# Patient Record
Sex: Female | Born: 1935 | Race: White | Hispanic: No | Smoking: Never smoker
Health system: Southern US, Community
[De-identification: ages and names within clinical notes are randomized; demographics above are authoritative.]

## PROBLEM LIST (undated history)

## (undated) DIAGNOSIS — I63113 Cerebral infarction due to embolism of bilateral vertebral arteries: Secondary | ICD-10-CM

## (undated) DIAGNOSIS — D649 Anemia, unspecified: Secondary | ICD-10-CM

## (undated) DIAGNOSIS — I639 Cerebral infarction, unspecified: Secondary | ICD-10-CM

## (undated) DIAGNOSIS — M109 Gout, unspecified: Secondary | ICD-10-CM

## (undated) DIAGNOSIS — H3530 Unspecified macular degeneration: Secondary | ICD-10-CM

## (undated) DIAGNOSIS — I10 Essential (primary) hypertension: Secondary | ICD-10-CM

## (undated) DIAGNOSIS — H409 Unspecified glaucoma: Secondary | ICD-10-CM

## (undated) DIAGNOSIS — M1990 Unspecified osteoarthritis, unspecified site: Secondary | ICD-10-CM

## (undated) DIAGNOSIS — H35039 Hypertensive retinopathy, unspecified eye: Secondary | ICD-10-CM

## (undated) DIAGNOSIS — I214 Non-ST elevation (NSTEMI) myocardial infarction: Secondary | ICD-10-CM

## (undated) HISTORY — PX: DILATION AND CURETTAGE OF UTERUS: SHX78

## (undated) HISTORY — PX: BREAST SURGERY: SHX581

## (undated) HISTORY — PX: EYE SURGERY: SHX253

## (undated) HISTORY — DX: Unspecified glaucoma: H40.9

## (undated) HISTORY — PX: HERNIA REPAIR: SHX51

## (undated) HISTORY — PX: JOINT REPLACEMENT: SHX530

## (undated) HISTORY — PX: ABDOMINAL HYSTERECTOMY: SHX81

## (undated) HISTORY — PX: CATARACT EXTRACTION: SUR2

## (undated) HISTORY — PX: IRIDOTOMY / IRIDECTOMY: SHX165

## (undated) HISTORY — DX: Hypertensive retinopathy, unspecified eye: H35.039

## (undated) HISTORY — DX: Unspecified macular degeneration: H35.30

---

## 1998-11-02 ENCOUNTER — Ambulatory Visit (HOSPITAL_COMMUNITY): Admission: RE | Admit: 1998-11-02 | Discharge: 1998-11-02 | Payer: Self-pay | Admitting: Family Medicine

## 1998-11-02 ENCOUNTER — Encounter: Payer: Self-pay | Admitting: Family Medicine

## 1998-11-09 ENCOUNTER — Ambulatory Visit (HOSPITAL_COMMUNITY): Admission: RE | Admit: 1998-11-09 | Discharge: 1998-11-09 | Payer: Self-pay | Admitting: Family Medicine

## 1998-11-13 ENCOUNTER — Other Ambulatory Visit: Admission: RE | Admit: 1998-11-13 | Discharge: 1998-11-13 | Payer: Self-pay | Admitting: Family Medicine

## 2000-01-27 ENCOUNTER — Encounter: Payer: Self-pay | Admitting: Family Medicine

## 2000-01-27 ENCOUNTER — Encounter: Admission: RE | Admit: 2000-01-27 | Discharge: 2000-01-27 | Payer: Self-pay | Admitting: Family Medicine

## 2000-02-15 ENCOUNTER — Other Ambulatory Visit: Admission: RE | Admit: 2000-02-15 | Discharge: 2000-02-15 | Payer: Self-pay | Admitting: Family Medicine

## 2001-03-30 ENCOUNTER — Encounter: Payer: Self-pay | Admitting: Family Medicine

## 2001-03-30 ENCOUNTER — Encounter: Admission: RE | Admit: 2001-03-30 | Discharge: 2001-03-30 | Payer: Self-pay | Admitting: Family Medicine

## 2001-04-27 ENCOUNTER — Encounter: Payer: Self-pay | Admitting: Family Medicine

## 2001-04-27 ENCOUNTER — Encounter: Admission: RE | Admit: 2001-04-27 | Discharge: 2001-04-27 | Payer: Self-pay | Admitting: Family Medicine

## 2002-06-14 ENCOUNTER — Encounter: Admission: RE | Admit: 2002-06-14 | Discharge: 2002-06-14 | Payer: Self-pay | Admitting: Family Medicine

## 2002-06-14 ENCOUNTER — Encounter: Payer: Self-pay | Admitting: Family Medicine

## 2003-05-30 ENCOUNTER — Other Ambulatory Visit: Admission: RE | Admit: 2003-05-30 | Discharge: 2003-05-30 | Payer: Self-pay | Admitting: Family Medicine

## 2004-06-22 ENCOUNTER — Encounter: Admission: RE | Admit: 2004-06-22 | Discharge: 2004-06-22 | Payer: Self-pay | Admitting: Family Medicine

## 2004-06-29 ENCOUNTER — Encounter (INDEPENDENT_AMBULATORY_CARE_PROVIDER_SITE_OTHER): Payer: Self-pay | Admitting: Specialist

## 2004-06-29 ENCOUNTER — Encounter: Admission: RE | Admit: 2004-06-29 | Discharge: 2004-06-29 | Payer: Self-pay | Admitting: Family Medicine

## 2004-12-31 ENCOUNTER — Encounter: Admission: RE | Admit: 2004-12-31 | Discharge: 2004-12-31 | Payer: Self-pay | Admitting: Family Medicine

## 2005-07-15 ENCOUNTER — Encounter: Admission: RE | Admit: 2005-07-15 | Discharge: 2005-07-15 | Payer: Self-pay | Admitting: Family Medicine

## 2006-08-01 ENCOUNTER — Encounter: Admission: RE | Admit: 2006-08-01 | Discharge: 2006-08-01 | Payer: Self-pay | Admitting: Family Medicine

## 2006-10-17 ENCOUNTER — Encounter: Admission: RE | Admit: 2006-10-17 | Discharge: 2006-10-17 | Payer: Self-pay | Admitting: Family Medicine

## 2007-02-15 ENCOUNTER — Encounter: Admission: RE | Admit: 2007-02-15 | Discharge: 2007-02-15 | Payer: Self-pay | Admitting: Family Medicine

## 2007-06-28 ENCOUNTER — Encounter: Admission: RE | Admit: 2007-06-28 | Discharge: 2007-06-28 | Payer: Self-pay | Admitting: Family Medicine

## 2007-08-07 ENCOUNTER — Encounter: Admission: RE | Admit: 2007-08-07 | Discharge: 2007-08-07 | Payer: Self-pay | Admitting: Family Medicine

## 2008-05-15 ENCOUNTER — Encounter: Admission: RE | Admit: 2008-05-15 | Discharge: 2008-05-15 | Payer: Self-pay | Admitting: Family Medicine

## 2008-08-07 ENCOUNTER — Encounter: Admission: RE | Admit: 2008-08-07 | Discharge: 2008-08-07 | Payer: Self-pay | Admitting: Family Medicine

## 2008-12-18 ENCOUNTER — Ambulatory Visit (HOSPITAL_BASED_OUTPATIENT_CLINIC_OR_DEPARTMENT_OTHER): Admission: RE | Admit: 2008-12-18 | Discharge: 2008-12-18 | Payer: Self-pay | Admitting: Orthopedic Surgery

## 2009-08-14 ENCOUNTER — Encounter: Admission: RE | Admit: 2009-08-14 | Discharge: 2009-08-14 | Payer: Self-pay | Admitting: Family Medicine

## 2010-03-29 ENCOUNTER — Inpatient Hospital Stay (HOSPITAL_COMMUNITY): Admission: RE | Admit: 2010-03-29 | Discharge: 2010-04-01 | Payer: Self-pay | Admitting: Orthopedic Surgery

## 2010-08-19 ENCOUNTER — Encounter: Admission: RE | Admit: 2010-08-19 | Discharge: 2010-08-19 | Payer: Self-pay | Admitting: Internal Medicine

## 2010-12-24 ENCOUNTER — Ambulatory Visit (HOSPITAL_BASED_OUTPATIENT_CLINIC_OR_DEPARTMENT_OTHER): Payer: Medicare Other | Attending: Cardiovascular Disease

## 2010-12-24 DIAGNOSIS — G4733 Obstructive sleep apnea (adult) (pediatric): Secondary | ICD-10-CM | POA: Insufficient documentation

## 2010-12-24 DIAGNOSIS — G4737 Central sleep apnea in conditions classified elsewhere: Secondary | ICD-10-CM | POA: Insufficient documentation

## 2010-12-26 LAB — BASIC METABOLIC PANEL
BUN: 16 mg/dL (ref 6–23)
BUN: 17 mg/dL (ref 6–23)
CO2: 26 mEq/L (ref 19–32)
Calcium: 8.4 mg/dL (ref 8.4–10.5)
Chloride: 107 mEq/L (ref 96–112)
Creatinine, Ser: 0.88 mg/dL (ref 0.4–1.2)
Creatinine, Ser: 0.92 mg/dL (ref 0.4–1.2)
GFR calc Af Amer: >60 mL/min (ref >60)
GFR calc Af Amer: >60 mL/min (ref >60)
GFR calc non Af Amer: 58 mL/min — ABNORMAL LOW (ref >60)
GFR calc non Af Amer: >60 mL/min (ref >60)
Sodium: 137 mEq/L (ref 135–145)

## 2010-12-26 LAB — CBC
HCT: 30.3 % — ABNORMAL LOW (ref 36.0–46.0)
Hemoglobin: 10.3 g/dL — ABNORMAL LOW (ref 12.0–15.0)
Hemoglobin: 9.8 g/dL — ABNORMAL LOW (ref 12.0–15.0)
MCHC: 32 g/dL (ref 30.0–36.0)
MCHC: 32.2 g/dL (ref 30.0–36.0)
MCV: 93.9 fL (ref 78.0–100.0)
Platelets: 194 10*3/uL (ref 150–400)
Platelets: 198 10*3/uL (ref 150–400)
Platelets: 205 10*3/uL (ref 150–400)
RDW: 13 % (ref 11.5–15.5)
RDW: 13.3 % (ref 11.5–15.5)
RDW: 13.3 % (ref 11.5–15.5)

## 2010-12-26 LAB — PROTIME-INR
INR: 1.15 (ref 0.00–1.49)
Prothrombin Time: 21.4 seconds — ABNORMAL HIGH (ref 11.6–15.2)

## 2010-12-27 LAB — COMPREHENSIVE METABOLIC PANEL
ALT: 11 U/L (ref 0–35)
Alkaline Phosphatase: 124 U/L — ABNORMAL HIGH (ref 39–117)
BUN: 23 mg/dL (ref 6–23)
CO2: 30 mEq/L (ref 19–32)
GFR calc non Af Amer: 55 mL/min — ABNORMAL LOW (ref >60)
Glucose, Bld: 81 mg/dL (ref 70–99)
Potassium: 3.9 mEq/L (ref 3.5–5.1)
Sodium: 140 mEq/L (ref 135–145)
Total Bilirubin: 0.7 mg/dL (ref 0.3–1.2)
Total Protein: 7.4 g/dL (ref 6.0–8.3)

## 2010-12-27 LAB — PROTIME-INR
INR: 1.03 (ref 0.00–1.49)
Prothrombin Time: 13.4 seconds (ref 11.6–15.2)

## 2010-12-27 LAB — URINALYSIS, ROUTINE W REFLEX MICROSCOPIC
Bilirubin Urine: NEGATIVE
Glucose, UA: NEGATIVE mg/dL
Hgb urine dipstick: NEGATIVE
Ketones, ur: NEGATIVE mg/dL
Protein, ur: NEGATIVE mg/dL
Urobilinogen, UA: 0.2 mg/dL (ref 0.0–1.0)

## 2010-12-27 LAB — CBC
HCT: 41.7 % (ref 36.0–46.0)
MCHC: 34.2 g/dL (ref 30.0–36.0)
Platelets: 248 10*3/uL (ref 150–400)
RDW: 12.8 % (ref 11.5–15.5)

## 2010-12-27 LAB — SURGICAL PCR SCREEN: Staphylococcus aureus: NEGATIVE

## 2011-01-01 DIAGNOSIS — R0609 Other forms of dyspnea: Secondary | ICD-10-CM

## 2011-01-01 DIAGNOSIS — R0989 Other specified symptoms and signs involving the circulatory and respiratory systems: Secondary | ICD-10-CM

## 2011-01-01 DIAGNOSIS — G4733 Obstructive sleep apnea (adult) (pediatric): Secondary | ICD-10-CM

## 2011-01-17 ENCOUNTER — Encounter (HOSPITAL_BASED_OUTPATIENT_CLINIC_OR_DEPARTMENT_OTHER): Payer: Self-pay

## 2011-01-20 LAB — POCT I-STAT 4, (NA,K, GLUC, HGB,HCT)
Glucose, Bld: 98 mg/dL (ref 70–99)
HCT: 46 % (ref 36.0–46.0)
Hemoglobin: 15.6 g/dL — ABNORMAL HIGH (ref 12.0–15.0)
Potassium: 3.9 mEq/L (ref 3.5–5.1)

## 2011-02-22 NOTE — Op Note (Signed)
[name], [name]                 ACCOUNT NO.:  [id number]   MEDICAL RECORD NO.:  [id number]          PATIENT TYPE:  AMB   LOCATION:  [facility]                         FACILITY:  [facility]   PHYSICIAN:  Ollen Gross, M.D.    DATE OF BIRTH:  1923-06-05   DATE OF PROCEDURE:  12/18/2008  DATE OF DISCHARGE:                               OPERATIVE REPORT   PREOPERATIVE DIAGNOSIS:  Left knee medial meniscal tear.   POSTOPERATIVE DIAGNOSIS:  Left knee medial meniscal tear.  Generalized  arthritis.   PROCEDURE:  Left knee arthroscopy with meniscal debridement.   SURGEON:  Dr. Lequita Halt, no assistant.   ANESTHESIA:  General.   ESTIMATED BLOOD LOSS:  Minimal.   DRAIN:  None.   COMPLICATIONS:  None.   CONDITION:  Stable to recovery.   BRIEF CLINICAL NOTE:  [name] is an 77 year old female with a long  history of progressively worsening left knee pain and mechanical  symptoms.  She had some arthritis in a joint but had a medial meniscal  tear and MRI as well as significant stress reaction.  We gave it 2  months for the stress reaction to heal, and it got better, but then her  pain has recurred with worsening mechanical symptoms.  She presents now  for arthroscopy and debridement.   PROCEDURE IN DETAIL:  After successful administration of general  anesthetic, a tourniquet was placed high on the left thigh and left  lower extremity prepped and draped in the usual sterile fashion.  Standard superomedial and inferolateral incisions were made with the  inflow cannula passed superomedial, camera passed inferolateral.  Arthroscopic visualization proceeds.  Undersurface patella shows grade 2  change centrally and medially with grade 4 change laterally on the  patella and the trochlea.  Medial and lateral gutters were visualized  with no loose bodies.  Flexion valgus applied to the knee and the medial  compartment was entered.  She has exposed bone on the medial femoral  condyle and medial tibial  plateau with surrounding grade 3  chondromalacia.  There was no unstable cartilage.  She does have a tear  in the body and posterior horn of the medial meniscus.  A spinal needle  was used to localize the inferomedial portal, small incision made,  dilator placed and I debrided the meniscus back to a stable base with  baskets and a 4.2-mm shaver.  We debrided some hypertrophic synovium in  the intercondylar area as well as suprapatellar area with the  ArthroCare.  Intercondylar notch was visualized.  The ACL was normal.  Lateral compartment was entered and it looks normal.  I further debrided  the hypertrophic tissue superiorly in the suprapatellar pouch.  The  joint was again inspected and no tears or loose bodies noted.  Arthroscopic equipment was  removed from the inferior portals which closed with interrupted 4-0  nylon.  Twenty mL of 0.25% Marcaine with epinephrine injected through  the inflow cannula, and that is removed and that portal closed with  nylon.  Bulky sterile dressing was applied, and she is awakened and  transferred to  recovery in stable condition.      Ollen Gross, M.D.  Electronically Signed     FA/MEDQ  D:  12/18/2008  T:  12/18/2008  Job:  [id number]

## 2011-07-12 ENCOUNTER — Other Ambulatory Visit: Payer: Self-pay | Admitting: Internal Medicine

## 2011-07-12 DIAGNOSIS — Z1231 Encounter for screening mammogram for malignant neoplasm of breast: Secondary | ICD-10-CM

## 2011-08-23 ENCOUNTER — Ambulatory Visit
Admission: RE | Admit: 2011-08-23 | Discharge: 2011-08-23 | Disposition: A | Discharge status: Home / Self Care | Payer: Medicare Other | Source: Ambulatory Visit | Attending: Internal Medicine | Admitting: Internal Medicine

## 2011-08-23 DIAGNOSIS — Z1231 Encounter for screening mammogram for malignant neoplasm of breast: Secondary | ICD-10-CM

## 2012-08-16 ENCOUNTER — Other Ambulatory Visit: Payer: Self-pay | Admitting: Internal Medicine

## 2012-08-16 DIAGNOSIS — Z1231 Encounter for screening mammogram for malignant neoplasm of breast: Secondary | ICD-10-CM

## 2012-09-21 ENCOUNTER — Emergency Department (HOSPITAL_COMMUNITY): Payer: Medicare Other

## 2012-09-21 ENCOUNTER — Emergency Department (HOSPITAL_COMMUNITY)
Admission: EM | Admit: 2012-09-21 | Discharge: 2012-09-21 | Disposition: A | Discharge status: Home / Self Care | Payer: Medicare Other | Attending: Emergency Medicine | Admitting: Emergency Medicine

## 2012-09-21 DIAGNOSIS — S99929A Unspecified injury of unspecified foot, initial encounter: Secondary | ICD-10-CM | POA: Insufficient documentation

## 2012-09-21 DIAGNOSIS — R29810 Facial weakness: Secondary | ICD-10-CM | POA: Insufficient documentation

## 2012-09-21 DIAGNOSIS — Y939 Activity, unspecified: Secondary | ICD-10-CM | POA: Insufficient documentation

## 2012-09-21 DIAGNOSIS — S8990XA Unspecified injury of unspecified lower leg, initial encounter: Secondary | ICD-10-CM | POA: Insufficient documentation

## 2012-09-21 DIAGNOSIS — S0010XA Contusion of unspecified eyelid and periocular area, initial encounter: Secondary | ICD-10-CM | POA: Insufficient documentation

## 2012-09-21 DIAGNOSIS — R269 Unspecified abnormalities of gait and mobility: Secondary | ICD-10-CM | POA: Insufficient documentation

## 2012-09-21 DIAGNOSIS — W19XXXA Unspecified fall, initial encounter: Secondary | ICD-10-CM

## 2012-09-21 DIAGNOSIS — Z79899 Other long term (current) drug therapy: Secondary | ICD-10-CM | POA: Insufficient documentation

## 2012-09-21 DIAGNOSIS — Y929 Unspecified place or not applicable: Secondary | ICD-10-CM | POA: Insufficient documentation

## 2012-09-21 DIAGNOSIS — Z7982 Long term (current) use of aspirin: Secondary | ICD-10-CM | POA: Insufficient documentation

## 2012-09-21 DIAGNOSIS — I639 Cerebral infarction, unspecified: Secondary | ICD-10-CM

## 2012-09-21 DIAGNOSIS — N390 Urinary tract infection, site not specified: Secondary | ICD-10-CM | POA: Insufficient documentation

## 2012-09-21 DIAGNOSIS — W1789XA Other fall from one level to another, initial encounter: Secondary | ICD-10-CM | POA: Insufficient documentation

## 2012-09-21 LAB — CBC WITH DIFFERENTIAL/PLATELET
Basophils Absolute: 0 10*3/uL (ref 0.0–0.1)
Eosinophils Relative: 0 % (ref 0–5)
Lymphocytes Relative: 22 % (ref 12–46)
MCV: 89.2 fL (ref 78.0–100.0)
Neutrophils Relative %: 72 % (ref 43–77)
Platelets: 329 10*3/uL (ref 150–400)
RBC: 4.71 MIL/uL (ref 3.87–5.11)
RDW: 13.4 % (ref 11.5–15.5)
WBC: 12 10*3/uL — ABNORMAL HIGH (ref 4.0–10.5)

## 2012-09-21 LAB — URINALYSIS, ROUTINE W REFLEX MICROSCOPIC
Bilirubin Urine: NEGATIVE
Hgb urine dipstick: NEGATIVE
Specific Gravity, Urine: 1.021 (ref 1.005–1.030)
pH: 5 (ref 5.0–8.0)

## 2012-09-21 LAB — COMPREHENSIVE METABOLIC PANEL
ALT: 13 U/L (ref 0–35)
AST: 24 U/L (ref 0–37)
Alkaline Phosphatase: 101 U/L (ref 39–117)
CO2: 25 mEq/L (ref 19–32)
Calcium: 9.9 mg/dL (ref 8.4–10.5)
GFR calc non Af Amer: 40 mL/min — ABNORMAL LOW (ref >90)
Potassium: 4.1 mEq/L (ref 3.5–5.1)
Sodium: 139 mEq/L (ref 135–145)
Total Protein: 7.3 g/dL (ref 6.0–8.3)

## 2012-09-21 LAB — URINE MICROSCOPIC-ADD ON

## 2012-09-21 MED ORDER — SULFAMETHOXAZOLE-TRIMETHOPRIM 800-160 MG PO TABS: 1.0000 | ORAL_TABLET | Freq: Two times a day (BID) | ORAL | Status: DC

## 2012-09-21 MED ORDER — SULFAMETHOXAZOLE-TMP DS 800-160 MG PO TABS
1.0000 | ORAL_TABLET | Freq: Once | ORAL | Status: AC
  Administered 2012-09-21: 1 via ORAL
  Filled 2012-09-21: qty 1

## 2012-09-21 NOTE — ED Provider Notes (Signed)
History     CSN: [id number]  Arrival date & time 09/21/12  1132   First MD Initiated Contact with Patient 09/21/12 1236      No chief complaint on file.   (Consider location/radiation/quality/duration/timing/severity/associated sxs/prior treatment) HPI The patient presents after a series of falls.  Prior to these episodes she was in her usual state of health, functional.  Over the past 3 days she's had several falls.  She recalls all events, denies any prodrome during these episodes.  She states that she lost her balance several times.  Prominently, 2 days ago, she landed on her face.  She subsequently fell on her backside and the left knee.  Currently she complains of pain in her left knee, denies other focal pain, disorientation, confusion, weakness.  The patient's daughter provides much of the history of present illness. The family reports concern of possible left-sided facial droop.  The patient herself denies dysarthria, ataxia, weakness.  No past medical history on file.  No past surgical history on file.  No family history on file.  History  Substance Use Topics  . Smoking status: Not on file  . Smokeless tobacco: Not on file  . Alcohol Use: Not on file    OB History    No data available      Review of Systems  Constitutional:       Per HPI, otherwise negative  HENT:       Per HPI, otherwise negative  Eyes: Negative.   Respiratory:       Per HPI, otherwise negative  Cardiovascular:       Per HPI, otherwise negative  Gastrointestinal: Negative for nausea and vomiting.  Genitourinary: Negative.   Musculoskeletal:       Per HPI, otherwise negative  Skin: Positive for wound.  Neurological: Negative for syncope, weakness, light-headedness and headaches.    Allergies  Review of patient's allergies indicates no known allergies.  Home Medications   Current Outpatient Rx  Name  Route  Sig  Dispense  Refill  . ALENDRONATE SODIUM 70 MG PO TABS   Oral   Take  70 mg by mouth every 7 (seven) days. Take with a full glass of water on an empty stomach. On Mondays         . ASPIRIN 81 MG PO TABS   Oral   Take 81 mg by mouth daily.         Marland Kitchen CALTRATE 600+D PO   Oral   Take 1 tablet by mouth 2 (two) times daily.         . FUROSEMIDE 40 MG PO TABS   Oral   Take 40 mg by mouth daily.         Marland Kitchen LOSARTAN POTASSIUM 100 MG PO TABS   Oral   Take 100 mg by mouth daily.         Marland Kitchen METOPROLOL TARTRATE 50 MG PO TABS   Oral   Take 25 mg by mouth 2 (two) times daily.         Frazier Butt OP   Ophthalmic   Apply 1 drop to eye 2 (two) times daily.         Marland Kitchen SIMVASTATIN 20 MG PO TABS   Oral   Take 20 mg by mouth every evening.         . TRIAMCINOLONE ACETONIDE 0.1 % EX CREA   Topical   Apply 1 application topically daily as needed. For rash  BP 136/77  Pulse 73  Temp 98.7 F (37.1 C)  Resp 16  SpO2 99%  Physical Exam  Nursing note and vitals reviewed. Constitutional: She is oriented to person, place, and time. She appears well-developed and well-nourished. No distress.  HENT:  Head: Normocephalic and atraumatic.    Eyes: Conjunctivae normal and EOM are normal. Pupils are equal, round, and reactive to light.  Neck: No spinous process tenderness and no muscular tenderness present. No erythema and normal range of motion present.  Cardiovascular: Normal rate and regular rhythm.   Pulmonary/Chest: Effort normal and breath sounds normal. No stridor. No respiratory distress.  Abdominal: She exhibits no distension.  Musculoskeletal: She exhibits no edema.       Legs: Neurological: She is alert and oriented to person, place, and time. No cranial nerve deficit.  Skin: Skin is warm and dry.  Psychiatric: She has a normal mood and affect.    ED Course  Procedures (including critical care time)   Labs Reviewed  CBC WITH DIFFERENTIAL  COMPREHENSIVE METABOLIC PANEL  URINALYSIS, ROUTINE W REFLEX MICROSCOPIC   Ct Head  Wo Contrast  09/21/2012  *RADIOLOGY REPORT*  Clinical Data:  Injury after fall.  CT HEAD WITHOUT CONTRAST CT MAXILLOFACIAL WITHOUT CONTRAST  Technique:  Multidetector CT imaging of the head and maxillofacial structures were performed using the standard protocol without intravenous contrast. Multiplanar CT image reconstructions of the maxillofacial structures were also generated.  Comparison:   None.  CT HEAD  Findings: Bony calvarium appears intact.  Mild diffuse cortical atrophy is noted. Low density is seen involving right periventricular white matter most consistent with old infarction. No mass effect or midline shift is noted.  Ventricular size is within normal limits. There is no evidence of mass lesion, hemorrhage or acute infarction.  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  No acute intracranial abnormality seen.  CT MAXILLOFACIAL  Findings:   No fracture or other bony abnormality is noted. Paranasal sinuses appear normal.  Globes and orbits appear normal.  IMPRESSION: No abnormalities seen in the maxillofacial region.   Original Report Authenticated By: Lupita Raider.,  M.D.    Ct Maxillofacial Wo Cm  09/21/2012  *RADIOLOGY REPORT*  Clinical Data:  Injury after fall.  CT HEAD WITHOUT CONTRAST CT MAXILLOFACIAL WITHOUT CONTRAST  Technique:  Multidetector CT imaging of the head and maxillofacial structures were performed using the standard protocol without intravenous contrast. Multiplanar CT image reconstructions of the maxillofacial structures were also generated.  Comparison:   None.  CT HEAD  Findings: Bony calvarium appears intact.  Mild diffuse cortical atrophy is noted. Low density is seen involving right periventricular white matter most consistent with old infarction. No mass effect or midline shift is noted.  Ventricular size is within normal limits. There is no evidence of mass lesion, hemorrhage or acute infarction.  IMPRESSION: Probable old  infarction involving right periventricular white matter.  No acute intracranial abnormality seen.  CT MAXILLOFACIAL  Findings:   No fracture or other bony abnormality is noted. Paranasal sinuses appear normal.  Globes and orbits appear normal.  IMPRESSION: No abnormalities seen in the maxillofacial region.   Original Report Authenticated By: Lupita Raider.,  M.D.      No diagnosis found.  O2- 99%ra, normal  Cardiac:75sr, normal   Date: 09/21/2012  Rate: 70  Rhythm: normal sinus rhythm  QRS Axis: normal  Intervals: normal  ST/T Wave abnormalities: normal  Conduction Disutrbances:none  Narrative Interpretation:   Old EKG  Reviewed: unchanged Minimal changes - unremarkable ecg  4:15 PM On discussing today's results, the patient's family members requested in evaluation of questionable new left facial droop.  They had this has been present for 3 days.  The patient has minimal asymmetry of her face.  We discussed possible causes, including stroke, facial neuropathy, diaskesis.  The patient requested a discharge with additional evaluation to occur as an outpatient.   MDM  This elderly female presents after a series of falls.  On exam she is in no distress.  The patient is neurologically intact aside from a minor left facial droop.  Given her falls, or facial trauma she had radiographic studies.  These were largely reassuring.  The patient's labs demonstrate evidence of a urinary tract infection.  We discussed this, the need for additional consideration of her possible left facial droop.  Acknowledging that this may occurred after prior infarct, recommended MRI.  The patient elected to do this as an outpatient.  In an effort to empirically treat for possible stroke, we increased her aspirin from 81-325 mg.  Absent distress, and with the patient's request to depart, she was discharged        Gerhard Munch, MD 09/21/12 [phone]

## 2012-09-21 NOTE — ED Notes (Signed)
Pt moved from bed into wheelchair, changed into a gown.  No bruising noted to pts back.  Noted to have a bruised (L) eye and forehead.  Reports falling 4-5 times since Wednesday.  Reports that she lost her balance.  Denies loosing consciousness during any of the falls.  Family reports speech has been slurred and she has been disorientated since [place] today.

## 2012-09-21 NOTE — ED Notes (Signed)
Bumped her head several times she states has left facial droop all since wed

## 2012-09-21 NOTE — ED Notes (Signed)
Fell on wed hit her left eye states lost her balance. Took a trip on Thursday friends thinks she was slurring her speech then and trouble lifting left knee.. Seemed disoriented  To friends in Tuesday. Had gout rt knee last week

## 2012-09-24 LAB — URINE CULTURE

## 2012-09-25 ENCOUNTER — Other Ambulatory Visit: Payer: Self-pay | Admitting: Internal Medicine

## 2012-09-25 ENCOUNTER — Inpatient Hospital Stay (HOSPITAL_COMMUNITY)
Admission: EM | Admit: 2012-09-25 | Discharge: 2012-09-28 | DRG: 065 | Disposition: A | Discharge status: Home Health | Payer: Medicare Other | Attending: Internal Medicine | Admitting: Internal Medicine

## 2012-09-25 ENCOUNTER — Encounter (HOSPITAL_COMMUNITY): Payer: Self-pay | Admitting: Physical Medicine and Rehabilitation

## 2012-09-25 ENCOUNTER — Ambulatory Visit
Admission: RE | Admit: 2012-09-25 | Discharge: 2012-09-25 | Disposition: A | Discharge status: Home / Self Care | Payer: Medicare Other | Source: Ambulatory Visit | Attending: Internal Medicine | Admitting: Internal Medicine

## 2012-09-25 DIAGNOSIS — N289 Disorder of kidney and ureter, unspecified: Secondary | ICD-10-CM | POA: Diagnosis present

## 2012-09-25 DIAGNOSIS — S0010XA Contusion of unspecified eyelid and periocular area, initial encounter: Secondary | ICD-10-CM | POA: Diagnosis present

## 2012-09-25 DIAGNOSIS — Z7982 Long term (current) use of aspirin: Secondary | ICD-10-CM

## 2012-09-25 DIAGNOSIS — N390 Urinary tract infection, site not specified: Secondary | ICD-10-CM | POA: Diagnosis present

## 2012-09-25 DIAGNOSIS — Z23 Encounter for immunization: Secondary | ICD-10-CM

## 2012-09-25 DIAGNOSIS — Y92009 Unspecified place in unspecified non-institutional (private) residence as the place of occurrence of the external cause: Secondary | ICD-10-CM

## 2012-09-25 DIAGNOSIS — Z79899 Other long term (current) drug therapy: Secondary | ICD-10-CM

## 2012-09-25 DIAGNOSIS — S0003XA Contusion of scalp, initial encounter: Secondary | ICD-10-CM | POA: Diagnosis present

## 2012-09-25 DIAGNOSIS — I6330 Cerebral infarction due to thrombosis of unspecified cerebral artery: Principal | ICD-10-CM | POA: Diagnosis present

## 2012-09-25 DIAGNOSIS — M109 Gout, unspecified: Secondary | ICD-10-CM | POA: Diagnosis present

## 2012-09-25 DIAGNOSIS — R29810 Facial weakness: Secondary | ICD-10-CM

## 2012-09-25 DIAGNOSIS — I639 Cerebral infarction, unspecified: Secondary | ICD-10-CM

## 2012-09-25 DIAGNOSIS — E785 Hyperlipidemia, unspecified: Secondary | ICD-10-CM | POA: Diagnosis present

## 2012-09-25 DIAGNOSIS — W19XXXA Unspecified fall, initial encounter: Secondary | ICD-10-CM | POA: Diagnosis present

## 2012-09-25 DIAGNOSIS — M129 Arthropathy, unspecified: Secondary | ICD-10-CM | POA: Diagnosis present

## 2012-09-25 DIAGNOSIS — IMO0002 Reserved for concepts with insufficient information to code with codable children: Secondary | ICD-10-CM

## 2012-09-25 DIAGNOSIS — Z8673 Personal history of transient ischemic attack (TIA), and cerebral infarction without residual deficits: Secondary | ICD-10-CM | POA: Diagnosis present

## 2012-09-25 DIAGNOSIS — S1093XA Contusion of unspecified part of neck, initial encounter: Secondary | ICD-10-CM | POA: Diagnosis present

## 2012-09-25 DIAGNOSIS — I672 Cerebral atherosclerosis: Secondary | ICD-10-CM | POA: Diagnosis present

## 2012-09-25 DIAGNOSIS — E875 Hyperkalemia: Secondary | ICD-10-CM | POA: Diagnosis present

## 2012-09-25 DIAGNOSIS — I10 Essential (primary) hypertension: Secondary | ICD-10-CM | POA: Diagnosis present

## 2012-09-25 HISTORY — DX: Essential (primary) hypertension: I10

## 2012-09-25 HISTORY — DX: Cerebral infarction, unspecified: I63.9

## 2012-09-25 HISTORY — DX: Anemia, unspecified: D64.9

## 2012-09-25 HISTORY — DX: Unspecified osteoarthritis, unspecified site: M19.90

## 2012-09-25 HISTORY — DX: Gout, unspecified: M10.9

## 2012-09-25 MED ORDER — SODIUM CHLORIDE 0.9 % IV SOLN
INTRAVENOUS | Status: DC
  Administered 2012-09-26: via INTRAVENOUS

## 2012-09-25 NOTE — ED Notes (Signed)
Pt A.O.x4. NAD. Respirations even and regular vitals stable. Was seen here at [facility] on Friday post fall for stroke like symptoms. Purple and blue discoloration around left eye related to fall on Friday.  Pt reports a second fall on Monday at home. States she fell on her knees. Denies injury to head. Reports mild bilateral knee pain. Left worse than the right related to fall yesterday. Reports having MRI at [facility] imaging done today. Daughters at bedside. NAD.

## 2012-09-25 NOTE — ED Notes (Signed)
Spoke with Dr. Judd Lien about possible further orders, none at the time. Will continue to monitor patient.

## 2012-09-25 NOTE — ED Notes (Addendum)
Pt presents to department for evaluation of stroke. States she fell last Wednesday, was seen for fall here on Friday, discharged home with follow up appointment for MRI. States she had MRI done today, shows acute/subacute infarct. Pt denies pain at the time. Strong equal grip strengths bilaterally, no slurred speech, ambulatory to triage. She is alert and oriented x4. No acute signs of distress noted.

## 2012-09-26 ENCOUNTER — Encounter (HOSPITAL_COMMUNITY): Payer: Self-pay | Admitting: *Deleted

## 2012-09-26 ENCOUNTER — Ambulatory Visit: Payer: Medicare Other

## 2012-09-26 DIAGNOSIS — W19XXXA Unspecified fall, initial encounter: Secondary | ICD-10-CM

## 2012-09-26 DIAGNOSIS — I639 Cerebral infarction, unspecified: Secondary | ICD-10-CM | POA: Diagnosis present

## 2012-09-26 DIAGNOSIS — I6789 Other cerebrovascular disease: Secondary | ICD-10-CM

## 2012-09-26 DIAGNOSIS — I69992 Facial weakness following unspecified cerebrovascular disease: Secondary | ICD-10-CM

## 2012-09-26 DIAGNOSIS — E875 Hyperkalemia: Secondary | ICD-10-CM | POA: Diagnosis present

## 2012-09-26 DIAGNOSIS — N390 Urinary tract infection, site not specified: Secondary | ICD-10-CM | POA: Diagnosis present

## 2012-09-26 DIAGNOSIS — Z8673 Personal history of transient ischemic attack (TIA), and cerebral infarction without residual deficits: Secondary | ICD-10-CM | POA: Diagnosis present

## 2012-09-26 DIAGNOSIS — Y92009 Unspecified place in unspecified non-institutional (private) residence as the place of occurrence of the external cause: Secondary | ICD-10-CM

## 2012-09-26 DIAGNOSIS — I6350 Cerebral infarction due to unspecified occlusion or stenosis of unspecified cerebral artery: Secondary | ICD-10-CM

## 2012-09-26 DIAGNOSIS — IMO0002 Reserved for concepts with insufficient information to code with codable children: Secondary | ICD-10-CM

## 2012-09-26 DIAGNOSIS — I10 Essential (primary) hypertension: Secondary | ICD-10-CM | POA: Diagnosis present

## 2012-09-26 HISTORY — DX: Unspecified place in unspecified non-institutional (private) residence as the place of occurrence of the external cause: Y92.009

## 2012-09-26 HISTORY — DX: Unspecified place in unspecified non-institutional (private) residence as the place of occurrence of the external cause: W19.XXXA

## 2012-09-26 LAB — LIPID PANEL
Cholesterol: 169 mg/dL (ref 0–200)
Triglycerides: 131 mg/dL (ref <150)
VLDL: 26 mg/dL (ref 0–40)

## 2012-09-26 LAB — CBC
HCT: 36.9 % (ref 36.0–46.0)
Platelets: 284 10*3/uL (ref 150–400)
RDW: 13.7 % (ref 11.5–15.5)
WBC: 10 10*3/uL (ref 4.0–10.5)

## 2012-09-26 LAB — POCT I-STAT, CHEM 8
BUN: 31 mg/dL — ABNORMAL HIGH (ref 6–23)
Calcium, Ion: 1.23 mmol/L (ref 1.13–1.30)
Creatinine, Ser: 1.5 mg/dL — ABNORMAL HIGH (ref 0.50–1.10)
Glucose, Bld: 111 mg/dL — ABNORMAL HIGH (ref 70–99)
Hemoglobin: 12.6 g/dL (ref 12.0–15.0)
TCO2: 21 mmol/L (ref 0–100)

## 2012-09-26 LAB — COMPREHENSIVE METABOLIC PANEL
ALT: 12 U/L (ref 0–35)
AST: 20 U/L (ref 0–37)
Albumin: 3.4 g/dL — ABNORMAL LOW (ref 3.5–5.2)
Alkaline Phosphatase: 81 U/L (ref 39–117)
BUN: 29 mg/dL — ABNORMAL HIGH (ref 6–23)
Chloride: 106 mEq/L (ref 96–112)
Potassium: 5.3 mEq/L — ABNORMAL HIGH (ref 3.5–5.1)
Sodium: 138 mEq/L (ref 135–145)
Total Bilirubin: 0.2 mg/dL — ABNORMAL LOW (ref 0.3–1.2)

## 2012-09-26 LAB — HEMOGLOBIN A1C
Hgb A1c MFr Bld: 6.3 % — ABNORMAL HIGH (ref <5.7)
Mean Plasma Glucose: 134 mg/dL — ABNORMAL HIGH (ref <117)

## 2012-09-26 MED ORDER — SODIUM CHLORIDE 0.9 % IV SOLN: 250.0000 mL | INTRAVENOUS | Status: DC | PRN

## 2012-09-26 MED ORDER — METOPROLOL TARTRATE 25 MG PO TABS: 25.0000 mg | ORAL_TABLET | Freq: Two times a day (BID) | ORAL | Status: DC

## 2012-09-26 MED ORDER — SODIUM CHLORIDE 0.9 % IJ SOLN
3.0000 mL | Freq: Two times a day (BID) | INTRAMUSCULAR | Status: DC
  Administered 2012-09-26 – 2012-09-28 (×5): 3 mL via INTRAVENOUS

## 2012-09-26 MED ORDER — ACETAMINOPHEN 325 MG PO TABS
650.0000 mg | ORAL_TABLET | Freq: Four times a day (QID) | ORAL | Status: DC | PRN
  Administered 2012-09-26: 650 mg via ORAL
  Filled 2012-09-26: qty 2
  Filled 2012-09-26: qty 1

## 2012-09-26 MED ORDER — METOPROLOL TARTRATE 25 MG PO TABS
25.0000 mg | ORAL_TABLET | Freq: Two times a day (BID) | ORAL | Status: DC
  Administered 2012-09-27 – 2012-09-28 (×3): 25 mg via ORAL
  Filled 2012-09-26 (×5): qty 1

## 2012-09-26 MED ORDER — LOSARTAN POTASSIUM 50 MG PO TABS: 100.0000 mg | ORAL_TABLET | Freq: Every day | ORAL | Status: DC

## 2012-09-26 MED ORDER — SODIUM CHLORIDE 0.9 % IV SOLN
250.0000 mL | INTRAVENOUS | Status: DC | PRN
  Administered 2012-09-26: 1000 mL via INTRAVENOUS

## 2012-09-26 MED ORDER — METOPROLOL TARTRATE 25 MG PO TABS
25.0000 mg | ORAL_TABLET | Freq: Two times a day (BID) | ORAL | Status: DC
  Administered 2012-09-26: 25 mg via ORAL
  Filled 2012-09-26 (×2): qty 1

## 2012-09-26 MED ORDER — LOSARTAN POTASSIUM 50 MG PO TABS
100.0000 mg | ORAL_TABLET | Freq: Every day | ORAL | Status: DC
  Administered 2012-09-26 – 2012-09-28 (×3): 100 mg via ORAL
  Filled 2012-09-26 (×3): qty 2

## 2012-09-26 MED ORDER — SULFAMETHOXAZOLE-TRIMETHOPRIM 800-160 MG PO TABS: 1.0000 | ORAL_TABLET | Freq: Two times a day (BID) | ORAL | Status: DC

## 2012-09-26 MED ORDER — DEXTROSE 5 % IV SOLN
1.0000 g | Freq: Every day | INTRAVENOUS | Status: DC
  Administered 2012-09-26 – 2012-09-28 (×3): 1 g via INTRAVENOUS
  Filled 2012-09-26 (×4): qty 10

## 2012-09-26 MED ORDER — OXYCODONE HCL 5 MG PO TABS: 5.0000 mg | ORAL_TABLET | Freq: Four times a day (QID) | ORAL | Status: DC | PRN

## 2012-09-26 MED ORDER — SIMVASTATIN 20 MG PO TABS
20.0000 mg | ORAL_TABLET | Freq: Every evening | ORAL | Status: DC
  Administered 2012-09-26 – 2012-09-27 (×2): 20 mg via ORAL
  Filled 2012-09-26 (×3): qty 1

## 2012-09-26 MED ORDER — ASPIRIN 300 MG RE SUPP
300.0000 mg | Freq: Every day | RECTAL | Status: DC
  Filled 2012-09-26 (×3): qty 1

## 2012-09-26 MED ORDER — SULFAMETHOXAZOLE-TMP DS 800-160 MG PO TABS
1.0000 | ORAL_TABLET | Freq: Two times a day (BID) | ORAL | Status: DC
Start: 2012-09-26 — End: 2012-09-26
  Filled 2012-09-26 (×3): qty 1

## 2012-09-26 MED ORDER — ASPIRIN 325 MG PO TABS
325.0000 mg | ORAL_TABLET | Freq: Every day | ORAL | Status: DC
  Administered 2012-09-26 – 2012-09-28 (×3): 325 mg via ORAL
  Filled 2012-09-26 (×3): qty 1

## 2012-09-26 MED ORDER — METOPROLOL TARTRATE 1 MG/ML IV SOLN
2.5000 mg | Freq: Four times a day (QID) | INTRAVENOUS | Status: DC
  Administered 2012-09-26 (×2): 2.5 mg via INTRAVENOUS
  Filled 2012-09-26 (×5): qty 5

## 2012-09-26 MED ORDER — ASPIRIN EC 325 MG PO TBEC: 325.0000 mg | DELAYED_RELEASE_TABLET | Freq: Every day | ORAL | Status: DC

## 2012-09-26 MED ORDER — METOPROLOL TARTRATE 25 MG PO TABS
25.0000 mg | ORAL_TABLET | Freq: Two times a day (BID) | ORAL | Status: DC
  Filled 2012-09-26 (×3): qty 1

## 2012-09-26 MED ORDER — SODIUM CHLORIDE 0.9 % IJ SOLN: 3.0000 mL | INTRAMUSCULAR | Status: DC | PRN

## 2012-09-26 NOTE — Progress Notes (Signed)
[name],PT Acute Rehabilitation [phone] (pager)  

## 2012-09-26 NOTE — Progress Notes (Signed)
  Echocardiogram 2D Echocardiogram has been performed.  [name] [name] 09/26/2012, 12:38 PM

## 2012-09-26 NOTE — Evaluation (Signed)
Physical Therapy Evaluation Patient Details Name: [name] MRN: [id number] DOB: 23-May-1923 Today's Date: 09/26/2012 Time: 5284-1324 PT Time Calculation (min): 30 min  PT Assessment / Plan / Recommendation Clinical Impression  Pt admitted with R CVA, presents with left sided weakness.  Her decr attention to her left demonstrates incr difficulties with mobiltiy, balance, and activity tolerance.  She will benefit from skilled therapy services.  Discussed d/c options with pt and pt's daughter who agree on HHPT.  Will follow in acute setting to address her limitations.     PT Assessment  Patient needs continued PT services    Follow Up Recommendations  Home health PT          Equipment Recommendations  Rolling walker with 5" wheels       Frequency Min 3X/week    Precautions / Restrictions Precautions Precautions: Fall Precaution Comments: 4 falls in last week; 1 additional fall in past year Restrictions Weight Bearing Restrictions: No   Pertinent Vitals/Pain VSS, no pain.       Mobility  Bed Mobility Bed Mobility: Supine to Sit;Sitting - Scoot to Edge of Bed Supine to Sit: 4: Min assist;With rails;HOB elevated Sitting - Scoot to [place] of Bed: 5: Supervision Details for Bed Mobility Assistance: incr time, req min A for completing supine to sit Transfers Transfers: Sit to Stand;Stand to Sit Sit to Stand: 4: Min guard Stand to Sit: 4: Min guard Details for Transfer Assistance: moves quickly; unsteady initially Ambulation/Gait Ambulation/Gait Assistance: 4: Min Environmental consultant (Feet): 400 Feet Assistive device: Rolling walker Ambulation/Gait Assistance Details: vc for RW use and safety.  Pt decr ambulation with head turns and veered to left only upon returning to room.  Gait Pattern: Step-through pattern;Decreased stride length;Trunk flexed Gait velocity: decr compared to baseline, however pt was quick (RW helped to decr speed and improve balance and  safety Stairs: No Wheelchair Mobility Wheelchair Mobility: No Modified Rankin (Stroke Patients Only) Pre-Morbid Rankin Score: No significant disability Modified Rankin: Moderately severe disability           PT Diagnosis: Difficulty walking  PT Problem List: Decreased strength;Decreased range of motion;Decreased activity tolerance;Decreased balance;Decreased mobility;Decreased coordination;Decreased cognition;Decreased knowledge of use of DME;Decreased safety awareness PT Treatment Interventions: DME instruction;Gait training;Stair training;Functional mobility training;Therapeutic activities;Therapeutic exercise;Balance training;Neuromuscular re-education   PT Goals Acute Rehab PT Goals PT Goal Formulation: With patient Time For Goal Achievement: 09/26/12 Potential to Achieve Goals: Good Pt will go Supine/Side to Sit: Independently PT Goal: Supine/Side to Sit - Progress: Goal set today Pt will go Sit to Stand: Independently PT Goal: Sit to Stand - Progress: Goal set today Pt will go Stand to Sit: Independently PT Goal: Stand to Sit - Progress: Goal set today Pt will Transfer Bed to Chair/Chair to Bed: with modified independence PT Transfer Goal: Bed to Chair/Chair to Bed - Progress: Goal set today Pt will Stand: Independently PT Goal: Stand - Progress: Goal set today Pt will Ambulate: >150 feet;with modified independence;with least restrictive assistive device PT Goal: Ambulate - Progress: Goal set today Pt will Go Up / Down Stairs: 3-5 stairs;with modified independence;with rail(s) PT Goal: Up/Down Stairs - Progress: Goal set today  Visit Information  Last PT Received On: 09/26/12 Assistance Needed: +1    Subjective Data  Subjective: I'm tired, I didn't sleep much last night Patient Stated Goal: To return home   Prior Functioning  Home Living Lives With: Alone Available Help at Discharge: Family;Available 24 hours/day Type of Home: House Home Access:  Stairs to  enter [employer] of Steps: 3 Entrance Stairs-Rails: Can reach both Home Layout: One level Bathroom Shower/Tub: Engineer, manufacturing systems: Standard Home Adaptive Equipment: Bedside commode/3-in-1 Prior Function Level of Independence: Independent Able to Take Stairs?: Yes Driving: Yes Vocation: Retired Comments: Per pt's daughter: pt started needing assistance with fully dressing this past week but was still cooking and doing everything else. Communication Communication: No difficulties Dominant Hand: Right    Cognition  Overall Cognitive Status: Impaired Area of Impairment: Safety/judgement;Awareness of errors;Awareness of deficits Arousal/Alertness: Awake/alert Orientation Level: Oriented X4 / Intact Behavior During Session: [employer] for tasks performed Safety/Judgement: Decreased safety judgement for tasks assessed Safety/Judgement - Other Comments: pt moves quickly and has decr awareness of left sided weakness Awareness of Errors: Assistance required to identify errors made;Assistance required to correct errors made Awareness of Errors - Other Comments: pt did well ambulating in hallway, however veered left toward wall when entering room and fell toward left side when standing from bed to turn and go to chair; pt states she knew she would have that difficulty but decided to stand and turn anyway. Awareness of Deficits: decr    Extremity/Trunk Assessment Right Upper Extremity Assessment RUE ROM/Strength/Tone: WFL for tasks assessed Left Upper Extremity Assessment LUE ROM/Strength/Tone: Deficits LUE ROM/Strength/Tone Deficits: decr grip strength Right Lower Extremity Assessment RLE ROM/Strength/Tone: Deficits RLE ROM/Strength/Tone Deficits: grossly 4/5 RLE Sensation: WFL - Light Touch Left Lower Extremity Assessment LLE ROM/Strength/Tone: Deficits LLE ROM/Strength/Tone Deficits: grossly 4-/5; greater weakness apparent during functional tasks such as transfer and  when pt moves quickly during task she was use to doing PTA LLE Sensation: WFL - Light Touch Trunk Assessment Trunk Assessment: Normal   Balance Balance Balance Assessed: Yes Static Sitting Balance Static Sitting - Balance Support: No upper extremity supported;Feet supported Static Sitting - Level of Assistance: 6: Modified independent (Device/Increase time) Static Sitting - Comment/# of Minutes: no difficulties; EOB Dynamic Sitting Balance Dynamic Sitting - Balance Support: Left upper extremity supported;Feet supported;During functional activity Dynamic Sitting - Level of Assistance: 6: Modified independent (Device/Increase time) Dynamic Sitting Balance - Compensations: able to clean self after urinating.   Static Standing Balance Static Standing - Balance Support: No upper extremity supported Static Standing - Level of Assistance: 4: Min assist Static Standing - Comment/# of Minutes: balance testing , several lob req min A to maintain balance Single Leg Stance - Right Leg:  (<5s with hand held A) Single Leg Stance - Left Leg:  (<5s with handheld A) Tandem Stance - Right Leg:  (<5sec ) Tandem Stance - Left Leg:  (<2sec, left lean)  End of Session PT - End of Session Equipment Utilized During Treatment: Gait belt Activity Tolerance: Patient tolerated treatment well Patient left: in chair;with call bell/phone within reach;with family/visitor present Nurse Communication: Mobility status       Sharion Balloon 09/26/2012, 3:07 PM Sharion Balloon, SPT Acute Rehab Services [phone]

## 2012-09-26 NOTE — Progress Notes (Signed)
Utilization review completed.  

## 2012-09-26 NOTE — Consult Note (Signed)
Reason for Consult: Stroke Referring Physician: Sunnie Nielsen  CC: Left sided weakness  History is obtained from:Patient, daughters  HPI: [name] is a 78 y.o. female with a history of several falls last week. She was seen in the ED where it was noticed that she had a left facial droop, and an MRI was suggested, and the patient elected to pursue this as an outpatient. She denies left sided weakness.   LSN: Last Wednesday tpa given: no out of window NIHSS: 2  ROS: A 14 point ROS was performed and is negative except as noted in the HPI.  Past Medical History  Diagnosis Date  . Hypertension     Family History: No history of stroke   Social History: Tob: denies  Exam: Current vital signs: BP 137/62  Pulse 63  Temp 98 F (36.7 C) (Oral)  Resp 17  Ht 5\' 2"  (1.575 m)  Wt 68.55 kg (151 lb 2 oz)  BMI 27.64 kg/m2  SpO2 100% Vital signs in last 24 hours: Temp:  [98 F (36.7 C)] 98 F (36.7 C) (12/17 1806) Pulse Rate:  [63-66] 63  (12/18 0000) Resp:  [16-19] 17  (12/18 0000) BP: (137-141)/(59-65) 137/62 mmHg (12/18 0000) SpO2:  [96 %-100 %] 100 % (12/18 0000) Weight:  [68.55 kg (151 lb 2 oz)] 68.55 kg (151 lb 2 oz) (12/17 1806)  General: in bed, nad CV: RRR Mental Status: Patient is awake, alert, oriented  Immediate and remote memory are intact. Patient is able to give a clear and coherent history. Cranial Nerves: II: Visual Fields are full. Pupils are equal, round, and reactive to light.  Discs are difficult to visualize. III,IV, VI: EOMI without ptosis or diploplia.  V: Facial sensation is symmetric to temperature VII: Facial movement is notable for a left facial droop.  VIII: hearing is intact to voice X: Uvula elevates symmetrically XI: Shoulder shrug is symmetric. XII: tongue is midline without atrophy or fasciculations.  Motor: Tone is normal. Bulk is normal. 5/5 strength was present in her right arm and both legs. She has 4/5 strength in her left arm.   Sensory: Sensation is symmetric to light touch and pin in the arms and legs Deep Tendon Reflexes: 2+ and symmetric in the biceps and patellae.  Cerebellar: FNF consistent with weakness on the left. HKS intact bilaterally Gait: Did not assess due to patient safety concerns   I have reviewed labs in epic and the results pertinent to this consultation are: BMP elevated K, cr No leukocytosis  I have reviewed the images obtained:MRI brain - moderate sized right BG infarct.   Impression: 78 yo F with acute-subacute stroke, likely occurred last week prompting falls.   Recommendations: 1. HgbA1c, fasting lipid panel 2. PT consult, OT consult, Speech consult 3. Echocardiogram 4. Carotid dopplers 5. Prophylactic therapy-Antiplatelet med: Aspirin - dose 325mg   6. Risk factor modification 7. Telemetry monitoring 8. Frequent neuro checks   Ritta Slot, MD [facility] [phone]  If 7pm- 7am, please page neurology on call at [phone].

## 2012-09-26 NOTE — Progress Notes (Signed)
Patient seen and examined. Agree with note by [name], NP. Continue stroke work up.  Peggye Pitt, MD [facility] Pager: [phone]

## 2012-09-26 NOTE — Progress Notes (Signed)
PT Cancellation Note  Patient Details Name: [name] MRN: [id number] DOB: July 22, 1923   Cancelled Treatment:    Reason Eval/Treat Not Completed: Will see pt on 09/27/12, per PT eval and tx order is set to start at that time.  Thank you.    Sharion Balloon 09/26/2012, 8:42 AM Sharion Balloon, SPT Acute Rehab Services [phone]

## 2012-09-26 NOTE — H&P (Signed)
PCP:   [name], MD   Chief Complaint:  [name]  HPI: 78 yo female was seen last week in ED with uti and left facial droop.  Started on abx and outpt mri was arranged for possible cva.  Was placed on asa 324mg  daily.  She had fallen several times last week also and still has some bruising over the left side of her face.  Over the last week her left facial droop has improved.  She was having some slurred speech but this has resolved.  She was also having some left sided weakness but this has also resolved since last week.  Mri today done outpt did show acute cva so pt was sent here from pcp for further evaluation.  She deneis any fevers/n/v/d.  No cp no sob.  No headache no blurred vision.    Review of Systems:  O/w neg  Past Medical History: Past Medical History  Diagnosis Date  . Hypertension    History reviewed. No pertinent past surgical history.  Medications: Prior to Admission medications   Medication Sig Start Date End Date Taking? Authorizing Provider  alendronate (FOSAMAX) 70 MG tablet Take 70 mg by mouth every 7 (seven) days. Take with a full glass of water on an empty stomach. On Mondays   Yes Historical Provider, MD  aspirin 81 MG tablet Take 81 mg by mouth daily.   Yes Historical Provider, MD  Calcium Carbonate-Vitamin D (CALTRATE 600+D PO) Take 1 tablet by mouth 2 (two) times daily.   Yes Historical Provider, MD  furosemide (LASIX) 40 MG tablet Take 40 mg by mouth daily.   Yes Historical Provider, MD  losartan (COZAAR) 100 MG tablet Take 100 mg by mouth daily.   Yes Historical Provider, MD  metoprolol (LOPRESSOR) 50 MG tablet Take 25 mg by mouth 2 (two) times daily.   Yes Historical Provider, MD  Polyethyl Glycol-Propyl Glycol (SYSTANE OP) Apply 1 drop to eye 2 (two) times daily.   Yes Historical Provider, MD  simvastatin (ZOCOR) 20 MG tablet Take 20 mg by mouth every evening.   Yes Historical Provider, MD  sulfamethoxazole-trimethoprim (BACTRIM DS,SEPTRA DS)  800-160 MG per tablet Take 1 tablet by mouth every 12 (twelve) hours. Start date 09/21/12; Duration unknown 09/21/12  Yes Gerhard Munch, MD  triamcinolone cream (KENALOG) 0.1 % Apply 1 application topically daily as needed. For rash   Yes Historical Provider, MD    Allergies:  No Known Allergies  Social History:  reports that she has never smoked. She does not have any smokeless tobacco history on file. She reports that she does not drink alcohol or use illicit drugs.  Family History: History reviewed. No pertinent family history.  Physical Exam: Filed Vitals:   09/25/12 2355 09/26/12 0000 09/26/12 0130 09/26/12 0154  BP: 141/59 137/62 129/58   Pulse: 63 63 67   Temp:    98 F (36.7 C)  TempSrc:      Resp: 19 17 18    Height:      Weight:      SpO2: 96% 100% 99%    General appearance: alert, cooperative and no distress ecchymosis yellowish now left side of face eomi. Neck: no JVD and supple, symmetrical, trachea midline Lungs: clear to auscultation bilaterally Heart: regular rate and rhythm, S1, S2 normal, no murmur, click, rub or gallop Abdomen: soft, non-tender; bowel sounds normal; no masses,  no organomegaly Extremities: extremities normal, atraumatic, no cyanosis or edema Pulses: 2+ and symmetric Skin: Skin color, texture, turgor  normal. No rashes or lesions Neurologic: Grossly normal    Labs on Admission:   [facility] 09/26/12 0015 09/25/12 2357  NA 138 138  K 5.2* 5.3*  CL 110 106  CO2 -- 21  GLUCOSE 111* 112*  BUN 31* 29*  CREATININE 1.50* 1.54*  CALCIUM -- 9.1  MG -- --  PHOS -- --    Basename 09/25/12 2357  AST 20  ALT 12  ALKPHOS 81  BILITOT 0.2*  PROT 6.6  ALBUMIN 3.4*    Basename 09/26/12 0015 09/25/12 2357  WBC -- 10.0  NEUTROABS -- --  HGB 12.6 11.9*  HCT 37.0 36.9  MCV -- 90.9  PLT -- 284    Radiological Exams on Admission: Dg Chest 2 View  09/21/2012  *RADIOLOGY REPORT*  Clinical Data: Fall.  Chest pain.  Hypertension.  CHEST -  2 VIEW  Comparison: 12/18/2008  Findings: Heart size is within normal limits.  Both lungs are clear.  No evidence of pneumothorax or pleural effusion.  No mass or lymphadenopathy identified.  IMPRESSION: No active disease.   Original Report Authenticated By: Myles Rosenthal, M.D.    Ct Head Wo Contrast  09/21/2012  *RADIOLOGY REPORT*  Clinical Data:  Injury after fall.  CT HEAD WITHOUT CONTRAST CT MAXILLOFACIAL WITHOUT CONTRAST  Technique:  Multidetector CT imaging of the head and maxillofacial structures were performed using the standard protocol without intravenous contrast. Multiplanar CT image reconstructions of the maxillofacial structures were also generated.  Comparison:   None.  CT HEAD  Findings: Bony calvarium appears intact.  Mild diffuse cortical atrophy is noted. Low density is seen involving right periventricular white matter most consistent with old infarction. No mass effect or midline shift is noted.  Ventricular size is within normal limits. There is no evidence of mass lesion, hemorrhage or acute infarction.  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  No acute intracranial abnormality seen.  CT MAXILLOFACIAL  Findings:   No fracture or other bony abnormality is noted. Paranasal sinuses appear normal.  Globes and orbits appear normal.  IMPRESSION: No abnormalities seen in the maxillofacial region.   Original Report Authenticated By: Lupita Raider.,  M.D.    Mr Brain Wo Contrast  09/25/2012  *RADIOLOGY REPORT*  Clinical Data: Left facial droop.  Slurred speech.  Fall last week.  MRI HEAD WITHOUT CONTRAST  Technique:  Multiplanar, multiecho pulse sequences of the brain and surrounding structures were obtained according to standard protocol without intravenous contrast.  Comparison: 09/21/2012 CT.  No comparison MR.  Findings:  Moderate size acute/subacute infarct extends from the right lenticular nucleus to the right caudate.  No intracranial hemorrhage.   Mild to moderate small vessel disease type changes.  No hydrocephalus.  No intracranial mass lesion detected on this unenhanced exam.  Mild spinal stenosis with slight cord flattening C2-3 level incompletely assessed on the present exam.  IMPRESSION:  Moderate size acute/subacute infarct extends from the right lenticular nucleus to the right caudate.  No intracranial hemorrhage.  Mild to moderate small vessel disease type changes.  Mild spinal stenosis with slight cord flattening C2-3 level incompletely assessed on the present exam.  The results are presently being called to the referring clinician by [facility] MR technologist with the patient being held for further direction.   Original Report Authenticated By: Lacy Duverney, M.D.    Ct Maxillofacial Wo Cm  09/21/2012  *RADIOLOGY REPORT*  Clinical Data:  Injury after fall.  CT HEAD WITHOUT CONTRAST CT MAXILLOFACIAL WITHOUT CONTRAST  Technique:  Multidetector CT imaging of the head and maxillofacial structures were performed using the standard protocol without intravenous contrast. Multiplanar CT image reconstructions of the maxillofacial structures were also generated.  Comparison:   None.  CT HEAD  Findings: Bony calvarium appears intact.  Mild diffuse cortical atrophy is noted. Low density is seen involving right periventricular white matter most consistent with old infarction. No mass effect or midline shift is noted.  Ventricular size is within normal limits. There is no evidence of mass lesion, hemorrhage or acute infarction.  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  No acute intracranial abnormality seen.  CT MAXILLOFACIAL  Findings:   No fracture or other bony abnormality is noted. Paranasal sinuses appear normal.  Globes and orbits appear normal.  IMPRESSION: No abnormalities seen in the maxillofacial region.   Original Report Authenticated By: Lupita Raider.,  M.D.     Assessment/Plan  78 yo female with  subacute cva left facial droop Principal Problem:  *CVA (cerebral infarction) Active Problems:  Hypertension  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  Fall at home  UTI (lower urinary tract infection)  Admit for completion of neuro w/u.  cva pathway.  Neurology consulted.  Cont bactrim for uti.    Kielee Care A 09/26/2012, 2:10 AM

## 2012-09-26 NOTE — Progress Notes (Signed)
VASCULAR LAB PRELIMINARY  PRELIMINARY  PRELIMINARY  PRELIMINARY  Carotid duplex  completed.    Preliminary report:  Bilateral:  No evidence of hemodynamically significant internal carotid artery stenosis.   Vertebral artery flow is antegrade.      Raylen Ken, RVT 09/26/2012, 11:49 AM

## 2012-09-26 NOTE — Evaluation (Signed)
Speech Language Pathology Evaluation Patient Details Name: [name] MRN: [id number] DOB: March 27, 1923 Today's Date: 09/26/2012 Time: 1000-1025 SLP Time Calculation (min): 25 min  Problem List:  Patient Active Problem List  Diagnosis  . CVA (cerebral infarction)  . Hypertension  . 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  . Fall at home  . UTI (lower urinary tract infection)  . Hyperkalemia  . Acute renal insufficiency   Past Medical History:  Past Medical History  Diagnosis Date  . Hypertension   . Arthritis   . Gout, joint    Past Surgical History:  Past Surgical History  Procedure Date  . Joint replacement left knee  . Eye surgery both   HPI:  78 yo female was seen last week in ED with uti and left facial droop.  Started on abx and outpt mri was arranged for possible cva.  Was placed on asa 324mg  daily.  She had fallen several times last week also and still has some bruising over the left side of her face.  Over the last week her left facial droop has improved.  She was having some slurred speech but this has resolved.  She was also having some left sided weakness but this has also resolved since last week.  Mri today done outpt did show acute cva so pt was sent here from pcp for further evaluation.  She deneis any fevers/n/v/d.  No cp no sob.  No headache no blurred vision.     Assessment / Plan / Recommendation Clinical Impression  Pt presents with signs of decreased airway protection specifically with large straw sips. Pt consumed over 4oz of water via cup without evidence of aspiration though a straw sip resulted in cough x2. Pt consumed solids with trace residual in left buccal cavity, removed with cues for a lingual sweep. Recommend pt initate a regular consistency diet with thin liquids, no straws and basic aspriation precautions. Given evidence of sensation of penetration/aspiration and no signs of aspiration pna despite subacute inract, suspect pt will have minimal aspiration pna  risk if precautions are followed. SLP will follow for tolerance.     SLP Assessment       Follow Up Recommendations       Frequency and Duration min 2x/week      Pertinent Vitals/Pain NA   SLP Goals     SLP Evaluation Prior Functioning      Cognition       Comprehension       Expression     Oral / Motor Oral Motor/Sensory Function Overall Oral Motor/Sensory Function: Impaired Labial ROM: Reduced left Labial Symmetry: Abnormal symmetry left Labial Strength: Reduced Labial Sensation: Reduced Lingual ROM: Reduced left Lingual Symmetry: Within Functional Limits Lingual Strength: Reduced Lingual Sensation: Reduced Facial ROM: Reduced left Facial Symmetry: Within Functional Limits   GO    Harlon Ditty, MA CCC-SLP [phone]  [name] 09/26/2012, 10:36 AM

## 2012-09-26 NOTE — Progress Notes (Signed)
[facility] PROGRESS NOTE  [name] NWG:[id number] DOB: 1923/04/30 DOA: 09/25/2012 PCP: Paula Diego, MD  Assessment/Plan: CVA (cerebral infarction) per OP MRI. Appreciate neuro input. ST/PT/OT pending. Lipids WNL. Echo, carotid doppler pending. Continue asa, statin, BB, ARB. SBP range 120-140. Active Problems:  Acute renal insufficiency: likely related to decreased po intake. Will gently hydrate with IV fluids until passes swallow eval. Recheck in am Hypertension: SBP range 120-140. Holding home lasix for now. Continue ARB, BB. Monitor and manage allowing for permissive HTN.  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: resolved  Fall at home : likely related to #1. Will have PT/OT evaluated/treat. Continue fall precautions UTI (lower urinary tract infection: culture pending. Afebrile. WC WNL. Non-toxic appearing. Rocephin day #1 Hyperkalemia: mild. Likely related to #2 and decreased po intake. Will gently hydrate. Recheck in am.   Code Status: full Family Communication:  Disposition Plan: home when stable. Does live alone with family nearby.    Consultants:  neuro  Procedures:  none  Antibiotics:  Rocephin 09/26/12 >>>  HPI/Subjective: Awake alert. Denies pain/discomfort.   Objective: Filed Vitals:   09/26/12 0130 09/26/12 0154 09/26/12 0232 09/26/12 0441  BP: 129/58  133/67 121/63  Pulse: 67  69 66  Temp:  98 F (36.7 C) 97.7 F (36.5 C) 98.2 F (36.8 C)  TempSrc:   Oral Oral  Resp: 18  17 18   Height:   5\' 2"  (1.575 m)   Weight:   68.675 kg (151 lb 6.4 oz)   SpO2: 99%  100% 98%   No intake or output data in the 24 hours ending 09/26/12 0723 Filed Weights   09/25/12 1806 09/26/12 0232  Weight: 68.55 kg (151 lb 2 oz) 68.675 kg (151 lb 6.4 oz)    Exam:   General:  Awake alert well nourished. Bruising to left eye/cheek. NAD  Cardiovascular: RRR +murmur No LEE PPP  Respiratory: normal effort BSCTAB No wheeze/rhonchi  Abdomen: round, soft +BS non-tender to  palpation  Neuro: facial symmetry, speech clear cranial nerve II-XII intact  Data Reviewed: Basic Metabolic Panel:  Lab 09/26/12 5784 09/25/12 2357 09/21/12 1334  NA 138 138 139  K 5.2* 5.3* 4.1  CL 110 106 102  CO2 -- 21 25  GLUCOSE 111* 112* 82  BUN 31* 29* 40*  CREATININE 1.50* 1.54* 1.18*  CALCIUM -- 9.1 9.9  MG -- -- --  PHOS -- -- --   Liver Function Tests:  Lab 09/25/12 2357 09/21/12 1334  AST 20 24  ALT 12 13  ALKPHOS 81 101  BILITOT 0.2* 0.3  PROT 6.6 7.3  ALBUMIN 3.4* 3.7   No results found for this basename: LIPASE:5,AMYLASE:5 in the last 168 hours No results found for this basename: AMMONIA:5 in the last 168 hours CBC:  Lab 09/26/12 0015 09/25/12 2357 09/21/12 1334  WBC -- 10.0 12.0*  NEUTROABS -- -- 8.7*  HGB 12.6 11.9* 13.9  HCT 37.0 36.9 42.0  MCV -- 90.9 89.2  PLT -- 284 329   Cardiac Enzymes: No results found for this basename: CKTOTAL:5,CKMB:5,CKMBINDEX:5,TROPONINI:5 in the last 168 hours BNP (last 3 results) No results found for this basename: PROBNP:3 in the last 8760 hours CBG: No results found for this basename: GLUCAP:5 in the last 168 hours  Recent Results (from the past 240 hour(s))  URINE CULTURE     Status: Normal   Collection Time   09/21/12  1:55 PM      Component Value Range Status Comment   Specimen Description URINE, CLEAN  CATCH   Final    Special Requests NONE   Final    Culture  Setup Time 09/21/2012 16:13   Final    Colony Count 95,000 COLONIES/ML   Final    Culture ESCHERICHIA COLI   Final    Report Status 09/24/2012 FINAL   Final    Organism ID, Bacteria ESCHERICHIA COLI   Final      Studies: Mr Brain Wo Contrast  09/25/2012  *RADIOLOGY REPORT*  Clinical Data: Left facial droop.  Slurred speech.  Fall last week.  MRI HEAD WITHOUT CONTRAST  Technique:  Multiplanar, multiecho pulse sequences of the brain and surrounding structures were obtained according to standard protocol without intravenous contrast.  Comparison:  09/21/2012 CT.  No comparison MR.  Findings:  Moderate size acute/subacute infarct extends from the right lenticular nucleus to the right caudate.  No intracranial hemorrhage.  Mild to moderate small vessel disease type changes.  No hydrocephalus.  No intracranial mass lesion detected on this unenhanced exam.  Mild spinal stenosis with slight cord flattening C2-3 level incompletely assessed on the present exam.  IMPRESSION:  Moderate size acute/subacute infarct extends from the right lenticular nucleus to the right caudate.  No intracranial hemorrhage.  Mild to moderate small vessel disease type changes.  Mild spinal stenosis with slight cord flattening C2-3 level incompletely assessed on the present exam.  The results are presently being called to the referring clinician by [facility] MR technologist with the patient being held for further direction.   Original Report Authenticated By: [name], M.D.     Scheduled Meds:    . aspirin  300 mg Rectal Daily   Or  . aspirin  325 mg Oral Daily  . cefTRIAXone (ROCEPHIN)  IV  1 g Intravenous QHS  . losartan  100 mg Oral Daily  . metoprolol  2.5 mg Intravenous Q6H  . metoprolol  25 mg Oral BID  . simvastatin  20 mg Oral QPM  . sodium chloride  3 mL Intravenous Q12H   Continuous Infusions:   Principal Problem:  *CVA (cerebral infarction) Active Problems:  Hypertension  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  Fall at home  UTI (lower urinary tract infection)  Hyperkalemia  Acute renal insufficiency    Time spent: 35 minutes    [facility]  [facility]  If 8PM-8AM, please contact night-coverage at [email or web address], password [facility] 09/26/2012, 7:23 AM  LOS: 1 day

## 2012-09-26 NOTE — ED Provider Notes (Signed)
History     CSN: [id number]  Arrival date & time 09/25/12  1738   First MD Initiated Contact with Patient 09/25/12 2356      Chief Complaint  Patient presents with  . Cerebrovascular Accident    (Consider location/radiation/quality/duration/timing/severity/associated sxs/prior treatment) HPI History provided by patient and family. Fell 3 days ago, evaluated in the emergency department, having left facial droop at that time. Diagnosed with UTI and possible stroke discharged home for outpatient MRI. Patient had outpatient MRI today was told that she has had a stroke and to come back to the emergency department. She was having some left-sided weakness initially but that has resolved. She has a persistent left facial droop. No further falls. Left knee gout last week and having trouble ambulating since that time. No new deficits otherwise. Symptoms moderate in severity. She's increased her baby aspirin 325 mg daily and took that prior to arrival. Past Medical History  Diagnosis Date  . Hypertension     History reviewed. No pertinent past surgical history.  History reviewed. No pertinent family history.  History  Substance Use Topics  . Smoking status: Never Smoker   . Smokeless tobacco: Not on file  . Alcohol Use: No    OB History    Grav Para Term Preterm Abortions TAB SAB Ect Mult Living                  Review of Systems  Constitutional: Negative for fever and chills.  HENT: Negative for neck pain and neck stiffness.   Eyes: Negative for pain.  Respiratory: Negative for shortness of breath.   Cardiovascular: Negative for chest pain and palpitations.  Gastrointestinal: Negative for abdominal pain.  Genitourinary: Negative for dysuria.  Musculoskeletal: Negative for back pain.  Skin: Negative for rash.  Neurological: Positive for weakness. Negative for headaches.  All other systems reviewed and are negative.    Allergies  Review of patient's allergies indicates no  known allergies.  Home Medications   Current Outpatient Rx  Name  Route  Sig  Dispense  Refill  . ALENDRONATE SODIUM 70 MG PO TABS   Oral   Take 70 mg by mouth every 7 (seven) days. Take with a full glass of water on an empty stomach. On Mondays         . ASPIRIN 81 MG PO TABS   Oral   Take 81 mg by mouth daily.         Marland Kitchen CALTRATE 600+D PO   Oral   Take 1 tablet by mouth 2 (two) times daily.         . FUROSEMIDE 40 MG PO TABS   Oral   Take 40 mg by mouth daily.         Marland Kitchen LOSARTAN POTASSIUM 100 MG PO TABS   Oral   Take 100 mg by mouth daily.         Marland Kitchen METOPROLOL TARTRATE 50 MG PO TABS   Oral   Take 25 mg by mouth 2 (two) times daily.         Frazier Butt OP   Ophthalmic   Apply 1 drop to eye 2 (two) times daily.         Marland Kitchen SIMVASTATIN 20 MG PO TABS   Oral   Take 20 mg by mouth every evening.         . SULFAMETHOXAZOLE-TRIMETHOPRIM 800-160 MG PO TABS   Oral   Take 1 tablet by mouth every 12 (twelve) hours.  Start date 09/21/12; Duration unknown         . TRIAMCINOLONE ACETONIDE 0.1 % EX CREA   Topical   Apply 1 application topically daily as needed. For rash           BP 137/62  Pulse 63  Temp 98 F (36.7 C) (Oral)  Resp 17  Ht 5\' 2"  (1.575 m)  Wt 151 lb 2 oz (68.55 kg)  BMI 27.64 kg/m2  SpO2 100%  Physical Exam  Constitutional: She is oriented to person, place, and time. She appears well-developed and well-nourished.  HENT:  Head: Normocephalic and atraumatic.  Eyes: Conjunctivae normal and EOM are normal. Pupils are equal, round, and reactive to light.  Neck: Full passive range of motion without pain. Neck supple. No thyromegaly present.       No meningismus  Cardiovascular: Normal rate, regular rhythm, S1 normal, S2 normal and intact distal pulses.   Pulmonary/Chest: Effort normal and breath sounds normal. No respiratory distress.  Abdominal: Soft. Bowel sounds are normal. She exhibits no distension. There is no tenderness. There is  no CVA tenderness.  Musculoskeletal: Normal range of motion.  Neurological: She is alert and oriented to person, place, and time. She has normal strength and normal reflexes. No sensory deficit. She displays a negative Romberg sign. GCS eye subscore is 4. GCS verbal subscore is 5. GCS motor subscore is 6.       Left facial droop. Equal grips, biceps triceps strengths. Sensorium to light touch equal and intact throughout. No lower extremity deficits with equal dorsi plantar flexion.  Skin: Skin is warm and dry. No rash noted. No cyanosis. Nails show no clubbing.  Psychiatric: She has a normal mood and affect. Her speech is normal and behavior is normal.    ED Course  Procedures (including critical care time)  Results for orders placed during the hospital encounter of 09/25/12  CBC      Component Value Range   WBC 10.0  4.0 - 10.5 K/uL   RBC 4.06  3.87 - 5.11 MIL/uL   Hemoglobin 11.9 (*) 12.0 - 15.0 g/dL   HCT 16.1  09.6 - 04.5 %   MCV 90.9  78.0 - 100.0 fL   MCH 29.3  26.0 - 34.0 pg   MCHC 32.2  30.0 - 36.0 g/dL   RDW 40.9  81.1 - 91.4 %   Platelets 284  150 - 400 K/uL  COMPREHENSIVE METABOLIC PANEL      Component Value Range   Sodium 138  135 - 145 mEq/L   Potassium 5.3 (*) 3.5 - 5.1 mEq/L   Chloride 106  96 - 112 mEq/L   CO2 21  19 - 32 mEq/L   Glucose, Bld 112 (*) 70 - 99 mg/dL   BUN 29 (*) 6 - 23 mg/dL   Creatinine, Ser 7.82 (*) 0.50 - 1.10 mg/dL   Calcium 9.1  8.4 - 95.6 mg/dL   Total Protein 6.6  6.0 - 8.3 g/dL   Albumin 3.4 (*) 3.5 - 5.2 g/dL   AST 20  0 - 37 U/L   ALT 12  0 - 35 U/L   Alkaline Phosphatase 81  39 - 117 U/L   Total Bilirubin 0.2 (*) 0.3 - 1.2 mg/dL   GFR calc non Af Amer 29 (*) >90 mL/min   GFR calc Af Amer 34 (*) >90 mL/min  POCT I-STAT, CHEM 8      Component Value Range   Sodium 138  135 -  145 mEq/L   Potassium 5.2 (*) 3.5 - 5.1 mEq/L   Chloride 110  96 - 112 mEq/L   BUN 31 (*) 6 - 23 mg/dL   Creatinine, Ser 1.61 (*) 0.50 - 1.10 mg/dL   Glucose,  Bld 096 (*) 70 - 99 mg/dL   Calcium, Ion 0.45  4.09 - 1.30 mmol/L   TCO2 21  0 - 100 mmol/L   Hemoglobin 12.6  12.0 - 15.0 g/dL   HCT 81.1  91.4 - 78.2 %   Dg Chest 2 View  09/21/2012  *RADIOLOGY REPORT*  Clinical Data: Fall.  Chest pain.  Hypertension.  CHEST - 2 VIEW  Comparison: 12/18/2008  Findings: Heart size is within normal limits.  Both lungs are clear.  No evidence of pneumothorax or pleural effusion.  No mass or lymphadenopathy identified.  IMPRESSION: No active disease.   Original Report Authenticated By: Myles Rosenthal, M.D.    Ct Head Wo Contrast  09/21/2012  *RADIOLOGY REPORT*  Clinical Data:  Injury after fall.  CT HEAD WITHOUT CONTRAST CT MAXILLOFACIAL WITHOUT CONTRAST  Technique:  Multidetector CT imaging of the head and maxillofacial structures were performed using the standard protocol without intravenous contrast. Multiplanar CT image reconstructions of the maxillofacial structures were also generated.  Comparison:   None.  CT HEAD  Findings: Bony calvarium appears intact.  Mild diffuse cortical atrophy is noted. Low density is seen involving right periventricular white matter most consistent with old infarction. No mass effect or midline shift is noted.  Ventricular size is within normal limits. There is no evidence of mass lesion, hemorrhage or acute infarction.  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  No acute intracranial abnormality seen.  CT MAXILLOFACIAL  Findings:   No fracture or other bony abnormality is noted. Paranasal sinuses appear normal.  Globes and orbits appear normal.  IMPRESSION: No abnormalities seen in the maxillofacial region.   Original Report Authenticated By: Lupita Raider.,  M.D.    Mr Brain Wo Contrast  09/25/2012  *RADIOLOGY REPORT*  Clinical Data: Left facial droop.  Slurred speech.  Fall last week.  MRI HEAD WITHOUT CONTRAST  Technique:  Multiplanar, multiecho pulse sequences of the brain and surrounding  structures were obtained according to standard protocol without intravenous contrast.  Comparison: 09/21/2012 CT.  No comparison MR.  Findings:  Moderate size acute/subacute infarct extends from the right lenticular nucleus to the right caudate.  No intracranial hemorrhage.  Mild to moderate small vessel disease type changes.  No hydrocephalus.  No intracranial mass lesion detected on this unenhanced exam.  Mild spinal stenosis with slight cord flattening C2-3 level incompletely assessed on the present exam.  IMPRESSION:  Moderate size acute/subacute infarct extends from the right lenticular nucleus to the right caudate.  No intracranial hemorrhage.  Mild to moderate small vessel disease type changes.  Mild spinal stenosis with slight cord flattening C2-3 level incompletely assessed on the present exam.  The results are presently being called to the referring clinician by [facility] MR technologist with the patient being held for further direction.   Original Report Authenticated By: Lacy Duverney, M.D.    Dg Knee Complete 4 Views Left  09/21/2012  *RADIOLOGY REPORT*  Clinical Data: Fall.  Knee injury and pain.  LEFT KNEE - COMPLETE 4+ VIEW  Comparison: 05/15/2008  Findings: Total knee arthroplasty is seen with components in expected position.  No evidence of hardware failure or loosening. No evidence of acute fracture or dislocation.  No evidence of  knee joint effusion.  IMPRESSION: No acute findings.   Original Report Authenticated By: Myles Rosenthal, M.D.    Ct Maxillofacial Wo Cm  09/21/2012  *RADIOLOGY REPORT*  Clinical Data:  Injury after fall.  CT HEAD WITHOUT CONTRAST CT MAXILLOFACIAL WITHOUT CONTRAST  Technique:  Multidetector CT imaging of the head and maxillofacial structures were performed using the standard protocol without intravenous contrast. Multiplanar CT image reconstructions of the maxillofacial structures were also generated.  Comparison:   None.  CT HEAD  Findings: Bony calvarium appears intact.   Mild diffuse cortical atrophy is noted. Low density is seen involving right periventricular white matter most consistent with old infarction. No mass effect or midline shift is noted.  Ventricular size is within normal limits. There is no evidence of mass lesion, hemorrhage or acute infarction.  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  No acute intracranial abnormality seen.  CT MAXILLOFACIAL  Findings:   No fracture or other bony abnormality is noted. Paranasal sinuses appear normal.  Globes and orbits appear normal.  IMPRESSION: No abnormalities seen in the maxillofacial region.   Original Report Authenticated By: Lupita Raider.,  M.D.       1. Stroke      Date: 09/26/2012  Rate: 63  Rhythm: normal sinus rhythm  QRS Axis: normal  Intervals: normal  ST/T Wave abnormalities: nonspecific ST changes  Conduction Disutrbances:none  Narrative Interpretation:   Old EKG Reviewed: unchanged  12:22 AM d/w Dr Onalee Hua, will admit  12:27 AM d/w NEU, Dr. Petra Kuba evaluated bedside - will follow MDM   Subacute stroke with outpatient MRI as above. Labs, EKG reviewed. Vital signs and old records reviewed. Medicine consult for admission. Neurology consult.        Sunnie Nielsen, MD 09/26/12 [phone]

## 2012-09-26 NOTE — Progress Notes (Signed)
Stroke Team Progress Note  HISTORY [name] is a 78 y.o. female with a history of several falls last week. She was seen in the ED where it was noticed that she had a left facial droop, and an MRI was suggested, and the patient elected to pursue this as an outpatient. She denies left sided weakness. Patient was not a TPA candidate secondary to delay in arrival. She was admitted for further evaluation and treatment.  SUBJECTIVE No family is at the bedside.  She states she fell last Wed. She has a black left eye. Overall she feels her condition is stable.   OBJECTIVE Most recent Vital Signs: Filed Vitals:   09/26/12 0232 09/26/12 0441 09/26/12 0800 09/26/12 1000  BP: 133/67 121/63 123/53 124/53  Pulse: 69 66 62 64  Temp: 97.7 F (36.5 C) 98.2 F (36.8 C) 98 F (36.7 C) 98.2 F (36.8 C)  TempSrc: Oral Oral Oral Oral  Resp: 17 18 18 18   Height: 5\' 2"  (1.575 m)     Weight: 68.675 kg (151 lb 6.4 oz)     SpO2: 100% 98% 97% 98%   CBG (last 3)  No results found for this basename: GLUCAP:3 in the last 72 hours  IV Fluid Intake:     MEDICATIONS    . aspirin  300 mg Rectal Daily   Or  . aspirin  325 mg Oral Daily  . cefTRIAXone (ROCEPHIN)  IV  1 g Intravenous QHS  . losartan  100 mg Oral Daily  . metoprolol  2.5 mg Intravenous Q6H  . metoprolol  25 mg Oral BID  . simvastatin  20 mg Oral QPM  . sodium chloride  3 mL Intravenous Q12H   PRN:  sodium chloride, sodium chloride  Diet:  General thin liquids Activity:  OOB with assistance DVT Prophylaxis:  SCDs ordered  CLINICALLY SIGNIFICANT STUDIES Basic Metabolic Panel:  Lab 09/26/12 1610 09/25/12 2357 09/21/12 1334  NA 138 138 --  K 5.2* 5.3* --  CL 110 106 --  CO2 -- 21 25  GLUCOSE 111* 112* --  BUN 31* 29* --  CREATININE 1.50* 1.54* --  CALCIUM -- 9.1 9.9  MG -- -- --  PHOS -- -- --   Liver Function Tests:  Lab 09/25/12 2357 09/21/12 1334  AST 20 24  ALT 12 13  ALKPHOS 81 101  BILITOT 0.2* 0.3  PROT 6.6 7.3   ALBUMIN 3.4* 3.7   CBC:  Lab 09/26/12 0015 09/25/12 2357 09/21/12 1334  WBC -- 10.0 12.0*  NEUTROABS -- -- 8.7*  HGB 12.6 11.9* --  HCT 37.0 36.9 --  MCV -- 90.9 89.2  PLT -- 284 329   Coagulation: No results found for this basename: LABPROT:4,INR:4 in the last 168 hours Cardiac Enzymes: No results found for this basename: CKTOTAL:3,CKMB:3,CKMBINDEX:3,TROPONINI:3 in the last 168 hours Urinalysis:  Lab 09/21/12 1355  COLORURINE YELLOW  LABSPEC 1.021  PHURINE 5.0  GLUCOSEU NEGATIVE  HGBUR NEGATIVE  BILIRUBINUR NEGATIVE  KETONESUR NEGATIVE  PROTEINUR NEGATIVE  UROBILINOGEN 0.2  NITRITE POSITIVE*  LEUKOCYTESUR SMALL*   Lipid Panel    Component Value Date/Time   CHOL 169 09/26/2012 0445   TRIG 131 09/26/2012 0445   HDL 52 09/26/2012 0445   CHOLHDL 3.3 09/26/2012 0445   VLDL 26 09/26/2012 0445   LDLCALC 91 09/26/2012 0445   HgbA1C  No results found for this basename: HGBA1C   Urine Drug Screen:   No results found for this basename: labopia, cocainscrnur, labbenz, amphetmu,  thcu, labbarb    Alcohol Level: No results found for this basename: ETH:2 in the last 168 hours  CT of the brain    MRI of the brain   09/25/2012  Moderate size acute/subacute infarct extends from the right lenticular nucleus to the right caudate.  No intracranial hemorrhage.  Mild to moderate small vessel disease type changes.  Mild spinal stenosis with slight cord flattening C2-3 level incompletely assessed on the present exam.  MRA of the brain  See TCD  2D Echocardiogram  EF 55-60% with no source of embolus.   Carotid Doppler  No evidence of hemodynamically significant internal carotid artery stenosis. Vertebral artery flow is antegrade.   CXR    EKG  normal sinus rhythm.   Therapy Recommendations home health PT  Physical Exam   Frail elderly lady not in distress.Awake alert. Afebrile. Head is nontraumatic. Neck is supple without bruit. Hearing is normal. Cardiac exam no murmur or  gallop. Lungs are clear to auscultation. Distal pulses are well felt.  Neurological Exam : Awake alert oriented x 3 normal speech and language. Mild left lower face asymmetry. Tongue midline. No drift. Mild diminished fine finger movements on left. Orbits right over left upper extremity. Mild left grip weak.. Normal sensation . Normal coordination.  ASSESSMENT [name] is a 78 y.o. female presenting with left facial weakness. 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 Infarct felt to be thrombotic secondary to small vessel disease.  Work up underway. On aspirin 81 mg orally every day prior to admission. Now on aspirin 325 mg orally every day for secondary stroke prevention. Patient with resultant left sided weakness.  Hypertension Hyperlipidemia, LDL 91, on statin PTA, at goal LDL < 100  Hospital day # 1  TREATMENT/PLAN  Continue aspirin 325 mg orally every day for secondary stroke prevention.  TCD to evaluate intracranial vasculature  Home health PT  Annie Main, MSN, RN, ANVP-BC, ANP-BC, [facility] Pager: [phone] 09/26/2012 11:01 AM  I have personally obtained a history, examined the patient, evaluated imaging results, and formulated the assessment and plan of care. I agree with the above.   Delia Heady, MD Medical Director [facility] Pager: [phone] 09/27/2012 6:19 PM

## 2012-09-27 DIAGNOSIS — N289 Disorder of kidney and ureter, unspecified: Secondary | ICD-10-CM

## 2012-09-27 DIAGNOSIS — E875 Hyperkalemia: Secondary | ICD-10-CM

## 2012-09-27 LAB — URINE CULTURE: Culture: NO GROWTH

## 2012-09-27 LAB — BASIC METABOLIC PANEL
Chloride: 105 mEq/L (ref 96–112)
GFR calc Af Amer: 37 mL/min — ABNORMAL LOW (ref >90)
Potassium: 5.8 mEq/L — ABNORMAL HIGH (ref 3.5–5.1)
Sodium: 134 mEq/L — ABNORMAL LOW (ref 135–145)

## 2012-09-27 LAB — LIPID PANEL
HDL: 49 mg/dL (ref >39)
LDL Cholesterol: 82 mg/dL (ref 0–99)
Total CHOL/HDL Ratio: 3.3 RATIO
Triglycerides: 155 mg/dL — ABNORMAL HIGH (ref <150)

## 2012-09-27 MED ORDER — STROKE: EARLY STAGES OF RECOVERY BOOK
Freq: Once | Status: AC
  Administered 2012-09-27: 17:00:00
  Filled 2012-09-27: qty 1

## 2012-09-27 MED ORDER — FUROSEMIDE 20 MG PO TABS
20.0000 mg | ORAL_TABLET | Freq: Every day | ORAL | Status: DC
  Administered 2012-09-27 – 2012-09-28 (×2): 20 mg via ORAL
  Filled 2012-09-27 (×2): qty 1

## 2012-09-27 MED ORDER — SODIUM POLYSTYRENE SULFONATE 15 GM/60ML PO SUSP
15.0000 g | Freq: Once | ORAL | Status: AC
  Administered 2012-09-27: 15 g via ORAL
  Filled 2012-09-27: qty 60

## 2012-09-27 NOTE — Progress Notes (Signed)
[facility] PROGRESS NOTE  [name] AVW:[id number] DOB: 1923/06/05 DOA: 09/25/2012 PCP: [name], MD  Assessment/Plan: CVA (cerebral infarction) per OP MRI. Appreciate neuro input. ST/PT/OT complete. Recommending HH Lipids WNL. Echo yields 70% EF with mild LVD. Carotid doppler yields bilateral no evidence fo significant internal carotid artery stenosis. Continue asa, statin, BB, ARB. SBP range 114-136.  Active Problems:  Acute renal insufficiency: likely related to decreased po intake with some chronic component. Creatinine trending downward. Potassium trending upward. Hold nephrotoxins. Will recheck in am  Hyperkalemia: potassium slowly trending upward and is 5.8 today. Etiology unclear. No supplements. Will give 15g kayexelate, resume low dose lasix, check EKG. Recheck in am.   Hypertension: SBP range 114-136. Resume home lasix at half dose . Continue ARB, BB. Monitor and manage allowing for permissive HTN.   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: resolved   Fall at home : likely related to #1. Will have PT/OT evaluated/treat. Continue fall precautions   UTI (lower urinary tract infection: culture pending. Afebrile. WC WNL. Non-toxic appearing. Rocephin day #2   Code Status: full Family Communication: pt at bedside Disposition Plan: home with [facility] PT. Likely tomorrow am   Consultants:  neuro  Procedures:  none  Antibiotics:  Rocephin 09/26/12>>>>  HPI/Subjective: Up in chair eating lunch. Oriented x3. Denies pain/discomfort. NAD  Objective: Filed Vitals:   09/26/12 2105 09/27/12 0219 09/27/12 0500 09/27/12 0918  BP: 123/78 136/76 138/74 114/67  Pulse: 82 65 61 76  Temp: 98.2 F (36.8 C) 98.3 F (36.8 C) 98.4 F (36.9 C) 97.1 F (36.2 C)  TempSrc: Oral Oral Oral Axillary  Resp: 18 20 18 18   Height:      Weight:   72 kg (158 lb 11.7 oz)   SpO2: 95% 96% 98% 100%    Intake/Output Summary (Last 24 hours) at 09/27/12 1239 Last data filed at 09/27/12 1030  Gross per 24 hour  Intake      3 ml  Output    500 ml  Net   -497 ml   Filed Weights   09/25/12 1806 09/26/12 0232 09/27/12 0500  Weight: 68.55 kg (151 lb 2 oz) 68.675 kg (151 lb 6.4 oz) 72 kg (158 lb 11.7 oz)    Exam:   General:  Well nourished. Alert NAD  Cardiovascular: RRR +murmur No LEE PPP  Respiratory: normal effort BSCTAB No wheeze  Abdomen: round soft +BS non-tender to palp  Data Reviewed: Basic Metabolic Panel:  Lab 09/27/12 8295 09/26/12 0015 09/25/12 2357 09/21/12 1334  NA 134* 138 138 139  K 5.8* 5.2* 5.3* 4.1  CL 105 110 106 102  CO2 22 -- 21 25  GLUCOSE 95 111* 112* 82  BUN 28* 31* 29* 40*  CREATININE 1.43* 1.50* 1.54* 1.18*  CALCIUM 8.7 -- 9.1 9.9  MG -- -- -- --  PHOS -- -- -- --   Liver Function Tests:  Lab 09/25/12 2357 09/21/12 1334  AST 20 24  ALT 12 13  ALKPHOS 81 101  BILITOT 0.2* 0.3  PROT 6.6 7.3  ALBUMIN 3.4* 3.7   No results found for this basename: LIPASE:5,AMYLASE:5 in the last 168 hours No results found for this basename: AMMONIA:5 in the last 168 hours CBC:  Lab 09/26/12 0015 09/25/12 2357 09/21/12 1334  WBC -- 10.0 12.0*  NEUTROABS -- -- 8.7*  HGB 12.6 11.9* 13.9  HCT 37.0 36.9 42.0  MCV -- 90.9 89.2  PLT -- 284 329   Cardiac Enzymes: No results found for this  basename: CKTOTAL:5,CKMB:5,CKMBINDEX:5,TROPONINI:5 in the last 168 hours BNP (last 3 results) No results found for this basename: PROBNP:3 in the last 8760 hours CBG: No results found for this basename: GLUCAP:5 in the last 168 hours  Recent Results (from the past 240 hour(s))  URINE CULTURE     Status: Normal   Collection Time   09/21/12  1:55 PM      Component Value Range Status Comment   Specimen Description URINE, CLEAN CATCH   Final    Special Requests NONE   Final    Culture  Setup Time 09/21/2012 16:13   Final    Colony Count 95,000 COLONIES/ML   Final    Culture ESCHERICHIA COLI   Final    Report Status 09/24/2012 FINAL   Final    Organism ID,  Bacteria ESCHERICHIA COLI   Final   URINE CULTURE     Status: Normal   Collection Time   09/26/12  8:38 AM      Component Value Range Status Comment   Specimen Description URINE, RANDOM   Final    Special Requests NONE   Final    Culture  Setup Time 09/26/2012 10:08   Final    Colony Count NO GROWTH   Final    Culture NO GROWTH   Final    Report Status 09/27/2012 FINAL   Final      Studies: Mr Brain Wo Contrast  09/25/2012  *RADIOLOGY REPORT*  Clinical Data: Left facial droop.  Slurred speech.  Fall last week.  MRI HEAD WITHOUT CONTRAST  Technique:  Multiplanar, multiecho pulse sequences of the brain and surrounding structures were obtained according to standard protocol without intravenous contrast.  Comparison: 09/21/2012 CT.  No comparison MR.  Findings:  Moderate size acute/subacute infarct extends from the right lenticular nucleus to the right caudate.  No intracranial hemorrhage.  Mild to moderate small vessel disease type changes.  No hydrocephalus.  No intracranial mass lesion detected on this unenhanced exam.  Mild spinal stenosis with slight cord flattening C2-3 level incompletely assessed on the present exam.  IMPRESSION:  Moderate size acute/subacute infarct extends from the right lenticular nucleus to the right caudate.  No intracranial hemorrhage.  Mild to moderate small vessel disease type changes.  Mild spinal stenosis with slight cord flattening C2-3 level incompletely assessed on the present exam.  The results are presently being called to the referring clinician by [facility] MR technologist with the patient being held for further direction.   Original Report Authenticated By: Lacy Duverney, M.D.     Scheduled Meds:   .  stroke: mapping our early stages of recovery book   Does not apply Once  . aspirin  300 mg Rectal Daily   Or  . aspirin  325 mg Oral Daily  . cefTRIAXone (ROCEPHIN)  IV  1 g Intravenous QHS  . furosemide  20 mg Oral Daily  . losartan  100 mg Oral Daily  .  metoprolol  25 mg Oral BID  . simvastatin  20 mg Oral QPM  . sodium chloride  3 mL Intravenous Q12H  . sodium polystyrene  15 g Oral Once   Continuous Infusions:   Principal Problem:  *CVA (cerebral infarction) Active Problems:  Hypertension  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  Fall at home  UTI (lower urinary tract infection)  Hyperkalemia  Acute renal insufficiency    Time spent: 30 minutes    [facility] M  [facility]  If 8PM-8AM, please contact night-coverage at [email or web address],  password [facility] 09/27/2012, 12:39 PM  LOS: 2 days

## 2012-09-27 NOTE — Progress Notes (Signed)
Patient seen and examined. Agree with note by [name], NP. Plan was to discharge her home today, however her K was found to be 5.8. We have decided to check an EKG, give her some kayexalate and recheck her K in am. Plan has been discussed with patient and her daughter.  Peggye Pitt, MD [facility] Pager: [phone]

## 2012-09-27 NOTE — Progress Notes (Signed)
Vascular Ultrasound Transcranial Doppler has been completed.   09/27/2012 11:30 AM Gertie Fey, RDMS, RDCS

## 2012-09-27 NOTE — Evaluation (Signed)
Occupational Therapy Evaluation Patient Details Name: [name] MRN: [id number] DOB: [birth year] Today's Date: 09/27/2012 Time: 6440-3474 OT Time Calculation (min): 37 min  OT Assessment / Plan / Recommendation Clinical Impression  Pt admitted with left sided weakness. Imaging revealed moderate size acute/subacute infarct extends from the right lenticular nucleus to the right caudate. Pt presents with mild left sided weakness and balance deficits. Pt will benefit from skilled OT in the acute setting to maximize I with ADL and ADL mobility prior to d/c. Recommend HHOT for d/c plan. No DME needs.    OT Assessment  Patient needs continued OT Services    Follow Up Recommendations  Home health OT;Supervision/Assistance - 24 hour    Barriers to Discharge      Equipment Recommendations  None recommended by OT    Recommendations for Other Services    Frequency  Min 2X/week    Precautions / Restrictions Precautions Precautions: Fall   Pertinent Vitals/Pain Pt with no c/o pain. Pt with SOB with activity and states she has "been like this all my life."    ADL  Grooming: Wash/dry face;Wash/dry hands;Supervision/safety Where Assessed - Grooming: Unsupported standing Lower Body Bathing: Minimal assistance Where Assessed - Lower Body Bathing: Unsupported sit to stand Upper Body Dressing: Set up;Supervision/safety Where Assessed - Upper Body Dressing: Unsupported sitting Lower Body Dressing: Moderate assistance Where Assessed - Lower Body Dressing: Unsupported sit to stand Toilet Transfer: Supervision/safety Toilet Transfer Method: Sit to Barista: Regular height toilet;Grab bars Toileting - Clothing Manipulation and Hygiene: Supervision/safety Where Assessed - Engineer, mining and Hygiene: Sit to stand from 3-in-1 or toilet Equipment Used: Gait belt;Rolling walker Transfers/Ambulation Related to ADLs: supervision/min guard A with RW ambulation;  cues to remain with RW during turns and to stay in center as pt with tendency to stay to left inside RW ADL Comments: noted mention of left sided neglect in previous PT note but none noted during OT eval. Pt able to appropriate navigate around obstacles in room and hallway as well as locate objects on left side.     OT Diagnosis: Generalized weakness  OT Problem List: Decreased strength;Decreased activity tolerance;Impaired balance (sitting and/or standing);Decreased knowledge of use of DME or AE;Decreased knowledge of precautions;Impaired UE functional use OT Treatment Interventions: Self-care/ADL training;DME and/or AE instruction;Therapeutic activities;Patient/family education;Balance training   OT Goals Acute Rehab OT Goals OT Goal Formulation: With patient Time For Goal Achievement: 10/04/12 Potential to Achieve Goals: Good ADL Goals Pt Will Perform Grooming: Independently;Standing at sink ADL Goal: Grooming - Progress: Goal set today Pt Will Perform Upper Body Dressing: Independently;Sitting, bed;Sitting, chair ADL Goal: Upper Body Dressing - Progress: Goal set today Pt Will Perform Lower Body Dressing: Independently;Sit to stand from chair;Sit to stand from bed ADL Goal: Lower Body Dressing - Progress: Goal set today Pt Will Transfer to Toilet: with modified independence;Ambulation;with DME ADL Goal: Toilet Transfer - Progress: Goal set today Pt Will Perform Toileting - Clothing Manipulation: Independently;Standing ADL Goal: Toileting - Clothing Manipulation - Progress: Goal set today Pt Will Perform Toileting - Hygiene: Independently;Sit to stand from 3-in-1/toilet ADL Goal: Toileting - Hygiene - Progress: Goal set today Pt Will Perform Tub/Shower Transfer: Tub transfer;with supervision;Ambulation;with DME ADL Goal: Tub/Shower Transfer - Progress: Goal set today  Visit Information  Last OT Received On: 09/27/12    Subjective Data  Subjective: She gave me something that upset  my stomach. Patient Stated Goal: Return home   Prior Functioning     Home Living Lives  With: Alone Available Help at Discharge: Family;Available 24 hours/day Type of Home: House Home Access: Stairs to enter [employer] of Steps: 3 Entrance Stairs-Rails: Can reach both Home Layout: One level Bathroom Shower/Tub: Engineer, manufacturing systems: Standard Home Adaptive Equipment: Bedside commode/3-in-1 Prior Function Level of Independence: Independent Able to Take Stairs?: Yes Driving: Yes Vocation: Retired Musician: No difficulties Dominant Hand: Right         Vision/Perception Vision - Assessment Additional Comments: extraocular ROM intact. mild smooth pursuit deficits in both directions therefore may be premorbid. Fields appear intact with confrontational testing.   Cognition  Overall Cognitive Status: Impaired Area of Impairment: Safety/judgement;Awareness of errors;Awareness of deficits Arousal/Alertness: Awake/alert Orientation Level: Oriented X4 / Intact Behavior During Session: [facility] for tasks performed Safety/Judgement: Decreased safety judgement for tasks assessed Safety/Judgement - Other Comments: pt moves quickly and has decr awareness of left sided weakness Awareness of Errors: Assistance required to identify errors made;Assistance required to correct errors made Awareness of Errors - Other Comments: pt did well ambulating in hallway, however veered left toward wall when entering room and fell toward left side when standing from bed to turn and go to chair; pt states she knew she would have that difficulty but decided to stand and turn anyway. Awareness of Deficits: decr    Extremity/Trunk Assessment Right Upper Extremity Assessment RUE ROM/Strength/Tone: [facility] for tasks assessed Left Upper Extremity Assessment LUE ROM/Strength/Tone: Deficits LUE ROM/Strength/Tone Deficits: decreased wrist and grip strength (grossly 4/5)      Mobility Bed Mobility Bed Mobility: Sit to Supine Sit to Supine: 4: Min guard Details for Bed Mobility Assistance: increased time and pt with difficulty/multiple attempts to get bil legs into the bed. may need assist if bed at home is higher     Shoulder Instructions     Exercise     Balance     End of Session OT - End of Session Equipment Utilized During Treatment: Gait belt Activity Tolerance: Patient tolerated treatment well (however, easily winded) Patient left: in bed;with call bell/phone within reach;with family/visitor present Nurse Communication: Mobility status  GO     Jood Retana 09/27/2012, 3:28 PM

## 2012-09-27 NOTE — Progress Notes (Signed)
PT Cancellation Note  Patient Details Name: [name] MRN: [id number] DOB: 16-Mar-1923   Cancelled Treatment:    Reason Eval/Treat Not Completed: Patient at procedure or test/unavailable. Will attempt back as time and schedule allow.    [facility] HELEN 09/27/2012, 11:13 AM Pager: [phone]

## 2012-09-27 NOTE — Progress Notes (Signed)
Physical Therapy Treatment Patient Details Name: [name] MRN: [id number] DOB: 1923-02-25 Today's Date: 09/27/2012 Time: 1191-4782 PT Time Calculation (min): 32 min  PT Assessment / Plan / Recommendation Comments on Treatment Session  Did not notice significant left inattention today, able to scan appropriately in the hallway and not bumping into objects. Does have some difficulty maintaining grip on the left on RW. Educated patient and her daughter on safety at home. Plans for 24 hour supervision provided by family.     Follow Up Recommendations  Home health PT;Supervision for mobility/OOB     Does the patient have the potential to tolerate intense rehabilitation     Barriers to Discharge        Equipment Recommendations  Rolling walker with 5" wheels    Recommendations for Other Services    Frequency Min 3X/week   Plan Discharge plan remains appropriate;Frequency remains appropriate    Precautions / Restrictions Precautions Precautions: Fall       Mobility  Bed Mobility Bed Mobility: Sit to Supine Sit to Supine: 4: Min guard Details for Bed Mobility Assistance: increased time and pt with difficulty/multiple attempts to get bil legs into the bed. may need assist if bed at home is higher Transfers Transfers: Sit to Stand;Stand to Sit Sit to Stand: 4: Min guard;With upper extremity assist;With armrests Stand to Sit: With upper extremity assist;With armrests;To bed;4: Min guard Details for Transfer Assistance: gaurding for safety and cues for hand placement; set up with RW Ambulation/Gait Ambulation/Gait Assistance: 4: Min guard Ambulation Distance (Feet): 300 Feet Assistive device: Rolling walker;None Ambulation/Gait Assistance Details: cues for positioning within RW as pt hangs out on the left side of RW with decreased awareness; attempted ambulation without RW and pt drifts to the left  Gait Pattern: Step-through pattern;Decreased stride length;Decreased step length  - left      PT Goals Acute Rehab PT Goals PT Goal: Sit to Stand - Progress: Progressing toward goal PT Goal: Stand to Sit - Progress: Progressing toward goal PT Transfer Goal: Bed to Chair/Chair to Bed - Progress: Progressing toward goal PT Goal: Stand - Progress: Progressing toward goal PT Goal: Ambulate - Progress: Progressing toward goal  Visit Information  Last PT Received On: 09/27/12 Assistance Needed: +1    Subjective Data  Subjective: Im not hurting.   Cognition  Overall Cognitive Status: Impaired Area of Impairment: Safety/judgement;Awareness of errors;Awareness of deficits Arousal/Alertness: Awake/alert Orientation Level: Oriented X4 / Intact Behavior During Session: [facility] for tasks performed Safety/Judgement: Decreased safety judgement for tasks assessed Safety/Judgement - Other Comments: pt moves quickly and has decr awareness of left sided weakness Awareness of Errors: Assistance required to identify errors made;Assistance required to correct errors made Awareness of Errors - Other Comments: pt did well ambulating in hallway, however veered left toward wall when entering room and fell toward left side when standing from bed to turn and go to chair; pt states she knew she would have that difficulty but decided to stand and turn anyway. Awareness of Deficits: decr    Balance     End of Session PT - End of Session Equipment Utilized During Treatment: Gait belt Activity Tolerance: Patient tolerated treatment well Patient left: in bed;with call bell/phone within reach Nurse Communication: Mobility status   GP     [facility] HELEN 09/27/2012, 3:37 PM

## 2012-09-27 NOTE — Progress Notes (Signed)
   CARE MANAGEMENT NOTE 09/27/2012  Patient:  [name], [name]   Account Number:  [id number]  Date Initiated:  09/26/2012  Documentation initiated by:  Donn Pierini  Subjective/Objective Assessment:   Pt admitted with acute CVA     Action/Plan:   PTA pt lived at home- PT/OT evals- NCM to follow for recommendations   Anticipated DC Date:  09/28/2012   Anticipated DC Plan:  HOME W HOME HEALTH SERVICES      DC Planning Services  CM consult      [facility] Choice  HOME HEALTH   Choice offered to / List presented to:  C-1 Patient           HH agency  [facility]   Status of service:  In process, will continue to follow Medicare Important Message given?   (If response is "NO", the following Medicare IM given date fields will be blank) Date Medicare IM given:   Date Additional Medicare IM given:    Discharge Disposition:    Per UR Regulation:  Reviewed for med. necessity/level of care/duration of stay  If discussed at Long Length of Stay Meetings, dates discussed:    Comments:  09/27/12- 1600- Donn Pierini RN, BSN [phone] Per PT notes recommending HH services for PT/OT- MD to place orders- spoke with pt at bedside- per conversation pt states that she has used [facility] in past and would like to use them again for any services. Will await orders and place referral when orders in chart. Pt states that she plans to borrow a RW from her church- therefore will not need one prior to discharge. NCM to cont. to follow

## 2012-09-27 NOTE — Progress Notes (Signed)
Stroke Team Progress Note  HISTORY [name] is a 78 y.o. female with a history of several falls last week. She was seen in the ED where it was noticed that she had a left facial droop, and an MRI was suggested, and the patient elected to pursue this as an outpatient. She denies left sided weakness. Patient was not a TPA candidate secondary to delay in arrival. She was admitted for further evaluation and treatment.  SUBJECTIVE Patient up in chair at bedside. No new complaints.  OBJECTIVE Most recent Vital Signs: Filed Vitals:   09/26/12 2105 09/27/12 0219 09/27/12 0500 09/27/12 0918  BP: 123/78 136/76 138/74 114/67  Pulse: 82 65 61 76  Temp: 98.2 F (36.8 C) 98.3 F (36.8 C) 98.4 F (36.9 C) 97.1 F (36.2 C)  TempSrc: Oral Oral Oral Axillary  Resp: 18 20 18 18   Height:      Weight:   72 kg (158 lb 11.7 oz)   SpO2: 95% 96% 98% 100%   CBG (last 3)  No results found for this basename: GLUCAP:3 in the last 72 hours  IV Fluid Intake:     MEDICATIONS     . aspirin  300 mg Rectal Daily   Or  . aspirin  325 mg Oral Daily  . cefTRIAXone (ROCEPHIN)  IV  1 g Intravenous QHS  . furosemide  20 mg Oral Daily  . losartan  100 mg Oral Daily  . metoprolol  25 mg Oral BID  . simvastatin  20 mg Oral QPM  . sodium chloride  3 mL Intravenous Q12H   PRN:  sodium chloride, acetaminophen, oxyCODONE, sodium chloride  Diet:  General thin liquids Activity:  OOB with assistance DVT Prophylaxis:  SCDs ordered  CLINICALLY SIGNIFICANT STUDIES Basic Metabolic Panel:   Lab 09/27/12 0529 09/26/12 0015 09/25/12 2357  NA 134* 138 --  K 5.8* 5.2* --  CL 105 110 --  CO2 22 -- 21  GLUCOSE 95 111* --  BUN 28* 31* --  CREATININE 1.43* 1.50* --  CALCIUM 8.7 -- 9.1  MG -- -- --  PHOS -- -- --   Liver Function Tests:   Lab 09/25/12 2357 09/21/12 1334  AST 20 24  ALT 12 13  ALKPHOS 81 101  BILITOT 0.2* 0.3  PROT 6.6 7.3  ALBUMIN 3.4* 3.7   CBC:   Lab 09/26/12 0015 09/25/12 2357  09/21/12 1334  WBC -- 10.0 12.0*  NEUTROABS -- -- 8.7*  HGB 12.6 11.9* --  HCT 37.0 36.9 --  MCV -- 90.9 89.2  PLT -- 284 329   Coagulation: No results found for this basename: LABPROT:4,INR:4 in the last 168 hours Cardiac Enzymes: No results found for this basename: CKTOTAL:3,CKMB:3,CKMBINDEX:3,TROPONINI:3 in the last 168 hours Urinalysis:   Lab 09/21/12 1355  COLORURINE YELLOW  LABSPEC 1.021  PHURINE 5.0  GLUCOSEU NEGATIVE  HGBUR NEGATIVE  BILIRUBINUR NEGATIVE  KETONESUR NEGATIVE  PROTEINUR NEGATIVE  UROBILINOGEN 0.2  NITRITE POSITIVE*  LEUKOCYTESUR SMALL*   Lipid Panel    Component Value Date/Time   CHOL 162 09/27/2012 0529   TRIG 155* 09/27/2012 0529   HDL 49 09/27/2012 0529   CHOLHDL 3.3 09/27/2012 0529   VLDL 31 09/27/2012 0529   LDLCALC 82 09/27/2012 0529   HgbA1C  Lab Results  Component Value Date   HGBA1C 6.3* 09/26/2012   Urine Drug Screen:   No results found for this basename: labopia,  cocainscrnur,  labbenz,  amphetmu,  thcu,  labbarb  Alcohol Level: No results found for this basename: ETH:2 in the last 168 hours  CT of the brain    MRI of the brain   09/25/2012  Moderate size acute/subacute infarct extends from the right lenticular nucleus to the right caudate.  No intracranial hemorrhage.  Mild to moderate small vessel disease type changes.  Mild spinal stenosis with slight cord flattening C2-3 level incompletely assessed on the present exam.  MRA of the brain  See TCD  2D Echocardiogram  EF 55-60% with no source of embolus.   Carotid Doppler  No evidence of hemodynamically significant internal carotid artery stenosis. Vertebral artery flow is antegrade.   CXR  09/21/2012 no active disease  EKG  normal sinus rhythm.   TCD    Therapy Recommendations home health PT  Physical Exam   Pleasant elderly lady not in distress.Awake alert. Afebrile. Head is nontraumatic. Neck is supple without bruit. Hearing is normal. Cardiac exam no murmur or  gallop. Lungs are clear to auscultation. Distal pulses are well felt. Neurological Exam : Awake alert oriented x 3 normal speech and language.fundi not visualized. Vision acuity and fields appear normal the Mild left lower face asymmetry. Tongue midline. No drift. Mild diminished fine finger movements on left. Orbits right over left upper extremity. Mild left grip weak.. Normal sensation . Normal coordination.  ASSESSMENT Ms. [name] is a 78 y.o. female presenting with left facial weakness. 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 Infarct felt to be thrombotic secondary to small vessel disease.  Work up completed. On aspirin 81 mg orally every day prior to admission. Now on aspirin 325 mg orally every day for secondary stroke prevention. Patient with resultant left sided weakness. Home health PT recommended.  Hypertension Hyperlipidemia, LDL 91, on statin PTA, at goal LDL < 100  Hospital day # 2  TREATMENT/PLAN  Continue aspirin 325 mg orally every day for secondary stroke prevention.  Dr Pearlean Brownie will read TCD later today.  Ok for discharge from neuro standpoint  Home health PT Stroke Service will sign off. Follow up with Dr. Pearlean Brownie, Stroke Clinic, in 2 months.  Dr. Pearlean Brownie discussed diagnosis, prognosis and plan of care with patient and Paula Smothers, NP.    Annie Main, MSN, RN, ANVP-BC, ANP-BC, GNP-BC [facility] Pager: [phone] 09/27/2012 9:23 AM  I have personally obtained a history, examined the patient, evaluated imaging results, and formulated the assessment and plan of care. I agree with the above.    Delia Heady, MD Medical Director [facility] Pager: [phone] 09/27/2012 6:30 PM

## 2012-09-27 NOTE — Progress Notes (Signed)
Speech Language Pathology Dysphagia Treatment Patient Details Name: [name] MRN: [id number] DOB: 02/10/1923 Today's Date: 09/27/2012 Time: 1478-2956 SLP Time Calculation (min): 10 min  Assessment / Plan / Recommendation Clinical Impression  F/u after yesterday's swallow eval, which revealed only minor issues with airway protection.  Pt able to verbalize swallowing strategies to decrease bolus size and avoid straws with liquids.  She reports occasional coughing with liquids; airway protection appears to be sufficient during today's observation with simple steps in place.  Pt with overall adequate toleration - SLP to sign-off; no f/u required.      Diet Recommendation  Continue with Current Diet: Regular;Thin liquid    SLP Plan All goals met   Pertinent Vitals/Pain No pain   Swallowing Goals  SLP Swallowing Goals Patient will consume recommended diet without observed clinical signs of aspiration with: Modified independent assistance Swallow Study Goal #1 - Progress: Met Patient will utilize recommended strategies during swallow to increase swallowing safety with: Modified independent assistance Swallow Study Goal #2 - Progress: Met  General Temperature Spikes Noted: No Respiratory Status: Room air Behavior/Cognition: Alert;Cooperative;Pleasant mood Oral Cavity - Dentition: Adequate natural dentition Patient Positioning: Other (comment) (standing at sink)  Oral Cavity - Oral Hygiene Does patient have any of the following "at risk" factors?: None of the above Brush patient's teeth BID with toothbrush (using toothpaste with fluoride): Yes   Dysphagia Treatment Treatment focused on: Skilled observation of diet tolerance;Patient/family/caregiver education Treatment Methods/Modalities: Skilled observation Feeding: Able to feed self Liquids provided via: Cup Pharyngeal Phase Signs & Symptoms: Delayed throat clear Type of cueing: Verbal Amount of cueing: Minimal : mod I by end of  session  GO    Chantee Cerino L. Samson Frederic, [place] CCC/SLP Pager [phone]  Blenda Mounts Laurice 09/27/2012, 1:26 PM

## 2012-09-27 NOTE — Evaluation (Signed)
Agree with PT evaluation.  Emalia Witkop, PT DPT [phone]  

## 2012-09-28 LAB — BASIC METABOLIC PANEL
CO2: 23 mEq/L (ref 19–32)
Calcium: 8.6 mg/dL (ref 8.4–10.5)
Chloride: 107 mEq/L (ref 96–112)
Potassium: 4.8 mEq/L (ref 3.5–5.1)
Sodium: 138 mEq/L (ref 135–145)

## 2012-09-28 MED ORDER — FUROSEMIDE 20 MG PO TABS: 20.0000 mg | ORAL_TABLET | Freq: Every day | ORAL | Status: DC

## 2012-09-28 MED ORDER — FUROSEMIDE 20 MG PO TABS: 20.0000 mg | ORAL_TABLET | Freq: Two times a day (BID) | ORAL | Status: DC

## 2012-09-28 MED ORDER — PNEUMOCOCCAL VAC POLYVALENT 25 MCG/0.5ML IJ INJ
0.5000 mL | INJECTION | INTRAMUSCULAR | Status: AC
  Administered 2012-09-28: 0.5 mL via INTRAMUSCULAR
  Filled 2012-09-28: qty 0.5

## 2012-09-28 MED ORDER — ASPIRIN 325 MG PO TABS: 325.0000 mg | ORAL_TABLET | Freq: Every day | ORAL | Status: DC

## 2012-09-28 MED ORDER — PNEUMOCOCCAL VAC POLYVALENT 25 MCG/0.5ML IJ INJ: 0.5000 mL | INJECTION | INTRAMUSCULAR | Status: DC

## 2012-09-28 NOTE — Discharge Summary (Signed)
Physician Discharge Summary  [name]:[id number] DOB: 1922-11-05 DOA: 09/25/2012  PCP: [name], MD  Admit date: 09/25/2012 Discharge date: 09/28/2012  Time spent: 40 minutes  Recommendations for Outpatient Follow-up:  1. Pt being discharged to home with family. Will have HH PT/OT. Will follow up with Dr Pearlean Brownie in 2 months 2. Recommend follow up with PCP in 1week. Recommend bmet to evaluate potassium level. Also recommend evaluation of BP as lasix dose decreased.   Discharge Diagnoses:  Principal Problem:  *CVA (cerebral infarction) Active Problems:  Hypertension  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  Fall at home  UTI (lower urinary tract infection)  Hyperkalemia  Acute renal insufficiency   Discharge Condition: stable and medically ready for discharge  Diet recommendation: heart healthy  Filed Weights   09/25/12 1806 09/26/12 0232 09/27/12 0500  Weight: 68.55 kg (151 lb 2 oz) 68.675 kg (151 lb 6.4 oz) 72 kg (158 lb 11.7 oz)    History of present illness:   [name] is a delightful 78 yo female with hx HTN, gout. Arthritis who was seen the week prior to presentation on 09/26/12 in ED with uti and left facial droop.  At that time she was started on abx and outpt mri was arranged for possible cva. Was placed on asa 324mg  daily.  In addition she had fallen several times in the prior week and still had some bruising over the left side of her face. Over the previous week her left facial droop had improved. She was having some slurred speech but that had resolved. She was also having some left sided weakness but that had also resolved. Results of Mri on day of presentation did show acute cva. Pt was sent to ED from pcp for further evaluation. She deneid any fevers/n/v/d. No cp no sob. No headache no blurred    Hospital Course:  CVA (cerebral infarction) per OP MRI. Pt admitted to tele. Seen by  Neuro. Lipids WNL. Echo yields 70% EF with mild LVD. Carotid doppler yields  bilateral no evidence of significant internal carotid artery stenosis. Continue asa at 325mg , statin, BB, ARB. SBP range 114-132 .  Active Problems:  Acute renal insufficiency: likely related to decreased po intake with some chronic component. Gently hydrated with IV fluids. Nephrotoxins held. Initially lasix held and then resumed at half home dose.  Creatinine steadily trended downward. At discharge creatinine 1.18 which seems to be baseline. Recommend bmet in 1 week to trend.   Hyperkalemia: potassium slowly trended upward during this hospitalization. Not on supplement. 5g kayexelate given and lasix resumed at half home dose due to BP. No changes in EKG. At discharge potassium 4.8. Recommend bmet in 1 week to trend.   Hypertension: SBP range 114-136.  Initially home lasix held to allow for permissive HTN.  Continued ARB, BB with good control. Lasix at half home dose i.e. 20mg  resumed 09/26/12 due to #3. At time of discharge SBP range 114-132.    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: resolved   Fall at home : likely related to #1. PT/OT evaluated/treat. Will have HH PT/OT. Rolling walker recommended. Pt acquiring.   UTI (lower urinary tract infection: culture with no growth.  Afebrile. WC WNL. Non-toxic appearing. At discharge will have received 3 days of rocephin.      Procedures:  none  Consultations:  neuro  Discharge Exam: Filed Vitals:   09/27/12 2000 09/28/12 0000 09/28/12 0500 09/28/12 0800  BP: 130/68 122/80 114/68 132/69  Pulse: 67 72 65 67  Temp: 97.7 F (36.5 C) 98.4 F (36.9 C) 98.1 F (36.7 C) 98 F (36.7 C)  TempSrc:  Oral    Resp: 18 18 18 18   Height:      Weight:      SpO2: 99% 96% 96% 100%    General: awake alert ambulating in room with walker/assistance. Gait fairly steady Cardiovascular: RRR +Murmur No LEE PPP Respiratory: normal effort BSCTAB No wheeze/rhonchi  Discharge Instructions  Discharge Orders    Future Appointments: Provider: Department: Dept  Phone: Center:   10/30/2012 11:50 AM [facility] [phone] [facility]     Future Orders Please Complete By Expires   Diet - low sodium heart healthy      Increase activity slowly      Call MD for:  persistant nausea and vomiting      Call MD for:  difficulty breathing, headache or visual disturbances      Call MD for:  persistant dizziness or light-headedness          Medication List     As of 09/28/2012  9:49 AM    STOP taking these medications         sulfamethoxazole-trimethoprim 800-160 MG per tablet   Commonly known as: BACTRIM DS,SEPTRA DS      TAKE these medications         alendronate 70 MG tablet   Commonly known as: FOSAMAX   Take 70 mg by mouth every 7 (seven) days. Take with a full glass of water on an empty stomach.  On Mondays      aspirin 325 MG tablet   Take 1 tablet (325 mg total) by mouth daily.      CALTRATE 600+D PO   Take 1 tablet by mouth 2 (two) times daily.      furosemide 20 MG tablet   Commonly known as: LASIX   Take 1 tablet (20 mg total) by mouth daily.      losartan 100 MG tablet   Commonly known as: COZAAR   Take 100 mg by mouth daily.      metoprolol 50 MG tablet   Commonly known as: LOPRESSOR   Take 25 mg by mouth 2 (two) times daily.      simvastatin 20 MG tablet   Commonly known as: ZOCOR   Take 20 mg by mouth every evening.      SYSTANE OP   Apply 1 drop to eye 2 (two) times daily.      triamcinolone cream 0.1 %   Commonly known as: KENALOG   Apply 1 application topically daily as needed. For rash           Follow-up Information    Follow up with Gates Rigg, MD. Schedule an appointment as soon as possible for a visit in 2 months.   Contact information:   [address], [address] [facility] [place] [address] [phone]       Follow up with [facility], MD. In 1 week. (will need BMET to assess potassium level. recommend assessment of BP  as lasix dose reduced this hospitalization)    Contact information:   [address] [place] [address]           The results of significant diagnostics from this hospitalization (including imaging, microbiology, ancillary and laboratory) are listed below for reference.    Significant Diagnostic Studies: Dg Chest 2 View  09/21/2012  *RADIOLOGY REPORT*  Clinical  Data: Fall.  Chest pain.  Hypertension.  CHEST - 2 VIEW  Comparison: 12/18/2008  Findings: Heart size is within normal limits.  Both lungs are clear.  No evidence of pneumothorax or pleural effusion.  No mass or lymphadenopathy identified.  IMPRESSION: No active disease.   Original Report Authenticated By: Myles Rosenthal, M.D.    Ct Head Wo Contrast  09/21/2012  *RADIOLOGY REPORT*  Clinical Data:  Injury after fall.  CT HEAD WITHOUT CONTRAST CT MAXILLOFACIAL WITHOUT CONTRAST  Technique:  Multidetector CT imaging of the head and maxillofacial structures were performed using the standard protocol without intravenous contrast. Multiplanar CT image reconstructions of the maxillofacial structures were also generated.  Comparison:   None.  CT HEAD  Findings: Bony calvarium appears intact.  Mild diffuse cortical atrophy is noted. Low density is seen involving right periventricular white matter most consistent with old infarction. No mass effect or midline shift is noted.  Ventricular size is within normal limits. There is no evidence of mass lesion, hemorrhage or acute infarction.  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  No acute intracranial abnormality seen.  CT MAXILLOFACIAL  Findings:   No fracture or other bony abnormality is noted. Paranasal sinuses appear normal.  Globes and orbits appear normal.  IMPRESSION: No abnormalities seen in the maxillofacial region.   Original Report Authenticated By: Lupita Raider.,  M.D.    Mr Brain Wo Contrast  09/25/2012  *RADIOLOGY REPORT*  Clinical  Data: Left facial droop.  Slurred speech.  Fall last week.  MRI HEAD WITHOUT CONTRAST  Technique:  Multiplanar, multiecho pulse sequences of the brain and surrounding structures were obtained according to standard protocol without intravenous contrast.  Comparison: 09/21/2012 CT.  No comparison MR.  Findings:  Moderate size acute/subacute infarct extends from the right lenticular nucleus to the right caudate.  No intracranial hemorrhage.  Mild to moderate small vessel disease type changes.  No hydrocephalus.  No intracranial mass lesion detected on this unenhanced exam.  Mild spinal stenosis with slight cord flattening C2-3 level incompletely assessed on the present exam.  IMPRESSION:  Moderate size acute/subacute infarct extends from the right lenticular nucleus to the right caudate.  No intracranial hemorrhage.  Mild to moderate small vessel disease type changes.  Mild spinal stenosis with slight cord flattening C2-3 level incompletely assessed on the present exam.  The results are presently being called to the referring clinician by [facility] MR technologist with the patient being held for further direction.   Original Report Authenticated By: Lacy Duverney, M.D.    Dg Knee Complete 4 Views Left  09/21/2012  *RADIOLOGY REPORT*  Clinical Data: Fall.  Knee injury and pain.  LEFT KNEE - COMPLETE 4+ VIEW  Comparison: 05/15/2008  Findings: Total knee arthroplasty is seen with components in expected position.  No evidence of hardware failure or loosening. No evidence of acute fracture or dislocation.  No evidence of knee joint effusion.  IMPRESSION: No acute findings.   Original Report Authenticated By: Myles Rosenthal, M.D.    Ct Maxillofacial Wo Cm  09/21/2012  *RADIOLOGY REPORT*  Clinical Data:  Injury after fall.  CT HEAD WITHOUT CONTRAST CT MAXILLOFACIAL WITHOUT CONTRAST  Technique:  Multidetector CT imaging of the head and maxillofacial structures were performed using the standard protocol without intravenous  contrast. Multiplanar CT image reconstructions of the maxillofacial structures were also generated.  Comparison:   None.  CT HEAD  Findings: Bony calvarium appears intact.  Mild diffuse cortical atrophy is noted. Low  density is seen involving right periventricular white matter most consistent with old infarction. No mass effect or midline shift is noted.  Ventricular size is within normal limits. There is no evidence of mass lesion, hemorrhage or acute infarction.  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  No acute intracranial abnormality seen.  CT MAXILLOFACIAL  Findings:   No fracture or other bony abnormality is noted. Paranasal sinuses appear normal.  Globes and orbits appear normal.  IMPRESSION: No abnormalities seen in the maxillofacial region.   Original Report Authenticated By: Lupita Raider.,  M.D.     Microbiology: Recent Results (from the past 240 hour(s))  URINE CULTURE     Status: Normal   Collection Time   09/21/12  1:55 PM      Component Value Range Status Comment   Specimen Description URINE, CLEAN CATCH   Final    Special Requests NONE   Final    Culture  Setup Time 09/21/2012 16:13   Final    Colony Count 95,000 COLONIES/ML   Final    Culture ESCHERICHIA COLI   Final    Report Status 09/24/2012 FINAL   Final    Organism ID, Bacteria ESCHERICHIA COLI   Final   URINE CULTURE     Status: Normal   Collection Time   09/26/12  8:38 AM      Component Value Range Status Comment   Specimen Description URINE, RANDOM   Final    Special Requests NONE   Final    Culture  Setup Time 09/26/2012 10:08   Final    Colony Count NO GROWTH   Final    Culture NO GROWTH   Final    Report Status 09/27/2012 FINAL   Final      Labs: Basic Metabolic Panel:  Lab 09/28/12 7846 09/27/12 0529 09/26/12 0015 09/25/12 2357 09/21/12 1334  NA 138 134* 138 138 139  K 4.8 5.8* 5.2* 5.3* 4.1  CL 107 105 110 106 102  CO2 23 22 -- 21 25  GLUCOSE 96 95 111* 112*  82  BUN 25* 28* 31* 29* 40*  CREATININE 1.18* 1.43* 1.50* 1.54* 1.18*  CALCIUM 8.6 8.7 -- 9.1 9.9  MG -- -- -- -- --  PHOS -- -- -- -- --   Liver Function Tests:  Lab 09/25/12 2357 09/21/12 1334  AST 20 24  ALT 12 13  ALKPHOS 81 101  BILITOT 0.2* 0.3  PROT 6.6 7.3  ALBUMIN 3.4* 3.7   No results found for this basename: LIPASE:5,AMYLASE:5 in the last 168 hours No results found for this basename: AMMONIA:5 in the last 168 hours CBC:  Lab 09/26/12 0015 09/25/12 2357 09/21/12 1334  WBC -- 10.0 12.0*  NEUTROABS -- -- 8.7*  HGB 12.6 11.9* 13.9  HCT 37.0 36.9 42.0  MCV -- 90.9 89.2  PLT -- 284 329   Cardiac Enzymes: No results found for this basename: CKTOTAL:5,CKMB:5,CKMBINDEX:5,TROPONINI:5 in the last 168 hours BNP: BNP (last 3 results) No results found for this basename: PROBNP:3 in the last 8760 hours CBG: No results found for this basename: GLUCAP:5 in the last 168 hours     Signed:  Gwenyth Bender  [facility] 09/28/2012, 9:49 AM

## 2012-09-28 NOTE — Care Management Note (Signed)
    Page 1 of 2   09/28/2012     11:05:16 AM   CARE MANAGEMENT NOTE 09/28/2012  Patient:  [name], [name]   Account Number:  [id number]  Date Initiated:  09/26/2012  Documentation initiated by:  Donn Pierini  Subjective/Objective Assessment:   Pt admitted with acute CVA     Action/Plan:   PTA pt lived at home- PT/OT evals- NCM to follow for recommendations   Anticipated DC Date:  09/28/2012   Anticipated DC Plan:  HOME W HOME HEALTH SERVICES      DC Planning Services  CM consult      [facility] Choice  HOME HEALTH   Choice offered to / List presented to:  C-1 Patient        HH arranged  HH-3 OT  HH-2 PT      HH agency  [facility]   Status of service:  Completed, signed off Medicare Important Message given?   (If response is "NO", the following Medicare IM given date fields will be blank) Date Medicare IM given:   Date Additional Medicare IM given:    Discharge Disposition:  HOME W HOME HEALTH SERVICES  Per UR Regulation:  Reviewed for med. necessity/level of care/duration of stay  If discussed at Long Length of Stay Meetings, dates discussed:    Comments:  09-28-12 [address] RN,BSN [phone] CM did receive orders for [facility] services. CM did make referral for [facility] services listed above. SOC to beign within 24-48 hours post d/c.No further needs from CM at this  time.   09/27/12- 1600- Donn Pierini RN, BSN [phone] Per PT notes recommending HH services for PT/OT- MD to place orders- spoke with pt at bedside- per conversation pt states that she has used [facility] in past and would like to use them again for any services. Will await orders and place referral when orders in chart. Pt states that she plans to borrow a RW from her church- therefore will not need one prior to discharge. NCM to cont. to follow

## 2012-09-28 NOTE — Progress Notes (Signed)
  Transcranial dopplers reviewed from 12/19/2013Normal mean flow velocities in anterior and posterior cerebral circulations. Globally elevated pulsatility indices suggest diffuse intracranial atherosclerosis. Discussed with Dr Pearlean Brownie. No new recommendations.  [name] Manson Passey, [facility], MBA, MHA [facility] Pager: [phone] 09/28/2012 11:34 AM

## 2012-09-28 NOTE — Progress Notes (Signed)
Physical Therapy Treatment Patient Details Name: [name] MRN: [id number] DOB: 1922/12/11 Today's Date: 09/28/2012 Time: 1308-6578 PT Time Calculation (min): 19 min  PT Assessment / Plan / Recommendation Comments on Treatment Session  Pt admit with CVA with decr mobility secondary to continued occasional issues with inattention to left and balance issues.  Does well overall with RW.  No problems with grip on RW today.  Plans for 24 hour supervision by family.      Follow Up Recommendations  Home health PT;Supervision for mobility/OOB                 Equipment Recommendations  Rolling walker with 5" wheels    Recommendations for Other Services  None  Frequency Min 3X/week   Plan Discharge plan remains appropriate;Frequency remains appropriate    Precautions / Restrictions Precautions Precautions: Fall Precaution Comments: 4 falls in last week; 1 additional fall in past year Restrictions Weight Bearing Restrictions: No   Pertinent Vitals/Pain VSS, No pain    Mobility  Bed Mobility Bed Mobility: Sit to Supine Supine to Sit: Not tested (comment) Sitting - Scoot to Edge of Bed: Not tested (comment) Sit to Supine: 4: Min assist Details for Bed Mobility Assistance: Assisted patient in placing LEs on bed.   Transfers Transfers: Sit to Stand;Stand to Sit Sit to Stand: 4: Min guard;With upper extremity assist;With armrests;From chair/3-in-1 Stand to Sit: 4: Min guard;With upper extremity assist;To bed Details for Transfer Assistance: cues not needed for hand placement or with set up with RW Ambulation/Gait Ambulation/Gait Assistance: 4: Min guard Ambulation Distance (Feet): 300 Feet Assistive device: Rolling walker Ambulation/Gait Assistance Details: no cues needed.  Good RW safety.  When patient did veer left into the doorway, she self corrected and was steady.  Pt ambulated to bathroom first and then ambulated in hallway.   Gait Pattern: Step-through pattern;Decreased  stride length;Decreased step length - left Gait velocity: improved Stairs: No Wheelchair Mobility Wheelchair Mobility: No Modified Rankin (Stroke Patients Only) Pre-Morbid Rankin Score: No symptoms Modified Rankin: Moderately severe disability     PT Goals Acute Rehab PT Goals PT Goal: Sit to Stand - Progress: Progressing toward goal PT Goal: Stand to Sit - Progress: Progressing toward goal PT Transfer Goal: Bed to Chair/Chair to Bed - Progress: Progressing toward goal PT Goal: Stand - Progress: Progressing toward goal PT Goal: Ambulate - Progress: Progressing toward goal  Visit Information  Last PT Received On: 09/28/12 Assistance Needed: +1    Subjective Data  Subjective: "I want to go to the bathroom and brush my teeth." Patient Stated Goal: To go home   Cognition  Overall Cognitive Status: Impaired Area of Impairment: Safety/judgement;Awareness of errors;Awareness of deficits Arousal/Alertness: Awake/alert Orientation Level: Oriented X4 / Intact Behavior During Session: [facility] for tasks performed Safety/Judgement: Decreased safety judgement for tasks assessed Safety/Judgement - Other Comments: pt moves quickly and decr awareness of left sided weakness Awareness of Errors: Assistance required to identify errors made;Assistance required to correct errors made Awareness of Errors - Other Comments: Pt improving with ambulating in hallway, veered left only once but did not lose balance and self corrected.      Balance  Static Sitting Balance Static Sitting - Balance Support: No upper extremity supported;Feet supported Static Sitting - Level of Assistance: 7: Independent Static Standing Balance Static Standing - Balance Support: Bilateral upper extremity supported;During functional activity Static Standing - Level of Assistance: 5: Stand by assistance Static Standing - Comment/# of Minutes: Pt stood at sink and  washed hands and brushed teeth and maintained balance.    End of  Session PT - End of Session Equipment Utilized During Treatment: Gait belt Activity Tolerance: Patient tolerated treatment well Patient left: in bed;with call bell/phone within reach;with family/visitor present (granddaughter came in and PT adjusted patient's RW)        INGOLD,Bonnell Placzek 09/28/2012, 10:23 AM  [facility] [phone] (pager)

## 2012-09-28 NOTE — Discharge Summary (Signed)
Patient seen and examined. Agree with note by [name], NP. She will be discharged home today in stable and improved condition.  Peggye Pitt, MD [facility] Pager: [phone]

## 2012-09-28 NOTE — Progress Notes (Signed)
Reassessment not done; RN off-unit for STAT CT of head [phone]

## 2012-09-28 NOTE — Progress Notes (Signed)
Occupational Therapy Treatment Patient Details Name: [name] MRN: [id number] DOB: 12-09-1922 Today's Date: 09/28/2012 Time: 1105-1150 OT Time Calculation (min): 45 min  OT Assessment / Plan / Recommendation Comments on Treatment Session  Patient engaged in dressing, grooming, toileting, and transfer toilet and recliner.  Patient and daughter report that patient will discharge this afternoon therefore session included dressing in street clothes to prepare for discharge.    Follow Up Recommendations    HHOT   Barriers to Discharge   will have 24/7 per daughter    Equipment Recommendations   Patient will benefit from a tub bench and daughter reports that she will purchase one after discharge   Recommendations for Other Services  none  Frequency  n/a due to discharge today  Plan   HHOT   Precautions / Restrictions Precautions Precautions: Fall Precaution Comments: Recent H/O falls Restrictions Weight Bearing Restrictions: No   Pertinent Vitals/Pain Denies pain    OT Goals Acute Rehab OT Goals OT Goal Formulation: With patient Time For Goal Achievement: 10/04/12 Potential to Achieve Goals: Good ADL Goals Pt Will Perform Grooming: Independently;Standing at sink ADL Goal: Grooming - Progress: Progressing toward goals Pt Will Perform Upper Body Dressing: Independently;Sitting, bed;Sitting, chair ADL Goal: Upper Body Dressing - Progress: Progressing toward goals Pt Will Perform Lower Body Dressing: Independently;Sit to stand from chair;Sit to stand from bed ADL Goal: Lower Body Dressing - Progress: Progressing toward goals Pt Will Transfer to Toilet: with modified independence;Ambulation;with DME ADL Goal: Toilet Transfer - Progress: Progressing toward goals Pt Will Perform Toileting - Clothing Manipulation: Independently;Standing ADL Goal: Toileting - Clothing Manipulation - Progress: Progressing toward goals Pt Will Perform Toileting - Hygiene: Independently;Sit to stand  from 3-in-1/toilet ADL Goal: Toileting - Hygiene - Progress: Progressing toward goals Pt Will Perform Tub/Shower Transfer: Tub transfer;with supervision;Ambulation;with DME ADL Goal: Tub/Shower Transfer - Progress:  (not attempted today)  Visit Information  Last OT Received On: 09/28/12 Assistance Needed: +1    Subjective Data  Subjective: I am happy to be going home this afternoon Patient Stated Goal: return home      Cognition  Overall Cognitive Status: Appears within functional limits for tasks assessed/performed Area of Impairment: Safety/judgement;Awareness of errors;Awareness of deficits Arousal/Alertness: Awake/alert Orientation Level: Oriented X4 / Intact Behavior During Session: [facility] for tasks performed Safety/Judgement: Decreased safety judgement for tasks assessed Safety/Judgement - Other Comments: pt moves quickly and decr awareness of left sided weakness Awareness of Errors: Assistance required to identify errors made;Assistance required to correct errors made Awareness of Errors - Other Comments: Pt improving with ambulating in hallway, veered left only once but did not lose balance and self corrected.      Mobility   Bed Mobility Bed Mobility: Sit to Supine Supine to Sit: 5: Supervision;HOB flat Sitting - Scoot to Edge of Bed: 5: Supervision Sit to Supine: Not Tested (comment) Details for Bed Mobility Assistance: Assisted patient in placing LEs on bed.   Transfers Sit to Stand: With upper extremity assist;With armrests;From chair/3-in-1;5: Supervision;From toilet Stand to Sit: With upper extremity assist;5: Supervision;With armrests;To chair/3-in-1;To toilet Details for Transfer Assistance: cues not needed for hand placement or with set up with RW       Balance Static Sitting Balance Static Sitting - Balance Support: No upper extremity supported;Feet supported Static Sitting - Level of Assistance: 7: Independent Dynamic Sitting Balance Dynamic Sitting -  Balance Support: Feet supported;Right upper extremity supported;Left upper extremity supported Dynamic Sitting - Level of Assistance: 5: Stand by assistance Dynamic  Sitting Balance - Compensations: patient able to retrieve items from floor Dynamic Sitting - Balance Activities: Lateral lean/weight shifting;Forward lean/weight shifting;Reaching for objects;Reaching across midline Dynamic Sitting - Comments: LB dressing, clothing management and hygiene after toileting Static Standing Balance Static Standing - Balance Support: Bilateral upper extremity supported;During functional activity Static Standing - Level of Assistance: 5: Stand by assistance Static Standing - Comment/# of Minutes: LB dressing, clothing management and hygiene after toileting. stand at sink to comb hair, wash hands and put on lipstick.   End of Session    GO     [name] 09/28/2012, 12:15 PM

## 2012-10-30 ENCOUNTER — Ambulatory Visit
Admission: RE | Admit: 2012-10-30 | Discharge: 2012-10-30 | Disposition: A | Discharge status: Home / Self Care | Payer: Medicare Other | Source: Ambulatory Visit | Attending: Internal Medicine | Admitting: Internal Medicine

## 2012-10-30 DIAGNOSIS — Z1231 Encounter for screening mammogram for malignant neoplasm of breast: Secondary | ICD-10-CM

## 2013-06-03 ENCOUNTER — Ambulatory Visit: Payer: Self-pay | Admitting: Neurology

## 2013-07-03 ENCOUNTER — Encounter: Payer: Self-pay | Admitting: Neurology

## 2013-07-03 ENCOUNTER — Ambulatory Visit (INDEPENDENT_AMBULATORY_CARE_PROVIDER_SITE_OTHER): Payer: Medicare Other | Admitting: Neurology

## 2013-07-03 VITALS — BP 138/67 | HR 73 | Temp 97.6°F | Ht 61.0 in | Wt 172.0 lb

## 2013-07-03 DIAGNOSIS — I6789 Other cerebrovascular disease: Secondary | ICD-10-CM

## 2013-07-03 NOTE — Progress Notes (Signed)
[facility] [address] [place]. [place] [address] [phone]       OFFICE FOLLOW-UP NOTE  Ms. 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 S Update 07/03/2013 She returns for followup of her last visit on 11/28/12. She continues to do well from a neurovascular standpoint without any recurrent stroke or TIA symptoms since her initial stroke in December 2013. She is tolerating aspirin well without any side effects. She states her blood pressure is quite good and today it is 138/67  In our office. She had her last lipid profile checked 4 months ago and it was fine. She has a multitude of non neurological symptoms today but no new neurological complaints. ROS:   14 system review of systems is positive for  swelling in the legs, fatigue, weight gain, hearing loss, trouble swallowing, shortness of breath, cough, wheezing, joint pain, runny nose, decreased energy, slurred speech and difficulty swallowing.  PMH:  Past Medical History  Diagnosis Date  . Hypertension   . Arthritis   . Gout, joint   . Stroke   . Anemia     Social History:  History   Social History  . Marital Status: Divorced    Spouse Name: N/A    Number of Children: N/A  . Years of Education: N/A   Occupational History  . Not on file.   Social History Main Topics  . Smoking status: Never Smoker   . Smokeless tobacco: Never Used  . Alcohol Use: No  . Drug Use: No  . Sexual Activity: No   Other Topics Concern  . Not on file   Social History Narrative  . No narrative on file    Medications:   Current Outpatient Prescriptions on File Prior to Visit  Medication Sig Dispense Refill  . alendronate (FOSAMAX) 70 MG tablet Take 70 mg by mouth every 7 (seven) days. Take with a full  glass of water on an empty stomach. On Mondays      . aspirin (BAYER ASPIRIN) 325 MG tablet Take 1 tablet (325 mg total) by mouth daily.      . Calcium Carbonate-Vitamin D (CALTRATE 600+D PO) Take 1 tablet by mouth 2 (two) times daily.      Marland Kitchen losartan (COZAAR) 100 MG tablet Take 100 mg by mouth daily.      . metoprolol (LOPRESSOR) 50 MG tablet Take 25 mg by mouth 2 (two) times daily.      Bertram Gala Glycol-Propyl Glycol (SYSTANE OP) Apply 1 drop to eye 2 (two) times daily.      . simvastatin (ZOCOR) 20 MG tablet Take 20 mg by mouth every evening.      . triamcinolone cream (KENALOG) 0.1 % Apply 1 application topically daily as needed. For rash       No current facility-administered medications on file prior to visit.    Allergies:  No Known Allergies  Physical Exam General: well developed, well nourished, seated, in no evident distress Head: head normocephalic and atraumatic. Orohparynx benign Neck: supple with no carotid or supraclavicular bruits Cardiovascular: regular rate and rhythm, no murmurs Musculoskeletal: no deformity Skin:  no rash/petichiae Vascular:  Normal pulses all extremities Filed Vitals:   07/03/13 1451  BP: 138/67  Pulse: 73  Temp: 97.6 F (36.4 C)  Neurologic Exam Mental Status: Awake and fully alert. Oriented to place and time. Recent and remote memory intact. Attention span, concentration and fund of knowledge appropriate. Mood and affect appropriate.  Cranial Nerves: Fundoscopic exam reveals sharp disc margins. Pupils equal, briskly reactive to light. Extraocular movements full without nystagmus. Visual fields full to confrontation. Hearing intact. Facial sensation intact. Face, tongue, palate moves normally and symmetrically.  Motor: Normal bulk and tone. Normal strength in all tested extremity muscles. Diminished fine finger movements on the left. Orbits right over left upper extremity. Sensory.: intact to touch and pinprick and vibratory sensation.   Coordination: Rapid alternating movements normal in all extremities. Finger-to-nose and heel-to-shin performed accurately bilaterally. Gait and Station: Arises from chair without difficulty. Stance is normal. Gait demonstrates normal stride length and balance . Not able to heel, toe and tandem walk without difficulty.  Reflexes: 1+ and symmetric. Toes downgoing.   NIHSS  0 Modified Rankin  1   ASSESSMENT: 79 year old Caucasian lady with remote right subcortical infarct from small vessel disease in December 2013 with vascular risk factors of hyperlipidemia, hypertension, age and sex. She is doing well from a neurovascular standpoint.    PLAN: Continue aspirin for stroke prevention and strict control of hypertension but blood pressure goal below 130/90 and lipids with LDL cholesterol goal below 100 mg percent. Check followup carotid ultrasound. Return for followup in 6 months would Paula Fife, NP  or call earlier if necessary.

## 2013-07-03 NOTE — Patient Instructions (Addendum)
Continue aspirin for stroke prevention and strict control of hypertension but blood pressure goal below 130/90 and lipids with LDL cholesterol goal below 100 mg percent. Check followup carotid ultrasound. Return for followup in 6 months would Larita Fife, NP  or call earlier if necessary.

## 2013-09-27 ENCOUNTER — Other Ambulatory Visit: Payer: Self-pay

## 2013-09-27 DIAGNOSIS — Z1231 Encounter for screening mammogram for malignant neoplasm of breast: Secondary | ICD-10-CM

## 2013-10-28 ENCOUNTER — Telehealth: Payer: Self-pay | Admitting: Neurology

## 2013-10-28 NOTE — Telephone Encounter (Signed)
Patient stated at her last visit with Dr. Pearlean BrownieSethi he wanted her to have Doppler before her next visit and she has not heard back from anyone to set that up. She has a follow up apt with L.Lam 01/10/2014. Please call to advise.

## 2013-10-28 NOTE — Telephone Encounter (Signed)
Please advise 

## 2013-10-29 NOTE — Telephone Encounter (Signed)
Ok to order carotid dopplers

## 2013-10-31 ENCOUNTER — Other Ambulatory Visit: Payer: Self-pay | Admitting: Neurology

## 2013-10-31 ENCOUNTER — Ambulatory Visit: Payer: Medicare Other

## 2013-10-31 DIAGNOSIS — I6350 Cerebral infarction due to unspecified occlusion or stenosis of unspecified cerebral artery: Secondary | ICD-10-CM

## 2013-10-31 NOTE — Telephone Encounter (Signed)
Dr. Pearlean BrownieSethi has ok'd  dopplers.  I will call pt. To schedule. sf

## 2013-10-31 NOTE — Telephone Encounter (Signed)
Please advise. Thanks.  

## 2013-11-05 NOTE — Telephone Encounter (Signed)
Patient is scheduled to have her doppler done on 11/15/13.

## 2013-11-12 ENCOUNTER — Ambulatory Visit
Admission: RE | Admit: 2013-11-12 | Discharge: 2013-11-12 | Disposition: A | Discharge status: Home / Self Care | Payer: Self-pay | Source: Ambulatory Visit

## 2013-11-12 DIAGNOSIS — Z1231 Encounter for screening mammogram for malignant neoplasm of breast: Secondary | ICD-10-CM

## 2013-11-15 ENCOUNTER — Ambulatory Visit (INDEPENDENT_AMBULATORY_CARE_PROVIDER_SITE_OTHER): Payer: Medicare Other

## 2013-11-15 DIAGNOSIS — I6350 Cerebral infarction due to unspecified occlusion or stenosis of unspecified cerebral artery: Secondary | ICD-10-CM

## 2014-01-01 ENCOUNTER — Ambulatory Visit: Payer: Medicare Other | Admitting: Nurse Practitioner

## 2014-01-10 ENCOUNTER — Encounter: Payer: Self-pay | Admitting: Nurse Practitioner

## 2014-01-10 ENCOUNTER — Ambulatory Visit (INDEPENDENT_AMBULATORY_CARE_PROVIDER_SITE_OTHER): Payer: Medicare Other | Admitting: Nurse Practitioner

## 2014-01-10 VITALS — BP 112/59 | HR 65 | Ht 61.0 in | Wt 167.0 lb

## 2014-01-10 DIAGNOSIS — I6789 Other cerebrovascular disease: Secondary | ICD-10-CM

## 2014-01-10 NOTE — Progress Notes (Signed)
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   Update 07/03/2013 (PS): She returns for followup of her last visit on 11/28/12. She continues to do well from a neurovascular standpoint without any recurrent stroke or TIA symptoms since her initial stroke in December 2013. She is tolerating aspirin well without any side effects. She states her blood pressure is quite good and today it is 138/67 In our office. She had her last lipid profile checked 4 months ago and it was fine. She has a multitude of non neurological symptoms today but no new neurological complaints.   Update 01/10/14 (LL): Carotid Ultrasound on 11/15/13 showed no significant stenosis.  BP is well controlled, it is 112/59 in office today.  She has had no new neurovascular symptoms.  Her main deficit from the stroke is weakness in the left leg.  She denies any falls because she walks slow and careful.  She is tolerating aspirin well without any side effects.  ROS:  14 system review of systems is positive for runny nose, trouble swallowing, drooling, cough, shortness of breath, leg swelling, murmur, snoring, joint pain, back pain, walking difficulty, weakness  ALLERGIES: No Known Allergies  HOME MEDICATIONS: Outpatient Prescriptions Prior to Visit  Medication Sig Dispense Refill  . alendronate (FOSAMAX) 70 MG tablet Take 70 mg by mouth every 7 (seven) days. Take with a full glass of water on an empty stomach. On Mondays      . aspirin (BAYER ASPIRIN) 325 MG tablet Take 1 tablet (325 mg total) by mouth daily.      . Calcium Carbonate-Vitamin D (CALTRATE 600+D PO) Take 1 tablet by mouth 2 (two) times daily.      . furosemide (LASIX) 20 MG tablet Take 20 mg by mouth  daily. Take one half tablet daily.      Marland Kitchen. losartan (COZAAR) 100 MG tablet Take 100 mg by mouth daily.      . metoprolol (LOPRESSOR) 50 MG tablet Take 25 mg by mouth 2 (two) times daily.      Bertram Gala. Polyethyl Glycol-Propyl Glycol (SYSTANE OP) Apply 1 drop to eye 2 (two) times daily.      . simvastatin (ZOCOR) 20 MG tablet Take 20 mg by mouth every evening.      . triamcinolone cream (KENALOG) 0.1 % Apply 1 application topically daily as needed. For rash       No facility-administered medications prior to visit.     PHYSICAL EXAM  Filed Vitals:   01/10/14 1602  BP: 112/59  Pulse: 65  Height: 5\' 1"  (1.549 m)  Weight: 167 lb (75.751 kg)   Body mass index is 31.57 kg/(m^2).  Physical Exam  General: well developed, well nourished, seated, in no evident distress  Head: head normocephalic and atraumatic. Orohparynx benign  Neck: supple with no carotid or supraclavicular bruits  Cardiovascular: regular rate and rhythm, no murmurs  Musculoskeletal: no deformity  Skin: no rash/petichiae  Vascular: Normal pulses all extremities   Neurologic Exam  Mental Status: Awake and fully alert. Oriented to place and time. Recent and remote memory intact. Attention span, concentration and fund of knowledge appropriate. Mood and affect appropriate.  Cranial Nerves: Fundoscopic exam reveals sharp disc margins. Pupils equal, briskly reactive to light. Extraocular movements full without  nystagmus. Visual fields full to confrontation. Hearing intact. Facial sensation intact. Face, tongue, palate moves normally and symmetrically.  Motor: Normal bulk and tone. Normal strength in all tested extremity muscles. Diminished fine finger movements on the left. Orbits right over left upper extremity.  Sensory: intact to touch and pinprick and vibratory sensation.  Coordination: Rapid alternating movements normal in all extremities. Finger-to-nose and heel-to-shin performed accurately bilaterally.  Gait and Station: Arises  from chair without difficulty. Stance is normal. Gait demonstrates normal stride length and balance. Not able to heel, toe and tandem walk without difficulty. Favors left leg. Reflexes: 1+ and symmetric.  ASSESSMENT AND PLAN 80 year old Caucasian lady with remote right subcortical infarct from small vessel disease in December 2013 with vascular risk factors of hyperlipidemia, hypertension, age and sex. She is doing well from a neurovascular standpoint.   PLAN:  Continue aspirin for stroke prevention and strict control of hypertension but blood pressure goal below 130/90 and lipids with LDL cholesterol goal below 100 mg percent.   Return for followup in 6 months or call earlier if necessary.   Return in about 6 months (around 07/12/2014).  Ronal Fear, MSN, NP-C 01/10/2014, 4:25 PM [facility] [address] [place], [place] [address] [phone]  Note: This document was prepared with digital dictation and possible smart phrase technology. Any transcriptional errors that result from this process are unintentional.

## 2014-01-10 NOTE — Patient Instructions (Signed)
Continue aspirin for stroke prevention and strict control of hypertension but blood pressure goal below 130/90 and lipids with LDL cholesterol goal below 100 mg percent.   Return for followup in 6 months or call earlier if necessary.

## 2014-03-10 ENCOUNTER — Ambulatory Visit (INDEPENDENT_AMBULATORY_CARE_PROVIDER_SITE_OTHER): Payer: Medicare Other | Admitting: Podiatry

## 2014-03-10 ENCOUNTER — Encounter: Payer: Self-pay | Admitting: Podiatry

## 2014-03-10 VITALS — BP 160/91 | HR 74 | Resp 18

## 2014-03-10 DIAGNOSIS — B351 Tinea unguium: Secondary | ICD-10-CM

## 2014-03-10 DIAGNOSIS — M79609 Pain in unspecified limb: Secondary | ICD-10-CM

## 2014-03-10 DIAGNOSIS — Q828 Other specified congenital malformations of skin: Secondary | ICD-10-CM

## 2014-03-10 NOTE — Progress Notes (Signed)
   Subjective:    Patient ID: [name], female    DOB: [birth year], 80 y.o.   MRN: [id number]  HPI I am here to get my toenails and my calluses trimmed up and I took a fall on Feb 12 2014 and left leg calf and has a stress fracture and several ribs are broken and a compressed fracture in the back area and I had a stroke 09/18/12  The last visit for similar service was 07/01/2013  Review of Systems  Respiratory: Positive for cough.   Musculoskeletal: Positive for back pain.  Neurological: Positive for weakness.  Hematological: Bruises/bleeds easily.       Objective:   Physical Exam  Dermatological: Elongated, incurvated, hypertrophic toenails x10 Nucleated keratoses plantar right noted      Assessment & Plan:   Assessment: Symptomatic onychomycoses x10 Porokeratoses x1  Plan: Nails x10 and keratoses x1 debrided  Reappoint at patient's request

## 2014-03-19 ENCOUNTER — Telehealth (HOSPITAL_COMMUNITY): Payer: Self-pay | Admitting: *Deleted

## 2014-03-27 ENCOUNTER — Other Ambulatory Visit (HOSPITAL_COMMUNITY): Payer: Self-pay | Admitting: Internal Medicine

## 2014-03-27 DIAGNOSIS — R011 Cardiac murmur, unspecified: Secondary | ICD-10-CM

## 2014-04-03 ENCOUNTER — Ambulatory Visit (HOSPITAL_COMMUNITY)
Admission: RE | Admit: 2014-04-03 | Discharge: 2014-04-03 | Disposition: A | Discharge status: Home / Self Care | Payer: Medicare Other | Source: Ambulatory Visit | Attending: Cardiology | Admitting: Cardiology

## 2014-04-03 DIAGNOSIS — R011 Cardiac murmur, unspecified: Secondary | ICD-10-CM | POA: Insufficient documentation

## 2014-04-03 DIAGNOSIS — W19XXXD Unspecified fall, subsequent encounter: Secondary | ICD-10-CM

## 2014-04-03 DIAGNOSIS — I369 Nonrheumatic tricuspid valve disorder, unspecified: Secondary | ICD-10-CM

## 2014-04-03 DIAGNOSIS — Y92009 Unspecified place in unspecified non-institutional (private) residence as the place of occurrence of the external cause: Secondary | ICD-10-CM

## 2014-04-03 NOTE — Progress Notes (Addendum)
2D Echocardiogram Complete.  04/03/2014   Farrel ConnersBethany McMahill, RDCS  Preliminary Technician Findings:  Pulmonary Artery enlargement with bidirectional flow pattern and increased PA velocities.  Also findings of moderate-severe Tricuspid Valve regurgitation noted.  Reviewed these findings with Dr. T[place] Am[place]illa LionsHochrien and Dr. Rennis GoldenHilty prior to patient leaving.

## 2014-07-14 ENCOUNTER — Encounter: Payer: Self-pay | Admitting: Nurse Practitioner

## 2014-07-14 ENCOUNTER — Ambulatory Visit (INDEPENDENT_AMBULATORY_CARE_PROVIDER_SITE_OTHER): Payer: Medicare Other | Admitting: Nurse Practitioner

## 2014-07-14 VITALS — BP 112/62 | HR 71 | Temp 97.9°F | Ht 60.0 in | Wt 142.0 lb

## 2014-07-14 DIAGNOSIS — I6789 Other cerebrovascular disease: Secondary | ICD-10-CM

## 2014-07-14 DIAGNOSIS — I69392 Facial weakness following cerebral infarction: Secondary | ICD-10-CM

## 2014-07-14 DIAGNOSIS — IMO0002 Reserved for concepts with insufficient information to code with codable children: Secondary | ICD-10-CM

## 2014-07-14 NOTE — Progress Notes (Signed)
PATIENT: [name] DOB: 04-19-1923  REASON FOR VISIT: routine follow up for stroke HISTORY FROM: patient, daughter  HISTORY OF PRESENT ILLNESS: 80 year old Caucasian lady with remote right subcortical infarct from small vessel disease in December 2013 with vascular risk factors of hyperlipidemia, hypertension, age and sex.   She returns for followup of her last visit on 01/10/14 with me. She continues to do well from a neurovascular standpoint without any recurrent stroke or TIA symptoms since her initial stroke in December 2013. She is tolerating aspirin well without any side effects. She states her blood pressure is quite good and today it is 112/62 in our office. She has her annual physical scheduled in December at Dr. Benard Halsted office and plans to have labwork then. Carotid Ultrasound on 11/15/13 showed no significant stenosis.  She has had no new neurovascular symptoms. Her main deficit from the stroke is weakness in the left leg. She a fall in May and suffered a compression fracture in her spine, a stress fracture in her leg and cracked ribs, which are healing.  Her daughter states she tripped and fell off of the front porch. She is using a walker most of the time. Her main complaint is back pain, which she takes aleve for.  ROS:  14 system review of systems is positive for light sensitivity, cold intolerance, joint pain, walking difficulty, rash, numbness, incontinence of bladder and frequency of urination.  ALLERGIES: No Known Allergies  HOME MEDICATIONS: Outpatient Prescriptions Prior to Visit  Medication Sig Dispense Refill  . alendronate (FOSAMAX) 70 MG tablet Take 70 mg by mouth every 7 (seven) days. Take with a full glass of water on an empty stomach. On Mondays      . aspirin (BAYER ASPIRIN) 325 MG tablet Take 1 tablet (325 mg total) by mouth daily.      . Calcium Carbonate-Vitamin D (CALTRATE 600+D PO) Take 1 tablet by mouth 2 (two) times daily.      . fluticasone  (FLONASE) 50 MCG/ACT nasal spray as needed.      . furosemide (LASIX) 20 MG tablet Take 20 mg by mouth daily. Take one half tablet daily.      Marland Kitchen HYDROcodone-acetaminophen (NORCO/VICODIN) 5-325 MG per tablet       . losartan (COZAAR) 100 MG tablet Take 100 mg by mouth daily.      . metoprolol (LOPRESSOR) 50 MG tablet Take 25 mg by mouth 2 (two) times daily.      Bertram Gala Glycol-Propyl Glycol (SYSTANE OP) Apply 1 drop to eye 2 (two) times daily.      . simvastatin (ZOCOR) 20 MG tablet Take 20 mg by mouth every evening.      . triamcinolone cream (KENALOG) 0.1 % Apply 1 application topically daily as needed. For rash       . Naproxen Sodium (ALEVE PO)    Sig: Take by mouth daily as needed.  Marland Kitchen DISCONTD: VITAMIN D, CHOLECALCIFEROL, PO    Sig: Take by mouth. Women's vitamin w/d  . Multiple Vitamins-Minerals (MULTIPLE VITAMINS/WOMENS PO)    Sig: Take by mouth. W/vitamin D   PHYSICAL EXAM Filed Vitals:   07/14/14 1457  BP: 112/62  Pulse: 71  Temp: 97.9 F (36.6 C)  TempSrc: Oral  Height: 5' (1.524 m)  Weight: 142 lb (64.411 kg)   Body mass index is 27.73 kg/(m^2).  Physical Exam  General: well developed, well nourished, elderly Caucasian female, seated, in no evident distress  Head: head normocephalic and atraumatic.  Orohparynx benign  Neck: supple with no carotid or supraclavicular bruits  Cardiovascular: regular rate and rhythm, 3/6 systolic murmur Musculoskeletal: no deformity  Skin: no rash/petichiae  Vascular: Normal pulses all extremities   Neurologic Exam  Mental Status: Awake and fully alert. Oriented to place and time. Recent and remote memory intact. Attention span, concentration and fund of knowledge appropriate. Mood and affect appropriate.  Cranial Nerves: Fundoscopic exam reveals sharp disc margins. Pupils equal, briskly reactive to light. Extraocular movements full without nystagmus. Visual fields full to confrontation. Hearing intact. Facial sensation intact. Face,  tongue, palate moves normally and symmetrically.  Motor: Normal bulk and tone. Normal strength in all tested extremity muscles. Diminished fine finger movements on the left. Orbits right over left upper extremity.  Sensory: intact to touch and pinprick and vibratory sensation.  Coordination: Rapid alternating movements normal in all extremities. Finger-to-nose and heel-to-shin performed accurately bilaterally.  Gait and Station: Arises from chair without difficulty. Stance is normal. Gait demonstrates normal stride length and balance. Not able to heel, toe and tandem walk without difficulty. Favors left leg.  Reflexes: 1+ and symmetric.   ASSESSMENT: 80 year old Caucasian lady with remote right subcortical infarct from small vessel disease in December 2013 with vascular risk factors of hyperlipidemia, hypertension, age and sex. She is doing well from a neurovascular standpoint.   PLAN: Reviewed stroke risk factors and prevention.  Continue aspirin for stroke prevention and strict control of hypertension but blood pressure goal below 130/90 and lipids with LDL cholesterol goal below 100 mg percent.  Return for followup in 1 year with Dr. Pearlean BrownieSethi or call earlier if necessary.   Tawny AsalLYNN E. Nyara Capell, MSN, FNP-BC, A/GNP-C 07/14/2014, 3:02 PM [facility] [address], [address] [place], [place] [address] [phone]  Note: This document was prepared with digital dictation and possible smart phrase technology. Any transcriptional errors that result from this process are unintentional.

## 2014-07-14 NOTE — Progress Notes (Signed)
I agree with the above plan 

## 2014-07-14 NOTE — Patient Instructions (Addendum)
Continue aspirin for stroke prevention and strict control of hypertension but blood pressure goal below 130/90 and lipids with LDL cholesterol goal below 100 mg percent.  Return for followup in 1 year or call earlier if necessary.   Stroke Prevention Some medical conditions and behaviors are associated with an increased chance of having a stroke. You may prevent a stroke by making healthy choices and managing medical conditions. HOW CAN I REDUCE MY RISK OF HAVING A STROKE?   Stay physically active. Get at least 30 minutes of activity on most or all days.  Do not smoke. It may also be helpful to avoid exposure to secondhand smoke.  Limit alcohol use. Moderate alcohol use is considered to be:  No more than 2 drinks per day for men.  No more than 1 drink per day for nonpregnant women.  Eat healthy foods. This involves:  Eating 5 or more servings of fruits and vegetables a day.  Making dietary changes that address high blood pressure (hypertension), high cholesterol, diabetes, or obesity.  Manage your cholesterol levels.  Making food choices that are high in fiber and low in saturated fat, trans fat, and cholesterol may control cholesterol levels.  Take any prescribed medicines to control cholesterol as directed by your health care provider.  Manage your diabetes.  Controlling your carbohydrate and sugar intake is recommended to manage diabetes.  Take any prescribed medicines to control diabetes as directed by your health care provider.  Control your hypertension.  Making food choices that are low in salt (sodium), saturated fat, trans fat, and cholesterol is recommended to manage hypertension.  Take any prescribed medicines to control hypertension as directed by your health care provider.  Maintain a healthy weight.  Reducing calorie intake and making food choices that are low in sodium, saturated fat, trans fat, and cholesterol are recommended to manage weight.  Stop drug  abuse.  Avoid taking birth control pills.  Talk to your health care provider about the risks of taking birth control pills if you are over 78 years old, smoke, get migraines, or have ever had a blood clot.  Get evaluated for sleep disorders (sleep apnea).  Talk to your health care provider about getting a sleep evaluation if you snore a lot or have excessive sleepiness.  Take medicines only as directed by your health care provider.  For some people, aspirin or blood thinners (anticoagulants) are helpful in reducing the risk of forming abnormal blood clots that can lead to stroke. If you have the irregular heart rhythm of atrial fibrillation, you should be on a blood thinner unless there is a good reason you cannot take them.  Understand all your medicine instructions.  Make sure that other conditions (such as anemia or atherosclerosis) are addressed. SEEK IMMEDIATE MEDICAL CARE IF:   You have sudden weakness or numbness of the face, arm, or leg, especially on one side of the body.  Your face or eyelid droops to one side.  You have sudden confusion.  You have trouble speaking (aphasia) or understanding.  You have sudden trouble seeing in one or both eyes.  You have sudden trouble walking.  You have dizziness.  You have a loss of balance or coordination.  You have a sudden, severe headache with no known cause.  You have new chest pain or an irregular heartbeat. Any of these symptoms may represent a serious problem that is an emergency. Do not wait to see if the symptoms will go away. Get medical help at  once. Call your local emergency services (911 in [place] Do not drive yourself to the hospital. Document Released: 11/03/2004 Document Revised: 02/10/2014 Document Reviewed: 03/29/2013 [facility] Patient Information 2015 [place], [place]. This information is not intended to replace advice given to you by your health care provider. Make sure you discuss any questions you have with  your health care provider.

## 2014-10-15 ENCOUNTER — Other Ambulatory Visit: Payer: Self-pay

## 2014-10-15 DIAGNOSIS — Z1231 Encounter for screening mammogram for malignant neoplasm of breast: Secondary | ICD-10-CM

## 2014-10-20 DIAGNOSIS — D81818 Other biotin-dependent carboxylase deficiency: Secondary | ICD-10-CM | POA: Diagnosis not present

## 2014-10-22 DIAGNOSIS — Z961 Presence of intraocular lens: Secondary | ICD-10-CM | POA: Diagnosis not present

## 2014-10-22 DIAGNOSIS — H02834 Dermatochalasis of left upper eyelid: Secondary | ICD-10-CM | POA: Diagnosis not present

## 2014-10-22 DIAGNOSIS — H40013 Open angle with borderline findings, low risk, bilateral: Secondary | ICD-10-CM | POA: Diagnosis not present

## 2014-10-22 DIAGNOSIS — H02831 Dermatochalasis of right upper eyelid: Secondary | ICD-10-CM | POA: Diagnosis not present

## 2014-10-22 DIAGNOSIS — H04123 Dry eye syndrome of bilateral lacrimal glands: Secondary | ICD-10-CM | POA: Diagnosis not present

## 2014-11-14 ENCOUNTER — Ambulatory Visit
Admission: RE | Admit: 2014-11-14 | Discharge: 2014-11-14 | Disposition: A | Discharge status: Home / Self Care | Payer: Medicare Other | Source: Ambulatory Visit

## 2014-11-14 DIAGNOSIS — Z1231 Encounter for screening mammogram for malignant neoplasm of breast: Secondary | ICD-10-CM | POA: Diagnosis not present

## 2014-11-17 ENCOUNTER — Other Ambulatory Visit: Payer: Self-pay | Admitting: Internal Medicine

## 2014-11-17 DIAGNOSIS — R928 Other abnormal and inconclusive findings on diagnostic imaging of breast: Secondary | ICD-10-CM

## 2014-11-25 ENCOUNTER — Ambulatory Visit
Admission: RE | Admit: 2014-11-25 | Discharge: 2014-11-25 | Disposition: A | Discharge status: Home / Self Care | Payer: Medicare Other | Source: Ambulatory Visit | Attending: Internal Medicine | Admitting: Internal Medicine

## 2014-11-25 DIAGNOSIS — R928 Other abnormal and inconclusive findings on diagnostic imaging of breast: Secondary | ICD-10-CM

## 2014-11-25 DIAGNOSIS — N6011 Diffuse cystic mastopathy of right breast: Secondary | ICD-10-CM | POA: Diagnosis not present

## 2014-11-28 DIAGNOSIS — D81818 Other biotin-dependent carboxylase deficiency: Secondary | ICD-10-CM | POA: Diagnosis not present

## 2014-12-29 DIAGNOSIS — D81818 Other biotin-dependent carboxylase deficiency: Secondary | ICD-10-CM | POA: Diagnosis not present

## 2015-01-13 DIAGNOSIS — H00026 Hordeolum internum left eye, unspecified eyelid: Secondary | ICD-10-CM | POA: Diagnosis not present

## 2015-01-21 ENCOUNTER — Encounter: Payer: Self-pay | Admitting: Podiatry

## 2015-01-21 ENCOUNTER — Ambulatory Visit (INDEPENDENT_AMBULATORY_CARE_PROVIDER_SITE_OTHER): Payer: Medicare Other | Admitting: Podiatry

## 2015-01-21 DIAGNOSIS — B351 Tinea unguium: Secondary | ICD-10-CM

## 2015-01-21 DIAGNOSIS — Q828 Other specified congenital malformations of skin: Secondary | ICD-10-CM | POA: Diagnosis not present

## 2015-01-21 DIAGNOSIS — M79676 Pain in unspecified toe(s): Secondary | ICD-10-CM | POA: Diagnosis not present

## 2015-01-21 NOTE — Progress Notes (Signed)
Patient ID: [name], female   DOB: 03/08/1923, 81 y.o.   MRN: [id number]   Subjective: Patient presents today complaining of painful toenails and plantar keratoses right  Objective: The toenails are elongated, hypertrophic, incurvated, discolored and tender to palpation 6-10 Nucleated plantar keratoses sub-second MPJ right   Assessment: Symptomatic onychomycoses 6-10 Porokeratosis 1  Plan: Debridement toenails 10 and keratoses 1 without any bleeding  Reappoint at patient's request

## 2015-01-30 DIAGNOSIS — E538 Deficiency of other specified B group vitamins: Secondary | ICD-10-CM | POA: Diagnosis not present

## 2015-03-02 DIAGNOSIS — D81818 Other biotin-dependent carboxylase deficiency: Secondary | ICD-10-CM | POA: Diagnosis not present

## 2015-03-05 DIAGNOSIS — L720 Epidermal cyst: Secondary | ICD-10-CM | POA: Diagnosis not present

## 2015-03-05 DIAGNOSIS — R918 Other nonspecific abnormal finding of lung field: Secondary | ICD-10-CM | POA: Diagnosis not present

## 2015-03-05 DIAGNOSIS — R938 Abnormal findings on diagnostic imaging of other specified body structures: Secondary | ICD-10-CM | POA: Diagnosis not present

## 2015-03-06 DIAGNOSIS — L02439 Carbuncle of limb, unspecified: Secondary | ICD-10-CM | POA: Diagnosis not present

## 2015-03-26 DIAGNOSIS — M109 Gout, unspecified: Secondary | ICD-10-CM | POA: Diagnosis not present

## 2015-03-26 DIAGNOSIS — M810 Age-related osteoporosis without current pathological fracture: Secondary | ICD-10-CM | POA: Diagnosis not present

## 2015-03-26 DIAGNOSIS — N390 Urinary tract infection, site not specified: Secondary | ICD-10-CM | POA: Diagnosis not present

## 2015-03-26 DIAGNOSIS — E1121 Type 2 diabetes mellitus with diabetic nephropathy: Secondary | ICD-10-CM | POA: Diagnosis not present

## 2015-03-26 DIAGNOSIS — Z1389 Encounter for screening for other disorder: Secondary | ICD-10-CM | POA: Diagnosis not present

## 2015-03-26 DIAGNOSIS — Z0000 Encounter for general adult medical examination without abnormal findings: Secondary | ICD-10-CM | POA: Diagnosis not present

## 2015-03-26 DIAGNOSIS — D81818 Other biotin-dependent carboxylase deficiency: Secondary | ICD-10-CM | POA: Diagnosis not present

## 2015-03-26 DIAGNOSIS — Z23 Encounter for immunization: Secondary | ICD-10-CM | POA: Diagnosis not present

## 2015-03-26 DIAGNOSIS — I10 Essential (primary) hypertension: Secondary | ICD-10-CM | POA: Diagnosis not present

## 2015-03-31 ENCOUNTER — Other Ambulatory Visit: Payer: Self-pay | Admitting: Internal Medicine

## 2015-03-31 DIAGNOSIS — R911 Solitary pulmonary nodule: Secondary | ICD-10-CM

## 2015-04-02 DIAGNOSIS — E1122 Type 2 diabetes mellitus with diabetic chronic kidney disease: Secondary | ICD-10-CM | POA: Diagnosis not present

## 2015-04-02 DIAGNOSIS — I129 Hypertensive chronic kidney disease with stage 1 through stage 4 chronic kidney disease, or unspecified chronic kidney disease: Secondary | ICD-10-CM | POA: Diagnosis not present

## 2015-04-02 DIAGNOSIS — I69354 Hemiplegia and hemiparesis following cerebral infarction affecting left non-dominant side: Secondary | ICD-10-CM | POA: Diagnosis not present

## 2015-04-02 DIAGNOSIS — E538 Deficiency of other specified B group vitamins: Secondary | ICD-10-CM | POA: Diagnosis not present

## 2015-04-02 DIAGNOSIS — N179 Acute kidney failure, unspecified: Secondary | ICD-10-CM | POA: Diagnosis not present

## 2015-04-02 DIAGNOSIS — L821 Other seborrheic keratosis: Secondary | ICD-10-CM | POA: Diagnosis not present

## 2015-04-06 ENCOUNTER — Other Ambulatory Visit: Payer: Self-pay

## 2015-04-06 ENCOUNTER — Other Ambulatory Visit: Payer: Medicare Other

## 2015-04-07 ENCOUNTER — Ambulatory Visit
Admission: RE | Admit: 2015-04-07 | Discharge: 2015-04-07 | Disposition: A | Discharge status: Home / Self Care | Payer: Medicare Other | Source: Ambulatory Visit | Attending: Internal Medicine | Admitting: Internal Medicine

## 2015-04-07 DIAGNOSIS — I517 Cardiomegaly: Secondary | ICD-10-CM | POA: Diagnosis not present

## 2015-04-07 DIAGNOSIS — I2510 Atherosclerotic heart disease of native coronary artery without angina pectoris: Secondary | ICD-10-CM | POA: Diagnosis not present

## 2015-04-07 DIAGNOSIS — R911 Solitary pulmonary nodule: Secondary | ICD-10-CM

## 2015-04-07 DIAGNOSIS — R05 Cough: Secondary | ICD-10-CM | POA: Diagnosis not present

## 2015-04-07 DIAGNOSIS — E041 Nontoxic single thyroid nodule: Secondary | ICD-10-CM | POA: Diagnosis not present

## 2015-04-16 DIAGNOSIS — M1711 Unilateral primary osteoarthritis, right knee: Secondary | ICD-10-CM | POA: Diagnosis not present

## 2015-04-21 DIAGNOSIS — N179 Acute kidney failure, unspecified: Secondary | ICD-10-CM | POA: Diagnosis not present

## 2015-05-04 DIAGNOSIS — I129 Hypertensive chronic kidney disease with stage 1 through stage 4 chronic kidney disease, or unspecified chronic kidney disease: Secondary | ICD-10-CM | POA: Diagnosis not present

## 2015-05-04 DIAGNOSIS — E538 Deficiency of other specified B group vitamins: Secondary | ICD-10-CM | POA: Diagnosis not present

## 2015-05-29 DIAGNOSIS — M1711 Unilateral primary osteoarthritis, right knee: Secondary | ICD-10-CM | POA: Diagnosis not present

## 2015-06-01 DIAGNOSIS — L02414 Cutaneous abscess of left upper limb: Secondary | ICD-10-CM | POA: Diagnosis not present

## 2015-06-05 DIAGNOSIS — E538 Deficiency of other specified B group vitamins: Secondary | ICD-10-CM | POA: Diagnosis not present

## 2015-06-08 DIAGNOSIS — M1711 Unilateral primary osteoarthritis, right knee: Secondary | ICD-10-CM | POA: Diagnosis not present

## 2015-06-17 DIAGNOSIS — M1711 Unilateral primary osteoarthritis, right knee: Secondary | ICD-10-CM | POA: Diagnosis not present

## 2015-06-24 DIAGNOSIS — M109 Gout, unspecified: Secondary | ICD-10-CM | POA: Diagnosis not present

## 2015-06-24 DIAGNOSIS — M1990 Unspecified osteoarthritis, unspecified site: Secondary | ICD-10-CM | POA: Diagnosis not present

## 2015-06-24 DIAGNOSIS — M25511 Pain in right shoulder: Secondary | ICD-10-CM | POA: Diagnosis not present

## 2015-06-24 DIAGNOSIS — M549 Dorsalgia, unspecified: Secondary | ICD-10-CM | POA: Diagnosis not present

## 2015-06-26 DIAGNOSIS — I270 Primary pulmonary hypertension: Secondary | ICD-10-CM | POA: Diagnosis not present

## 2015-06-26 DIAGNOSIS — E538 Deficiency of other specified B group vitamins: Secondary | ICD-10-CM | POA: Diagnosis not present

## 2015-06-26 DIAGNOSIS — E1122 Type 2 diabetes mellitus with diabetic chronic kidney disease: Secondary | ICD-10-CM | POA: Diagnosis not present

## 2015-06-26 DIAGNOSIS — I129 Hypertensive chronic kidney disease with stage 1 through stage 4 chronic kidney disease, or unspecified chronic kidney disease: Secondary | ICD-10-CM | POA: Diagnosis not present

## 2015-06-26 DIAGNOSIS — N183 Chronic kidney disease, stage 3 (moderate): Secondary | ICD-10-CM | POA: Diagnosis not present

## 2015-06-26 DIAGNOSIS — I5032 Chronic diastolic (congestive) heart failure: Secondary | ICD-10-CM | POA: Diagnosis not present

## 2015-06-26 DIAGNOSIS — E785 Hyperlipidemia, unspecified: Secondary | ICD-10-CM | POA: Diagnosis not present

## 2015-07-07 DIAGNOSIS — E538 Deficiency of other specified B group vitamins: Secondary | ICD-10-CM | POA: Diagnosis not present

## 2015-07-07 DIAGNOSIS — Z23 Encounter for immunization: Secondary | ICD-10-CM | POA: Diagnosis not present

## 2015-07-13 DIAGNOSIS — M1990 Unspecified osteoarthritis, unspecified site: Secondary | ICD-10-CM | POA: Diagnosis not present

## 2015-07-13 DIAGNOSIS — M5136 Other intervertebral disc degeneration, lumbar region: Secondary | ICD-10-CM | POA: Diagnosis not present

## 2015-07-13 DIAGNOSIS — M109 Gout, unspecified: Secondary | ICD-10-CM | POA: Diagnosis not present

## 2015-07-13 DIAGNOSIS — L0390 Cellulitis, unspecified: Secondary | ICD-10-CM | POA: Diagnosis not present

## 2015-07-14 DIAGNOSIS — L03113 Cellulitis of right upper limb: Secondary | ICD-10-CM | POA: Diagnosis not present

## 2015-07-15 ENCOUNTER — Encounter: Payer: Self-pay | Admitting: Nurse Practitioner

## 2015-07-15 ENCOUNTER — Ambulatory Visit (INDEPENDENT_AMBULATORY_CARE_PROVIDER_SITE_OTHER): Payer: Medicare Other | Admitting: Nurse Practitioner

## 2015-07-15 VITALS — BP 126/70 | HR 68 | Ht 62.0 in | Wt 161.8 lb

## 2015-07-15 DIAGNOSIS — I6789 Other cerebrovascular disease: Secondary | ICD-10-CM | POA: Diagnosis not present

## 2015-07-15 DIAGNOSIS — Z8673 Personal history of transient ischemic attack (TIA), and cerebral infarction without residual deficits: Secondary | ICD-10-CM | POA: Insufficient documentation

## 2015-07-15 NOTE — Patient Instructions (Addendum)
Keep systolic blood pressure less than 130, today's reading 126/70 Lipids are followed by PCP keep LDL less than 100 Carotid Doppler to be repeated will schedule  Continue ASA 325 for  secondary stroke prevention No further stroke or TIA symptoms since 2013 If recurrent stroke symptoms occur, call 911 and proceed to the hospital Discharge from neurologic services at this time

## 2015-07-15 NOTE — Progress Notes (Signed)
[facility]  PATIENT: [name] DOB: 1923-09-25   REASON FOR VISIT: Follow-up for history of stroke with risk factors of hypertension and hyperlipidemia HISTORY FROM: Patient and daughter    HISTORY OF PRESENT ILLNESS: [name], 81 year old female returns for follow-up. 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 She is currently on aspirin 325 daily and has not had further stroke or TIA symptoms since December 2013. Her lipids are followed by Dr. Thea Silversmith. Her last carotid ultrasound on 11/15/2013 was negative for significant stenosis. She had one fall in May without injury. She is using a single-point cane to ambulate. She continues to live in her own home and fairly independent with activities of daily living. She continues to drive without difficulty. She denies any memory issues. She returns for reevaluation   HISTORY:81 year old Caucasian lady with remote right subcortical infarct from small vessel disease in December 2013 with vascular risk factors of hyperlipidemia, hypertension, age and sex.   She returns for followup of her last visit on 01/10/14 with me. She continues to do well from a neurovascular standpoint without any recurrent stroke or TIA symptoms since her initial stroke in December 2013. She is tolerating aspirin well without any side effects. She states her blood pressure is quite good and today it is 112/62 in our office. She has her annual physical scheduled in December at Dr. Benard Halsted office and plans to have labwork then. Carotid Ultrasound on 11/15/13 showed no significant stenosis. She has had no new neurovascular symptoms. Her main deficit from the stroke is weakness in the left leg. She a fall in May and suffered a compression fracture in her spine, a stress fracture in her leg and cracked ribs, which are healing. Her daughter states she  tripped and fell off of the front porch. She is using a walker most of the time. Her main complaint is back pain, which she takes aleve for.    REVIEW OF SYSTEMS: Full 14 system review of systems performed and notable only for those listed, all others are neg:  Constitutional: neg  Cardiovascular: neg Ear/Nose/Throat: Hearing loss  Skin: neg Eyes: neg Respiratory: Cough Gastroitestinal: neg  Hematology/Lymphatic: neg  Endocrine: neg Musculoskeletal: Walking difficulty Allergy/Immunology: Frequent infections Neurological: neg Psychiatric: neg Sleep : neg   ALLERGIES: No Known Allergies  HOME MEDICATIONS: Outpatient Prescriptions Prior to Visit  Medication Sig Dispense Refill  . aspirin (BAYER ASPIRIN) 325 MG tablet Take 1 tablet (325 mg total) by mouth daily.    . Calcium Carbonate-Vitamin D (CALTRATE 600+D PO) Take 1 tablet by mouth 2 (two) times daily.    . fluticasone (FLONASE) 50 MCG/ACT nasal spray as needed.    . furosemide (LASIX) 20 MG tablet Take 20 mg by mouth daily. Take one half tablet daily.    Marland Kitchen HYDROcodone-acetaminophen (NORCO/VICODIN) 5-325 MG per tablet     . metoprolol (LOPRESSOR) 50 MG tablet Take 25 mg by mouth 2 (two) times daily.    . Multiple Vitamins-Minerals (MULTIPLE VITAMINS/WOMENS PO) Take by mouth. W/vitamin D    . Naproxen Sodium (ALEVE PO) Take by mouth daily as needed.    Bertram Gala Glycol-Propyl Glycol (SYSTANE OP) Apply 1 drop to eye 2 (two) times daily.    . simvastatin (ZOCOR) 20 MG tablet Take 20 mg by mouth every evening.    . triamcinolone cream (KENALOG) 0.1 % Apply 1 application topically daily as needed.  For rash    . alendronate (FOSAMAX) 70 MG tablet Take 70 mg by mouth every 7 (seven) days. Take with a full glass of water on an empty stomach. On Mondays    . losartan (COZAAR) 100 MG tablet Take 100 mg by mouth daily.     No facility-administered medications prior to visit.    PAST MEDICAL HISTORY: Past Medical History    Diagnosis Date  . Hypertension   . Arthritis   . Gout, joint   . Stroke (HCC)   . Anemia     PAST SURGICAL HISTORY: Past Surgical History  Procedure Laterality Date  . Joint replacement  left knee  . Eye surgery  both  . Dilation and curettage of uterus    . Abdominal hysterectomy    . Hernia repair    . Breast surgery      CYST    FAMILY HISTORY: History reviewed. No pertinent family history.  SOCIAL HISTORY: Social History   Social History  . Marital Status: Divorced    Spouse Name: N/A  . Number of Children: 3  . Years of Education: 12   Occupational History  .      retired   Social History Main Topics  . Smoking status: Never Smoker   . Smokeless tobacco: Never Used  . Alcohol Use: No  . Drug Use: No  . Sexual Activity: No   Other Topics Concern  . Not on file   Social History Narrative   Patient is single and lives at home alone.   Retired   Education 12th grade    Right handed   Caffeine  sometimes     PHYSICAL EXAM  Filed Vitals:   07/15/15 0953  BP: 126/70  Pulse: 68  Height:  (1.575 m)  Weight: 161 lb 12.8 oz (73.392 kg)   Body mass index is 29.59 kg/(m^2). General: well developed, well nourished, elderly Caucasian female, seated, in no evident distress  Head: head normocephalic and atraumatic. Orohparynx benign  Neck: supple with no carotid or supraclavicular bruits  Cardiovascular: regular rate and rhythm, 3/6 systolic murmur Musculoskeletal: no deformity  Skin: no rash/petichiae  Vascular: Normal pulses all extremities   Neurologic Exam  Mental Status: Awake and fully alert. Oriented to place and time. Recent and remote memory intact. Attention span, concentration and fund of knowledge appropriate. Mood and affect appropriate.  Cranial Nerves: Fundoscopic exam deferred. Pupils equal, briskly reactive to light. Extraocular movements full without nystagmus. Visual fields full to confrontation. Decreased hearing..  Facial sensation intact. Face, tongue, palate moves normally and symmetrically.  Motor: Normal bulk and tone. Normal strength in all tested extremity muscles. Diminished fine finger movements on the left. Orbits right over left upper extremity.  Sensory: intact to touch and pinprick and vibratory sensation.  Coordination: Rapid alternating movements normal in all extremities. Finger-to-nose and heel-to-shin performed accurately bilaterally.  Gait and Station: Arises from chair without difficulty. Stance is wide based. Gait demonstrates normal stride length and balance. Not able to heel, toe and tandem walk without difficulty. Favors left leg. Ambulates with a walker Reflexes: 1+ and symmetric.   DIAGNOSTIC DATA (LABS, IMAGING, TESTING) -  ASSESSMENT AND PLAN  81 y.o. year old Caucasian lady with  right subcortical infarct from small vessel disease in December 2013 with vascular risk factors of hyperlipidemia, hypertension, age and sex. She is doing well from a neurovascular standpoint.  The patient is a current patient of Dr. Pearlean Brownie who is out of the  office today . This note is sent to the work in doctor.     PLAN:Management of risk factors is key to prevent further stroke.  Keep systolic blood pressure less than 130, today's reading 126/70 Lipids are followed by PCP keep LDL less than 100 Carotid Doppler to be repeated will schedule  Continue ASA 325 for  secondary stroke prevention No further stroke or TIA symptoms since 2013 If recurrent stroke symptoms occur, call 911 and proceed to the hospital Discharge from neurologic services at this time Nilda Riggs, [facility], [facility], APRN  [facility] [address] [place], [place] [address] [phone]

## 2015-07-15 NOTE — Progress Notes (Signed)
I reviewed above note and agree with the assessment and plan.  Marvel Plan, MD PhD Stroke Neurology 07/15/2015 6:52 PM

## 2015-07-16 NOTE — Progress Notes (Signed)
I agree with the above plan 

## 2015-07-20 ENCOUNTER — Ambulatory Visit: Payer: Medicare Other | Admitting: Neurology

## 2015-08-05 ENCOUNTER — Ambulatory Visit (INDEPENDENT_AMBULATORY_CARE_PROVIDER_SITE_OTHER): Payer: Medicare Other

## 2015-08-05 DIAGNOSIS — I6789 Other cerebrovascular disease: Secondary | ICD-10-CM

## 2015-08-05 DIAGNOSIS — Z8673 Personal history of transient ischemic attack (TIA), and cerebral infarction without residual deficits: Secondary | ICD-10-CM

## 2015-08-07 DIAGNOSIS — M1711 Unilateral primary osteoarthritis, right knee: Secondary | ICD-10-CM | POA: Diagnosis not present

## 2015-08-07 DIAGNOSIS — E538 Deficiency of other specified B group vitamins: Secondary | ICD-10-CM | POA: Diagnosis not present

## 2015-08-10 ENCOUNTER — Telehealth: Payer: Self-pay | Admitting: *Deleted

## 2015-08-10 NOTE — Telephone Encounter (Signed)
I spoke to pt and relayed the results of hre carotid doppler (unremarkable).  She verbalized understanding.

## 2015-08-10 NOTE — Telephone Encounter (Signed)
-----   Message from Nilda RiggsNancy Carolyn Martin, NP sent at 08/10/2015  6:39 AM EDT ----- Carotid doppler unremarkable please call the patient

## 2015-08-11 DIAGNOSIS — I214 Non-ST elevation (NSTEMI) myocardial infarction: Secondary | ICD-10-CM

## 2015-08-11 HISTORY — DX: Non-ST elevation (NSTEMI) myocardial infarction: I21.4

## 2015-08-18 ENCOUNTER — Inpatient Hospital Stay (HOSPITAL_COMMUNITY)
Admission: EM | Admit: 2015-08-18 | Discharge: 2015-08-20 | DRG: 246 | Disposition: A | Discharge status: Home Health | Payer: Medicare Other | Attending: Internal Medicine | Admitting: Internal Medicine

## 2015-08-18 ENCOUNTER — Emergency Department (HOSPITAL_COMMUNITY): Payer: Medicare Other

## 2015-08-18 ENCOUNTER — Encounter (HOSPITAL_COMMUNITY): Payer: Self-pay | Admitting: Emergency Medicine

## 2015-08-18 ENCOUNTER — Encounter (HOSPITAL_COMMUNITY)
Admission: EM | Disposition: A | Discharge status: Home Health | Payer: Self-pay | Source: Home / Self Care | Attending: Internal Medicine

## 2015-08-18 DIAGNOSIS — Z79899 Other long term (current) drug therapy: Secondary | ICD-10-CM | POA: Diagnosis not present

## 2015-08-18 DIAGNOSIS — I10 Essential (primary) hypertension: Secondary | ICD-10-CM | POA: Diagnosis present

## 2015-08-18 DIAGNOSIS — R471 Dysarthria and anarthria: Secondary | ICD-10-CM | POA: Diagnosis not present

## 2015-08-18 DIAGNOSIS — I214 Non-ST elevation (NSTEMI) myocardial infarction: Principal | ICD-10-CM | POA: Diagnosis present

## 2015-08-18 DIAGNOSIS — I639 Cerebral infarction, unspecified: Secondary | ICD-10-CM | POA: Diagnosis not present

## 2015-08-18 DIAGNOSIS — M810 Age-related osteoporosis without current pathological fracture: Secondary | ICD-10-CM | POA: Diagnosis present

## 2015-08-18 DIAGNOSIS — G459 Transient cerebral ischemic attack, unspecified: Secondary | ICD-10-CM | POA: Diagnosis not present

## 2015-08-18 DIAGNOSIS — I2584 Coronary atherosclerosis due to calcified coronary lesion: Secondary | ICD-10-CM | POA: Diagnosis present

## 2015-08-18 DIAGNOSIS — I5031 Acute diastolic (congestive) heart failure: Secondary | ICD-10-CM | POA: Diagnosis not present

## 2015-08-18 DIAGNOSIS — I272 Other secondary pulmonary hypertension: Secondary | ICD-10-CM | POA: Diagnosis present

## 2015-08-18 DIAGNOSIS — R0602 Shortness of breath: Secondary | ICD-10-CM | POA: Diagnosis not present

## 2015-08-18 DIAGNOSIS — I2510 Atherosclerotic heart disease of native coronary artery without angina pectoris: Secondary | ICD-10-CM

## 2015-08-18 DIAGNOSIS — I63113 Cerebral infarction due to embolism of bilateral vertebral arteries: Secondary | ICD-10-CM | POA: Insufficient documentation

## 2015-08-18 DIAGNOSIS — N183 Chronic kidney disease, stage 3 unspecified: Secondary | ICD-10-CM | POA: Diagnosis present

## 2015-08-18 DIAGNOSIS — E785 Hyperlipidemia, unspecified: Secondary | ICD-10-CM | POA: Diagnosis not present

## 2015-08-18 DIAGNOSIS — Z8673 Personal history of transient ischemic attack (TIA), and cerebral infarction without residual deficits: Secondary | ICD-10-CM

## 2015-08-18 DIAGNOSIS — I130 Hypertensive heart and chronic kidney disease with heart failure and stage 1 through stage 4 chronic kidney disease, or unspecified chronic kidney disease: Secondary | ICD-10-CM | POA: Diagnosis not present

## 2015-08-18 DIAGNOSIS — Z96652 Presence of left artificial knee joint: Secondary | ICD-10-CM | POA: Diagnosis not present

## 2015-08-18 DIAGNOSIS — Z7982 Long term (current) use of aspirin: Secondary | ICD-10-CM | POA: Diagnosis not present

## 2015-08-18 DIAGNOSIS — R079 Chest pain, unspecified: Secondary | ICD-10-CM | POA: Diagnosis present

## 2015-08-18 DIAGNOSIS — Z66 Do not resuscitate: Secondary | ICD-10-CM | POA: Diagnosis not present

## 2015-08-18 DIAGNOSIS — R0600 Dyspnea, unspecified: Secondary | ICD-10-CM | POA: Diagnosis not present

## 2015-08-18 DIAGNOSIS — R0682 Tachypnea, not elsewhere classified: Secondary | ICD-10-CM | POA: Diagnosis not present

## 2015-08-18 HISTORY — DX: Non-ST elevation (NSTEMI) myocardial infarction: I21.4

## 2015-08-18 HISTORY — PX: CARDIAC CATHETERIZATION: SHX172

## 2015-08-18 LAB — BASIC METABOLIC PANEL
Anion gap: 9 (ref 5–15)
BUN: 30 mg/dL — AB (ref 6–20)
CHLORIDE: 105 mmol/L (ref 101–111)
CO2: 22 mmol/L (ref 22–32)
Calcium: 9.4 mg/dL (ref 8.9–10.3)
Creatinine, Ser: 1.17 mg/dL — ABNORMAL HIGH (ref 0.44–1.00)
GFR calc Af Amer: 46 mL/min — ABNORMAL LOW (ref >60)
GFR calc non Af Amer: 40 mL/min — ABNORMAL LOW (ref >60)
GLUCOSE: 112 mg/dL — AB (ref 65–99)
Potassium: 4.9 mmol/L (ref 3.5–5.1)
SODIUM: 136 mmol/L (ref 135–145)

## 2015-08-18 LAB — CBC
HEMATOCRIT: 42.1 % (ref 36.0–46.0)
Hemoglobin: 13.5 g/dL (ref 12.0–15.0)
MCH: 29.5 pg (ref 26.0–34.0)
MCHC: 32.1 g/dL (ref 30.0–36.0)
MCV: 92.1 fL (ref 78.0–100.0)
Platelets: 276 10*3/uL (ref 150–400)
RBC: 4.57 MIL/uL (ref 3.87–5.11)
RDW: 14.3 % (ref 11.5–15.5)
WBC: 14.9 10*3/uL — AB (ref 4.0–10.5)

## 2015-08-18 LAB — TROPONIN I
TROPONIN I: 2.01 ng/mL — AB (ref <0.031)
TROPONIN I: 5.91 ng/mL — AB (ref <0.031)
Troponin I: 0.53 ng/mL (ref <0.031)

## 2015-08-18 LAB — I-STAT TROPONIN, ED: Troponin i, poc: 0.4 ng/mL (ref 0.00–0.08)

## 2015-08-18 LAB — PROTIME-INR
INR: 1 (ref 0.00–1.49)
PROTHROMBIN TIME: 13.4 s (ref 11.6–15.2)

## 2015-08-18 LAB — BRAIN NATRIURETIC PEPTIDE: B Natriuretic Peptide: 415.4 pg/mL — ABNORMAL HIGH (ref 0.0–100.0)

## 2015-08-18 LAB — MRSA PCR SCREENING: MRSA by PCR: NEGATIVE

## 2015-08-18 LAB — POCT ACTIVATED CLOTTING TIME: ACTIVATED CLOTTING TIME: 380 s

## 2015-08-18 LAB — APTT: APTT: 27 s (ref 24–37)

## 2015-08-18 SURGERY — LEFT HEART CATH AND CORONARY ANGIOGRAPHY

## 2015-08-18 MED ORDER — HEART ATTACK BOUNCING BOOK
Freq: Once | Status: AC
  Administered 2015-08-18: 21:00:00
  Filled 2015-08-18: qty 1

## 2015-08-18 MED ORDER — TRAMADOL HCL 50 MG PO TABS
50.0000 mg | ORAL_TABLET | Freq: Once | ORAL | Status: AC
  Administered 2015-08-18: 50 mg via ORAL
  Filled 2015-08-18: qty 1

## 2015-08-18 MED ORDER — ATORVASTATIN CALCIUM 80 MG PO TABS
80.0000 mg | ORAL_TABLET | Freq: Every day | ORAL | Status: DC
  Administered 2015-08-18 – 2015-08-19 (×2): 80 mg via ORAL
  Filled 2015-08-18 (×3): qty 1

## 2015-08-18 MED ORDER — LOSARTAN POTASSIUM 50 MG PO TABS
50.0000 mg | ORAL_TABLET | Freq: Every day | ORAL | Status: DC
  Administered 2015-08-18 – 2015-08-20 (×3): 50 mg via ORAL
  Filled 2015-08-18 (×3): qty 1

## 2015-08-18 MED ORDER — NITROGLYCERIN IN D5W 200-5 MCG/ML-% IV SOLN
0.0000 ug/min | INTRAVENOUS | Status: DC
  Administered 2015-08-18: 50 ug/min via INTRAVENOUS

## 2015-08-18 MED ORDER — ASPIRIN 81 MG PO CHEW
81.0000 mg | CHEWABLE_TABLET | Freq: Every day | ORAL | Status: DC
  Administered 2015-08-18: 81 mg via ORAL
  Filled 2015-08-18: qty 1

## 2015-08-18 MED ORDER — CLOPIDOGREL BISULFATE 300 MG PO TABS
ORAL_TABLET | ORAL | Status: DC | PRN
  Administered 2015-08-18: 600 mg via ORAL

## 2015-08-18 MED ORDER — SODIUM CHLORIDE 0.9 % WEIGHT BASED INFUSION
1.0000 mL/kg/h | INTRAVENOUS | Status: DC
  Administered 2015-08-18: 1 mL/kg/h via INTRAVENOUS

## 2015-08-18 MED ORDER — ASPIRIN EC 81 MG PO TBEC
81.0000 mg | DELAYED_RELEASE_TABLET | Freq: Every day | ORAL | Status: DC
  Administered 2015-08-19 – 2015-08-20 (×2): 81 mg via ORAL
  Filled 2015-08-18 (×2): qty 1

## 2015-08-18 MED ORDER — SODIUM CHLORIDE 0.9 % IJ SOLN
3.0000 mL | INTRAMUSCULAR | Status: DC | PRN
Start: 2015-08-18 — End: 2015-08-18

## 2015-08-18 MED ORDER — CLOPIDOGREL BISULFATE 75 MG PO TABS
75.0000 mg | ORAL_TABLET | Freq: Every day | ORAL | Status: DC
  Administered 2015-08-19 – 2015-08-20 (×2): 75 mg via ORAL
  Filled 2015-08-18: qty 1

## 2015-08-18 MED ORDER — NITROGLYCERIN IN D5W 200-5 MCG/ML-% IV SOLN
3.0000 ug/min | INTRAVENOUS | Status: DC
  Administered 2015-08-18: 5 ug/min via INTRAVENOUS

## 2015-08-18 MED ORDER — ACETAMINOPHEN 325 MG PO TABS
650.0000 mg | ORAL_TABLET | ORAL | Status: DC | PRN
  Administered 2015-08-18 – 2015-08-19 (×2): 650 mg via ORAL
  Filled 2015-08-18 (×2): qty 2

## 2015-08-18 MED ORDER — OXYCODONE-ACETAMINOPHEN 5-325 MG PO TABS
1.0000 | ORAL_TABLET | Freq: Once | ORAL | Status: AC
  Administered 2015-08-18: 1 via ORAL
  Filled 2015-08-18: qty 1

## 2015-08-18 MED ORDER — SODIUM CHLORIDE 0.9 % IV SOLN
250.0000 mg | INTRAVENOUS | Status: DC | PRN
Start: 2015-08-18 — End: 2015-08-18
  Administered 2015-08-18: 1.75 mg/kg/h via INTRAVENOUS

## 2015-08-18 MED ORDER — SODIUM CHLORIDE 0.9 % WEIGHT BASED INFUSION
3.0000 mL/kg/h | INTRAVENOUS | Status: DC
  Administered 2015-08-18: 3 mL/kg/h via INTRAVENOUS

## 2015-08-18 MED ORDER — ONDANSETRON HCL 4 MG/2ML IJ SOLN: 4.0000 mg | Freq: Four times a day (QID) | INTRAMUSCULAR | Status: DC | PRN

## 2015-08-18 MED ORDER — MIDAZOLAM HCL 2 MG/2ML IJ SOLN
INTRAMUSCULAR | Status: AC
  Filled 2015-08-18: qty 4

## 2015-08-18 MED ORDER — NITROGLYCERIN 0.4 MG SL SUBL
0.4000 mg | SUBLINGUAL_TABLET | SUBLINGUAL | Status: DC | PRN
  Administered 2015-08-18: 0.4 mg via SUBLINGUAL
  Filled 2015-08-18: qty 1

## 2015-08-18 MED ORDER — SIMVASTATIN 20 MG PO TABS: 20.0000 mg | ORAL_TABLET | Freq: Every evening | ORAL | Status: DC

## 2015-08-18 MED ORDER — HEPARIN (PORCINE) IN NACL 100-0.45 UNIT/ML-% IJ SOLN
700.0000 [IU]/h | INTRAMUSCULAR | Status: DC
  Administered 2015-08-18: 700 [IU]/h via INTRAVENOUS
  Filled 2015-08-18 (×2): qty 250

## 2015-08-18 MED ORDER — ANGIOPLASTY BOOK
Freq: Once | Status: AC
  Administered 2015-08-18: 21:00:00
  Filled 2015-08-18: qty 1

## 2015-08-18 MED ORDER — ASPIRIN 81 MG PO CHEW
324.0000 mg | CHEWABLE_TABLET | Freq: Once | ORAL | Status: AC
  Administered 2015-08-18: 324 mg via ORAL
  Filled 2015-08-18: qty 4

## 2015-08-18 MED ORDER — HYDRALAZINE HCL 20 MG/ML IJ SOLN
10.0000 mg | Freq: Four times a day (QID) | INTRAMUSCULAR | Status: DC | PRN
  Administered 2015-08-18: 10 mg via INTRAVENOUS
  Filled 2015-08-18: qty 1

## 2015-08-18 MED ORDER — NITROGLYCERIN 1 MG/10 ML FOR IR/CATH LAB
INTRA_ARTERIAL | Status: AC
  Filled 2015-08-18: qty 10

## 2015-08-18 MED ORDER — NITROGLYCERIN IN D5W 200-5 MCG/ML-% IV SOLN
0.0000 ug/min | Freq: Once | INTRAVENOUS | Status: AC
  Administered 2015-08-18: 5 ug/min via INTRAVENOUS
  Filled 2015-08-18: qty 250

## 2015-08-18 MED ORDER — FUROSEMIDE 20 MG PO TABS
20.0000 mg | ORAL_TABLET | Freq: Every day | ORAL | Status: DC
  Filled 2015-08-18: qty 1

## 2015-08-18 MED ORDER — MIDAZOLAM HCL 2 MG/2ML IJ SOLN
INTRAMUSCULAR | Status: DC | PRN
  Administered 2015-08-18 (×2): 1 mg via INTRAVENOUS

## 2015-08-18 MED ORDER — METOPROLOL TARTRATE 25 MG PO TABS
25.0000 mg | ORAL_TABLET | Freq: Two times a day (BID) | ORAL | Status: DC
  Administered 2015-08-18 – 2015-08-20 (×5): 25 mg via ORAL
  Filled 2015-08-18 (×5): qty 1

## 2015-08-18 MED ORDER — ONDANSETRON HCL 4 MG/2ML IJ SOLN
4.0000 mg | Freq: Four times a day (QID) | INTRAMUSCULAR | Status: DC | PRN
  Administered 2015-08-18: 4 mg via INTRAVENOUS
  Filled 2015-08-18: qty 2

## 2015-08-18 MED ORDER — CLOPIDOGREL BISULFATE 300 MG PO TABS
ORAL_TABLET | ORAL | Status: AC
  Filled 2015-08-18: qty 1

## 2015-08-18 MED ORDER — HEPARIN BOLUS VIA INFUSION
3000.0000 [IU] | Freq: Once | INTRAVENOUS | Status: AC
  Administered 2015-08-18: 3000 [IU] via INTRAVENOUS
  Filled 2015-08-18: qty 3000

## 2015-08-18 MED ORDER — ASPIRIN 81 MG PO CHEW: 81.0000 mg | CHEWABLE_TABLET | ORAL | Status: DC

## 2015-08-18 MED ORDER — SODIUM CHLORIDE 0.9 % IV SOLN
INTRAVENOUS | Status: AC
  Administered 2015-08-18: 16:00:00 via INTRAVENOUS

## 2015-08-18 MED ORDER — IOHEXOL 350 MG/ML SOLN
INTRAVENOUS | Status: DC | PRN
  Administered 2015-08-18: 155 mL via INTRA_ARTERIAL

## 2015-08-18 MED ORDER — NITROGLYCERIN 1 MG/10 ML FOR IR/CATH LAB
INTRA_ARTERIAL | Status: DC | PRN
  Administered 2015-08-18: 16:00:00

## 2015-08-18 MED ORDER — SODIUM CHLORIDE 0.9 % IJ SOLN
3.0000 mL | Freq: Two times a day (BID) | INTRAMUSCULAR | Status: DC
  Administered 2015-08-18: 3 mL via INTRAVENOUS

## 2015-08-18 MED ORDER — BIVALIRUDIN 250 MG IV SOLR
INTRAVENOUS | Status: AC
  Filled 2015-08-18: qty 250

## 2015-08-18 MED ORDER — SODIUM CHLORIDE 0.9 % IJ SOLN: 3.0000 mL | INTRAMUSCULAR | Status: DC | PRN

## 2015-08-18 MED ORDER — ACETAMINOPHEN 325 MG PO TABS
650.0000 mg | ORAL_TABLET | ORAL | Status: DC | PRN
  Administered 2015-08-18: 650 mg via ORAL
  Filled 2015-08-18: qty 2

## 2015-08-18 MED ORDER — BIVALIRUDIN BOLUS VIA INFUSION - CUPID
INTRAVENOUS | Status: DC | PRN
  Administered 2015-08-18: 52.95 mg via INTRAVENOUS

## 2015-08-18 MED ORDER — SODIUM CHLORIDE 0.9 % IV SOLN: 250.0000 mL | INTRAVENOUS | Status: DC | PRN

## 2015-08-18 MED ORDER — SODIUM CHLORIDE 0.9 % IJ SOLN: 3.0000 mL | Freq: Two times a day (BID) | INTRAMUSCULAR | Status: DC

## 2015-08-18 SURGICAL SUPPLY — 16 items
BALLN EMERGE MR 2.5X12 (BALLOONS) ×4
BALLN ~~LOC~~ EMERGE MR 3.0X15 (BALLOONS) ×4
BALLOON EMERGE MR 2.5X12 (BALLOONS) IMPLANT
BALLOON ~~LOC~~ EMERGE MR 3.0X15 (BALLOONS) IMPLANT
CATH INFINITI 5FR MULTPACK ANG (CATHETERS) ×2 IMPLANT
GUIDE CATH RUNWAY 6FR FR4 (CATHETERS) ×2 IMPLANT
KIT ENCORE 26 ADVANTAGE (KITS) ×2 IMPLANT
KIT HEART LEFT (KITS) ×4 IMPLANT
PACK CARDIAC CATHETERIZATION (CUSTOM PROCEDURE TRAY) ×4 IMPLANT
SHEATH PINNACLE 5F 10CM (SHEATH) ×2 IMPLANT
SHEATH PINNACLE 6F 10CM (SHEATH) ×2 IMPLANT
STENT SYNERGY DES 2.75X16 (Permanent Stent) ×2 IMPLANT
SYR MEDRAD MARK V 150ML (SYRINGE) ×4 IMPLANT
TRANSDUCER W/STOPCOCK (MISCELLANEOUS) ×4 IMPLANT
WIRE COUGAR XT STRL 190CM (WIRE) ×2 IMPLANT
WIRE EMERALD 3MM-J .035X150CM (WIRE) ×2 IMPLANT

## 2015-08-18 NOTE — ED Notes (Signed)
Patient transported to X-ray 

## 2015-08-18 NOTE — ED Notes (Signed)
[facility] pt to [facility].

## 2015-08-18 NOTE — H&P (Signed)
History and Physical  Patient Name: [name]     JXB:[id number]    DOB: 03/24/1923    DOA: 08/18/2015 Referring physician: Dutch Quinthris Pollina, MD PCP: Lieutenant DiegoMCKENZIE,WILLIAM, MD      Chief Complaint: Chest pain  HPI: [name] is a 81 y.o. female with a past medical history significant for HTN and small vessel CVA in 2013 who presents with left chest pain.  The patient notes acute on chronic shortness of breath over the last few weeks until tonight when she was getting back in bed around midnight and noted sudden onset of left chest.  She describes this as a moderate to severe left chest "squeezing" radiating to the center associated with  diaphoresis, but no nausea.  In the ED, the patient's initial ECG was normal but troponin was elevated at 0.5 ng per mL.  She was started on heparin and nitroglycerin drip and experienced easing of her chest discomfort.  The case was discussed with cardiology on call who recommended Medicine admission and transfer to convert NSTEMI.     Review of Systems:  Pt denies any wheezing, cough, purulent sputum, fever, severe dyspnea, leg swelling, orthopnea.  All other systems negative except as just noted or noted in the history of present illness.   Allergies: Meloxicam  Home medications: 1.  aspirin 81 mg daily 2. Furosemide 20 mg daily 3. Metoprolol 25 mg twice daily  4. Simvastatin 20 mg daily 5. Naproxen to 20 mg OTC 3 times daily   Past medical history: 1. CVA, small vessel subcortical in 2013, recent normal carotid ultrasound 2. HTN 3. Osteoporosis 4. History of gout 5. Osteoarthritis with left knee replacement and chronic left ankle/foot pain   Past surgical history: 1. Hysterectomy 2. Left knee replacement 3. Hernia repair 4. Breast cyst removal  Family history:  Family were healthy.   Social History:  Patient lives alone. She still drives. She walks with a walker. She is not able to climb stairs or walk long distances. She is  independent with home housework and ADLs.  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 She has a daughter who lives in [place].      Physical Exam: BP 163/77 mmHg  Pulse 93  Temp(Src) 97.5 F (36.4 C) (Oral)  Resp 22  Wt 73 kg (160 lb 15 oz)  SpO2 99% General appearance: Overweight adult female, alert and in no acute distress.   Eyes: Anicteric, conjunctiva pink, lids and lashes normal.     ENT: No nasal deformity, discharge, or epistaxis.  OP moist without lesions.  Hard of hearing. Skin: Warm and dry.  No jaundice.  No suspicious rashes or lesions. Cardiac: RRR, nl S1-S2, 2/6 SEM at RUSB.  JVP normal.  No LE edema.  Radial and DP pulses 2+ and symmetric. Respiratory: Normal respiratory rate and rhythm.  CTAB without rales or wheezes. Abdomen: Abdomen soft without rigidity.  No TTP. No ascites, distension.   MSK: No deformities or effusions. Neuro: Sensorium intact and responding to questions, attention normal.  Speech is fluent.  Moves all extremities equally and with normal coordination.    Psych: Behavior appropriate.  Affect normal.  No evidence of aural or visual hallucinations or delusions.       Labs on Admission:  The metabolic panel shows normal sodium, potassium, bicarbonate. The serum creatinine is 1.179 g/dL which is stable from previous. The BUN is slightly elevated. The BNP is 4:15 picograms per mL. The  troponin is 0.5 ng per mL.  The complete blood count shows leukocytosis, without anemia or thrombocytopenia.   Radiological Exams on Admission: Personally reviewed: Dg Chest 2 View 08/18/2015   2 view chest shows a prominent right hilum which was evaluated by CT within last few years and is from prominent right pulmonary artery. Therapy to be no acute focal airspace opacities.   EKG: Independently reviewed. sinus rhythm with no ST or T-wave changes.     Assessment/Plan 1. NSTEMI:  This is new.  The patient has evidence of myocardial  necrosis without acute ST changes on ECG.  Her pain is active, with some relief from nitroglycerin.   -Admit to stepdown and tele -Consult to cardiology, appreciate recommendations -Heparin gtt for ACS -Nitroglycerin gtt for chest pain -Continue beta blocker and statin and aspirin at 81 mg -Restart ARB -Trend troponin  2. HTN:   Hypertensive at admission.  The patient had been on losartan until several months ago and this was stopped her daughter reports because of hyperkalemia. During her last hospitalization for her stroke in 2013, she had some mild hyperkalemia which resolved with restarting her furosemide and she was discharged on losartan. -Continue metoprolol and furosemide. -Restart losartan 50 mg daily  3. History of CVA:  -Decrease aspirin 81 mg if stenting and Plavix are needed.       DVT PPx: heparin Diet: nothing by mouth  Consultants: cardiology  Code Status: DO NOT RESUSCITATE; the patient expressed that it is her Gen. wished to avoid being on life support  for a prolonged period of time Family Communication:   the patient's diagnosis, workup, and expected treatment plan including the possibility of catheterization were discussed with the patient and her daughter present at the bedside. CODE STATUS was confirmed in the presence of the daughter.  Medical decision making: What exists of the patient's previous chart was reviewed in depth and the case was discussed with Dr. Blinda Leatherwood. Patient seen 5:05 AM on 08/18/2015.  Disposition Plan:  Admit to Step down for NSTEMI on heparin and nitro gtt.  Consult to cards for expected LHC.      Alberteen Sam [facility] Pager [phone]

## 2015-08-18 NOTE — Progress Notes (Signed)
Site area: right groin  Site Prior to Removal:  Level 0  Pressure Applied For 20 MINUTES    Minutes Beginning at 19:45  Manual:   Yes.    Patient Status During Pull:  WNL   Post Pull Groin Site:  Level 0  Post Pull Instructions Given:  Yes.    Post Pull Pulses Present:  Yes.    Dressing Applied:  Yes.    Comments:

## 2015-08-18 NOTE — Interval H&P Note (Signed)
Cath Lab Visit (complete for each Cath Lab visit)  Clinical Evaluation Leading to the Procedure:   ACS: Yes.    Non-ACS:    Anginal Classification: CCS IV  Anti-ischemic medical therapy: Maximal Therapy (2 or more classes of medications)  Non-Invasive Test Results: No non-invasive testing performed  Prior CABG: No previous CABG      History and Physical Interval Note:  08/18/2015 2:59 PM  [name]  has presented today for surgery, with the diagnosis of cp  The various methods of treatment have been discussed with the patient and family. After consideration of risks, benefits and other options for treatment, the patient has consented to  Procedure(s): Left Heart Cath and Coronary Angiography (N/A) as a surgical intervention .  The patient's history has been reviewed, patient examined, no change in status, stable for surgery.  I have reviewed the patient's chart and labs.  Questions were answered to the patient's satisfaction.     Shell Yandow A

## 2015-08-18 NOTE — ED Notes (Signed)
After arriving from radiology, pt states she had chest pain while in radiology but denied currently.

## 2015-08-18 NOTE — ED Provider Notes (Signed)
CSN: [id number]     Arrival date & time 08/18/15  0208 History  By signing my name below, I, [name], attest that this documentation has been prepared under the direction and in the presence of [name], MD. Electronically Signed: [name], ED Scribe. 08/18/2015. 2:34 AM.  Chief Complaint  Patient presents with  . Shortness of Breath  . Chest Pain   The history is provided by the patient. No language interpreter was used.   HPI Comments: [name] is a 81 y.o. female, with a PMhx of HTN, HLD, and CVA on  aspirin therapy, who presents to the Emergency Department complaining of sudden onset, gradually improving, aching left-sided CP onset after getting up to use the bathroom this morning. Pt notes SOB X 2 weeks that worsened this evening with onset of CP. Also reports diaphoresis at onset of CP that has resolved. Denies nausea or vomiting. Pt reports a possible h/o MI but this PMhx is not found on chart review.   Past Medical History  Diagnosis Date  . Hypertension   . Arthritis   . Gout, joint   . Stroke (HCC)   . Anemia    Past Surgical History  Procedure Laterality Date  . Joint replacement  left knee  . Eye surgery  both  . Dilation and curettage of uterus    . Abdominal hysterectomy    . Hernia repair    . Breast surgery      CYST   History reviewed. No pertinent family history. Social History  Substance Use Topics  . Smoking status: Never Smoker   . Smokeless tobacco: Never Used  . Alcohol Use: No   OB History    No data available     Review of Systems  Constitutional: Positive for diaphoresis. Negative for fever.  Respiratory: Positive for shortness of breath.   Cardiovascular: Positive for chest pain.  Gastrointestinal: Negative for nausea and vomiting.  All other systems reviewed and are negative.  Allergies  Meloxicam  Home Medications   Prior to Admission medications   Medication Sig Start Date End Date Taking?  Authorizing Provider  aspirin 81 MG chewable tablet Chew 81 mg by mouth daily.   Yes Historical Provider, MD  Calcium Carbonate-Vitamin D (CALTRATE 600+D PO) Take 1 tablet by mouth 2 (two) times daily.   Yes Historical Provider, MD  fluticasone (FLONASE) 50 MCG/ACT nasal spray Place 2 sprays into both nostrils as needed.  12/28/13  Yes Historical Provider, MD  furosemide (LASIX) 20 MG tablet Take 20 mg by mouth daily. Take one half tablet daily. 09/28/12  Yes Lesle Chris Black, NP  metoprolol (LOPRESSOR) 50 MG tablet Take 25 mg by mouth 2 (two) times daily.   Yes Historical Provider, MD  Multiple Vitamins-Minerals (MULTIPLE VITAMINS/WOMENS PO) Take 1 tablet by mouth daily. W/vitamin D   Yes Historical Provider, MD  Naproxen Sodium (ALEVE PO) Take 1 tablet by mouth daily as needed (pain).    Yes Historical Provider, MD  Polyethyl Glycol-Propyl Glycol (SYSTANE OP) Apply 1 drop to eye 2 (two) times daily.   Yes Historical Provider, MD  simvastatin (ZOCOR) 20 MG tablet Take 20 mg by mouth every evening.   Yes Historical Provider, MD  triamcinolone cream (KENALOG) 0.1 % Apply 1 application topically daily as needed. For rash   Yes Historical Provider, MD  aspirin (BAYER ASPIRIN) 325 MG tablet Take 1 tablet (325 mg total) by mouth daily. Patient not taking: Reported on 08/18/2015 09/28/12  Lesle ChrisKaren M Black, NP   BP 197/94 mmHg  Pulse 88  Temp(Src) 97.5 F (36.4 C) (Oral)  Resp 22  SpO2 98% Physical Exam  Constitutional: She is oriented to person, place, and time. She appears well-developed and well-nourished. No distress.  HENT:  Head: Normocephalic and atraumatic.  Right Ear: Hearing normal.  Left Ear: Hearing normal.  Nose: Nose normal.  Mouth/Throat: Oropharynx is clear and moist and mucous membranes are normal.  Eyes: Conjunctivae and EOM are normal. Pupils are equal, round, and reactive to light.  Neck: Normal range of motion. Neck supple.  Cardiovascular: Normal rate, regular rhythm, S1 normal,  S2 normal and normal heart sounds.  Exam reveals no gallop and no friction rub.   No murmur heard. Trace BLE edema, left more than right.   Pulmonary/Chest: Breath sounds normal. No respiratory distress. She exhibits no tenderness.  Tachypneic.   Abdominal: Soft. Normal appearance and bowel sounds are normal. There is no hepatosplenomegaly. There is no tenderness. There is no rebound, no guarding, no tenderness at McBurney's point and negative Murphy's sign. No hernia.  Musculoskeletal: Normal range of motion. She exhibits edema.  Neurological: She is alert and oriented to person, place, and time. She has normal strength. No cranial nerve deficit or sensory deficit. Coordination normal. GCS eye subscore is 4. GCS verbal subscore is 5. GCS motor subscore is 6.  Skin: Skin is warm, dry and intact. No rash noted. No cyanosis.  Psychiatric: She has a normal mood and affect. Her speech is normal and behavior is normal. Thought content normal.  Nursing note and vitals reviewed.   ED Course  Procedures  DIAGNOSTIC STUDIES: Oxygen Saturation is 98% on RA, normal by my interpretation.    COORDINATION OF CARE: 2:32 AM Discussed treatment plan with pt at bedside and pt agreed to plan. Cardiac workup ordered.  3:26 AM On reevaluation pt reports gradual improvement of aching CP with aspirin and NTG administered in ED. Discussed lab results with pt and pt's daughter.   Labs Review Labs Reviewed  BASIC METABOLIC PANEL - Abnormal; Notable for the following:    Glucose, Bld 112 (*)    BUN 30 (*)    Creatinine, Ser 1.17 (*)    GFR calc non Af Amer 40 (*)    GFR calc Af Amer 46 (*)    All other components within normal limits  CBC - Abnormal; Notable for the following:    WBC 14.9 (*)    All other components within normal limits  BRAIN NATRIURETIC PEPTIDE - Abnormal; Notable for the following:    B Natriuretic Peptide 415.4 (*)    All other components within normal limits  I-STAT TROPOININ, ED -  Abnormal; Notable for the following:    Troponin i, poc 0.40 (*)    All other components within normal limits  TROPONIN I    Imaging Review Dg Chest 2 View  08/18/2015  CLINICAL DATA:  Shortness of breath and chest pain EXAM: CHEST  2 VIEW COMPARISON:  03/05/2015 FINDINGS: Stable cardiomediastinal silhouette with cardiac size accentuated by lower mediastinal fat pad. Bandlike opacity at the right hilum consistent with scarring and stable from 04/07/2015 chest CT. There is no edema, consolidation, effusion, or pneumothorax. Chronic L1 compression fracture with advanced height loss. IMPRESSION: Stable exam.  No evidence of acute cardiopulmonary disease. Electronically Signed   By: Marnee SpringJonathon  Watts M.D.   On: 08/18/2015 03:12   I have personally reviewed and evaluated these images and lab results as  part of my medical decision-making.   EKG Interpretation   Date/Time:  Tuesday August 18 2015 02:16:55 EST Ventricular Rate:  88 PR Interval:  148 QRS Duration: 67 QT Interval:  366 QTC Calculation: 443 R Axis:   75 Text Interpretation:  Sinus rhythm Right atrial enlargement Abnormal  R-wave progression, early transition Nonspecific repol abnormality,  lateral leads Baseline wander in lead(s) II III aVF No significant change  since last tracing Confirmed by Gokul Waybright  MD, Leily Capek [phone]) on  08/18/2015 2:32:14 AM    EKG Interpretation  Date/Time:  Tuesday August 18 2015 03:32:05 EST Ventricular Rate:  86 PR Interval:  148 QRS Duration: 65 QT Interval:  373 QTC Calculation: 446 R Axis:   69 Text Interpretation:  Sinus rhythm RAE, consider biatrial enlargement Abnormal R-wave progression, early transition No significant change since last tracing Confirmed by Savahanna Almendariz  MD, Alaster Asfaw [phone]) on 08/18/2015 3:57:34 AM        MDM   Final diagnoses:  None  NSTEMI  Presents to the ER for evaluation of chest pain and shortness of breath. Patient reports that she has been having  shortness of breath for several weeks, including dyspnea on exertion. Tonight she developed left-sided chest pain while going to the bathroom. Patient was very hypertensive at arrival. She was instructed aspirin and nitroglycerin. Blood pressure has improved, chest pain has improved but is still present. Her initial troponin is elevated, but EKG does not show any obvious ischemia or infarct. Patient initiated on IV heparin and IV nitroglycerin. Repeat EKG unchanged.  Case discussed with Dr. Leeann Must, radiology. He agreed with treatment plan. Recommends transfer to [facility], hospitalist service. Will consult for further management.  I personally performed the services described in this documentation, which was scribed in my presence. The recorded information has been reviewed and is accurate.    [name], MD 08/19/15 [phone]

## 2015-08-18 NOTE — Progress Notes (Signed)
ANTICOAGULATION CONSULT NOTE - Initial Consult  Pharmacy Consult for IV Heparin Indication: chest pain/ACS  Allergies  Allergen Reactions  . Meloxicam Hives    Patient Measurements: Weight: 160 lb 15 oz (73 kg) Heparin Dosing Weight: 56 kg  Vital Signs: Temp: 97.5 F (36.4 C) (11/08 0217) Temp Source: Oral (11/08 0217) BP: 159/81 mmHg (11/08 0315) Pulse Rate: 87 (11/08 0315)  Labs:  Recent Labs  08/18/15 0231  HGB 13.5  HCT 42.1  PLT 276  CREATININE 1.17*  TROPONINI 0.53*    Estimated Creatinine Clearance: 29.9 mL/min (by C-G formula based on Cr of 1.17).   Medical History: Past Medical History  Diagnosis Date  . Hypertension   . Arthritis   . Gout, joint   . Stroke (HCC)   . Anemia     Medications:   (Not in a hospital admission) Scheduled:  . heparin  3,000 Units Intravenous Once   Infusions:  . heparin      Assessment: 89 yoF c/o SOB x 2 weeks and L sided chest pain starting tonight.  IV Heparin per Rx for ACS. 1st trop = 0.40 Goal of Therapy:  Heparin level 0.3-0.7 units/ml Monitor platelets by anticoagulation protocol: Yes   Plan:   Baseline coags stat  Heparin 3000 unit bolus x1  Start heparin drip @ 700 units/hr  Daily CBC/HL  Check 1st HL in 8 hours  [name], [name] 08/18/2015,3:51 AM

## 2015-08-18 NOTE — ED Notes (Signed)
Pt ambulated to restroom and back to stretcher with assistance. Pt has shortness of breath on exertion. Informed pt she would need to use bedpan instead of walking to restroom.

## 2015-08-18 NOTE — Progress Notes (Signed)
Patient seen this morning. Presently pain-free. Troponin is mildly increased. Patient is 81 years old but lives alone and is functional. We discussed options today. Plan cardiac catheterization. The risks and benefits were discussed and she agrees to proceed. Continue aspirin, heparin, beta blocker, nitroglycerin and statin. Hold Lasix prior to catheterization. Brian Crenshaw  

## 2015-08-18 NOTE — Progress Notes (Signed)
PATIENT DETAILS Name: [name] Age: 81 y.o. Sex: female Date of Birth: 1922-10-18 Admit Date: 08/18/2015 Admitting Physician Alberteen Sam, MD ZOX:[id number], MD  Subjective: No chest pain today. Feels better than yesterday  Assessment/Plan: Principal Problem: NSTEMI (non-ST elevated myocardial infarction):No further chest pain, continue IV Heparin, IV NTG gtt,ASA, Stain and Beta Blocker-await cards recommendations-re LHC vs medical management.  Active Problems: Hypertension:fluctuating-but mod controlled-continue IV NTG, metoprolol and losaratn. Will optimize once off NTG and depending on cards recommendations  CKD (chronic kidney disease), stage NWG:[id number] close to usual baseline, follow   History of stroke:very minimal residual right sided weakness-continue ASA/Statin  Disposition: Remain inpatient  Antimicrobial agents  See below  Anti-infectives    None      DVT Prophylaxis:  Heparin gtt  Code Status: DNR  Family Communication Daughter/Son in law at bedside  Procedures: None  CONSULTS:  cardiology  Time spent 40 minutes-Greater than 50% of this time was spent in counseling, explanation of diagnosis, planning of further management, and coordination of care.  MEDICATIONS: Scheduled Meds: . aspirin  81 mg Oral Daily  . atorvastatin  80 mg Oral q1800  . furosemide  20 mg Oral Daily  . losartan  50 mg Oral Daily  . metoprolol  25 mg Oral BID   Continuous Infusions: . heparin 700 Units/hr (08/18/15 0428)  . nitroGLYCERIN 5 mcg/min (08/18/15 0615)   PRN Meds:.acetaminophen, ondansetron (ZOFRAN) IV    PHYSICAL EXAM: Vital signs in last 24 hours: Filed Vitals:   08/18/15 0445 08/18/15 0500 08/18/15 0515 08/18/15 0555  BP: 158/81 149/75 151/79 187/92  Pulse: 96 104 102 95  Temp:    97.6 F (36.4 C)  TempSrc:    Oral  Resp: Height:     (1.549 m)  Weight:    70.58 kg (155 lb 9.6 oz)    SpO2: 98% 99% 98% 94%    Weight change:  Filed Weights   08/18/15 0341 08/18/15 0555  Weight: 73 kg (160 lb 15 oz) 70.58 kg (155 lb 9.6 oz)   Body mass index is 29.42 kg/(m^2).   Gen Exam: Awake and alert with clear speech.  Neck: Supple, No JVD.   Chest: B/L Clear.   CVS: S1 S2 Regular, + syst murmurs.  Abdomen: soft, BS +, non tender, non distended. Extremities: no edema, lower extremities warm to touch. Neurologic: Non Focal.   Skin: No Rash.   Wounds: N/A.    Intake/Output from previous day:  Intake/Output Summary (Last 24 hours) at 08/18/15 0824 Last data filed at 08/18/15 0800  Gross per 24 hour  Intake      0 ml  Output      0 ml  Net      0 ml     LAB RESULTS: CBC  Recent Labs Lab 08/18/15 0231  WBC 14.9*  HGB 13.5  HCT 42.1  PLT 276  MCV 92.1  MCH 29.5  MCHC 32.1  RDW 14.3    Chemistries   Recent Labs Lab 08/18/15 0231  NA 136  K 4.9  CL 105  CO2 22  GLUCOSE 112*  BUN 30*  CREATININE 1.17*  CALCIUM 9.4    CBG: No results for input(s): GLUCAP in the last 168 hours.  GFR Estimated Creatinine Clearance: 28.7 mL/min (by C-G formula based on Cr of 1.17).  Coagulation profile  Recent Labs Lab 08/18/15  0401  INR 1.00    Cardiac Enzymes  Recent Labs Lab 08/18/15 0231  TROPONINI 0.53*    Invalid input(s): POCBNP No results for input(s): DDIMER in the last 72 hours. No results for input(s): HGBA1C in the last 72 hours. No results for input(s): CHOL, HDL, LDLCALC, TRIG, CHOLHDL, LDLDIRECT in the last 72 hours. No results for input(s): TSH, T4TOTAL, T3FREE, THYROIDAB in the last 72 hours.  Invalid input(s): FREET3 No results for input(s): VITAMINB12, FOLATE, FERRITIN, TIBC, IRON, RETICCTPCT in the last 72 hours. No results for input(s): LIPASE, AMYLASE in the last 72 hours.  Urine Studies No results for input(s): UHGB, CRYS in the last 72 hours.  Invalid input(s): UACOL, UAPR, USPG, UPH, UTP, UGL, UKET, UBIL, UNIT, UROB,  ULEU, UEPI, UWBC, URBC, UBAC, CAST, UCOM, BILUA  MICROBIOLOGY: Recent Results (from the past 240 hour(s))  MRSA PCR Screening     Status: None   Collection Time: 08/18/15  5:49 AM  Result Value Ref Range Status   MRSA by PCR NEGATIVE NEGATIVE Final    Comment:        The GeneXpert MRSA Assay (FDA approved for NASAL specimens only), is one component of a comprehensive MRSA colonization surveillance program. It is not intended to diagnose MRSA infection nor to guide or monitor treatment for MRSA infections.     RADIOLOGY STUDIES/RESULTS: Dg Chest 2 View  08/18/2015  CLINICAL DATA:  Shortness of breath and chest pain EXAM: CHEST  2 VIEW COMPARISON:  03/05/2015 FINDINGS: Stable cardiomediastinal silhouette with cardiac size accentuated by lower mediastinal fat pad. Bandlike opacity at the right hilum consistent with scarring and stable from 04/07/2015 chest CT. There is no edema, consolidation, effusion, or pneumothorax. Chronic L1 compression fracture with advanced height loss. IMPRESSION: Stable exam.  No evidence of acute cardiopulmonary disease. Electronically Signed   By: Marnee SpringJonathon  Watts M.D.   On: 08/18/2015 03:12    Jeoffrey MassedGHIMIRE,SHANKER, MD  [facility] Pager:[phone]  If 7PM-7AM, please contact night-coverage [email or web address] Password [email or web address] 08/18/2015, 8:24 AM   LOS: 0 days

## 2015-08-18 NOTE — ED Notes (Signed)
Gave report to [facility].  

## 2015-08-18 NOTE — Progress Notes (Signed)
UR Completed Maurene Hollin Graves-Bigelow, RN,BSN [phone]  

## 2015-08-18 NOTE — ED Notes (Signed)
Pt states that she has had SOB x 2 weeks but tonight began having L sided chest pain. Alert and oriented.

## 2015-08-18 NOTE — Consult Note (Signed)
Reason for Consult: chest pain with elevated troponin Primary Cardiologist: new Referring Physician: Dr. Pollina  [name] is an 81 y.o. female.  HPI: [name] is a 81 yo woman with PMH of HTN, CVA 2013 who woke up with dyspnea and substernal to left-sided chest pain at midnight after going to the bathroom. The pain didn't resolve so she reported tot he ER. She received asa/SL NTG and the pain went away. She noted some associated dyspnea and mild diaphoresis. She has also noted increased dyspnea with minimal exertion over the last few weeks. She denies infectious symptoms, recent travel, changes in diet recently. She was started on heparin gtt in the ER and received aspirin.      Past Medical History  Diagnosis Date  . Hypertension   . Arthritis   . Gout, joint   . Stroke (HCC)   . Anemia     Past Surgical History  Procedure Laterality Date  . Joint replacement  left knee  . Eye surgery  both  . Dilation and curettage of uterus    . Abdominal hysterectomy    . Hernia repair    . Breast surgery      CYST    History reviewed. No pertinent family history.  Social History:  reports that she has never smoked. She has never used smokeless tobacco. She reports that she does not drink alcohol or use illicit drugs.  Allergies:  Allergies  Allergen Reactions  . Meloxicam Hives    Medications:  I have reviewed the patient's current medications. Prior to Admission:  Prescriptions prior to admission  Medication Sig Dispense Refill Last Dose  . aspirin 81 MG chewable tablet Chew 81 mg by mouth daily.   08/17/2015 at 0630  . Calcium Carbonate-Vitamin D (CALTRATE 600+D PO) Take 1 tablet by mouth 2 (two) times daily.   08/17/2015 at Unknown time  . fluticasone (FLONASE) 50 MCG/ACT nasal spray Place 2 sprays into both nostrils as needed.    08/17/2015 at Unknown time  . furosemide (LASIX) 20 MG tablet Take 20 mg by mouth daily. Take one half tablet daily.   08/17/2015 at Unknown  time  . metoprolol (LOPRESSOR) 50 MG tablet Take 25 mg by mouth 2 (two) times daily.   08/17/2015 at 1700  . Multiple Vitamins-Minerals (MULTIPLE VITAMINS/WOMENS PO) Take 1 tablet by mouth daily. W/vitamin D   08/17/2015 at Unknown time  . Naproxen Sodium (ALEVE PO) Take 1 tablet by mouth daily as needed (pain).    08/17/2015 at Unknown time  . Polyethyl Glycol-Propyl Glycol (SYSTANE OP) Apply 1 drop to eye 2 (two) times daily.   08/17/2015 at Unknown time  . simvastatin (ZOCOR) 20 MG tablet Take 20 mg by mouth every evening.   08/17/2015 at Unknown time  . triamcinolone cream (KENALOG) 0.1 % Apply 1 application topically daily as needed. For rash   08/17/2015 at Unknown time  . aspirin (BAYER ASPIRIN) 325 MG tablet Take 1 tablet (325 mg total) by mouth daily. (Patient not taking: Reported on 08/18/2015)   Taking   Scheduled: . aspirin  81 mg Oral Daily  . furosemide  20 mg Oral Daily  . losartan  50 mg Oral Daily  . metoprolol  25 mg Oral BID  . simvastatin  20 mg Oral QPM    Results for orders placed or performed during the hospital encounter of 08/18/15 (from the past 48 hour(s))  Basic metabolic panel     Status: Abnormal   Collection   Time: 08/18/15  2:31 AM  Result Value Ref Range   Sodium 136 135 - 145 mmol/L   Potassium 4.9 3.5 - 5.1 mmol/L   Chloride 105 101 - 111 mmol/L   CO2 22 22 - 32 mmol/L   Glucose, Bld 112 (H) 65 - 99 mg/dL   BUN 30 (H) 6 - 20 mg/dL   Creatinine, Ser 1.17 (H) 0.44 - 1.00 mg/dL   Calcium 9.4 8.9 - 10.3 mg/dL   GFR calc non Af Amer 40 (L) >60 mL/min   GFR calc Af Amer 46 (L) >60 mL/min    Comment: (NOTE) The eGFR has been calculated using the CKD EPI equation. This calculation has not been validated in all clinical situations. eGFR's persistently <60 mL/min signify possible Chronic Kidney Disease.    Anion gap 9 5 - 15  CBC     Status: Abnormal   Collection Time: 08/18/15  2:31 AM  Result Value Ref Range   WBC 14.9 (H) 4.0 - 10.5 K/uL   RBC 4.57 3.87 -  5.11 MIL/uL   Hemoglobin 13.5 12.0 - 15.0 g/dL   HCT 42.1 36.0 - 46.0 %   MCV 92.1 78.0 - 100.0 fL   MCH 29.5 26.0 - 34.0 pg   MCHC 32.1 30.0 - 36.0 g/dL   RDW 14.3 11.5 - 15.5 %   Platelets 276 150 - 400 K/uL  Brain natriuretic peptide     Status: Abnormal   Collection Time: 08/18/15  2:31 AM  Result Value Ref Range   B Natriuretic Peptide 415.4 (H) 0.0 - 100.0 pg/mL  Troponin I     Status: Abnormal   Collection Time: 08/18/15  2:31 AM  Result Value Ref Range   Troponin I 0.53 (HH) <0.031 ng/mL    Comment:        POSSIBLE MYOCARDIAL ISCHEMIA. SERIAL TESTING RECOMMENDED. CRITICAL RESULT CALLED TO, READ BACK BY AND VERIFIED WITH: C SOK RN @ [phone] BY C DAVIS   I-stat troponin, ED (not at [facility], [facility])     Status: Abnormal   Collection Time: 08/18/15  2:41 AM  Result Value Ref Range   Troponin i, poc 0.40 (HH) 0.00 - 0.08 ng/mL   Comment NOTIFIED PHYSICIAN    Comment 3            Comment: Due to the release kinetics of cTnI, a negative result within the first hours of the onset of symptoms does not rule out myocardial infarction with certainty. If myocardial infarction is still suspected, repeat the test at appropriate intervals.   APTT     Status: None   Collection Time: 08/18/15  4:01 AM  Result Value Ref Range   aPTT 27 24 - 37 seconds  Protime-INR     Status: None   Collection Time: 08/18/15  4:01 AM  Result Value Ref Range   Prothrombin Time 13.4 11.6 - 15.2 seconds   INR 1.00 0.00 - 1.49    Dg Chest 2 View  08/18/2015  CLINICAL DATA:  Shortness of breath and chest pain EXAM: CHEST  2 VIEW COMPARISON:  03/05/2015 FINDINGS: Stable cardiomediastinal silhouette with cardiac size accentuated by lower mediastinal fat pad. Bandlike opacity at the right hilum consistent with scarring and stable from 04/07/2015 chest CT. There is no edema, consolidation, effusion, or pneumothorax. Chronic L1 compression fracture with advanced height loss. IMPRESSION: Stable exam.  No evidence of  acute cardiopulmonary disease. Electronically Signed   By: Monte Fantasia M.D.   On: 08/18/2015 03:12  Review of Systems  Constitutional: Positive for diaphoresis. Negative for fever, chills, weight loss and malaise/fatigue.  HENT: Positive for hearing loss. Negative for ear pain and tinnitus.   Eyes: Negative for double vision, photophobia and pain.  Respiratory: Positive for shortness of breath. Negative for hemoptysis and sputum production.   Cardiovascular: Positive for chest pain. Negative for PND.  Gastrointestinal: Negative for heartburn, nausea and vomiting.  Genitourinary: Negative for dysuria and urgency.  Musculoskeletal: Positive for back pain. Negative for myalgias and neck pain.  Skin: Negative for rash.  Neurological: Negative for dizziness, tingling, sensory change and headaches.  Endo/Heme/Allergies: Negative for polydipsia. Bruises/bleeds easily.  Psychiatric/Behavioral: Negative for depression, suicidal ideas, hallucinations and substance abuse.   Blood pressure 187/92, pulse 95, temperature 97.6 F (36.4 C), temperature source Oral, resp. rate 22, height 5' 1" (1.549 m), weight 70.58 kg (155 lb 9.6 oz), SpO2 94 %. Physical Exam  Nursing note and vitals reviewed. Constitutional: She is oriented to person, place, and time. She appears well-developed and well-nourished. No distress.  HENT:  Head: Normocephalic and atraumatic.  Nose: Nose normal.  Mouth/Throat: Oropharynx is clear and moist. No oropharyngeal exudate.  Eyes: Conjunctivae and EOM are normal. Pupils are equal, round, and reactive to light. No scleral icterus.  Neck: Normal range of motion. Neck supple. JVD present. No tracheal deviation present.  JVP 2 cm above clavicle, slight HJR  Cardiovascular: Normal rate, regular rhythm, normal heart sounds and intact distal pulses.  Exam reveals no gallop.   No murmur heard. Respiratory: Effort normal and breath sounds normal. No respiratory distress. She has no  wheezes. She has no rales.  GI: Soft. Bowel sounds are normal. She exhibits no distension. There is no tenderness. There is no rebound.  Musculoskeletal: Normal range of motion. She exhibits no edema or tenderness.  Neurological: She is alert and oriented to person, place, and time. No cranial nerve deficit. Coordination normal.  Skin: Skin is warm and dry. No rash noted. She is not diaphoretic. No erythema.  Psychiatric: She has a normal mood and affect. Her behavior is normal. Thought content normal.   Labs reviewed;  BNP 415 Trop 0.5 6/15 Echo: EF 65-70%, grade I DD, calcified AV, trivial MR, mild/moderate TR, pulmonary HTN EKG: SR with RAE, early transition  Assessment/Plan: Ms. Aschenbrenner is a 80 yo woman with PMH of HTN, CVA 2013 who woke up with dyspnea and substernal to left-sided chest pain at midnight after going to the bathroom and found to have elevated troponin. Differential diagnosis is musculoskeletal pain, esophageal spasm, GERD, aortic dissection, pericarditis, ACS/NSTEMI among other etiologies. I favor a diagnosis of NSTEMI given age, symptoms. Will start heparin, continue aspirin, admit to stepdown and watch closely for any changes in symptoms. Discussed possible LHC with her and daughter/son-in-law and although she is DNR she would be willing to have LHC and have DNR reversed for the procedure.   Plan 1. NPO for LHC in AM. I discussed this plan with the patient/family 2. trend cardiac markers, observation on telemetry, admit to telemetry, heparin gtt 3. plan for LHC in AM; if symptoms change, low threshold to activate cath lab 4. asa 81 mg, heparin gtt, metoprolol 25 mg bid  5. atorvastatin 80 mg qHS first dose now 6. hba1c, tsh, lipid panel, BNP 7. Echocardiogram in AM   Symir Mah 08/18/2015, 6:06 AM

## 2015-08-18 NOTE — ED Notes (Signed)
Placed patient on oxygen via [place] at 2L. Pt states she is short of breath. Mild distress noted.

## 2015-08-18 NOTE — H&P (View-Only) (Signed)
Patient seen this morning. Presently pain-free. Troponin is mildly increased. Patient is 81 years old but lives alone and is functional. We discussed options today. Plan cardiac catheterization. The risks and benefits were discussed and she agrees to proceed. Continue aspirin, heparin, beta blocker, nitroglycerin and statin. Hold Lasix prior to catheterization. Paula MillersBrian Jamesina Gaugh

## 2015-08-18 NOTE — Care Management Note (Signed)
Case Management Note  Patient Details  Name: Casimer LeekMary C Bordonaro MRN: [id number] Date of Birth: 10/01/1923  Subjective/Objective:     Pt admitted for NStemi. Plan for cardiac cath. Pt is from home alone.            Action/Plan: CM to monitor for disposition needs.    Expected Discharge Date:                  Expected Discharge Plan:  Home w Home Health Services  In-House Referral:     Discharge planning Services  CM Consult  Post Acute Care Choice:    Choice offered to:     DME Arranged:    DME Agency:     HH Arranged:    HH Agency:     Status of Service:  In process, will continue to follow  Medicare Important Message Given:    Date Medicare IM Given:    Medicare IM give by:    Date Additional Medicare IM Given:    Additional Medicare Important Message give by:     If discussed at Long Length of Stay Meetings, dates discussed:    Additional Comments:  Gala LewandowskyGraves-Bigelow, Allegra Cerniglia Kaye, RN 08/18/2015, 1:49 PM

## 2015-08-19 ENCOUNTER — Inpatient Hospital Stay (HOSPITAL_COMMUNITY): Payer: Medicare Other

## 2015-08-19 ENCOUNTER — Encounter (HOSPITAL_COMMUNITY): Payer: Self-pay | Admitting: Cardiovascular Disease

## 2015-08-19 ENCOUNTER — Telehealth: Payer: Self-pay | Admitting: Cardiology

## 2015-08-19 DIAGNOSIS — R0600 Dyspnea, unspecified: Secondary | ICD-10-CM

## 2015-08-19 DIAGNOSIS — R471 Dysarthria and anarthria: Secondary | ICD-10-CM

## 2015-08-19 DIAGNOSIS — I214 Non-ST elevation (NSTEMI) myocardial infarction: Principal | ICD-10-CM

## 2015-08-19 LAB — CBC
HEMATOCRIT: 38.1 % (ref 36.0–46.0)
Hemoglobin: 11.8 g/dL — ABNORMAL LOW (ref 12.0–15.0)
MCH: 28.9 pg (ref 26.0–34.0)
MCHC: 31 g/dL (ref 30.0–36.0)
MCV: 93.4 fL (ref 78.0–100.0)
PLATELETS: 260 10*3/uL (ref 150–400)
RBC: 4.08 MIL/uL (ref 3.87–5.11)
RDW: 14.6 % (ref 11.5–15.5)
WBC: 11.8 10*3/uL — AB (ref 4.0–10.5)

## 2015-08-19 LAB — BASIC METABOLIC PANEL
ANION GAP: 5 (ref 5–15)
BUN: 25 mg/dL — AB (ref 6–20)
CO2: 26 mmol/L (ref 22–32)
CREATININE: 1.18 mg/dL — AB (ref 0.44–1.00)
Calcium: 9.4 mg/dL (ref 8.9–10.3)
Chloride: 105 mmol/L (ref 101–111)
GFR, EST AFRICAN AMERICAN: 46 mL/min — AB (ref >60)
GFR, EST NON AFRICAN AMERICAN: 39 mL/min — AB (ref >60)
GLUCOSE: 114 mg/dL — AB (ref 65–99)
POTASSIUM: 5.2 mmol/L — AB (ref 3.5–5.1)
Sodium: 136 mmol/L (ref 135–145)

## 2015-08-19 MED ORDER — ENOXAPARIN SODIUM 30 MG/0.3ML ~~LOC~~ SOLN
30.0000 mg | SUBCUTANEOUS | Status: DC
  Administered 2015-08-20: 30 mg via SUBCUTANEOUS
  Filled 2015-08-19 (×2): qty 0.3

## 2015-08-19 MED ORDER — FUROSEMIDE 20 MG PO TABS
20.0000 mg | ORAL_TABLET | Freq: Once | ORAL | Status: AC
  Administered 2015-08-19: 20 mg via ORAL
  Filled 2015-08-19: qty 1

## 2015-08-19 MED ORDER — ENOXAPARIN SODIUM 40 MG/0.4ML ~~LOC~~ SOLN
40.0000 mg | SUBCUTANEOUS | Status: DC
  Administered 2015-08-19: 10:00:00 40 mg via SUBCUTANEOUS
  Filled 2015-08-19 (×2): qty 0.4

## 2015-08-19 NOTE — Progress Notes (Signed)
Notified by RN that patient had TRANSIENT dysarthria and mild right sided weakness-noted around 3:50 pm. During my evaluation-speech almost back to baseline, no focal deficits-except for trace right facial droop. Have ordered a CT Head Stat, will consult Neurology for assistance-spoke with Dr Cyril Mourningamillo.

## 2015-08-19 NOTE — Progress Notes (Signed)
1550Called in to pts room, pt sitting, son claimed noted having difficulty speaking, unable to understand  After assisting oob. Assisted pt back in bed, with slight  rt arm grip weakness, initial slurred speech and slight rt facial drooping noted,speech  clearing after getting back in bed.  Code stroke protocol followed. . Dr Roseanna RainbowGhimere , rapid response seen and examined  patient.stat CT .head ordered. Kept monitored.

## 2015-08-19 NOTE — Progress Notes (Signed)
Code stroke called at 1537, LSN 1530.  As per RN and family, patient was reading the newspaper/watching TV then dozed off for about 5 minutes and awoke disoriented with garbled speech, and droop.  Upon my arrival to patients room, symptoms resolved.  Patient transported to CT scan via bed with monitor.  NIHSS 0.  Dr. Cyril Mourningamillo at bedside.  Stroke swallow screen attempted, however patient has history of dysphagia.  Stroke swallow screen ended and order for ST to evaluate.  Patient, family and RN aware.Rn to call if assistance needed

## 2015-08-19 NOTE — Progress Notes (Signed)
PATIENT DETAILS Name: [name] Age: 81 y.o. Sex: female Date of Birth: 11/28/1922 Admit Date: 08/18/2015 Admitting Physician Alberteen Sam, MD UJW:JXBJYNWG,NFAOZHY, MD  Subjective: No chest pain or shortness of breath today.   Assessment/Plan: Principal Problem: NSTEMI (non-ST elevated myocardial infarction):No further chest pain, initially managed with  IV Heparin, IV NTG gtt,ASA, Stain and Beta Blocker-subsequently seen by cardiology-underwent LHC-now s/p PCI of distal RCA-recommendations are to continue with aspirin, Plavix, statin and metoprolol. Check echocardiogram to assess LV function. Will transfer to telemetry, ambulate with physical therapy and suspect if continues to do well will discharge home tomorrow morning.   Active Problems: Acute diastolic heart failure: Minimal volume overload- Await echocardiogram, Lasix 1 today. Follow.  Hypertension: Controlled-continue metoprolol and losartan. Follow   CKD (chronic kidney disease), stage QMV:HQIONGEXBM close to usual baseline, follow   History of stroke:very minimal residual right sided weakness-continue ASA/Plavix/Statin  Disposition: Remain inpatient-transfer to telemetry  Antimicrobial agents  See below  Anti-infectives    None      DVT Prophylaxis: Prophylactic Lovenox  Code Status: DNR  Family Communication Daughter at bedside  Procedures: None  CONSULTS:  cardiology  Time spent 25 minutes-Greater than 50% of this time was spent in counseling, explanation of diagnosis, planning of further management, and coordination of care.  MEDICATIONS: Scheduled Meds: . aspirin EC  81 mg Oral Daily  . atorvastatin  80 mg Oral q1800  . clopidogrel  75 mg Oral Q breakfast  . enoxaparin (LOVENOX) injection  40 mg Subcutaneous Q24H  . losartan  50 mg Oral Daily  . metoprolol  25 mg Oral BID   Continuous Infusions:   PRN Meds:.acetaminophen, hydrALAZINE, ondansetron (ZOFRAN)  IV    PHYSICAL EXAM: Vital signs in last 24 hours: Filed Vitals:   08/18/15 2338 08/19/15 0519 08/19/15 0806 08/19/15 1145  BP: 115/65 139/66 135/50 130/50  Pulse: 78 71  70  Temp:  98.3 F (36.8 C) 97.8 F (36.6 C) 98.1 F (36.7 C)  TempSrc:  Oral Oral Oral  Resp: Height:      Weight:  71 kg (156 lb 8.4 oz)    SpO2: 94% 94%  94%    Weight change: -2 kg (-4 lb 6.5 oz) Filed Weights   08/18/15 0341 08/18/15 0555 08/19/15 0519  Weight: 73 kg (160 lb 15 oz) 70.58 kg (155 lb 9.6 oz) 71 kg (156 lb 8.4 oz)   Body mass index is 29.59 kg/(m^2).   Gen Exam: Awake and alert with clear speech.  Neck: Supple, No JVD.   Chest: B/L Clear.   CVS: S1 S2 Regular, + syst murmurs.  Abdomen: soft, BS +, non tender, non distended. Extremities: no edema, lower extremities warm to touch. Neurologic: Non Focal.   Skin: No Rash.   Wounds: N/A.    Intake/Output from previous day:  Intake/Output Summary (Last 24 hours) at 08/19/15 1313 Last data filed at 08/19/15 0807  Gross per 24 hour  Intake 1154.85 ml  Output    250 ml  Net 904.85 ml     LAB RESULTS: CBC  Recent Labs Lab 08/18/15 0231 08/19/15 0536  WBC 14.9* 11.8*  HGB 13.5 11.8*  HCT 42.1 38.1  PLT 276 260  MCV 92.1 93.4  MCH 29.5 28.9  MCHC 32.1 31.0  RDW 14.3 14.6    Chemistries   Recent Labs Lab 08/18/15 0231 08/19/15 0536  NA 136 136  K 4.9 5.2*  CL 105 105  CO2 22 26  GLUCOSE 112* 114*  BUN 30* 25*  CREATININE 1.17* 1.18*  CALCIUM 9.4 9.4    CBG: No results for input(s): GLUCAP in the last 168 hours.  GFR Estimated Creatinine Clearance: 28.6 mL/min (by C-G formula based on Cr of 1.18).  Coagulation profile  Recent Labs Lab 08/18/15 0401  INR 1.00    Cardiac Enzymes  Recent Labs Lab 08/18/15 0231 08/18/15 0806 08/18/15 1852  TROPONINI 0.53* 2.01* 5.91*    Invalid input(s): POCBNP No results for input(s): DDIMER in the last 72 hours. No results for input(s): HGBA1C  in the last 72 hours. No results for input(s): CHOL, HDL, LDLCALC, TRIG, CHOLHDL, LDLDIRECT in the last 72 hours. No results for input(s): TSH, T4TOTAL, T3FREE, THYROIDAB in the last 72 hours.  Invalid input(s): FREET3 No results for input(s): VITAMINB12, FOLATE, FERRITIN, TIBC, IRON, RETICCTPCT in the last 72 hours. No results for input(s): LIPASE, AMYLASE in the last 72 hours.  Urine Studies No results for input(s): UHGB, CRYS in the last 72 hours.  Invalid input(s): UACOL, UAPR, USPG, UPH, UTP, UGL, UKET, UBIL, UNIT, UROB, ULEU, UEPI, UWBC, URBC, UBAC, CAST, UCOM, BILUA  MICROBIOLOGY: Recent Results (from the past 240 hour(s))  MRSA PCR Screening     Status: None   Collection Time: 08/18/15  5:49 AM  Result Value Ref Range Status   MRSA by PCR NEGATIVE NEGATIVE Final    Comment:        The GeneXpert MRSA Assay (FDA approved for NASAL specimens only), is one component of a comprehensive MRSA colonization surveillance program. It is not intended to diagnose MRSA infection nor to guide or monitor treatment for MRSA infections.     RADIOLOGY STUDIES/RESULTS: Dg Chest 2 View  08/18/2015  CLINICAL DATA:  Shortness of breath and chest pain EXAM: CHEST  2 VIEW COMPARISON:  03/05/2015 FINDINGS: Stable cardiomediastinal silhouette with cardiac size accentuated by lower mediastinal fat pad. Bandlike opacity at the right hilum consistent with scarring and stable from 04/07/2015 chest CT. There is no edema, consolidation, effusion, or pneumothorax. Chronic L1 compression fracture with advanced height loss. IMPRESSION: Stable exam.  No evidence of acute cardiopulmonary disease. Electronically Signed   By: Marnee SpringJonathon  Watts M.D.   On: 08/18/2015 03:12    Jeoffrey MassedGHIMIRE,Ahmet Schank, MD  [facility] Pager:[phone]  If 7PM-7AM, please contact night-coverage [email or web address] Password [email or web address] 08/19/2015, 1:13 PM   LOS: 1 day

## 2015-08-19 NOTE — Progress Notes (Signed)
    Subjective:  Denies CP; mild dyspnea   Objective:  Filed Vitals:   08/18/15 2230 08/18/15 2300 08/18/15 2338 08/19/15 0519  BP: 126/79 100/37 115/65 139/66  Pulse: 79 72 78 71  Temp:    98.3 F (36.8 C)  TempSrc:    Oral  Resp: 16 13 23 19   Height:      Weight:    71 kg (156 lb 8.4 oz)  SpO2: 94% 92% 94% 94%    Intake/Output from previous day:  Intake/Output Summary (Last 24 hours) at 08/19/15 0746 Last data filed at 08/19/15 0122  Gross per 24 hour  Intake 1125.85 ml  Output    650 ml  Net 475.85 ml    Physical Exam: Physical exam: Well-developed well-nourished in no acute distress.  Skin is warm and dry.  HEENT is normal.  Neck is supple.  Chest is clear to auscultation with normal expansion.  Cardiovascular exam is regular rate and rhythm.  Abdominal exam nontender or distended. No masses palpated. Right groin with no hematoma and no bruit Extremities show no edema. neuro grossly intact    Lab Results: Basic Metabolic Panel:  Recent Labs  16/07/9610/08/16 0231 08/19/15 0536  NA 136 136  K 4.9 5.2*  CL 105 105  CO2 22 26  GLUCOSE 112* 114*  BUN 30* 25*  CREATININE 1.17* 1.18*  CALCIUM 9.4 9.4   CBC:  Recent Labs  08/18/15 0231 08/19/15 0536  WBC 14.9* 11.8*  HGB 13.5 11.8*  HCT 42.1 38.1  MCV 92.1 93.4  PLT 276 260   Cardiac Enzymes:  Recent Labs  08/18/15 0231 08/18/15 0806 08/18/15 1852  TROPONINI 0.53* 2.01* 5.91*     Assessment/Plan:  1 NSTEMI-s/p PCI of distal RCA. Continue ASA, olavix, statin and metoprolol. Check echo for LV function. Transfer to telemetry. Ambulate. DC in AM if stable. 2 Acute diastolic CHF-patient complains of dyspnea and LVEDP elevated at time of cath. Lasix 20 mg PO x 1. 3 HTN-BP controlled. Continue present meds.  Olga MillersBrian Kendre Jacinto 08/19/2015, 7:46 AM

## 2015-08-19 NOTE — Telephone Encounter (Signed)
Unable to leave a message.

## 2015-08-19 NOTE — Progress Notes (Signed)
CARDIAC REHAB PHASE I   PRE:  Rate/Rhythm: 83 SR  BP:  Sitting: 135/50        SaO2: 100 RA  MODE:  Ambulation: 260 ft   POST:  Rate/Rhythm: 90 SR  BP:  Sitting: 150/63         SaO2: 96 RA  Pt ambulated 260 ft on RA, rolling walker (pt uses walker at home), handheld assist, fairly steady gait, tolerated well.  Pt c/o of DOE (not new), denies cp, dizziness, declined rest stop. Completed MI/stent education with pt daughter at bedside.  Reviewed risk factors, anti-platelet therapy, stent card, activity restrictions, ntg, exercise, heart healthy diet, and phase 2 cardiac rehab. Discussed pending echo and concern for acute heart failure post MI, will review CHF education tomorrow as appropriate. Pt verbalized understanding. Pt agrees to phase 2 cardiac rehab referral, [facility] Pt does live alone, has limited activity tolerance, pt would benefit from PT consult to maximize mobility, also [facility] follow-[facility] if possible. RN aware. Pt to chair after walk, daughter at bedside, call bell within reach. Will follow-up tomorrow.    [phone]   Joylene GrapesMonge, Stella Bortle C, RN, BSN 08/19/2015 9:44 AM

## 2015-08-19 NOTE — Telephone Encounter (Signed)
New message     TCM appt on  11.18.2016 @ 8 am with Nada BoozerLaura Ingold.   Per Vin PA

## 2015-08-19 NOTE — Consult Note (Addendum)
Referring Physician: Dr Sloan Leiter    Chief Complaint: code stroke, dysarthria, confusion  HPI:                                                                                                                                         [name] is an 81 y.o. female with a past medical history siignificant for HTN, CAD, NSTEMI, stoke without residual deficits, admitted with NSTEMI, underwent uneventful cardiac cath yesterday, and was on heparin until earlier today. She said that she awoke from a nap, got up from bed and was waking to the bathroom when her son noticed that she was " kind of confused talking about the phones that she has at the house, and was also having trouble getting her words out". By the time rapid response nurse arrived to the bedside patient was back to baseline, so the duration of this event was very short lasting, maximum 5 minutes. Denies associated HA, vertigo, double vision, focal weakness, confusion, or visual disturbances. NIHSS 0 CT brain was personally reviewed and showed no acute abnormality.   Date last known well: 08/19/15 Time last known well: 3:30 pm tPA Given: no, symptoms resolved NIHSS: 0 MRS: 0  Past Medical History  Diagnosis Date  . Hypertension   . Arthritis   . Gout, joint   . Stroke ([place])   . Anemia   . NSTEMI (non-ST elevated myocardial infarction) ([place]) 08/2015    Past Surgical History  Procedure Laterality Date  . Joint replacement  left knee  . Eye surgery  both  . Dilation and curettage of uterus    . Abdominal hysterectomy    . Hernia repair    . Breast surgery      CYST  . Cardiac catheterization N/A 08/18/2015    Procedure: Left Heart Cath and Coronary Angiography;  Surgeon: Troy Sine, MD;  Location: [place] [facility];  Service: Cardiovascular;  Laterality: N/A;  . Cardiac catheterization  08/18/2015    Procedure: Coronary Stent Intervention;  Surgeon: Troy Sine, MD;  Location: [place] [facility];  Service:  Cardiovascular;;    History reviewed. No pertinent family history. Social History:  reports that she has never smoked. She has never used smokeless tobacco. She reports that she does not drink alcohol or use illicit drugs.  Allergies:  Allergies  Allergen Reactions  . Meloxicam Hives    Medications:  Scheduled: . aspirin EC  81 mg Oral Daily  . atorvastatin  80 mg Oral q1800  . clopidogrel  75 mg Oral Q breakfast  . enoxaparin (LOVENOX) injection  40 mg Subcutaneous Q24H  . losartan  50 mg Oral Daily  . metoprolol  25 mg Oral BID    ROS:                                                                                                                                       History obtained from chart review and the patient  General ROS: negative for - chills, fatigue, fever, night sweats, weight gain or weight loss Psychological ROS: negative for - behavioral disorder, hallucinations, memory difficulties, mood swings or suicidal ideation Ophthalmic ROS: negative for - blurry vision, double vision, eye pain or loss of vision ENT ROS: negative for - epistaxis, nasal discharge, oral lesions, sore throat, tinnitus or vertigo Allergy and Immunology ROS: negative for - hives or itchy/watery eyes Hematological and Lymphatic ROS: negative for - bleeding problems, bruising or swollen lymph nodes Endocrine ROS: negative for - galactorrhea, hair pattern changes, polydipsia/polyuria or temperature intolerance Respiratory ROS: negative for - cough, hemoptysis, shortness of breath or wheezing Cardiovascular ROS: negative for - dyspnea on exertion, edema or irregular heartbeat Gastrointestinal ROS: negative for - abdominal pain, diarrhea, hematemesis, nausea/vomiting or stool incontinence Genito-Urinary ROS: negative for - dysuria, hematuria, incontinence or urinary  frequency/urgency Musculoskeletal ROS: negative for - joint swelling or muscular weakness Neurological ROS: as noted in HPI Dermatological ROS: negative for rash and skin lesion changes   Physical exam:  Constitutional: well developed, pleasant female in no apparent distress. Blood pressure 167/68, pulse 77, temperature 97.5 F (36.4 C), temperature source Oral, resp. rate 18, height _0  (1.549 m), weight 71 kg (156 lb 8.4 oz), SpO2 95 %. Eyes: no jaundice or exophthalmos.  Head: normocephalic. Neck: supple, no bruits, no JVD. Cardiac: no murmurs. Lungs: clear. Abdomen: soft, no tender, no mass. Extremities: no edema, clubbing, or cyanosis.  Skin: no rash  Neurologic Examination:                                                                                                      General: NAD Mental Status: Alert, oriented, thought content appropriate.  Speech fluent without evidence of aphasia.  Able to follow 3 step commands without difficulty. Cranial Nerves: II: Discs flat bilaterally; Visual fields grossly normal, pupils equal, round, reactive to light and accommodation III,IV, VI:  ptosis not present, extra-ocular motions intact bilaterally V,VII: smile symmetric, facial light touch sensation normal bilaterally VIII: hearing normal bilaterally IX,X: uvula rises symmetrically XI: bilateral shoulder shrug XII: midline tongue extension without atrophy or fasciculations  Motor: Right : Upper extremity   5/5    Left:     Upper extremity   5/5  Lower extremity   5/5     Lower extremity   5/5 Tone and bulk:normal tone throughout; no atrophy noted Sensory: Pinprick and light touch intact throughout, bilaterally Deep Tendon Reflexes:  2 all over Plantars: Right: downgoing   Left: downgoing Cerebellar: normal finger-to-nose,  normal heel-to-shin test Gait:  No tested due to multiple leads    Results for orders placed or performed during the hospital encounter of 08/18/15  (from the past 48 hour(s))  Basic metabolic panel     Status: Abnormal   Collection Time: 08/18/15  2:31 AM  Result Value Ref Range   Sodium 136 135 - 145 mmol/L   Potassium 4.9 3.5 - 5.1 mmol/L   Chloride 105 101 - 111 mmol/L   CO2 22 22 - 32 mmol/L   Glucose, Bld 112 (H) 65 - 99 mg/dL   BUN 30 (H) 6 - 20 mg/dL   Creatinine, Ser 1.17 (H) 0.44 - 1.00 mg/dL   Calcium 9.4 8.9 - 10.3 mg/dL   GFR calc non Af Amer 40 (L) >60 mL/min   GFR calc Af Amer 46 (L) >60 mL/min    Comment: (NOTE) The eGFR has been calculated using the CKD EPI equation. This calculation has not been validated in all clinical situations. eGFR's persistently <60 mL/min signify possible Chronic Kidney Disease.    Anion gap 9 5 - 15  CBC     Status: Abnormal   Collection Time: 08/18/15  2:31 AM  Result Value Ref Range   WBC 14.9 (H) 4.0 - 10.5 K/uL   RBC 4.57 3.87 - 5.11 MIL/uL   Hemoglobin 13.5 12.0 - 15.0 g/dL   HCT 42.1 36.0 - 46.0 %   MCV 92.1 78.0 - 100.0 fL   MCH 29.5 26.0 - 34.0 pg   MCHC 32.1 30.0 - 36.0 g/dL   RDW 14.3 11.5 - 15.5 %   Platelets 276 150 - 400 K/uL  Brain natriuretic peptide     Status: Abnormal   Collection Time: 08/18/15  2:31 AM  Result Value Ref Range   B Natriuretic Peptide 415.4 (H) 0.0 - 100.0 pg/mL  Troponin I     Status: Abnormal   Collection Time: 08/18/15  2:31 AM  Result Value Ref Range   Troponin I 0.53 (HH) <0.031 ng/mL    Comment:        POSSIBLE MYOCARDIAL ISCHEMIA. SERIAL TESTING RECOMMENDED. CRITICAL RESULT CALLED TO, READ BACK BY AND VERIFIED WITH: C SOK RN @ [phone] BY C DAVIS CALLED ON 08/18/2015   I-stat troponin, ED (not at [facility])     Status: Abnormal   Collection Time: 08/18/15  2:41 AM  Result Value Ref Range   Troponin i, poc 0.40 (HH) 0.00 - 0.08 ng/mL   Comment NOTIFIED PHYSICIAN    Comment 3            Comment: Due to the release kinetics of cTnI, a negative result within the first hours of the onset of symptoms does not rule out myocardial  infarction with certainty. If myocardial infarction is still suspected, repeat the test at appropriate intervals.   APTT     Status:  None   Collection Time: 08/18/15  4:01 AM  Result Value Ref Range   aPTT 27 24 - 37 seconds  Protime-INR     Status: None   Collection Time: 08/18/15  4:01 AM  Result Value Ref Range   Prothrombin Time 13.4 11.6 - 15.2 seconds   INR 1.00 0.00 - 1.49  MRSA PCR Screening     Status: None   Collection Time: 08/18/15  5:49 AM  Result Value Ref Range   MRSA by PCR NEGATIVE NEGATIVE    Comment:        The GeneXpert MRSA Assay (FDA approved for NASAL specimens only), is one component of a comprehensive MRSA colonization surveillance program. It is not intended to diagnose MRSA infection nor to guide or monitor treatment for MRSA infections.   Troponin I (q 6hr x 3)     Status: Abnormal   Collection Time: 08/18/15  8:06 AM  Result Value Ref Range   Troponin I 2.01 (HH) <0.031 ng/mL    Comment:        POSSIBLE MYOCARDIAL ISCHEMIA. SERIAL TESTING RECOMMENDED. CRITICAL RESULT CALLED TO, READ BACK BY AND VERIFIED WITH: J.KAAT,RN 08/18/15 0927 BY BSLADE   POCT Activated clotting time     Status: None   Collection Time: 08/18/15  3:31 PM  Result Value Ref Range   Activated Clotting Time 380 seconds  Troponin I (q 6hr x 3)     Status: Abnormal   Collection Time: 08/18/15  6:52 PM  Result Value Ref Range   Troponin I 5.91 (HH) <0.031 ng/mL    Comment:        POSSIBLE MYOCARDIAL ISCHEMIA. SERIAL TESTING RECOMMENDED. CRITICAL VALUE NOTED.  VALUE IS CONSISTENT WITH PREVIOUSLY REPORTED AND CALLED VALUE.   Basic metabolic panel     Status: Abnormal   Collection Time: 08/19/15  5:36 AM  Result Value Ref Range   Sodium 136 135 - 145 mmol/L   Potassium 5.2 (H) 3.5 - 5.1 mmol/L   Chloride 105 101 - 111 mmol/L   CO2 26 22 - 32 mmol/L   Glucose, Bld 114 (H) 65 - 99 mg/dL   BUN 25 (H) 6 - 20 mg/dL   Creatinine, Ser 1.18 (H) 0.44 - 1.00 mg/dL   Calcium  9.4 8.9 - 10.3 mg/dL   GFR calc non Af Amer 39 (L) >60 mL/min   GFR calc Af Amer 46 (L) >60 mL/min    Comment: (NOTE) The eGFR has been calculated using the CKD EPI equation. This calculation has not been validated in all clinical situations. eGFR's persistently <60 mL/min signify possible Chronic Kidney Disease.    Anion gap 5 5 - 15  CBC     Status: Abnormal   Collection Time: 08/19/15  5:36 AM  Result Value Ref Range   WBC 11.8 (H) 4.0 - 10.5 K/uL   RBC 4.08 3.87 - 5.11 MIL/uL   Hemoglobin 11.8 (L) 12.0 - 15.0 g/dL   HCT 38.1 36.0 - 46.0 %   MCV 93.4 78.0 - 100.0 fL   MCH 28.9 26.0 - 34.0 pg   MCHC 31.0 30.0 - 36.0 g/dL   RDW 14.6 11.5 - 15.5 %   Platelets 260 150 - 400 K/uL   Dg Chest 2 View  08/18/2015  CLINICAL DATA:  Shortness of breath and chest pain EXAM: CHEST  2 VIEW COMPARISON:  03/05/2015 FINDINGS: Stable cardiomediastinal silhouette with cardiac size accentuated by lower mediastinal fat pad. Bandlike opacity at the right hilum consistent  with scarring and stable from 04/07/2015 chest CT. There is no edema, consolidation, effusion, or pneumothorax. Chronic L1 compression fracture with advanced height loss. IMPRESSION: Stable exam.  No evidence of acute cardiopulmonary disease. Electronically Signed   By: Monte Fantasia M.D.   On: 08/18/2015 03:12     Assessment: 81 y.o. female with acute onset of very short lasting (5 minutes) speech impairment and confusion without concomitant lateralizing neurological signs, and unremarkable CT brain. Unsure if she sustained a TIA, a focal seizure with impairment of consciousness, or perhaps a confusional arousal. However, cardiac cath yesterday and new onset transient dysarthria warrants further imaging to ruled out cardiac procedure related infarct. Recommend MRI brain and EEG. Monitor closely tonight. Will follow up.  Stroke Risk Factors -age, HTN, CAD,  Dorian Pod ,MD [facility] [phone]  08/19/2015, 4:20  PM  ADDENDUM; MRI brain confirms acute nonhemorrhagic 7 mm infarct in the right paramedian vermis and acute nonhemorrhagic punctate infarct in the inferior left cerebellum.

## 2015-08-19 NOTE — Progress Notes (Signed)
  Echocardiogram 2D Echocardiogram has been performed.  Leta JunglingCooper, Dalinda Heidt M 08/19/2015, 11:00 AM

## 2015-08-20 ENCOUNTER — Inpatient Hospital Stay (HOSPITAL_COMMUNITY): Payer: Medicare Other

## 2015-08-20 DIAGNOSIS — N183 Chronic kidney disease, stage 3 (moderate): Secondary | ICD-10-CM

## 2015-08-20 DIAGNOSIS — G451 Carotid artery syndrome (hemispheric): Secondary | ICD-10-CM

## 2015-08-20 DIAGNOSIS — I10 Essential (primary) hypertension: Secondary | ICD-10-CM

## 2015-08-20 DIAGNOSIS — I63113 Cerebral infarction due to embolism of bilateral vertebral arteries: Secondary | ICD-10-CM | POA: Insufficient documentation

## 2015-08-20 DIAGNOSIS — I639 Cerebral infarction, unspecified: Secondary | ICD-10-CM

## 2015-08-20 DIAGNOSIS — G459 Transient cerebral ischemic attack, unspecified: Secondary | ICD-10-CM

## 2015-08-20 DIAGNOSIS — Z8673 Personal history of transient ischemic attack (TIA), and cerebral infarction without residual deficits: Secondary | ICD-10-CM

## 2015-08-20 LAB — BASIC METABOLIC PANEL
ANION GAP: 6 (ref 5–15)
BUN: 24 mg/dL — ABNORMAL HIGH (ref 6–20)
CALCIUM: 9.2 mg/dL (ref 8.9–10.3)
CO2: 25 mmol/L (ref 22–32)
Chloride: 105 mmol/L (ref 101–111)
Creatinine, Ser: 1.3 mg/dL — ABNORMAL HIGH (ref 0.44–1.00)
GFR, EST AFRICAN AMERICAN: 41 mL/min — AB (ref >60)
GFR, EST NON AFRICAN AMERICAN: 35 mL/min — AB (ref >60)
Glucose, Bld: 102 mg/dL — ABNORMAL HIGH (ref 65–99)
POTASSIUM: 4.9 mmol/L (ref 3.5–5.1)
Sodium: 136 mmol/L (ref 135–145)

## 2015-08-20 LAB — LIPID PANEL
CHOLESTEROL: 141 mg/dL (ref 0–200)
HDL: 47 mg/dL (ref >40)
LDL Cholesterol: 67 mg/dL (ref 0–99)
TRIGLYCERIDES: 136 mg/dL (ref <150)
Total CHOL/HDL Ratio: 3 RATIO
VLDL: 27 mg/dL (ref 0–40)

## 2015-08-20 LAB — CBC
HEMATOCRIT: 37.2 % (ref 36.0–46.0)
HEMOGLOBIN: 11.5 g/dL — AB (ref 12.0–15.0)
MCH: 28.7 pg (ref 26.0–34.0)
MCHC: 30.9 g/dL (ref 30.0–36.0)
MCV: 92.8 fL (ref 78.0–100.0)
Platelets: 249 10*3/uL (ref 150–400)
RBC: 4.01 MIL/uL (ref 3.87–5.11)
RDW: 14.6 % (ref 11.5–15.5)
WBC: 9.7 10*3/uL (ref 4.0–10.5)

## 2015-08-20 MED ORDER — ATORVASTATIN CALCIUM 80 MG PO TABS: 80.0000 mg | ORAL_TABLET | Freq: Every day | ORAL | Status: DC

## 2015-08-20 MED ORDER — BUTALBITAL-APAP-CAFFEINE 50-325-40 MG PO TABS
1.0000 | ORAL_TABLET | Freq: Four times a day (QID) | ORAL | Status: DC | PRN
Start: 2015-08-20 — End: 2015-11-13

## 2015-08-20 MED ORDER — BUTALBITAL-APAP-CAFFEINE 50-325-40 MG PO TABS
1.0000 | ORAL_TABLET | ORAL | Status: DC | PRN
  Administered 2015-08-20: 05:00:00 1 via ORAL
  Filled 2015-08-20: qty 1

## 2015-08-20 MED ORDER — CLOPIDOGREL BISULFATE 75 MG PO TABS: 75.0000 mg | ORAL_TABLET | Freq: Every day | ORAL | Status: DC

## 2015-08-20 MED ORDER — SODIUM CHLORIDE 0.9 % IV SOLN
INTRAVENOUS | Status: DC
  Administered 2015-08-20: 01:00:00 via INTRAVENOUS

## 2015-08-20 MED ORDER — LOSARTAN POTASSIUM 50 MG PO TABS: 50.0000 mg | ORAL_TABLET | Freq: Every day | ORAL | Status: DC

## 2015-08-20 NOTE — Progress Notes (Signed)
CARDIAC REHAB PHASE I   Went to ambulate pt, pt off the floor in EEG. Spoke with RN and MD, plan is for tentative discharge today. PT to ambulate with pt prior to d/c. MI/PCI education has been completed. Will follow-up tomorrow if pt does not discharge today.   Joylene GrapesMonge, Kenlee Vogt C, RN, BSN 08/20/2015 9:29 AM

## 2015-08-20 NOTE — Progress Notes (Signed)
EEG Completed; Results Pending  

## 2015-08-20 NOTE — Progress Notes (Signed)
Patient c/o bad headache to left side. There is a change in pupils left side dilated +4 and right side +2.  Dr. Roseanne RenoStewart notified of changes Fioricet ordered. Speech clear and no other changes. I will continue to monitor.

## 2015-08-20 NOTE — Progress Notes (Addendum)
STROKE TEAM PROGRESS NOTE   SUBJECTIVE (INTERVAL HISTORY) Her daughter is at the bedside.  Overall she feels her condition is completely resolved. She had episode of word finding difficulty lasting 5 min and resolve. This happened about 24 hour after cardiac cath. MRI showed left cerebellar and right vermis punctate infarcts but these can not explain the symptoms but more related to cardiac cath procedure.   OBJECTIVE Temp:  [97.5 F (36.4 C)-98.8 F (37.1 C)] 97.7 F (36.5 C) (11/10 0823) Pulse Rate:  [69-85] 74 (11/10 0823) Cardiac Rhythm:  [-] Normal sinus rhythm (11/10 0725) Resp:  [16-22] 18 (11/10 0823) BP: (100-173)/(38-87) 155/65 mmHg (11/10 0823) SpO2:  [89 %-98 %] 98 % (11/10 0823) Weight:  [152 lb 12.5 oz (69.3 kg)] 152 lb 12.5 oz (69.3 kg) (11/10 0408)  No results for input(s): GLUCAP in the last 168 hours.  Recent Labs Lab 08/18/15 0231 08/19/15 0536 08/20/15 0459  NA 136 136 136  K 4.9 5.2* 4.9  CL 105 105 105  CO2 22 26 25   GLUCOSE 112* 114* 102*  BUN 30* 25* 24*  CREATININE 1.17* 1.18* 1.30*  CALCIUM 9.4 9.4 9.2   No results for input(s): AST, ALT, ALKPHOS, BILITOT, PROT, ALBUMIN in the last 168 hours.  Recent Labs Lab 08/18/15 0231 08/19/15 0536 08/20/15 0459  WBC 14.9* 11.8* 9.7  HGB 13.5 11.8* 11.5*  HCT 42.1 38.1 37.2  MCV 92.1 93.4 92.8  PLT 276 260 249    Recent Labs Lab 08/18/15 0231 08/18/15 0806 08/18/15 1852  TROPONINI 0.53* 2.01* 5.91*    Recent Labs  08/18/15 0401  LABPROT 13.4  INR 1.00   No results for input(s): COLORURINE, LABSPEC, PHURINE, GLUCOSEU, HGBUR, BILIRUBINUR, KETONESUR, PROTEINUR, UROBILINOGEN, NITRITE, LEUKOCYTESUR in the last 72 hours.  Invalid input(s): APPERANCEUR     Component Value Date/Time   CHOL 141 08/20/2015 0459   TRIG 136 08/20/2015 0459   HDL 47 08/20/2015 0459   CHOLHDL 3.0 08/20/2015 0459   VLDL 27 08/20/2015 0459   LDLCALC 67 08/20/2015 0459   Lab Results  Component Value Date    HGBA1C 6.3* 09/26/2012   No results found for: LABOPIA, COCAINSCRNUR, LABBENZ, AMPHETMU, THCU, LABBARB  No results for input(s): ETH in the last 168 hours.  I have personally reviewed the radiological images below and agree with the radiology interpretations.  Dg Chest 2 View  08/18/2015  IMPRESSION: Stable exam.  No evidence of acute cardiopulmonary disease.    Ct Head Wo Contrast  08/19/2015 IMPRESSION: Atrophy with small vessel chronic ischemic changes of deep cerebral white matter. Probable old RIGHT parietal periventricular white matter infarct. No definite acute intracranial abnormalities.  08/19/2015 16:34   Mr Brain Wo Contrast  08/19/2015  IMPRESSION: 1. Acute nonhemorrhagic 7 mm infarct in the right paramedian vermis. 2. Acute nonhemorrhagic punctate infarct in the inferior left cerebellum. 3. Evolution of previously noted right basal ganglia and centrum semi of ally infarct with associated will layering degeneration. 4. Diffuse white matter disease is otherwise stable. Lacunar infarct of the left caudate head and is not acute but is new since the prior exam.   Carotid Doppler  08/05/15 - no significant stenosis bilaterally.  2D Echocardiogram  - Left ventricle: The cavity size was normal. Wall thickness was increased in a pattern of mild LVH. Systolic function was vigorous. The estimated ejection fraction was in the range of 65% to 70%. Wall motion was normal; there were no regional wall motion abnormalities. Doppler parameters are consistent with  abnormal left ventricular relaxation (grade 1 diastolic dysfunction). - Mitral valve: Calcified annulus. Mildly thickened leaflets . There was mild regurgitation. - Tricuspid valve: There was moderate regurgitation. - Pulmonary arteries: PA peak pressure: 60 mm Hg (S).  Cardiac cath  Mid RCA lesion, 25% stenosed.  Dist LAD lesion, 30% stenosed.  Post Atrio lesion, 95% stenosed. Post intervention, there is a 0%  residual stenosis.  PHYSICAL EXAM  Temp:  [97.5 F (36.4 C)-98.8 F (37.1 C)] 97.7 F (36.5 C) (11/10 0823) Pulse Rate:  [69-85] 74 (11/10 0823) Resp:  [16-22] 18 (11/10 0823) BP: (100-173)/(38-87) 155/65 mmHg (11/10 0823) SpO2:  [89 %-98 %] 98 % (11/10 0823) Weight:  [152 lb 12.5 oz (69.3 kg)] 152 lb 12.5 oz (69.3 kg) (11/10 0408)  General - Well nourished, well developed, in no apparent distress.  Ophthalmologic - Sharp disc margins OU.  Cardiovascular - Regular rate and rhythm.  Neck - supple, no carotid bruits  Mental Status -  Level of arousal and orientation to time, place, and person were intact. Language including expression, naming, repetition, comprehension was assessed and found intact. Fund of Knowledge was assessed and was intact.  Cranial Nerves II - XII - II - Visual field intact OU. III, IV, VI - Extraocular movements intact. V - Facial sensation intact bilaterally. VII - Facial movement intact bilaterally. VIII - Hearing & vestibular intact bilaterally. X - Palate elevates symmetrically. XI - Chin turning & shoulder shrug intact bilaterally. XII - Tongue protrusion intact.  Motor Strength - The patient's strength was normal in all extremities and pronator drift was absent.  Bulk was normal and fasciculations were absent.   Motor Tone - Muscle tone was assessed at the neck and appendages and was normal.  Reflexes - The patient's reflexes were symmetrical in all extremities and she had no pathological reflexes.  Sensory - Light touch, temperature/pinprick were assessed and were symmetrical.    Coordination - The patient had normal movements in the hands and feet with no ataxia or dysmetria.  Tremor was absent.  Gait and Station - deferred.   ASSESSMENT/PLAN Ms. [name] is a 81 y.o. female with history of HTN, CAD, right BG stroke without residue deficit admitted for NSTMI. Had one episode of word finding difficulty lasting 5 min. Symptoms  resolved.    Right brain TIA with transient aphasia - may related to recent NSTMI, not related to cardiac cath Punctate cerebellar infarcts:  Asymptomatic, likely related to cardiac cath procedure  MRI  Left cerebellar and right vermis punctate infarcts  Carotid Doppler  unremarkable  2D Echo  unremarkable  LDL 67  HgbA1c pending  lovenox for VTE prophylaxis  DIET DYS 3 Room service appropriate?: Yes; Fluid consistency:: Thin  Diet - low sodium heart healthy   aspirin 81 mg daily and clopidogrel 75 mg daily prior to admission, was put on ASA heparin drip for NSTMI but now on aspirin 81 mg daily and clopidogrel 75 mg daily. Continue dual antiplatelet for stroke and cardiac prevention  Patient counseled to be compliant with her antithrombotic medications  Ongoing aggressive stroke risk factor management  NSTMI  TEE EF 65-70%  Cardiac cath - Post Atrio lesion, 95% stenosed. Post intervention, there is a 0% residual stenosis.  Off heparin  On dual antiplatelet  Hypertension  Home meds:   losartan Permissive hypertension (OK if <220/120) for 24-48 hours post stroke and then gradually normalized within 5-7 days. Currently on losartan  Stable  Patient counseled to be  compliant with her blood pressure medications  Hyperlipidemia  Home meds:  zocor 20   Currently on lipitor 80  LDL 67, goal < 70  Continue statin at discharge  Other Stroke Risk Factors  Advanced age  Hx stroke/TIA  Coronary artery disease  Other Active Problems  Elevated Cre  Mild anemia  Other Pertinent History    Hospital day # 2  Neurology will sign off. Please call with questions. Pt will follow up with Dr. Roda Shutters at [facility] in about 2 months. Thanks for the consult.  Marvel Plan, MD PhD Stroke Neurology 08/20/2015 2:02 PM    To contact Stroke Continuity provider, please refer to [email or web address]. After hours, contact General Neurology

## 2015-08-20 NOTE — Discharge Summary (Signed)
PATIENT DETAILS Name: [name] Age: 81 y.o. Sex: female Date of Birth: 03-17-1923 MRN: [id number]. Admitting Physician: Edwin Dada, MD VQM:[id number],KDTOIZT, MD  Admit Date: 08/18/2015 Discharge date: 08/20/2015  Recommendations for Outpatient Follow-up:  1. Ensure follow-up with cardiology and neurology  2. A1c pending-please follow 3. Please repeat CBC/BMET at next visit 4. Echo demonstarted PA pressure of 60 mmHg-defer further work up to outpatient.  PRIMARY DISCHARGE DIAGNOSIS:  Principal Problem:   NSTEMI (non-ST elevated myocardial infarction) ([place]) Active Problems:   Hypertension   CKD (chronic kidney disease), stage III   History of stroke      PAST MEDICAL HISTORY: Past Medical History  Diagnosis Date  . Hypertension   . Arthritis   . Gout, joint   . Stroke ([place])   . Anemia   . NSTEMI (non-ST elevated myocardial infarction) ([facility] [place]) 08/2015    DISCHARGE MEDICATIONS: Current Discharge Medication List    START taking these medications   Details  atorvastatin (LIPITOR) 80 MG tablet Take 1 tablet (80 mg total) by mouth daily at 6 PM. Qty: 60 tablet, Refills: 0    butalbital-acetaminophen-caffeine (FIORICET, ESGIC) 50-325-40 MG tablet Take 1 tablet by mouth every 6 (six) hours as needed for headache. Qty: 20 tablet, Refills: 0    clopidogrel (PLAVIX) 75 MG tablet Take 1 tablet (75 mg total) by mouth daily with breakfast. Qty: 60 tablet, Refills: 0    losartan (COZAAR) 50 MG tablet Take 1 tablet (50 mg total) by mouth daily. Qty: 60 tablet, Refills: 0      CONTINUE these medications which have NOT CHANGED   Details  aspirin 81 MG chewable tablet Chew 81 mg by mouth daily.    Calcium Carbonate-Vitamin D (CALTRATE 600+D PO) Take 1 tablet by mouth 2 (two) times daily.    fluticasone (FLONASE) 50 MCG/ACT nasal spray Place 2 sprays into both nostrils as needed.     metoprolol (LOPRESSOR) 50 MG tablet Take 25 mg by mouth 2 (two) times daily.    Multiple Vitamins-Minerals (MULTIPLE VITAMINS/WOMENS PO) Take 1 tablet by mouth daily. W/vitamin D    Polyethyl Glycol-Propyl Glycol (SYSTANE OP) Apply 1 drop to eye 2 (two) times daily.    triamcinolone cream (KENALOG) 0.1 % Apply 1 application topically daily as needed. For rash      STOP taking these medications     furosemide (LASIX) 20 MG tablet      Naproxen Sodium (ALEVE PO)      simvastatin (ZOCOR) 20 MG tablet      aspirin (BAYER ASPIRIN) 325 MG tablet         ALLERGIES:   Allergies  Allergen Reactions  . Meloxicam Hives    BRIEF HPI:  See H&P, Labs, Consult and Test reports for all details in brief, patient was admitted for evaluation of chest pain and shortness of breath.  CONSULTATIONS:   cardiology and neurology  PERTINENT RADIOLOGIC STUDIES: Dg Chest 2 View  08/18/2015  CLINICAL DATA:  Shortness of breath and chest pain EXAM: CHEST  2 VIEW COMPARISON:  03/05/2015 FINDINGS: Stable cardiomediastinal silhouette with cardiac size accentuated by lower mediastinal fat pad. Bandlike opacity at the right hilum consistent with scarring and stable from 04/07/2015 chest CT. There is no edema, consolidation, effusion, or pneumothorax. Chronic L1 compression fracture with advanced height loss. IMPRESSION: Stable exam.  No evidence of acute cardiopulmonary disease. Electronically Signed   By: Monte Fantasia M.D.   On: 08/18/2015 03:12   Ct Head Wo Contrast  08/19/2015  CLINICAL DATA:  Code stroke, episode of aphasia, RIGHT-sided weakness questioned, TIA, hypertension, prior stroke, prior NSTEMI EXAM: CT HEAD WITHOUT CONTRAST TECHNIQUE: Contiguous axial images were obtained from the base of the skull through the vertex without intravenous contrast. COMPARISON:  09/21/2012 FINDINGS: Generalized atrophy. Normal ventricular morphology. No midline shift or mass effect. Small vessel chronic ischemic changes of deep cerebral white matter. More focal low attenuation in RIGHT parietal  periventricular white matter again identified likely an old infarct. No intracranial hemorrhage, mass lesion, or acute infarction. No extra-axial fluid collections. Partial opacification of LEFT mastoid air cells, new. Visualized paranasal sinuses and RIGHT mastoid air cells clear. Bones unremarkable. IMPRESSION: Atrophy with small vessel chronic ischemic changes of deep cerebral white matter. Probable old RIGHT parietal periventricular white matter infarct. No definite acute intracranial abnormalities. Findings called to Dr. Armida Sans on 08/19/2015 at 1633 hr. Electronically Signed   By: Lavonia Dana M.D.   On: 08/19/2015 16:34   Mr Brain Wo Contrast  08/19/2015  ADDENDUM REPORT: 08/19/2015 19:33 ADDENDUM: These results were called by telephone at the time of interpretation on 08/19/2015 at 7:33 pm to Dr. Nicole Kindred, who verbally acknowledged these results. Electronically Signed   By: San Morelle M.D.   On: 08/19/2015 19:33  08/19/2015  CLINICAL DATA:  Heart catheterization yesterday. Episode today of increased confusion and expressive aphasia. Symptoms have since resolved. EXAM: MRI HEAD WITHOUT CONTRAST TECHNIQUE: Multiplanar, multiecho pulse sequences of the brain and surrounding structures were obtained without intravenous contrast. COMPARISON:  CT head from the same day.  MRI brain 09/25/2012. FINDINGS: The diffusion-weighted images demonstrate acute nonhemorrhagic 7 mm infarct in the right paramedian vermis. There is a punctate area of restricted diffusion in the inferior left cerebellum as well. T2 signal associated with the right paramedian infarct. A remote infarct of the right basal ganglia and centrum semi ovale are again noted. A remote lacunar infarct is present in the left caudate head. There is wallerian degeneration along the cortical spinal right cortical spinal tracts and including the right cerebral peduncle. Atrophy and diffuse white matter changes are noted bilaterally without significant  change otherwise. Flow is present in the major intracranial arteries. Bilateral lens replacements are noted. The globes and orbits are otherwise intact. The the paranasal sinuses are clear. A chronic left mastoid effusion is present. No obstructing nasopharyngeal lesion is present. Degenerative changes are present in the upper cervical spine. Midline images are otherwise unremarkable. IMPRESSION: 1. Acute nonhemorrhagic 7 mm infarct in the right paramedian vermis. 2. Acute nonhemorrhagic punctate infarct in the inferior left cerebellum. 3. Evolution of previously noted right basal ganglia and centrum semi of ally infarct with associated will layering degeneration. 4. Diffuse white matter disease is otherwise stable. Lacunar infarct of the left caudate head and is not acute but is new since the prior exam. Electronically Signed: By: San Morelle M.D. On: 08/19/2015 19:29     PERTINENT LAB RESULTS: CBC:  Recent Labs  08/19/15 0536 08/20/15 0459  WBC 11.8* 9.7  HGB 11.8* 11.5*  HCT 38.1 37.2  PLT 260 249   CMET CMP     Component Value Date/Time   NA 136 08/20/2015 0459   K 4.9 08/20/2015 0459   CL 105 08/20/2015 0459   CO2 25 08/20/2015 0459   GLUCOSE 102* 08/20/2015 0459   BUN 24* 08/20/2015 0459   CREATININE 1.30* 08/20/2015 0459   CALCIUM 9.2 08/20/2015 0459   PROT 6.6 09/25/2012 2357   ALBUMIN 3.4* 09/25/2012 2357  AST 20 09/25/2012 2357   ALT 12 09/25/2012 2357   ALKPHOS 81 09/25/2012 2357   BILITOT 0.2* 09/25/2012 2357   GFRNONAA 35* 08/20/2015 0459   GFRAA 41* 08/20/2015 0459    GFR Estimated Creatinine Clearance: 25.6 mL/min (by C-G formula based on Cr of 1.3). No results for input(s): LIPASE, AMYLASE in the last 72 hours.  Recent Labs  08/18/15 0231 08/18/15 0806 08/18/15 1852  TROPONINI 0.53* 2.01* 5.91*   Invalid input(s): POCBNP No results for input(s): DDIMER in the last 72 hours. No results for input(s): HGBA1C in the last 72 hours.  Recent  Labs  08/20/15 0459  CHOL 141  HDL 47  LDLCALC 67  TRIG 136  CHOLHDL 3.0   No results for input(s): TSH, T4TOTAL, T3FREE, THYROIDAB in the last 72 hours.  Invalid input(s): FREET3 No results for input(s): VITAMINB12, FOLATE, FERRITIN, TIBC, IRON, RETICCTPCT in the last 72 hours. Coags:  Recent Labs  08/18/15 0401  INR 1.00   Microbiology: Recent Results (from the past 240 hour(s))  MRSA PCR Screening     Status: None   Collection Time: 08/18/15  5:49 AM  Result Value Ref Range Status   MRSA by PCR NEGATIVE NEGATIVE Final    Comment:        The GeneXpert MRSA Assay (FDA approved for NASAL specimens only), is one component of a comprehensive MRSA colonization surveillance program. It is not intended to diagnose MRSA infection nor to guide or monitor treatment for MRSA infections.      BRIEF HOSPITAL COURSE:  NSTEMI (non-ST elevated myocardial infarction):No further chest pain, initially managed with  IV Heparin, IV NTG gtt,ASA, Stain and Beta Blocker-subsequently seen by cardiology-underwent LHC-now s/p PCI of distal RCA-recommendations are to continue with aspirin, Plavix, statin and metoprolol. Echocardiogram showed EF of 65-70%. No further recommendations from cardiology  Active Problems: Acute CVA: On 11/9-patient developed transient dysarthria and very mild right upper extremity weakness. MRI confirmed CVA. Etiology likely atheroembolic following LHC-MRI brain demonstrated acute CVA in the right paramedian vermis and in the left inferior cerebellum. Echocardiogram without any embolic foci, recent outpatient carotid Doppler did not show any significant stenosis. LDL 67, A1c pending-spoke with Dr. Newman Nickels further workup required-okay to discharge. Seen by SLP-recommendations are for dysphagia 3 diet-family/patient aware of aspiration risk, seen by PT-recommendations are for home health services.  Acute diastolic heart failure: Minimal volume overload- Await  echocardiogram, Lasix 1 today. Follow.  Pul YJE:HUDJ demonstarted PA pressure of 60 mmHg-defer further work up to outpatient.  Hypertension: Controlled-continue metoprolol and losartan. Follow closely in the outpatient setting  CKD (chronic kidney disease), stage SHF:WYOVZCHYIF close to usual baseline, follow closely-suggest repeat be met sometime next week.  Prior History of stroke:very minimal residual right sided weakness-continue ASA/Plavix/Statin-see above  TODAY-DAY OF DISCHARGE:  Subjective:   [name] today has no headache,no chest abdominal pain,no new weakness tingling or numbness, feels much better wants to go home today.   Objective:   Blood pressure 155/65, pulse 74, temperature 97.7 F (36.5 C), temperature source Oral, resp. rate 18, height '5\' 1"'  (1.549 m), weight 69.3 kg (152 lb 12.5 oz), SpO2 98 %.  Intake/Output Summary (Last 24 hours) at 08/20/15 1201 Last data filed at 08/19/15 1300  Gross per 24 hour  Intake    240 ml  Output    300 ml  Net    -60 ml   Filed Weights   08/18/15 0555 08/19/15 0519 08/20/15 0408  Weight: 70.58 kg (155 lb  9.6 oz) 71 kg (156 lb 8.4 oz) 69.3 kg (152 lb 12.5 oz)    Exam Awake Alert, Oriented *3, No new F.N deficits, Normal affect [place].AT,PERRAL Supple Neck,No JVD, No cervical lymphadenopathy appriciated.  Symmetrical Chest wall movement, Good air movement bilaterally, CTAB RRR,No Gallops,Rubs or new Murmurs, No Parasternal Heave +ve B.Sounds, Abd Soft, Non tender, No organomegaly appriciated, No rebound -guarding or rigidity. No Cyanosis, Clubbing or edema, No new Rash or bruise  DISCHARGE CONDITION: Stable  DISPOSITION: Home with home health services  DISCHARGE INSTRUCTIONS:    Activity:  As tolerated with Full fall precautions use walker/cane & assistance as needed  Get Medicines reviewed and adjusted: Please take all your medications with you for your next visit with your Primary MD  Please request your  Primary MD to go over all hospital tests and procedure/radiological results at the follow up, please ask your Primary MD to get all Hospital records sent to his/her office.  If you experience worsening of your admission symptoms, develop shortness of breath, life threatening emergency, suicidal or homicidal thoughts you must seek medical attention immediately by calling 911 or calling your MD immediately  if symptoms less severe.  You must read complete instructions/literature along with all the possible adverse reactions/side effects for all the Medicines you take and that have been prescribed to you. Take any new Medicines after you have completely understood and accpet all the possible adverse reactions/side effects.   Do not drive when taking Pain medications.   Do not take more than prescribed Pain, Sleep and Anxiety Medications  Special Instructions: If you have smoked or chewed Tobacco  in the last 2 yrs please stop smoking, stop any regular Alcohol  and or any Recreational drug use.  Wear Seat belts while driving.  Please note  You were cared for by a hospitalist during your hospital stay. Once you are discharged, your primary care physician will handle any further medical issues. Please note that NO REFILLS for any discharge medications will be authorized once you are discharged, as it is imperative that you return to your primary care physician (or establish a relationship with a primary care physician if you do not have one) for your aftercare needs so that they can reassess your need for medications and monitor your lab values.   Diet recommendation: Heart Healthy diet Dysphagia 3 diet Fluid restriction 1.5 lit/day   Discharge Instructions    (HEART FAILURE PATIENTS) Call MD:  Anytime you have any of the following symptoms: 1) 3 pound weight gain in 24 hours or 5 pounds in 1 week 2) shortness of breath, with or without a dry hacking cough 3) swelling in the hands, feet or  stomach 4) if you have to sleep on extra pillows at night in order to breathe.    Complete by:  As directed      Amb Referral to Cardiac Rehabilitation    Complete by:  As directed   Diagnosis:   Myocardial Infarction PCI       Call MD for:  persistant dizziness or light-headedness    Complete by:  As directed      Call MD for:  redness, tenderness, or signs of infection (pain, swelling, redness, odor or green/yellow discharge around incision site)    Complete by:  As directed      Diet - low sodium heart healthy    Complete by:  As directed   Dysphagia 3 diet, Fluid Restriction 1.5 Litres/day  Increase activity slowly    Complete by:  As directed            Follow-up Information    Follow up with [facility] R, NP On 08/28/2015.   Specialties:  Cardiology, Radiology   Why:  '@8' :00 am for TCM appoinment   Contact information:   [address] [place] [address] [phone]       Follow up with [facility].   Why:  Home Health RN arranged   Contact information:   [place] [address] [phone]      Total Time spent on discharge equals  45 minutes.  SignedOren Binet 08/20/2015 12:01 PM

## 2015-08-20 NOTE — Evaluation (Signed)
Physical Therapy Evaluation Patient Details Name: [name] MRN: [id number] DOB: 1923-04-20 Today's Date: 08/20/2015   History of Present Illness  81 yo female with onset of NSTEMI now hs CHF and elevated troponin, CKD 3  Clinical Impression  Pt is able to be assisted by daughter with limited help and should be safe with her help.  Pt can walk with supervision but will need help to manage her house in addition.  Will anticipate her discharge today and have planned follow up therapy.    Follow Up Recommendations Home health PT;Supervision/Assistance - 24 hour    Equipment Recommendations  None recommended by PT    Recommendations for Other Services       Precautions / Restrictions Precautions Precautions: Fall (telemetry) Restrictions Weight Bearing Restrictions: No      Mobility  Bed Mobility               General bed mobility comments: up when PT arrived  Transfers Overall transfer level: Needs assistance Equipment used: Rolling walker (2 wheeled);1 person hand held assist Transfers: Sit to/from [facility] Sit to Stand: Min guard Stand pivot transfers: Min guard       General transfer comment: cues for hand placement  Ambulation/Gait Ambulation/Gait assistance: Min guard Ambulation Distance (Feet): 250 Feet Assistive device: Rolling walker (2 wheeled);1 person hand held assist Gait Pattern/deviations: Step-through pattern;Wide base of support;Trunk flexed Gait velocity: regular Gait velocity interpretation: at or above normal speed for age/gender General Gait Details: has fairly stable gait considering her medical issues  Stairs            Wheelchair Mobility    Modified Rankin (Stroke Patients Only)       Balance Overall balance assessment: No apparent balance deficits (not formally assessed)                                           Pertinent Vitals/Pain Pain Assessment: No/denies pain    Home  Living Family/patient expects to be discharged to:: Private residence Living Arrangements: Alone Available Help at Discharge: Family;Available 24 hours/day Type of Home: House Home Access: Stairs to enter Entrance Stairs-Rails: None Entrance Stairs-Number of Steps: 2 Home Layout: One level Home Equipment: Walker - 2 wheels      Prior Function Level of Independence: Independent with assistive device(s)               Hand Dominance        Extremity/Trunk Assessment   Upper Extremity Assessment: Overall WFL for tasks assessed           Lower Extremity Assessment: Overall WFL for tasks assessed      Cervical / Trunk Assessment: Kyphotic  Communication   Communication: No difficulties;HOH  Cognition Arousal/Alertness: Awake/alert Behavior During Therapy: WFL for tasks assessed/performed Overall Cognitive Status: Within Functional Limits for tasks assessed                      General Comments General comments (skin integrity, edema, etc.): Pt is having a slightly decreased function considering her very good PLOF but is capable of walking with nearby supervision to min guard    Exercises        Assessment/Plan    PT Assessment Patient needs continued PT services;All further PT needs can be met in the next venue of care  PT Diagnosis Abnormality of gait  PT Problem List Decreased strength;Decreased range of motion;Decreased activity tolerance;Decreased balance;Decreased mobility;Decreased coordination;Decreased safety awareness;Cardiopulmonary status limiting activity;Obesity  PT Treatment Interventions     PT Goals (Current goals can be found in the Care Plan section) Acute Rehab PT Goals Patient Stated Goal: to go home PT Goal Formulation: With patient Time For Goal Achievement: 08/22/15 Potential to Achieve Goals: Good    Frequency Other (Comment)   Barriers to discharge Other (comment) (will have family initially to help her)       Co-evaluation               End of Session Equipment Utilized During Treatment: Gait belt Activity Tolerance: Patient tolerated treatment well Patient left: in chair;with call bell/phone within reach;with family/visitor present Nurse Communication: Mobility status         Time: 8280-0349 PT Time Calculation (min) (ACUTE ONLY): 23 min   Charges:   PT Evaluation $Initial PT Evaluation Tier I: 1 Procedure PT Treatments $Gait Training: 8-22 mins   PT G CodesRamond Dial 08-31-15, 12:49 PM   Mee Hives, PT MS Acute Rehab Dept. Number: [facility] [phone] and [place] [phone]

## 2015-08-20 NOTE — Evaluation (Signed)
Clinical/Bedside Swallow Evaluation Patient Details  Name: [name] MRN: [id number] Date of Birth: 01-15-1923  Today's Date: 08/20/2015 Time: SLP Start Time (ACUTE ONLY): 1109 SLP Stop Time (ACUTE ONLY): 1126 SLP Time Calculation (min) (ACUTE ONLY): 17 min  Past Medical History:  Past Medical History  Diagnosis Date  . Hypertension   . Arthritis   . Gout, joint   . Stroke ([facility])   . Anemia   . NSTEMI (non-ST elevated myocardial infarction) ([facility]) 08/2015   Past Surgical History:  Past Surgical History  Procedure Laterality Date  . Joint replacement  left knee  . Eye surgery  both  . Dilation and curettage of uterus    . Abdominal hysterectomy    . Hernia repair    . Breast surgery      CYST  . Cardiac catheterization N/A 08/18/2015    Procedure: Left Heart Cath and Coronary Angiography;  Surgeon: Lennette Bihari, MD;  Location[facility]AS[facility] CV LAB;  Service: Cardiovascular;  Laterality: N/A;  . Cardiac catheterization  08/18/2015    Procedure: Coronary Stent Intervention;  Surgeon: Lennette Bihari, MD;  Location: [facility] INVASIVE CV LAB;  Service: Cardiovascular;;   HPI:  [name] is a 81 y.o. female with a past medical history significant for HTN, CAD, NSTEMI, stoke without residual deficits, admitted with NSTEMI, underwent uneventful cardiac cath 11/08. Code stroke called 11/09 with symptoms resolving, although MRI shows acute infarcts in the right paramedian vermis and the inferior left cerebellum.   Assessment / Plan / Recommendation Clinical Impression  Pt's oropharyngeal swallow appears to be at her baseline, which includes chronic mild risk with thin liquids. Occasional throat clearing is noted with thin liquids via straw sips, although not when consumed by cup. Solids were within gross functional limits during this assessment, although the pt and her family report difficulty with some regular textures at home (lettuce, meats). Will initiate Dys 3 diet and thin liquids, no  straws. Given that she does not appear to have acute changes to her swallowing function, SLP to sign off.    Aspiration Risk  Mild aspiration risk    Diet Recommendation  Dys 3 diet, thin liquids Liquids by cup, no straw   Medication Administration: Whole meds with liquid    Other  Recommendations Oral Care Recommendations: Oral care BID   Follow up Recommendations  None        Swallow Study   General HPI: [name] is a 81 y.o. female with a past medical history significant for HTN, CAD, NSTEMI, stoke without residual deficits, admitted with NSTEMI, underwent uneventful cardiac cath 11/08. Code stroke called 11/09 with symptoms resolving, although MRI shows acute infarcts in the right paramedian vermis and the inferior left cerebellum. Type of Study: Bedside Swallow Evaluation Previous Swallow Assessment: previous BSE wtih prior stroke recommending regular diet and thin liquids with no straws Diet Prior to this Study: NPO Temperature Spikes Noted: No Respiratory Status: Room air History of Recent Intubation: No Behavior/Cognition: Alert;Cooperative;Pleasant mood Oral Cavity Assessment: Within Functional Limits Oral Care Completed by SLP: No Oral Cavity - Dentition: Adequate natural dentition Vision: Functional for self-feeding Self-Feeding Abilities: Able to feed self Patient Positioning: Upright in chair Baseline Vocal Quality: Normal    Oral/Motor/Sensory Function Overall Oral Motor/Sensory Function: Within functional limits   Ice Chips Ice chips: Not tested   Thin Liquid Thin Liquid: Impaired Presentation: Self Fed;Cup;Straw Pharyngeal  Phase Impairments: Suspected delayed Swallow;Throat Clearing - Immediate    Nectar  Thick Nectar Thick Liquid: Not tested   Honey Thick Honey Thick Liquid: Not tested   Puree Puree: Within functional limits Presentation: Self Fed;Spoon   Solid Solid: Within functional limits Presentation: Self Fed      [name], M.A.  CCC-SLP [phone]  [name], [name] 08/20/2015,12:25 PM

## 2015-08-20 NOTE — Progress Notes (Signed)
Notified Dr.Stewart with neuro about patient having slurred speech @0035 . No other complications present patient is oriented answering questions appropriate but with very slurred speech. Patient denies any pain or any complaints. Vitals are stable but some difference on SBP previously 160-170 down to 110-117. MD request IVF to be started. On the monitor NSR.  Primary MD also notified to be updated and also discussed code status of DNR upon arrival to hospital bracelet on wrist then  went to procedure as full code and was not changed back to DNR. Order now received for DNR. Slurred speech resolved about 45 min later.  I will continue to monitor patient.

## 2015-08-20 NOTE — Procedures (Signed)
ELECTROENCEPHALOGRAM REPORT   Patient: [name]       Room #: [id number] EEG No. ID: [id number] Age: 81 y.o.        Sex: female Referring Physician: Ghimire Report Date:  08/20/2015        Interpreting Physician: Thana FarrEYNOLDS, Cian Costanzo  History: Paula LeekMary C Zelaya is an 81 y.o. female with acute onset of slurred speech that resolved  Medications:  Scheduled: . aspirin EC  81 mg Oral Daily  . atorvastatin  80 mg Oral q1800  . clopidogrel  75 mg Oral Q breakfast  . enoxaparin (LOVENOX) injection  30 mg Subcutaneous Q24H  . losartan  50 mg Oral Daily  . metoprolol  25 mg Oral BID    Conditions of Recording:  This is a 16 channel EEG carried out with the patient in the awake and drowsy states.  Description:  The waking background activity consists of a low voltage, symmetrical, fairly well organized, 10 Hz alpha activity, seen from the parieto-occipital and posterior temporal regions.  Low voltage fast activity, poorly organized, is seen anteriorly and is at times superimposed on more posterior regions.  A mixture of theta and alpha rhythms are seen from the central and temporal regions. The patient drowses with slowing to irregular, low voltage theta and beta activity.   Stage II sleep is not obtained. No epileptiform is noted. Hyperventilation and intermittent photic stimulation were not performed.   IMPRESSION: Normal electroencephalogram, awake and drowsy.  No activation procedures were performed. There are no focal lateralizing or epileptiform features.   Thana FarrLeslie Diar Berkel, MD [facility] [phone] 08/20/2015, 12:17 PM

## 2015-08-20 NOTE — Progress Notes (Signed)
Patient Name: [name] Date of Encounter: 08/20/2015   SUBJECTIVE  The patient was seen by neurology yesterday due to speech difficulty and confusion. Now speech back to normal. Right wrist mildly tender. Denies cp or sob.   CURRENT MEDS . aspirin EC  81 mg Oral Daily  . atorvastatin  80 mg Oral q1800  . clopidogrel  75 mg Oral Q breakfast  . enoxaparin (LOVENOX) injection  30 mg Subcutaneous Q24H  . losartan  50 mg Oral Daily  . metoprolol  25 mg Oral BID    OBJECTIVE  Filed Vitals:   08/20/15 0130 08/20/15 0408 08/20/15 0520 08/20/15 0730  BP: 158/73 107/44 171/77 141/62  Pulse: 69 83    Temp:  97.6 F (36.4 C)    TempSrc:  Oral    Resp:  16  20  Height:      Weight:  152 lb 12.5 oz (69.3 kg)    SpO2: 89% 97%  97%    Intake/Output Summary (Last 24 hours) at 08/20/15 0804 Last data filed at 08/19/15 1300  Gross per 24 hour  Intake    480 ml  Output    500 ml  Net    -20 ml   Filed Weights   08/18/15 0555 08/19/15 0519 08/20/15 0408  Weight: 155 lb 9.6 oz (70.58 kg) 156 lb 8.4 oz (71 kg) 152 lb 12.5 oz (69.3 kg)    PHYSICAL EXAM  General: Pleasant, NAD. Neuro: Alert and oriented X 3. Moves all extremities spontaneously. Psych: Normal affect. HEENT:  Normal  Neck: Supple without bruits or JVD. Lungs:  Resp regular and unlabored, CTA. Heart: RRR no s3, s4. Systolic  murmurs. Abdomen: Soft, non-tender, non-distended, BS + x 4.  Extremities: No clubbing, cyanosis or edema. DP 1+ and equal bilaterally. Right wrist with mild bruise and TTP and looks swallon. R groin cath site without hematoma.   Accessory Clinical Findings  CBC  Recent Labs  08/19/15 0536 08/20/15 0459  WBC 11.8* 9.7  HGB 11.8* 11.5*  HCT 38.1 37.2  MCV 93.4 92.8  PLT 260 249   Basic Metabolic Panel  Recent Labs  08/19/15 0536 08/20/15 0459  NA 136 136  K 5.2* 4.9  CL 105 105  CO2 26 25  GLUCOSE 114* 102*  BUN 25* 24*  CREATININE 1.18* 1.30*  CALCIUM 9.4 9.2    Cardiac Enzymes  Recent Labs  08/18/15 0231 08/18/15 0806 08/18/15 1852  TROPONINI 0.53* 2.01* 5.91*   Fasting Lipid Panel  Recent Labs  08/20/15 0459  CHOL 141  HDL 47  LDLCALC 67  TRIG 136  CHOLHDL 3.0   TELE  NSR  Echo 86-5/78 LV EF: 65% -  70%  ------------------------------------------------------------------- Indications:   Dyspnea 786.09.  ------------------------------------------------------------------- History:  PMH: Shortness of Breath. Stroke. Risk factors: Hypertension.  ------------------------------------------------------------------- Study Conclusions  - Left ventricle: The cavity size was normal. Wall thickness was increased in a pattern of mild LVH. Systolic function was vigorous. The estimated ejection fraction was in the range of 65% to 70%. Wall motion was normal; there were no regional wall motion abnormalities. Doppler parameters are consistent with abnormal left ventricular relaxation (grade 1 diastolic dysfunction). - Mitral valve: Calcified annulus. Mildly thickened leaflets . There was mild regurgitation. - Tricuspid valve: There was moderate regurgitation. - Pulmonary arteries: PA peak pressure: 60 mm Hg (S).  Radiology/Studies  Dg Chest 2 View  08/18/2015  CLINICAL DATA:  Shortness of breath and chest pain EXAM: CHEST  2 VIEW COMPARISON:  03/05/2015 FINDINGS: Stable cardiomediastinal silhouette with cardiac size accentuated by lower mediastinal fat pad. Bandlike opacity at the right hilum consistent with scarring and stable from 04/07/2015 chest CT. There is no edema, consolidation, effusion, or pneumothorax. Chronic L1 compression fracture with advanced height loss. IMPRESSION: Stable exam.  No evidence of acute cardiopulmonary disease. Electronically Signed   By: Marnee SpringJonathon  Watts M.D.   On: 08/18/2015 03:12   Ct Head Wo Contrast  08/19/2015  CLINICAL DATA:  Code stroke, episode of aphasia, RIGHT-sided  weakness questioned, TIA, hypertension, prior stroke, prior NSTEMI EXAM: CT HEAD WITHOUT CONTRAST TECHNIQUE: Contiguous axial images were obtained from the base of the skull through the vertex without intravenous contrast. COMPARISON:  09/21/2012 FINDINGS: Generalized atrophy. Normal ventricular morphology. No midline shift or mass effect. Small vessel chronic ischemic changes of deep cerebral white matter. More focal low attenuation in RIGHT parietal periventricular white matter again identified likely an old infarct. No intracranial hemorrhage, mass lesion, or acute infarction. No extra-axial fluid collections. Partial opacification of LEFT mastoid air cells, new. Visualized paranasal sinuses and RIGHT mastoid air cells clear. Bones unremarkable. IMPRESSION: Atrophy with small vessel chronic ischemic changes of deep cerebral white matter. Probable old RIGHT parietal periventricular white matter infarct. No definite acute intracranial abnormalities. Findings called to Dr. Leroy Kennedyamilo on 08/19/2015 at 1633 hr. Electronically Signed   By: Ulyses SouthwardMark  Boles M.D.   On: 08/19/2015 16:34   Mr Brain Wo Contrast  08/19/2015  ADDENDUM REPORT: 08/19/2015 19:33 ADDENDUM: These results were called by telephone at the time of interpretation on 08/19/2015 at 7:33 pm to Dr. Roseanne RenoSTEWART, who verbally acknowledged these results. Electronically Signed   By: Marin Robertshristopher  Mattern M.D.   On: 08/19/2015 19:33  08/19/2015  CLINICAL DATA:  Heart catheterization yesterday. Episode today of increased confusion and expressive aphasia. Symptoms have since resolved. EXAM: MRI HEAD WITHOUT CONTRAST TECHNIQUE: Multiplanar, multiecho pulse sequences of the brain and surrounding structures were obtained without intravenous contrast. COMPARISON:  CT head from the same day.  MRI brain 09/25/2012. FINDINGS: The diffusion-weighted images demonstrate acute nonhemorrhagic 7 mm infarct in the right paramedian vermis. There is a punctate area of restricted diffusion  in the inferior left cerebellum as well. T2 signal associated with the right paramedian infarct. A remote infarct of the right basal ganglia and centrum semi ovale are again noted. A remote lacunar infarct is present in the left caudate head. There is wallerian degeneration along the cortical spinal right cortical spinal tracts and including the right cerebral peduncle. Atrophy and diffuse white matter changes are noted bilaterally without significant change otherwise. Flow is present in the major intracranial arteries. Bilateral lens replacements are noted. The globes and orbits are otherwise intact. The the paranasal sinuses are clear. A chronic left mastoid effusion is present. No obstructing nasopharyngeal lesion is present. Degenerative changes are present in the upper cervical spine. Midline images are otherwise unremarkable. IMPRESSION: 1. Acute nonhemorrhagic 7 mm infarct in the right paramedian vermis. 2. Acute nonhemorrhagic punctate infarct in the inferior left cerebellum. 3. Evolution of previously noted right basal ganglia and centrum semi of ally infarct with associated will layering degeneration. 4. Diffuse white matter disease is otherwise stable. Lacunar infarct of the left caudate head and is not acute but is new since the prior exam. Electronically Signed: By: Marin Robertshristopher  Mattern M.D. On: 08/19/2015 19:29    ASSESSMENT AND PLAN  1. NSTEMI (non-ST elevated myocardial infarction) (HCC) - S/p PCI of to the distal RCA treated  with PTCA and insertion of a 2.7516 mm Synergy DES stent postdilated to 2.90 mm with a 95% stenosis being reduced to 0%. Mild elevation of left ventricular end-diastolic pressure at 21 mm. - Echo showed LV EF of 65-70%, mild LVH, grade 1 DD, mild MR with sclerosis, moderate TR, PA peak pressure: 60 mm Hg (S). - Continue ASA, plavix, statin, losartan and metoprolol  2 Acute diastolic CHF -patient complained of dyspnea and LVEDP elevated at time of cath. Lasix 20 mg PO  x 1 yesterday - Now breathing improved.   3 HTN -BP elevated at times.  Continue present meds.  4. CKD (chronic kidney disease), stage III - Creatinine worsen further to 1.3 post cath. Consider holding ARB.   5. Stroke - The patient has hx of stroke - The patient developed speech impairment and confusion post PCI. Neurology following. Appreciate recommendation.  - CT of brain unremarkable. MRI of brain showed acute non hemorrhagic infract. Management per neurology.   6. Right wrist bruise - Seems from old IV site vs phlebectomy. Will continue to monitor. Placed warm compressor.   Signed, Bhagat,Bhavinkumar PA-C Pager [phone]  As above, patient seen and examined; events noted; patient had small CVA likely related to cath but no residual defects this AM; no chest pain. Continue ASA, plavix, statin, lopressor and cozaar. Would check BMET Monday as Cr mildly increased. FU with me for cardiac issues 2-4 weeks following DC. Other issues per primary care. Please call with questions. Olga Millers

## 2015-08-20 NOTE — Progress Notes (Signed)
PT Cancellation Note  Patient Details Name: Casimer LeekMary C Hedeen MRN: [id number] DOB: 01/20/1923   Cancelled Treatment:    Reason Eval/Treat Not Completed: Patient at procedure or test/unavailable.  Try later as time allows.   Ivar DrapeStout, Jas Betten E 08/20/2015, 7:55 AM   Samul Dadauth Jaleia Hanke, PT MS Acute Rehab Dept. Number: [facility] [phone] and [facility] [phone]

## 2015-08-21 DIAGNOSIS — I222 Subsequent non-ST elevation (NSTEMI) myocardial infarction: Secondary | ICD-10-CM | POA: Diagnosis not present

## 2015-08-21 DIAGNOSIS — I5031 Acute diastolic (congestive) heart failure: Secondary | ICD-10-CM | POA: Diagnosis not present

## 2015-08-21 DIAGNOSIS — N183 Chronic kidney disease, stage 3 (moderate): Secondary | ICD-10-CM | POA: Diagnosis not present

## 2015-08-21 DIAGNOSIS — Z7901 Long term (current) use of anticoagulants: Secondary | ICD-10-CM | POA: Diagnosis not present

## 2015-08-21 DIAGNOSIS — I69351 Hemiplegia and hemiparesis following cerebral infarction affecting right dominant side: Secondary | ICD-10-CM | POA: Diagnosis not present

## 2015-08-21 DIAGNOSIS — I69322 Dysarthria following cerebral infarction: Secondary | ICD-10-CM | POA: Diagnosis not present

## 2015-08-21 DIAGNOSIS — D649 Anemia, unspecified: Secondary | ICD-10-CM | POA: Diagnosis not present

## 2015-08-21 DIAGNOSIS — M1990 Unspecified osteoarthritis, unspecified site: Secondary | ICD-10-CM | POA: Diagnosis not present

## 2015-08-21 DIAGNOSIS — I129 Hypertensive chronic kidney disease with stage 1 through stage 4 chronic kidney disease, or unspecified chronic kidney disease: Secondary | ICD-10-CM | POA: Diagnosis not present

## 2015-08-21 LAB — HEMOGLOBIN A1C
HEMOGLOBIN A1C: 5.9 % — AB (ref 4.8–5.6)
MEAN PLASMA GLUCOSE: 123 mg/dL

## 2015-08-21 NOTE — Telephone Encounter (Signed)
No way to leave message - on home phone

## 2015-08-25 ENCOUNTER — Telehealth: Payer: Self-pay | Admitting: *Deleted

## 2015-08-25 NOTE — Telephone Encounter (Signed)
Pt was recently discharged from the hosp with a referral for home health services. ? If dr Jens Somcrenshaw would like to be the attending. Per dr Jens Somcrenshaw, would like PCP top be the attending. Paperwork with pts PCP dr Lieutenant Diegowilliam mckenzie faxed.

## 2015-08-26 DIAGNOSIS — N183 Chronic kidney disease, stage 3 (moderate): Secondary | ICD-10-CM | POA: Diagnosis not present

## 2015-08-26 DIAGNOSIS — I222 Subsequent non-ST elevation (NSTEMI) myocardial infarction: Secondary | ICD-10-CM | POA: Diagnosis not present

## 2015-08-26 DIAGNOSIS — M1990 Unspecified osteoarthritis, unspecified site: Secondary | ICD-10-CM | POA: Diagnosis not present

## 2015-08-26 DIAGNOSIS — D649 Anemia, unspecified: Secondary | ICD-10-CM | POA: Diagnosis not present

## 2015-08-26 DIAGNOSIS — I129 Hypertensive chronic kidney disease with stage 1 through stage 4 chronic kidney disease, or unspecified chronic kidney disease: Secondary | ICD-10-CM | POA: Diagnosis not present

## 2015-08-26 DIAGNOSIS — I5031 Acute diastolic (congestive) heart failure: Secondary | ICD-10-CM | POA: Diagnosis not present

## 2015-08-26 DIAGNOSIS — I69322 Dysarthria following cerebral infarction: Secondary | ICD-10-CM | POA: Diagnosis not present

## 2015-08-26 DIAGNOSIS — I69351 Hemiplegia and hemiparesis following cerebral infarction affecting right dominant side: Secondary | ICD-10-CM | POA: Diagnosis not present

## 2015-08-26 DIAGNOSIS — Z7901 Long term (current) use of anticoagulants: Secondary | ICD-10-CM | POA: Diagnosis not present

## 2015-08-27 DIAGNOSIS — I214 Non-ST elevation (NSTEMI) myocardial infarction: Secondary | ICD-10-CM | POA: Diagnosis not present

## 2015-08-28 ENCOUNTER — Encounter: Payer: Self-pay | Admitting: Cardiology

## 2015-08-28 ENCOUNTER — Telehealth: Payer: Self-pay | Admitting: Cardiology

## 2015-08-28 ENCOUNTER — Ambulatory Visit (INDEPENDENT_AMBULATORY_CARE_PROVIDER_SITE_OTHER): Payer: Medicare Other | Admitting: Cardiology

## 2015-08-28 VITALS — BP 126/64 | HR 62 | Ht 61.0 in | Wt 153.0 lb

## 2015-08-28 DIAGNOSIS — I10 Essential (primary) hypertension: Secondary | ICD-10-CM

## 2015-08-28 DIAGNOSIS — I69351 Hemiplegia and hemiparesis following cerebral infarction affecting right dominant side: Secondary | ICD-10-CM | POA: Diagnosis not present

## 2015-08-28 DIAGNOSIS — E875 Hyperkalemia: Secondary | ICD-10-CM

## 2015-08-28 DIAGNOSIS — Z8673 Personal history of transient ischemic attack (TIA), and cerebral infarction without residual deficits: Secondary | ICD-10-CM | POA: Diagnosis not present

## 2015-08-28 DIAGNOSIS — Z7901 Long term (current) use of anticoagulants: Secondary | ICD-10-CM | POA: Diagnosis not present

## 2015-08-28 DIAGNOSIS — M1990 Unspecified osteoarthritis, unspecified site: Secondary | ICD-10-CM | POA: Diagnosis not present

## 2015-08-28 DIAGNOSIS — I252 Old myocardial infarction: Secondary | ICD-10-CM | POA: Diagnosis not present

## 2015-08-28 DIAGNOSIS — I5031 Acute diastolic (congestive) heart failure: Secondary | ICD-10-CM | POA: Diagnosis not present

## 2015-08-28 DIAGNOSIS — N183 Chronic kidney disease, stage 3 (moderate): Secondary | ICD-10-CM | POA: Diagnosis not present

## 2015-08-28 DIAGNOSIS — R21 Rash and other nonspecific skin eruption: Secondary | ICD-10-CM

## 2015-08-28 DIAGNOSIS — I69322 Dysarthria following cerebral infarction: Secondary | ICD-10-CM | POA: Diagnosis not present

## 2015-08-28 DIAGNOSIS — I222 Subsequent non-ST elevation (NSTEMI) myocardial infarction: Secondary | ICD-10-CM | POA: Diagnosis not present

## 2015-08-28 DIAGNOSIS — D649 Anemia, unspecified: Secondary | ICD-10-CM | POA: Diagnosis not present

## 2015-08-28 DIAGNOSIS — I2510 Atherosclerotic heart disease of native coronary artery without angina pectoris: Secondary | ICD-10-CM

## 2015-08-28 DIAGNOSIS — I639 Cerebral infarction, unspecified: Secondary | ICD-10-CM

## 2015-08-28 DIAGNOSIS — I129 Hypertensive chronic kidney disease with stage 1 through stage 4 chronic kidney disease, or unspecified chronic kidney disease: Secondary | ICD-10-CM | POA: Diagnosis not present

## 2015-08-28 MED ORDER — TRIAMCINOLONE ACETONIDE 0.1 % EX CREA: 1.0000 "application " | TOPICAL_CREAM | Freq: Every day | CUTANEOUS | Status: DC | PRN

## 2015-08-28 NOTE — Progress Notes (Signed)
Cardiology Office Note   Date:  08/28/2015   ID:  VERLEE [name], DOB 1922/10/19, MRN [id number]  PCP:  [name], MD  Cardiologist:  Dr. Jens Som    Chief Complaint  Patient presents with  . Hospitalization Follow-up    NSTEMI, STENT< CVA      History of Present Illness: [name] is a 81 y.o. female who presents for post hospitalization for chest pain/NSTEMI and cardiac cath which revealed stenosis of RCA resulting in successful PCI to the distal RCA treated with PTCA and insertion of a 2.7516 mm Synergy DES stent postdilated to 2.90 mm with a 95% stenosis being reduced to 0% and evidence for brisk TIMI-3 flow without evidence for dissection. She also had acute diastolic HF improved with lasix at time of cath.   Echo with EF 65-70%, mild LVH  And mod Pulmonary hypertension.    Post procedure she developed difficulty speaking, rt arm weakness-Code Stroke called- CT head without acute issue, MRI with acute non hemorrhagic infarct.  Carotid doppler no significant stenosis.  Recommended to follow up with Dr. Roda Shutters at [facility] in 2 months.   D/c'd 08/20/15.-though she has seen Dr. Pearlean Brownie in the past so I asked her to call for a follow up appt.  Today she has no chest pain and only her chronic SOB.  No residual from her CVA.  She walks with a rolling walker and has been staying at her daughter's house but with plans to go back to her home.  She is taking asa and plavix without problems.   She saw her PCP yesterday and he drew labs.    Past Medical History  Diagnosis Date  . Hypertension   . Arthritis   . Gout, joint   . Stroke (HCC)   . Anemia   . NSTEMI (non-ST elevated myocardial infarction) (HCC) 08/2015    Past Surgical History  Procedure Laterality Date  . Joint replacement  left knee  . Eye surgery  both  . Dilation and curettage of uterus    . Abdominal hysterectomy    . Hernia repair    . Breast surgery      CYST  . Cardiac catheterization N/A 08/18/2015   Procedure: Left Heart Cath and Coronary Angiography;  Surgeon: Lennette Bihari, MD;  Location: [facility] INVASIVE CV LAB;  Service: Cardiovascular;  Laterality: N/A;  . Cardiac catheterization  08/18/2015    Procedure: Coronary Stent Intervention;  Surgeon: Lennette Bihari, MD;  Location: [facility] INVASIVE CV LAB;  Service: Cardiovascular;;     Current Outpatient Prescriptions  Medication Sig Dispense Refill  . aspirin 81 MG chewable tablet Chew 81 mg by mouth daily.    Marland Kitchen atorvastatin (LIPITOR) 80 MG tablet Take 1 tablet (80 mg total) by mouth daily at 6 PM. 60 tablet 0  . butalbital-acetaminophen-caffeine (FIORICET, ESGIC) 50-325-40 MG tablet Take 1 tablet by mouth every 6 (six) hours as needed for headache. 20 tablet 0  . Calcium Carbonate-Vitamin D (CALTRATE 600+D PO) Take 1 tablet by mouth 2 (two) times daily.    . clopidogrel (PLAVIX) 75 MG tablet Take 1 tablet (75 mg total) by mouth daily with breakfast. 60 tablet 0  . fluticasone (FLONASE) 50 MCG/ACT nasal spray Place 2 sprays into both nostrils as needed for allergies.     Marland Kitchen losartan (COZAAR) 50 MG tablet Take 1 tablet (50 mg total) by mouth daily. 60 tablet 0  . metoprolol (LOPRESSOR) 50 MG tablet Take 25 mg by mouth 2 (  two) times daily.    . Multiple Vitamins-Minerals (MULTIPLE VITAMINS/WOMENS PO) Take 1 tablet by mouth daily. W/vitamin D    . Polyethyl Glycol-Propyl Glycol (SYSTANE OP) Apply 1 drop to eye 2 (two) times daily.    Marland Kitchen. triamcinolone cream (KENALOG) 0.1 % Apply 1 application topically daily as needed. For rash     No current facility-administered medications for this visit.    Allergies:   Meloxicam    Social History:  The patient  reports that she has never smoked. She has never used smokeless tobacco. She reports that she does not drink alcohol or use illicit drugs.   Family History:  The patient's family history includes Heart disease in her mother.    ROS:  General:no colds or fevers, + weight decrease, but appetite is  stable. Skin:+ rashes in groin and under her breasts no ulcers HEENT:no blurred vision, no congestion CV:see HPI PUL:see HPI GI:no diarrhea constipation or melena, no indigestion GU:no hematuria, no dysuria MS:no joint pain, no claudication Neuro:no syncope, no lightheadedness Endo:no diabetes, no thyroid disease  Wt Readings from Last 3 Encounters:  08/28/15 153 lb (69.4 kg)  08/20/15 152 lb 12.5 oz (69.3 kg)  07/15/15 161 lb 12.8 oz (73.392 kg)     PHYSICAL EXAM: VS:  BP 126/64 mmHg  Pulse 62  Ht 5\' 1"  (1.549 m)  Wt 153 lb (69.4 kg)  BMI 28.92 kg/m2 , BMI Body mass index is 28.92 kg/(m^2). General:Pleasant affect, NAD Skin:Warm and dry, brisk capillary refill HEENT:normocephalic, sclera clear, mucus membranes moist Neck:supple, no JVD, no bruits  Heart:S1S2 RRR without murmur, gallup, rub or click Lungs:clear without rales, rhonchi, or wheezes ZOX:WRUEAbd:soft, non tender, + BS, do not palpate liver spleen or masses Ext:no lower ext edema, 2+ pedal pulses, 2+ radial pulses Neuro:alert and oriented, MAE, follows commands, + facial symmetry    EKG:  EKG is ordered today. The ekg ordered today demonstrates SR with inf MI but no acute changes from previous.     Recent Labs: 08/18/2015: B Natriuretic Peptide 415.4* 08/20/2015: BUN 24*; Creatinine, Ser 1.30*; Hemoglobin 11.5*; Platelets 249; Potassium 4.9; Sodium 136    Lipid Panel    Component Value Date/Time   CHOL 141 08/20/2015 0459   TRIG 136 08/20/2015 0459   HDL 47 08/20/2015 0459   CHOLHDL 3.0 08/20/2015 0459   VLDL 27 08/20/2015 0459   LDLCALC 67 08/20/2015 0459       Other studies Reviewed: Additional studies/ records that were reviewed today include: hospitalization, d/c summary, neuro notes EEG normal . Cath. .  Mid RCA lesion, 25% stenosed.  Dist LAD lesion, 30% stenosed.  Post Atrio lesion, 95% stenosed. Post intervention, there is a 0% residual stenosis.  Mild elevation of left ventricular  end-diastolic pressure at 21 mm.  Mild coronary calcification in the LAD with 30% proximal to mid region of irregularity.  Normal left circumflex coronary artery.  Large dominant RCA with 20% irregularity proximal to the acute margin, and focal 95% eccentric stenosis between the PDA and PLA vessel.  Successful PCI to the distal RCA treated with PTCA and insertion of a 2.7516 mm Synergy DES stent postdilated to 2.90 mm with a 95% stenosis being reduced to 0% and evidence for brisk TIMI-3 flow without evidence for dissection.  Recommendation:  Initially, the patient will be maintained on dual antiplatelet therapy with aspirin and Plavix. Medical therapy for concomitant CAD with optimal blood pressure control and aggressive statin therapy.    ASSESSMENT AND PLAN:  1.  Recent NSTEMI pk troponin 5.91 s/p stent to RCA on plavix and asa, statin and BB- no further chest pain. Follow up with Dr. Jens Som in Jan- is scheduled.  2. CAD with recent MI and stent also residual disease continue statin and BB- stable  3. CVA post procedure neuro evaluated continue asa, plavix and statin and bb. needs home health PT it was recommended in the hospital by PT at discharge.  As she plans to return to her home and live alone will order.home adaptation PT and safety eval..  4. HTN- controlled  5. Chronic diastolic HF on no diuretic, wt is stable and she is euvolemic.  6. CKD 3 recheck labs per PCP- he saw yesterday-and drew labs. Cr at discharge 1.30   7. Rash- add kenalog cream under breasts.    Will send PCP records of hospitalization.  Current medicines are reviewed with the patient today.  The patient Has no concerns regarding medicines.  The following changes have been made:  See above Labs/ tests ordered today include:see above  Disposition:   FU:  see above  Signed, Leone Brand, NP  08/28/2015 8:27 AM    [facility] [address] [place], [place], [place]   [address]/ [address] [place], [place] Phone: [phone]; Fax: [phone]  [phone]

## 2015-08-28 NOTE — Patient Instructions (Addendum)
Medication Instructions:  Your physician recommends that you continue on your current medications as directed. Please refer to the Current Medication list given to you today.   Labwork: None ordered  Testing/Procedures: None ordered  Follow-Up: Your physician recommends that you keep your scheduled follow-up appointment with Dr. Jens Somrenshaw.  Your physician states that [facility] [phone]) Please call and make that appointment.    Any Other Special Instructions Will Be Listed Below (If Applicable). Weigh daily Call [phone] if weight climbs more than 3 pounds in a day or 5 pounds in a week. No salt to very little salt in your diet.  No more than 2000 mg in a day. Call if increased shortness of breath or increased swelling.      If you need a refill on your cardiac medications before your next appointment, please call your pharmacy.

## 2015-08-28 NOTE — Telephone Encounter (Signed)
New Message  Rep from [facility] returning call from RN- concerning appt w/ Dr Roda ShuttersXu. Can call [phone]. Please call back and discuss.

## 2015-09-01 DIAGNOSIS — I5031 Acute diastolic (congestive) heart failure: Secondary | ICD-10-CM | POA: Diagnosis not present

## 2015-09-01 DIAGNOSIS — I69322 Dysarthria following cerebral infarction: Secondary | ICD-10-CM | POA: Diagnosis not present

## 2015-09-07 DIAGNOSIS — I69351 Hemiplegia and hemiparesis following cerebral infarction affecting right dominant side: Secondary | ICD-10-CM | POA: Diagnosis not present

## 2015-09-07 DIAGNOSIS — I129 Hypertensive chronic kidney disease with stage 1 through stage 4 chronic kidney disease, or unspecified chronic kidney disease: Secondary | ICD-10-CM | POA: Diagnosis not present

## 2015-09-07 DIAGNOSIS — Z7901 Long term (current) use of anticoagulants: Secondary | ICD-10-CM | POA: Diagnosis not present

## 2015-09-07 DIAGNOSIS — I69322 Dysarthria following cerebral infarction: Secondary | ICD-10-CM | POA: Diagnosis not present

## 2015-09-07 DIAGNOSIS — I5031 Acute diastolic (congestive) heart failure: Secondary | ICD-10-CM | POA: Diagnosis not present

## 2015-09-07 DIAGNOSIS — D649 Anemia, unspecified: Secondary | ICD-10-CM | POA: Diagnosis not present

## 2015-09-07 DIAGNOSIS — N183 Chronic kidney disease, stage 3 (moderate): Secondary | ICD-10-CM | POA: Diagnosis not present

## 2015-09-07 DIAGNOSIS — M1990 Unspecified osteoarthritis, unspecified site: Secondary | ICD-10-CM | POA: Diagnosis not present

## 2015-09-07 DIAGNOSIS — I222 Subsequent non-ST elevation (NSTEMI) myocardial infarction: Secondary | ICD-10-CM | POA: Diagnosis not present

## 2015-09-08 ENCOUNTER — Encounter: Payer: Self-pay | Admitting: Podiatry

## 2015-09-08 ENCOUNTER — Ambulatory Visit (INDEPENDENT_AMBULATORY_CARE_PROVIDER_SITE_OTHER): Payer: Medicare Other | Admitting: Podiatry

## 2015-09-08 VITALS — BP 139/71 | HR 67 | Temp 97.6°F | Resp 12

## 2015-09-08 DIAGNOSIS — B351 Tinea unguium: Secondary | ICD-10-CM

## 2015-09-08 DIAGNOSIS — M79676 Pain in unspecified toe(s): Secondary | ICD-10-CM

## 2015-09-08 DIAGNOSIS — E538 Deficiency of other specified B group vitamins: Secondary | ICD-10-CM | POA: Diagnosis not present

## 2015-09-08 DIAGNOSIS — L03115 Cellulitis of right lower limb: Secondary | ICD-10-CM | POA: Diagnosis not present

## 2015-09-08 MED ORDER — CEPHALEXIN 500 MG PO CAPS: 500.0000 mg | ORAL_CAPSULE | Freq: Two times a day (BID) | ORAL | Status: DC

## 2015-09-08 NOTE — Progress Notes (Signed)
   Subjective:    Patient ID: Paula LeekMary C [name], female    DOB: 08/24/1923, 81 y.o.   MRN: [id number]  HPI   This patient presents today with her daughter present to treatment room complaining of a 3 week history of soreness in the ball of the right foot is gradually increasing discomfort in the area. She is daughters describes recent hospitalization with her mother walking in a slipper resulting in discomfort in the ball area of the right foot. She's noticed some drainage and her socks taking to the area. She's had no self treatment or professional treatment Patient is also complaining of uncomfortable toenails and requesting nail debridement  Review of Systems  Cardiovascular: Positive for leg swelling.       Objective:   Physical Exam  Vascular: Palpable pedal pulses bilaterally No peripheral edema noted bilaterally  Neurological: Ankle reflexes reactive bilaterally  Dermatological:  Plantar aspect right foot has well-organized plantar callus plantar right first MPJ Plantar second MPJ has scaling skin with blister formation and slight serous drainage. The surrounding area has mild erythema without warmth or malodor The toenails are elongated, brittle, hypertrophic and deformed 6-10        Assessment & Plan:   Assessment: Blister formation plantar right second MPJ and sulcus area with low-grade cellulitis Symptomatic onychomycoses 6-10  Plan: Today I reviewed the results of examination with patient and daughter advised oral antibiotics 7 days and topical antibiotic ointment to the plantar blister in the right foot  Plantar blister debrided with topical antibiotic ointment and bandage and recommended this done daily Cephalexin 500 mg by mouth twice a day 7 days  The nails 10 were debrided mechanically and electrically without a bleeding  Reappoint at patient's request Debrided toenails 6-10 mechanically electronically

## 2015-09-08 NOTE — Patient Instructions (Signed)
Today your examination demonstrated a blister formation with local redness around this area on the bottom of the right foot Apply a topical antibiotic ointment such as Neosporin, triple antibiotic ointment to the area daily and cover with gauze Begin taking oral antibiotics one twice a day 7 days

## 2015-09-09 DIAGNOSIS — D649 Anemia, unspecified: Secondary | ICD-10-CM | POA: Diagnosis not present

## 2015-09-09 DIAGNOSIS — M1990 Unspecified osteoarthritis, unspecified site: Secondary | ICD-10-CM | POA: Diagnosis not present

## 2015-09-09 DIAGNOSIS — I69351 Hemiplegia and hemiparesis following cerebral infarction affecting right dominant side: Secondary | ICD-10-CM | POA: Diagnosis not present

## 2015-09-09 DIAGNOSIS — I5031 Acute diastolic (congestive) heart failure: Secondary | ICD-10-CM | POA: Diagnosis not present

## 2015-09-09 DIAGNOSIS — I69322 Dysarthria following cerebral infarction: Secondary | ICD-10-CM | POA: Diagnosis not present

## 2015-09-09 DIAGNOSIS — I129 Hypertensive chronic kidney disease with stage 1 through stage 4 chronic kidney disease, or unspecified chronic kidney disease: Secondary | ICD-10-CM | POA: Diagnosis not present

## 2015-09-09 DIAGNOSIS — N183 Chronic kidney disease, stage 3 (moderate): Secondary | ICD-10-CM | POA: Diagnosis not present

## 2015-09-09 DIAGNOSIS — Z7901 Long term (current) use of anticoagulants: Secondary | ICD-10-CM | POA: Diagnosis not present

## 2015-09-09 DIAGNOSIS — I222 Subsequent non-ST elevation (NSTEMI) myocardial infarction: Secondary | ICD-10-CM | POA: Diagnosis not present

## 2015-09-10 ENCOUNTER — Encounter: Payer: Self-pay | Admitting: *Deleted

## 2015-09-11 DIAGNOSIS — I222 Subsequent non-ST elevation (NSTEMI) myocardial infarction: Secondary | ICD-10-CM | POA: Diagnosis not present

## 2015-09-11 DIAGNOSIS — Z7901 Long term (current) use of anticoagulants: Secondary | ICD-10-CM | POA: Diagnosis not present

## 2015-09-11 DIAGNOSIS — I5031 Acute diastolic (congestive) heart failure: Secondary | ICD-10-CM | POA: Diagnosis not present

## 2015-09-11 DIAGNOSIS — I69322 Dysarthria following cerebral infarction: Secondary | ICD-10-CM | POA: Diagnosis not present

## 2015-09-11 DIAGNOSIS — I129 Hypertensive chronic kidney disease with stage 1 through stage 4 chronic kidney disease, or unspecified chronic kidney disease: Secondary | ICD-10-CM | POA: Diagnosis not present

## 2015-09-11 DIAGNOSIS — I69351 Hemiplegia and hemiparesis following cerebral infarction affecting right dominant side: Secondary | ICD-10-CM | POA: Diagnosis not present

## 2015-09-11 DIAGNOSIS — N183 Chronic kidney disease, stage 3 (moderate): Secondary | ICD-10-CM | POA: Diagnosis not present

## 2015-09-11 DIAGNOSIS — M1990 Unspecified osteoarthritis, unspecified site: Secondary | ICD-10-CM | POA: Diagnosis not present

## 2015-09-11 DIAGNOSIS — D649 Anemia, unspecified: Secondary | ICD-10-CM | POA: Diagnosis not present

## 2015-09-14 DIAGNOSIS — I69322 Dysarthria following cerebral infarction: Secondary | ICD-10-CM | POA: Diagnosis not present

## 2015-09-14 DIAGNOSIS — I129 Hypertensive chronic kidney disease with stage 1 through stage 4 chronic kidney disease, or unspecified chronic kidney disease: Secondary | ICD-10-CM | POA: Diagnosis not present

## 2015-09-14 DIAGNOSIS — N183 Chronic kidney disease, stage 3 (moderate): Secondary | ICD-10-CM | POA: Diagnosis not present

## 2015-09-14 DIAGNOSIS — I222 Subsequent non-ST elevation (NSTEMI) myocardial infarction: Secondary | ICD-10-CM | POA: Diagnosis not present

## 2015-09-14 DIAGNOSIS — M1990 Unspecified osteoarthritis, unspecified site: Secondary | ICD-10-CM | POA: Diagnosis not present

## 2015-09-14 DIAGNOSIS — I5031 Acute diastolic (congestive) heart failure: Secondary | ICD-10-CM | POA: Diagnosis not present

## 2015-09-14 DIAGNOSIS — Z7901 Long term (current) use of anticoagulants: Secondary | ICD-10-CM | POA: Diagnosis not present

## 2015-09-14 DIAGNOSIS — D649 Anemia, unspecified: Secondary | ICD-10-CM | POA: Diagnosis not present

## 2015-09-14 DIAGNOSIS — I69351 Hemiplegia and hemiparesis following cerebral infarction affecting right dominant side: Secondary | ICD-10-CM | POA: Diagnosis not present

## 2015-09-16 DIAGNOSIS — I69322 Dysarthria following cerebral infarction: Secondary | ICD-10-CM | POA: Diagnosis not present

## 2015-09-16 DIAGNOSIS — I222 Subsequent non-ST elevation (NSTEMI) myocardial infarction: Secondary | ICD-10-CM | POA: Diagnosis not present

## 2015-09-16 DIAGNOSIS — I69351 Hemiplegia and hemiparesis following cerebral infarction affecting right dominant side: Secondary | ICD-10-CM | POA: Diagnosis not present

## 2015-09-16 DIAGNOSIS — Z7901 Long term (current) use of anticoagulants: Secondary | ICD-10-CM | POA: Diagnosis not present

## 2015-09-16 DIAGNOSIS — I129 Hypertensive chronic kidney disease with stage 1 through stage 4 chronic kidney disease, or unspecified chronic kidney disease: Secondary | ICD-10-CM | POA: Diagnosis not present

## 2015-09-16 DIAGNOSIS — N183 Chronic kidney disease, stage 3 (moderate): Secondary | ICD-10-CM | POA: Diagnosis not present

## 2015-09-16 DIAGNOSIS — D649 Anemia, unspecified: Secondary | ICD-10-CM | POA: Diagnosis not present

## 2015-09-16 DIAGNOSIS — M1990 Unspecified osteoarthritis, unspecified site: Secondary | ICD-10-CM | POA: Diagnosis not present

## 2015-09-16 DIAGNOSIS — I5031 Acute diastolic (congestive) heart failure: Secondary | ICD-10-CM | POA: Diagnosis not present

## 2015-09-18 DIAGNOSIS — I222 Subsequent non-ST elevation (NSTEMI) myocardial infarction: Secondary | ICD-10-CM | POA: Diagnosis not present

## 2015-09-18 DIAGNOSIS — Z7901 Long term (current) use of anticoagulants: Secondary | ICD-10-CM | POA: Diagnosis not present

## 2015-09-18 DIAGNOSIS — D649 Anemia, unspecified: Secondary | ICD-10-CM | POA: Diagnosis not present

## 2015-09-18 DIAGNOSIS — I69322 Dysarthria following cerebral infarction: Secondary | ICD-10-CM | POA: Diagnosis not present

## 2015-09-18 DIAGNOSIS — I129 Hypertensive chronic kidney disease with stage 1 through stage 4 chronic kidney disease, or unspecified chronic kidney disease: Secondary | ICD-10-CM | POA: Diagnosis not present

## 2015-09-18 DIAGNOSIS — I5031 Acute diastolic (congestive) heart failure: Secondary | ICD-10-CM | POA: Diagnosis not present

## 2015-09-18 DIAGNOSIS — M1990 Unspecified osteoarthritis, unspecified site: Secondary | ICD-10-CM | POA: Diagnosis not present

## 2015-09-18 DIAGNOSIS — N183 Chronic kidney disease, stage 3 (moderate): Secondary | ICD-10-CM | POA: Diagnosis not present

## 2015-09-18 DIAGNOSIS — I69351 Hemiplegia and hemiparesis following cerebral infarction affecting right dominant side: Secondary | ICD-10-CM | POA: Diagnosis not present

## 2015-09-23 DIAGNOSIS — D649 Anemia, unspecified: Secondary | ICD-10-CM | POA: Diagnosis not present

## 2015-09-23 DIAGNOSIS — I129 Hypertensive chronic kidney disease with stage 1 through stage 4 chronic kidney disease, or unspecified chronic kidney disease: Secondary | ICD-10-CM | POA: Diagnosis not present

## 2015-09-23 DIAGNOSIS — I69322 Dysarthria following cerebral infarction: Secondary | ICD-10-CM | POA: Diagnosis not present

## 2015-09-23 DIAGNOSIS — I69351 Hemiplegia and hemiparesis following cerebral infarction affecting right dominant side: Secondary | ICD-10-CM | POA: Diagnosis not present

## 2015-09-23 DIAGNOSIS — I5031 Acute diastolic (congestive) heart failure: Secondary | ICD-10-CM | POA: Diagnosis not present

## 2015-09-23 DIAGNOSIS — M1990 Unspecified osteoarthritis, unspecified site: Secondary | ICD-10-CM | POA: Diagnosis not present

## 2015-09-23 DIAGNOSIS — N183 Chronic kidney disease, stage 3 (moderate): Secondary | ICD-10-CM | POA: Diagnosis not present

## 2015-09-23 DIAGNOSIS — I222 Subsequent non-ST elevation (NSTEMI) myocardial infarction: Secondary | ICD-10-CM | POA: Diagnosis not present

## 2015-09-23 DIAGNOSIS — Z7901 Long term (current) use of anticoagulants: Secondary | ICD-10-CM | POA: Diagnosis not present

## 2015-09-29 DIAGNOSIS — I69354 Hemiplegia and hemiparesis following cerebral infarction affecting left non-dominant side: Secondary | ICD-10-CM | POA: Diagnosis not present

## 2015-09-29 DIAGNOSIS — E1122 Type 2 diabetes mellitus with diabetic chronic kidney disease: Secondary | ICD-10-CM | POA: Diagnosis not present

## 2015-09-29 DIAGNOSIS — N183 Chronic kidney disease, stage 3 (moderate): Secondary | ICD-10-CM | POA: Diagnosis not present

## 2015-09-29 DIAGNOSIS — I5032 Chronic diastolic (congestive) heart failure: Secondary | ICD-10-CM | POA: Diagnosis not present

## 2015-09-30 DIAGNOSIS — I222 Subsequent non-ST elevation (NSTEMI) myocardial infarction: Secondary | ICD-10-CM | POA: Diagnosis not present

## 2015-09-30 DIAGNOSIS — I69351 Hemiplegia and hemiparesis following cerebral infarction affecting right dominant side: Secondary | ICD-10-CM | POA: Diagnosis not present

## 2015-09-30 DIAGNOSIS — I5031 Acute diastolic (congestive) heart failure: Secondary | ICD-10-CM | POA: Diagnosis not present

## 2015-09-30 DIAGNOSIS — N183 Chronic kidney disease, stage 3 (moderate): Secondary | ICD-10-CM | POA: Diagnosis not present

## 2015-09-30 DIAGNOSIS — I69322 Dysarthria following cerebral infarction: Secondary | ICD-10-CM | POA: Diagnosis not present

## 2015-09-30 DIAGNOSIS — M1990 Unspecified osteoarthritis, unspecified site: Secondary | ICD-10-CM | POA: Diagnosis not present

## 2015-09-30 DIAGNOSIS — D649 Anemia, unspecified: Secondary | ICD-10-CM | POA: Diagnosis not present

## 2015-09-30 DIAGNOSIS — I129 Hypertensive chronic kidney disease with stage 1 through stage 4 chronic kidney disease, or unspecified chronic kidney disease: Secondary | ICD-10-CM | POA: Diagnosis not present

## 2015-09-30 DIAGNOSIS — Z7901 Long term (current) use of anticoagulants: Secondary | ICD-10-CM | POA: Diagnosis not present

## 2015-10-09 DIAGNOSIS — E538 Deficiency of other specified B group vitamins: Secondary | ICD-10-CM | POA: Diagnosis not present

## 2015-10-11 HISTORY — PX: REFRACTIVE SURGERY: SHX103

## 2015-10-29 ENCOUNTER — Other Ambulatory Visit: Payer: Self-pay

## 2015-10-29 DIAGNOSIS — H401112 Primary open-angle glaucoma, right eye, moderate stage: Secondary | ICD-10-CM | POA: Diagnosis not present

## 2015-10-29 DIAGNOSIS — H01021 Squamous blepharitis right upper eyelid: Secondary | ICD-10-CM | POA: Diagnosis not present

## 2015-10-29 DIAGNOSIS — Z1231 Encounter for screening mammogram for malignant neoplasm of breast: Secondary | ICD-10-CM

## 2015-10-29 DIAGNOSIS — H01024 Squamous blepharitis left upper eyelid: Secondary | ICD-10-CM | POA: Diagnosis not present

## 2015-10-29 DIAGNOSIS — H01022 Squamous blepharitis right lower eyelid: Secondary | ICD-10-CM | POA: Diagnosis not present

## 2015-10-29 DIAGNOSIS — H40012 Open angle with borderline findings, low risk, left eye: Secondary | ICD-10-CM | POA: Diagnosis not present

## 2015-11-09 NOTE — Progress Notes (Signed)
HPI: Follow-up coronary artery disease. Carotid Dopplers October 2016 showed no significant stenosis. Admitted November 2016 and ruled in for a non-ST elevation myocardial infarction. Cardiac catheterization revealed a 95% distal RCA. Patient had a drug-eluting stent. Procedure complicated by CVA. Echocardiogram November 2016 showed normal LV function, grade 1 diastolic dysfunction, mild mitral regurgitation, moderate tricuspid regurgitation and moderate to severe pulmonary hypertension. Since last seen the patient has dyspnea with more extreme activities but not with routine activities. It is relieved with rest. It is not associated with chest pain. There is no orthopnea, PND or pedal edema. There is no syncope or palpitations. There is no exertional chest pain.   Current Outpatient Prescriptions  Medication Sig Dispense Refill  . aspirin 81 MG chewable tablet Chew 81 mg by mouth daily.    Marland Kitchen atorvastatin (LIPITOR) 80 MG tablet Take 1 tablet (80 mg total) by mouth daily at 6 PM. 60 tablet 0  . butalbital-acetaminophen-caffeine (FIORICET, ESGIC) 50-325-40 MG tablet Take 1 tablet by mouth every 6 (six) hours as needed for headache. 20 tablet 0  . Calcium Carbonate-Vitamin D (CALTRATE 600+D PO) Take 1 tablet by mouth 2 (two) times daily.    . cephALEXin (KEFLEX) 500 MG capsule Take 1 capsule (500 mg total) by mouth 2 (two) times daily. 14 capsule 0  . clopidogrel (PLAVIX) 75 MG tablet Take 1 tablet (75 mg total) by mouth daily with breakfast. 60 tablet 0  . fluticasone (FLONASE) 50 MCG/ACT nasal spray Place 2 sprays into both nostrils as needed for allergies.     Marland Kitchen latanoprost (XALATAN) 0.005 % ophthalmic solution PLACE 1 DROP INTO RIGHT EYE EVERY DAY  12  . losartan (COZAAR) 50 MG tablet Take 1 tablet (50 mg total) by mouth daily. 60 tablet 0  . metoprolol (LOPRESSOR) 50 MG tablet Take 25 mg by mouth 2 (two) times daily.    . Multiple Vitamins-Minerals (MULTIPLE VITAMINS/WOMENS PO) Take 1  tablet by mouth daily. W/vitamin D    . Polyethyl Glycol-Propyl Glycol (SYSTANE OP) Apply 1 drop to eye 2 (two) times daily.    . silver sulfADIAZINE (SILVADENE) 1 % cream Apply 1 application topically daily. 50 g 0  . triamcinolone cream (KENALOG) 0.1 % Apply 1 application topically daily as needed. For rash 30 g 1   No current facility-administered medications for this visit.     Past Medical History  Diagnosis Date  . Hypertension   . Arthritis   . Gout, joint   . Stroke (HCC)   . Anemia   . NSTEMI (non-ST elevated myocardial infarction) (HCC) 08/2015    Past Surgical History  Procedure Laterality Date  . Joint replacement  left knee  . Eye surgery  both  . Dilation and curettage of uterus    . Abdominal hysterectomy    . Hernia repair    . Breast surgery      CYST  . Cardiac catheterization N/A 08/18/2015    Procedure: Left Heart Cath and Coronary Angiography;  Surgeon: Lennette Bihari, MD;  Location: [facility] INVASIVE CV LAB;  Service: Cardiovascular;  Laterality: N/A;  . Cardiac catheterization  08/18/2015    Procedure: Coronary Stent Intervention;  Surgeon: Lennette Bihari, MD;  Location: [facility] INVASIVE CV LAB;  Service: Cardiovascular;;    Social History   Social History  . Marital Status: Divorced    Spouse Name: N/A  . Number of Children: 3  . Years of Education: 12   Occupational History  .  retired   Social History Main Topics  . Smoking status: Never Smoker   . Smokeless tobacco: Never Used  . Alcohol Use: No  . Drug Use: No  . Sexual Activity: No   Other Topics Concern  . Not on file   Social History Narrative   Patient is single and lives at home alone.   Retired   Education 12th grade    Right handed   Caffeine  sometimes    Family History  Problem Relation Age of Onset  . Heart disease Mother     ROS: Some knee pain but no fevers or chills, productive cough, hemoptysis, dysphasia, odynophagia, melena, hematochezia, dysuria, hematuria, rash,  seizure activity, orthopnea, PND, pedal edema, claudication. Remaining systems are negative.  Physical Exam: Well-developed well-nourished in no acute distress.  Skin is warm and dry.  HEENT is normal.  Neck is supple.  Chest is clear to auscultation with normal expansion.  Cardiovascular exam is regular rate and rhythm. 3/6 systolic murmur left sternal border. Abdominal exam nontender or distended. No masses palpated. Extremities show no edema. neuro grossly intact  ECG 08/28/2015-sinus rhythm, inferior infarct.

## 2015-11-11 ENCOUNTER — Encounter: Payer: Self-pay | Admitting: Podiatry

## 2015-11-11 ENCOUNTER — Ambulatory Visit (INDEPENDENT_AMBULATORY_CARE_PROVIDER_SITE_OTHER): Payer: Medicare Other | Admitting: Podiatry

## 2015-11-11 ENCOUNTER — Ambulatory Visit (INDEPENDENT_AMBULATORY_CARE_PROVIDER_SITE_OTHER): Payer: Medicare Other

## 2015-11-11 VITALS — BP 107/72 | HR 69 | Resp 12

## 2015-11-11 DIAGNOSIS — E538 Deficiency of other specified B group vitamins: Secondary | ICD-10-CM | POA: Diagnosis not present

## 2015-11-11 DIAGNOSIS — R52 Pain, unspecified: Secondary | ICD-10-CM

## 2015-11-11 DIAGNOSIS — L89891 Pressure ulcer of other site, stage 1: Secondary | ICD-10-CM | POA: Diagnosis not present

## 2015-11-11 DIAGNOSIS — L97511 Non-pressure chronic ulcer of other part of right foot limited to breakdown of skin: Secondary | ICD-10-CM

## 2015-11-11 MED ORDER — SILVER SULFADIAZINE 1 % EX CREA: 1.0000 "application " | TOPICAL_CREAM | Freq: Every day | CUTANEOUS | Status: DC

## 2015-11-11 NOTE — Patient Instructions (Signed)
Betadine Soak Instructions  Purchase an 8 oz. bottle of BETADINE solution (Povidone)  THE DAY AFTER THE PROCEDURE  Place 1 tablespoon of betadine solution in a quart of warm tap water.  Submerge your foot or feet with outer bandage intact for the initial soak; this will allow the bandage to become moist and wet for easy lift off.  Once you remove your bandage, continue to soak in the solution for 3-5 minutes.  This soak should be done one-twice a day.  Next, remove your foot or feet from solution, blot dry the affected area and cover.  You may use a band aid large enough to cover the area or use gauze and tape.  Apply Silvadene cream to skin ulcer once a daily after soaking and cover with gauze IF YOUR SKIN BECOMES IRRITATED WHILE USING THESE INSTRUCTIONS, IT IS OKAY TO SWITCH TO EPSOM SALTS AND WATER OR WHITE VINEGAR AND WATER.

## 2015-11-12 ENCOUNTER — Encounter: Payer: Self-pay | Admitting: Cardiology

## 2015-11-12 ENCOUNTER — Ambulatory Visit (INDEPENDENT_AMBULATORY_CARE_PROVIDER_SITE_OTHER): Payer: Medicare Other | Admitting: Cardiology

## 2015-11-12 VITALS — BP 150/76 | HR 72 | Ht 61.0 in | Wt 155.1 lb

## 2015-11-12 DIAGNOSIS — I2510 Atherosclerotic heart disease of native coronary artery without angina pectoris: Secondary | ICD-10-CM | POA: Diagnosis not present

## 2015-11-12 DIAGNOSIS — I2583 Coronary atherosclerosis due to lipid rich plaque: Secondary | ICD-10-CM

## 2015-11-12 DIAGNOSIS — I10 Essential (primary) hypertension: Secondary | ICD-10-CM | POA: Diagnosis not present

## 2015-11-12 DIAGNOSIS — E785 Hyperlipidemia, unspecified: Secondary | ICD-10-CM | POA: Insufficient documentation

## 2015-11-12 NOTE — Patient Instructions (Signed)
Your physician wants you to follow-up in: 6 MONTHS WITH DR CRENSHAW You will receive a reminder letter in the mail two months in advance. If you don't receive a letter, please call our office to schedule the follow-up appointment.   If you need a refill on your cardiac medications before your next appointment, please call your pharmacy.  

## 2015-11-12 NOTE — Progress Notes (Signed)
Patient ID: [name], female   DOB: June 20, 1923, 82 y.o.   MRN: [id number]  Subjective: This patient presents today with her daughter present in the treatment room. Her daughter is concerned about a draining skin lesion on the plantar aspect of the right foot for the last 2 months. She's noticed some slight increased drainage in the area as slight malodor. They have been soaking the foot. Denied any fever, swelling. Patient has some low-grade discomfort in the area. Patient was last seen on 09/08/2015 with a well-organized plantar callus in the plantar aspect of right foot. At that time there is a blister formation and cephalexin 500 mg by mouth twice a day was prescribed. Patient did complete the antibiotic without any difficulty.  Objective: Patient is responsive to questioning with assistance of her daughter  Vascular: No calf edema or calf tenderness bilaterally DP and PT pulses trace palpable bilaterally  Neurological: Deferred  Dermatological: A aspect of the right foot has a macerated, hemorrhagic hyperkeratotic lesion with slight malodor noted. After debridement of the hyperkeratotic lesion a superficial ulcer measuring 5 mm was present with a granular base. The malodor seem to reduce after debridement. The ulcer was probed and it is superficial. There is no active drainage, warmth surrounding the ulcer site  Musculoskeletal: HAV deformities bilaterally Hammertoe second bilaterally  X-ray examination of the right foot today reveals no increase of soft tissue density, gas formation. Please note when the x-ray report was dictated it was incorrectly labeled as left foot, however, the x-ray was the right foot Decreased bone density noted throughout all views HAV deformity Contracture and subluxed second MPJ with hammertoe second noted Radiographic impression: No acute bony abnormality noted.  Assessment: Ulceration of the plantar second MPJ area associated with deformity of the  second MPJ joint. After debridement the malodor was not noted. The ulceration appeared superficial and did not probe to bone. The x-ray demonstrated no gas formation or visible sign of bone activity  Plan: Debridement of ulcer on the plantar acid right foot with application of Silvadene cream and gauze dressing Patient will do Betadine soaks 1-2 times daily and apply Silvadene to the area A flat rubber surgical shoe was dispensed and patient requested to ambulate with a shoe on and she seemed to have a stable gait with assistance of a walker Additional Plastizote was placed in the surgical shoe  Patient's daughter will do daily soaks and Betadine apply Silvadene cream to minimize standing walking. Patient's daughter and patient advised that he felt that any sudden pain, swelling, fever present to the emergency department otherwise reappoint 7 days

## 2015-11-12 NOTE — Assessment & Plan Note (Signed)
Blood pressure borderline but typically control. Continue present medications and follow.

## 2015-11-12 NOTE — Assessment & Plan Note (Signed)
Continue statin. 

## 2015-11-12 NOTE — Assessment & Plan Note (Signed)
Continue aspirin, Plavix and statin. 

## 2015-11-13 ENCOUNTER — Encounter: Payer: Self-pay | Admitting: Neurology

## 2015-11-13 ENCOUNTER — Ambulatory Visit (INDEPENDENT_AMBULATORY_CARE_PROVIDER_SITE_OTHER): Payer: Medicare Other | Admitting: Neurology

## 2015-11-13 VITALS — BP 144/65 | HR 56 | Ht 60.0 in | Wt 159.7 lb

## 2015-11-13 DIAGNOSIS — I639 Cerebral infarction, unspecified: Secondary | ICD-10-CM | POA: Diagnosis not present

## 2015-11-13 NOTE — Patient Instructions (Signed)
I had a long d/w patient and daughter about her recent stroke, risk for recurrent stroke/TIAs, personally independently reviewed imaging studies and stroke evaluation results and answered questions.Continue aspirin 81 mg daily and clopidogrel 75 mg daily  for secondary stroke prevention and maintain strict control of hypertension with blood pressure goal below 130/90, diabetes with hemoglobin A1c goal below 6.5% and lipids with LDL cholesterol goal below 70 mg/dL. I also advised the patient to eat a healthy diet with plenty of whole grains, cereals, fruits and vegetables, exercise regularly and maintain ideal body weight Followup in the future with Latrelle Dodrill, nurse practitioner in 6 months or call earlier if necessary

## 2015-11-13 NOTE — Progress Notes (Signed)
[facility]  PATIENT: [name] DOB: May 26, 1923   REASON FOR VISIT: Follow-up for history of stroke with risk factors of hypertension and hyperlipidemia HISTORY FROM: Patient and daughter    HISTORY OF PRESENT ILLNESS: [name], 82 year old female returns for follow-up. 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 She is currently on aspirin 325 daily and has not had further stroke or TIA symptoms since December 2013. Her lipids are followed by Dr. Thea Silversmith. Her last carotid ultrasound on 11/15/2013 was negative for significant stenosis. She had one fall in May without injury. She is using a single-point cane to ambulate. She continues to live in her own home and fairly independent with activities of daily living. She continues to drive without difficulty. She denies any memory issues. She returns for reevaluation   HISTORY:82 year old Caucasian lady with remote right subcortical infarct from small vessel disease in December 2013 with vascular risk factors of hyperlipidemia, hypertension, age and sex.   She returns for followup of her last visit on 01/10/14 with me. She continues to do well from a neurovascular standpoint without any recurrent stroke or TIA symptoms since her initial stroke in December 2013. She is tolerating aspirin well without any side effects. She states her blood pressure is quite good and today it is 112/62 in our office. She has her annual physical scheduled in December at Dr. Benard Halsted office and plans to have labwork then. Carotid Ultrasound on 11/15/13 showed no significant stenosis. She has had no new neurovascular symptoms. Her main deficit from the stroke is weakness in the left leg. She a fall in May and suffered a compression fracture in her spine, a stress fracture in her leg and cracked ribs, which are healing. Her daughter states she  tripped and fell off of the front porch. She is using a walker most of the time. Her main complaint is back pain, which she takes aleve for.  Update 11/13/2015 :  She returns for follow-up after last visit accompanied today by her daughter. Patient was admitted in November 2016 with acute myocardial infarction and underwent cardiac stent and following the procedure and some slurred speech lasted 5 minutes. MRI scan of the brain showed tiny embolic infarcts related to the procedure. Echocardiogram showed normal ejection fraction without cardiac source of embolism. Lipid profile showed LDL 82 mg percent. Hemoglobin A1c was 5.9%. Patient recovered quickly. She is doing well without any residual neurological deficits. She remains on aspirin and Plavix and bruises easily but has had no bleeding problems. Her blood pressure is well controlled usually though it is slightly high today and 144/65. His tolerating Lipitor well without myalgias or muscle aches. She is quite independent in her activities of daily living. She had carotid ultrasound done on 08/05/15 following the last visit which had shown no significant extracranial stenosis.  REVIEW OF SYSTEMS: Full 14 system review of systems performed and notable only for those listed, all others are neg: Eye redness, light sensitivity, choking, runny nose, hearing loss, incontinence of bladder, frequency of urination, joint and back pain, walking difficulty, rash   ALLERGIES: Allergies  Allergen Reactions  . Meloxicam Hives    HOME MEDICATIONS: Outpatient Prescriptions Prior to Visit  Medication Sig Dispense Refill  . aspirin 81 MG chewable tablet Chew 81 mg by mouth daily.    Marland Kitchen atorvastatin (LIPITOR) 80 MG tablet Take 1 tablet (80 mg total) by mouth  daily at 6 PM. 60 tablet 0  . Calcium Carbonate-Vitamin D (CALTRATE 600+D PO) Take 1 tablet by mouth 2 (two) times daily.    . cephALEXin (KEFLEX) 500 MG capsule Take 1 capsule (500 mg total) by mouth 2 (two)  times daily. 14 capsule 0  . clopidogrel (PLAVIX) 75 MG tablet Take 1 tablet (75 mg total) by mouth daily with breakfast. 60 tablet 0  . fluticasone (FLONASE) 50 MCG/ACT nasal spray Place 2 sprays into both nostrils as needed for allergies.     Marland Kitchen latanoprost (XALATAN) 0.005 % ophthalmic solution PLACE 1 DROP INTO RIGHT EYE EVERY DAY  12  . losartan (COZAAR) 50 MG tablet Take 1 tablet (50 mg total) by mouth daily. 60 tablet 0  . metoprolol (LOPRESSOR) 50 MG tablet Take 25 mg by mouth 2 (two) times daily.    . Multiple Vitamins-Minerals (MULTIPLE VITAMINS/WOMENS PO) Take 1 tablet by mouth daily. W/vitamin D    . Polyethyl Glycol-Propyl Glycol (SYSTANE OP) Apply 1 drop to eye 2 (two) times daily.    . silver sulfADIAZINE (SILVADENE) 1 % cream Apply 1 application topically daily. 50 g 0  . triamcinolone cream (KENALOG) 0.1 % Apply 1 application topically daily as needed. For rash 30 g 1  . butalbital-acetaminophen-caffeine (FIORICET, ESGIC) 50-325-40 MG tablet Take 1 tablet by mouth every 6 (six) hours as needed for headache. (Patient not taking: Reported on 11/13/2015) 20 tablet 0   No facility-administered medications prior to visit.    PAST MEDICAL HISTORY: Past Medical History  Diagnosis Date  . Hypertension   . Arthritis   . Gout, joint   . Stroke (HCC)   . Anemia   . NSTEMI (non-ST elevated myocardial infarction) (HCC) 08/2015    PAST SURGICAL HISTORY: Past Surgical History  Procedure Laterality Date  . Joint replacement  left knee  . Eye surgery  both  . Dilation and curettage of uterus    . Abdominal hysterectomy    . Hernia repair    . Breast surgery      CYST  . Cardiac catheterization N/A 08/18/2015    Procedure: Left Heart Cath and Coronary Angiography;  Surgeon: Lennette Bihari, MD;  Location: [facility] INVASIVE CV LAB;  Service: Cardiovascular;  Laterality: N/A;  . Cardiac catheterization  08/18/2015    Procedure: Coronary Stent Intervention;  Surgeon: Lennette Bihari, MD;   Location: [facility] INVASIVE CV LAB;  Service: Cardiovascular;;    FAMILY HISTORY: Family History  Problem Relation Age of Onset  . Heart disease Mother   . Stroke Mother     SOCIAL HISTORY: Social History   Social History  . Marital Status: Divorced    Spouse Name: N/A  . Number of Children: 3  . Years of Education: 12   Occupational History  .      retired   Social History Main Topics  . Smoking status: Never Smoker   . Smokeless tobacco: Never Used  . Alcohol Use: No  . Drug Use: No  . Sexual Activity: No   Other Topics Concern  . Not on file   Social History Narrative   Patient is single and lives at home alone.   Retired   Education 12th grade    Right handed   Caffeine  sometimes     PHYSICAL EXAM  Filed Vitals:   11/13/15 1045  BP: 144/65  Pulse: 56  Height: 5' (1.524 m)  Weight: 159 lb 11.2 oz (72.439 kg)   Body mass  index is 31.19 kg/(m^2). General: well developed, well nourished, elderly Caucasian female, seated, in no evident distress  Head: head normocephalic and atraumatic. Orohparynx benign  Neck: supple with no carotid or supraclavicular bruits  Cardiovascular: regular rate and rhythm, 3/6 systolic murmur Musculoskeletal: no deformity  Skin: no rash/petichiae  Vascular: Normal pulses all extremities   Neurologic Exam  Mental Status: Awake and fully alert. Oriented to place and time. Recent and remote memory intact. Attention span, concentration and fund of knowledge appropriate. Mood and affect appropriate.  Cranial Nerves: Fundoscopic exam deferred. Pupils equal, briskly reactive to light. Extraocular movements full without nystagmus. Visual fields full to confrontation. Decreased hearing.. Facial sensation intact. Face, tongue, palate moves normally and symmetrically.  Motor: Normal bulk and tone. Normal strength in all tested extremity muscles. Diminished fine finger movements on the left. Orbits right over left upper extremity.   Sensory: intact to touch and pinprick and vibratory sensation.  Coordination: Rapid alternating movements normal in all extremities. Finger-to-nose and heel-to-shin performed accurately bilaterally.  Gait and Station: Arises from chair without difficulty. Stance is wide based. Gait demonstrates normal stride length and balance. Not able to heel, toe and tandem walk without difficulty. Favors left leg. Ambulates with a walker Reflexes: 1+ and symmetric.   DIAGNOSTIC DATA (LABS, IMAGING, TESTING) -  ASSESSMENT AND PLAN  82 y.o. year old Caucasian lady with  right subcortical infarct from small vessel disease in December 2013 with vascular risk factors of hyperlipidemia, hypertension, age and sex. Recent small card embolic infarct following cardiac stenting in November 2016 but no lasting neurological deficits. She is doing well from a neurovascular standpoint.       PLAN:  I had a long d/w patient and daughter about her recent stroke, risk for recurrent stroke/TIAs, personally independently reviewed imaging studies and stroke evaluation results and answered questions.Continue aspirin 81 mg daily and clopidogrel 75 mg daily  for secondary stroke prevention and maintain strict control of hypertension with blood pressure goal below 130/90, diabetes with hemoglobin A1c goal below 6.5% and lipids with LDL cholesterol goal below 70 mg/dL. I also advised the patient to eat a healthy diet with plenty of whole grains, cereals, fruits and vegetables, exercise regularly and maintain ideal body weight Followup in the future with Latrelle Dodrill, nurse practitioner in 6 months or call earlier if necessary Delia Heady, MD [facility] [address] [place], [place] [address] [phone]

## 2015-11-17 ENCOUNTER — Ambulatory Visit
Admission: RE | Admit: 2015-11-17 | Discharge: 2015-11-17 | Disposition: A | Discharge status: Home / Self Care | Payer: Medicare Other | Source: Ambulatory Visit

## 2015-11-17 DIAGNOSIS — Z1231 Encounter for screening mammogram for malignant neoplasm of breast: Secondary | ICD-10-CM | POA: Diagnosis not present

## 2015-11-18 ENCOUNTER — Ambulatory Visit (INDEPENDENT_AMBULATORY_CARE_PROVIDER_SITE_OTHER): Payer: Medicare Other | Admitting: Podiatry

## 2015-11-18 ENCOUNTER — Encounter: Payer: Self-pay | Admitting: Podiatry

## 2015-11-18 VITALS — BP 134/60 | HR 69 | Temp 97.6°F | Resp 12

## 2015-11-18 DIAGNOSIS — L89891 Pressure ulcer of other site, stage 1: Secondary | ICD-10-CM | POA: Diagnosis not present

## 2015-11-18 DIAGNOSIS — L97511 Non-pressure chronic ulcer of other part of right foot limited to breakdown of skin: Secondary | ICD-10-CM

## 2015-11-18 NOTE — Patient Instructions (Signed)
Betadine soaks the right foot 1-2 minutes daily Apply Silvadene cream to the skin ulcer on the bottom of right foot once daily and cover with gauze Wear surgical shoe on right foot at all times with weightbearing

## 2015-11-19 NOTE — Progress Notes (Signed)
Patient ID: [name], female   DOB: 10-22-1922, 82 y.o.   MRN: [id number]  Subjective: This patient presents for follow up care follow ulceration cellulitis with symptoms beginning on 09/08/2015. Patient at short course of cephalexin without any difficulty from medication. She is currently wearing a surgical shoe with Plastizote insole and ambulating without difficulty. Wound care includes Betadine soaks and application of Silvadene to plantar skin lesion. The plantar wound on the visit of 11/11/2015 was 5 mm  Objective: Orientated 3 BP 134/60 Temperature 97.6  Daughter present treatment room  Plantar right foot has 2 mm skin ulcer with granular base surrounded with slight keratoses and scaling. There is no surrounding erythema, edema, drainage  The toenails 6-10 are elongated, discolored and deformed  Assessment: Noninfected plantar skin ulcers second MPJ right that is reducing in size  Plan: Debride plantar skin ulcer and dressed with Silvadene and gauze Maintain surgical shoe right Rinse wound out once daily for 1-2 minutes and Betadine  Reappoint 7 days for follow-up for skin ulcer Also, debride all toenails at this visit

## 2015-11-24 ENCOUNTER — Ambulatory Visit (INDEPENDENT_AMBULATORY_CARE_PROVIDER_SITE_OTHER): Payer: Medicare Other | Admitting: Podiatry

## 2015-11-24 ENCOUNTER — Encounter: Payer: Self-pay | Admitting: Podiatry

## 2015-11-24 VITALS — BP 158/69 | HR 88 | Temp 97.6°F | Resp 12

## 2015-11-24 DIAGNOSIS — B351 Tinea unguium: Secondary | ICD-10-CM | POA: Diagnosis not present

## 2015-11-24 DIAGNOSIS — M79676 Pain in unspecified toe(s): Secondary | ICD-10-CM | POA: Diagnosis not present

## 2015-11-24 NOTE — Progress Notes (Signed)
Patient ID: REKITA MIOTKE, female   DOB: May 03, 1923, 82 y.o.   MRN: [id number]  Subjective: This patient presents for follow up care follow ulceration cellulitis with symptoms beginning on 09/08/2015. Patient at short course of cephalexin without any difficulty from medication. She is currently wearing a surgical shoe with Plastizote insole and ambulating without difficulty. Wound care includes Betadine soaks and application of Silvadene to plantar skin lesion. The plantar wound on the visit of 11/11/2015 was 5 mm. plantar skin lesion size on February 2017 was 2 mm  Patient presents with son-in-law today for follow-up visit for plantar skin lesion right as well as requesting debridement of mycotic toenails  Objective: Orientated 3 Plantar skin lesion right has keratoses with slight bleeding within the callus, closed. No surrounding erythema, edema, warmth  The toenails are elongated, hypertrophic, deformed and tender to direct palpation 6-10  Assessment: Closure of plantar skin ulcers right Symptomatic onychomycoses 6-10  Plan: Patient advised to discontinue Silvadene cream and Betadine soaks Continue wearing surgical shoe with Plastizote insole an additional week  Toenails 6-10 are debrided mechanically an electrical without any bleeding  Reappoint 2 months

## 2015-11-24 NOTE — Patient Instructions (Signed)
Discontinue soaks and Silvadene cream on the right foot Continue wear the surgical shoe on the right foot an additional 7 days and then return to your regular shoe Reevaluate 2 months

## 2015-12-07 ENCOUNTER — Encounter: Payer: Self-pay | Admitting: Cardiology

## 2015-12-08 DIAGNOSIS — M1711 Unilateral primary osteoarthritis, right knee: Secondary | ICD-10-CM | POA: Diagnosis not present

## 2015-12-08 DIAGNOSIS — M25561 Pain in right knee: Secondary | ICD-10-CM | POA: Diagnosis not present

## 2015-12-11 DIAGNOSIS — H401112 Primary open-angle glaucoma, right eye, moderate stage: Secondary | ICD-10-CM | POA: Diagnosis not present

## 2015-12-11 DIAGNOSIS — H40012 Open angle with borderline findings, low risk, left eye: Secondary | ICD-10-CM | POA: Diagnosis not present

## 2015-12-15 DIAGNOSIS — J811 Chronic pulmonary edema: Secondary | ICD-10-CM | POA: Diagnosis not present

## 2015-12-15 DIAGNOSIS — I5032 Chronic diastolic (congestive) heart failure: Secondary | ICD-10-CM | POA: Diagnosis not present

## 2015-12-15 DIAGNOSIS — R05 Cough: Secondary | ICD-10-CM | POA: Diagnosis not present

## 2015-12-24 DIAGNOSIS — I509 Heart failure, unspecified: Secondary | ICD-10-CM | POA: Diagnosis not present

## 2015-12-24 DIAGNOSIS — I5032 Chronic diastolic (congestive) heart failure: Secondary | ICD-10-CM | POA: Diagnosis not present

## 2015-12-31 ENCOUNTER — Encounter: Payer: Self-pay | Admitting: Cardiology

## 2015-12-31 DIAGNOSIS — H401112 Primary open-angle glaucoma, right eye, moderate stage: Secondary | ICD-10-CM | POA: Diagnosis not present

## 2016-01-05 ENCOUNTER — Telehealth: Payer: Self-pay | Admitting: *Deleted

## 2016-01-05 NOTE — Telephone Encounter (Signed)
Pt's dtr, Waynetta SandyBeth states pt's foot has opened again and she wondered if pt need to be seen earlier than April 18th.  I left message informing Beth, she should call for pt's earlier appt and continue the betadine soaks and silvadene cream dressing until seen in office.

## 2016-01-07 DIAGNOSIS — E538 Deficiency of other specified B group vitamins: Secondary | ICD-10-CM | POA: Diagnosis not present

## 2016-01-12 ENCOUNTER — Ambulatory Visit (INDEPENDENT_AMBULATORY_CARE_PROVIDER_SITE_OTHER): Payer: Medicare Other | Admitting: Podiatry

## 2016-01-12 ENCOUNTER — Encounter: Payer: Self-pay | Admitting: Podiatry

## 2016-01-12 VITALS — BP 136/76 | HR 68 | Temp 96.1°F | Resp 12

## 2016-01-12 DIAGNOSIS — L89891 Pressure ulcer of other site, stage 1: Secondary | ICD-10-CM

## 2016-01-12 DIAGNOSIS — L97511 Non-pressure chronic ulcer of other part of right foot limited to breakdown of skin: Secondary | ICD-10-CM

## 2016-01-12 NOTE — Progress Notes (Signed)
Patient ID: [name], female   DOB: 05/26/1923, 82 y.o.   MRN: [id number]   Subjective: This patient presents for follow up care follow ulceration cellulitis with symptoms beginning on 09/08/2015. Patient at short course of cephalexin without any difficulty from medication. She is currently wearing a surgical shoe with Plastizote insole and ambulating without difficulty. Wound care includes Betadine soaks and application of Silvadene to plantar skin lesion. The plantar wound on the visit of 11/11/2015 was 5 mm. on the visit of 11/24/2015 the plantar skin ulcer was closed. At that time patient was advised to wear the surgical shoe for an additional week. Today patient presents with daughter and treatment room states that the plantar ulcer on the right foot has recurred approximately 2 weeks ago and the patient's daughter is soaking her mother's foot in diluted Betadine, applying Silvadene cream and gauze dressing and wearing the surgical shoe  Objective: Orientated 3 Oral temperature 96.1 BP 136/76  Plantar second MPJ has hemorrhagic hyperkeratotic tissue that after debridement breaks down to a superficial 7 x 10 mm ulcer with a full dermal ulceration within this lesion measuring 5mm x 2.5 mm with a granular base. The wound does not probe deep there is no surrounding erythema, edema or active drainage  Assessment: Recurrence of plantar ulcer right first MPJ without obvious sign of clinical infection  Plan: Debrided ulcer and packed with Iodosorb gel Advised patient and patient's daughter to Betadine soak right foot daily and apply Silvadene cream to the area. Continue wearing surgical shoe with Plastizote insole on the right foot If patient notices any sudden increased pain, swelling, redness present to the emergency department otherwise reevaluate 7 days

## 2016-01-12 NOTE — Patient Instructions (Signed)
Continued diluted Betadine soaks daily to the right foot times 3-5 minutes Apply Silvadene cream to the skin ulcer on the ball of the right foot daily and cover with gauze Wear surgical shoe on the right foot with Plastizote insole If you notice any sudden increased pain, swelling, redness, drainage present to ED   Reappoint 7 days

## 2016-01-20 ENCOUNTER — Ambulatory Visit (INDEPENDENT_AMBULATORY_CARE_PROVIDER_SITE_OTHER): Payer: Medicare Other | Admitting: Podiatry

## 2016-01-20 ENCOUNTER — Encounter: Payer: Self-pay | Admitting: Podiatry

## 2016-01-20 VITALS — BP 149/72 | HR 71 | Resp 18

## 2016-01-20 DIAGNOSIS — L84 Corns and callosities: Secondary | ICD-10-CM | POA: Diagnosis not present

## 2016-01-20 NOTE — Patient Instructions (Signed)
Continue wearing a surgical shoe on the right foot Apply Silvadene cream daily and cover with gauze to the skin fissure, skin tear from tape and attach with Coflex tape Keep scheduled visit next 7 days for skin a nail debridement

## 2016-01-21 NOTE — Progress Notes (Signed)
Patient ID: Casimer LeekMary C Grullon, female   DOB: 04/19/1923, 82 y.o.   MRN: [id number]  Subjective: This patient presents for follow up care follow ulceration cellulitis with symptoms beginning on 09/08/2015. Patient at short course of cephalexin without any difficulty from medication. She is currently wearing a surgical shoe with Plastizote insole and ambulating without difficulty. Wound care includes Betadine soaks and application of Silvadene to plantar skin lesion. The plantar wound on the visit of 11/11/2015 was 5 mm. plantar skin lesion size on February 2017 was 2 mm   Objective: Orientated 3 Plantar skin lesion right has keratoses with slight bleeding within the callus, closed. No surrounding erythema, edema, warmth. The lesion is closed after debridement Fissures skin tear 7 mm plantar fifth MPJ with granular base without any surrounding erythema edema or drainage or warmth. The toenails are elongated, hypertrophic, deformed and tender to direct palpation 6-10  Assessment: Closure of plantar skin ulcers right Skin fissure fifth MPJ from pulling of paper tape  onychomycoses 6-10  Plan: Patient advised to apply Silvadene Silvadene cream to the skin fissure in the fifth MPJ right and attach with Coflex  DC Betadine soaks DC paper tape Continue wearing surgical shoe with Plastizote insole an additional week Return 7 days for scheduled skin a nail debridement

## 2016-01-26 ENCOUNTER — Ambulatory Visit (INDEPENDENT_AMBULATORY_CARE_PROVIDER_SITE_OTHER): Payer: Medicare Other | Admitting: Podiatry

## 2016-01-26 DIAGNOSIS — B351 Tinea unguium: Secondary | ICD-10-CM

## 2016-01-26 DIAGNOSIS — M79676 Pain in unspecified toe(s): Secondary | ICD-10-CM | POA: Diagnosis not present

## 2016-01-26 NOTE — Patient Instructions (Signed)
Discontinue the use of Betadine soaks and Silvadene cream Continue to wear the surgical shoe with the Plastizote insole on the right foot on ongoing daily basis

## 2016-01-26 NOTE — Progress Notes (Signed)
Patient ID: Casimer LeekMary C Radcliffe, female   DOB: 03/11/1923, 82 y.o.   MRN: [id number]  Subjective: This patient presents for follow up care follow ulceration cellulitis with symptoms beginning on 09/08/2015. Patient at short course of cephalexin without any difficulty from medication. She is currently wearing a surgical shoe with Plastizote insole and ambulating without difficulty. Wound care includes Betadine soaks and application of Silvadene to plantar skin lesion. The plantar wound on the visit of 11/11/2015 was 5 mm. plantar skin lesion size on February 2017 was 2 mm. patient also is complaining of painful toenails on the right and left feet and walking wearing shoes and requests toenail debridement Patient's daughter present to treatment room   Objective: Orientated 3 No open skin lesions bilaterally Plantar skin lesion right has keratoses with slight bleeding within the callus, closed. No surrounding erythema, edema, warmth. The lesion is closed after debridement The toenails are elongated, hypertrophic, deformed and tender to direct palpation 6-10  Assessment: Closure of plantar skin ulcers right without any clinical sign of infection Symptomaticonychomycoses 6-10  Plan: Patient advised to discontinue  Silvadene cream DC Betadine soaks Debridement toenails 6-10 mechanically elected without a bleeding Debrided pre-ulcerative plantar callus right Continue wearing surgical shoe with Plastizote insole an additional week  Reappoint 4 weeks

## 2016-01-28 DIAGNOSIS — H40012 Open angle with borderline findings, low risk, left eye: Secondary | ICD-10-CM | POA: Diagnosis not present

## 2016-01-28 DIAGNOSIS — H26492 Other secondary cataract, left eye: Secondary | ICD-10-CM | POA: Diagnosis not present

## 2016-01-28 DIAGNOSIS — Z961 Presence of intraocular lens: Secondary | ICD-10-CM | POA: Diagnosis not present

## 2016-01-28 DIAGNOSIS — H401112 Primary open-angle glaucoma, right eye, moderate stage: Secondary | ICD-10-CM | POA: Diagnosis not present

## 2016-02-08 DIAGNOSIS — E538 Deficiency of other specified B group vitamins: Secondary | ICD-10-CM | POA: Diagnosis not present

## 2016-02-23 ENCOUNTER — Encounter: Payer: Self-pay | Admitting: Podiatry

## 2016-02-23 ENCOUNTER — Ambulatory Visit (INDEPENDENT_AMBULATORY_CARE_PROVIDER_SITE_OTHER): Payer: Medicare Other | Admitting: Podiatry

## 2016-02-23 VITALS — BP 142/70 | HR 73 | Resp 14

## 2016-02-23 DIAGNOSIS — L84 Corns and callosities: Secondary | ICD-10-CM | POA: Diagnosis not present

## 2016-02-23 NOTE — Progress Notes (Signed)
Patient ID: Paula LeekMary C Curto, female   DOB: 07/14/1923, 82 y.o.   MRN: [id number]  This patient presents for follow up care follow ulceration cellulitis with symptoms beginning on 09/08/2015. Patient at short course of cephalexin without any difficulty from medication. She is currently wearing a surgical shoe with Plastizote insole and ambulating without difficulty. Wound care includes Betadine soaks and application of Silvadene to plantar skin lesion. The plantar wound on the visit of 11/11/2015 was 5 mm. plantar skin lesion size on February 2017 was 2 mm. patient also is complaining of painful toenails on the right and left feet and walking wearing shoes and requests toenail debridement Patient's daughter present to treatment room   Objective: Orientated 3 No open skin lesions bilaterally Plantar skin lesion right has keratoses with slight bleeding within the callus, closed. No surrounding erythema, edema, warmth. The lesion is closed after debridement   Assessment: Closure of plantar skin ulcers right without any clinical sign of infection Pre-ulcerative plantar callus, right foot  Plan: Patient advised to discontinue Silvadene cream DC Betadine soaks Debrided pre-ulcerative plantar callus right Continue wearing surgical shoe with Plastizote insole an additional week  Reappoint 4 weeks

## 2016-02-23 NOTE — Patient Instructions (Signed)
Today the exam demonstrated a bleeding callus on the ball of the right foot that remains closed after debridement, pre-ulcerative callus Continue wearing surgical shoe with Plastizote on the right foot Can apply a nonmedicated pad around the bleeding callus on the right foot

## 2016-02-25 ENCOUNTER — Telehealth: Payer: Self-pay | Admitting: *Deleted

## 2016-02-25 NOTE — Telephone Encounter (Signed)
Pt's dtr, Beth asked if we sold the "u" shaped foot pads Dr. Leeanne Deeduchman placed on her mother's foot.  I told her we did and she said she'll be by tomorrow.

## 2016-02-26 DIAGNOSIS — B351 Tinea unguium: Secondary | ICD-10-CM

## 2016-03-10 DIAGNOSIS — M1711 Unilateral primary osteoarthritis, right knee: Secondary | ICD-10-CM | POA: Diagnosis not present

## 2016-03-22 ENCOUNTER — Ambulatory Visit (INDEPENDENT_AMBULATORY_CARE_PROVIDER_SITE_OTHER): Payer: Medicare Other | Admitting: Podiatry

## 2016-03-22 ENCOUNTER — Encounter: Payer: Self-pay | Admitting: Podiatry

## 2016-03-22 VITALS — BP 133/74 | HR 60 | Resp 12

## 2016-03-22 DIAGNOSIS — Z0000 Encounter for general adult medical examination without abnormal findings: Secondary | ICD-10-CM | POA: Diagnosis not present

## 2016-03-22 DIAGNOSIS — L84 Corns and callosities: Secondary | ICD-10-CM | POA: Diagnosis not present

## 2016-03-22 DIAGNOSIS — M810 Age-related osteoporosis without current pathological fracture: Secondary | ICD-10-CM | POA: Diagnosis not present

## 2016-03-22 DIAGNOSIS — N390 Urinary tract infection, site not specified: Secondary | ICD-10-CM | POA: Diagnosis not present

## 2016-03-22 DIAGNOSIS — E538 Deficiency of other specified B group vitamins: Secondary | ICD-10-CM | POA: Diagnosis not present

## 2016-03-22 DIAGNOSIS — M109 Gout, unspecified: Secondary | ICD-10-CM | POA: Diagnosis not present

## 2016-03-22 DIAGNOSIS — E1122 Type 2 diabetes mellitus with diabetic chronic kidney disease: Secondary | ICD-10-CM | POA: Diagnosis not present

## 2016-03-22 DIAGNOSIS — I5032 Chronic diastolic (congestive) heart failure: Secondary | ICD-10-CM | POA: Diagnosis not present

## 2016-03-22 DIAGNOSIS — Z1389 Encounter for screening for other disorder: Secondary | ICD-10-CM | POA: Diagnosis not present

## 2016-03-22 NOTE — Patient Instructions (Signed)
Continue wearing the surgical shoe on the right foot with the Plastizote insole Wear the universal metatarsal pad on the right foot

## 2016-03-22 NOTE — Progress Notes (Signed)
Patient ID: [name], female   DOB: 08/18/1923, 82 y.o.   MRN: [id number]   This patient presents for follow up care follow ulceration cellulitis with symptoms beginning on 09/08/2015. Patient at short course of cephalexin without any difficulty from medication. She is currently wearing a surgical shoe with Plastizote insole and ambulating without difficulty. Wound care includes Betadine soaks and application of Silvadene to plantar skin lesion. The plantar wound on the visit of 11/11/2015 was 5 mm. plantar skin lesion size on February 2017 was 2 mm. patient also is complaining of painful toenails on the right and left feet and walking wearing shoes and requests toenail debridement Patient's daughter present to treatment room   Objective: Orientated 3 No open skin lesions bilaterally Plantar skin lesion right has keratoses with slight bleeding within the callus, closed. No surrounding erythema, edema, warmth. The lesion is closed after debridement   Assessment: Closure of plantar skin ulcers right without any clinical sign of infection Pre-ulcerative plantar callus, right foot Mycotic toenails  Plan: Debridement toenails 6-10 mechanically Anna E without a bleeding Debrided pre-ulcerative plantar callus right Continue wearing surgical shoe with Plastizote insole an additional week Dispensed 1 Universal metatarsal pad to wear on the right foot to further reduce pressure to the pre-ulcerative area  Reappoint 4 weeks

## 2016-03-29 DIAGNOSIS — I129 Hypertensive chronic kidney disease with stage 1 through stage 4 chronic kidney disease, or unspecified chronic kidney disease: Secondary | ICD-10-CM | POA: Diagnosis not present

## 2016-03-29 DIAGNOSIS — I5032 Chronic diastolic (congestive) heart failure: Secondary | ICD-10-CM | POA: Diagnosis not present

## 2016-03-29 DIAGNOSIS — E1122 Type 2 diabetes mellitus with diabetic chronic kidney disease: Secondary | ICD-10-CM | POA: Diagnosis not present

## 2016-03-29 DIAGNOSIS — I270 Primary pulmonary hypertension: Secondary | ICD-10-CM | POA: Diagnosis not present

## 2016-03-29 DIAGNOSIS — E538 Deficiency of other specified B group vitamins: Secondary | ICD-10-CM | POA: Diagnosis not present

## 2016-04-08 ENCOUNTER — Telehealth: Payer: Self-pay | Admitting: Cardiology

## 2016-04-08 NOTE — Telephone Encounter (Signed)
[facility] and advised to call if questions.

## 2016-04-08 NOTE — Telephone Encounter (Signed)
New Message  [facility] to get latest EF from last ECho and BP /Pulse from last OV. Please call back and discuss.

## 2016-04-26 ENCOUNTER — Encounter: Payer: Self-pay | Admitting: Podiatry

## 2016-04-26 ENCOUNTER — Ambulatory Visit (INDEPENDENT_AMBULATORY_CARE_PROVIDER_SITE_OTHER): Payer: Medicare Other | Admitting: Podiatry

## 2016-04-26 DIAGNOSIS — L84 Corns and callosities: Secondary | ICD-10-CM | POA: Diagnosis not present

## 2016-04-26 DIAGNOSIS — M810 Age-related osteoporosis without current pathological fracture: Secondary | ICD-10-CM | POA: Diagnosis not present

## 2016-04-26 NOTE — Patient Instructions (Signed)
Move Band-Aid on fifth left toe in 1-3 days and apply topical antibiotic ointment and Band-Aid until a scab forms Wear surgical shoe on right foot Wear metatarsal pad on right foot

## 2016-04-26 NOTE — Progress Notes (Signed)
Patient ID: [name], female   DOB: 12/27/1922, 82 y.o.   MRN: [id number]  Subjective: This patient presents for follow-up care for pre-ulcerative plantar callus on the right foot. Patient has a history of this area developing an ulceration with cellulitis and 2016 with ongoing local wound care Patient continues to wear surgical shoe Plastizote insole as well as metatarsal pad The patient's daughters present in the treatment room  Objective: Orientated 3 No open skin lesions bilaterally Reactive bleeding callus plantar right foot that remains closed after debridement Elongated deformed toenails 6-10  Assessment: Pre-ulcerative plantar callus right Elongated, mycotic toenails 6-10 Plan: Debrided toenails 6-10 with slight bleeding distal fifth right toe treated with antibiotic ointment and Band-Aid. Instructed patient and patient's daughter to removed Band-Aid and 1-3 days and continue to apply topical antibiotic ointment daily until a scab forms  Debrided plantar in lesion right without any bleeding Continue to wear surgical shoe Plastizote insole Continue to wear metatarsal pad on right foot  Reappoint 4 weeks

## 2016-05-12 ENCOUNTER — Ambulatory Visit (INDEPENDENT_AMBULATORY_CARE_PROVIDER_SITE_OTHER): Payer: Medicare Other | Admitting: Nurse Practitioner

## 2016-05-12 ENCOUNTER — Encounter: Payer: Self-pay | Admitting: Nurse Practitioner

## 2016-05-12 VITALS — BP 125/72 | HR 71 | Ht 60.0 in | Wt 155.6 lb

## 2016-05-12 DIAGNOSIS — I639 Cerebral infarction, unspecified: Secondary | ICD-10-CM

## 2016-05-12 DIAGNOSIS — I63113 Cerebral infarction due to embolism of bilateral vertebral arteries: Secondary | ICD-10-CM | POA: Diagnosis not present

## 2016-05-12 DIAGNOSIS — G451 Carotid artery syndrome (hemispheric): Secondary | ICD-10-CM | POA: Diagnosis not present

## 2016-05-12 DIAGNOSIS — E785 Hyperlipidemia, unspecified: Secondary | ICD-10-CM | POA: Diagnosis not present

## 2016-05-12 DIAGNOSIS — I10 Essential (primary) hypertension: Secondary | ICD-10-CM

## 2016-05-12 NOTE — Patient Instructions (Addendum)
Continue aspirin 81 mg daily and clopidogrel 75 mg daily  for secondary stroke prevention  Strict control of hypertension with blood pressure goal below 130/90, today's reading 125/72  hemoglobin A1c goal below 6.5% most recent 5.9  lipids with LDL cholesterol goal below 70 mg/dL. most recent 67 diet with plenty of whole grains, cereals, fruits and vegetables, exercise regularly and maintain ideal body weight Follow-up yearly and when necessary

## 2016-05-12 NOTE — Progress Notes (Signed)
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   She returns for followup of her last visit on 01/10/14 with me. She continues to do well from a neurovascular standpoint without any recurrent stroke or TIA symptoms since her initial stroke in December 2013. She is tolerating aspirin well without any side effects. She states her blood pressure is quite good and today it is 112/62 in our office. She has her annual physical scheduled in December at Dr. Benard Halsted office and plans to have labwork then. Carotid Ultrasound on 11/15/13 showed no significant stenosis. She has had no new neurovascular symptoms. Her main deficit from the stroke is weakness in the left leg. She a fall in May and suffered a compression fracture in her spine, a stress fracture in her leg and cracked ribs, which are healing. Her daughter states she tripped and fell off of the front porch. She is using a walker most of the time. Her main complaint is back pain, which she takes aleve for. UPDATE 07/15/15 CMMs.[name], 82 year old female returns for follow-up. 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 She is currently on aspirin 325 daily and has not had further stroke or TIA symptoms since December 2013. Her lipids are followed by Dr. Thea Silversmith. Her last carotid ultrasound on 11/15/2013 was negative for significant stenosis. She had one fall in May without injury. She is using a single-point cane to ambulate. She continues to live in her own home  and fairly independent with activities of daily living. She continues to drive without difficulty. She denies any memory issues. She returns for reevaluation  Update 11/13/2015 : PS She returns for follow-up after last visit accompanied today by her daughter. Patient was admitted in November 2016 with acute myocardial infarction and underwent cardiac stent and following the procedure and some slurred speech lasted 5 minutes. MRI scan of the brain showed tiny embolic infarcts related to the procedure. Echocardiogram showed normal ejection fraction without cardiac source of embolism. Lipid profile showed LDL 82 mg percent. Hemoglobin A1c was 5.9%. Patient recovered quickly. She is doing well without any residual neurological deficits. She remains on aspirin and Plavix and bruises easily but has had no bleeding problems. Her blood pressure is well controlled usually though it is slightly high today and 144/65. She is tolerating Lipitor well without myalgias or muscle aches. She is quite independent in her activities of daily living. She had carotid ultrasound done on 08/05/15 following the last visit which had shown no significant extracranial stenosis. UPDATE 05/12/16 CM. Ms.Early, 82 year old female returns for follow-up. She has a history of  right cortical infarct from small vessel disease in December 2013 . In addition she was admitted in November 2016 with acute myocardial infarction and underwent cardiac stent and following the procedure has some slurred speech which lasted about 5 minutes. MRI of the brain at that time showed tiny embolic infarcts related to the procedure patient recovered quickly and has not had residual neurologic deficits. She remains on aspirin and Plavix. Her blood pressure is well controlled in the office today at 125/72  she remains independent in her own home. She denies myalgias from her Lipitor. She returns for reevaluation  REVIEW OF SYSTEMS: Full 14 system review of systems  performed and notable only for those listed, all others are neg:  Constitutional: neg  Cardiovascular: neg Ear/Nose/Throat: neg  Skin: neg Eyes: neg Respiratory: neg Gastroitestinal: Urinary frequency Hematology/Lymphatic: Easy bruising Endocrine: neg Musculoskeletal: Joint pain Allergy/Immunology: neg Neurological: neg Psychiatric: neg Sleep : neg   ALLERGIES: Allergies  Allergen Reactions  . Meloxicam Hives    HOME MEDICATIONS: Outpatient Medications Prior to Visit  Medication Sig Dispense Refill  . aspirin 81 MG chewable tablet Chew 81 mg by mouth daily.    Marland Kitchen atorvastatin (LIPITOR) 80 MG tablet Take 1 tablet (80 mg total) by mouth daily at 6 PM. 60 tablet 0  . Calcium Carbonate-Vitamin D (CALTRATE 600+D PO) Take 1 tablet by mouth 2 (two) times daily.    . clopidogrel (PLAVIX) 75 MG tablet Take 1 tablet (75 mg total) by mouth daily with breakfast. 60 tablet 0  . latanoprost (XALATAN) 0.005 % ophthalmic solution PLACE 1 DROP INTO RIGHT EYE EVERY DAY  12  . losartan (COZAAR) 50 MG tablet Take 1 tablet (50 mg total) by mouth daily. 60 tablet 0  . metoprolol (LOPRESSOR) 50 MG tablet Take 25 mg by mouth 2 (two) times daily.    . Multiple Vitamins-Minerals (MULTIPLE VITAMINS/WOMENS PO) Take 1 tablet by mouth daily. W/vitamin D    . Polyethyl Glycol-Propyl Glycol (SYSTANE OP) Apply 1 drop to eye 2 (two) times daily.    Marland Kitchen triamcinolone cream (KENALOG) 0.1 % Apply 1 application topically daily as needed. For rash 30 g 1  . cephALEXin (KEFLEX) 500 MG capsule Take 1 capsule (500 mg total) by mouth 2 (two) times daily. (Patient not taking: Reported on 05/12/2016) 14 capsule 0  . fluticasone (FLONASE) 50 MCG/ACT nasal spray Place 2 sprays into both nostrils as needed for allergies.     . silver sulfADIAZINE (SILVADENE) 1 % cream Apply 1 application topically daily. (Patient not taking: Reported on 05/12/2016) 50 g 0   No facility-administered medications prior to visit.     PAST MEDICAL  HISTORY: Past Medical History:  Diagnosis Date  . Anemia   . Arthritis   . Gout, joint   . Hypertension   . NSTEMI (non-ST elevated myocardial infarction) (HCC) 08/2015  . Stroke [facility])     PAST SURGICAL HISTORY: Past Surgical History:  Procedure Laterality Date  . ABDOMINAL HYSTERECTOMY    . BREAST SURGERY     CYST  . CARDIAC CATHETERIZATION N/A 08/18/2015   Procedure: Left Heart Cath and Coronary Angiography;  Surgeon: Lennette Bihari, MD;  Location: [facility] INVASIVE CV LAB;  Service: Cardiovascular;  Laterality: N/A;  . CARDIAC CATHETERIZATION  08/18/2015   Procedure: Coronary Stent Intervention;  Surgeon: Lennette Bihari, MD;  Location: [facility] INVASIVE CV LAB;  Service: Cardiovascular;;  . DILATION AND CURETTAGE OF UTERUS    . EYE SURGERY  both  . HERNIA REPAIR    . JOINT REPLACEMENT  left knee  . REFRACTIVE SURGERY Right 2017    FAMILY HISTORY: Family History  Problem Relation Age of Onset  . Heart disease Mother   . Stroke Mother     SOCIAL HISTORY: Social History   Social History  . Marital status: Divorced    Spouse name: N/A  . Number of children: 3  . Years of education: 12   Occupational History  .  retired   Social History Main Topics  . Smoking status: Never Smoker  . Smokeless tobacco: Never Used  . Alcohol use No  . Drug use: No  . Sexual activity: No   Other Topics Concern  . Not on file   Social History Narrative   Patient is single and lives at home alone.   Retired   Education 12th grade    Right handed   Caffeine  sometimes     PHYSICAL EXAM  Vitals:   05/12/16 0930  BP: 125/72  Pulse: 71  Weight: 155 lb 9.6 oz (70.6 kg)  Height: 5' (1.524 m)   Body mass index is 30.39 kg/m. General: well developed, well nourished, elderly Caucasian female, seated, in no evident distress Head: head normocephalic and atraumatic. Orohparynx benign  Neck: supple with no carotid or supraclavicular bruits  Cardiovascular: regular rate and rhythm,  3/6 systolic murmur Musculoskeletal: no deformity  Skin: Minimal bruising   Vascular: Normal pulses all extremities    Neurological examination  Mental Status: Awake and fully alert. Oriented to place and time. Recent and remote memory intact. Attention span, concentration and fund of knowledge appropriate. Mood and affect appropriate.  Cranial Nerves: Fundoscopic exam deferred. Pupils equal, briskly reactive to light. Extraocular movements full without nystagmus. Visual fields full to confrontation. Decreased hearing.. Facial sensation intact. Face, tongue, palate moves normally and symmetrically.  Motor: Normal bulk and tone. Normal strength in all tested extremity muscles. Diminished fine finger movements on the left. Orbits right over left upper extremity.  Sensory: intact to touch and pinprick and vibratory sensation in the upper and lower extremities.  Coordination: Rapid alternating movements normal in all extremities. Finger-to-nose and heel-to-shin performed accurately bilaterally.  Gait and Station: Arises from chair without difficulty. Stance is wide based. Gait demonstrates normal stride length and balance. Not able to heel, toe and tandem walk without difficulty. Favors left leg. Ambulates with a rolling walker Reflexes: 1+ and symmetric.  DIAGNOSTIC DATA (LABS, IMAGING, TESTING) - I reviewed patient records, labs, notes, testing and imaging myself where available.  Lab Results  Component Value Date   WBC 9.7 08/20/2015   HGB 11.5 (L) 08/20/2015   HCT 37.2 08/20/2015   MCV 92.8 08/20/2015   PLT 249 08/20/2015      Component Value Date/Time   NA 136 08/20/2015 0459   K 4.9 08/20/2015 0459   CL 105 08/20/2015 0459   CO2 25 08/20/2015 0459   GLUCOSE 102 (H) 08/20/2015 0459   BUN 24 (H) 08/20/2015 0459   CREATININE 1.30 (H) 08/20/2015 0459   CALCIUM 9.2 08/20/2015 0459   PROT 6.6 09/25/2012 2357   ALBUMIN 3.4 (L) 09/25/2012 2357   AST 20 09/25/2012 2357   ALT 12  09/25/2012 2357   ALKPHOS 81 09/25/2012 2357   BILITOT 0.2 (L) 09/25/2012 2357   GFRNONAA 35 (L) 08/20/2015 0459   GFRAA 41 (L) 08/20/2015 0459   Lab Results  Component Value Date   CHOL 141 08/20/2015   HDL 47 08/20/2015   LDLCALC 67 08/20/2015   TRIG 136 08/20/2015   CHOLHDL 3.0 08/20/2015   Lab Results  Component Value Date   HGBA1C 5.9 (H) 08/20/2015     ASSESSMENT AND PLAN 82 y.o. year old Caucasian lady with  right subcortical infarct from small vessel disease in December 2013 with vascular risk factors of hyperlipidemia, hypertension, age and sex. Recent small card embolic infarct following cardiac stenting in November 2016 but no lasting neurological deficits. She is  doing well from a neurovascular standpoint.       Continue aspirin 81 mg daily and clopidogrel 75 mg daily  for secondary stroke prevention  Strict control of hypertension with blood pressure goal below 130/90, today's reading 125/72  hemoglobin A1c goal below 6.5% most recent 5.9  lipids with LDL cholesterol goal below 70 mg/dL. most recent 34 diet with plenty of whole grains, cereals, fruits and vegetables, exercise regularly and maintain ideal body weight Stay well hydrated Follow-up yearly and when necessary Nilda Riggs, [facility], [facility], APRN  [facility] [address] [place], [place] [address] [phone]

## 2016-05-17 NOTE — Progress Notes (Signed)
I agree with the above plan 

## 2016-05-24 ENCOUNTER — Ambulatory Visit (INDEPENDENT_AMBULATORY_CARE_PROVIDER_SITE_OTHER): Payer: Medicare Other | Admitting: Podiatry

## 2016-05-24 ENCOUNTER — Encounter: Payer: Self-pay | Admitting: Podiatry

## 2016-05-24 DIAGNOSIS — B351 Tinea unguium: Secondary | ICD-10-CM

## 2016-05-24 DIAGNOSIS — M79676 Pain in unspecified toe(s): Secondary | ICD-10-CM

## 2016-05-24 DIAGNOSIS — L84 Corns and callosities: Secondary | ICD-10-CM

## 2016-05-24 NOTE — Progress Notes (Signed)
Patient ID: Paula LeekMary C [name], female   DOB: 06/26/1923, 82 y.o.   MRN: [id number]    Subjective: This patient presents for follow-up care for pre-ulcerative plantar callus on the right foot. Patient has a history of this area developing an ulceration with cellulitis and 2016 with ongoing local wound care Patient continues to wear surgical shoe Plastizote insole as well as metatarsal pad The patient's daughters present in the treatment room Patient's daughter requests an additional metatarsal pad,Silo to wear in the right foot  Objective: Orientated 3 No open skin lesions bilaterally Reactive bleeding callus plantar right foot that remains closed after debridement Elongated deformed toenails 6-10  Assessment: Pre-ulcerative plantar callus right Elongated, mycotic toenails 6-10 Plan: Debrided toenails 6-10 without any bleeding  Debrided plantar in lesion right without any bleeding Continue to wear surgical shoe Plastizote insole Continue to wear metatarsal pad on right foot  Dispensed an additional metatarsal pad Silo pad for the right foot  Reappoint 4 weeks

## 2016-05-24 NOTE — Patient Instructions (Signed)
Continue to wear surgical shoe on right foot with Plastizote insole Continue to wear additional metatarsal pad on the right foot

## 2016-06-02 DIAGNOSIS — E538 Deficiency of other specified B group vitamins: Secondary | ICD-10-CM | POA: Diagnosis not present

## 2016-06-07 ENCOUNTER — Encounter: Payer: Self-pay | Admitting: Cardiology

## 2016-06-09 DIAGNOSIS — H01131 Eczematous dermatitis of right upper eyelid: Secondary | ICD-10-CM | POA: Diagnosis not present

## 2016-06-09 DIAGNOSIS — H401112 Primary open-angle glaucoma, right eye, moderate stage: Secondary | ICD-10-CM | POA: Diagnosis not present

## 2016-06-09 DIAGNOSIS — H26492 Other secondary cataract, left eye: Secondary | ICD-10-CM | POA: Diagnosis not present

## 2016-06-09 DIAGNOSIS — Z961 Presence of intraocular lens: Secondary | ICD-10-CM | POA: Diagnosis not present

## 2016-06-16 DIAGNOSIS — M7061 Trochanteric bursitis, right hip: Secondary | ICD-10-CM | POA: Diagnosis not present

## 2016-06-16 DIAGNOSIS — M25561 Pain in right knee: Secondary | ICD-10-CM | POA: Diagnosis not present

## 2016-06-16 DIAGNOSIS — M1711 Unilateral primary osteoarthritis, right knee: Secondary | ICD-10-CM | POA: Diagnosis not present

## 2016-06-16 NOTE — Progress Notes (Signed)
HPI: Follow-up coronary artery disease. Carotid Dopplers October 2016 showed no significant stenosis. Admitted November 2016 and ruled in for a non-ST elevation myocardial infarction. Cardiac catheterization revealed a 95% distal RCA. Patient had a drug-eluting stent. Procedure complicated by CVA. Echocardiogram November 2016 showed normal LV function, grade 1 diastolic dysfunction, mild mitral regurgitation, moderate tricuspid regurgitation and moderate to severe pulmonary hypertension. Since last seen, the patient has dyspnea with more extreme activities but not with routine activities. It is relieved with rest. It is not associated with chest pain. There is no orthopnea, PND. There is no syncope or palpitations. There is no exertional chest pain. Occasional mild pedal edema   Current Outpatient Prescriptions  Medication Sig Dispense Refill  . aspirin 81 MG chewable tablet Chew 81 mg by mouth daily.    Marland Kitchen. atorvastatin (LIPITOR) 80 MG tablet Take 1 tablet (80 mg total) by mouth daily at 6 PM. 60 tablet 0  . Calcium Carbonate-Vitamin D (CALTRATE 600+D PO) Take 1 tablet by mouth 2 (two) times daily.    . clopidogrel (PLAVIX) 75 MG tablet Take 1 tablet (75 mg total) by mouth daily with breakfast. 60 tablet 0  . furosemide (LASIX) 20 MG tablet Take 20 mg by mouth daily as needed.     . latanoprost (XALATAN) 0.005 % ophthalmic solution PLACE 1 DROP INTO RIGHT EYE EVERY DAY  12  . losartan (COZAAR) 50 MG tablet Take 1 tablet (50 mg total) by mouth daily. 60 tablet 0  . metoprolol (LOPRESSOR) 50 MG tablet Take 25 mg by mouth 2 (two) times daily.    . Multiple Vitamins-Minerals (MULTIPLE VITAMINS/WOMENS PO) Take 1 tablet by mouth daily. W/vitamin D    . Polyethyl Glycol-Propyl Glycol (SYSTANE OP) Apply 1 drop to eye 2 (two) times daily.    Marland Kitchen. triamcinolone cream (KENALOG) 0.1 % Apply 1 application topically daily as needed. For rash 30 g 1   No current facility-administered medications for this  visit.      Past Medical History:  Diagnosis Date  . Anemia   . Arthritis   . Gout, joint   . Hypertension   . NSTEMI (non-ST elevated myocardial infarction) (HCC) 08/2015  . Stroke [facility](HCC)     Past Surgical History:  Procedure Laterality Date  . ABDOMINAL HYSTERECTOMY    . BREAST SURGERY     CYST  . CARDIAC CATHETERIZATION N/A 08/18/2015   Procedure: Left Heart Cath and Coronary Angiography;  Surgeon: Lennette Biharihomas A Kelly, MD;  Location: [facility] INVASIVE CV LAB;  Service: Cardiovascular;  Laterality: N/A;  . CARDIAC CATHETERIZATION  08/18/2015   Procedure: Coronary Stent Intervention;  Surgeon: Lennette Biharihomas A Kelly, MD;  Location: [facility] INVASIVE CV LAB;  Service: Cardiovascular;;  . DILATION AND CURETTAGE OF UTERUS    . EYE SURGERY  both  . HERNIA REPAIR    . JOINT REPLACEMENT  left knee  . REFRACTIVE SURGERY Right 2017    Social History   Social History  . Marital status: Divorced    Spouse name: N/A  . Number of children: 3  . Years of education: 12   Occupational History  .      retired   Social History Main Topics  . Smoking status: Never Smoker  . Smokeless tobacco: Never Used  . Alcohol use No  . Drug use: No  . Sexual activity: No   Other Topics Concern  . Not on file   Social History Narrative   Patient is single and  lives at home alone.   Retired   Education 12th grade    Right handed   Caffeine  sometimes    Family History  Problem Relation Age of Onset  . Heart disease Mother   . Stroke Mother     ROS: Some pain in her right foot but no fevers or chills, productive cough, hemoptysis, dysphasia, odynophagia, melena, hematochezia, dysuria, hematuria, rash, seizure activity, orthopnea, PND, claudication. Remaining systems are negative.  Physical Exam: Well-developed well-nourished in no acute distress.  Skin is warm and dry.  HEENT is normal.  Neck is supple.  Chest is clear to auscultation with normal expansion.  Cardiovascular exam is regular rate and  rhythm.  Abdominal exam nontender or distended. No masses palpated. Extremities show 1 + ankle edema. neuro grossly intact  ECG-Sinus rhythm at a rate of 67. No ST changes.  A/P  1 Hypertension-blood pressure controlled. Continue present medications. Check potassium and renal function.  2 hyperlipidemia-continue statin. Check lipids and liver.  3 coronary artery disease-Continue aspirin and statin. We will discontinue Plavix December 2017.  Olga Millers, MD

## 2016-06-21 ENCOUNTER — Ambulatory Visit (INDEPENDENT_AMBULATORY_CARE_PROVIDER_SITE_OTHER): Payer: Medicare Other | Admitting: Podiatry

## 2016-06-21 ENCOUNTER — Encounter: Payer: Self-pay | Admitting: Podiatry

## 2016-06-21 DIAGNOSIS — L84 Corns and callosities: Secondary | ICD-10-CM

## 2016-06-21 DIAGNOSIS — B351 Tinea unguium: Secondary | ICD-10-CM

## 2016-06-21 DIAGNOSIS — M79676 Pain in unspecified toe(s): Secondary | ICD-10-CM

## 2016-06-21 NOTE — Progress Notes (Signed)
Patient ID: Paula LeekMary C [name], female   DOB: 03/07/1923, 82 y.o.   MRN: [id number]    Subjective: This patient presents for follow-up care for pre-ulcerative plantar callus on the right foot. Patient has a history of this area developing an ulceration with cellulitis and 2016 with ongoing local wound care Patient continues to wear surgical shoe Plastizote insole as well as metatarsal pad The patient's daughters present in the treatment room Patient's daughter requests an additional metatarsal pad,Silo to wear in the right foot  Objective: Orientated 3 No open skin lesions bilaterally Reactive bleeding callus plantar right foot that remains closed after debridement Elongated deformed toenails 6-10  Assessment: Pre-ulcerative plantar callus right Elongated, mycotic toenails 6-10 Plan: Debrided toenails 6-10 without any bleeding  Debrided plantar in lesion right without any bleeding Continue to wear surgical shoe Plastizote insole Continue to wear metatarsal pad on right foot  Reappoint 4 weeks

## 2016-06-21 NOTE — Patient Instructions (Signed)
Wear the surgical shoe with Plastizote insole right Wear the metatarsal pad on the right foot

## 2016-06-23 ENCOUNTER — Encounter: Payer: Self-pay | Admitting: Cardiology

## 2016-06-23 ENCOUNTER — Ambulatory Visit (INDEPENDENT_AMBULATORY_CARE_PROVIDER_SITE_OTHER): Payer: Medicare Other | Admitting: Cardiology

## 2016-06-23 VITALS — BP 148/76 | HR 67 | Ht 60.0 in | Wt 153.4 lb

## 2016-06-23 DIAGNOSIS — I10 Essential (primary) hypertension: Secondary | ICD-10-CM | POA: Diagnosis not present

## 2016-06-23 DIAGNOSIS — E785 Hyperlipidemia, unspecified: Secondary | ICD-10-CM

## 2016-06-23 DIAGNOSIS — I2510 Atherosclerotic heart disease of native coronary artery without angina pectoris: Secondary | ICD-10-CM | POA: Diagnosis not present

## 2016-06-23 LAB — BASIC METABOLIC PANEL
BUN: 34 mg/dL — AB (ref 7–25)
CO2: 22 mmol/L (ref 20–31)
CREATININE: 1.06 mg/dL — AB (ref 0.60–0.88)
Calcium: 9.2 mg/dL (ref 8.6–10.4)
Chloride: 108 mmol/L (ref 98–110)
Glucose, Bld: 79 mg/dL (ref 65–99)
Potassium: 5 mmol/L (ref 3.5–5.3)
Sodium: 138 mmol/L (ref 135–146)

## 2016-06-23 LAB — LIPID PANEL
CHOL/HDL RATIO: 2.2 ratio (ref <=5.0)
Cholesterol: 145 mg/dL (ref 125–200)
HDL: 66 mg/dL (ref >=46)
LDL Cholesterol: 50 mg/dL (ref <130)
TRIGLYCERIDES: 147 mg/dL (ref <150)
VLDL: 29 mg/dL (ref <30)

## 2016-06-23 LAB — HEPATIC FUNCTION PANEL
ALK PHOS: 80 U/L (ref 33–130)
ALT: 13 U/L (ref 6–29)
AST: 18 U/L (ref 10–35)
Albumin: 4.1 g/dL (ref 3.6–5.1)
BILIRUBIN INDIRECT: 0.3 mg/dL (ref 0.2–1.2)
Bilirubin, Direct: 0.1 mg/dL (ref <=0.2)
TOTAL PROTEIN: 6.4 g/dL (ref 6.1–8.1)
Total Bilirubin: 0.4 mg/dL (ref 0.2–1.2)

## 2016-06-23 NOTE — Patient Instructions (Signed)
Medication Instructions:   STOP PLAVIX ON December 1ST  Labwork:  Your physician recommends that you HAVE LAB WORK TODAY  Follow-Up:  Your physician wants you to follow-up in: ONE YEAR WITH DR Shelda PalRENSHAW You will receive a reminder letter in the mail two months in advance. If you don't receive a letter, please call our office to schedule the follow-up appointment.   If you need a refill on your cardiac medications before your next appointment, please call your pharmacy.

## 2016-07-07 DIAGNOSIS — E538 Deficiency of other specified B group vitamins: Secondary | ICD-10-CM | POA: Diagnosis not present

## 2016-07-07 DIAGNOSIS — Z23 Encounter for immunization: Secondary | ICD-10-CM | POA: Diagnosis not present

## 2016-07-19 ENCOUNTER — Ambulatory Visit (INDEPENDENT_AMBULATORY_CARE_PROVIDER_SITE_OTHER): Payer: Medicare Other | Admitting: Podiatry

## 2016-07-19 ENCOUNTER — Encounter: Payer: Self-pay | Admitting: Podiatry

## 2016-07-19 VITALS — BP 172/77 | HR 68 | Resp 14

## 2016-07-19 DIAGNOSIS — B351 Tinea unguium: Secondary | ICD-10-CM

## 2016-07-19 DIAGNOSIS — M79676 Pain in unspecified toe(s): Secondary | ICD-10-CM

## 2016-07-19 DIAGNOSIS — L84 Corns and callosities: Secondary | ICD-10-CM | POA: Diagnosis not present

## 2016-07-19 NOTE — Patient Instructions (Signed)
Wear the padded surgical shoe on the right foot Wear the metatarsal pad on the right foot I will extend visits from 4-6 weeks

## 2016-07-19 NOTE — Progress Notes (Signed)
Patient ID: Paula LeekMary C Hitch, female   DOB: 07/02/1923, 82 y.o.   MRN: [id number]    Subjective: This patient presents for follow-up care for pre-ulcerative plantar callus on the right foot. Patient has a history of this area developing an ulceration with cellulitis and 2016 with ongoing local wound care Patient continues to wear surgical shoe Plastizote insole as well as metatarsal pad The patient's daughters present in the treatment room Patient's daughter requests an additional metatarsal pad,Silo to wear in the right foot  Objective: Orientated 3 No open skin lesions bilaterally Reactive bleeding callus plantar right foot that remains closed after debridement Elongated deformed toenails 6-10  Assessment: Pre-ulcerative plantar callus right Elongated, mycotic toenails 6-10 Plan: Debrided toenails 6-10 without any bleeding Lesions or nail stable at 4 weeks will extend intervals between debridement to 6 weeks  Plan: Debrided plantar in lesion right without any bleeding Continue to wear surgical shoe Plastizote insole Continue to wear metatarsal pad on right foot  Reappoint 6 weeks

## 2016-08-09 ENCOUNTER — Encounter: Payer: Self-pay | Admitting: Podiatry

## 2016-08-11 DIAGNOSIS — E538 Deficiency of other specified B group vitamins: Secondary | ICD-10-CM | POA: Diagnosis not present

## 2016-08-30 ENCOUNTER — Ambulatory Visit (INDEPENDENT_AMBULATORY_CARE_PROVIDER_SITE_OTHER): Payer: Medicare Other | Admitting: Podiatry

## 2016-08-30 ENCOUNTER — Encounter: Payer: Self-pay | Admitting: Podiatry

## 2016-08-30 VITALS — BP 134/73 | HR 64 | Resp 16

## 2016-08-30 DIAGNOSIS — L84 Corns and callosities: Secondary | ICD-10-CM

## 2016-08-30 DIAGNOSIS — B351 Tinea unguium: Secondary | ICD-10-CM

## 2016-08-30 DIAGNOSIS — M79676 Pain in unspecified toe(s): Secondary | ICD-10-CM

## 2016-08-30 NOTE — Progress Notes (Signed)
   Subjective:    Patient ID: Paula LeekMary C [name], female    DOB: 06/30/1923, 82 y.o.   MRN: [id number]  HPI    Review of Systems  Cardiovascular: Positive for leg swelling.  All other systems reviewed and are negative.      Objective:   Physical Exam        Assessment & Plan:

## 2016-08-30 NOTE — Patient Instructions (Signed)
Continue wear surgical shoe with Plastizote insole on the right Wear protective metatarsal pad on the right Apply nonmedicated foam pad around the end of the third left toe and attach with Coflex tape do not overtighten

## 2016-08-30 NOTE — Progress Notes (Signed)
Patient ID: Paula LeekMary C [name], female   DOB: 10/03/1923, 82 y.o.   MRN: [id number]   Subjective: This patient presents for follow-up care for pre-ulcerative plantar callus on the right foot. Patient has a history of this area developing an ulceration with cellulitis and 2016 with ongoing local wound care Patient continues to wear surgical shoe Plastizote insole as well as metatarsal pad The patient's daughters present in the treatment room   Objective: Orientated 3 Pitting edema bilaterally DP and PT pulses 1/4 bilaterally Capillary reflex immediate bilaterally Sensation to 10 g monofilament wire intact 3/5 right 2/5 left Vibratory sensation nonreactive bilaterally Ankle reflex equal reactive bilaterally Large well-organized callus distal third left toe, pre-ulcerative HAV right Hammertoe second right Slow to propulsive gait requiring roller walker No open skin lesions bilaterally Reactive bleeding callus plantar right foot that remains closed after debridement Elongated, deformed, brittle,  toenails 6-10  Assessment: Pre-ulcerative plantar callus right and distal third left toe Elongated, mycotic toenails 6-10  Plan: Debrided toenails 6-10 without any bleeding Debride plantar callus right and distal callus third left without any bleeding Apply foam pad distal third left toe in instructed patient's daughter to continue applying foam pad daily Continue to wear surgical shoe Plastizote insole Continue to wear metatarsal pad on right foot  Reappoint 6 weeks

## 2016-09-15 DIAGNOSIS — M1711 Unilateral primary osteoarthritis, right knee: Secondary | ICD-10-CM | POA: Diagnosis not present

## 2016-09-16 DIAGNOSIS — E559 Vitamin D deficiency, unspecified: Secondary | ICD-10-CM | POA: Diagnosis not present

## 2016-09-16 DIAGNOSIS — I129 Hypertensive chronic kidney disease with stage 1 through stage 4 chronic kidney disease, or unspecified chronic kidney disease: Secondary | ICD-10-CM | POA: Diagnosis not present

## 2016-09-16 DIAGNOSIS — M109 Gout, unspecified: Secondary | ICD-10-CM | POA: Diagnosis not present

## 2016-09-16 DIAGNOSIS — E538 Deficiency of other specified B group vitamins: Secondary | ICD-10-CM | POA: Diagnosis not present

## 2016-09-16 DIAGNOSIS — M810 Age-related osteoporosis without current pathological fracture: Secondary | ICD-10-CM | POA: Diagnosis not present

## 2016-09-16 DIAGNOSIS — E1122 Type 2 diabetes mellitus with diabetic chronic kidney disease: Secondary | ICD-10-CM | POA: Diagnosis not present

## 2016-09-17 IMAGING — MR MR HEAD W/O CM
8 of 10 series · 35 of 48 positions shown · non-contrast
Comparison: CT head from the same day.  MRI brain 09/25/2012.

ADDENDUM:
These results were called by telephone at the time of interpretation
on 08/19/2015 at [DATE] to Dr. TAT IEONG, who verbally acknowledged
these results.
CLINICAL DATA: Heart catheterization yesterday. Episode today of
increased confusion and expressive aphasia. Symptoms have since
resolved.

EXAM:
MRI HEAD WITHOUT CONTRAST
TECHNIQUE: Multiplanar, multiecho pulse sequences of the brain and surrounding
structures were obtained without intravenous contrast.

[Series 3: DWI · axial · 3.0mm · 1.09mm/px · z∈[-41,+90]mm · 9 of 90 slices shown (1 of 4)]
[im 1/90]
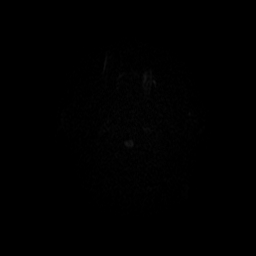
[im 12/90]
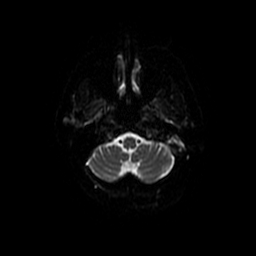
[im 23/90]
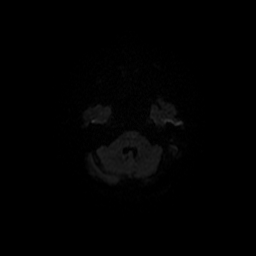
[im 34/90]
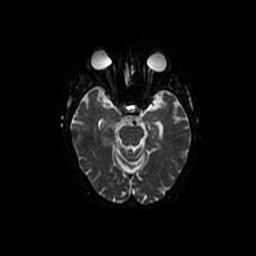
[im 45/90]
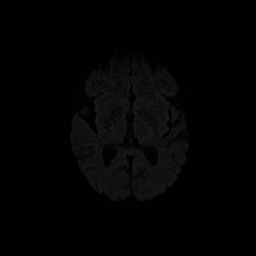
[im 56/90]
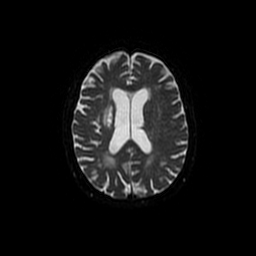
[im 67/90]
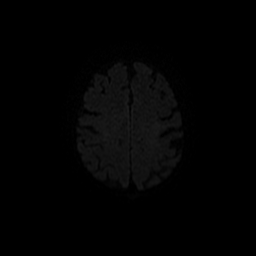
[im 78/90]
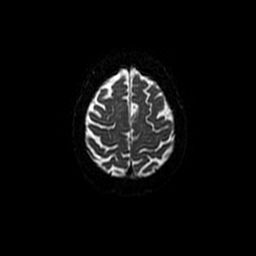
[im 90/90]
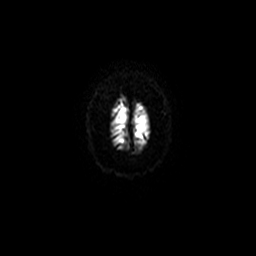

[Series 4: T1 · sagittal · 5.0mm · 0.47mm/px · 2 of 23 slices shown]
[im 1/23]
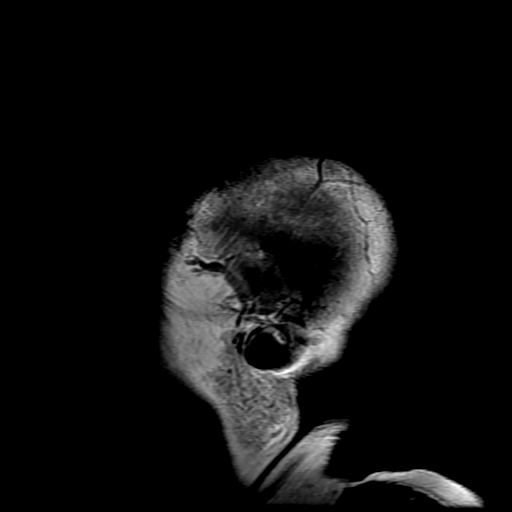
[im 23/23]
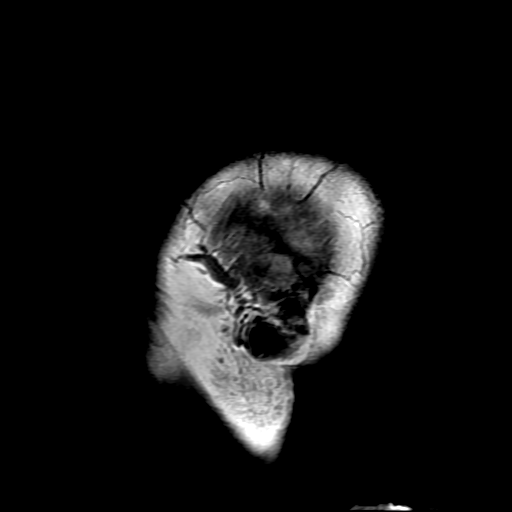

[Series 5: T2 · axial · 5.0mm · 0.43mm/px · z∈[-47,+83]mm · 3 of 23 slices shown (1 of 2)]
[im 1/23]
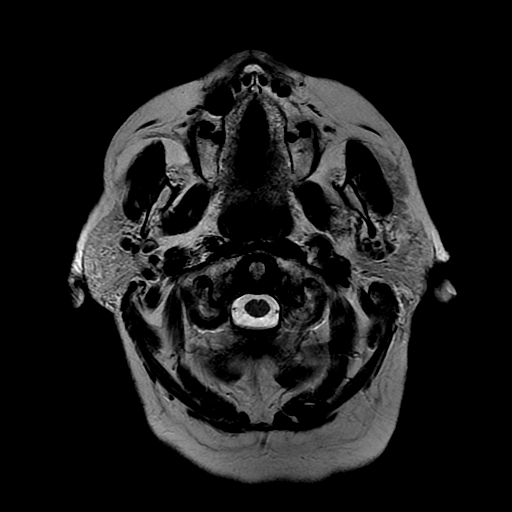
[im 12/23]
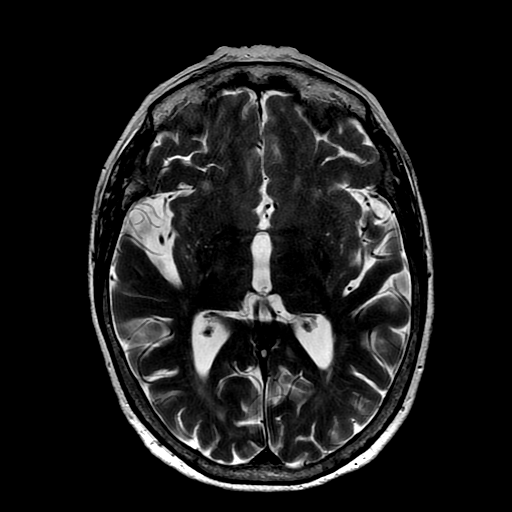
[im 23/23]
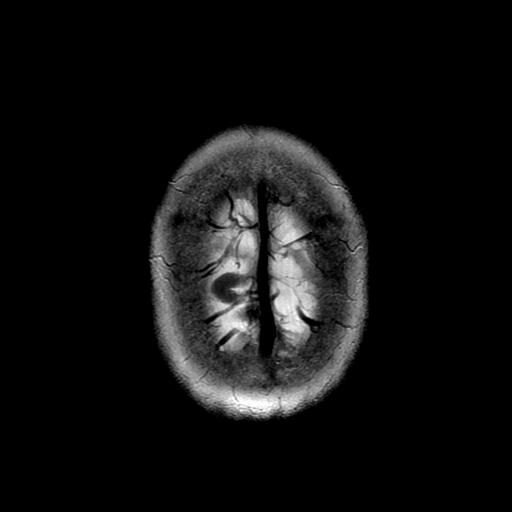

[Series 6: DWI · coronal · 5.0mm · 1.09mm/px · 7 of 64 slices shown (2 of 4)]
[im 1/64]
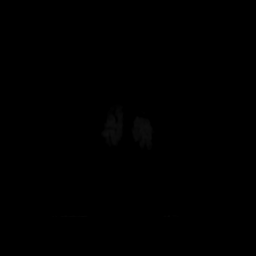
[im 11/64]
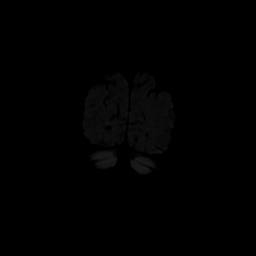
[im 22/64]
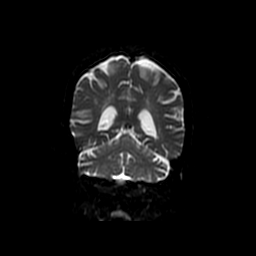
[im 32/64]
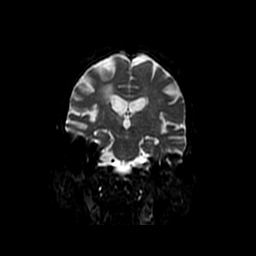
[im 43/64]
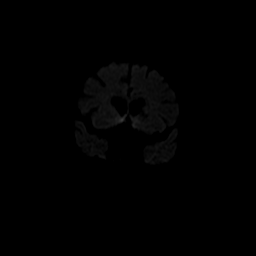
[im 53/64]
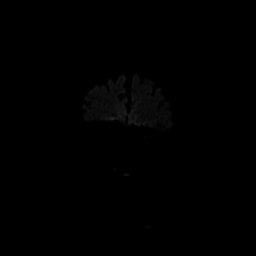
[im 64/64]
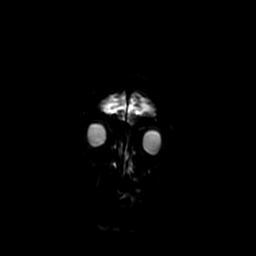

[Series 7: FLAIR · axial · 5.0mm · 0.43mm/px · z∈[-47,+83]mm · 3 of 23 slices shown]
[im 1/23]
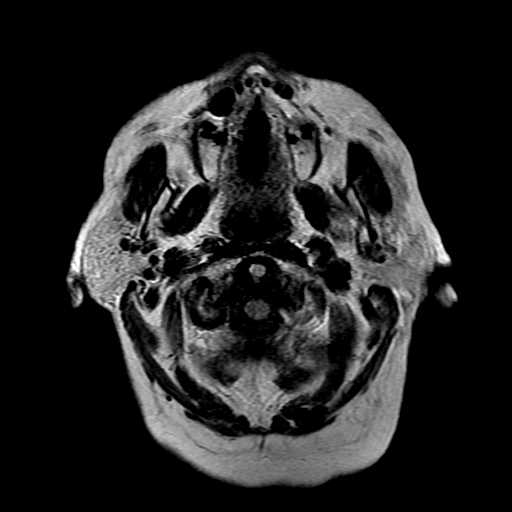
[im 12/23]
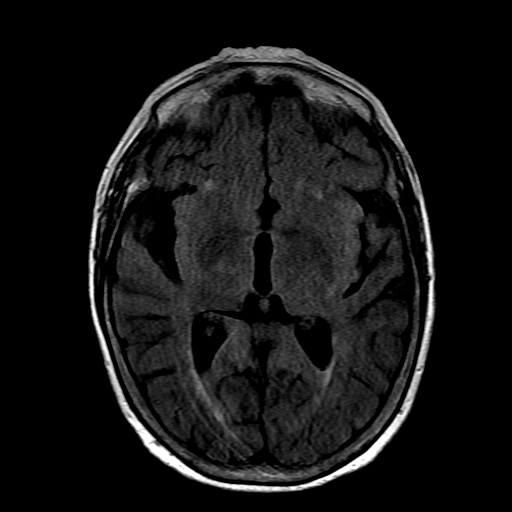
[im 23/23]
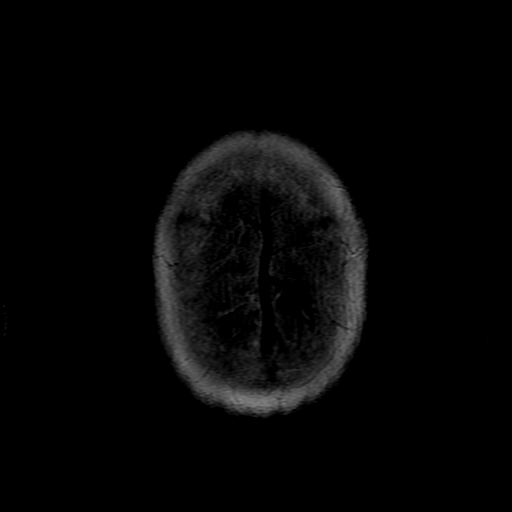

[Series 10: T2 · coronal · 5.0mm · 0.39mm/px · 3 of 24 slices shown (2 of 2)]
[im 1/24]
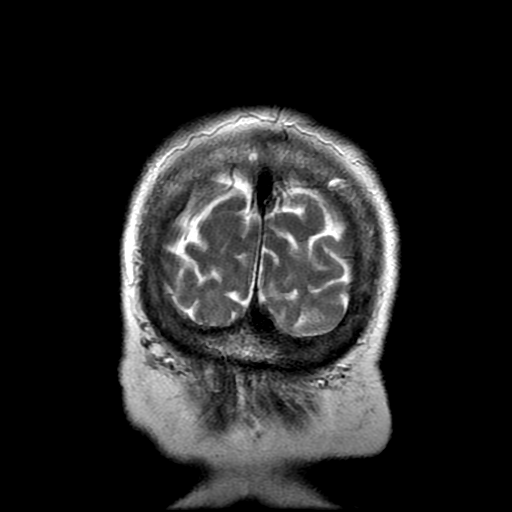
[im 12/24]
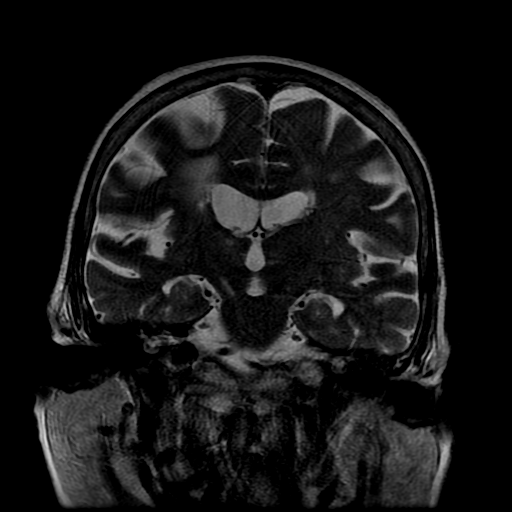
[im 24/24]
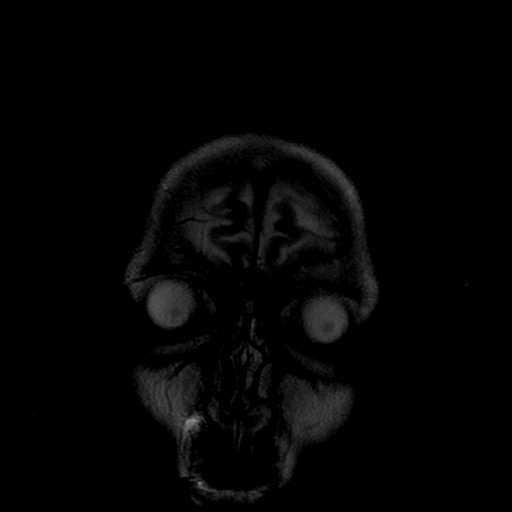

[Series 300: DWI · axial · 3.0mm · 1.09mm/px · z∈[-41,+90]mm · 5 of 45 slices shown (3 of 4)]
[im 1/45]
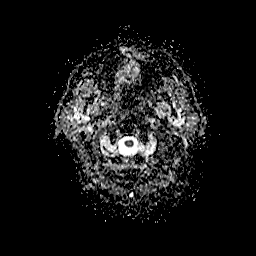
[im 12/45]
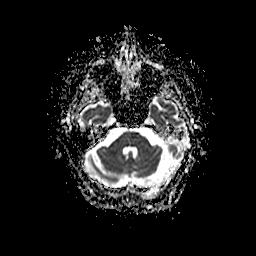
[im 23/45]
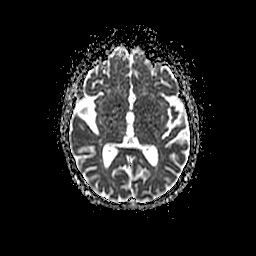
[im 34/45]
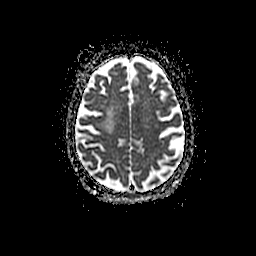
[im 45/45]
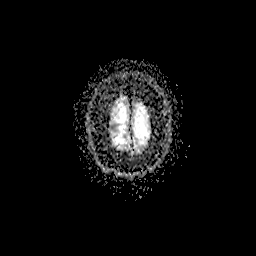

[Series 600: DWI · coronal · 5.0mm · 1.09mm/px · 3 of 32 slices shown (4 of 4)]
[im 1/32]
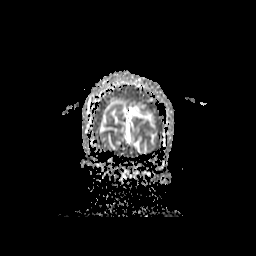
[im 16/32]
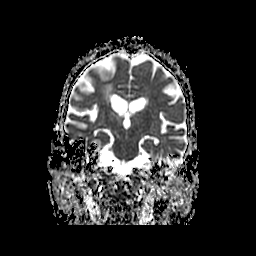
[im 32/32]
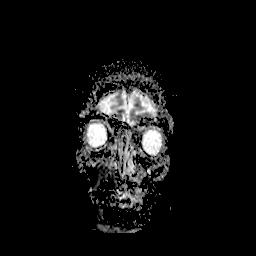

[35 of 48 positions shown; findings below may reference images not displayed]

FINDINGS: The diffusion-weighted images demonstrate acute nonhemorrhagic 7 mm
infarct in the right paramedian vermis. There is a punctate area of
restricted diffusion in the inferior left cerebellum as well. T2
signal associated with the right paramedian infarct.

A remote infarct of the right basal ganglia and centrum semi ovale
are again noted. A remote lacunar infarct is present in the left
caudate head. There is wallerian degeneration along the cortical
spinal right cortical spinal tracts and including the right cerebral
peduncle.

Atrophy and diffuse white matter changes are noted bilaterally
without significant change otherwise.

Flow is present in the major intracranial arteries. Bilateral lens
replacements are noted. The globes and orbits are otherwise intact.
The the paranasal sinuses are clear. A chronic left mastoid effusion
is present. No obstructing nasopharyngeal lesion is present.

Degenerative changes are present in the upper cervical spine.
Midline images are otherwise unremarkable.
IMPRESSION: 1. Acute nonhemorrhagic 7 mm infarct in the right paramedian vermis.
2. Acute nonhemorrhagic punctate infarct in the inferior left
cerebellum.
3. Evolution of previously noted right basal ganglia and centrum
semi of Arnold infarct with associated will layering degeneration.
4. Diffuse white matter disease is otherwise stable. Lacunar infarct
of the left caudate head and is not acute but is new since the prior
exam.

## 2016-09-27 DIAGNOSIS — I5032 Chronic diastolic (congestive) heart failure: Secondary | ICD-10-CM | POA: Diagnosis not present

## 2016-09-27 DIAGNOSIS — R7989 Other specified abnormal findings of blood chemistry: Secondary | ICD-10-CM | POA: Diagnosis not present

## 2016-09-27 DIAGNOSIS — R350 Frequency of micturition: Secondary | ICD-10-CM | POA: Diagnosis not present

## 2016-09-27 DIAGNOSIS — E1122 Type 2 diabetes mellitus with diabetic chronic kidney disease: Secondary | ICD-10-CM | POA: Diagnosis not present

## 2016-09-27 DIAGNOSIS — N183 Chronic kidney disease, stage 3 (moderate): Secondary | ICD-10-CM | POA: Diagnosis not present

## 2016-09-27 DIAGNOSIS — N390 Urinary tract infection, site not specified: Secondary | ICD-10-CM | POA: Diagnosis not present

## 2016-09-27 DIAGNOSIS — I129 Hypertensive chronic kidney disease with stage 1 through stage 4 chronic kidney disease, or unspecified chronic kidney disease: Secondary | ICD-10-CM | POA: Diagnosis not present

## 2016-10-07 DIAGNOSIS — N390 Urinary tract infection, site not specified: Secondary | ICD-10-CM | POA: Diagnosis not present

## 2016-10-11 ENCOUNTER — Encounter: Payer: Self-pay | Admitting: Podiatry

## 2016-10-11 ENCOUNTER — Ambulatory Visit (INDEPENDENT_AMBULATORY_CARE_PROVIDER_SITE_OTHER): Payer: Medicare Other | Admitting: Podiatry

## 2016-10-11 VITALS — BP 138/73 | HR 76 | Resp 14

## 2016-10-11 DIAGNOSIS — L84 Corns and callosities: Secondary | ICD-10-CM | POA: Diagnosis not present

## 2016-10-11 DIAGNOSIS — B351 Tinea unguium: Secondary | ICD-10-CM

## 2016-10-11 NOTE — Progress Notes (Signed)
Patient ID: Paula LeekMary C [name], female   DOB: 10/23/1922, 83 y.o.   MRN: [id number]    Subjective: This patient presents for follow-up care for pre-ulcerative plantar callus on the right foot. Patient has a history of this area developing an ulceration with cellulitis and 2016 with ongoing local wound care Patient continues to wear surgical shoe Plastizote insole as well as metatarsal pad the patient's daughter states that she noticed some drainage on the dorsal second right toe in the past 5 days, however, has improved with application of topical medication. Patient currently completing Cipro for urinary tract infection The patient's daughters present in the treatment room   Objective: Orientated 3 Pitting edema bilaterally DP and PT pulses 1/4 bilaterally Capillary reflex immediate bilaterally Sensation to 10 g monofilament wire intact 3/5 right 2/5 left Vibratory sensation nonreactive bilaterally Ankle reflex equal reactive bilaterally Large well-organized callus distal third left toe, pre-ulcerative HAV right Hammertoe second right with eschar and low-grade erythema on the dorsal aspect of right second toe. There is no drainage, warmth, edema in or around the second right toe Slow to propulsive gait requiring roller walker No open skin lesions bilaterally Reactive bleeding callus plantar right foot that remains closed after debridement Elongated, deformed, brittle,  toenails 6-10  Assessment: Friction irritation second right toe from toe portion of metatarsal pad Pre-ulcerative plantar callus right and distal third left toe Elongated, mycotic toenails 6-10  Plan: Debrided toenails 6-10 without any bleeding Debride plantar callus right and distal callus third left without any bleeding Do not wear metatarsal pad on right foot Apply topical antibiotic ointment and light dressing the second right toe daily until healed Patient and patient's daughter instructed observe this area if no  70 cm of pain, swelling, redness present to ED or contact office  Continue to wear surgical shoe Plastizote insole Continue to wear metatarsal pad on right foot  Reappoint 6 weeks

## 2016-10-11 NOTE — Patient Instructions (Signed)
Apply topical antibiotic ointment to the second right toe with a small gauze dressing. If you notice any sudden increase in pain, swelling, redness, fever in around this area present to emergency department contact our office immediately Continue wear surgical shoe on right foot Do not wear the metatarsal pad on the right foot as it is irritating the second right toe

## 2016-10-20 DIAGNOSIS — E538 Deficiency of other specified B group vitamins: Secondary | ICD-10-CM | POA: Diagnosis not present

## 2016-11-24 DIAGNOSIS — E538 Deficiency of other specified B group vitamins: Secondary | ICD-10-CM | POA: Diagnosis not present

## 2016-11-29 ENCOUNTER — Ambulatory Visit (INDEPENDENT_AMBULATORY_CARE_PROVIDER_SITE_OTHER): Payer: Medicare Other | Admitting: Podiatry

## 2016-11-29 VITALS — BP 99/47 | HR 66

## 2016-11-29 DIAGNOSIS — L84 Corns and callosities: Secondary | ICD-10-CM | POA: Diagnosis not present

## 2016-11-29 DIAGNOSIS — B351 Tinea unguium: Secondary | ICD-10-CM

## 2016-11-29 DIAGNOSIS — M79676 Pain in unspecified toe(s): Secondary | ICD-10-CM | POA: Diagnosis not present

## 2016-11-29 NOTE — Progress Notes (Signed)
Patient ID: Paula LeekMary C [name], female   DOB: 10/07/1923, 83 y.o.   MRN: [id number]   Subjective: This patient presents for follow-up care for pre-ulcerative plantar callus on the right foot. Patient has a history of this area developing an ulceration with cellulitis and 2016 with ongoing local wound care. Also complaining of uncomfortable toenails and requesting nail debridement    Objective: Orientated 3 Pitting edema bilaterally DP and PT pulses 1/4 bilaterally Capillary reflex immediate bilaterally Sensation to 10 g monofilament wire intact 3/5 right 2/5 left Vibratory sensation nonreactive bilaterally Ankle reflex equal reactive bilaterally Large well-organized callus distal third left toe, pre-ulcerative HAV right Hammertoe second right  with no drainage, warmth, edema in or around the second right toe Slow to propulsive gait requiring roller walker No open skin lesions bilaterally Reactive bleeding callus plantar right foot that remains closed after debridement Elongated,deformed,brittle, toenails 6-10  Assessment: Symptomatic onychomycoses 6-10 Pre-ulcerative plantar callus right and distal third left  Plan: Debridement of toenails 6-10 mechanically and left without any bleeding Debrided plantar callus and distal third left lesion without any bleeding Wear surgical shoe on right foot  Reappoint 6 weeks

## 2016-11-29 NOTE — Patient Instructions (Signed)
Wear the surgical shoe with a Plastizote insole on the right foot on an ongoing continuous daily basis

## 2016-12-08 ENCOUNTER — Encounter: Payer: Self-pay | Admitting: Podiatry

## 2016-12-15 DIAGNOSIS — H40012 Open angle with borderline findings, low risk, left eye: Secondary | ICD-10-CM | POA: Diagnosis not present

## 2016-12-15 DIAGNOSIS — H401112 Primary open-angle glaucoma, right eye, moderate stage: Secondary | ICD-10-CM | POA: Diagnosis not present

## 2016-12-15 DIAGNOSIS — H26492 Other secondary cataract, left eye: Secondary | ICD-10-CM | POA: Diagnosis not present

## 2016-12-22 DIAGNOSIS — M1712 Unilateral primary osteoarthritis, left knee: Secondary | ICD-10-CM | POA: Diagnosis not present

## 2016-12-22 DIAGNOSIS — M1711 Unilateral primary osteoarthritis, right knee: Secondary | ICD-10-CM | POA: Diagnosis not present

## 2016-12-29 DIAGNOSIS — E538 Deficiency of other specified B group vitamins: Secondary | ICD-10-CM | POA: Diagnosis not present

## 2017-01-10 ENCOUNTER — Encounter: Payer: Self-pay | Admitting: Sports Medicine

## 2017-01-10 ENCOUNTER — Ambulatory Visit (INDEPENDENT_AMBULATORY_CARE_PROVIDER_SITE_OTHER): Payer: Medicare Other | Admitting: Sports Medicine

## 2017-01-10 DIAGNOSIS — L84 Corns and callosities: Secondary | ICD-10-CM

## 2017-01-10 DIAGNOSIS — M79676 Pain in unspecified toe(s): Secondary | ICD-10-CM | POA: Diagnosis not present

## 2017-01-10 DIAGNOSIS — B351 Tinea unguium: Secondary | ICD-10-CM

## 2017-01-10 NOTE — Progress Notes (Signed)
Subjective: [name] is a 83 y.o. female patient seen today in office with complaint of painful callus with past history of ulceration and thickened and elongated toenails; unable to trim. Patient denies history of Diabetes, Neuropathy, or Vascular disease. Patient has no other pedal complaints at this time.   Patient Active Problem List   Diagnosis Date Noted  . Cardioembolic stroke ([place]) 52/84/1324  . Coronary artery disease 11/12/2015  . Hyperlipidemia 11/12/2015  . TIA (transient ischemic attack)   . Cerebrovascular accident (CVA) due to bilateral embolism of vertebral arteries ([place])   . NSTEMI (non-ST elevated myocardial infarction) ([place]) 08/18/2015  . History of stroke 07/15/2015  . Acute, but ill-defined, cerebrovascular disease ([place]) 07/15/2015  . CVA (cerebral infarction) 09/26/2012  . 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 Fall at home 09/26/2012  . UTI (lower urinary tract infection) 09/26/2012  . Hyperkalemia 09/26/2012  . CKD (chronic kidney disease), stage III 09/26/2012  . Hypertension     Current Outpatient Prescriptions on File Prior to Visit  Medication Sig Dispense Refill  . aspirin 81 MG chewable tablet Chew 81 mg by mouth daily.    Marland Kitchen atorvastatin (LIPITOR) 80 MG tablet Take 1 tablet (80 mg total) by mouth daily at 6 PM. 60 tablet 0  . Calcium Carbonate-Vitamin D (CALTRATE 600+D PO) Take 1 tablet by mouth 2 (two) times daily.    . clopidogrel (PLAVIX) 75 MG tablet Take 75 mg by mouth daily.    . furosemide (LASIX) 20 MG tablet Take 20 mg by mouth daily as needed.     . latanoprost (XALATAN) 0.005 % ophthalmic solution PLACE 1 DROP INTO RIGHT EYE EVERY DAY  12  . losartan (COZAAR) 50 MG tablet Take 1 tablet (50 mg total) by mouth daily. 60 tablet 0  . metoprolol (LOPRESSOR) 50 MG tablet Take 25 mg by mouth 2 (two) times daily.    . Multiple Vitamins-Minerals (MULTIPLE VITAMINS/WOMENS PO) Take 1 tablet by mouth daily. W/vitamin D    . Polyethyl Glycol-Propyl  Glycol (SYSTANE OP) Apply 1 drop to eye 2 (two) times daily.    Marland Kitchen triamcinolone cream (KENALOG) 0.1 % Apply 1 application topically daily as needed. For rash 30 g 1   No current facility-administered medications on file prior to visit.     Allergies  Allergen Reactions  . Meloxicam Hives    Objective: Physical Exam  General: Well developed, nourished, no acute distress, awake, alert and oriented x 3. Patient assisted by daughter.   Vascular: Dorsalis pedis artery 1/4 bilateral, Posterior tibial artery 1/4 bilateral, skin temperature warm to warm proximal to distal bilateral lower extremities, + varicosities, trace edema bilateral, scant pedal hair present bilateral.  Neurological: Gross sensation present via light touch bilateral.   Dermatological: Skin is warm, dry, and supple bilateral, Nails are tender, long, thick, and discolored with mild subungal debris, no webspace macerations present bilateral, no open lesions present bilateral, + pre-ulcerative callus sub met 2 and 3 on right. No signs of infection bilateral.  Musculoskeletal: Severe bunion and hammertoe with dislocation R>L boney deformities noted bilateral. Muscular strength within normal limits without pain on range of motion. No pain with calf compression bilateral.  Assessment and Plan:  Problem List Items Addressed This Visit    None    Visit Diagnoses    Pre-ulcerative calluses    -  Primary   Dermatophytosis of nail       Pain of toe, unspecified laterality          -  Examined patient.  -Discussed treatment options for painful mycotic nails. -Mechanically debrided callus x 2 on right and reduced mycotic nails with sterile nail nipper and dremel nail file without incident. -Dispensed offloading pads to use on right foot -Recommend continue with post op shoe or offloading shoe with insert to prevent re-ulceration on right  -Patient to return in 6 weeks for follow up evaluation or sooner if symptoms  worsen.  Landis Martins, DPM

## 2017-01-26 DIAGNOSIS — H04123 Dry eye syndrome of bilateral lacrimal glands: Secondary | ICD-10-CM | POA: Diagnosis not present

## 2017-01-26 DIAGNOSIS — H01024 Squamous blepharitis left upper eyelid: Secondary | ICD-10-CM | POA: Diagnosis not present

## 2017-01-26 DIAGNOSIS — H01022 Squamous blepharitis right lower eyelid: Secondary | ICD-10-CM | POA: Diagnosis not present

## 2017-01-26 DIAGNOSIS — H01025 Squamous blepharitis left lower eyelid: Secondary | ICD-10-CM | POA: Diagnosis not present

## 2017-01-26 DIAGNOSIS — H01021 Squamous blepharitis right upper eyelid: Secondary | ICD-10-CM | POA: Diagnosis not present

## 2017-02-09 DIAGNOSIS — E538 Deficiency of other specified B group vitamins: Secondary | ICD-10-CM | POA: Diagnosis not present

## 2017-02-17 DIAGNOSIS — R3915 Urgency of urination: Secondary | ICD-10-CM | POA: Diagnosis not present

## 2017-02-17 DIAGNOSIS — N390 Urinary tract infection, site not specified: Secondary | ICD-10-CM | POA: Diagnosis not present

## 2017-02-17 DIAGNOSIS — L0292 Furuncle, unspecified: Secondary | ICD-10-CM | POA: Diagnosis not present

## 2017-02-21 ENCOUNTER — Encounter: Payer: Self-pay | Admitting: Podiatry

## 2017-02-21 ENCOUNTER — Ambulatory Visit (INDEPENDENT_AMBULATORY_CARE_PROVIDER_SITE_OTHER): Payer: Medicare Other | Admitting: Podiatry

## 2017-02-21 DIAGNOSIS — B351 Tinea unguium: Secondary | ICD-10-CM

## 2017-02-21 DIAGNOSIS — G609 Hereditary and idiopathic neuropathy, unspecified: Secondary | ICD-10-CM

## 2017-02-21 DIAGNOSIS — M79674 Pain in right toe(s): Secondary | ICD-10-CM | POA: Diagnosis not present

## 2017-02-21 DIAGNOSIS — L84 Corns and callosities: Secondary | ICD-10-CM

## 2017-02-21 DIAGNOSIS — M79675 Pain in left toe(s): Secondary | ICD-10-CM | POA: Diagnosis not present

## 2017-02-21 NOTE — Progress Notes (Signed)
Patient ID: [name], female   DOB: 04/18/1923, 83 y.o.   MRN: [id number]    Subjective: This patient presents for follow-up care for pre-ulcerative plantar callus on the right foot. Patient has a history of this area developing an ulceration with cellulitis and 2016 with ongoing local wound care. Also complaining of uncomfortable toenails and requesting nail debridement    Objective: Orientated 3 Pitting edema bilaterally DP and PT pulses 1/4 bilaterally Capillary reflex immediate bilaterally Sensation to 10 g monofilament wire intact 3/5 right 2/5 left Vibratory sensation nonreactive bilaterally Ankle reflex equal reactive bilaterally Large well-organized callus distal third left toe, pre-ulcerative HAV right Hammertoe second right with no drainage, warmth, edema in or around the second right toe Slow to propulsive gait requiring roller walker No open skin lesions bilaterally Absent hair growth bilaterally Atrophic skin bilaterally Reactive bleeding callus plantar right foot that remains closed after debridement Elongated,deformed,brittle, toenails 6-10  Assessment: Idiopathic peripheral neuropathy Symptomatic onychomycoses 6-10 Pre-ulcerative plantar callus right and distal third left  Plan: Debridement of toenails 6-10 mechanically and left without any bleeding Debrided plantar callus and distal third left lesion without any bleeding Wear surgical shoe on right foot with additional Plastizote insole  Reappoint 8 weeks

## 2017-02-21 NOTE — Patient Instructions (Signed)
Continue to wear the surgical shoe on the right foot

## 2017-03-02 DIAGNOSIS — H04123 Dry eye syndrome of bilateral lacrimal glands: Secondary | ICD-10-CM | POA: Diagnosis not present

## 2017-03-02 DIAGNOSIS — H401112 Primary open-angle glaucoma, right eye, moderate stage: Secondary | ICD-10-CM | POA: Diagnosis not present

## 2017-03-02 DIAGNOSIS — H40012 Open angle with borderline findings, low risk, left eye: Secondary | ICD-10-CM | POA: Diagnosis not present

## 2017-03-14 DIAGNOSIS — E538 Deficiency of other specified B group vitamins: Secondary | ICD-10-CM | POA: Diagnosis not present

## 2017-03-23 DIAGNOSIS — M1711 Unilateral primary osteoarthritis, right knee: Secondary | ICD-10-CM | POA: Diagnosis not present

## 2017-03-30 DIAGNOSIS — N183 Chronic kidney disease, stage 3 (moderate): Secondary | ICD-10-CM | POA: Diagnosis not present

## 2017-03-30 DIAGNOSIS — E538 Deficiency of other specified B group vitamins: Secondary | ICD-10-CM | POA: Diagnosis not present

## 2017-03-30 DIAGNOSIS — E785 Hyperlipidemia, unspecified: Secondary | ICD-10-CM | POA: Diagnosis not present

## 2017-03-30 DIAGNOSIS — E559 Vitamin D deficiency, unspecified: Secondary | ICD-10-CM | POA: Diagnosis not present

## 2017-03-30 DIAGNOSIS — Z0000 Encounter for general adult medical examination without abnormal findings: Secondary | ICD-10-CM | POA: Diagnosis not present

## 2017-03-30 DIAGNOSIS — N390 Urinary tract infection, site not specified: Secondary | ICD-10-CM | POA: Diagnosis not present

## 2017-03-30 DIAGNOSIS — E1122 Type 2 diabetes mellitus with diabetic chronic kidney disease: Secondary | ICD-10-CM | POA: Diagnosis not present

## 2017-03-30 DIAGNOSIS — I129 Hypertensive chronic kidney disease with stage 1 through stage 4 chronic kidney disease, or unspecified chronic kidney disease: Secondary | ICD-10-CM | POA: Diagnosis not present

## 2017-04-04 DIAGNOSIS — Z0000 Encounter for general adult medical examination without abnormal findings: Secondary | ICD-10-CM | POA: Diagnosis not present

## 2017-04-04 DIAGNOSIS — E1122 Type 2 diabetes mellitus with diabetic chronic kidney disease: Secondary | ICD-10-CM | POA: Diagnosis not present

## 2017-04-04 DIAGNOSIS — I270 Primary pulmonary hypertension: Secondary | ICD-10-CM | POA: Diagnosis not present

## 2017-04-04 DIAGNOSIS — I5032 Chronic diastolic (congestive) heart failure: Secondary | ICD-10-CM | POA: Diagnosis not present

## 2017-04-13 DIAGNOSIS — E538 Deficiency of other specified B group vitamins: Secondary | ICD-10-CM | POA: Diagnosis not present

## 2017-04-18 ENCOUNTER — Encounter: Payer: Self-pay | Admitting: Podiatry

## 2017-04-18 ENCOUNTER — Ambulatory Visit (INDEPENDENT_AMBULATORY_CARE_PROVIDER_SITE_OTHER): Payer: Medicare Other | Admitting: Podiatry

## 2017-04-18 VITALS — BP 139/67 | HR 59 | Temp 96.5°F

## 2017-04-18 DIAGNOSIS — L97511 Non-pressure chronic ulcer of other part of right foot limited to breakdown of skin: Secondary | ICD-10-CM | POA: Diagnosis not present

## 2017-04-18 NOTE — Progress Notes (Signed)
Patient ID: [name], female   DOB: 09/25/1923, 83 y.o.   MRN: [id number]   Subjective: Patient presents with daughter present to treatment room with approximately 2 weeks history of draining from the plantar skin lesion on the right foot. Patient has a history of recurrent ulceration in this area with ongoing local wound care since 2016. Patient relates completing oral antibiotics in the past week for a carbuncle. Patient's daughter present to treatment room  Objective: BP of 139 or 67 Pulse 59 Temperature 96.4   Pitting edema bilaterally DP and PT pulses 1/4 bilaterally Capillary reflex immediate bilaterally Sensation to 10 g monofilament wire intact 3/5 right 2/5 left Vibratory sensation nonreactive bilaterally Ankle reflex equal reactive bilaterally Large well-organized callus distal third left toe, pre-ulcerative HAV right Hammertoe second rightwithno drainage, warmth, edema in or around the second right toe Slow to propulsive gait requiring roller walker No open skin lesions bilaterally Absent hair growth bilaterally Atrophic skin bilaterally Reactive bleeding callus plantar right foot that after debridement as a 3 x 1 mm superficial ulcer with a granular base. There is no surrounding erythema or edema, warmth, malodor Elongated,deformed,brittle, toenails 6-10  Assessment: Superficial ulcer plantar aspect right foot without obvious clinical sign of infection Mycotic toenails 6-10 Idiopathic peripheral neuropathy  Plan: Debrided ulcer and apply triple antibiotic ointment dressing Patient started to apply triple antibiotic ointment dressing daily If drainage noted recommended diluted Betadine soaks Wear existing surgical shoe If notice any sudden increase in pain, swelling, redness, fever present to emergency department  Reappoint 7 days

## 2017-04-18 NOTE — Patient Instructions (Signed)
   Wear the surgical shoe on the right foot Soak daily and apply topical antibiotic ointment to the plantar ulcer right foot If you notice any sudden increase in pain, swelling, redness, fever present to the emergency room  Betadine Soak Instructions  Purchase an 8 oz. bottle of BETADINE solution (Povidone)  THE DAY AFTER THE PROCEDURE  Place 1 tablespoon of betadine solution in a quart of warm tap water.  Submerge your foot or feet with outer bandage intact for the initial soak; this will allow the bandage to become moist and wet for easy lift off.  Once you remove your bandage, continue to soak in the solution for 2-3 minutes.  This soak should be once a day.  Next, remove your foot or feet from solution, blot dry the affected area and cover.  You may use a band aid large enough to cover the area or use gauze and tape.  Apply other medications to the area as directed by the doctor such as o neosporin.  IF YOUR SKIN BECOMES IRRITATED WHILE USING THESE INSTRUCTIONS, IT IS OKAY TO SWITCH TO EPSOM SALTS AND WATER OR WHITE VINEGAR AND WATER.

## 2017-04-25 ENCOUNTER — Ambulatory Visit (INDEPENDENT_AMBULATORY_CARE_PROVIDER_SITE_OTHER): Payer: Medicare Other | Admitting: Podiatry

## 2017-04-25 ENCOUNTER — Encounter: Payer: Self-pay | Admitting: Podiatry

## 2017-04-25 VITALS — BP 140/73 | HR 69

## 2017-04-25 DIAGNOSIS — L84 Corns and callosities: Secondary | ICD-10-CM | POA: Diagnosis not present

## 2017-04-25 DIAGNOSIS — G609 Hereditary and idiopathic neuropathy, unspecified: Secondary | ICD-10-CM | POA: Diagnosis not present

## 2017-04-25 NOTE — Patient Instructions (Signed)
Discontinue Betadine soaks and topical antibiotic ointment Apply Vaseline and Band-Aid to the pre-ulcerative callus on the bottom of the right foot daily Continue to wear the surgical shoe in the right foot with Plastizote insole

## 2017-04-25 NOTE — Progress Notes (Signed)
Patient ID: Paula LeekMary C Noguez, female   DOB: 12/11/1922, 83 y.o.   MRN: [id number]   Subjective: Patient presents in the follow-up visit of 04/18/2017. At that time patient had a superficial plantar ulcer on the right foot without clinical sign of infection. Patient is a history of recurrent ulceration on the plantar aspect of the right foot with ongoing local wound care since 2016 Patient's daughter is present to treatment room  Objective: Pitting edema bilaterally DP and PT pulses 1/4 bilaterally Capillary reflex immediate bilaterally Sensation to 10 g monofilament wire intact 3/5 right 2/5 left Vibratory sensation nonreactive bilaterally Ankle reflex equal reactive bilaterally Large well-organized callus distal third left toe, pre-ulcerative HAV right Hammertoe second rightwithno drainage, warmth, edema in or around the second right toe Slow to propulsive gait requiring roller walker No open skin lesions bilaterally Absent hair growth bilaterally Atrophic skin bilaterally Reactive bleeding callus plantar right foot that after debridement  remains closed  Assessment: Pre-ulcerative plantar callus right Idiopathic neuropathy  Plan: Discontinue Betadine soaks and triple antibiotic ointment Debrided pre-ulcerative plantar callus Instructed patient patient's daughter apply Vaseline and a Band-Aid to pre-ulcerative callus daily Continue wearing surgical shoe with Plastizote insole  Reappoint 6 weeks

## 2017-05-18 ENCOUNTER — Ambulatory Visit: Payer: Medicare Other | Admitting: Nurse Practitioner

## 2017-05-29 DIAGNOSIS — J4521 Mild intermittent asthma with (acute) exacerbation: Secondary | ICD-10-CM | POA: Diagnosis not present

## 2017-05-29 DIAGNOSIS — I5032 Chronic diastolic (congestive) heart failure: Secondary | ICD-10-CM | POA: Diagnosis not present

## 2017-05-29 DIAGNOSIS — N183 Chronic kidney disease, stage 3 (moderate): Secondary | ICD-10-CM | POA: Diagnosis not present

## 2017-05-29 DIAGNOSIS — R05 Cough: Secondary | ICD-10-CM | POA: Diagnosis not present

## 2017-05-29 DIAGNOSIS — I2510 Atherosclerotic heart disease of native coronary artery without angina pectoris: Secondary | ICD-10-CM | POA: Diagnosis not present

## 2017-05-31 NOTE — Progress Notes (Signed)
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   She returns for followup of her last visit on 01/10/14 with me. She continues to do well from a neurovascular standpoint without any recurrent stroke or TIA symptoms since her initial stroke in December 2013. She is tolerating aspirin well without any side effects. She states her blood pressure is quite good and today it is 112/62 in our office. She has her annual physical scheduled in December at Dr. Benard Halsted office and plans to have labwork then. Carotid Ultrasound on 11/15/13 showed no significant stenosis. She has had no new neurovascular symptoms. Her main deficit from the stroke is weakness in the left leg. She a fall in May and suffered a compression fracture in her spine, a stress fracture in her leg and cracked ribs, which are healing. Her daughter states she tripped and fell off of the front porch. She is using a walker most of the time. Her main complaint is back pain, which she takes aleve for. UPDATE 07/15/15 CMMs.[name], 83 year old female returns for follow-up. 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 She is currently on aspirin 325 daily and has not had further stroke or TIA symptoms since December 2013. Her lipids are followed by Dr. Thea Silversmith. Her last carotid ultrasound on 11/15/2013 was negative for significant stenosis. She had one fall in May without injury. She is using a single-point cane to ambulate. She continues to live in her own home  and fairly independent with activities of daily living. She continues to drive without difficulty. She denies any memory issues. She returns for reevaluation  Update 11/13/2015 : PS She returns for follow-up after last visit accompanied today by her daughter. Patient was admitted in November 2016 with acute myocardial infarction and underwent cardiac stent and following the procedure and some slurred speech lasted 5 minutes. MRI scan of the brain showed tiny embolic infarcts related to the procedure. Echocardiogram showed normal ejection fraction without cardiac source of embolism. Lipid profile showed LDL 82 mg percent. Hemoglobin A1c was 5.9%. Patient recovered quickly. She is doing well without any residual neurological deficits. She remains on aspirin and Plavix and bruises easily but has had no bleeding problems. Her blood pressure is well controlled usually though it is slightly high today and 144/65. She is tolerating Lipitor well without myalgias or muscle aches. She is quite independent in her activities of daily living. She had carotid ultrasound done on 08/05/15 following the last visit which had shown no significant extracranial stenosis. UPDATE 05/12/16 CM. [name], 83 year old female returns for follow-up. She has a history of  right cortical infarct from small vessel disease in December 2013 . In addition she was admitted in November 2016 with acute myocardial infarction and underwent cardiac stent and following the procedure has some slurred speech which lasted about 5 minutes. MRI of the brain at that time showed tiny embolic infarcts related to the procedure patient recovered quickly and has not had residual neurologic deficits. She remains on aspirin and Plavix. Her blood pressure is well controlled in the office today at 125/72  she remains independent in her own home. She denies myalgias from her Lipitor. She returns for reevaluation UPDATE 08/23/2018CM Ms. 20, 83 year old female returns  for follow-up with history of right cortical infarct from small vessel disease in December 2013. She also had an admission in November 2016 with acute myocardial infarction and underwent cardiac stent, following procedure at some slurred speech which lasted about 5 minutes.MRI of the brain at that time showed tiny embolic infarcts related to the procedure patient recovered quickly and has not had residual neurologic deficit she is currently on aspirin for secondary stroke prevention without further stroke or TIA symptoms.She has minimal bruising , no bleeding. She was taken off of Plavix in December 2017 by cardiology. She remains on Lipitor for hyperlipidemia without complaints of myalgias she had one fall in January without injury. She ambulates with rolling walker. She is independent in all activities of daily living and continues to live alone. She is currently being treated for bronchitis. She returns for reevaluation REVIEW OF SYSTEMS: Full 14 system review of systems performed and notable only for those listed, all others are neg:  Constitutional: neg  Cardiovascular: neg Ear/Nose/Throat: neg  Skin: neg Eyes: neg Respiratory: Cough  Gastroitestinal: Urinary frequency Hematology/Lymphatic: Easy bruising Endocrine: neg Musculoskeletal: Joint pain Allergy/Immunology: neg Neurological: neg Psychiatric: neg Sleep : neg   ALLERGIES: Allergies  Allergen Reactions  . Meloxicam Hives    HOME MEDICATIONS: Outpatient Medications Prior to Visit  Medication Sig Dispense Refill  . aspirin 81 MG chewable tablet Chew 81 mg by mouth daily.    Marland Kitchen atorvastatin (LIPITOR) 80 MG tablet Take 1 tablet (80 mg total) by mouth daily at 6 PM. 60 tablet 0  . Calcium Carbonate-Vitamin D (CALTRATE 600+D PO) Take 1 tablet by mouth 2 (two) times daily.    . furosemide (LASIX) 20 MG tablet Take 20 mg by mouth daily as needed.     Marland Kitchen losartan (COZAAR) 50 MG tablet Take 1 tablet (50 mg total) by mouth daily. 60  tablet 0  . metoprolol (LOPRESSOR) 50 MG tablet Take 25 mg by mouth 2 (two) times daily.    . Multiple Vitamins-Minerals (MULTIPLE VITAMINS/WOMENS PO) Take 1 tablet by mouth daily. W/vitamin D    . Polyethyl Glycol-Propyl Glycol (SYSTANE OP) Apply 1 drop to eye 2 (two) times daily.    Marland Kitchen triamcinolone cream (KENALOG) 0.1 % Apply 1 application topically daily as needed. For rash 30 g 1  . latanoprost (XALATAN) 0.005 % ophthalmic solution PLACE 1 DROP INTO RIGHT EYE EVERY DAY  12  . clopidogrel (PLAVIX) 75 MG tablet Take 75 mg by mouth daily.     No facility-administered medications prior to visit.     PAST MEDICAL HISTORY: Past Medical History:  Diagnosis Date  . Anemia   . Arthritis   . Gout, joint   . Hypertension   . NSTEMI (non-ST elevated myocardial infarction) (HCC) 08/2015  . Stroke [facility])     PAST SURGICAL HISTORY: Past Surgical History:  Procedure Laterality Date  . ABDOMINAL HYSTERECTOMY    . BREAST SURGERY     CYST  . CARDIAC CATHETERIZATION N/A 08/18/2015   Procedure: Left Heart Cath and Coronary Angiography;  Surgeon: Lennette Bihari, MD;  Location: [facility] INVASIVE CV LAB;  Service: Cardiovascular;  Laterality: N/A;  . CARDIAC CATHETERIZATION  08/18/2015   Procedure: Coronary Stent Intervention;  Surgeon: Lennette Bihari, MD;  Location: [facility] INVASIVE CV LAB;  Service: Cardiovascular;;  . DILATION AND CURETTAGE OF UTERUS    .  EYE SURGERY  both  . HERNIA REPAIR    . JOINT REPLACEMENT  left knee  . REFRACTIVE SURGERY Right 2017    FAMILY HISTORY: Family History  Problem Relation Age of Onset  . Heart disease Mother   . Stroke Mother     SOCIAL HISTORY: Social History   Social History  . Marital status: Divorced    Spouse name: N/A  . Number of children: 3  . Years of education: 12   Occupational History  .      retired   Social History Main Topics  . Smoking status: Never Smoker  . Smokeless tobacco: Never Used  . Alcohol use No  . Drug use: No  . Sexual  activity: No   Other Topics Concern  . Not on file   Social History Narrative   Patient is single and lives at home alone.   Retired   Education 12th grade    Right handed   Caffeine  sometimes     PHYSICAL EXAM  Vitals:   06/01/17 0821  BP: 125/69  Pulse: 66  Weight: 153 lb 6.4 oz (69.6 kg)  Height: 5' (1.524 m)   Body mass index is 29.96 kg/m. General: well developed, well nourished, elderly Caucasian female, seated, in no evident distress Head: head normocephalic and atraumatic. Orohparynx benign  Neck: supple with no carotid or supraclavicular bruits  Cardiovascular: regular rate and rhythm,  Musculoskeletal: no deformity  Skin: Minimal bruising     Neurological examination  Mental Status: Awake and fully alert. Oriented to place and time. Recent and remote memory intact. Attention span, concentration and fund of knowledge appropriate. Mood and affect appropriate.  Cranial Nerves:  Pupils equal, briskly reactive to light. Extraocular movements full without nystagmus. Visual fields full to confrontation. Decreased hearing.. Facial sensation intact. Face, tongue, palate moves normally and symmetrically.  Motor: Normal bulk and tone. Normal strength in all tested extremity muscles.  Sensory: intact to touch  in the upper and lower extremities.  Coordination: Rapid alternating movements normal in all extremities. Finger-to-nose and heel-to-shin performed accurately bilaterally.  Gait and Station: Arises from chair without difficulty. Stance is wide based. Gait demonstrates normal stride length and balance. Not able to heel, toe and tandem walk without difficulty. Favors left leg. Ambulates with a rolling walker Reflexes: 1+ and symmetric.  DIAGNOSTIC DATA (LABS, IMAGING, TESTING) - I reviewed patient records, labs, notes, testing and imaging myself where available.      Component Value Date/Time   NA 138 06/23/2016 0845   K 5.0 06/23/2016 0845   CL 108  06/23/2016 0845   CO2 22 06/23/2016 0845   GLUCOSE 79 06/23/2016 0845   BUN 34 (H) 06/23/2016 0845   CREATININE 1.06 (H) 06/23/2016 0845   CALCIUM 9.2 06/23/2016 0845   PROT 6.4 06/23/2016 0845   ALBUMIN 4.1 06/23/2016 0845   AST 18 06/23/2016 0845   ALT 13 06/23/2016 0845   ALKPHOS 80 06/23/2016 0845   BILITOT 0.4 06/23/2016 0845   GFRNONAA 35 (L) 08/20/2015 0459   GFRAA 41 (L) 08/20/2015 0459   Lab Results  Component Value Date   CHOL 145 06/23/2016   HDL 66 06/23/2016   LDLCALC 50 06/23/2016   TRIG 147 06/23/2016   CHOLHDL 2.2 06/23/2016      ASSESSMENT AND PLAN 83 y.o. year old Caucasian lady with  right subcortical infarct from small vessel disease in December 2013 with vascular risk factors of hyperlipidemia, hypertension, age and sex. Recent small  card embolic infarct following cardiac stenting in November 2016 but no lasting neurological deficits. She is doing well from a neurovascular standpoint.  The patient is a current patient of Dr. Pearlean Brownie  who is out of the office today . This note is sent to the work in doctor.        Continue aspirin 81 mg daily   for secondary stroke prevention  Strict control of hypertension with blood pressure goal below 130/90, today's reading 125/69  hemoglobin A1c goal below 6.5%  Followed by PCP lipids with LDL cholesterol goal below 70 mg/dL. Labs followed by PCP continue Lipitor diet with plenty of whole grains, cereals, fruits and vegetables,  Chair exercises  Use walker at all times for safe ambulation Stay well hydrated Will discharge from stroke clinic I spent 25 minutes in total face to face time with the patient more than 50% of which was spent counseling and coordination of care, reviewing test results reviewing medications and discussing and reviewing the diagnosis of stroke and management of risk factors. Written information given Nilda Riggs, [facility], [facility], APRN  [facility] [address] [place], [place] [address] [phone]

## 2017-06-01 ENCOUNTER — Encounter: Payer: Self-pay | Admitting: Nurse Practitioner

## 2017-06-01 ENCOUNTER — Ambulatory Visit (INDEPENDENT_AMBULATORY_CARE_PROVIDER_SITE_OTHER): Payer: Medicare Other | Admitting: Nurse Practitioner

## 2017-06-01 VITALS — BP 125/69 | HR 66 | Ht 60.0 in | Wt 153.4 lb

## 2017-06-01 DIAGNOSIS — Z8673 Personal history of transient ischemic attack (TIA), and cerebral infarction without residual deficits: Secondary | ICD-10-CM | POA: Diagnosis not present

## 2017-06-01 DIAGNOSIS — E785 Hyperlipidemia, unspecified: Secondary | ICD-10-CM

## 2017-06-01 DIAGNOSIS — I10 Essential (primary) hypertension: Secondary | ICD-10-CM | POA: Diagnosis not present

## 2017-06-01 NOTE — Patient Instructions (Addendum)
Continue aspirin 81 mg daily   for secondary stroke prevention  Strict control of hypertension with blood pressure goal below 130/90, today's reading 125/69  hemoglobin A1c goal below 6.5%  Followed by PCP lipids with LDL cholesterol goal below 70 mg/dL. Labs followed by PCP continue Lipitor diet with plenty of whole grains, cereals, fruits and vegetables,  Chair exercises  Use walker at all times for safe ambulation Stay well hydrated Will discharge from stroke clinic Stroke Prevention Some medical conditions and behaviors are associated with an increased chance of having a stroke. You may prevent a stroke by making healthy choices and managing medical conditions. How can I reduce my risk of having a stroke?  Stay physically active. Get at least 30 minutes of activity on most or all days.  Do not smoke. It may also be helpful to avoid exposure to secondhand smoke.  Limit alcohol use. Moderate alcohol use is considered to be: ? No more than 2 drinks per day for men. ? No more than 1 drink per day for nonpregnant women.  Eat healthy foods. This involves: ? Eating 5 or more servings of fruits and vegetables a day. ? Making dietary changes that address high blood pressure (hypertension), high cholesterol, diabetes, or obesity.  Manage your cholesterol levels. ? Making food choices that are high in fiber and low in saturated fat, trans fat, and cholesterol may control cholesterol levels. ? Take any prescribed medicines to control cholesterol as directed by your health care provider.  Manage your diabetes. ? Controlling your carbohydrate and sugar intake is recommended to manage diabetes. ? Take any prescribed medicines to control diabetes as directed by your health care provider.  Control your hypertension. ? Making food choices that are low in salt (sodium), saturated fat, trans fat, and cholesterol is recommended to manage hypertension. ? Ask your health care provider if you need  treatment to lower your blood pressure. Take any prescribed medicines to control hypertension as directed by your health care provider. ? If you are 18-45 years of age, have your blood pressure checked every 3-5 years. If you are 72 years of age or older, have your blood pressure checked every year.  Maintain a healthy weight. ? Reducing calorie intake and making food choices that are low in sodium, saturated fat, trans fat, and cholesterol are recommended to manage weight.  Stop drug abuse.  Avoid taking birth control pills. ? Talk to your health care provider about the risks of taking birth control pills if you are over 56 years old, smoke, get migraines, or have ever had a blood clot.  Get evaluated for sleep disorders (sleep apnea). ? Talk to your health care provider about getting a sleep evaluation if you snore a lot or have excessive sleepiness.  Take medicines only as directed by your health care provider. ? For some people, aspirin or blood thinners (anticoagulants) are helpful in reducing the risk of forming abnormal blood clots that can lead to stroke. If you have the irregular heart rhythm of atrial fibrillation, you should be on a blood thinner unless there is a good reason you cannot take them. ? Understand all your medicine instructions.  Make sure that other conditions (such as anemia or atherosclerosis) are addressed. Get help right away if:  You have sudden weakness or numbness of the face, arm, or leg, especially on one side of the body.  Your face or eyelid droops to one side.  You have sudden confusion.  You have trouble  speaking (aphasia) or understanding.  You have sudden trouble seeing in one or both eyes.  You have sudden trouble walking.  You have dizziness.  You have a loss of balance or coordination.  You have a sudden, severe headache with no known cause.  You have new chest pain or an irregular heartbeat. Any of these symptoms may represent a  serious problem that is an emergency. Do not wait to see if the symptoms will go away. Get medical help at once. Call your local emergency services (911 in [place] Do not drive yourself to the hospital. This information is not intended to replace advice given to you by your health care provider. Make sure you discuss any questions you have with your health care provider. Document Released: 11/03/2004 Document Revised: 03/03/2016 Document Reviewed: 03/29/2013 Elsevier Interactive Patient Education  2017 [employer].

## 2017-06-01 NOTE — Progress Notes (Signed)
I have read the note, and I agree with the clinical assessment and plan.  [name],[name]   

## 2017-06-02 DIAGNOSIS — J4521 Mild intermittent asthma with (acute) exacerbation: Secondary | ICD-10-CM | POA: Diagnosis not present

## 2017-06-06 ENCOUNTER — Encounter: Payer: Self-pay | Admitting: Podiatry

## 2017-06-06 ENCOUNTER — Ambulatory Visit (INDEPENDENT_AMBULATORY_CARE_PROVIDER_SITE_OTHER): Payer: Medicare Other | Admitting: Podiatry

## 2017-06-06 VITALS — BP 144/73 | HR 71 | Temp 96.5°F

## 2017-06-06 DIAGNOSIS — G609 Hereditary and idiopathic neuropathy, unspecified: Secondary | ICD-10-CM

## 2017-06-06 DIAGNOSIS — L84 Corns and callosities: Secondary | ICD-10-CM

## 2017-06-06 NOTE — Progress Notes (Signed)
Patient ID: [name], female   DOB: 29-Apr-1923, 83 y.o.   MRN: [id number]    Subjective: Patient presents in the follow-up visit of 04/18/2017. At that time patient had a superficial plantar ulcer on the right foot without clinical sign of infection. Patient is a history of recurrent ulceration on the plantar aspect of the right foot with ongoing local wound care since 2016 Patient's daughter is present to treatment room  Objective: Pitting edema bilaterally DP and PT pulses 1/4 bilaterally Capillary reflex immediate bilaterally Sensation to 10 g monofilament wire intact 3/5 right 2/5 left Vibratory sensation nonreactive bilaterally Ankle reflex equal reactive bilaterally Large well-organized callus distal third left toe, pre-ulcerative HAV right Hammertoe second rightwithno drainage, warmth, edema in or around the second right toe Slow to propulsive gait requiring roller walker No open skin lesions bilaterally Absent hair growth bilaterally Atrophic skin bilaterally Reactive bleeding callus plantar right foot that after debridement  remains closed  Assessment: Pre-ulcerative plantar callus right Idiopathic neuropathy  Plan: Discontinue Betadine soaks and triple antibiotic ointment Debrided pre-ulcerative plantar callus Instructed patient patient's daughter apply Vaseline and a Band-Aid to pre-ulcerative callus daily Continue wearing surgical shoe with Plastizote insole  Reappoint 6 weeks for debridement of pre-ulcerative or ulcerative lesion as well as mycotic toenails

## 2017-06-06 NOTE — Patient Instructions (Signed)
Wear the surgical shoe with Plastizote insole on the right foot Apply Vaseline and Band-Aid to the callused area on the ball of the right foot daily

## 2017-06-22 DIAGNOSIS — E538 Deficiency of other specified B group vitamins: Secondary | ICD-10-CM | POA: Diagnosis not present

## 2017-06-29 DIAGNOSIS — H40012 Open angle with borderline findings, low risk, left eye: Secondary | ICD-10-CM | POA: Diagnosis not present

## 2017-06-29 DIAGNOSIS — H04123 Dry eye syndrome of bilateral lacrimal glands: Secondary | ICD-10-CM | POA: Diagnosis not present

## 2017-06-29 DIAGNOSIS — Z961 Presence of intraocular lens: Secondary | ICD-10-CM | POA: Diagnosis not present

## 2017-06-29 DIAGNOSIS — H401112 Primary open-angle glaucoma, right eye, moderate stage: Secondary | ICD-10-CM | POA: Diagnosis not present

## 2017-07-03 DIAGNOSIS — J4521 Mild intermittent asthma with (acute) exacerbation: Secondary | ICD-10-CM | POA: Diagnosis not present

## 2017-07-06 DIAGNOSIS — M1711 Unilateral primary osteoarthritis, right knee: Secondary | ICD-10-CM | POA: Diagnosis not present

## 2017-07-06 DIAGNOSIS — M79662 Pain in left lower leg: Secondary | ICD-10-CM | POA: Diagnosis not present

## 2017-07-11 ENCOUNTER — Institutional Professional Consult (permissible substitution): Payer: Medicare Other | Admitting: Internal Medicine

## 2017-07-13 ENCOUNTER — Institutional Professional Consult (permissible substitution): Payer: Medicare Other | Admitting: Internal Medicine

## 2017-07-17 ENCOUNTER — Ambulatory Visit (INDEPENDENT_AMBULATORY_CARE_PROVIDER_SITE_OTHER): Payer: Medicare Other | Admitting: Podiatry

## 2017-07-17 DIAGNOSIS — L989 Disorder of the skin and subcutaneous tissue, unspecified: Secondary | ICD-10-CM

## 2017-07-17 DIAGNOSIS — B351 Tinea unguium: Secondary | ICD-10-CM

## 2017-07-17 DIAGNOSIS — L84 Corns and callosities: Secondary | ICD-10-CM | POA: Diagnosis not present

## 2017-07-17 DIAGNOSIS — M21611 Bunion of right foot: Secondary | ICD-10-CM

## 2017-07-17 DIAGNOSIS — M79676 Pain in unspecified toe(s): Secondary | ICD-10-CM | POA: Diagnosis not present

## 2017-07-17 DIAGNOSIS — M2041 Other hammer toe(s) (acquired), right foot: Secondary | ICD-10-CM | POA: Diagnosis not present

## 2017-07-18 ENCOUNTER — Encounter: Payer: Self-pay | Admitting: Podiatry

## 2017-07-18 NOTE — Progress Notes (Signed)
    Subjective: Patient is a 83 y.o. female presenting to the office today as a new patient with a chief complaint of a painful callus lesion to the right foot that has been present for several months. She describes the pain as soreness. There are no modifying factors noted. She is requesting the lesion be checked. Patient also complains of elongated, thickened nails that cause pain while ambulating in shoes. Patient is unable to trim their own nails. Patient presents today for further treatment and evaluation.  Past Medical History:  Diagnosis Date  . Anemia   . Arthritis   . Gout, joint   . Hypertension   . NSTEMI (non-ST elevated myocardial infarction) (HCC) 08/2015  . Stroke [facility])     Objective:  Physical Exam General: Alert and oriented x3 in no acute distress  Dermatology: Hyperkeratotic lesion present on the right foot. Pain on palpation with a central nucleated core noted. Skin is warm, dry and supple bilateral lower extremities. Negative for open lesions or macerations. Nails are tender, long, thickened and dystrophic with subungual debris, consistent with onychomycosis, 1-5 bilateral. No signs of infection noted.  Vascular: Palpable pedal pulses bilaterally. No edema or erythema noted. Capillary refill within normal limits.  Neurological: Epicritic and protective threshold grossly intact bilaterally.   Musculoskeletal Exam: Pain on palpation at the keratotic lesion noted. Clinical evidence of bunion deformity with hammertoes second right digit. Range of motion within normal limits bilateral. Muscle strength 5/5 in all groups bilateral.  Assessment: 1. Onychodystrophic nails 1-5 bilateral with hyperkeratosis of nails.  2. Onychomycosis of nail due to dermatophyte bilateral 3. Pre-ulcerative callus to the right foot 4. HAV w/ bunion deformity right lower extremity 5. Hammertoe deformity right second toe   Plan of Care:  #1 Patient evaluated. #2 Mechanical debridement of  nails 1-5 bilaterally performed using a nail nipper. Filed with dremel without incident.  #3 appointment with [name] for custom molded orthotics with second MPJ offload. #4 Patient is to return to the clinic in 6 weeks.   Felecia Shelling, DPM [facility]  Dr. Felecia Shelling, DPM    [address]                                        [place], [place] [address]                Office [phone]  Fax [phone]

## 2017-07-24 ENCOUNTER — Encounter: Payer: Self-pay | Admitting: Internal Medicine

## 2017-07-24 ENCOUNTER — Ambulatory Visit (INDEPENDENT_AMBULATORY_CARE_PROVIDER_SITE_OTHER)
Admission: RE | Admit: 2017-07-24 | Discharge: 2017-07-24 | Disposition: A | Discharge status: Home / Self Care | Payer: Medicare Other | Source: Ambulatory Visit | Attending: Internal Medicine | Admitting: Internal Medicine

## 2017-07-24 ENCOUNTER — Other Ambulatory Visit (INDEPENDENT_AMBULATORY_CARE_PROVIDER_SITE_OTHER): Payer: Medicare Other

## 2017-07-24 ENCOUNTER — Ambulatory Visit (INDEPENDENT_AMBULATORY_CARE_PROVIDER_SITE_OTHER): Payer: Medicare Other | Admitting: Internal Medicine

## 2017-07-24 VITALS — BP 132/64 | HR 71 | Ht 60.0 in | Wt 149.0 lb

## 2017-07-24 DIAGNOSIS — R05 Cough: Secondary | ICD-10-CM

## 2017-07-24 DIAGNOSIS — R058 Other specified cough: Secondary | ICD-10-CM

## 2017-07-24 LAB — CBC WITH DIFFERENTIAL/PLATELET
Basophils Absolute: 0 10*3/uL (ref 0.0–0.1)
Basophils Relative: 0.3 % (ref 0.0–3.0)
EOS ABS: 0.1 10*3/uL (ref 0.0–0.7)
Eosinophils Relative: 1.7 % (ref 0.0–5.0)
HCT: 37.2 % (ref 36.0–46.0)
HEMOGLOBIN: 11.7 g/dL — AB (ref 12.0–15.0)
LYMPHS PCT: 16.1 % (ref 12.0–46.0)
Lymphs Abs: 1.4 10*3/uL (ref 0.7–4.0)
MCHC: 31.5 g/dL (ref 30.0–36.0)
MCV: 90.7 fl (ref 78.0–100.0)
MONO ABS: 0.5 10*3/uL (ref 0.1–1.0)
Monocytes Relative: 5.7 % (ref 3.0–12.0)
Neutro Abs: 6.6 10*3/uL (ref 1.4–7.7)
Neutrophils Relative %: 76.2 % (ref 43.0–77.0)
Platelets: 235 10*3/uL (ref 150.0–400.0)
RBC: 4.1 Mil/uL (ref 3.87–5.11)
RDW: 15.9 % — ABNORMAL HIGH (ref 11.5–15.5)
WBC: 8.6 10*3/uL (ref 4.0–10.5)

## 2017-07-24 MED ORDER — METHYLPREDNISOLONE ACETATE 80 MG/ML IJ SUSP
120.0000 mg | Freq: Once | INTRAMUSCULAR | Status: AC
  Administered 2017-07-24: 120 mg via INTRAMUSCULAR

## 2017-07-24 MED ORDER — PANTOPRAZOLE SODIUM 40 MG PO TBEC: 40.0000 mg | DELAYED_RELEASE_TABLET | Freq: Every day | ORAL | 2 refills | Status: DC

## 2017-07-24 NOTE — Progress Notes (Signed)
Subjective:     Patient ID: [name], female   DOB: Sep 23, 1923,    MRN: [id number]  HPI  12 yowf never smoker with onset 50's watery rhinitis year round on coricidan prn assoc with pnds / cough rx robiussin and rx by Dr Renne Crigler bad enough for rx in Winter better p abx/ cough med but typically lasting sev months assoc with hoarseness but never needed inhalers and occ prednisone but referred by new pcp (in absence of McKenzie)  referred to pulmonary clinic 07/24/2017 by Dr   Renne Crigler   07/24/2017 1st [facility] office visit/ Wert   Chief Complaint  Patient presents with  . Pulmonary Consult    Referred by Dr. Merri Brunette.  Pt states she has "allergies every year that causes bronchitis" for as long as she can remember.  She occ produces some clear sputum.  She also c/o runny nose.   onset of typical spell one month prior to OV  cough is worse at hs but extinguishes p cough drop s noct or am flare typically  Just produces a small amt of clear mucus esp at hs  Nebulizer x 3 weeks ? Helped - no sure  Not on gerd rx  Worse with smells esp diesel/perfumes    No obvious other trigger or pattern to day to day or daytime variability or assoc   purulent sputum or mucus plugs or hemoptysis or cp or chest tightness, subjective wheeze or overt sinus or hb symptoms. No unusual exp hx or h/o childhood pna/ asthma or knowledge of premature birth.  Sleeping most nights  flat without nocturnal  or early am exacerbation  of respiratory  c/o's or need for noct saba. Also denies any obvious fluctuation of symptoms with weather or environmental changes or other aggravating or alleviating factors except as outlined above   Current Allergies, Complete Past Medical History, Past Surgical History, Family History, and Social History were reviewed in [employer] record.  ROS  The following are not active complaints unless bolded Hoarseness, sore throat, dysphagia, dental problems, itching,  sneezing,  nasal congestion or discharge of excess mucus or purulent secretions, ear ache,   fever, chills, sweats, unintended wt loss or wt gain, classically pleuritic or exertional cp,  orthopnea pnd or leg swelling, presyncope, palpitations, abdominal pain, anorexia, nausea, vomiting, diarrhea  or change in bowel habits or change in bladder habits, change in stools or change in urine, dysuria, hematuria,  rash, arthralgias, visual complaints, headache, numbness, weakness or ataxia or problems with walking or coordination,  change in mood/affect or memory.        Current Meds  Medication Sig  . Acetaminophen (TYLENOL) 325 MG CAPS Take 325 mg by mouth 3 (three) times daily as needed.  Marland Kitchen aspirin 81 MG chewable tablet Chew 81 mg by mouth daily.  Marland Kitchen atorvastatin (LIPITOR) 80 MG tablet Take 1 tablet (80 mg total) by mouth daily at 6 PM.  . bimatoprost (LUMIGAN) 0.01 % SOLN Place 1 drop into the right eye at bedtime.  . brimonidine-timolol (COMBIGAN) 0.2-0.5 % ophthalmic solution Place 1 drop into both eyes every 12 (twelve) hours.  . Calcium Carbonate-Vitamin D (CALTRATE 600+D PO) Take 1 tablet by mouth 2 (two) times daily.  Marland Kitchen losartan (COZAAR) 50 MG tablet Take 1 tablet (50 mg total) by mouth daily.  . metoprolol (LOPRESSOR) 50 MG tablet Take 25 mg by mouth 2 (two) times daily.  . Multiple Vitamins-Minerals (MULTIPLE VITAMINS/WOMENS PO) Take 1 tablet by  mouth daily. W/vitamin D  . Polyethyl Glycol-Propyl Glycol (SYSTANE OP) Apply 1 drop to eye 2 (two) times daily.  Marland Kitchen triamcinolone cream (KENALOG) 0.1 % Apply 1 application topically daily as needed. For rash                Review of Systems     Objective:   Physical Exam       amb wf nad walks with 3 wheeler not able to get out of chair s help and unable to climb on exam table    Wt Readings from Last 3 Encounters:  07/24/17 149 lb (67.6 kg)  06/01/17 153 lb 6.4 oz (69.6 kg)  06/23/16 153 lb 6.4 oz (69.6 kg)    Vital signs  reviewed - Note on arrival 02 sats  99% on RA     HEENT: nl dentition, turbinates bilaterally, and oropharynx. Nl external ear canals without cough reflex   NECK :  without JVD/Nodes/TM/ nl carotid upstrokes bilaterally   LUNGS: no acc muscle use,  Nl contour chest which is clear to A and P bilaterally without cough on insp or exp maneuvers   CV:  RRR  no s3 or murmur or increase in P2, and no edema   ABD:  soft and nontender with nl inspiratory excursion in the supine position. No bruits or organomegaly appreciated, bowel sounds nl  MS:  Nl gait/ ext warm without deformities, calf tenderness, cyanosis or clubbing No obvious joint restrictions   SKIN: warm and dry without lesions    NEURO:  alert, approp, nl sensorium with  no motor or cerebellar deficits apparent.     CXR PA and Lateral:   07/24/2017 :    I personally reviewed images and agree with radiology impression as follows:    COPD.  Stable right hilar prominence, favor vascular. Right perihilar atelectasis or scarring. Stable mild cardiomegaly. My impression : typical of hyperinflation of aging rather than copd and R prominent hilum is vascular by prev ct 03/2015    Labs ordered 07/24/2017   Allergy profile     Assessment:

## 2017-07-24 NOTE — Patient Instructions (Signed)
Pantoprazole (protonix) 40 mg   Take  30-60 min before first meal of the day and Pepcid (famotidine)  20 mg one @  bedtime until return to office - this is the best way to tell whether stomach acid is contributing to your problem.     For drainage / throat tickle try take CHLORPHENIRAMINE  4 mg - take one every 4 hours as needed - available over the counter- may cause drowsiness so start with just a bedtime dose(one hour before bed)  or two and see how you tolerate it before trying in daytime     GERD (REFLUX)  is an extremely common cause of respiratory symptoms just like yours , many times with no obvious heartburn at all.    It can be treated with medication, but also with lifestyle changes including elevation of the head of your bed (ideally with 6 inch  bed blocks),  Smoking cessation, avoidance of late meals, excessive alcohol, and avoid fatty foods, chocolate, peppermint, colas, red wine, and acidic juices such as orange juice.  NO MINT OR MENTHOL PRODUCTS SO NO COUGH DROPS   USE SUGARLESS CANDY INSTEAD (Jolley ranchers or Stover's or Life Savers) or even ice chips will also do - the key is to swallow to prevent all throat clearing. NO OIL BASED VITAMINS - use powdered substitutes.   Depomedrol 120 mg IM    Please remember to go to the lab and x-ray department downstairs in the basement  for your tests - we will call you with the results when they are available.   Please schedule a follow up office visit in 4 weeks, sooner if needed

## 2017-07-24 NOTE — Progress Notes (Signed)
Spoke with pt and notified of results per Dr. Wert. Pt verbalized understanding and denied any questions. 

## 2017-07-25 LAB — RESPIRATORY ALLERGY PROFILE REGION II ~~LOC~~
Allergen, A. alternata, m6: <0.1 kU/L
Allergen, Cedar tree, t12: <0.1 kU/L
Allergen, Comm Silver Birch, t9: <0.1 kU/L
Allergen, Cottonwood, t14: <0.1 kU/L
Allergen, Mouse Urine Protein, e78: <0.1 kU/L
Allergen, Mulberry, t76: <0.1 kU/L
Aspergillus fumigatus, m3: <0.1 kU/L
Bermuda Grass: <0.1 kU/L
CLASS: 0
CLASS: 0
CLASS: 0
CLASS: 0
CLASS: 0
CLASS: 0
CLASS: 0
CLASS: 0
CLASS: 0
CLASS: 0
CLASS: 0
CLASS: 0
COMMON RAGWEED (SHORT) (W1) IGE: <0.1 kU/L
Cat Dander: <0.1 kU/L
Class: 0
Class: 0
Class: 0
Class: 0
Class: 0
Class: 0
Class: 0
Class: 0
Class: 0
Class: 0
Class: 0
Class: 0
Dog Dander: <0.1 kU/L
Elm IgE: <0.1 kU/L
IGE (IMMUNOGLOBULIN E), SERUM: 18 kU/L (ref <=114)
Johnson Grass: <0.1 kU/L
Pecan/Hickory Tree IgE: <0.1 kU/L
Rough Pigweed  IgE: <0.1 kU/L
Sheep Sorrel IgE: <0.1 kU/L
TIMOTHY GRASS: 0.12 kU/L — AB

## 2017-07-25 LAB — INTERPRETATION:

## 2017-07-25 NOTE — Assessment & Plan Note (Signed)
Allergy profile 07/24/2017 >  Eos 0.1 /  IgE  Pending  - max rx for GERD/ 1st gen H1 07/24/2017   The most common causes of chronic cough in immunocompetent adults include the following: upper airway cough syndrome (UACS), previously referred to as postnasal drip syndrome (PNDS), which is caused by variety of rhinosinus conditions; (2) asthma; (3) GERD; (4) chronic bronchitis from cigarette smoking or other inhaled environmental irritants; (5) nonasthmatic eosinophilic bronchitis; and (6) bronchiectasis.   These conditions, singly or in combination, have accounted for up to 94% of the causes of chronic cough in prospective studies.   Other conditions have constituted no >6% of the causes in prospective studies These have included bronchogenic carcinoma, chronic interstitial pneumonia, sarcoidosis, left ventricular failure, ACEI-induced cough, and aspiration from a condition associated with pharyngeal dysfunction.    Chronic cough is often simultaneously caused by more than one condition. A single cause has been found from 38 to 82% of the time, multiple causes from 18 to 62%. Multiply caused cough has been the result of three diseases up to 42% of the time.       Most likely this is Upper airway cough syndrome (previously labeled PNDS),  is so named because it's frequently impossible to sort out how much is  CR/sinusitis with freq throat clearing (which can be related to primary GERD)   vs  causing  secondary (" extra esophageal")  GERD from wide swings in gastric pressure that occur with throat clearing, often  promoting self use of mint and menthol lozenges that reduce the lower esophageal sphincter tone and exacerbate the problem further in a cyclical fashion.   These are the same pts (now being labeled as having "irritable larynx syndrome" by some cough centers) who not infrequently have a history of having failed to tolerate ace inhibitors,  dry powder inhalers or biphosphonates or report having  atypical/extraesophageal reflux symptoms that don't respond to standard doses of PPI  and are easily confused as having aecopd or asthma flares by even experienced allergists/ pulmonologists (myself included).    Of the three most common causes of  Sub-acute or recurrent or chronic cough, only one (GERD)  can actually contribute to/ trigger  the other two (asthma and post nasal drip syndrome)  and perpetuate the cylce of cough.  While not intuitively obvious, many patients with chronic low grade reflux do not cough until there is a primary insult that disturbs the protective epithelial barrier and exposes sensitive nerve endings.   This is typically viral but can be direct physical injury such as with an endotracheal tube.   The point is that once this occurs, it is difficult to eliminate the cycle  using anything but a maximally effective acid suppression regimen at least in the short run, accompanied by an appropriate diet to address non acid GERD and eliminate pnds with 1st gen H1 blockers per guidelines  Then regroup with all meds in hand using a trust but verify approach to confirm accurate Medication  Reconciliation The principal here is that until we are certain that the  patients are doing what we've asked, it makes no sense to ask them to do more.     Total time devoted to counseling  > 50 % of initial 60 min office visit:  review case with pt/daughter discussion of options/alternatives/ personally creating written customized instructions  in presence of pt  then going over those specific  Instructions directly with the pt including how to use all  of the meds but in particular covering each new medication in detail and the difference between the maintenance= "automatic" meds and the prns using an action plan format for the latter (If this problem/symptom => do that organization reading Left to right).  Please see AVS from this visit for a full list of these instructions which I personally wrote for  this pt and  are unique to this visit.

## 2017-07-26 NOTE — Progress Notes (Signed)
Called and left a detailed msg with results

## 2017-07-27 DIAGNOSIS — E538 Deficiency of other specified B group vitamins: Secondary | ICD-10-CM | POA: Diagnosis not present

## 2017-07-31 ENCOUNTER — Ambulatory Visit: Payer: Medicare Other | Admitting: Orthotics

## 2017-07-31 ENCOUNTER — Ambulatory Visit: Payer: Medicare Other | Admitting: Cardiology

## 2017-07-31 DIAGNOSIS — M79676 Pain in unspecified toe(s): Principal | ICD-10-CM

## 2017-07-31 DIAGNOSIS — M79673 Pain in unspecified foot: Secondary | ICD-10-CM

## 2017-07-31 DIAGNOSIS — B351 Tinea unguium: Secondary | ICD-10-CM

## 2017-07-31 DIAGNOSIS — L989 Disorder of the skin and subcutaneous tissue, unspecified: Secondary | ICD-10-CM

## 2017-07-31 DIAGNOSIS — M2041 Other hammer toe(s) (acquired), right foot: Secondary | ICD-10-CM

## 2017-07-31 DIAGNOSIS — G609 Hereditary and idiopathic neuropathy, unspecified: Secondary | ICD-10-CM

## 2017-07-31 DIAGNOSIS — L97511 Non-pressure chronic ulcer of other part of right foot limited to breakdown of skin: Secondary | ICD-10-CM

## 2017-07-31 DIAGNOSIS — M21611 Bunion of right foot: Secondary | ICD-10-CM

## 2017-07-31 NOTE — Progress Notes (Signed)
Patient came in today for casting f/o.  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  Plan is to offload area of concern w/ F/o that has area divot. Plus pcell cover.  Plan on Richy to fab a supersoft f/o to address conditon.  Patient advised 300.00 charge and signed ABN.

## 2017-08-02 DIAGNOSIS — J4521 Mild intermittent asthma with (acute) exacerbation: Secondary | ICD-10-CM | POA: Diagnosis not present

## 2017-08-03 NOTE — Progress Notes (Signed)
HPI: Follow-up coronary artery disease. Carotid Dopplers October 2016 showed no significant stenosis. Admitted November 2016 and ruled in for a non-ST elevation myocardial infarction. Cardiac catheterization revealed a 95% distal RCA. Patient had a drug-eluting stent. Procedure complicated by CVA. Echocardiogram November 2016 showed normal LV function, grade 1 diastolic dysfunction, mild mitral regurgitation, moderate tricuspid regurgitation and moderate to severe pulmonary hypertension. Since last seen, patient denies dyspnea, chest pain, palpitations or syncope.  Current Outpatient Prescriptions  Medication Sig Dispense Refill  . Acetaminophen (TYLENOL) 325 MG CAPS Take 325 mg by mouth 3 (three) times daily as needed.    Marland Kitchen. aspirin 81 MG chewable tablet Chew 81 mg by mouth daily.    Marland Kitchen. atorvastatin (LIPITOR) 80 MG tablet Take 1 tablet (80 mg total) by mouth daily at 6 PM. 60 tablet 0  . bimatoprost (LUMIGAN) 0.01 % SOLN Place 1 drop into the right eye at bedtime.    . brimonidine-timolol (COMBIGAN) 0.2-0.5 % ophthalmic solution Place 1 drop into both eyes every 12 (twelve) hours.    . Calcium Carbonate-Vitamin D (CALTRATE 600+D PO) Take 1 tablet by mouth 2 (two) times daily.    Marland Kitchen. losartan (COZAAR) 50 MG tablet Take 1 tablet (50 mg total) by mouth daily. 60 tablet 0  . metoprolol (LOPRESSOR) 50 MG tablet Take 25 mg by mouth 2 (two) times daily.    . Multiple Vitamins-Minerals (MULTIPLE VITAMINS/WOMENS PO) Take 1 tablet by mouth daily. W/vitamin D    . pantoprazole (PROTONIX) 40 MG tablet Take 1 tablet (40 mg total) by mouth daily. Take 30-60 min before first meal of the day 30 tablet 2  . Polyethyl Glycol-Propyl Glycol (SYSTANE OP) Apply 1 drop to eye 2 (two) times daily.    Marland Kitchen. triamcinolone cream (KENALOG) 0.1 % Apply 1 application topically daily as needed. For rash 30 g 1   No current facility-administered medications for this visit.      Past Medical History:  Diagnosis Date  .  Anemia   . Arthritis   . Gout, joint   . Hypertension   . NSTEMI (non-ST elevated myocardial infarction) (HCC) 08/2015  . Stroke [facility](HCC)     Past Surgical History:  Procedure Laterality Date  . ABDOMINAL HYSTERECTOMY    . BREAST SURGERY     CYST  . CARDIAC CATHETERIZATION N/A 08/18/2015   Procedure: Left Heart Cath and Coronary Angiography;  Surgeon: Lennette Biharihomas A Kelly, MD;  [facility];  Service: Cardiovascular;  Laterality: N/A;  . CARDIAC CATHETERIZATION  08/18/2015   Procedure: Coronary Stent Intervention;  Surgeon: Lennette Biharihomas A Kelly, MD;  Location: [facility] INVASIVE CV LAB;  Service: Cardiovascular;;  . DILATION AND CURETTAGE OF UTERUS    . EYE SURGERY  both  . HERNIA REPAIR    . JOINT REPLACEMENT  left knee  . REFRACTIVE SURGERY Right 2017    Social History   Social History  . Marital status: Divorced    Spouse name: N/A  . Number of children: 3  . Years of education: 12   Occupational History  .      retired   Social History Main Topics  . Smoking status: Never Smoker  . Smokeless tobacco: Never Used  . Alcohol use No  . Drug use: No  . Sexual activity: No   Other Topics Concern  . Not on file   Social History Narrative   Patient is single and lives at home alone.   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 Heart disease Mother   . Stroke Mother     ROS: no fevers or chills, productive cough, hemoptysis, dysphasia, odynophagia, melena, hematochezia, dysuria, hematuria, rash, seizure activity, orthopnea, PND, pedal edema, claudication. Remaining systems are negative.  Physical Exam: Well-developed well-nourished in no acute distress.  Skin is warm and dry.  HEENT is normal.  Neck is supple.  Chest is clear to auscultation with normal expansion.  Cardiovascular exam is regular rate and rhythm. 2/6 systolic murmur left sternal border. Abdominal exam nontender or  distended. No masses palpated. Extremities show no edema. neuro grossly intact  ECG- Normal sinus rhythm at a rate of 72. No ST changes. personally reviewed  A/P  1 Coronary artery disease-patient doing well with no chest pain. Plan to continue medical therapy with aspirin and statin.  2 hypertension-blood pressure is elevated but she has not taken AM meds. Continue present medications and follow. Check potassium and renal function.   3 hyperlipidemia-continue statin. Check lipids and liver.   4 probable mild aortic stenosis on examination-given age would like to be conservative. We will consider an echocardiogram at next office visit in one year.   Olga Millers, MD

## 2017-08-11 ENCOUNTER — Encounter: Payer: Self-pay | Admitting: Cardiology

## 2017-08-11 ENCOUNTER — Ambulatory Visit (INDEPENDENT_AMBULATORY_CARE_PROVIDER_SITE_OTHER): Payer: Medicare Other | Admitting: Cardiology

## 2017-08-11 VITALS — BP 150/60 | HR 72 | Ht 60.0 in | Wt 149.0 lb

## 2017-08-11 DIAGNOSIS — I10 Essential (primary) hypertension: Secondary | ICD-10-CM | POA: Diagnosis not present

## 2017-08-11 DIAGNOSIS — E7800 Pure hypercholesterolemia, unspecified: Secondary | ICD-10-CM

## 2017-08-11 DIAGNOSIS — I2510 Atherosclerotic heart disease of native coronary artery without angina pectoris: Secondary | ICD-10-CM | POA: Diagnosis not present

## 2017-08-11 LAB — BASIC METABOLIC PANEL
BUN/Creatinine Ratio: 36 — ABNORMAL HIGH (ref 12–28)
BUN: 44 mg/dL — AB (ref 10–36)
CALCIUM: 9.4 mg/dL (ref 8.7–10.3)
CO2: 22 mmol/L (ref 20–29)
CREATININE: 1.21 mg/dL — AB (ref 0.57–1.00)
Chloride: 108 mmol/L — ABNORMAL HIGH (ref 96–106)
GFR calc Af Amer: 45 mL/min/{1.73_m2} — ABNORMAL LOW (ref >59)
GFR, EST NON AFRICAN AMERICAN: 39 mL/min/{1.73_m2} — AB (ref >59)
Glucose: 93 mg/dL (ref 65–99)
POTASSIUM: 5 mmol/L (ref 3.5–5.2)
Sodium: 143 mmol/L (ref 134–144)

## 2017-08-11 LAB — HEPATIC FUNCTION PANEL
ALBUMIN: 4 g/dL (ref 3.2–4.6)
ALT: 16 IU/L (ref 0–32)
AST: 24 IU/L (ref 0–40)
Alkaline Phosphatase: 99 IU/L (ref 39–117)
Bilirubin Total: 0.3 mg/dL (ref 0.0–1.2)
Bilirubin, Direct: 0.12 mg/dL (ref 0.00–0.40)
TOTAL PROTEIN: 6.5 g/dL (ref 6.0–8.5)

## 2017-08-11 LAB — LIPID PANEL
CHOL/HDL RATIO: 2.1 ratio (ref 0.0–4.4)
Cholesterol, Total: 147 mg/dL (ref 100–199)
HDL: 71 mg/dL (ref >39)
LDL CALC: 50 mg/dL (ref 0–99)
Triglycerides: 128 mg/dL (ref 0–149)
VLDL CHOLESTEROL CAL: 26 mg/dL (ref 5–40)

## 2017-08-11 NOTE — Patient Instructions (Signed)
Medication Instructions:   NO CHANGE  Labwork:  Your physician recommends that you HAVE LAB WORK TODAY  Follow-Up:  Your physician wants you to follow-up in: ONE YEAR WITH DR CRENSHAW You will receive a reminder letter in the mail two months in advance. If you don't receive a letter, please call our office to schedule the follow-up appointment.   If you need a refill on your cardiac medications before your next appointment, please call your pharmacy.    

## 2017-08-15 ENCOUNTER — Encounter: Payer: Self-pay | Admitting: *Deleted

## 2017-08-29 ENCOUNTER — Encounter: Payer: Self-pay | Admitting: Podiatry

## 2017-08-29 ENCOUNTER — Ambulatory Visit: Payer: Medicare Other | Admitting: Podiatry

## 2017-08-29 ENCOUNTER — Ambulatory Visit: Payer: Medicare Other | Admitting: Orthotics

## 2017-08-29 DIAGNOSIS — L84 Corns and callosities: Secondary | ICD-10-CM

## 2017-08-29 DIAGNOSIS — G609 Hereditary and idiopathic neuropathy, unspecified: Secondary | ICD-10-CM

## 2017-08-29 DIAGNOSIS — M79676 Pain in unspecified toe(s): Secondary | ICD-10-CM

## 2017-08-29 DIAGNOSIS — M2041 Other hammer toe(s) (acquired), right foot: Secondary | ICD-10-CM

## 2017-08-29 DIAGNOSIS — L97511 Non-pressure chronic ulcer of other part of right foot limited to breakdown of skin: Secondary | ICD-10-CM | POA: Diagnosis not present

## 2017-08-29 DIAGNOSIS — B351 Tinea unguium: Secondary | ICD-10-CM

## 2017-08-29 NOTE — Patient Instructions (Signed)
Apply Silvadene cream to the callused area on the bottom of the right foot daily to drainage ends Apply foam pad on second right toe and attach with Coflex tape do not overtighten Continue wearing surgical shoe with Plastizote insole

## 2017-08-29 NOTE — Progress Notes (Signed)
Patient ID: Paula LeekMary C [name], female   DOB: 02/07/1923, 83 y.o.   MRN: [id number]    Subjective: Patient presents in the follow-up visit of 04/18/2017. At that time patient had a superficial plantar ulcer on the right foot without clinical sign of infection. Patient is a history of recurrent ulceration on the plantar aspect of the right foot with ongoing local wound care since 2016 Patient's daughter is present to treatment room. Patient had seen Dr. Logan BoresEvans on the visit of 07/17/2017 any prescribed custom foot orthotics with a recommendation wearing in the standard shoe  Objective: Pitting edema bilaterally DP and PT pulses 1/4 bilaterally Capillary reflex immediate bilaterally Sensation to 10 g monofilament wire intact 3/5 right 2/5 left Vibratory sensation nonreactive bilaterally Ankle reflex equal reactive bilaterally Large well-organized callus distal third left toe, pre-ulcerative HAV right Hammertoe second rightwithno drainage, warmth, edema in or around the second right toe Slow to propulsive gait requiring roller walker No open skin lesions bilaterally Absent hair growth bilaterally Atrophic skin bilaterally Reactive bleeding callus plantar right foot that after debridement breaks down into very superficial ulcer Reactive cord dorsal second right toe without open wound  Assessment: Superficial noninfected skin ulcer plantar second MPJ right Severe hammertoe resulting in friction rub and surgical shoe resulting in reactive skin formation  Plan: Debride plantar skin wound and apply Silvadene dressing Continue wearing surgical shoe with Plastizote insole Apply foam pad on second right toe  Patient consulted with ped orthotist, today and will obtain plaster impressions for custom molded shoes for the indication of a reoccurring plantar skin ulcer second MPJ right, severe hammertoe second MPJ right  Reappoint 6 weeks or sooner if patient has a concern

## 2017-08-29 NOTE — Progress Notes (Signed)
Patient picked up custom foot orthotics, but was advised against wearing them UNTIL CUSTOM Therapeutic shoes are ordered to accomodate foot deformity Right)  In agreement with office manager, these custom shoes will be at no cost.  Patient will come back to get fitted.

## 2017-09-02 DIAGNOSIS — J4521 Mild intermittent asthma with (acute) exacerbation: Secondary | ICD-10-CM | POA: Diagnosis not present

## 2017-09-04 ENCOUNTER — Encounter: Payer: Self-pay | Admitting: Internal Medicine

## 2017-09-04 ENCOUNTER — Ambulatory Visit: Payer: Medicare Other | Admitting: Internal Medicine

## 2017-09-04 VITALS — BP 106/62 | HR 66 | Ht 60.0 in | Wt 151.6 lb

## 2017-09-04 DIAGNOSIS — I10 Essential (primary) hypertension: Secondary | ICD-10-CM

## 2017-09-04 DIAGNOSIS — R05 Cough: Secondary | ICD-10-CM

## 2017-09-04 DIAGNOSIS — R058 Other specified cough: Secondary | ICD-10-CM

## 2017-09-04 LAB — NITRIC OXIDE: Nitric Oxide: 14

## 2017-09-04 NOTE — Assessment & Plan Note (Addendum)
Allergy profile 07/24/2017 >  Eos 0.1 /  IgE  18 / min pos RAST for timothy grass  - max rx for GERD/ 1st gen H1 07/24/2017 > no better, poor tol of H1 reported 09/04/2017 so rec change to zyrtec - FENO 09/04/2017  =   14 - Spirometry 09/04/2017   Nl with min curvature   No evidence at all to support asthma here but clear evidence of Upper airway cough syndrome (previously labeled PNDS),  is so named because it's frequently impossible to sort out how much is  CR/sinusitis with freq throat clearing (which can be related to primary GERD)   vs  causing  secondary (" extra esophageal")  GERD from wide swings in gastric pressure that occur with throat clearing, often  promoting self use of mint and menthol lozenges that reduce the lower esophageal sphincter tone and exacerbate the problem further in a cyclical fashion.   These are the same pts (now being labeled as having "irritable larynx syndrome" by some cough centers) who not infrequently have a history of having failed to tolerate ace inhibitors,  dry powder inhalers or biphosphonates or report having atypical/extraesophageal reflux symptoms that don't respond to standard doses of PPI  and are easily confused as having aecopd or asthma flares by even experienced allergists/ pulmonologists (myself included).   Probably related to non - specific rather than allergic rhinitis with pnds.   Intranasal steroids and intranasal antihistamines are effective for symptoms associated with non-allergic rhinitis, whereas second generation antihistamines such as cetirizine, loratadine and fexofenadine have been found to be ineffective for this condition.  Therefor rec she first try pushing the 1st gen H1 blockers per guidelines  And and if not effective then can try the zyrtec but the next step if not effective would be trial of very low dose gabapentin if tolerated   Before I would consider that wyuld try off losartan in favor or alterate arb and/or consider In the  setting of respiratory symptoms of unknown etiology,  It would be preferable to d/c both losartan and lopressor and just use bystolic, the most beta -1  selective Beta blocker available in sample form, with bisoprolol the most selective generic choice  on the market, at least on a trial basis, to make sure the spillover Beta 2 effects of the less specific Beta blockers are not contributing to this patient's symptoms.     I had an extended discussion with the patient/ daughter reviewing all relevant studies completed to date and  lasting 15 to 20 minutes of a 25 minute visit re:  Discussed in detail all the  indications, usual  risks and alternatives  relative to the benefits with patient/ daugher  who agree  to proceed with rx as outlined.     Each maintenance medication was reviewed in detail including most importantly the difference between maintenance and prns and under what circumstances the prns are to be triggered using an action plan format that is not reflected in the computer generated alphabetically organized AVS.    Please see AVS for specific instructions unique to this visit that I personally wrote and verbalized to the the pt in detail and then reviewed with pt  by my nurse highlighting any  changes in therapy recommended at today's visit to their plan of care.

## 2017-09-04 NOTE — Patient Instructions (Signed)
Try either taking chlortrimeton or Zyrtec (over the counter) one hour before bed with pepcid 20 mg at same time to see what effect if any it has on your night time cough.   GERD (REFLUX)  is an extremely common cause of respiratory symptoms just like yours , many times with no obvious heartburn at all.    It can be treated with medication, but also with lifestyle changes including elevation of the head of your bed (ideally with 6 inch  bed blocks),  Smoking cessation, avoidance of late meals, excessive alcohol, and avoid fatty foods, chocolate, peppermint, colas, red wine, and acidic juices such as orange juice.  NO MINT OR MENTHOL PRODUCTS SO NO COUGH DROPS   USE SUGARLESS CANDY INSTEAD (Jolley ranchers or Stover's or Life Savers) or even ice chips will also do - the key is to swallow to prevent all throat clearing. NO OIL BASED VITAMINS - use powdered substitutes.   Please schedule a follow up office visit in 6 weeks, call sooner if needed

## 2017-09-04 NOTE — Progress Notes (Signed)
Subjective:     Patient ID: [name], female   DOB: 12/18/1922,    MRN: [id number]    Brief patient profile:  [age] yowf never smoker with onset 50's watery rhinitis year round on coricidan prn assoc with pnds / cough rx robiussin and rx by Dr Renne CriglerPharr bad enough for rx in Winter better p abx/ cough med but typically lasting sev months assoc with hoarseness but never needed inhalers and occ prednisone but referred by new pcp (in absence of McKenzie)  referred to pulmonary clinic 07/24/2017 by Dr   Renne CriglerPharr.   History of Present Illness  07/24/2017 1st [facility] office visit/ Wert   Chief Complaint  Patient presents with  . Pulmonary Consult    Referred by Dr. Merri BrunetteWalter Pharr.  Pt states she has "allergies every year that causes bronchitis" for as long as she can remember.  She occ produces some clear sputum.  She also c/o runny nose.   onset of typical spell one month prior to OV  cough is worse at hs but extinguishes p cough drop s noct or am flare typically  Just produces a small amt of clear mucus esp at hs  Nebulizer x 3 weeks ? Helped - no sure  Not on gerd rx  Worse with smells esp diesel/perfumes  rec Pantoprazole (protonix) 40 mg   Take  30-60 min before first meal of the day and Pepcid (famotidine)  20 mg one @  bedtime until return to office - this is the best way to tell whether stomach acid is contributing to your problem.   For drainage / throat tickle try take CHLORPHENIRAMINE  4 mg - take one every 4 hours as needed - available over the counter- may cause drowsiness so start with just a bedtime dose(one hour before bed)  or two and see how you tolerate it before trying in daytime   GERD diet  Depomedrol 120 mg IM  Please remember to go to the lab and x-ray department downstairs in the basement  for your tests - we will call you with the results when they are available. Please schedule a follow up office visit in 4 weeks, sooner if needed      09/04/2017  f/u ov/Wert re:   Cough x 40 years with sense of pnds  Chief Complaint  Patient presents with  . Follow-up    Cough is unchanged. No new co's.   no longer uses any cough drops  Not limited by breathing from desired activities  But very frail  Cough is worse in church and at hs / not related to meals at all but has to chew really carefully ever since cva or chokes Hates nasal spray - doesn't seem to help the pnds anyway ? Has taken zyrtec    No obvious day to day or daytime variability or assoc excess/ purulent sputum or mucus plugs or hemoptysis or cp or chest tightness, subjective wheeze or overt   hb symptoms. No unusual exp hx or h/o childhood pna/ asthma or knowledge of premature birth.  Sleeping ok flat without nocturnal  or early am exacerbation  of respiratory  c/o's or need for noct saba. Also denies any obvious fluctuation of symptoms with weather or environmental changes or other aggravating or alleviating factors except as outlined above   Current Allergies, Complete Past Medical History, Past Surgical History, Family History, and Social History were reviewed in [facility] electronic medical record.  ROS  The following are  not active complaints unless bolded Hoarseness, sore throat, dysphagia, dental problems, itching, sneezing,  nasal congestion or discharge of excess mucus or purulent secretions, ear ache,   fever, chills, sweats, unintended wt loss or wt gain, classically pleuritic or exertional cp,  orthopnea pnd or leg swelling, presyncope, palpitations, abdominal pain, anorexia, nausea, vomiting, diarrhea  or change in bowel habits or change in bladder habits, change in stools or change in urine, dysuria, hematuria,  rash, arthralgias, visual complaints, headache, numbness, weakness or ataxia or problems with walking or coordination,  change in mood/affect or memory.        Current Meds  Medication Sig  . Acetaminophen (TYLENOL) 325 MG CAPS Take 325 mg by mouth 3 (three) times daily as  needed.  Marland Kitchen. aspirin 81 MG chewable tablet Chew 81 mg by mouth daily.  Marland Kitchen. atorvastatin (LIPITOR) 80 MG tablet Take 1 tablet (80 mg total) by mouth daily at 6 PM.  . bimatoprost (LUMIGAN) 0.01 % SOLN Place 1 drop into the right eye at bedtime.  . brimonidine-timolol (COMBIGAN) 0.2-0.5 % ophthalmic solution Place 1 drop into both eyes every 12 (twelve) hours.  . Calcium Carbonate-Vitamin D (CALTRATE 600+D PO) Take 1 tablet by mouth 2 (two) times daily.  . chlorpheniramine (CHLOR-TRIMETON) 4 MG tablet Take 4 mg by mouth every 4 (four) hours as needed for allergies.  . famotidine (PEPCID) 20 MG tablet Take 20 mg by mouth daily.  Marland Kitchen. losartan (COZAAR) 50 MG tablet Take 1 tablet (50 mg total) by mouth daily.  . metoprolol (LOPRESSOR) 50 MG tablet Take 25 mg by mouth 2 (two) times daily.  . Multiple Vitamins-Minerals (MULTIPLE VITAMINS/WOMENS PO) Take 1 tablet by mouth daily. W/vitamin D  . pantoprazole (PROTONIX) 40 MG tablet Take 1 tablet (40 mg total) by mouth daily. Take 30-60 min before first meal of the day  . Polyethyl Glycol-Propyl Glycol (SYSTANE OP) Apply 1 drop to eye 2 (two) times daily.  Marland Kitchen. triamcinolone cream (KENALOG) 0.1 % Apply 1 application topically daily as needed. For rash           Objective:   Physical Exam    Amb wf quite frail, can't stand s assistance, uses rollator but got controls wrapped up with purse and needed assistance to untangle so examined in chair instead of on table     09/04/2017     152   07/24/17 149 lb (67.6 kg)  06/01/17 153 lb 6.4 oz (69.6 kg)  06/23/16 153 lb 6.4 oz (69.6 kg)    Vital signs reviewed - Note on arrival 02 sats  98% on RA      HEENT: nl dentition, turbinates bilaterally, and oropharynx. Nl external ear canals without cough reflex/ chewing mint gum   NECK :  without JVD/Nodes/TM/ nl carotid upstrokes bilaterally   LUNGS: no acc muscle use,  Nl contour chest which is clear to A and P bilaterally without cough on insp or exp  maneuvers   CV:  RRR  no s3 or murmur or increase in P2, and no edema   ABD:  soft and nontender with nl inspiratory excursion in the supine position. No bruits or organomegaly appreciated, bowel sounds nl  MS:  Nl gait/ ext warm without deformities, calf tenderness, cyanosis or clubbing No obvious joint restrictions   SKIN: warm and dry without lesions    NEURO:  alert, approp, nl sensorium with  no motor or cerebellar deficits apparent.           Assessment:

## 2017-09-05 ENCOUNTER — Encounter: Payer: Self-pay | Admitting: Internal Medicine

## 2017-09-05 NOTE — Assessment & Plan Note (Signed)
See uacs concerns, for now though I did not change her hbp rx

## 2017-09-07 DIAGNOSIS — E538 Deficiency of other specified B group vitamins: Secondary | ICD-10-CM | POA: Diagnosis not present

## 2017-09-25 ENCOUNTER — Ambulatory Visit (INDEPENDENT_AMBULATORY_CARE_PROVIDER_SITE_OTHER): Payer: Medicare Other | Admitting: Podiatry

## 2017-09-25 DIAGNOSIS — G609 Hereditary and idiopathic neuropathy, unspecified: Secondary | ICD-10-CM

## 2017-09-25 DIAGNOSIS — L84 Corns and callosities: Secondary | ICD-10-CM

## 2017-09-25 NOTE — Progress Notes (Signed)
Patient cast today for custom therapeutic shoes to address the following foot conditions: chronic plantar ulceration R, hammertoes b/L, and HAV b/l. Patient chose [name] shoes [employer].

## 2017-09-28 DIAGNOSIS — M109 Gout, unspecified: Secondary | ICD-10-CM | POA: Diagnosis not present

## 2017-09-28 DIAGNOSIS — E785 Hyperlipidemia, unspecified: Secondary | ICD-10-CM | POA: Diagnosis not present

## 2017-09-28 DIAGNOSIS — N390 Urinary tract infection, site not specified: Secondary | ICD-10-CM | POA: Diagnosis not present

## 2017-09-28 DIAGNOSIS — E1122 Type 2 diabetes mellitus with diabetic chronic kidney disease: Secondary | ICD-10-CM | POA: Diagnosis not present

## 2017-09-28 DIAGNOSIS — E538 Deficiency of other specified B group vitamins: Secondary | ICD-10-CM | POA: Diagnosis not present

## 2017-09-28 DIAGNOSIS — D509 Iron deficiency anemia, unspecified: Secondary | ICD-10-CM | POA: Diagnosis not present

## 2017-09-29 DIAGNOSIS — M1711 Unilateral primary osteoarthritis, right knee: Secondary | ICD-10-CM | POA: Diagnosis not present

## 2017-10-02 DIAGNOSIS — J4521 Mild intermittent asthma with (acute) exacerbation: Secondary | ICD-10-CM | POA: Diagnosis not present

## 2017-10-05 DIAGNOSIS — E538 Deficiency of other specified B group vitamins: Secondary | ICD-10-CM | POA: Diagnosis not present

## 2017-10-05 DIAGNOSIS — N183 Chronic kidney disease, stage 3 (moderate): Secondary | ICD-10-CM | POA: Diagnosis not present

## 2017-10-05 DIAGNOSIS — E1122 Type 2 diabetes mellitus with diabetic chronic kidney disease: Secondary | ICD-10-CM | POA: Diagnosis not present

## 2017-10-05 DIAGNOSIS — I129 Hypertensive chronic kidney disease with stage 1 through stage 4 chronic kidney disease, or unspecified chronic kidney disease: Secondary | ICD-10-CM | POA: Diagnosis not present

## 2017-10-05 DIAGNOSIS — E785 Hyperlipidemia, unspecified: Secondary | ICD-10-CM | POA: Diagnosis not present

## 2017-10-05 DIAGNOSIS — I5032 Chronic diastolic (congestive) heart failure: Secondary | ICD-10-CM | POA: Diagnosis not present

## 2017-10-16 ENCOUNTER — Ambulatory Visit: Payer: Medicare Other | Admitting: Podiatry

## 2017-10-19 ENCOUNTER — Encounter: Payer: Self-pay | Admitting: Podiatry

## 2017-10-19 ENCOUNTER — Ambulatory Visit: Payer: Medicare Other | Admitting: Internal Medicine

## 2017-10-19 ENCOUNTER — Encounter: Payer: Self-pay | Admitting: Internal Medicine

## 2017-10-19 VITALS — BP 102/60 | HR 66 | Ht 60.0 in | Wt 148.0 lb

## 2017-10-19 DIAGNOSIS — R058 Other specified cough: Secondary | ICD-10-CM

## 2017-10-19 DIAGNOSIS — R05 Cough: Secondary | ICD-10-CM | POA: Diagnosis not present

## 2017-10-19 NOTE — Patient Instructions (Addendum)
For drainage / throat tickle try take CHLORPHENIRAMINE  4 mg - take one every 4 hours as needed - available over the counter- may cause drowsiness so start with just a bedtime dose or two and see how you tolerate it before trying in daytime    GERD (REFLUX)  is an extremely common cause of respiratory symptoms just like yours , many times with no obvious heartburn at all.    It can be treated with medication, but also with lifestyle changes including elevation of the head of your bed (ideally with 6 inch  bed blocks),  Smoking cessation, avoidance of late meals, excessive alcohol, and avoid fatty foods, chocolate, peppermint, colas, red wine, and acidic juices such as orange juice.  NO MINT OR MENTHOL PRODUCTS SO NO COUGH DROPS   USE SUGARLESS CANDY INSTEAD (Jolley ranchers or Stover's or Life Savers) or even ice chips will also do - the key is to swallow to prevent all throat clearing. NO OIL BASED VITAMINS - use powdered substitutes.   If not happy with chlorphenriamine then ok to change to zyrtec 10 mg at bedtime     If you are satisfied with your treatment plan,  let your doctor know and he/she can either refill your medications or you can return here when your prescription runs out.     If in any way you are not 100% satisfied,  please tell us.  If 100% better, tell your friends!  Pulmonary follow up is as needed

## 2017-10-19 NOTE — Progress Notes (Addendum)
Subjective:     Patient ID: [name], female   DOB: 04/20/1923,    MRN: [id number]    Brief patient profile:  [age] yowf never smoker with onset 50's watery rhinitis year round on coricidan prn assoc with pnds / cough rx robiussin and rx by Paula Renne [name] bad enough for rx in Winter better p abx/ cough med but typically lasting sev months assoc with hoarseness but never needed inhalers and occ prednisone but referred by new pcp (in absence of McKenzie)  referred to pulmonary clinic 07/24/2017 by Paula   Renne [name]   History of Present Illness  07/24/2017 1st [facility] office visit/ [name]   Chief Complaint  Patient presents with  . Pulmonary Consult    Referred by Paula. Merri BrunetteWalter [name]  Pt states she has "allergies every year that causes bronchitis" for as long as she can remember.  She occ produces some clear sputum.  She also c/o runny nose.   onset of typical spell one month prior to OV  cough is worse at hs but extinguishes p cough drop s noct or am flare typically  Just produces a small amt of clear mucus esp at hs  Nebulizer x 3 weeks ? Helped - no sure  Not on gerd rx  Worse with smells esp diesel/perfumes  rec Pantoprazole (protonix) 40 mg   Take  30-60 min before first meal of the day and Pepcid (famotidine)  20 mg one @  bedtime until return to office - this is the best way to tell whether stomach acid is contributing to your problem.   For drainage / throat tickle try take CHLORPHENIRAMINE  4 mg - take one every 4 hours as needed - available over the counter- may cause drowsiness so start with just a bedtime dose(one hour before bed)  or two and see how you tolerate it before trying in daytime   GERD diet  Depomedrol 120 mg IM  Please remember to go to the lab and x-ray department downstairs in the basement  for your tests - we will call you with the results when they are available. Please schedule a follow up office visit in 4 weeks, sooner if needed      09/04/2017  f/u ov/[name] re:   Cough x 40 years with sense of pnds  Chief Complaint  Patient presents with  . Follow-up    Cough is unchanged. No new co's.   no longer uses any cough drops  Not limited by breathing from desired activities  But very frail  Cough is worse in church and at hs / not related to meals at all but has to chew really carefully ever since cva or chokes Hates nasal spray - doesn't seem to help the pnds anyway ? Has taken zyrtec  rec Try either taking chlortrimeton or Zyrtec (over the counter) one hour before bed with pepcid 20 mg at same time to see what effect if any it has on your night time cough.  GERD rx      10/19/2017  f/u ov/[name] re:  Cough x 40 years  Chief Complaint  Patient presents with  . Follow-up    Cough is much improved. She has developed a rash on her right leg she relates to taking macrobib recently. Rash is painful.   denies cough at hs  / some daytime cough but much less freq/ less severe - only using maybe one or two chlorpheniratmine per day. Not limited by breathing from desired  activities but very sedentary     No obvious day to day or daytime variability or assoc excess/ purulent sputum or mucus plugs or hemoptysis or cp or chest tightness, subjective wheeze or overt sinus or hb symptoms. No unusual exposure hx or h/o childhood pna/ asthma or knowledge of premature birth.  Sleeping ok flat without nocturnal  or early am exacerbation  of respiratory  c/o's or need for noct saba. Also denies any obvious fluctuation of symptoms with weather or environmental changes or other aggravating or alleviating factors except as outlined above   Current Allergies, Complete Past Medical History, Past Surgical History, Family History, and Social History were reviewed in [facility] record.  ROS  The following are not active complaints unless bolded Hoarseness, sore throat, dysphagia, dental problems, itching, sneezing,  nasal congestion or discharge of excess  mucus or purulent secretions, ear ache,   fever, chills, sweats, unintended wt loss or wt gain, classically pleuritic or exertional cp,  orthopnea pnd or leg swelling, presyncope, palpitations, abdominal pain, anorexia, nausea, vomiting, diarrhea  or change in bowel habits or change in bladder habits, change in stools or change in urine, dysuria, hematuria,  rash, arthralgias, visual complaints, headache, numbness, weakness or ataxia or problems with walking or coordination,  change in mood/affect or memory.        Current Meds  Medication Sig  . Acetaminophen (TYLENOL) 325 MG CAPS Take 325 mg by mouth 3 (three) times daily as needed.  Marland Kitchen aspirin 81 MG chewable tablet Chew 81 mg by mouth daily.  Marland Kitchen atorvastatin (LIPITOR) 80 MG tablet Take 1 tablet (80 mg total) by mouth daily at 6 PM.  . bimatoprost (LUMIGAN) 0.01 % SOLN Place 1 drop into the right eye at bedtime.  . brimonidine-timolol (COMBIGAN) 0.2-0.5 % ophthalmic solution Place 1 drop into both eyes every 12 (twelve) hours.  . Calcium Carbonate-Vitamin D (CALTRATE 600+D PO) Take 1 tablet by mouth 2 (two) times daily.  . chlorpheniramine (CHLOR-TRIMETON) 4 MG tablet Take 4 mg by mouth every 4 (four) hours as needed for allergies.  . famotidine (PEPCID) 20 MG tablet Take 20 mg by mouth daily.  Marland Kitchen losartan (COZAAR) 50 MG tablet Take 1 tablet (50 mg total) by mouth daily.  . metoprolol (LOPRESSOR) 50 MG tablet Take 25 mg by mouth 2 (two) times daily.  . Multiple Vitamins-Minerals (MULTIPLE VITAMINS/WOMENS PO) Take 1 tablet by mouth daily. W/vitamin D  . pantoprazole (PROTONIX) 40 MG tablet Take 1 tablet (40 mg total) by mouth daily. Take 30-60 min before first meal of the day  . Polyethyl Glycol-Propyl Glycol (SYSTANE OP) Apply 1 drop to eye 2 (two) times daily.  Marland Kitchen triamcinolone cream (KENALOG) 0.1 % Apply 1 application topically daily as needed. For rash      .            Objective:   Physical Exam    Amb wf quite frail, walks stooped  over rollator     10/19/2017        148   09/04/2017     152   07/24/17 149 lb (67.6 kg)  06/01/17 153 lb 6.4 oz (69.6 kg)  06/23/16 153 lb 6.4 oz (69.6 kg)    Vital signs reviewed - Note on arrival 02 sats  98% on RA        HEENT: nl dentition, turbinates bilaterally, and oropharynx. Nl external ear canals without cough reflex   NECK :  without JVD/Nodes/TM/ nl carotid upstrokes  bilaterally   LUNGS: no acc muscle use,  Nl contour chest which is clear to A and P bilaterally without cough on insp or exp maneuvers   CV:  RRR  no s3 or murmur or increase in P2, and no edema   ABD:  soft and nontender with nl inspiratory excursion in the supine position. No bruits or organomegaly appreciated, bowel sounds nl  MS:  Nl gait/ ext warm without deformities, calf tenderness, cyanosis or clubbing No obvious joint restrictions   SKIN: warm and dry with macular morbilliform rash both legs R>> L   NEURO:  alert, approp, nl sensorium with  no motor or cerebellar deficits apparent.                Assessment:

## 2017-10-19 NOTE — Assessment & Plan Note (Addendum)
Allergy profile 07/24/2017 >  Eos 0.1 /  IgE  18 / min pos RAST for timothy grass  - max rx for GERD/ 1st gen H1 07/24/2017   - FENO 09/04/2017  =   14 - Spirometry 09/04/2017   Nl with min curvature  Cough esp at hs better on present rx including max rx for gerd and 1st gen H1 blockers per guidelines   for pnds with no evidence of a pulmonary problem so can certainly use more h1 if needed and if not able to tolerate due to cns effects ok to change to zyrtec >>  f/u here is prn   Each maintenance medication was reviewed in detail including most importantly the difference between maintenance and as needed and under what circumstances the prns are to be used.  Please see AVS for specific  Instructions which are unique to this visit and I personally typed out  which were reviewed in detail in writing with the patient and a copy provided.

## 2017-10-20 ENCOUNTER — Encounter: Payer: Self-pay | Admitting: Podiatry

## 2017-10-20 ENCOUNTER — Ambulatory Visit: Payer: Medicare Other | Admitting: Podiatry

## 2017-10-20 DIAGNOSIS — G609 Hereditary and idiopathic neuropathy, unspecified: Secondary | ICD-10-CM | POA: Diagnosis not present

## 2017-10-20 DIAGNOSIS — M79674 Pain in right toe(s): Secondary | ICD-10-CM | POA: Diagnosis not present

## 2017-10-20 DIAGNOSIS — M79675 Pain in left toe(s): Secondary | ICD-10-CM | POA: Diagnosis not present

## 2017-10-20 DIAGNOSIS — L97511 Non-pressure chronic ulcer of other part of right foot limited to breakdown of skin: Secondary | ICD-10-CM

## 2017-10-20 DIAGNOSIS — B351 Tinea unguium: Secondary | ICD-10-CM | POA: Diagnosis not present

## 2017-10-20 NOTE — Progress Notes (Signed)
Subjective:   Patient ID: Casimer LeekMary C Anastasia, female   DOB: 84 y.o.   MRN: [id number]   HPI Patient presents stating that there still concerned about the breakdown of tissue underneath the right first metatarsal and states that she seems to help but is still present   ROS      Objective:  Physical Exam  Neurovascular status unchanged with keratotic lesion sub-first metatarsal right with mild redness but no proximal edema erythema or drainage was noted     Assessment:  Low grade ulceration right first metatarsal that's under reasonable control with compression and waiting for diabetic shoes to come     Plan:  Sterile debridement of the area accomplished with one small area of breakdown measuring about 3 x 5 mm. I debrided everything flushed it and applied Iodosorb and sterile dressing and instructed on reduced activity and continue home treatment of the area. Patient will be seen back 6 weeks and was given strict instructions of any redness or other issues were to occur that she needs to be seen back immediately

## 2017-10-22 ENCOUNTER — Encounter: Payer: Self-pay | Admitting: Internal Medicine

## 2017-11-02 ENCOUNTER — Telehealth: Payer: Self-pay | Admitting: Podiatry

## 2017-11-02 DIAGNOSIS — J4521 Mild intermittent asthma with (acute) exacerbation: Secondary | ICD-10-CM | POA: Diagnosis not present

## 2017-11-02 NOTE — Telephone Encounter (Signed)
Left message for pt that Dr Thea SilversmithMackenzie and Dr Pearson GrippeJames Kim signed paperwork for diabetic shoes and it has to be Dr Thea SilversmithMackenzie only unless we need to change paperwork to Dr Selena BattenKim.

## 2017-11-09 DIAGNOSIS — E538 Deficiency of other specified B group vitamins: Secondary | ICD-10-CM | POA: Diagnosis not present

## 2017-11-21 DIAGNOSIS — M1711 Unilateral primary osteoarthritis, right knee: Secondary | ICD-10-CM | POA: Diagnosis not present

## 2017-12-01 ENCOUNTER — Ambulatory Visit: Payer: Medicare Other | Admitting: Podiatry

## 2017-12-01 ENCOUNTER — Ambulatory Visit: Payer: Medicare Other | Admitting: Orthotics

## 2017-12-01 DIAGNOSIS — B351 Tinea unguium: Secondary | ICD-10-CM

## 2017-12-01 DIAGNOSIS — L84 Corns and callosities: Secondary | ICD-10-CM

## 2017-12-01 DIAGNOSIS — G609 Hereditary and idiopathic neuropathy, unspecified: Secondary | ICD-10-CM

## 2017-12-01 DIAGNOSIS — M79674 Pain in right toe(s): Secondary | ICD-10-CM

## 2017-12-01 DIAGNOSIS — L989 Disorder of the skin and subcutaneous tissue, unspecified: Secondary | ICD-10-CM

## 2017-12-01 DIAGNOSIS — M79675 Pain in left toe(s): Secondary | ICD-10-CM | POA: Diagnosis not present

## 2017-12-01 DIAGNOSIS — M2041 Other hammer toe(s) (acquired), right foot: Secondary | ICD-10-CM

## 2017-12-01 NOTE — Progress Notes (Signed)
Patient picked up custom diabetic shoes; I further offloaded callus/prevous ulcer area with a buttress pad.   She seemed to be well pleased with the shoes and inserts.   Advised her and daughter to monitor foot for any skin breakdown.

## 2017-12-01 NOTE — Progress Notes (Signed)
Subjective:   Patient ID: Paula LeekMary C Zill, female   DOB: 84 y.o.   MRN: [id number]   HPI Patient presents with incurvated nailbeds 1-5 both feet that get thick and painful when pressed with long-term idiopathic neuropathy and chronic callus formation plantar aspect first metatarsal head right with history of ulceration   ROS      Objective:  Physical Exam  Profound neuropathy with mycotic nail infection 1-5 both feet that get irritated with chronic lesion sub-first metatarsal right that is been ulcerated in the past     Assessment:  Mycotic nail infection 1-5 both feet with plantarflexed first metatarsal right with neuropathic changes and lesion formation     Plan:  Debridement of nailbeds 1-5 both feet with no iatrogenic bleeding and lesion right with no iatrogenic bleeding and reappoint to recheck

## 2017-12-03 DIAGNOSIS — J4521 Mild intermittent asthma with (acute) exacerbation: Secondary | ICD-10-CM | POA: Diagnosis not present

## 2017-12-12 DIAGNOSIS — E538 Deficiency of other specified B group vitamins: Secondary | ICD-10-CM | POA: Diagnosis not present

## 2017-12-14 DIAGNOSIS — M810 Age-related osteoporosis without current pathological fracture: Secondary | ICD-10-CM | POA: Diagnosis not present

## 2017-12-21 DIAGNOSIS — H401113 Primary open-angle glaucoma, right eye, severe stage: Secondary | ICD-10-CM | POA: Diagnosis not present

## 2017-12-21 DIAGNOSIS — H40012 Open angle with borderline findings, low risk, left eye: Secondary | ICD-10-CM | POA: Diagnosis not present

## 2017-12-21 DIAGNOSIS — H353121 Nonexudative age-related macular degeneration, left eye, early dry stage: Secondary | ICD-10-CM | POA: Diagnosis not present

## 2017-12-21 DIAGNOSIS — H04123 Dry eye syndrome of bilateral lacrimal glands: Secondary | ICD-10-CM | POA: Diagnosis not present

## 2017-12-21 DIAGNOSIS — Z961 Presence of intraocular lens: Secondary | ICD-10-CM | POA: Diagnosis not present

## 2017-12-28 DIAGNOSIS — M1711 Unilateral primary osteoarthritis, right knee: Secondary | ICD-10-CM | POA: Diagnosis not present

## 2017-12-28 DIAGNOSIS — M1710 Unilateral primary osteoarthritis, unspecified knee: Secondary | ICD-10-CM | POA: Insufficient documentation

## 2017-12-28 DIAGNOSIS — M179 Osteoarthritis of knee, unspecified: Secondary | ICD-10-CM | POA: Insufficient documentation

## 2017-12-29 DIAGNOSIS — H401112 Primary open-angle glaucoma, right eye, moderate stage: Secondary | ICD-10-CM | POA: Diagnosis not present

## 2017-12-29 DIAGNOSIS — H353121 Nonexudative age-related macular degeneration, left eye, early dry stage: Secondary | ICD-10-CM | POA: Diagnosis not present

## 2017-12-29 DIAGNOSIS — H40012 Open angle with borderline findings, low risk, left eye: Secondary | ICD-10-CM | POA: Diagnosis not present

## 2017-12-29 DIAGNOSIS — Z961 Presence of intraocular lens: Secondary | ICD-10-CM | POA: Diagnosis not present

## 2017-12-29 DIAGNOSIS — H04123 Dry eye syndrome of bilateral lacrimal glands: Secondary | ICD-10-CM | POA: Diagnosis not present

## 2017-12-31 DIAGNOSIS — J4521 Mild intermittent asthma with (acute) exacerbation: Secondary | ICD-10-CM | POA: Diagnosis not present

## 2018-01-11 DIAGNOSIS — M810 Age-related osteoporosis without current pathological fracture: Secondary | ICD-10-CM | POA: Diagnosis not present

## 2018-01-16 DIAGNOSIS — E538 Deficiency of other specified B group vitamins: Secondary | ICD-10-CM | POA: Diagnosis not present

## 2018-01-31 DIAGNOSIS — J4521 Mild intermittent asthma with (acute) exacerbation: Secondary | ICD-10-CM | POA: Diagnosis not present

## 2018-02-02 ENCOUNTER — Ambulatory Visit: Payer: Medicare Other | Admitting: Podiatry

## 2018-02-05 ENCOUNTER — Encounter: Payer: Self-pay | Admitting: Podiatry

## 2018-02-05 ENCOUNTER — Ambulatory Visit: Payer: Medicare Other | Admitting: Podiatry

## 2018-02-05 DIAGNOSIS — M79676 Pain in unspecified toe(s): Secondary | ICD-10-CM

## 2018-02-05 DIAGNOSIS — B351 Tinea unguium: Secondary | ICD-10-CM | POA: Diagnosis not present

## 2018-02-05 DIAGNOSIS — L84 Corns and callosities: Secondary | ICD-10-CM | POA: Diagnosis not present

## 2018-02-05 DIAGNOSIS — G609 Hereditary and idiopathic neuropathy, unspecified: Secondary | ICD-10-CM | POA: Diagnosis not present

## 2018-02-05 NOTE — Progress Notes (Signed)
Subjective:   Patient ID: [name], female   DOB: 84 y.o.   MRN: [id number]   HPI Patient presents stating that the nails are sore in the corners and she needs them trimmed and the lesion underneath the right foot is very tender and there was slight bleeding noted.   ROS      Objective:  Physical Exam  Neurological is reduced both sharp dull vibratory bilateral with history of neuropathy with significant keratotic lesion subsecond metatarsal right with slight redness indicating trauma and nail disease with thickness yellow brittle to be 1-5 both feet     Assessment:  Chronic lesion with neuropathy as precipitating factor pre-ulcerative minutes nature along with mycotic nail infection symptomatic 1-5 both feet     Plan:  Debride painful nailbeds 1-5 both feet.  Lesion iatrogenic bleeding noted

## 2018-02-08 DIAGNOSIS — H401112 Primary open-angle glaucoma, right eye, moderate stage: Secondary | ICD-10-CM | POA: Diagnosis not present

## 2018-02-08 DIAGNOSIS — H401113 Primary open-angle glaucoma, right eye, severe stage: Secondary | ICD-10-CM | POA: Diagnosis not present

## 2018-02-15 DIAGNOSIS — E538 Deficiency of other specified B group vitamins: Secondary | ICD-10-CM | POA: Diagnosis not present

## 2018-03-02 DIAGNOSIS — J4521 Mild intermittent asthma with (acute) exacerbation: Secondary | ICD-10-CM | POA: Diagnosis not present

## 2018-03-22 DIAGNOSIS — M1711 Unilateral primary osteoarthritis, right knee: Secondary | ICD-10-CM | POA: Diagnosis not present

## 2018-03-23 DIAGNOSIS — E1122 Type 2 diabetes mellitus with diabetic chronic kidney disease: Secondary | ICD-10-CM | POA: Diagnosis not present

## 2018-03-23 DIAGNOSIS — N390 Urinary tract infection, site not specified: Secondary | ICD-10-CM | POA: Diagnosis not present

## 2018-03-23 DIAGNOSIS — D509 Iron deficiency anemia, unspecified: Secondary | ICD-10-CM | POA: Diagnosis not present

## 2018-03-23 DIAGNOSIS — I10 Essential (primary) hypertension: Secondary | ICD-10-CM | POA: Diagnosis not present

## 2018-03-23 DIAGNOSIS — E538 Deficiency of other specified B group vitamins: Secondary | ICD-10-CM | POA: Diagnosis not present

## 2018-03-23 DIAGNOSIS — Z0000 Encounter for general adult medical examination without abnormal findings: Secondary | ICD-10-CM | POA: Diagnosis not present

## 2018-03-23 DIAGNOSIS — M109 Gout, unspecified: Secondary | ICD-10-CM | POA: Diagnosis not present

## 2018-03-23 DIAGNOSIS — E785 Hyperlipidemia, unspecified: Secondary | ICD-10-CM | POA: Diagnosis not present

## 2018-04-05 DIAGNOSIS — N183 Chronic kidney disease, stage 3 (moderate): Secondary | ICD-10-CM | POA: Diagnosis not present

## 2018-04-05 DIAGNOSIS — I10 Essential (primary) hypertension: Secondary | ICD-10-CM | POA: Diagnosis not present

## 2018-04-05 DIAGNOSIS — E785 Hyperlipidemia, unspecified: Secondary | ICD-10-CM | POA: Diagnosis not present

## 2018-04-05 DIAGNOSIS — I2510 Atherosclerotic heart disease of native coronary artery without angina pectoris: Secondary | ICD-10-CM | POA: Diagnosis not present

## 2018-04-05 DIAGNOSIS — I69354 Hemiplegia and hemiparesis following cerebral infarction affecting left non-dominant side: Secondary | ICD-10-CM | POA: Diagnosis not present

## 2018-04-05 DIAGNOSIS — Z0000 Encounter for general adult medical examination without abnormal findings: Secondary | ICD-10-CM | POA: Diagnosis not present

## 2018-04-05 DIAGNOSIS — E1122 Type 2 diabetes mellitus with diabetic chronic kidney disease: Secondary | ICD-10-CM | POA: Diagnosis not present

## 2018-04-17 ENCOUNTER — Encounter: Payer: Self-pay | Admitting: Sports Medicine

## 2018-04-17 ENCOUNTER — Ambulatory Visit: Payer: Medicare Other | Admitting: Sports Medicine

## 2018-04-17 DIAGNOSIS — B351 Tinea unguium: Secondary | ICD-10-CM

## 2018-04-17 DIAGNOSIS — L84 Corns and callosities: Secondary | ICD-10-CM | POA: Diagnosis not present

## 2018-04-17 DIAGNOSIS — L989 Disorder of the skin and subcutaneous tissue, unspecified: Secondary | ICD-10-CM | POA: Diagnosis not present

## 2018-04-17 DIAGNOSIS — M79676 Pain in unspecified toe(s): Secondary | ICD-10-CM

## 2018-04-17 DIAGNOSIS — G609 Hereditary and idiopathic neuropathy, unspecified: Secondary | ICD-10-CM

## 2018-04-17 DIAGNOSIS — I739 Peripheral vascular disease, unspecified: Secondary | ICD-10-CM

## 2018-04-17 NOTE — Progress Notes (Signed)
Subjective: [name] is a 84 y.o. female patient seen today in office with complaint of mildly painful thickened and elongated toenails and callus; unable to trim. Patient denies history of Diabetes but does have a vascular history and stroke history and some numbness to feet. Patient is assisted by daughter this visit. Patient has no other pedal complaints at this time.   Patient Active Problem List   Diagnosis Date Noted  . Upper airway cough syndrome 07/24/2017  . Cardioembolic stroke ([place]) 17/91/5056  . Coronary artery disease 11/12/2015  . Hyperlipidemia 11/12/2015  . TIA (transient ischemic attack)   . 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 NSTEMI (non-ST elevated myocardial infarction) ([place]) 08/18/2015  . History of stroke 07/15/2015  . Acute, but ill-defined, cerebrovascular disease ([place]) 07/15/2015  . CVA (cerebral infarction) 09/26/2012  . 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 Fall at home 09/26/2012  . UTI (lower urinary tract infection) 09/26/2012  . Hyperkalemia 09/26/2012  . CKD (chronic kidney disease), stage III ([place]) 09/26/2012  . Essential hypertension     Current Outpatient Medications on File Prior to Visit  Medication Sig Dispense Refill  . Acetaminophen (TYLENOL) 325 MG CAPS Take 325 mg by mouth 3 (three) times daily as needed.    Marland Kitchen amoxicillin (AMOXIL) 500 MG capsule TAKE 4 CAPSULES BY MOUTH 1 HOUR PRIOR TO APPOINTMENT  0  . aspirin 81 MG chewable tablet Chew 81 mg by mouth daily.    Marland Kitchen atorvastatin (LIPITOR) 80 MG tablet Take 1 tablet (80 mg total) by mouth daily at 6 PM. 60 tablet 0  . bimatoprost (LUMIGAN) 0.01 % SOLN Place 1 drop into the right eye at bedtime.    . brimonidine-timolol (COMBIGAN) 0.2-0.5 % ophthalmic solution Place 1 drop into both eyes every 12 (twelve) hours.    . Calcium Carbonate-Vitamin D (CALTRATE 600+D PO) Take 1 tablet by mouth 2 (two) times daily.    . chlorpheniramine  (CHLOR-TRIMETON) 4 MG tablet Take 4 mg by mouth every 4 (four) hours as needed for allergies.    . famotidine (PEPCID) 20 MG tablet Take 20 mg by mouth daily.    Marland Kitchen losartan (COZAAR) 50 MG tablet Take 1 tablet (50 mg total) by mouth daily. 60 tablet 0  . metoprolol (LOPRESSOR) 50 MG tablet Take 25 mg by mouth 2 (two) times daily.    . Multiple Vitamins-Minerals (MULTIPLE VITAMINS/WOMENS PO) Take 1 tablet by mouth daily. W/vitamin D    . Netarsudil Dimesylate (RHOPRESSA) 0.02 % SOLN Rhopressa 0.02 % eye drops  INSTILL 1 DROP INTO AFFECTED EYE(S) BY OPHTHALMIC ROUTE ONCE DAILY INTHE EVENING    . ofloxacin (OCUFLOX) 0.3 % ophthalmic solution INSTILL 1 DROP INTO RIGHT EYE FOUR TIMES A DAY STARTING 1 DAY BEFORE SURGERY  1  . pantoprazole (PROTONIX) 40 MG tablet Take 1 tablet (40 mg total) by mouth daily. Take 30-60 min before first meal of the day 30 tablet 2  . Polyethyl Glycol-Propyl Glycol (SYSTANE OP) Apply 1 drop to eye 2 (two) times daily.    . prednisoLONE acetate (PRED FORTE) 1 % ophthalmic suspension INSTILL 1 DROP INTO RIGHT EYE FOUR TIMES A DAY AFTER SURGERY  1  . triamcinolone cream (KENALOG) 0.1 % Apply 1 application topically daily as needed. For rash 30 g 1   No current facility-administered medications on file prior to visit.     Allergies  Allergen Reactions  . Meloxicam Hives    Objective:  Physical Exam  General: Well developed, nourished, no acute distress, awake, alert and oriented x 3  Vascular: Dorsalis pedis artery 1/4 bilateral, Posterior tibial artery 0/4 bilateral due to 1+ pitting edema, skin temperature warm to cool proximal to distal bilateral lower extremities, ++ varicosities,- pedal hair present bilateral.  Neurological: Gross sensation present via light touch bilateral. Protective sensation severely diminished bilateral.  Dermatological: Skin is warm, dry, and supple bilateral, Nails 1-10 are tender, long, thick, and discolored with mild subungal debris, no  webspace macerations present bilateral, no open lesions present bilateral, + callus/hyperkeratotic tissue present sub met 2 on right. No signs of infection bilateral.  Musculoskeletal: Asymptomatic bunion and hammertoe boney deformities noted bilateral. Muscular strength normal for patient age and status. No pain with calf compression bilateral.  Assessment and Plan:  Problem List Items Addressed This Visit    None    Visit Diagnoses    Pain due to onychomycosis of toenail    -  Primary   Idiopathic peripheral neuropathy       PVD (peripheral vascular disease) (HCC)       Pre-ulcerative calluses       Benign skin lesion          -Examined patient.  -Discussed treatment options for painful mycotic nails and callus. -Mechanically debrided callus x 1 using sterile chisel blade on right and reduced mycotic nails with sterile nail nipper and dremel nail file without incident. -Encouraged elevation to assist with edema control -Continue with custom shoes with offloading insoles  -Patient to return in 3 months for follow up evaluation or sooner if symptoms worsen.  Landis Martins, DPM

## 2018-04-20 DIAGNOSIS — H401112 Primary open-angle glaucoma, right eye, moderate stage: Secondary | ICD-10-CM | POA: Diagnosis not present

## 2018-04-26 DIAGNOSIS — E538 Deficiency of other specified B group vitamins: Secondary | ICD-10-CM | POA: Diagnosis not present

## 2018-05-03 DIAGNOSIS — M25512 Pain in left shoulder: Secondary | ICD-10-CM | POA: Insufficient documentation

## 2018-05-31 DIAGNOSIS — E538 Deficiency of other specified B group vitamins: Secondary | ICD-10-CM | POA: Diagnosis not present

## 2018-06-14 DIAGNOSIS — M1711 Unilateral primary osteoarthritis, right knee: Secondary | ICD-10-CM | POA: Diagnosis not present

## 2018-07-05 DIAGNOSIS — Z23 Encounter for immunization: Secondary | ICD-10-CM | POA: Diagnosis not present

## 2018-07-05 DIAGNOSIS — E538 Deficiency of other specified B group vitamins: Secondary | ICD-10-CM | POA: Diagnosis not present

## 2018-07-05 DIAGNOSIS — N390 Urinary tract infection, site not specified: Secondary | ICD-10-CM | POA: Diagnosis not present

## 2018-07-16 ENCOUNTER — Encounter: Payer: Self-pay | Admitting: Podiatry

## 2018-07-16 ENCOUNTER — Ambulatory Visit: Payer: Medicare Other | Admitting: Podiatry

## 2018-07-16 DIAGNOSIS — M79675 Pain in left toe(s): Secondary | ICD-10-CM | POA: Diagnosis not present

## 2018-07-16 DIAGNOSIS — B351 Tinea unguium: Secondary | ICD-10-CM | POA: Diagnosis not present

## 2018-07-16 DIAGNOSIS — L84 Corns and callosities: Secondary | ICD-10-CM

## 2018-07-16 DIAGNOSIS — I739 Peripheral vascular disease, unspecified: Secondary | ICD-10-CM

## 2018-07-16 DIAGNOSIS — M79674 Pain in right toe(s): Secondary | ICD-10-CM | POA: Diagnosis not present

## 2018-07-16 DIAGNOSIS — G609 Hereditary and idiopathic neuropathy, unspecified: Secondary | ICD-10-CM

## 2018-07-16 DIAGNOSIS — M79676 Pain in unspecified toe(s): Secondary | ICD-10-CM

## 2018-07-16 NOTE — Progress Notes (Signed)
Subjective:   Patient ID: [name], female   DOB: 84 y.o.   MRN: [id number]   HPI Patient presents with a very painful lesion underneath the right foot and nail disease 1-5 both feet that are thickened incurvated and she cannot cut and presents with caregiver   ROS      Objective:  Physical Exam  Neurovascular status intact with thick brittle nailbeds 1-5 both feet that are painful and keratotic lesion subsecond metatarsal right is very painful and hard to walk with     Assessment:  Chronic mycotic nail infection 1-5 both feet with lesion formation right that is painful     Plan:  Sterile debridement of nailbeds 1-5 both feet and lesion right with no iatrogenic bleeding noted

## 2018-07-19 DIAGNOSIS — M810 Age-related osteoporosis without current pathological fracture: Secondary | ICD-10-CM | POA: Diagnosis not present

## 2018-08-09 DIAGNOSIS — E538 Deficiency of other specified B group vitamins: Secondary | ICD-10-CM | POA: Diagnosis not present

## 2018-08-21 DIAGNOSIS — H40012 Open angle with borderline findings, low risk, left eye: Secondary | ICD-10-CM | POA: Diagnosis not present

## 2018-08-21 DIAGNOSIS — Z961 Presence of intraocular lens: Secondary | ICD-10-CM | POA: Diagnosis not present

## 2018-08-21 DIAGNOSIS — H04123 Dry eye syndrome of bilateral lacrimal glands: Secondary | ICD-10-CM | POA: Diagnosis not present

## 2018-08-21 DIAGNOSIS — H401112 Primary open-angle glaucoma, right eye, moderate stage: Secondary | ICD-10-CM | POA: Diagnosis not present

## 2018-08-21 DIAGNOSIS — H353121 Nonexudative age-related macular degeneration, left eye, early dry stage: Secondary | ICD-10-CM | POA: Diagnosis not present

## 2018-09-14 DIAGNOSIS — E538 Deficiency of other specified B group vitamins: Secondary | ICD-10-CM | POA: Diagnosis not present

## 2018-09-14 DIAGNOSIS — R300 Dysuria: Secondary | ICD-10-CM | POA: Diagnosis not present

## 2018-09-14 DIAGNOSIS — N390 Urinary tract infection, site not specified: Secondary | ICD-10-CM | POA: Diagnosis not present

## 2018-09-14 DIAGNOSIS — E1122 Type 2 diabetes mellitus with diabetic chronic kidney disease: Secondary | ICD-10-CM | POA: Diagnosis not present

## 2018-09-14 DIAGNOSIS — M109 Gout, unspecified: Secondary | ICD-10-CM | POA: Diagnosis not present

## 2018-09-14 DIAGNOSIS — E611 Iron deficiency: Secondary | ICD-10-CM | POA: Diagnosis not present

## 2018-09-20 DIAGNOSIS — I5032 Chronic diastolic (congestive) heart failure: Secondary | ICD-10-CM | POA: Diagnosis not present

## 2018-09-20 DIAGNOSIS — E1122 Type 2 diabetes mellitus with diabetic chronic kidney disease: Secondary | ICD-10-CM | POA: Diagnosis not present

## 2018-09-20 DIAGNOSIS — E785 Hyperlipidemia, unspecified: Secondary | ICD-10-CM | POA: Diagnosis not present

## 2018-09-20 DIAGNOSIS — M109 Gout, unspecified: Secondary | ICD-10-CM | POA: Diagnosis not present

## 2018-09-21 ENCOUNTER — Ambulatory Visit: Payer: Medicare Other | Admitting: Podiatry

## 2018-09-21 ENCOUNTER — Encounter: Payer: Self-pay | Admitting: Podiatry

## 2018-09-21 DIAGNOSIS — B351 Tinea unguium: Secondary | ICD-10-CM

## 2018-09-21 DIAGNOSIS — M79674 Pain in right toe(s): Secondary | ICD-10-CM

## 2018-09-21 DIAGNOSIS — G609 Hereditary and idiopathic neuropathy, unspecified: Secondary | ICD-10-CM

## 2018-09-21 DIAGNOSIS — L84 Corns and callosities: Secondary | ICD-10-CM | POA: Diagnosis not present

## 2018-09-21 DIAGNOSIS — M79675 Pain in left toe(s): Secondary | ICD-10-CM | POA: Diagnosis not present

## 2018-09-21 NOTE — Patient Instructions (Signed)

## 2018-09-26 DIAGNOSIS — M1711 Unilateral primary osteoarthritis, right knee: Secondary | ICD-10-CM | POA: Diagnosis not present

## 2018-10-16 DIAGNOSIS — H04123 Dry eye syndrome of bilateral lacrimal glands: Secondary | ICD-10-CM | POA: Diagnosis not present

## 2018-10-16 DIAGNOSIS — Z961 Presence of intraocular lens: Secondary | ICD-10-CM | POA: Diagnosis not present

## 2018-10-16 DIAGNOSIS — H401112 Primary open-angle glaucoma, right eye, moderate stage: Secondary | ICD-10-CM | POA: Diagnosis not present

## 2018-10-16 DIAGNOSIS — H40012 Open angle with borderline findings, low risk, left eye: Secondary | ICD-10-CM | POA: Diagnosis not present

## 2018-10-16 DIAGNOSIS — H353121 Nonexudative age-related macular degeneration, left eye, early dry stage: Secondary | ICD-10-CM | POA: Diagnosis not present

## 2018-10-22 ENCOUNTER — Emergency Department (HOSPITAL_BASED_OUTPATIENT_CLINIC_OR_DEPARTMENT_OTHER): Payer: Medicare Other

## 2018-10-22 ENCOUNTER — Other Ambulatory Visit: Payer: Self-pay

## 2018-10-22 ENCOUNTER — Emergency Department (HOSPITAL_COMMUNITY)
Admission: EM | Admit: 2018-10-22 | Discharge: 2018-10-22 | Disposition: A | Discharge status: Home / Self Care | Payer: Medicare Other | Attending: Emergency Medicine | Admitting: Emergency Medicine

## 2018-10-22 ENCOUNTER — Encounter (HOSPITAL_COMMUNITY): Payer: Self-pay

## 2018-10-22 DIAGNOSIS — Z8673 Personal history of transient ischemic attack (TIA), and cerebral infarction without residual deficits: Secondary | ICD-10-CM | POA: Diagnosis not present

## 2018-10-22 DIAGNOSIS — Z7982 Long term (current) use of aspirin: Secondary | ICD-10-CM | POA: Diagnosis not present

## 2018-10-22 DIAGNOSIS — I878 Other specified disorders of veins: Secondary | ICD-10-CM | POA: Insufficient documentation

## 2018-10-22 DIAGNOSIS — R600 Localized edema: Secondary | ICD-10-CM | POA: Diagnosis not present

## 2018-10-22 DIAGNOSIS — N183 Chronic kidney disease, stage 3 (moderate): Secondary | ICD-10-CM | POA: Diagnosis not present

## 2018-10-22 DIAGNOSIS — R609 Edema, unspecified: Secondary | ICD-10-CM

## 2018-10-22 DIAGNOSIS — E785 Hyperlipidemia, unspecified: Secondary | ICD-10-CM | POA: Diagnosis not present

## 2018-10-22 DIAGNOSIS — I252 Old myocardial infarction: Secondary | ICD-10-CM | POA: Insufficient documentation

## 2018-10-22 DIAGNOSIS — I2510 Atherosclerotic heart disease of native coronary artery without angina pectoris: Secondary | ICD-10-CM | POA: Diagnosis not present

## 2018-10-22 DIAGNOSIS — Z79899 Other long term (current) drug therapy: Secondary | ICD-10-CM | POA: Insufficient documentation

## 2018-10-22 DIAGNOSIS — I129 Hypertensive chronic kidney disease with stage 1 through stage 4 chronic kidney disease, or unspecified chronic kidney disease: Secondary | ICD-10-CM | POA: Diagnosis not present

## 2018-10-22 DIAGNOSIS — I872 Venous insufficiency (chronic) (peripheral): Secondary | ICD-10-CM | POA: Diagnosis not present

## 2018-10-22 DIAGNOSIS — R2242 Localized swelling, mass and lump, left lower limb: Secondary | ICD-10-CM | POA: Diagnosis not present

## 2018-10-22 DIAGNOSIS — M7989 Other specified soft tissue disorders: Secondary | ICD-10-CM | POA: Diagnosis not present

## 2018-10-22 LAB — BASIC METABOLIC PANEL
Anion gap: 9 (ref 5–15)
BUN: 35 mg/dL — ABNORMAL HIGH (ref 8–23)
CO2: 19 mmol/L — ABNORMAL LOW (ref 22–32)
CREATININE: 1.21 mg/dL — AB (ref 0.44–1.00)
Calcium: 8.7 mg/dL — ABNORMAL LOW (ref 8.9–10.3)
Chloride: 109 mmol/L (ref 98–111)
GFR calc Af Amer: 45 mL/min — ABNORMAL LOW (ref >60)
GFR calc non Af Amer: 39 mL/min — ABNORMAL LOW (ref >60)
Glucose, Bld: 98 mg/dL (ref 70–99)
Potassium: 4.5 mmol/L (ref 3.5–5.1)
Sodium: 137 mmol/L (ref 135–145)

## 2018-10-22 LAB — CBC
HCT: 33.1 % — ABNORMAL LOW (ref 36.0–46.0)
Hemoglobin: 10 g/dL — ABNORMAL LOW (ref 12.0–15.0)
MCH: 30.1 pg (ref 26.0–34.0)
MCHC: 30.2 g/dL (ref 30.0–36.0)
MCV: 99.7 fL (ref 80.0–100.0)
NRBC: 0 % (ref 0.0–0.2)
Platelets: 254 10*3/uL (ref 150–400)
RBC: 3.32 MIL/uL — AB (ref 3.87–5.11)
RDW: 13.6 % (ref 11.5–15.5)
WBC: 6.3 10*3/uL (ref 4.0–10.5)

## 2018-10-22 MED ORDER — CEPHALEXIN 500 MG PO CAPS: 500.0000 mg | ORAL_CAPSULE | Freq: Four times a day (QID) | ORAL | 0 refills | Status: AC

## 2018-10-22 NOTE — ED Notes (Signed)
VAS US advised the pt is negative for DVTs.

## 2018-10-22 NOTE — Progress Notes (Signed)
Left lower extremity venous duplex exam completed. Please see preliminary notes on CV PROC under chart review. Alajia Schmelzer H Bayan Kushnir(RDMS RVT) 10/22/18 7:17 PM

## 2018-10-22 NOTE — ED Triage Notes (Signed)
Pt here with acute leg swelling that began yesterday, saw PCP today and wanted them to come in evaluation.  No shortness of breath or pain otherwise.

## 2018-10-22 NOTE — ED Provider Notes (Signed)
[facility] EMERGENCY DEPARTMENT Provider Note   CSN: [id number] Arrival date & time: 10/22/18  1454   History   Chief Complaint Chief Complaint  Patient presents with  . Leg Swelling    HPI [name] is a 85 y.o. female.  HPI   [name] is a 85 y.o. female with PMH of anemia, arthritis, gout, HTN, history of NSTEMI, stroke who presents with left lower extremity pain and erythema as well as swelling.  She reports that about 2 to 3 weeks ago she was diagnosed with gout in her left first toe and had pain radiating across her left lower foot from her left toe.  Was given a topical ointment which cleared up her pain.  However, she has had some swelling and erythema that has worsened in her left lower extremity over the past 3 to 5 days.  She has chronic pain in this left lower extremity and ambulates only minimal amount at baseline.  Has home health that comes and sees her regularly and was concerned that her left lower extremity appeared to be more swollen.  They report they check her weight daily and she has not had any change in her baseline weight.  No chest pain or dyspnea including PND or orthopnea.  Eating and drinking normally.  No recent falls or trauma.  She has had no fevers or chills.  PCP recommended she come to the ED for further evaluation.  Past Medical History:  Diagnosis Date  . Anemia   . Arthritis   . Gout, joint   . Hypertension   . NSTEMI (non-ST elevated myocardial infarction) (HCC) 08/2015  . Stroke [facility])     Patient Active Problem List   Diagnosis Date Noted  . Pain in joint of left shoulder 05/03/2018  . Osteoarthritis of knee 12/28/2017  . Upper airway cough syndrome 07/24/2017  . Cardioembolic stroke (HCC) 11/13/2015  . Coronary artery disease 11/12/2015  . Hyperlipidemia 11/12/2015  . TIA (transient ischemic attack)   . 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 NSTEMI (non-ST  elevated myocardial infarction) (HCC) 08/18/2015  . History of stroke 07/15/2015  . Acute, but ill-defined, cerebrovascular disease (HCC) 07/15/2015  . CVA (cerebral infarction) 09/26/2012  . 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 Fall at home 09/26/2012  . UTI (lower urinary tract infection) 09/26/2012  . Hyperkalemia 09/26/2012  . CKD (chronic kidney disease), stage III (HCC) 09/26/2012  . Essential hypertension     Past Surgical History:  Procedure Laterality Date  . ABDOMINAL HYSTERECTOMY    . BREAST SURGERY     CYST  . CARDIAC CATHETERIZATION N/A 08/18/2015   Procedure: Left Heart Cath and Coronary Angiography;  Surgeon: Lennette Bihari, MD;  Location[facility] INVASIVE CV LAB;  Service: Cardiovascular;  Laterality: N/A;  . CARDIAC CATHETERIZATION  08/18/2015   Procedure: Coronary Stent Intervention;  Surgeon: Lennette Bihari, MD;  Location: [facility] INVASIVE CV LAB;  Service: Cardiovascular;;  . DILATION AND CURETTAGE OF UTERUS    . EYE SURGERY  both  . HERNIA REPAIR    . JOINT REPLACEMENT  left knee  . REFRACTIVE SURGERY Right 2017     OB History   No obstetric history on file.      Home Medications    Prior to Admission medications   Medication Sig Start Date End Date Taking? Authorizing Provider  Acetaminophen (TYLENOL) 325 MG CAPS Take 325 mg by mouth  3 (three) times daily as needed.    [provider]  amoxicillin (AMOXIL) 500 MG capsule TAKE 4 CAPSULES BY MOUTH 1 HOUR PRIOR TO APPOINTMENT 11/27/17   [provider]  amoxicillin (AMOXIL) 500 MG capsule amoxicillin 500 mg capsule  TAKE 4 CAPSULES 1 HOUR BEFORE PROCEDURE    [provider]  aspirin 81 MG chewable tablet Chew 81 mg by mouth daily.    [provider]  atorvastatin (LIPITOR) 80 MG tablet Take 1 tablet (80 mg total) by mouth daily at 6 PM. 08/20/15   Ghimire, Werner Lean, MD  bimatoprost (LUMIGAN) 0.01 % SOLN Place 1 drop into the right eye at bedtime.    [provider]    brimonidine-timolol (COMBIGAN) 0.2-0.5 % ophthalmic solution Place 1 drop into both eyes every 12 (twelve) hours.    [provider]  Calcium Carbonate-Vitamin D (CALTRATE 600+D PO) Take 1 tablet by mouth 2 (two) times daily.    [provider]  cephALEXin (KEFLEX) 500 MG capsule Take 1 capsule (500 mg total) by mouth 4 (four) times daily for 7 days. If symptoms worsen in the next 24-48 hours 10/22/18 10/29/18  Naasia Weilbacher, Rhona Leavens II, MD  chlorpheniramine (CHLOR-TRIMETON) 4 MG tablet Take 4 mg by mouth every 4 (four) hours as needed for allergies.    [provider]  dorzolamide-timolol (COSOPT) 22.3-6.8 MG/ML ophthalmic solution dorzolamide 22.3 mg-timolol 6.8 mg/mL eye drops    [provider]  famotidine (PEPCID) 20 MG tablet Take 20 mg by mouth daily.    [provider]  furosemide (LASIX) 20 MG tablet furosemide 20 mg tablet  TAKE 1 TABLET BY MOUTH EVERY DAY    [provider]  latanoprost (XALATAN) 0.005 % ophthalmic solution latanoprost 0.005 % eye drops  INSTILL 1 DROP INTO RIGHT EYE AT BEDTIME    [provider]  losartan (COZAAR) 50 MG tablet Take 1 tablet (50 mg total) by mouth daily. 08/20/15   Ghimire, Werner Lean, MD  metoprolol (LOPRESSOR) 50 MG tablet Take 25 mg by mouth 2 (two) times daily.    [provider]  Multiple Vitamins-Minerals (MULTIPLE VITAMINS/WOMENS PO) Take 1 tablet by mouth daily. W/vitamin D    [provider]  Netarsudil Dimesylate (RHOPRESSA) 0.02 % SOLN Rhopressa 0.02 % eye drops  INSTILL 1 DROP INTO AFFECTED EYE(S) BY OPHTHALMIC ROUTE ONCE DAILY INTHE EVENING    [provider]  ofloxacin (OCUFLOX) 0.3 % ophthalmic solution INSTILL 1 DROP INTO RIGHT EYE FOUR TIMES A DAY STARTING 1 DAY BEFORE SURGERY 12/29/17   [provider]  pantoprazole (PROTONIX) 40 MG tablet Take 1 tablet (40 mg total) by mouth daily. Take 30-60 min before first meal of the day 07/24/17   Nyoka Cowden, MD  Polyethyl Glycol-Propyl Glycol (SYSTANE OP) Apply 1 drop to eye 2 (two) times daily.    [provider]  prednisoLONE acetate (PRED FORTE) 1 % ophthalmic suspension INSTILL 1 DROP INTO RIGHT EYE FOUR TIMES A DAY AFTER SURGERY 12/29/17   [provider]  triamcinolone cream (KENALOG) 0.1 % Apply 1 application topically daily as needed. For rash 08/28/15   Leone Brand, NP    Family History Family History  Problem Relation Age of Onset  . Heart disease Mother   . Stroke Mother     Social History Social History   Tobacco Use  . Smoking status: Never Smoker  . Smokeless tobacco: Never Used  Substance Use Topics  . Alcohol use:  No    Alcohol/week: 0.0 standard drinks  . Drug use: No     Allergies   Meloxicam and Other   Review of Systems Review of Systems  Constitutional: Negative for chills and fever.  HENT: Negative for ear pain and sore throat.   Eyes: Negative for pain and visual disturbance.  Respiratory: Negative for cough and shortness of breath.   Cardiovascular: Positive for leg swelling. Negative for chest pain and palpitations.  Gastrointestinal: Negative for abdominal pain and vomiting.  Genitourinary: Negative for dysuria and hematuria.  Musculoskeletal: Positive for arthralgias and myalgias. Negative for back pain.  Skin: Positive for color change. Negative for rash and wound.  Neurological: Negative for seizures and syncope.  All other systems reviewed and are negative.   Physical Exam Updated Vital Signs BP (!) 167/68   Pulse 77   Temp (!) 97.4 F (36.3 C) (Oral)   Resp 20   SpO2 99%   Physical Exam Vitals signs and nursing note reviewed.  Constitutional:      General: She is not in acute distress.    Appearance: Normal appearance. She is well-developed. She is not ill-appearing.  HENT:     Head: Normocephalic and atraumatic.  Eyes:     Conjunctiva/sclera: Conjunctivae normal.  Neck:     Musculoskeletal:  Neck supple.  Cardiovascular:     Rate and Rhythm: Normal rate and regular rhythm.     Pulses:          Dorsalis pedis pulses are 1+ on the right side and 1+ on the left side.     Heart sounds: No murmur.  Pulmonary:     Effort: Pulmonary effort is normal. No respiratory distress.     Breath sounds: Normal breath sounds.  Abdominal:     Palpations: Abdomen is soft.     Tenderness: There is no abdominal tenderness.  Musculoskeletal:        General: Swelling and tenderness present.     Right lower leg: Edema (trace to 1+) present.     Left lower leg: Left lower leg edema: 2+ pitting to level of left knee.     Comments: Tenderness to palpation about the left lower extremity mostly around the gastrocnemius and left anterior tibialis anterior muscle.  Compartment soft.  No crepitus or fluctuance.  No significant induration.  Overlying erythema present.  Skin:    General: Skin is warm and dry.     Findings: Erythema (LLE, mild to moderate erythema overlying the anterior and posterior calf as well as mid shin.  Mild erythema overlying the distal anterior and posterior left lower extremity.) present.  Neurological:     Mental Status: She is alert.  Psychiatric:        Behavior: Behavior is cooperative.     ED Treatments / Results  Labs (all labs ordered are listed, but only abnormal results are displayed) Labs Reviewed  CBC - Abnormal; Notable for the following components:      Result Value   RBC 3.32 (*)    Hemoglobin 10.0 (*)    HCT 33.1 (*)    All other components within normal limits  BASIC METABOLIC PANEL - Abnormal; Notable for the following components:   CO2 19 (*)    BUN 35 (*)    Creatinine, Ser 1.21 (*)    Calcium 8.7 (*)    GFR calc non Af Amer 39 (*)    GFR calc Af Amer 45 (*)    All other components within  normal limits    EKG EKG Interpretation  Date/Time:  Monday October 22 2018 18:16:07 EST Ventricular Rate:  70 PR Interval:    QRS Duration: 93 QT  Interval:  413 QTC Calculation: 446 R Axis:   69 Text Interpretation:  Sinus rhythm No significant change since last tracing Confirmed by Melene PlanFloyd, Dan [phone]) on 10/22/2018 6:37:50 PM   Radiology Vas [place] Lower Extremity Venous (dvt) (only [facility])  Result Date: 10/22/2018  Lower Venous Study Indications: Swelling, and Edema.  Limitations: Body habitus and edema. Comparison Study: No comparisons available Performing Technologist: Melodie BouillonSelina Cole  Examination Guidelines: A complete evaluation includes B-mode imaging, spectral Doppler, color Doppler, and power Doppler as needed of all accessible portions of each vessel. Bilateral testing is considered an integral part of a complete examination. Limited examinations for reoccurring indications may be performed as noted.  Right Venous Findings: +---+---------------+---------+-----------+----------+-------+    CompressibilityPhasicitySpontaneityPropertiesSummary +---+---------------+---------+-----------+----------+-------+ CFVFull           Yes      Yes                          +---+---------------+---------+-----------+----------+-------+  Left Venous Findings: +---------+---------------+---------+-----------+----------+-------------------+          CompressibilityPhasicitySpontaneityPropertiesSummary             +---------+---------------+---------+-----------+----------+-------------------+ CFV      Full           Yes      Yes                                      +---------+---------------+---------+-----------+----------+-------------------+ SFJ      Full                                                             +---------+---------------+---------+-----------+----------+-------------------+ FV Prox  Full                                                             +---------+---------------+---------+-----------+----------+-------------------+ FV Mid   Full                                                              +---------+---------------+---------+-----------+----------+-------------------+ FV DistalFull                                                             +---------+---------------+---------+-----------+----------+-------------------+ PFV      Full                                                             +---------+---------------+---------+-----------+----------+-------------------+  POP      Full           Yes      Yes                                      +---------+---------------+---------+-----------+----------+-------------------+ PTV      Full                    Yes                  poor visibility                                                           with edema          +---------+---------------+---------+-----------+----------+-------------------+ PERO     Full                    Yes                  poor visibility                                                           with edema          +---------+---------------+---------+-----------+----------+-------------------+    Summary: Right: No evidence of common femoral vein obstruction. Left: There is no evidence of deep vein thrombosis in the lower extremity. However, portions of this examination were limited- see technologist comments above. No cystic structure found in the popliteal fossa.  *See table(s) above for measurements and observations.    Preliminary     Procedures Procedures (including critical care time)  Medications Ordered in ED Medications - No data to display   Initial Impression / Assessment and Plan / ED Course  I have reviewed the triage vital signs and the nursing notes.  Pertinent labs & imaging results that were available during my care of the patient were reviewed by me and considered in my medical decision making (see chart for details).      MDM:  Imaging: Duplex of the left lower extremity negative  ED Provider Interpretation of EKG: None indicated at  this time.    Labs: CBC and BMP unremarkable  On arrival, patient appears chronically ill but stable. Afebrile and hemodynamically stable. Alert and oriented x4, pleasant, and cooperative.  Presents with lower extremity swelling states on above.   On exam, patient has pitting edema with mild overlying erythema to the left lower extremity as detailed above.  Tenderness to palpation in the posterior gastrocnemius and along the area of the erythema but no crepitus or induration.  No fluctuance.  No palpable cords.  Concern for possible DVT given patient's decreased mobility at baseline.  Duplex study is negative.  Neurovascular intact distally.   Patient has no known history of CHF but echo on 08/19/2015 shows LVEF of 65 to 70% with diastolic dysfunction.  Elevated PA pressure at 60 mmHg.  Clinically, no evidence for volume overload other than lower extremity edema which we suspect is likely  secondary to venous stasis and her left lower extremity symptoms are most consistent with venous stasis dermatitis.  Lungs are clear bilaterally and sats are normal on room air.  No tachycardia no chest pain or dyspnea including PND orthopnea at home.  Low suspicion for CHF.  Patient does have tenderness about the area of erythema and although she has no clear evidence for significant cellulitis at this time she has had worsening erythema in the last few days with pain.  Patient was given a prescription for 7-day course of Keflex to be taken if her symptoms worsen next week for 48 hours.  She was counseled to follow-up with her PCP for close reevaluation and was given very strict return precautions.  The plan for this patient was discussed with Dr. Floyd who voiced agreemAdela Lankent and who oversaw evaluation and treatment of this patient.   The patient was fully informed and involved with the history taking, evaluation, workup including labs/images, and plan. The patient's concerns and questions were addressed to the patient's  satisfaction and she expressed agreement with the plan to DC home.    Final Clinical Impressions(s) / ED Diagnoses   Final diagnoses:  Peripheral edema  Left leg swelling  Venous stasis of lower extremity    ED Discharge Orders         Ordered    cephALEXin (KEFLEX) 500 MG capsule  4 times daily     10/22/18 1954           Ariel Dimitri, Sherryle LisJames F II, MD 10/23/18 0012    Melene PlanFloyd, Dan, DO 10/23/18 1514

## 2018-10-22 NOTE — ED Notes (Signed)
Family reports that she was treated for gout of the toe of her left foot 2 weeks ago. She was tx w/ an "ointment" and the gout symptoms resolved. One week ago she began to experience pain in her left leg w/ BLE noted. Family denied weight gain/change.

## 2018-10-25 ENCOUNTER — Encounter: Payer: Self-pay | Admitting: Podiatry

## 2018-10-25 DIAGNOSIS — E538 Deficiency of other specified B group vitamins: Secondary | ICD-10-CM | POA: Diagnosis not present

## 2018-10-25 NOTE — Progress Notes (Signed)
Subjective: [name] presents today, accompanied by her daughter, for treatment of chronic, painful callus plantar right foot.  She also has painful, thick toenails 1-5 b/l that she cannot cut and which interfere with daily activities.  Pain is aggravated when wearing enclosed shoe gear.  [name], MD is her PCP.  Allergies  Allergen Reactions  . Meloxicam Hives  . Other     Objective:  Vascular Examination: Capillary refill time immediate x 10 digits  Dorsalis pedis and Posterior tibial pulses palpable faintly b/l  Digital hair absent x 10 digits  Skin temperature gradient WNL b/l  Dermatological Examination: Skin thin and atrophied b/l LE  Toenails 1-5 b/l discolored, thick, dystrophic with subungual debris and pain with palpation to nailbeds due to thickness of nails.  Preulcerative callus noted submetatarsal head 2nd right foot with subdermal hemorrhage. No edema, no erythema, no drainage, no flocculence, no warmth.  Musculoskeletal: Muscle strength 5/5 to all LE muscle groups  HAV deformity right foot  Hammertoe 2nd digit right foot  No pain, crepitus or joint limitation noted with ROM.   Neurological: Sensation diminished with 10 gram monofilament.  Assessment: 1. Painful onychomycosis toenails 1-5 b/l  2. Preulcerative callus submetatarsal head 2nd right foot 3. Peripheral neuropathy  Plan: 1. Toenails 1-5 b/l were debrided in length and girth without iatrogenic bleeding. 2. Our Pedorthist, Ria Clock, modified her right shoe with moleskin to reduce pressure on plantar callus. Patient to continue soft, supportive shoe gear. She will need new pair of shoes for next year. Arrange with Pedorthist. 3. Patient to report any pedal injuries to medical professional immediately. 4. Follow up 6 weeks. Patient/POA to call should there be a concern in the interim.

## 2018-11-01 DIAGNOSIS — E1122 Type 2 diabetes mellitus with diabetic chronic kidney disease: Secondary | ICD-10-CM | POA: Diagnosis not present

## 2018-11-01 DIAGNOSIS — I129 Hypertensive chronic kidney disease with stage 1 through stage 4 chronic kidney disease, or unspecified chronic kidney disease: Secondary | ICD-10-CM | POA: Diagnosis not present

## 2018-11-01 DIAGNOSIS — M79669 Pain in unspecified lower leg: Secondary | ICD-10-CM | POA: Diagnosis not present

## 2018-11-21 ENCOUNTER — Ambulatory Visit: Payer: Medicare Other | Admitting: Podiatry

## 2018-11-21 ENCOUNTER — Encounter: Payer: Self-pay | Admitting: Podiatry

## 2018-11-21 DIAGNOSIS — L84 Corns and callosities: Secondary | ICD-10-CM

## 2018-11-21 DIAGNOSIS — M79674 Pain in right toe(s): Secondary | ICD-10-CM | POA: Diagnosis not present

## 2018-11-21 DIAGNOSIS — G609 Hereditary and idiopathic neuropathy, unspecified: Secondary | ICD-10-CM | POA: Diagnosis not present

## 2018-11-21 DIAGNOSIS — B351 Tinea unguium: Secondary | ICD-10-CM | POA: Diagnosis not present

## 2018-11-21 DIAGNOSIS — M79675 Pain in left toe(s): Secondary | ICD-10-CM

## 2018-11-21 NOTE — Patient Instructions (Signed)

## 2018-11-21 NOTE — Progress Notes (Signed)
Subjective: [name] presents with neuropathy and cc of painful, discolored, thick toenails and painful callus plantar right foot which interfere with activities of daily living. Pain is aggravated when wearing enclosed shoe gear. Pain is relieved with periodic professional debridement.  Patient's daughter states after [name] modified [name] insole, the moleskin came out the same day.   Paula Grippe, MD is her PCP.   Current Outpatient Medications:  .  Acetaminophen (TYLENOL) 325 MG CAPS, Take 325 mg by mouth 3 (three) times daily as needed., Disp: , Rfl:  .  amoxicillin (AMOXIL) 500 MG capsule, TAKE 4 CAPSULES BY MOUTH 1 HOUR PRIOR TO APPOINTMENT, Disp: , Rfl: 0 .  amoxicillin (AMOXIL) 500 MG capsule, amoxicillin 500 mg capsule  TAKE 4 CAPSULES 1 HOUR BEFORE PROCEDURE, Disp: , Rfl:  .  aspirin 81 MG chewable tablet, Chew 81 mg by mouth daily., Disp: , Rfl:  .  atorvastatin (LIPITOR) 80 MG tablet, Take 1 tablet (80 mg total) by mouth daily at 6 PM., Disp: 60 tablet, Rfl: 0 .  bimatoprost (LUMIGAN) 0.01 % SOLN, Place 1 drop into the right eye at bedtime., Disp: , Rfl:  .  brimonidine-timolol (COMBIGAN) 0.2-0.5 % ophthalmic solution, Place 1 drop into both eyes every 12 (twelve) hours., Disp: , Rfl:  .  Calcium Carbonate-Vitamin D (CALTRATE 600+D PO), Take 1 tablet by mouth 2 (two) times daily., Disp: , Rfl:  .  chlorpheniramine (CHLOR-TRIMETON) 4 MG tablet, Take 4 mg by mouth every 4 (four) hours as needed for allergies., Disp: , Rfl:  .  dorzolamide-timolol (COSOPT) 22.3-6.8 MG/ML ophthalmic solution, dorzolamide 22.3 mg-timolol 6.8 mg/mL eye drops, Disp: , Rfl:  .  famotidine (PEPCID) 20 MG tablet, Take 20 mg by mouth daily., Disp: , Rfl:  .  furosemide (LASIX) 20 MG tablet, furosemide 20 mg tablet  TAKE 1 TABLET BY MOUTH EVERY DAY, Disp: , Rfl:  .  latanoprost (XALATAN) 0.005 % ophthalmic solution, latanoprost 0.005 % eye drops  INSTILL 1 DROP INTO RIGHT EYE AT BEDTIME, Disp: , Rfl:  .   losartan (COZAAR) 50 MG tablet, Take 1 tablet (50 mg total) by mouth daily., Disp: 60 tablet, Rfl: 0 .  metoprolol (LOPRESSOR) 50 MG tablet, Take 25 mg by mouth 2 (two) times daily., Disp: , Rfl:  .  Multiple Vitamins-Minerals (MULTIPLE VITAMINS/WOMENS PO), Take 1 tablet by mouth daily. W/vitamin D, Disp: , Rfl:  .  Netarsudil Dimesylate (RHOPRESSA) 0.02 % SOLN, Rhopressa 0.02 % eye drops  INSTILL 1 DROP INTO AFFECTED EYE(S) BY OPHTHALMIC ROUTE ONCE DAILY INTHE EVENING, Disp: , Rfl:  .  ofloxacin (OCUFLOX) 0.3 % ophthalmic solution, INSTILL 1 DROP INTO RIGHT EYE FOUR TIMES A DAY STARTING 1 DAY BEFORE SURGERY, Disp: , Rfl: 1 .  pantoprazole (PROTONIX) 40 MG tablet, Take 1 tablet (40 mg total) by mouth daily. Take 30-60 min before first meal of the day, Disp: 30 tablet, Rfl: 2 .  Polyethyl Glycol-Propyl Glycol (SYSTANE OP), Apply 1 drop to eye 2 (two) times daily., Disp: , Rfl:  .  prednisoLONE acetate (PRED FORTE) 1 % ophthalmic suspension, INSTILL 1 DROP INTO RIGHT EYE FOUR TIMES A DAY AFTER SURGERY, Disp: , Rfl: 1 .  triamcinolone cream (KENALOG) 0.1 %, Apply 1 application topically daily as needed. For rash, Disp: 30 g, Rfl: 1  Allergies  Allergen Reactions  . Meloxicam Hives  . Other    Objective: 85 year old Caucasian female well-developed well-nourished in no acute distress.  Alert, awake and oriented x3.  Vascular  Examination: Capillary refill time immediate x 10 digits  Dorsalis pedis and Posterior tibial pulses  faintly palpable b/l  Digital hair absent x 10 digits  Skin temperature gradient WNL b/l  Dermatological Examination: Skin thin and atrophied b/l LE  Toenails 1-5 b/l discolored, thick, dystrophic with subungual debris and pain with palpation to nailbeds due to thickness of nails.  Preulcerative callus noted submetatarsal head 2nd right foot with subdermal hemorrhage. No edema, no erythema, no drainage, no flocculence, no warmth.  Musculoskeletal: Muscle  strength 5/5 to all LE muscle groups  HAV deformity right foot  Hammertoe 2nd digit right foot  No pain, crepitus or joint limitation noted with ROM.   Neurological: Sensation diminished with 10 gram monofilament.  Assessment: 1. Painful onychomycosis toenails 1-5 b/l  2. Preulcerative callus submetatarsal head 2nd right foot 3. Peripheral neuropathy  Plan: 1. Toenails 1-5 b/l were debrided in length and girth without iatrogenic bleeding. 2. Pre-ulcerative callus submetatarsal head 2 right foot was pared utilizing sterile scalpel blade without incident. 3. We will arrange an appointment with our Pedorthist,  Ria Clock to evaluate and modify her insert again.  This will be at the discretion of her family who brings her in for appointments. 4. Patient to report any pedal injuries to medical professional immediately. 5. Follow up 6 weeks.  6. Patient/POA to call should there be a concern in the interim.

## 2018-11-29 DIAGNOSIS — E538 Deficiency of other specified B group vitamins: Secondary | ICD-10-CM | POA: Diagnosis not present

## 2018-11-30 ENCOUNTER — Ambulatory Visit: Payer: Medicare Other | Admitting: Podiatry

## 2018-12-07 NOTE — Progress Notes (Signed)
HPI: Follow-up coronary artery disease. Carotid Dopplers October 2016 showed no significant stenosis. Admitted November 2016 and ruled in for a non-ST elevation myocardial infarction. Cardiac catheterization revealed a 95% distal RCA. Patient had a drug-eluting stent. Procedure complicated by CVA. Echocardiogram November 2016 showed normal LV function, grade 1 diastolic dysfunction, mild mitral regurgitation, moderate tricuspid regurgitation and moderate to severe pulmonary hypertension.  Patient seen in the emergency room January 2020 with left lower extremity edema.  Lower extremity ultrasound negative for DVT.  Since last seen,  she denies dyspnea, chest pain, palpitations or syncope.  Mild pedal edema.  Worse on the left.  Current Outpatient Medications  Medication Sig Dispense Refill  . Acetaminophen (TYLENOL) 325 MG CAPS Take 325 mg by mouth 3 (three) times daily as needed.    Marland Kitchen amoxicillin (AMOXIL) 500 MG capsule TAKE 4 CAPSULES BY MOUTH 1 HOUR PRIOR TO APPOINTMENT  0  . aspirin 81 MG chewable tablet Chew 81 mg by mouth daily.    Marland Kitchen atorvastatin (LIPITOR) 80 MG tablet Take 1 tablet (80 mg total) by mouth daily at 6 PM. 60 tablet 0  . bimatoprost (LUMIGAN) 0.01 % SOLN Place 1 drop into the right eye at bedtime.    . Calcium Carbonate-Vitamin D (CALTRATE 600+D PO) Take 1 tablet by mouth 2 (two) times daily.    . chlorpheniramine (CHLOR-TRIMETON) 4 MG tablet Take 4 mg by mouth every 4 (four) hours as needed for allergies.    Marland Kitchen dorzolamide-timolol (COSOPT) 22.3-6.8 MG/ML ophthalmic solution Place 1 drop into both eyes 2 (two) times daily.     . famotidine (PEPCID) 20 MG tablet Take 20 mg by mouth daily.    . furosemide (LASIX) 20 MG tablet furosemide 20 mg tablet  TAKE 1 TABLET BY MOUTH EVERY DAY    . latanoprost (XALATAN) 0.005 % ophthalmic solution latanoprost 0.005 % eye drops  INSTILL 1 DROP INTO RIGHT EYE AT BEDTIME    . losartan (COZAAR) 25 MG tablet Take 50 mg by mouth daily.      . metoprolol (LOPRESSOR) 50 MG tablet Take 25 mg by mouth 2 (two) times daily.    . Multiple Vitamins-Minerals (MULTIPLE VITAMINS/WOMENS PO) Take 1 tablet by mouth daily. W/vitamin D    . Netarsudil Dimesylate (RHOPRESSA) 0.02 % SOLN Rhopressa 0.02 % eye drops  INSTILL 1 DROP INTO AFFECTED EYE(S) BY OPHTHALMIC ROUTE ONCE DAILY INTHE EVENING    . pantoprazole (PROTONIX) 40 MG tablet Take 1 tablet (40 mg total) by mouth daily. Take 30-60 min before first meal of the day 30 tablet 2  . Polyethyl Glycol-Propyl Glycol (SYSTANE OP) Apply 1 drop to eye 2 (two) times daily.    Marland Kitchen triamcinolone cream (KENALOG) 0.1 % Apply 1 application topically daily as needed. For rash 30 g 1   No current facility-administered medications for this visit.      Past Medical History:  Diagnosis Date  . Anemia   . Arthritis   . Gout, joint   . Hypertension   . NSTEMI (non-ST elevated myocardial infarction) (HCC) 08/2015  . Stroke [facility])     Past Surgical History:  Procedure Laterality Date  . ABDOMINAL HYSTERECTOMY    . BREAST SURGERY     CYST  . CARDIAC CATHETERIZATION N/A 08/18/2015   Procedure: Left Heart Cath and Coronary Angiography;  Surgeon: Lennette Bihari, MD;  Location: [facility] INVASIVE CV LAB;  Service: Cardiovascular;  Laterality: N/A;  . CARDIAC CATHETERIZATION  08/18/2015   Procedure:  Coronary Stent Intervention;  Surgeon: Lennette Bihari, MD;  Location: [facility] INVASIVE CV LAB;  Service: Cardiovascular;;  . DILATION AND CURETTAGE OF UTERUS    . EYE SURGERY  both  . HERNIA REPAIR    . JOINT REPLACEMENT  left knee  . REFRACTIVE SURGERY Right 2017    Social History   Socioeconomic History  . Marital status: Divorced    Spouse name: Not on file  . Number of children: 3  . Years of education: 30  . Highest education level: Not on file  Occupational History    Comment: retired  Engineer, production  . Financial resource strain: Not on file  . Food insecurity:    Worry: Not on file    Inability: Not on  file  . Transportation needs:    Medical: Not on file    Non-medical: Not on file  Tobacco Use  . Smoking status: Never Smoker  . Smokeless tobacco: Never Used  Substance and Sexual Activity  . Alcohol use: No    Alcohol/week: 0.0 standard drinks  . Drug use: No  . Sexual activity: Never    Birth control/protection: Post-menopausal  Lifestyle  . Physical activity:    Days per week: Not on file    Minutes per session: Not on file  . Stress: Not on file  Relationships  . Social connections:    Talks on phone: Not on file    Gets together: Not on file    Attends religious service: Not on file    Active member of club or organization: Not on file    Attends meetings of clubs or organizations: Not on file    Relationship status: Not on file  . Intimate partner violence:    Fear of current or ex partner: Not on file    Emotionally abused: Not on file    Physically abused: Not on file    Forced sexual activity: Not on file  Other Topics Concern  . Not on file  Social History Narrative   Patient is single and lives at home alone.   Retired   Education 12th grade    Right handed   Caffeine  sometimes    Family History  Problem Relation Age of Onset  . Heart disease Mother   . Stroke Mother     ROS: no fevers or chills, productive cough, hemoptysis, dysphasia, odynophagia, melena, hematochezia, dysuria, hematuria, rash, seizure activity, orthopnea, PND, pedal edema, claudication. Remaining systems are negative.  Physical Exam: Well-developed well-nourished in no acute distress.  Skin is warm and dry.  HEENT is normal.  Neck is supple.  Chest is clear to auscultation with normal expansion.  Cardiovascular exam is regular rate and rhythm. 2/6 systolic murmur Abdominal exam nontender or distended. No masses palpated. Extremities show no edema. neuro grossly intact  A/P  1 coronary artery disease-patient denies chest pain.  Continue medical therapy with aspirin and  statin.  Conservative measures given patient's age.  2 hypertension-patient's blood pressure is controlled today.  Continue present medications and follow.  3 hyperlipidemia-continue statin.  Lipids and liver monitored by primary care.  4 lower extremity edema-continue low-dose diuretic.  Recent venous Doppler negative.  5 possible aortic stenosis (murmur on exam; does not sound severe)-I discussed further evaluation with patient and daughter.  Given her age I would like to be conservative and she is not having symptoms.  She is also not clear she would ever consider TAVR.  We have elected not to pursue  follow-up echocardiogram at this point.  Olga Millers, MD

## 2018-12-12 ENCOUNTER — Encounter: Payer: Self-pay | Admitting: Cardiology

## 2018-12-12 ENCOUNTER — Ambulatory Visit: Payer: Medicare Other | Admitting: Cardiology

## 2018-12-12 VITALS — BP 127/60 | HR 63 | Ht 60.0 in | Wt 151.0 lb

## 2018-12-12 DIAGNOSIS — I10 Essential (primary) hypertension: Secondary | ICD-10-CM

## 2018-12-12 DIAGNOSIS — I2510 Atherosclerotic heart disease of native coronary artery without angina pectoris: Secondary | ICD-10-CM

## 2018-12-12 DIAGNOSIS — E7800 Pure hypercholesterolemia, unspecified: Secondary | ICD-10-CM

## 2018-12-12 NOTE — Patient Instructions (Signed)

## 2019-01-24 ENCOUNTER — Telehealth: Payer: Self-pay | Admitting: Podiatry

## 2019-01-24 NOTE — Telephone Encounter (Signed)
Patient daughter called and stated her mom has a callus and doesn't want to bring her out right now. Patients daughter wanted to know what can she do in the meantime.

## 2019-01-24 NOTE — Telephone Encounter (Signed)
I informed Ms [name] she could pad off the calloused areas with non-medicated callous pads, soft bottom shoe, and lotion to the skin at night for lubication but not on or between the toes, and to call with concerns, our office was seeing urgent care pts and pain was an urgent problem. I informed Ms [name] our office was following all CDC restriction guidelines during the pandemic.

## 2019-01-29 ENCOUNTER — Other Ambulatory Visit: Payer: Medicare Other | Admitting: Orthotics

## 2019-01-29 ENCOUNTER — Other Ambulatory Visit: Payer: Self-pay

## 2019-01-29 ENCOUNTER — Ambulatory Visit: Payer: Medicare Other | Admitting: Podiatry

## 2019-01-29 ENCOUNTER — Encounter: Payer: Self-pay | Admitting: Podiatry

## 2019-01-29 VITALS — Temp 96.8°F

## 2019-01-29 DIAGNOSIS — G609 Hereditary and idiopathic neuropathy, unspecified: Secondary | ICD-10-CM | POA: Diagnosis not present

## 2019-01-29 DIAGNOSIS — M79675 Pain in left toe(s): Secondary | ICD-10-CM

## 2019-01-29 DIAGNOSIS — L84 Corns and callosities: Secondary | ICD-10-CM

## 2019-01-29 DIAGNOSIS — B351 Tinea unguium: Secondary | ICD-10-CM | POA: Diagnosis not present

## 2019-01-29 DIAGNOSIS — I739 Peripheral vascular disease, unspecified: Secondary | ICD-10-CM

## 2019-01-29 DIAGNOSIS — M79674 Pain in right toe(s): Secondary | ICD-10-CM | POA: Diagnosis not present

## 2019-01-29 NOTE — Progress Notes (Signed)
Subjective: Paula LeekMary C Mccrory presents with h/o neuropathy for chronic painful callus plantar aspect of right foot. She also has tender, mycotic toenails which she cannot trim.   Her daughter is present during the visit on today. Daughter wants to hold off on shoes for now.   Pearson GrippeKim, James, MD is her PCP and last visit was    Current Outpatient Medications:  .  Acetaminophen (TYLENOL) 325 MG CAPS, Take 325 mg by mouth 3 (three) times daily as needed., Disp: , Rfl:  .  amoxicillin (AMOXIL) 500 MG capsule, TAKE 4 CAPSULES BY MOUTH 1 HOUR PRIOR TO APPOINTMENT, Disp: , Rfl: 0 .  aspirin 81 MG chewable tablet, Chew 81 mg by mouth daily., Disp: , Rfl:  .  atorvastatin (LIPITOR) 80 MG tablet, Take 1 tablet (80 mg total) by mouth daily at 6 PM., Disp: 60 tablet, Rfl: 0 .  bimatoprost (LUMIGAN) 0.01 % SOLN, Place 1 drop into the right eye at bedtime., Disp: , Rfl:  .  Calcium Carbonate-Vitamin D (CALTRATE 600+D PO), Take 1 tablet by mouth 2 (two) times daily., Disp: , Rfl:  .  chlorpheniramine (CHLOR-TRIMETON) 4 MG tablet, Take 4 mg by mouth every 4 (four) hours as needed for allergies., Disp: , Rfl:  .  dorzolamide-timolol (COSOPT) 22.3-6.8 MG/ML ophthalmic solution, Place 1 drop into both eyes 2 (two) times daily. , Disp: , Rfl:  .  famotidine (PEPCID) 20 MG tablet, Take 20 mg by mouth daily., Disp: , Rfl:  .  furosemide (LASIX) 20 MG tablet, furosemide 20 mg tablet  TAKE 1 TABLET BY MOUTH EVERY DAY, Disp: , Rfl:  .  latanoprost (XALATAN) 0.005 % ophthalmic solution, latanoprost 0.005 % eye drops  INSTILL 1 DROP INTO RIGHT EYE AT BEDTIME, Disp: , Rfl:  .  losartan (COZAAR) 25 MG tablet, Take 50 mg by mouth daily., Disp: , Rfl:  .  metoprolol (LOPRESSOR) 50 MG tablet, Take 25 mg by mouth 2 (two) times daily., Disp: , Rfl:  .  Multiple Vitamins-Minerals (MULTIPLE VITAMINS/WOMENS PO), Take 1 tablet by mouth daily. W/vitamin D, Disp: , Rfl:  .  Netarsudil Dimesylate (RHOPRESSA) 0.02 % SOLN, Rhopressa 0.02 % eye  drops  INSTILL 1 DROP INTO AFFECTED EYE(S) BY OPHTHALMIC ROUTE ONCE DAILY INTHE EVENING, Disp: , Rfl:  .  pantoprazole (PROTONIX) 40 MG tablet, Take 1 tablet (40 mg total) by mouth daily. Take 30-60 min before first meal of the day, Disp: 30 tablet, Rfl: 2 .  Polyethyl Glycol-Propyl Glycol (SYSTANE OP), Apply 1 drop to eye 2 (two) times daily., Disp: , Rfl:  .  triamcinolone cream (KENALOG) 0.1 %, Apply 1 application topically daily as needed. For rash, Disp: 30 g, Rfl: 1  Allergies  Allergen Reactions  . Meloxicam Hives  . Other   . Nitrofuran Derivatives Rash    Vascular Examination: Capillary refill time immediate  x 10 digits.  Dorsalis pedis pulses faintly palpable b/l.  Posterior tibial pulses diminished b/l.  Dgital hair absent x 10 digits.  Skin temperature gradient WNL b/l.  BLE edema with no breaks in skin. No warmth.   Dermatological Examination: Skin thin and atrophied b/l LE.  Toenails 1-5 b/l discolored, thick, dystrophic with subungual debris and pain with palpation to nailbeds due to thickness of nails.  Hyperkeratotic lesion submet head 2, 3, 5 right foot.  No erythema, no edema, no drainage, no flocculence noted.   Musculoskeletal: Muscle strength 5/5 to all LE muscle groups.  HAV deformity right foot.  Hammertoe 2nd  digit right foot.   Neurological: Sensation diminished with 10 gram monofilament.  Assessment: 1. Painful onychomycosis toenails 1-5 b/l 2. Calluses submet head 2, 3, 5 right foot 3. Peripheral neuropathy  Plan: 1. Continue diabetic foot care principles. Literature dispensed on today. 2. Toenails 1-5 b/l were debrided in length and girth without iatrogenic bleeding. 3. Calluses pared submetatarsal head(s) 2, 3, 5 right foot utilizing sterile scalpel blade without incident. Corn(s) pared utilizing sterile scalpel blade without incident.  4. Patient to continue soft, supportive shoe gear 5. Patient to report any pedal injuries to  medical professional  6. Follow up 6 weeks. 7. Patient/POA to call should there be a concern in the interim.

## 2019-02-07 DIAGNOSIS — M1711 Unilateral primary osteoarthritis, right knee: Secondary | ICD-10-CM | POA: Diagnosis not present

## 2019-03-22 DIAGNOSIS — E785 Hyperlipidemia, unspecified: Secondary | ICD-10-CM | POA: Diagnosis not present

## 2019-03-22 DIAGNOSIS — E1122 Type 2 diabetes mellitus with diabetic chronic kidney disease: Secondary | ICD-10-CM | POA: Diagnosis not present

## 2019-03-22 DIAGNOSIS — M810 Age-related osteoporosis without current pathological fracture: Secondary | ICD-10-CM | POA: Diagnosis not present

## 2019-03-22 DIAGNOSIS — N390 Urinary tract infection, site not specified: Secondary | ICD-10-CM | POA: Diagnosis not present

## 2019-03-22 DIAGNOSIS — D81818 Other biotin-dependent carboxylase deficiency: Secondary | ICD-10-CM | POA: Diagnosis not present

## 2019-03-26 DIAGNOSIS — E1122 Type 2 diabetes mellitus with diabetic chronic kidney disease: Secondary | ICD-10-CM | POA: Diagnosis not present

## 2019-04-04 ENCOUNTER — Ambulatory Visit: Payer: Medicare Other | Admitting: Podiatry

## 2019-04-05 ENCOUNTER — Encounter: Payer: Self-pay | Admitting: Podiatry

## 2019-04-05 ENCOUNTER — Ambulatory Visit: Payer: Medicare Other | Admitting: Podiatry

## 2019-04-05 ENCOUNTER — Other Ambulatory Visit: Payer: Self-pay

## 2019-04-05 VITALS — Temp 97.4°F

## 2019-04-05 DIAGNOSIS — L989 Disorder of the skin and subcutaneous tissue, unspecified: Secondary | ICD-10-CM | POA: Diagnosis not present

## 2019-04-05 DIAGNOSIS — L84 Corns and callosities: Secondary | ICD-10-CM

## 2019-04-05 DIAGNOSIS — M79674 Pain in right toe(s): Secondary | ICD-10-CM | POA: Diagnosis not present

## 2019-04-05 DIAGNOSIS — M1990 Unspecified osteoarthritis, unspecified site: Secondary | ICD-10-CM | POA: Diagnosis not present

## 2019-04-05 DIAGNOSIS — G609 Hereditary and idiopathic neuropathy, unspecified: Secondary | ICD-10-CM | POA: Diagnosis not present

## 2019-04-05 DIAGNOSIS — N390 Urinary tract infection, site not specified: Secondary | ICD-10-CM | POA: Diagnosis not present

## 2019-04-05 DIAGNOSIS — B079 Viral wart, unspecified: Secondary | ICD-10-CM | POA: Diagnosis not present

## 2019-04-05 DIAGNOSIS — B351 Tinea unguium: Secondary | ICD-10-CM

## 2019-04-05 DIAGNOSIS — M79675 Pain in left toe(s): Secondary | ICD-10-CM | POA: Diagnosis not present

## 2019-04-05 NOTE — Patient Instructions (Signed)
Corns and Calluses Corns are small areas of thickened skin that occur on the top, sides, or tip of a toe. They contain a cone-shaped core with a point that can press on a nerve below. This causes pain.  Calluses are areas of thickened skin that can occur anywhere on the body, including the hands, fingers, palms, soles of the feet, and heels. Calluses are usually larger than corns. What are the causes? Corns and calluses are caused by rubbing (friction) or pressure, such as from shoes that are too tight or do not fit properly. What increases the risk? Corns are more likely to develop in people who have misshapen toes (toe deformities), such as hammer toes. Calluses can occur with friction to any area of the skin. They are more likely to develop in people who:  Work with their hands.  Wear shoes that fit poorly, are too tight, or are high-heeled.  Have toe deformities. What are the signs or symptoms? Symptoms of a corn or callus include:  A hard growth on the skin.  Pain or tenderness under the skin.  Redness and swelling.  Increased discomfort while wearing tight-fitting shoes, if your feet are affected. If a corn or callus becomes infected, symptoms may include:  Redness and swelling that gets worse.  Pain.  Fluid, blood, or pus draining from the corn or callus. How is this diagnosed? Corns and calluses may be diagnosed based on your symptoms, your medical history, and a physical exam. How is this treated? Treatment for corns and calluses may include:  Removing the cause of the friction or pressure. This may involve: ? Changing your shoes. ? Wearing shoe inserts (orthotics) or other protective layers in your shoes, such as a corn pad. ? Wearing gloves.  Applying medicine to the skin (topical medicine) to help soften skin in the hardened, thickened areas.  Removing layers of dead skin with a file to reduce the size of the corn or callus.  Removing the corn or callus with a  scalpel or laser.  Taking antibiotic medicines, if your corn or callus is infected.  Having surgery, if a toe deformity is the cause. Follow these instructions at home:   Take over-the-counter and prescription medicines only as told by your health care provider.  If you were prescribed an antibiotic, take it as told by your health care provider. Do not stop taking it even if your condition starts to improve.  Wear shoes that fit well. Avoid wearing high-heeled shoes and shoes that are too tight or too loose.  Wear any padding, protective layers, gloves, or orthotics as told by your health care provider.  Soak your hands or feet and then use a file or pumice stone to soften your corn or callus. Do this as told by your health care provider.  Check your corn or callus every day for symptoms of infection. Contact a health care provider if you:  Notice that your symptoms do not improve with treatment.  Have redness or swelling that gets worse.  Notice that your corn or callus becomes painful.  Have fluid, blood, or pus coming from your corn or callus.  Have new symptoms. Summary  Corns are small areas of thickened skin that occur on the top, sides, or tip of a toe.  Calluses are areas of thickened skin that can occur anywhere on the body, including the hands, fingers, palms, and soles of the feet. Calluses are usually larger than corns.  Corns and calluses are caused by   rubbing (friction) or pressure, such as from shoes that are too tight or do not fit properly.  Treatment may include wearing any padding, protective layers, gloves, or orthotics as told by your health care provider. This information is not intended to replace advice given to you by your health care provider. Make sure you discuss any questions you have with your health care provider. Document Released: 07/02/2004 Document Revised: 08/09/2017 Document Reviewed: 08/09/2017 Elsevier Interactive Patient Education   2019 Else[employer] Inc.  Onychomycosis/Fungal Toenails  WHAT IS IT? An infection that lies within the keratin of your nail plate that is caused by a fungus.  WHY ME? Fungal infections affect all ages, sexes, races, and creeds.  There may be many factors that predispose you to a fungal infection such as age, coexisting medical conditions such as diabetes, or an autoimmune disease; stress, medications, fatigue, genetics, etc.  Bottom line: fungus thrives in a warm, moist environment and your shoes offer such a location.  IS IT CONTAGIOUS? Theoretically, yes.  You do not want to share shoes, nail clippers or files with someone who has fungal toenails.  Walking around barefoot in the same room or sleeping in the same bed is unlikely to transfer the organism.  It is important to realize, however, that fungus can spread easily from one nail to the next on the same foot.  HOW DO WE TREAT THIS?  There are several ways to treat this condition.  Treatment may depend on many factors such as age, medications, pregnancy, liver and kidney conditions, etc.  It is best to ask your doctor which options are available to you.  1. No treatment.   Unlike many other medical concerns, you can live with this condition.  However for many people this can be a painful condition and may lead to ingrown toenails or a bacterial infection.  It is recommended that you keep the nails cut short to help reduce the amount of fungal nail. 2. Topical treatment.  These range from herbal remedies to prescription strength nail lacquers.  About 40-50% effective, topicals require twice daily application for approximately 9 to 12 months or until an entirely new nail has grown out.  The most effective topicals are medical grade medications available through physicians offices. 3. Oral antifungal medications.  With an 80-90% cure rate, the most common oral medication requires 3 to 4 months of therapy and stays in your system for a year as the new nail  grows out.  Oral antifungal medications do require blood work to make sure it is a safe drug for you.  A liver function panel will be performed prior to starting the medication and after the first month of treatment.  It is important to have the blood work performed to avoid any harmful side effects.  In general, this medication safe but blood work is required. 4. Laser Therapy.  This treatment is performed by applying a specialized laser to the affected nail plate.  This therapy is noninvasive, fast, and non-painful.  It is not covered by insurance and is therefore, out of pocket.  The results have been very good with a 80-95% cure rate.  The [facility] is the only practice in the area to offer this therapy. 5. Permanent Nail Avulsion.  Removing the entire nail so that a new nail will not grow back. 

## 2019-04-13 NOTE — Progress Notes (Signed)
Subjective: Paula LeekMary C [name] presents today with history of neuropathy with cc of painful, mycotic toenails and plantar calluses.  Pain is aggravated when wearing enclosed shoe gear and relieved with periodic professional debridement.  Past Medical History:  Diagnosis Date  . Anemia   . Arthritis   . Gout, joint   . Hypertension   . NSTEMI (non-ST elevated myocardial infarction) (HCC) 08/2015  . Stroke [facility](HCC)     Patient Active Problem List   Diagnosis Date Noted  . Pain in joint of left shoulder 05/03/2018  . Osteoarthritis of knee 12/28/2017  . Upper airway cough syndrome 07/24/2017  . Cardioembolic stroke (HCC) 11/13/2015  . Coronary artery disease 11/12/2015  . Hyperlipidemia 11/12/2015  . TIA (transient ischemic attack)   . 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 NSTEMI (non-ST elevated myocardial infarction) (HCC) 08/18/2015  . History of stroke 07/15/2015  . Acute, but ill-defined, cerebrovascular disease (HCC) 07/15/2015  . CVA (cerebral infarction) 09/26/2012  . 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 Fall at home 09/26/2012  . UTI (lower urinary tract infection) 09/26/2012  . Hyperkalemia 09/26/2012  . CKD (chronic kidney disease), stage III (HCC) 09/26/2012  . Essential hypertension     Past Surgical History:  Procedure Laterality Date  . ABDOMINAL HYSTERECTOMY    . BREAST SURGERY     CYST  . CARDIAC CATHETERIZATION N/A 08/18/2015   Procedure: Left Heart Cath and Coronary Angiography;  Surgeon: Lennette Biharihomas A Kelly, MD;  [facility];  Service: Cardiovascular;  Laterality: N/A;  . CARDIAC CATHETERIZATION  08/18/2015   Procedure: Coronary Stent Intervention;  Surgeon: Lennette Biharihomas A Kelly, MD;  Location: [facility] INVASIVE CV LAB;  Service: Cardiovascular;;  . DILATION AND CURETTAGE OF UTERUS    . EYE SURGERY  both  . HERNIA REPAIR    . JOINT REPLACEMENT  left knee  . REFRACTIVE SURGERY Right 2017     Current  Outpatient Medications:  .  Acetaminophen (TYLENOL) 325 MG CAPS, Take 325 mg by mouth 3 (three) times daily as needed., Disp: , Rfl:  .  amoxicillin (AMOXIL) 500 MG capsule, TAKE 4 CAPSULES BY MOUTH 1 HOUR PRIOR TO APPOINTMENT, Disp: , Rfl: 0 .  aspirin 81 MG chewable tablet, Chew 81 mg by mouth daily., Disp: , Rfl:  .  atorvastatin (LIPITOR) 80 MG tablet, Take 1 tablet (80 mg total) by mouth daily at 6 PM., Disp: 60 tablet, Rfl: 0 .  bimatoprost (LUMIGAN) 0.01 % SOLN, Place 1 drop into the right eye at bedtime., Disp: , Rfl:  .  brimonidine (ALPHAGAN) 0.2 % ophthalmic solution, brimonidine 0.2 % eye drops  INSTILL 1 DROP INTO BOTH EYES TWICE A DAY, Disp: , Rfl:  .  Calcium Carbonate-Vitamin D (CALTRATE 600+D PO), Take 1 tablet by mouth 2 (two) times daily., Disp: , Rfl:  .  cefUROXime (CEFTIN) 500 MG tablet, TAKE 1 TABLET BY MOUTH EVERY 12 HOURS FOR 7 DAYS, Disp: , Rfl:  .  chlorpheniramine (CHLOR-TRIMETON) 4 MG tablet, Take 4 mg by mouth every 4 (four) hours as needed for allergies., Disp: , Rfl:  .  dorzolamide-timolol (COSOPT) 22.3-6.8 MG/ML ophthalmic solution, Place 1 drop into both eyes 2 (two) times daily. , Disp: , Rfl:  .  famotidine (PEPCID) 20 MG tablet, Take 20 mg by mouth daily., Disp: , Rfl:  .  furosemide (LASIX) 20 MG tablet, furosemide 20 mg tablet  TAKE 1 TABLET BY MOUTH EVERY DAY, Disp: ,  Rfl:  .  latanoprost (XALATAN) 0.005 % ophthalmic solution, latanoprost 0.005 % eye drops  INSTILL 1 DROP INTO RIGHT EYE AT BEDTIME, Disp: , Rfl:  .  losartan (COZAAR) 25 MG tablet, Take 50 mg by mouth daily., Disp: , Rfl:  .  metoprolol (LOPRESSOR) 50 MG tablet, Take 25 mg by mouth 2 (two) times daily., Disp: , Rfl:  .  Multiple Vitamins-Minerals (MULTIPLE VITAMINS/WOMENS PO), Take 1 tablet by mouth daily. W/vitamin D, Disp: , Rfl:  .  Netarsudil Dimesylate (RHOPRESSA) 0.02 % SOLN, Rhopressa 0.02 % eye drops  INSTILL 1 DROP INTO AFFECTED EYE(S) BY OPHTHALMIC ROUTE ONCE DAILY INTHE EVENING,  Disp: , Rfl:  .  pantoprazole (PROTONIX) 40 MG tablet, Take 1 tablet (40 mg total) by mouth daily. Take 30-60 min before first meal of the day, Disp: 30 tablet, Rfl: 2 .  Polyethyl Glycol-Propyl Glycol (SYSTANE OP), Apply 1 drop to eye 2 (two) times daily., Disp: , Rfl:  .  triamcinolone cream (KENALOG) 0.1 %, Apply 1 application topically daily as needed. For rash, Disp: 30 g, Rfl: 1  Allergies  Allergen Reactions  . Meloxicam Hives  . Other   . Nitrofuran Derivatives Rash    Social History   Occupational History    Comment: retired  Tobacco Use  . Smoking status: Never Smoker  . Smokeless tobacco: Never Used  Substance and Sexual Activity  . Alcohol use: No    Alcohol/week: 0.0 standard drinks  . Drug use: No  . Sexual activity: Never    Birth control/protection: Post-menopausal    Family History  Problem Relation Age of Onset  . Heart disease Mother   . Stroke Mother     Immunization History  Administered Date(s) Administered  . Influenza Whole 06/10/2017  . Influenza-Unspecified 07/26/2017  . Pneumococcal Polysaccharide-23 09/28/2012   Objective: Vitals:   04/05/19 0843  Temp: (!) 97.4 F (36.3 C)    Vascular Examination: Capillary refill time immediate x 10 digits.  Dorsalis pedis pulses palpable b/l.  Posterior tibial pulses diminished b/l.  Digital hair absent x 10 digits   Skin temperature gradient WNL b/l.  Edema BLE.  Dermatological Examination: Skin thin and atrophied b/l.  Toenails 1-5 b/l discolored, thick, dystrophic with subungual debris and pain with palpation to nailbeds due to thickness of nails.  Hyperkeratotic lesion submet head 2, 3, 5 right foot and distal tip right 4th digit with tenderness to palpation. No edema, no erythema, no drainage, no flocculence.  Musculoskeletal: Muscle strength 5/5 to all muscle groups b/l.  HAV deformity right foot.  Hammertoe 2nd digit right foot.  Neurological: Sensation diminished with 10  gram monofilament.  Assessment: 1. Painful onychomycosis toenails 1-5 b/l 2. Calluses submet head 2, 3, 5 right foot 3. Corn right 4th digit 4. Peripheral neuropathy  Plan: 1. Toenails 1-5 b/l were debrided in length and girth without iatrogenic bleeding. 2. Calluses pared submetatarsal head(s) 2, 3, 5 right foot utilizing sterile scalpel blade without incident.Corn(s) pared right 4th digit utilizing sterile scalpel blade without incident. 3. Patient to continue soft, supportive shoe gear daily. 4. Patient to report any pedal injuries to medical professional immediately. 5. Follow up 9 weeks.  6. Patient/POA to call should there be a concern in the interim.

## 2019-05-16 DIAGNOSIS — M1711 Unilateral primary osteoarthritis, right knee: Secondary | ICD-10-CM | POA: Diagnosis not present

## 2019-05-17 DIAGNOSIS — R350 Frequency of micturition: Secondary | ICD-10-CM | POA: Diagnosis not present

## 2019-05-17 DIAGNOSIS — R8271 Bacteriuria: Secondary | ICD-10-CM | POA: Diagnosis not present

## 2019-06-10 ENCOUNTER — Ambulatory Visit (INDEPENDENT_AMBULATORY_CARE_PROVIDER_SITE_OTHER): Payer: Medicare Other

## 2019-06-10 ENCOUNTER — Other Ambulatory Visit: Payer: Self-pay

## 2019-06-10 ENCOUNTER — Ambulatory Visit (INDEPENDENT_AMBULATORY_CARE_PROVIDER_SITE_OTHER): Payer: Medicare Other | Admitting: Podiatry

## 2019-06-10 ENCOUNTER — Encounter: Payer: Self-pay | Admitting: Podiatry

## 2019-06-10 DIAGNOSIS — G609 Hereditary and idiopathic neuropathy, unspecified: Secondary | ICD-10-CM | POA: Diagnosis not present

## 2019-06-10 DIAGNOSIS — L84 Corns and callosities: Secondary | ICD-10-CM

## 2019-06-10 DIAGNOSIS — L97511 Non-pressure chronic ulcer of other part of right foot limited to breakdown of skin: Secondary | ICD-10-CM | POA: Diagnosis not present

## 2019-06-10 DIAGNOSIS — B351 Tinea unguium: Secondary | ICD-10-CM | POA: Diagnosis not present

## 2019-06-10 DIAGNOSIS — M79675 Pain in left toe(s): Secondary | ICD-10-CM

## 2019-06-10 DIAGNOSIS — M79674 Pain in right toe(s): Secondary | ICD-10-CM | POA: Diagnosis not present

## 2019-06-10 MED ORDER — CEPHALEXIN 500 MG PO CAPS: 500.0000 mg | ORAL_CAPSULE | Freq: Three times a day (TID) | ORAL | 0 refills | Status: DC

## 2019-06-10 NOTE — Patient Instructions (Addendum)
  DRESSING CHANGES RIGHT FOOT:  WEAR SURGICAL SHOE AT ALL TIMES    1. KEEP RIGHT  FOOT DRY AT ALL TIMES!!!!  2. CLEANSE ULCER WITH SALINE.  3. DAB DRY WITH GAUZE SPONGE.  4. APPLY A LIGHT AMOUNT OF IODOSORB TO BASE OF ULCER.  5. APPLY OUTER DRESSING/BAND-AID AS INSTRUCTED.  6. WEAR SURGICAL SHOE DAILY AT ALL TIMES.  7. DO NOT WALK BAREFOOT!!!  8.  IF YOU EXPERIENCE ANY FEVER, CHILLS, NIGHTSWEATS, NAUSEA OR VOMITING, ELEVATED OR LOW BLOOD SUGARS, REPORT TO EMERGENCY ROOM.  9. IF YOU EXPERIENCE INCREASED REDNESS, PAIN, SWELLING, DISCOLORATION, ODOR, PUS, DRAINAGE OR WARMTH OF YOUR FOOT, REPORT TO EMERGENCY ROOM.Diabetes Mellitus and    Foot Care Foot care is an important part of your health, especially when you have diabetes. Diabetes may cause you to have problems because of poor blood flow (circulation) to your feet and legs, which can cause your skin to:  Become thinner and drier.  Break more easily.  Heal more slowly.  Peel and crack. You may also have nerve damage (neuropathy) in your legs and feet, causing decreased feeling in them. This means that you may not notice minor injuries to your feet that could lead to more serious problems. Noticing and addressing any potential problems early is the best way to prevent future foot problems. How to care for your feet Foot hygiene  Wash your feet daily with warm water and mild soap. Do not use hot water. Then, pat your feet and the areas between your toes until they are completely dry. Do not soak your feet as this can dry your skin.  Trim your toenails straight across. Do not dig under them or around the cuticle. File the edges of your nails with an emery board or nail file.  Apply a moisturizing lotion or petroleum jelly to the skin on your feet and to dry, brittle toenails. Use lotion that does not contain alcohol and is unscented. Do not apply lotion between your toes. Shoes and socks  Wear clean socks or stockings  every day. Make sure they are not too tight. Do not wear knee-high stockings since they may decrease blood flow to your legs.  Wear shoes that fit properly and have enough cushioning. Always look in your shoes before you put them on to be sure there are no objects inside.  To break in new shoes, wear them for just a few hours a day. This prevents injuries on your feet. Wounds, scrapes, corns, and calluses  Check your feet daily for blisters, cuts, bruises, sores, and redness. If you cannot see the bottom of your feet, use a mirror or ask someone for help.  Do not cut corns or calluses or try to remove them with medicine.  If you find a minor scrape, cut, or break in the skin on your feet, keep it and the skin around it clean and dry. You may clean these areas with mild soap and water. Do not clean the area with peroxide, alcohol, or iodine.  If you have a wound, scrape, corn, or callus on your foot, look at it several times a day to make sure it is healing and not infected. Check for: ? Redness, swelling, or pain. ? Fluid or blood. ? Warmth. ? Pus or a bad smell. General instructions  Do not cross your legs. This may decrease blood flow to your feet.  Do not use heating pads or hot water bottles on your feet. They may burn   your skin. If you have lost feeling in your feet or legs, you may not know this is happening until it is too late.  Protect your feet from hot and cold by wearing shoes, such as at the beach or on hot pavement.  Schedule a complete foot exam at least once a year (annually) or more often if you have foot problems. If you have foot problems, report any cuts, sores, or bruises to your health care provider immediately. Contact a health care provider if:  You have a medical condition that increases your risk of infection and you have any cuts, sores, or bruises on your feet.  You have an injury that is not healing.  You have redness on your legs or feet.  You feel  burning or tingling in your legs or feet.  You have pain or cramps in your legs and feet.  Your legs or feet are numb.  Your feet always feel cold.  You have pain around a toenail. Get help right away if:  You have a wound, scrape, corn, or callus on your foot and: ? You have pain, swelling, or redness that gets worse. ? You have fluid or blood coming from the wound, scrape, corn, or callus. ? Your wound, scrape, corn, or callus feels warm to the touch. ? You have pus or a bad smell coming from the wound, scrape, corn, or callus. ? You have a fever. ? You have a red line going up your leg. Summary  Check your feet every day for cuts, sores, red spots, swelling, and blisters.  Moisturize feet and legs daily.  Wear shoes that fit properly and have enough cushioning.  If you have foot problems, report any cuts, sores, or bruises to your health care provider immediately.  Schedule a complete foot exam at least once a year (annually) or more often if you have foot problems. This information is not intended to replace advice given to you by your health care provider. Make sure you discuss any questions you have with your health care provider. Document Released: 09/23/2000 Document Revised: 11/08/2017 Document Reviewed: 10/28/2016 Elsevier Patient Education  2020 Elsevier Inc.  

## 2019-06-10 NOTE — Progress Notes (Signed)
Subjective: Patient presents today with diabetes and cc of painful, discolored, thick toenails which interfere with daily activities. Pain is aggravated when wearing enclosed shoe gear. Pain is getting progressively worse and relieved with periodic professional debridement.  Daughter is present during the visit. Daughter states Mom started complaining of painful right foot about one week ago. Pain became so bad, she started wearing her surgical shoe. Daughter states Mom saw drainage on her sock and it had an odor. She has been getting the foot wet. She denies any fever, chills, nightsweats, nausea or vomiting.   Jani Gravel, MD is her PCP.    Allergies  Allergen Reactions  . Meloxicam Hives  . Other   . Nitrofuran Derivatives Rash     Objective:  Vascular Examination: Capillary refill time immediate x 10 digits.  Dorsalis pedis pulses palpable b/l.  Posterior tibial pulses palpable b/l..  Digital hair absent x 10 digits.  Skin temperature gradient WNL b/l.   Dermatological Examination: Skin thin and atrophic b/l.  Toenails 1-5 b/l discolored, thick, dystrophic with subungual debris and pain with palpation to nailbeds due to thickness of nails.  Ulceration located submet head 3 right foot.  Predebridement measurements carried out today of 2.0 x 1.5 cm with hyperkeratotic roof.  There is subdermal hemorrhage visible.There is no perilesion erythema nor edema. No  lymphangitis noted.  No odor.       Postdebridement measurements today are: 1.5 x 1.0 x 0.2 cm to level of subcutaneous tissue. No probing to bone, no undermining, no active pus or purulence noted.  No deep abscess. No odor.  Musculoskeletal: Muscle strength 5/5 to all LE muscle groups.  HAV with bunion deformity right foot.  Hammertoe 2nd digit right foot  Neurological: Sensation diminished b/l with 10 gram monofilament.  Xrays right foot:  +Osteopenia throughout right foot Negative for fracture Negative for  gas in tissue +HAV with bunion +Rigid hammertoe deformity right 2nd digit  Assessment: 1. Painful onychomycosis toenails 1-5 b/l 2. Neurotrophic ulceration right submet head  3. Peripheral neuropathy  Plan: 1. Toenails 1-5 b/l were debrided in length and girth without iatrogenic bleeding. 2. Ulcer was debrided.  Reactive hyperkeratoses and necrotic tissue was resected to the level of bleeding or viable tissue. Ulcer was cleansed with wound cleanser. Iodosorb Gel was applied to base of wound with light dressing.  3. Prescription sent to pharmacy for Keflex 500 mg. Take 1 capsule (500 mg total) by mouth 3 times per day for 10 days. 4. Xray was performed and reviewed with Ms. Plouffe and her daughter.   5. She is wearing an offloaded surgical shoe. Continue daily 6. Patient's daughter was given written dressing change instructions. She was instructed to call immediately if any signs or symptoms of infection arise.  7. Report to emergency department with worsening appearance of ulcer/toe/foot, increased pain, foul odor, increased redness, swelling, drainage, fever, chills, nightsweats, nausea, vomiting, increased blood sugar.  8. Patient/POA related understanding. 9. Follow up one week with Dr. Jacqualyn Posey for re-evaluation of neurotrophic ulceration submet head 3 right foot. 10. Patient/POA to call should there be a concern in the interim.

## 2019-06-12 DIAGNOSIS — M25552 Pain in left hip: Secondary | ICD-10-CM | POA: Insufficient documentation

## 2019-06-13 DIAGNOSIS — M7062 Trochanteric bursitis, left hip: Secondary | ICD-10-CM | POA: Diagnosis not present

## 2019-06-13 DIAGNOSIS — M25552 Pain in left hip: Secondary | ICD-10-CM | POA: Diagnosis not present

## 2019-06-18 ENCOUNTER — Telehealth: Payer: Self-pay | Admitting: *Deleted

## 2019-06-18 ENCOUNTER — Other Ambulatory Visit: Payer: Self-pay

## 2019-06-18 ENCOUNTER — Encounter: Payer: Self-pay | Admitting: Podiatry

## 2019-06-18 ENCOUNTER — Ambulatory Visit (INDEPENDENT_AMBULATORY_CARE_PROVIDER_SITE_OTHER): Payer: Medicare Other | Admitting: Podiatry

## 2019-06-18 DIAGNOSIS — B351 Tinea unguium: Secondary | ICD-10-CM

## 2019-06-18 DIAGNOSIS — L97511 Non-pressure chronic ulcer of other part of right foot limited to breakdown of skin: Secondary | ICD-10-CM

## 2019-06-18 DIAGNOSIS — H40012 Open angle with borderline findings, low risk, left eye: Secondary | ICD-10-CM | POA: Diagnosis not present

## 2019-06-18 DIAGNOSIS — H34811 Central retinal vein occlusion, right eye, with macular edema: Secondary | ICD-10-CM | POA: Diagnosis not present

## 2019-06-18 DIAGNOSIS — M79675 Pain in left toe(s): Secondary | ICD-10-CM

## 2019-06-18 DIAGNOSIS — Z961 Presence of intraocular lens: Secondary | ICD-10-CM | POA: Diagnosis not present

## 2019-06-18 DIAGNOSIS — I739 Peripheral vascular disease, unspecified: Secondary | ICD-10-CM

## 2019-06-18 DIAGNOSIS — H401113 Primary open-angle glaucoma, right eye, severe stage: Secondary | ICD-10-CM | POA: Diagnosis not present

## 2019-06-18 DIAGNOSIS — H04123 Dry eye syndrome of bilateral lacrimal glands: Secondary | ICD-10-CM | POA: Diagnosis not present

## 2019-06-18 NOTE — Telephone Encounter (Signed)
-----   Message from Trula Slade, DPM sent at 06/18/2019 12:11 PM EDT ----- Can you please order arterial studies for her due to decreased pulses and wound

## 2019-06-18 NOTE — Telephone Encounter (Signed)
Faxed orders to [facility]. 

## 2019-06-19 ENCOUNTER — Other Ambulatory Visit: Payer: Self-pay | Admitting: Podiatry

## 2019-06-19 DIAGNOSIS — I739 Peripheral vascular disease, unspecified: Secondary | ICD-10-CM

## 2019-06-19 DIAGNOSIS — L97511 Non-pressure chronic ulcer of other part of right foot limited to breakdown of skin: Secondary | ICD-10-CM

## 2019-06-20 NOTE — Progress Notes (Signed)
Subjective: 85 year old female presents the office today for follow-up evaluation of a wound on the bottom of her right foot.  She does have a history of having the wound several years ago took a long time to heal.  Reviewed likely about for about a year before open back up.  She followed up with Dr. Elisha Ponder underwent open.  At last appointment she was prescribed Keflex which she took for 10 days.  She is doing the offloading shoe.  She is complaining when applying Iodosorb gel to the area but the daughter is concerned that it is getting 'clogged' in the wound.  Denies any drainage of pus and denies any increased swelling or any redness.  Denies any systemic complaints such as fevers, chills, nausea, vomiting. No acute changes since last appointment, and no other complaints at this time.   Objective: AAO x3, NAD DP/PT pulses palpable bilaterally, CRT less than 3 seconds On the right foot submetatarsal area is a hyperkeratotic lesion with a central ulceration.  After debridement serially measured 0.5 x 0.4 cm.  There is superficial granular wound base.  There is no probing, undermining or tunneling.  There is no surrounding erythema, ascending cellulitis.  No fluctuation.  No probing, undermining or tunneling. No pain with calf compression, swelling, warmth, erythema  Assessment: 85 year old female right foot ulceration- improving  Plan: -All treatment options discussed with the patient including all alternatives, risks, complications.  -Instructed debrided the wound today was 312 with scalpel and complications, healthy tissue. Will switch to using medihoney on the wound daily.  Offloading at all times.  Finish course of antibiotics. Monitor for any clinical signs or symptoms of infection and directed to call the office immediately should any occur or go to the ER. -Given her history of slow healing ulceration I will order arterial studies as it does not appear that formal studies have been preformed.   -Patient encouraged to call the office with any questions, concerns, change in symptoms.   Trula Slade DPM

## 2019-06-21 ENCOUNTER — Other Ambulatory Visit: Payer: Self-pay

## 2019-06-21 ENCOUNTER — Ambulatory Visit (HOSPITAL_COMMUNITY)
Admission: RE | Admit: 2019-06-21 | Discharge: 2019-06-21 | Disposition: A | Discharge status: Home / Self Care | Payer: Medicare Other | Source: Ambulatory Visit | Attending: Cardiology | Admitting: Cardiology

## 2019-06-21 DIAGNOSIS — L97511 Non-pressure chronic ulcer of other part of right foot limited to breakdown of skin: Secondary | ICD-10-CM | POA: Diagnosis not present

## 2019-06-21 DIAGNOSIS — M79674 Pain in right toe(s): Secondary | ICD-10-CM | POA: Insufficient documentation

## 2019-06-21 DIAGNOSIS — B351 Tinea unguium: Secondary | ICD-10-CM | POA: Diagnosis not present

## 2019-06-21 DIAGNOSIS — I739 Peripheral vascular disease, unspecified: Secondary | ICD-10-CM

## 2019-06-21 DIAGNOSIS — M79675 Pain in left toe(s): Secondary | ICD-10-CM | POA: Diagnosis not present

## 2019-06-23 NOTE — Progress Notes (Signed)
[facility] - Clinic Note  06/24/2019     CHIEF COMPLAINT Patient presents for Retina Evaluation   HISTORY OF PRESENT ILLNESS: [name] is a 85 y.o. female who presents to the clinic today for:   HPI    Retina Evaluation    In right eye.  Onset: Unknown.  Duration of months.  Associated Symptoms Distortion.  Context:  distance vision, mid-range vision and near vision.  Treatments tried include eye drops.  Response to treatment was no improvement.  I, the attending physician,  performed the HPI with the patient and updated documentation appropriately.          Comments    85 y/o female pt referred by Dr. Marchelle Gearinghris Groat for eval of RVO OD.  Pt claims she began to notice degradation of her vision OD following a GLC sx in 02/2018, and VA OD has gradually gotten worse over time from there.  No issues with vision OS.  Denies pain, flashes, floaters.  Latanoprost QHS OD Dorzolamide-Timolol BID OU Rhopressa QHS OD       Last edited by Rennis ChrisZamora, Leianna Barga, MD on 06/24/2019  4:52 PM. (History)    pt states she is a pt of Dr. Marchelle Gearinghris Groat, she states he saw something going on behind her eye that he wanted checked out, pt states she thinks her vision has been declining for about 6 months  Referring physician: Sallye LatGroat, Christopher, MD [address] [place] [id number]  HISTORICAL INFORMATION:   Selected notes from the MEDICAL RECORD NUMBER Referred by Dr. Marchelle Gearinghris Groat for concern of RVO OS LEE:  Ocular Hx- PMH-   CURRENT MEDICATIONS: Current Outpatient Medications (Ophthalmic Drugs)  Medication Sig  . dorzolamide-timolol (COSOPT) 22.3-6.8 MG/ML ophthalmic solution Place 1 drop into both eyes 2 (two) times daily.   Marland Kitchen. latanoprost (XALATAN) 0.005 % ophthalmic solution latanoprost 0.005 % eye drops  INSTILL 1 DROP INTO RIGHT EYE AT BEDTIME  . Netarsudil Dimesylate (RHOPRESSA) 0.02 % SOLN Rhopressa 0.02 % eye drops  INSTILL 1 DROP INTO AFFECTED EYE(S) BY  OPHTHALMIC ROUTE ONCE DAILY INTHE EVENING  . bimatoprost (LUMIGAN) 0.01 % SOLN Place 1 drop into the right eye at bedtime.  . brimonidine (ALPHAGAN) 0.2 % ophthalmic solution brimonidine 0.2 % eye drops  INSTILL 1 DROP INTO BOTH EYES TWICE A DAY  . Polyethyl Glycol-Propyl Glycol (SYSTANE OP) Apply 1 drop to eye 2 (two) times daily.   No current facility-administered medications for this visit.  (Ophthalmic Drugs)   Current Outpatient Medications (Other)  Medication Sig  . Acetaminophen (TYLENOL) 325 MG CAPS Take 325 mg by mouth 3 (three) times daily as needed.  Marland Kitchen. aspirin 81 MG chewable tablet Chew 81 mg by mouth daily.  Marland Kitchen. atorvastatin (LIPITOR) 80 MG tablet Take 1 tablet (80 mg total) by mouth daily at 6 PM.  . Calcium Carbonate-Vitamin D (CALTRATE 600+D PO) Take 1 tablet by mouth 2 (two) times daily.  . chlorpheniramine (CHLOR-TRIMETON) 4 MG tablet Take 4 mg by mouth every 4 (four) hours as needed for allergies.  Marland Kitchen. diclofenac sodium (VOLTAREN) 1 % GEL diclofenac 1 % topical gel  AS DIRECTED TWICE A DAY AS NEEDED TRANSDERMAL 14 DAYS  . famotidine (PEPCID) 20 MG tablet Take 20 mg by mouth daily.  . furosemide (LASIX) 20 MG tablet furosemide 20 mg tablet  TAKE 1 TABLET BY MOUTH EVERY DAY  . losartan (COZAAR) 25 MG tablet Take 50 mg by mouth daily.  .Marland Kitchen  metoprolol (LOPRESSOR) 50 MG tablet Take 25 mg by mouth 2 (two) times daily.  . Multiple Vitamins-Minerals (MULTIPLE VITAMINS/WOMENS PO) Take 1 tablet by mouth daily. W/vitamin D  . pantoprazole (PROTONIX) 40 MG tablet Take 1 tablet (40 mg total) by mouth daily. Take 30-60 min before first meal of the day   No current facility-administered medications for this visit.  (Other)      REVIEW OF SYSTEMS: ROS    Positive for: Neurological, Genitourinary, Musculoskeletal, HENT, Cardiovascular, Eyes   Last edited by Matthew Folks, COA on 06/24/2019  9:53 AM. (History)       ALLERGIES Allergies  Allergen Reactions  . Meloxicam Hives  .  Other   . Nitrofuran Derivatives Rash    PAST MEDICAL HISTORY Past Medical History:  Diagnosis Date  . Anemia   . Arthritis   . Glaucoma    OU  . Gout, joint   . Hypertension   . Macular degeneration    OU  . NSTEMI (non-ST elevated myocardial infarction) ([place]) 08/2015  . Stroke [facility])    Past Surgical History:  Procedure Laterality Date  . ABDOMINAL HYSTERECTOMY    . BREAST SURGERY     CYST  . CARDIAC CATHETERIZATION N/A 08/18/2015   Procedure: Left Heart Cath and Coronary Angiography;  Surgeon: Troy Sine, MD;  Location: [place] [facility];  Service: Cardiovascular;  Laterality: N/A;  . CARDIAC CATHETERIZATION  08/18/2015   Procedure: Coronary Stent Intervention;  Surgeon: Troy Sine, MD;  Location: [place] [facility];  Service: Cardiovascular;;  . CATARACT EXTRACTION Bilateral   . DILATION AND CURETTAGE OF UTERUS    . EYE SURGERY Bilateral   . HERNIA REPAIR    . IRIDOTOMY / IRIDECTOMY Right   . JOINT REPLACEMENT  left knee  . REFRACTIVE SURGERY Right 2017    FAMILY HISTORY Family History  Problem Relation Age of Onset  . Heart disease Mother   . Stroke Mother     SOCIAL HISTORY Social History   Tobacco Use  . Smoking status: Never Smoker  . Smokeless tobacco: Never Used  Substance Use Topics  . Alcohol use: No    Alcohol/week: 0.0 standard drinks  . Drug use: No         OPHTHALMIC EXAM:  Base Eye Exam    Visual Acuity (Snellen - Linear)      Right Left   Dist [place] CF @ 2' 20/25 -2   Dist ph  NI NI       Tonometry (Tonopen, 9:56 AM)      Right Left   Pressure 10 12       Pupils      Dark Light Shape React APD   Right 3 2 Round Brisk None   Left 3 2 Round Brisk None       Visual Fields (Counting fingers)      Left Right    Full Full       Extraocular Movement      Right Left    Full, Ortho Full, Ortho       Neuro/Psych    Oriented x3: Yes   Mood/Affect: Normal       Dilation    Both eyes: 1.0% Mydriacyl, 2.5%  Phenylephrine @ 9:56 AM        Slit Lamp and Fundus Exam    Slit Lamp Exam      Right Left   Lids/Lashes Dermatochalasis - upper lid, Telangiectasia Dermatochalasis - upper lid,  Telangiectasia   Conjunctiva/Sclera White and quiet White and quiet   Cornea Arcus, 1+ Punctate epithelial erosions, Well healed cataract wounds Arcus, 2-3+ Punctate epithelial erosions, Well healed cataract wounds   Anterior Chamber Deep and quiet Deep and quiet   Iris Round and moderately dilated to 5mm, No NVI Round and moderately dilated to 5mm   Lens PC IOL in good position with open PC PC IOL in good position with open PC   Vitreous Vitreous syneresis Vitreous syneresis       Fundus Exam      Right Left   Disc 360 Peripapillary atrophy, 3+ Optic disc edema, +cupping, thin inferior rim 1+ Pallor, Sharp rim, +cupping, temporal Peripapillary atrophy   C/D Ratio 0.8 0.6   Macula Blunted foveal reflex, RPE mottling and clumping, +IRH/SRH, ?+CNV Flat, Blunted foveal reflex, RPE mottling and clumping, No heme or edema   Vessels Vascular attenuation, Tortuous, AV crossing changes, Copper wiring Vascular attenuation, mild Tortuousity   Periphery Attached, DBH inferior hemisphere    Attached, No heme         Refraction    Manifest Refraction      Sphere Cylinder Dist VA   Right [place] Sphere CF @ 2'   Left [place] Sphere 20/25-2          IMAGING AND PROCEDURES  Imaging and Procedures for @TODAY @  OCT, Retina - OU - Both Eyes       Right Eye Quality was good. Central Foveal Thickness: 603. Progression has no prior data. Findings include abnormal foveal contour, subretinal hyper-reflective material, subretinal fluid, intraretinal fluid, intraretinal hyper-reflective material, epiretinal membrane.   Left Eye Quality was good. Central Foveal Thickness: 253. Progression has no prior data. Findings include normal foveal contour, no IRF, no SRF, retinal drusen .   Notes *Images captured and stored on  drive  Diagnosis / Impression:  OD: CRVO with +IRF/SRF, + low-lying PED w/ overlying SRF -- ?+CNVM / exu ARMD component OS: NFP, no IRF/SRF, +drusen  Clinical management:  See below  Abbreviations: NFP - Normal foveal profile. CME - cystoid macular edema. PED - pigment epithelial detachment. IRF - intraretinal fluid. SRF - subretinal fluid. EZ - ellipsoid zone. ERM - epiretinal membrane. ORA - outer retinal atrophy. ORT - outer retinal tubulation. SRHM - subretinal hyper-reflective material        Intravitreal Injection, Pharmacologic Agent - OD - Right Eye       Time Out 06/24/2019. 11:37 AM. Confirmed correct patient, procedure, site, and patient consented.   Anesthesia Topical anesthesia was used. Anesthetic medications included Lidocaine 2%, Proparacaine 0.5%.   Procedure Preparation included 5% betadine to ocular surface, eyelid speculum. A 30 gauge needle was used.   Injection:  1.25 mg Bevacizumab (AVASTIN) SOLN   NDC: 73220-254-27, Lot: [id number] , Expiration date: 08/20/2019   Route: Intravitreal, Site: Right Eye, Waste: 0 mL  Post-op Post injection exam found visual acuity of at least counting fingers. The patient tolerated the procedure well. There were no complications. The patient received written and verbal post procedure care education.                 ASSESSMENT/PLAN:    ICD-10-CM   1. Branch retinal vein occlusion of right eye with macular edema  H34.8310 Intravitreal Injection, Pharmacologic Agent - OD - Right Eye    Bevacizumab (AVASTIN) SOLN 1.25 mg  2. Retinal edema  H35.81 OCT, Retina - OU - Both Eyes  3. 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 Intermediate stage nonexudative age-related macular degeneration of left eye  H35.3122   5. Essential hypertension  I10   6.  Hypertensive retinopathy of both eyes  H35.033   7. Primary open angle glaucoma (POAG) of both eyes, severe stage  H40.1133   8. Pseudophakia of both eyes  Z96.1     1,2. Inferior HRVO w/ CME, OD  - The natural history of retinal vein occlusion and macular edema and treatment options including observation, laser photocoagulation, and intravitreal antiVEGF injection with Avastin and Lucentis and Eylea and intravitreal injection of steroids with triamcinolone and Ozurdex and the complications of these procedures including loss of vision, infection, cataract, glaucoma, and retinal detachment were discussed with patient.  - Specifically discussed findings from CRUISE / BRAVO study regarding patient stabilization with anti-VEGF agents and increased potential for visual improvements.  Also discussed need for frequent follow up and potentially multiple injections given the chronic nature of the disease process  - BCVA CF 2'  - pt subjectively reports decline in vision OD since Feb 2020  - OCT shows severe CME/IRF temporal macula, +SRF overlying low PED (?exudative ARMD component)  - recommend IVA OD #1 today, 09.14.20  - pt wishes to proceed  - RBA of procedure discussed, questions answered  - informed consent obtained and signed  - see procedure note  - f/u 4 weeks  3. Exudative age related macular degeneration, OD    - The incidence pathology and anatomy of wet AMD discussed   - The ANCHOR, MARINA, CATT and VIEW trials discussed with patient.    - discussed treatment options including observation vs intravitreal anti-VEGF agents such as Avastin, Lucentis, Eylea.    - Risks of endophthalmitis and vascular occlusive events and atrophic changes discussed with patient  - OCT shows +IRF/SRF -- SRF overlying low lying PED  - exam shows +CNVM with surrounding heme  - suspect exudative ARMD component complicating HRVO w/ CME  - IVA OD #1 as above  - f/u in 4 wks  4. Age related macular degeneration,  non-exudative, OS  - The incidence, anatomy, and pathology of dry AMD, risk of progression, and the AREDS and AREDS 2 study including smoking risks discussed with patient.  - Recommend amsler grid monitoring  - f/u 3 months  5,6. Hypertensive retinopathy OU  - discussed importance of tight BP control  - monitor  7. POAG OU -- severe stage  - under the expert management of Dr. Marchelle Gearing  - s/p SLT OD 3.23.2017  - IOP good today 10 OD, 12 OS  - Latanoprost QHS OD  - Dorzolamide-Timolol BID OU  - Rhopressa QHS OD  8. Pseudophakia OU  - s/p CE/IOL OU  - beautiful surgeries, doing well  - monitor   Ophthalmic Meds Ordered this visit:  Meds ordered this encounter  Medications  . Bevacizumab (AVASTIN) SOLN 1.25 mg       Return for f/u 4-5 weeks, HRVO OD, DFE, OCT.  There are no Patient Instructions on file for this visit.   Explained the diagnoses, plan, and follow up with the patient and they expressed understanding.  Patient expressed understanding of the importance of proper follow up care.   This document serves as a record of services personally performed by Karie Chimera, MD, PhD. It was created on their behalf by Laurian Brim,  OA, an ophthalmic assistant. The creation of this record is the provider's dictation and/or activities during the visit.    Electronically signed by: Laurian BrimAmanda Brown, OA  09.13.2020 5:00 PM    Karie ChimeraBrian G. Daeshaun Specht, M.D., Ph.D. Diseases & Surgery of the Retina and Vitreous [facility]  I have reviewed the above documentation for accuracy and completeness, and I agree with the above. Karie ChimeraBrian G. Taivon Haroon, M.D., Ph.D. 06/24/19 5:00 PM     Abbreviations: M myopia (nearsighted); A astigmatism; H hyperopia (farsighted); P presbyopia; Mrx spectacle prescription;  CTL contact lenses; OD right eye; OS left eye; OU both eyes  XT exotropia; ET esotropia; PEK punctate epithelial keratitis; PEE punctate epithelial erosions; DES dry eye  syndrome; MGD meibomian gland dysfunction; ATs artificial tears; PFAT's preservative free artificial tears; NSC nuclear sclerotic cataract; PSC posterior subcapsular cataract; ERM epi-retinal membrane; PVD posterior vitreous detachment; RD retinal detachment; DM diabetes mellitus; DR diabetic retinopathy; NPDR non-proliferative diabetic retinopathy; PDR proliferative diabetic retinopathy; CSME clinically significant macular edema; DME diabetic macular edema; dbh dot blot hemorrhages; CWS cotton wool spot; POAG primary open angle glaucoma; C/D cup-to-disc ratio; HVF humphrey visual field; GVF goldmann visual field; OCT optical coherence tomography; IOP intraocular pressure; BRVO Branch retinal vein occlusion; CRVO central retinal vein occlusion; CRAO central retinal artery occlusion; BRAO branch retinal artery occlusion; RT retinal tear; SB scleral buckle; PPV pars plana vitrectomy; VH Vitreous hemorrhage; PRP panretinal laser photocoagulation; IVK intravitreal kenalog; VMT vitreomacular traction; MH Macular hole;  NVD neovascularization of the disc; NVE neovascularization elsewhere; AREDS age related eye disease study; ARMD age related macular degeneration; POAG primary open angle glaucoma; EBMD epithelial/anterior basement membrane dystrophy; ACIOL anterior chamber intraocular lens; IOL intraocular lens; PCIOL posterior chamber intraocular lens; Phaco/IOL phacoemulsification with intraocular lens placement; PRK photorefractive keratectomy; LASIK laser assisted in situ keratomileusis; HTN hypertension; DM diabetes mellitus; COPD chronic obstructive pulmonary disease

## 2019-06-24 ENCOUNTER — Other Ambulatory Visit: Payer: Self-pay

## 2019-06-24 ENCOUNTER — Encounter (INDEPENDENT_AMBULATORY_CARE_PROVIDER_SITE_OTHER): Payer: Self-pay | Admitting: Ophthalmology

## 2019-06-24 ENCOUNTER — Ambulatory Visit (INDEPENDENT_AMBULATORY_CARE_PROVIDER_SITE_OTHER): Payer: Medicare Other | Admitting: Ophthalmology

## 2019-06-24 DIAGNOSIS — H353122 Nonexudative age-related macular degeneration, left eye, intermediate dry stage: Secondary | ICD-10-CM

## 2019-06-24 DIAGNOSIS — H3581 Retinal edema: Secondary | ICD-10-CM | POA: Diagnosis not present

## 2019-06-24 DIAGNOSIS — H353211 Exudative age-related macular degeneration, right eye, with active choroidal neovascularization: Secondary | ICD-10-CM | POA: Diagnosis not present

## 2019-06-24 DIAGNOSIS — H34831 Tributary (branch) retinal vein occlusion, right eye, with macular edema: Secondary | ICD-10-CM

## 2019-06-24 DIAGNOSIS — H35033 Hypertensive retinopathy, bilateral: Secondary | ICD-10-CM

## 2019-06-24 DIAGNOSIS — Z961 Presence of intraocular lens: Secondary | ICD-10-CM

## 2019-06-24 DIAGNOSIS — I10 Essential (primary) hypertension: Secondary | ICD-10-CM

## 2019-06-24 DIAGNOSIS — H401133 Primary open-angle glaucoma, bilateral, severe stage: Secondary | ICD-10-CM

## 2019-06-24 MED ORDER — BEVACIZUMAB CHEMO INJECTION 1.25MG/0.05ML SYRINGE FOR KALEIDOSCOPE
1.2500 mg | INTRAVITREAL | Status: AC | PRN
  Administered 2019-06-24: 17:00:00 1.25 mg via INTRAVITREAL

## 2019-06-27 ENCOUNTER — Other Ambulatory Visit: Payer: Self-pay

## 2019-06-27 ENCOUNTER — Ambulatory Visit (INDEPENDENT_AMBULATORY_CARE_PROVIDER_SITE_OTHER): Payer: Medicare Other | Admitting: Podiatry

## 2019-06-27 DIAGNOSIS — L97511 Non-pressure chronic ulcer of other part of right foot limited to breakdown of skin: Secondary | ICD-10-CM | POA: Diagnosis not present

## 2019-06-27 NOTE — Progress Notes (Signed)
Subjective: 85 year old female presents the office today with her daughter for follow-up evaluation of a wound on the bottom of her right foot.  Overall the wound is getting better but the calluses formed over the wound.  They have been keeping medihoney on the wound daily.  Denies any drainage or pus.  No new concerns.  Denies any systemic complaints such as fevers, chills, nausea, vomiting. No acute changes since last appointment, and no other complaints at this time.   Objective: AAO x3, NAD DP/PT pulses palpable bilaterally, CRT less than 3 seconds On the right foot submetatarsal is a hyperkeratotic lesion.  Upon debridement there is still a superficial wound measuring 0.3 x 0.1 cm.  There is no probing to bone, undermining or tunneling.  Just distal to this area is some dried blood in her callus but there is no open sore.  No fluctuation crepitation. No open lesions or pre-ulcerative lesions.  No pain with calf compression, swelling, warmth, erythema      Assessment: Right foot ulceration with improvement  Plan: -All treatment options discussed with the patient including all alternatives, risks, complications.  -Utilizing a number 312 with scalpel I sharply debrided hyperkeratotic tissue in the wound down to healthy granular tissue..  Continue with medihoney dressing changes daily.  Offloading at all times.  When we see her back next appointment she will be measured for diabetic shoes.  She had a wound for several years stay closer about 1 year but she stopped wearing the inserts. -Reviewed arterial studies.  Should have adequate circulation in order to heal the wound. -Patient encouraged to call the office with any questions, concerns, change in symptoms.   Trula Slade DPM

## 2019-06-28 DIAGNOSIS — N390 Urinary tract infection, site not specified: Secondary | ICD-10-CM | POA: Diagnosis not present

## 2019-06-28 DIAGNOSIS — R8271 Bacteriuria: Secondary | ICD-10-CM | POA: Diagnosis not present

## 2019-06-28 DIAGNOSIS — R350 Frequency of micturition: Secondary | ICD-10-CM | POA: Diagnosis not present

## 2019-07-04 ENCOUNTER — Other Ambulatory Visit: Payer: Self-pay

## 2019-07-04 ENCOUNTER — Ambulatory Visit (INDEPENDENT_AMBULATORY_CARE_PROVIDER_SITE_OTHER): Payer: Medicare Other | Admitting: Podiatry

## 2019-07-04 DIAGNOSIS — L97511 Non-pressure chronic ulcer of other part of right foot limited to breakdown of skin: Secondary | ICD-10-CM | POA: Diagnosis not present

## 2019-07-04 DIAGNOSIS — G609 Hereditary and idiopathic neuropathy, unspecified: Secondary | ICD-10-CM

## 2019-07-10 NOTE — Progress Notes (Addendum)
Subjective: 85 year old female presents the office today with her daughter for follow-up evaluation of a wound on the bottom of her right foot.  Overall she has been doing better and she has been continuing with medihoney until recently as the wound appears to be closed.  Denies any pain, swelling or redness or any drainage. Denies any systemic complaints such as fevers, chills, nausea, vomiting. No acute changes since last appointment, and no other complaints at this time.   Objective: AAO x3, NAD DP/PT pulses palpable bilaterally, CRT less than 3 seconds On the right foot submetatarsal is a hyperkeratotic lesion.  Upon debridement there is no underlying ulceration but area is pre-ulcerative.  Appears that the wound is healed.  No surrounding erythema, ascending cellulitis.  No fluctuation or crepitation or any malodor.  There is starting to be some erythema along the bunion as well as the fifth metatarsal head from a surgical shoe she thinks.  No open sores. Prominent metatarsal heads. No open lesions or pre-ulcerative lesions.  No pain with calf compression, swelling, warmth, erythema   Assessment: Right foot ulceration which is healed  Plan: -All treatment options discussed with the patient including all alternatives, risks, complications.  -Utilizing a number 312 with scalpel I sharply debrided hyperkeratotic tissue to reveal the underlying ulceration is healed.  Recommend moisturizer daily as well as offloading.  She can return to regular shoe with offloading as I do not want to wear the surgical shoe is causing irritation.  She states that Dr. Elisha Ponder wanted to see her back after the wound is healed.  I will refer back to Dr. Elisha Ponder.  Monitor for any reoccurrence.  Return in about 3 weeks (around 07/25/2019).  Trula Slade DPM

## 2019-07-16 DIAGNOSIS — H40012 Open angle with borderline findings, low risk, left eye: Secondary | ICD-10-CM | POA: Diagnosis not present

## 2019-07-16 DIAGNOSIS — H401113 Primary open-angle glaucoma, right eye, severe stage: Secondary | ICD-10-CM | POA: Diagnosis not present

## 2019-07-16 DIAGNOSIS — H04123 Dry eye syndrome of bilateral lacrimal glands: Secondary | ICD-10-CM | POA: Diagnosis not present

## 2019-07-16 DIAGNOSIS — H34811 Central retinal vein occlusion, right eye, with macular edema: Secondary | ICD-10-CM | POA: Diagnosis not present

## 2019-07-16 DIAGNOSIS — H353131 Nonexudative age-related macular degeneration, bilateral, early dry stage: Secondary | ICD-10-CM | POA: Diagnosis not present

## 2019-07-24 NOTE — Progress Notes (Signed)
[facility] - Clinic Note  07/30/2019     CHIEF COMPLAINT Patient presents for Retina Follow Up   HISTORY OF PRESENT ILLNESS: [name] is a 85 y.o. female who presents to the clinic today for:   HPI    Retina Follow Up    Patient presents with  Other.  In both eyes.  This started 5 weeks ago.  Severity is moderate.  I, the attending physician,  performed the HPI with the patient and updated documentation appropriately.          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 Patient states vision doing ok. No eye pain. Patient hard of hearing.        Last edited by Rennis ChrisZamora, Milaina Sher, MD on 07/30/2019 10:06 AM. (History)    pt states she feels like her vision has improved since receiving the injection at last visit   Referring physician: Sallye LatGroat, Christopher, MD [address] [place] [id number]  HISTORICAL INFORMATION:   Selected notes from the MEDICAL RECORD NUMBER Referred by Dr. Marchelle Gearinghris Groat for concern of RVO OS LEE:  Ocular Hx- PMH-   CURRENT MEDICATIONS: Current Outpatient Medications (Ophthalmic Drugs)  Medication Sig  . bimatoprost (LUMIGAN) 0.01 % SOLN Place 1 drop into the right eye at bedtime.  . brimonidine (ALPHAGAN) 0.2 % ophthalmic solution brimonidine 0.2 % eye drops  INSTILL 1 DROP INTO BOTH EYES TWICE A DAY  . dorzolamide-timolol (COSOPT) 22.3-6.8 MG/ML ophthalmic solution Place 1 drop into both eyes 2 (two) times daily.   Marland Kitchen. latanoprost (XALATAN) 0.005 % ophthalmic solution latanoprost 0.005 % eye drops  INSTILL 1 DROP INTO RIGHT EYE AT BEDTIME  . Netarsudil Dimesylate (RHOPRESSA) 0.02 % SOLN Rhopressa 0.02 % eye drops  INSTILL 1 DROP INTO AFFECTED EYE(S) BY OPHTHALMIC ROUTE ONCE DAILY INTHE EVENING  . Polyethyl Glycol-Propyl Glycol (SYSTANE OP) Apply 1 drop to eye 2 (two) times daily.   No current facility-administered medications for this visit.  (Ophthalmic Drugs)   Current  Outpatient Medications (Other)  Medication Sig  . Acetaminophen (TYLENOL) 325 MG CAPS Take 325 mg by mouth 3 (three) times daily as needed.  Marland Kitchen. aspirin 81 MG chewable tablet Chew 81 mg by mouth daily.  Marland Kitchen. atorvastatin (LIPITOR) 80 MG tablet Take 1 tablet (80 mg total) by mouth daily at 6 PM.  . Calcium Carbonate-Vitamin D (CALTRATE 600+D PO) Take 1 tablet by mouth 2 (two) times daily.  . chlorpheniramine (CHLOR-TRIMETON) 4 MG tablet Take 4 mg by mouth every 4 (four) hours as needed for allergies.  Marland Kitchen. diclofenac sodium (VOLTAREN) 1 % GEL diclofenac 1 % topical gel  AS DIRECTED TWICE A DAY AS NEEDED TRANSDERMAL 14 DAYS  . famotidine (PEPCID) 20 MG tablet Take 20 mg by mouth daily.  . furosemide (LASIX) 20 MG tablet furosemide 20 mg tablet  TAKE 1 TABLET BY MOUTH EVERY DAY  . losartan (COZAAR) 25 MG tablet Take 50 mg by mouth daily.  Marland Kitchen. losartan (COZAAR) 50 MG tablet   . metoprolol (LOPRESSOR) 50 MG tablet Take 25 mg by mouth 2 (two) times daily.  . Multiple Vitamins-Minerals (MULTIPLE VITAMINS/WOMENS PO) Take 1 tablet by mouth daily. W/vitamin D  . pantoprazole (PROTONIX) 40 MG tablet Take 1 tablet (40 mg total) by mouth daily. Take 30-60 min before first meal of the day   No current facility-administered medications for this visit.  (Other)  REVIEW OF SYSTEMS: ROS    Positive for: Neurological, Genitourinary, Musculoskeletal, HENT, Cardiovascular, Eyes   Last edited by Laddie Aquas, COA on 07/30/2019  8:37 AM. (History)       ALLERGIES Allergies  Allergen Reactions  . Meloxicam Hives  . Other   . Nitrofuran Derivatives Rash    PAST MEDICAL HISTORY Past Medical History:  Diagnosis Date  . Anemia   . Arthritis   . Glaucoma    OU  . Gout, joint   . Hypertension   . Macular degeneration    OU  . NSTEMI (non-ST elevated myocardial infarction) (HCC) 08/2015  . Stroke [facility])    Past Surgical History:  Procedure Laterality Date  . ABDOMINAL HYSTERECTOMY    . BREAST  SURGERY     CYST  . CARDIAC CATHETERIZATION N/A 08/18/2015   Procedure: Left Heart Cath and Coronary Angiography;  Surgeon: Lennette Bihari, MD;  Location: [facility] INVASIVE CV LAB;  Service: Cardiovascular;  Laterality: N/A;  . CARDIAC CATHETERIZATION  08/18/2015   Procedure: Coronary Stent Intervention;  Surgeon: Lennette Bihari, MD;  Location: [facility] INVASIVE CV LAB;  Service: Cardiovascular;;  . CATARACT EXTRACTION Bilateral   . DILATION AND CURETTAGE OF UTERUS    . EYE SURGERY Bilateral   . HERNIA REPAIR    . IRIDOTOMY / IRIDECTOMY Right   . JOINT REPLACEMENT  left knee  . REFRACTIVE SURGERY Right 2017    FAMILY HISTORY Family History  Problem Relation Age of Onset  . Heart disease Mother   . Stroke Mother     SOCIAL HISTORY Social History   Tobacco Use  . Smoking status: Never Smoker  . Smokeless tobacco: Never Used  Substance Use Topics  . Alcohol use: No    Alcohol/week: 0.0 standard drinks  . Drug use: No         OPHTHALMIC EXAM:  Base Eye Exam    Visual Acuity (Snellen - Linear)      Right Left   Dist [place] CF 2' 20/25 -2   Dist ph  NI NI  Patient uses readers.       Tonometry (Tonopen, 8:33 AM)      Right Left   Pressure 17 10       Pupils      Dark Light Shape React APD   Right 3 2 Round Brisk None   Left 3 2 Round Brisk None       Visual Fields (Counting fingers)      Left Right    Full Full       Extraocular Movement      Right Left    Full, Ortho Full, Ortho       Neuro/Psych    Oriented x3: Yes   Mood/Affect: Normal       Dilation    Both eyes: 1.0% Mydriacyl, 2.5% Phenylephrine @ 8:33 AM        Slit Lamp and Fundus Exam    Slit Lamp Exam      Right Left   Lids/Lashes Dermatochalasis - upper lid, Telangiectasia Dermatochalasis - upper lid, Telangiectasia   Conjunctiva/Sclera White and quiet White and quiet   Cornea Arcus, 1+ Punctate epithelial erosions, Well healed cataract wounds Arcus, 2-3+ Punctate epithelial erosions, Well  healed cataract wounds   Anterior Chamber Deep and quiet Deep and quiet   Iris Round and moderately dilated to 59mm, No NVI Round and moderately dilated to 38mm   Lens PC IOL in good position with  open PC PC IOL in good position with open PC   Vitreous Vitreous syneresis, Posterior vitreous detachment Vitreous syneresis, Posterior vitreous detachment       Fundus Exam      Right Left   Disc Tilted disc, sharp rim, +cupping, +pallor 1+ Pallor, Sharp rim, +cupping, temporal Peripapillary atrophy   C/D Ratio 0.65 0.6   Macula Blunted foveal reflex, RPE mottling and clumping, interval improvement in IRH/SRH, ?+CNV Flat, Blunted foveal reflex, RPE mottling and clumping, No heme or edema   Vessels Vascular attenuation, Tortuous, AV crossing changes, Copper wiring Vascular attenuation, mild Tortuousity   Periphery Attached, DBH inferior hemisphere -- improved Attached, No heme           IMAGING AND PROCEDURES  Imaging and Procedures for @  OCT, Retina - OU - Both Eyes       Right Eye Quality was good. Central Foveal Thickness: 292. Progression has improved. Findings include subretinal hyper-reflective material, intraretinal fluid, intraretinal hyper-reflective material, epiretinal membrane, normal foveal contour, no SRF.   Left Eye Quality was good. Central Foveal Thickness: 252. Progression has been stable. Findings include normal foveal contour, no IRF, no SRF, retinal drusen .   Notes *Images captured and stored on drive  Diagnosis / Impression:  OD: HRVO with interval improvement in +IRF, +low-lying PED/SRHM w/ overlying SRF improved -- ?+CNVM / exu ARMD component OS: NFP, no IRF/SRF, +drusen  Clinical management:  See below  Abbreviations: NFP - Normal foveal profile. CME - cystoid macular edema. PED - pigment epithelial detachment. IRF - intraretinal fluid. SRF - subretinal fluid. EZ - ellipsoid zone. ERM - epiretinal membrane. ORA - outer retinal atrophy. ORT - outer  retinal tubulation. SRHM - subretinal hyper-reflective material        Intravitreal Injection, Pharmacologic Agent - OD - Right Eye       Time Out 07/30/2019. 8:43 AM. Confirmed correct patient, procedure, site, and patient consented.   Anesthesia Topical anesthesia was used. Anesthetic medications included Lidocaine 2%, Tetracaine 0.5%.   Procedure Preparation included 5% betadine to ocular surface. A supplied needle was used.   Injection:  1.25 mg Bevacizumab (AVASTIN) SOLN   NDC: 16109-604-54, Lot: [id number] , Expiration date: 09/25/2019   Route: Intravitreal, Site: Right Eye, Waste: 0 mg  Post-op Post injection exam found visual acuity of at least counting fingers. The patient tolerated the procedure well. There were no complications. The patient received written and verbal post procedure care education.                 ASSESSMENT/PLAN:    ICD-10-CM   1. Branch retinal vein occlusion of right eye with macular edema  H34.8310 Intravitreal Injection, Pharmacologic Agent - OD - Right Eye    Bevacizumab (AVASTIN) SOLN 1.25 mg  2. Retinal edema  H35.81 OCT, Retina - OU - Both Eyes  3. Exudative age-related macular degeneration of right eye with active choroidal neovascularization (HCC)  H35.3211   4. Intermediate stage nonexudative age-related macular degeneration of left eye  H35.3122   5. Essential hypertension  I10   6. Hypertensive retinopathy of both eyes  H35.033   7. Primary open angle glaucoma (POAG) of both eyes, severe stage  H40.1133   8. Pseudophakia of both eyes  Z96.1     1,2. Inferior HRVO w/ CME, OD  - presenting BCVA CF 2' (9.14.2020)  - pt subjectively reports decline in vision OD since Feb 2020  - presenting OCT shows severe CME/IRF temporal macula, +SRF overlying low  PED (?exudative ARMD component)  - S/p IVA OD #1 09.14.20  - OCT shows significant interval improvement in IRF, but BCVA remains CF 2' likely due to central ORA  - exam also shows  interval improvement in intraretinal heme             - Recommend IVA OD #2 today, 10.20.20  - pt wishes to proceed  - RBA of procedure discussed, questions answered  - informed consent obtained and signed  - see procedure note  - f/u 4 weeks  3. Exudative age related macular degeneration, OD    - initial OCT showed +IRF/SRF -- SRF overlying low lying PED  - exam shows +CNVM with surrounding heme -- improved today  - suspect exudative ARMD component complicating HRVO w/ CME  - S/p IVA OD #1 09.14.20   - recommend IVA OD #2 as above  - f/u in 4 wks  4. Age related macular degeneration, non-exudative, OS  - The incidence, anatomy, and pathology of dry AMD, risk of progression, and the AREDS and AREDS 2 study including smoking risks discussed with patient.  - Recommend amsler grid monitoring  5,6. Hypertensive retinopathy OU  - discussed importance of tight BP control  - monitor  7. POAG OU -- severe stage  - under the expert management of Dr. Marchelle Gearing  - s/p SLT OD 3.23.2017  - IOP good today 17 OD, 10 OS  - Latanoprost QHS OD  - Dorzolamide-Timolol BID OU  - Rhopressa QHS OD  8. Pseudophakia OU  - s/p CE/IOL OU  - beautiful surgeries, doing well  - monitor   Ophthalmic Meds Ordered this visit:  Meds ordered this encounter  Medications  . Bevacizumab (AVASTIN) SOLN 1.25 mg       Return in about 4 weeks (around 08/27/2019) for f/u HRVO with CME OD, DFE, OCT.  There are no Patient Instructions on file for this visit.   Explained the diagnoses, plan, and follow up with the patient and they expressed understanding.  Patient expressed understanding of the importance of proper follow up care.   Electronically signed by: Cristopher Estimable, COT 07/24/19 9:10 a.m.  This document serves as a record of services personally performed by Karie Chimera, MD, PhD. It was created on their behalf by Laurian Brim, OA, an ophthalmic assistant. The creation of this record is the  provider's dictation and/or activities during the visit.    Electronically signed by: Laurian Brim, OA 10.20.2020 12:50 PM  Karie Chimera, M.D., Ph.D. Diseases & Surgery of the Retina and Vitreous [facility] 07/30/19  I have reviewed the above documentation for accuracy and completeness, and I agree with the above. Karie Chimera, M.D., Ph.D. 07/30/19 12:50 PM    Abbreviations: M myopia (nearsighted); A astigmatism; H hyperopia (farsighted); P presbyopia; Mrx spectacle prescription;  CTL contact lenses; OD right eye; OS left eye; OU both eyes  XT exotropia; ET esotropia; PEK punctate epithelial keratitis; PEE punctate epithelial erosions; DES dry eye syndrome; MGD meibomian gland dysfunction; ATs artificial tears; PFAT's preservative free artificial tears; NSC nuclear sclerotic cataract; PSC posterior subcapsular cataract; ERM epi-retinal membrane; PVD posterior vitreous detachment; RD retinal detachment; DM diabetes mellitus; DR diabetic retinopathy; NPDR non-proliferative diabetic retinopathy; PDR proliferative diabetic retinopathy; CSME clinically significant macular edema; DME diabetic macular edema; dbh dot blot hemorrhages; CWS cotton wool spot; POAG primary open angle glaucoma; C/D cup-to-disc ratio; HVF humphrey visual field; GVF goldmann visual field; OCT optical coherence tomography;  IOP intraocular pressure; BRVO Branch retinal vein occlusion; CRVO central retinal vein occlusion; CRAO central retinal artery occlusion; BRAO branch retinal artery occlusion; RT retinal tear; SB scleral buckle; PPV pars plana vitrectomy; VH Vitreous hemorrhage; PRP panretinal laser photocoagulation; IVK intravitreal kenalog; VMT vitreomacular traction; MH Macular hole;  NVD neovascularization of the disc; NVE neovascularization elsewhere; AREDS age related eye disease study; ARMD age related macular degeneration; POAG primary open angle glaucoma; EBMD epithelial/anterior basement  membrane dystrophy; ACIOL anterior chamber intraocular lens; IOL intraocular lens; PCIOL posterior chamber intraocular lens; Phaco/IOL phacoemulsification with intraocular lens placement; [place] photorefractive keratectomy; LASIK laser assisted in situ keratomileusis; HTN hypertension; DM diabetes mellitus; COPD chronic obstructive pulmonary disease

## 2019-07-30 ENCOUNTER — Ambulatory Visit (INDEPENDENT_AMBULATORY_CARE_PROVIDER_SITE_OTHER): Payer: Medicare Other | Admitting: Ophthalmology

## 2019-07-30 ENCOUNTER — Encounter (INDEPENDENT_AMBULATORY_CARE_PROVIDER_SITE_OTHER): Payer: Self-pay | Admitting: Ophthalmology

## 2019-07-30 DIAGNOSIS — Z961 Presence of intraocular lens: Secondary | ICD-10-CM

## 2019-07-30 DIAGNOSIS — H35033 Hypertensive retinopathy, bilateral: Secondary | ICD-10-CM

## 2019-07-30 DIAGNOSIS — H353211 Exudative age-related macular degeneration, right eye, with active choroidal neovascularization: Secondary | ICD-10-CM

## 2019-07-30 DIAGNOSIS — H353122 Nonexudative age-related macular degeneration, left eye, intermediate dry stage: Secondary | ICD-10-CM

## 2019-07-30 DIAGNOSIS — H34831 Tributary (branch) retinal vein occlusion, right eye, with macular edema: Secondary | ICD-10-CM

## 2019-07-30 DIAGNOSIS — H3581 Retinal edema: Secondary | ICD-10-CM

## 2019-07-30 DIAGNOSIS — I10 Essential (primary) hypertension: Secondary | ICD-10-CM | POA: Diagnosis not present

## 2019-07-30 DIAGNOSIS — H401133 Primary open-angle glaucoma, bilateral, severe stage: Secondary | ICD-10-CM

## 2019-07-30 MED ORDER — BEVACIZUMAB CHEMO INJECTION 1.25MG/0.05ML SYRINGE FOR KALEIDOSCOPE
1.2500 mg | INTRAVITREAL | Status: AC | PRN
  Administered 2019-07-30: 1.25 mg via INTRAVITREAL

## 2019-08-02 ENCOUNTER — Encounter: Payer: Self-pay | Admitting: Podiatry

## 2019-08-02 ENCOUNTER — Other Ambulatory Visit: Payer: Self-pay

## 2019-08-02 ENCOUNTER — Ambulatory Visit: Payer: Medicare Other | Admitting: Podiatry

## 2019-08-02 DIAGNOSIS — G629 Polyneuropathy, unspecified: Secondary | ICD-10-CM

## 2019-08-02 DIAGNOSIS — M79675 Pain in left toe(s): Secondary | ICD-10-CM

## 2019-08-02 DIAGNOSIS — B351 Tinea unguium: Secondary | ICD-10-CM

## 2019-08-02 DIAGNOSIS — M79674 Pain in right toe(s): Secondary | ICD-10-CM

## 2019-08-02 DIAGNOSIS — L97511 Non-pressure chronic ulcer of other part of right foot limited to breakdown of skin: Secondary | ICD-10-CM

## 2019-08-02 NOTE — Patient Instructions (Signed)

## 2019-08-04 NOTE — Progress Notes (Signed)
Subjective:   [name] presents for continued care of ulceration of right foot.  Daughter has been performing daily dressing changes to right foot daily utilizing Medihoney.  Daughter states her Mom has been using her modified Darco shoe made by Dr. Amalia [name] She states she went out of town for a few days and a relative changed the dressing on her [name] foot. Relative applied Silipos pads to right foot and related new breakdowns on 1st and 5th metatarsal heads. She stated areas were bloody, but they look better now. Daughter has been applying nonadherent dressings with Medihoney.   Patient denies any fever, chills, nightsweats, nausea or vomiting.  Current Outpatient Medications on File Prior to Visit  Medication Sig Dispense Refill  . Acetaminophen (TYLENOL) 325 MG CAPS Take 325 mg by mouth 3 (three) times daily as needed.    Marland Kitchen aspirin 81 MG chewable tablet Chew 81 mg by mouth daily.    Marland Kitchen atorvastatin (LIPITOR) 80 MG tablet Take 1 tablet (80 mg total) by mouth daily at 6 PM. 60 tablet 0  . bimatoprost (LUMIGAN) 0.01 % SOLN Place 1 drop into the right eye at bedtime.    . brimonidine (ALPHAGAN) 0.2 % ophthalmic solution brimonidine 0.2 % eye drops  INSTILL 1 DROP INTO BOTH EYES TWICE A DAY    . Calcium Carbonate-Vitamin D (CALTRATE 600+D PO) Take 1 tablet by mouth 2 (two) times daily.    . chlorpheniramine (CHLOR-TRIMETON) 4 MG tablet Take 4 mg by mouth every 4 (four) hours as needed for allergies.    Marland Kitchen diclofenac sodium (VOLTAREN) 1 % GEL diclofenac 1 % topical gel  AS DIRECTED TWICE A DAY AS NEEDED TRANSDERMAL 14 DAYS    . dorzolamide-timolol (COSOPT) 22.3-6.8 MG/ML ophthalmic solution Place 1 drop into both eyes 2 (two) times daily.     . famotidine (PEPCID) 20 MG tablet Take 20 mg by mouth daily.    . furosemide (LASIX) 20 MG tablet furosemide 20 mg tablet  TAKE 1 TABLET BY MOUTH EVERY DAY    . latanoprost (XALATAN) 0.005 % ophthalmic solution latanoprost 0.005 % eye drops  INSTILL 1 DROP INTO RIGHT EYE AT BEDTIME    . losartan (COZAAR) 25 MG tablet Take 50 mg by mouth daily.    Marland Kitchen losartan (COZAAR) 50 MG tablet     . metoprolol (LOPRESSOR) 50 MG tablet Take 25 mg by mouth 2 (two) times daily.    . Multiple Vitamins-Minerals (MULTIPLE VITAMINS/WOMENS PO) Take 1 tablet by mouth daily. W/vitamin D    . Netarsudil Dimesylate (RHOPRESSA) 0.02 % SOLN Rhopressa 0.02 % eye drops  INSTILL 1 DROP INTO AFFECTED EYE(S) BY OPHTHALMIC ROUTE ONCE DAILY INTHE EVENING    . pantoprazole (PROTONIX) 40 MG tablet Take 1 tablet (40 mg total) by mouth daily. Take 30-60 min before first meal of the day 30 tablet 2  . Polyethyl Glycol-Propyl Glycol (SYSTANE OP) Apply 1 drop to eye 2 (two) times daily.     No current facility-administered medications on file prior to visit.      Allergies  Allergen Reactions  . Meloxicam Hives  . Other   . Nitrofuran Derivatives Rash     Objective:   Vascular Examination: Capillary refill time <3 seconds b/l.  Dorsalis pedis pulses faintly palpable b/l.  Posterior tibial pulses diminished b/l  Digital hair absent b/l.    Skin temperature gradient WNL b/l.  BLE edema with no breaks in skin.   Dermatological Examination: Skin thin, shiny  and atrophic b/l.  Toenails 1-5 b/l recently debrided with adequate length.   Ulceration located plantar aspect  right foot: Predebridement measurements carried out today of 1.5 x 1.0 cm with hyperkeratotic roof.  No periulcerative erythema, no edema, no drainage. Flocculence absent. Malodor absent.  Postdebridement measurements plantar aspect right foot today are: 1.5 x 1.0 cm.  No tracking, no tunneling, no undermining. No probing to bone, no purulent drainage.  No deep abscess evident.  Small wounds noted medial 1st metatarsal head right foot measuring 0.2 x 0.3 x 0.1 cm.   Small wound noted lateral 5th metatarsal head, epithelialized.  Musculoskeletal: Muscle strength 5/5 to all LE muscle  groups bilaterally.  HAV with bunion b/l. Hammertoes b/l 2nd digit.  Neurological: Sensation diminished bilaterally with 10 gram monofilament.  Assessment:   1.  Neurotrophic ulceration submet head 2 right foot, healed 2. Ulceration right 1st met head, healing 3. Ulceration right 5th met head, healed 4.   Peripheral neuropathy  Plan: 1. Ulcer submet head 2 right foot was debrided to the level of healthy, viable tissue. Ulcer was cleansed with wound cleanser. Povidine Ointment and band-aid was applied. 2. 1st metatarsal head and 5th metatarsal head  right foot was cleansed with wound cleanser. Povidine ointment and band-aids were applied to both.  3. Continue surgical shoe for now.  4. Patient was given instructions on offloading and dressing change/aftercare and was instructed to call immediately if any signs or symptoms of infection arise.  5. Patient is to follow up 3 weeks. 6. Patient instructed to report to emergency department with worsening appearance of ulcer/toe/foot, increased pain, foul odor, increased redness, swelling, drainage, fever, chills, nightsweats, nausea, vomiting, increased blood sugar.  7. Patient/POA related understanding.

## 2019-08-08 DIAGNOSIS — R350 Frequency of micturition: Secondary | ICD-10-CM | POA: Diagnosis not present

## 2019-08-23 DIAGNOSIS — M25561 Pain in right knee: Secondary | ICD-10-CM | POA: Diagnosis not present

## 2019-08-23 DIAGNOSIS — M1711 Unilateral primary osteoarthritis, right knee: Secondary | ICD-10-CM | POA: Diagnosis not present

## 2019-08-26 ENCOUNTER — Encounter: Payer: Self-pay | Admitting: Podiatry

## 2019-08-26 ENCOUNTER — Ambulatory Visit: Payer: Medicare Other | Admitting: Podiatry

## 2019-08-26 ENCOUNTER — Other Ambulatory Visit: Payer: Self-pay

## 2019-08-26 DIAGNOSIS — G629 Polyneuropathy, unspecified: Secondary | ICD-10-CM

## 2019-08-26 DIAGNOSIS — L97511 Non-pressure chronic ulcer of other part of right foot limited to breakdown of skin: Secondary | ICD-10-CM | POA: Diagnosis not present

## 2019-08-26 NOTE — Patient Instructions (Signed)

## 2019-08-28 ENCOUNTER — Encounter (INDEPENDENT_AMBULATORY_CARE_PROVIDER_SITE_OTHER): Payer: Medicare Other | Admitting: Ophthalmology

## 2019-08-30 NOTE — Progress Notes (Signed)
Subjective:  [name] presents for continued care of ulceration of right foot.  Patient has been performing daily dressing changes to foot daily. Pt. denies any new complaints.  Patient denies any fever, chills, nightsweats, nausea or vomiting.  Daughter is present during the visit.   [name] states she is having bunion pain right foot. Daughter feels [name] foot may not be sitting in Darco shoe correctly as most times she puts the shoe on herself and daughter usually has to adjust it.   She has had ulcers right 1st metatarsal and right 5th metatarsal heads.  Current Outpatient Medications on File Prior to Visit  Medication Sig Dispense Refill  . Acetaminophen (TYLENOL) 325 MG CAPS Take 325 mg by mouth 3 (three) times daily as needed.    Marland Kitchen aspirin 81 MG chewable tablet Chew 81 mg by mouth daily.    Marland Kitchen atorvastatin (LIPITOR) 80 MG tablet Take 1 tablet (80 mg total) by mouth daily at 6 PM. 60 tablet 0  . bimatoprost (LUMIGAN) 0.01 % SOLN Place 1 drop into the right eye at bedtime.    . brimonidine (ALPHAGAN) 0.2 % ophthalmic solution brimonidine 0.2 % eye drops  INSTILL 1 DROP INTO BOTH EYES TWICE A DAY    . Calcium Carbonate-Vitamin D (CALTRATE 600+D PO) Take 1 tablet by mouth 2 (two) times daily.    . chlorpheniramine (CHLOR-TRIMETON) 4 MG tablet Take 4 mg by mouth every 4 (four) hours as needed for allergies.    Marland Kitchen diclofenac sodium (VOLTAREN) 1 % GEL diclofenac 1 % topical gel  AS DIRECTED TWICE A DAY AS NEEDED TRANSDERMAL 14 DAYS    . dorzolamide-timolol (COSOPT) 22.3-6.8 MG/ML ophthalmic solution Place 1 drop into both eyes 2 (two) times daily.     . famotidine (PEPCID) 20 MG tablet Take 20 mg by mouth daily.    . furosemide (LASIX) 20 MG tablet furosemide 20 mg tablet  TAKE 1 TABLET BY MOUTH EVERY DAY    . latanoprost (XALATAN) 0.005 % ophthalmic solution latanoprost 0.005 % eye drops  INSTILL 1 DROP INTO RIGHT EYE AT BEDTIME    . losartan (COZAAR) 25 MG tablet Take 50 mg by  mouth daily.    Marland Kitchen losartan (COZAAR) 50 MG tablet     . metoprolol (LOPRESSOR) 50 MG tablet Take 25 mg by mouth 2 (two) times daily.    . Multiple Vitamins-Minerals (MULTIPLE VITAMINS/WOMENS PO) Take 1 tablet by mouth daily. W/vitamin D    . Netarsudil Dimesylate (RHOPRESSA) 0.02 % SOLN Rhopressa 0.02 % eye drops  INSTILL 1 DROP INTO AFFECTED EYE(S) BY OPHTHALMIC ROUTE ONCE DAILY INTHE EVENING    . pantoprazole (PROTONIX) 40 MG tablet Take 1 tablet (40 mg total) by mouth daily. Take 30-60 min before first meal of the day 30 tablet 2  . Polyethyl Glycol-Propyl Glycol (SYSTANE OP) Apply 1 drop to eye 2 (two) times daily.    Marland Kitchen Zoster Vaccine Adjuvanted [facility]) injection Shingrix (PF) 50 mcg/0.5 mL intramuscular suspension, kit     No current facility-administered medications on file prior to visit.      Allergies  Allergen Reactions  . Meloxicam Hives  . Other   . Nitrofuran Derivatives Rash    Objective:   Vascular Examination: There were no vitals filed for this visit.  Capillary refill time <3 seconds b/l.  Dorsalis pedis pulses 1/4 b/l.  Posterior tibial pulses diminished b/l.  Digital hair absent b/l.  Skin temperature gradient WNL b/l.  Dermatological Examination:  Skin thin, shiny and atrophic b/l.   Toenails 1-5 b/l adequate length on today.   Ulceration located medial 1st met head now completely epithelialized. It is tender to palpation, but no erythema, no edema, no drainage.   Ulceration right 5th metatarsal head completely epithlialized.  Ulceration plantar right foot completely resolved.  Musculoskeletal: Muscle strength 5/5 to all LE muscle groups bilaterally.  Bunion deformity b/l.  Hammertoes 2-5 b/l  Neurological: Sensation diminished bilaterally with 10 gram monofilament.  Assessment:   1.  Neurotrophic Ulceration plantar right foot healed 2.  Ulceration right 1st metatarsal head, healed 3. Ulceration right 5th metatarsal head,  healed 4. Peripheral neuropathy  Plan: 1. Ulcer right 1st metatarsal head was not debrided today as it is healed. Pedorthist offloaded area of pain right 1st metatarsal head in Darco shoe 2. Continue surgical shoe was dispensed for right foot. 3. Pedorthist will work on Customer service manager for [name] 4. Patient was given instructions on offloading and dressing. change/aftercare and was instructed to call immediately if any signs or symptoms of infection arise.  5. Patient is to follow up 2 weeks with Dr. Jacqualyn Posey. 6. Patient instructed to report to emergency department with worsening appearance of ulcer/toe/foot, increased pain, foul odor, increased redness, swelling, drainage, fever, chills, nightsweats, nausea, vomiting, increased blood sugar.  7. Patient/POA related understanding.

## 2019-09-10 NOTE — Progress Notes (Signed)
[facility] Note  09/11/2019     CHIEF COMPLAINT Patient presents for Retina Follow Up   HISTORY OF PRESENT ILLNESS: [name] is a 85 y.o. female who presents to the clinic today for:   HPI    Retina Follow Up    Patient presents with  CRVO/BRVO.  In right eye.  Severity is severe.  Duration of 4 weeks.  Since onset it is stable.  I, the attending physician,  performed the HPI with the patient and updated documentation appropriately.          Comments    Patient states vision the same OU. On latanoprost qhs OD, rhopressa qhs OD, and cosopt bid OU.        Last edited by Bernarda Caffey, MD on 09/11/2019 11:26 AM. (History)    pt states she cannot tell if the injections are helping her vision or not   Referring physician: Debbra Riding, MD [address] [place],  [place] [address]  HISTORICAL INFORMATION:   Selected notes from the MEDICAL RECORD NUMBER Referred by Dr. Midge Aver for concern of RVO OS LEE:  Ocular Hx- PMH-   CURRENT MEDICATIONS: Current Outpatient Medications (Ophthalmic Drugs)  Medication Sig  . bimatoprost (LUMIGAN) 0.01 % SOLN Place 1 drop into the right eye at bedtime.  . brimonidine (ALPHAGAN) 0.2 % ophthalmic solution brimonidine 0.2 % eye drops  INSTILL 1 DROP INTO BOTH EYES TWICE A DAY  . dorzolamide-timolol (COSOPT) 22.3-6.8 MG/ML ophthalmic solution Place 1 drop into both eyes 2 (two) times daily.   Marland Kitchen latanoprost (XALATAN) 0.005 % ophthalmic solution latanoprost 0.005 % eye drops  INSTILL 1 DROP INTO RIGHT EYE AT BEDTIME  . Netarsudil Dimesylate (RHOPRESSA) 0.02 % SOLN Rhopressa 0.02 % eye drops  INSTILL 1 DROP INTO AFFECTED EYE(S) BY OPHTHALMIC ROUTE ONCE DAILY INTHE EVENING  . Polyethyl Glycol-Propyl Glycol (SYSTANE OP) Apply 1 drop to eye 2 (two) times daily.   No current facility-administered medications for this visit.  (Ophthalmic Drugs)   Current Outpatient Medications (Other)  Medication  Sig  . Acetaminophen (TYLENOL) 325 MG CAPS Take 325 mg by mouth 3 (three) times daily as needed.  Marland Kitchen aspirin 81 MG chewable tablet Chew 81 mg by mouth daily.  Marland Kitchen atorvastatin (LIPITOR) 80 MG tablet Take 1 tablet (80 mg total) by mouth daily at 6 PM.  . Calcium Carbonate-Vitamin D (CALTRATE 600+D PO) Take 1 tablet by mouth 2 (two) times daily.  . chlorpheniramine (CHLOR-TRIMETON) 4 MG tablet Take 4 mg by mouth every 4 (four) hours as needed for allergies.  Marland Kitchen diclofenac sodium (VOLTAREN) 1 % GEL diclofenac 1 % topical gel  AS DIRECTED TWICE A DAY AS NEEDED TRANSDERMAL 14 DAYS  . famotidine (PEPCID) 20 MG tablet Take 20 mg by mouth daily.  . furosemide (LASIX) 20 MG tablet furosemide 20 mg tablet  TAKE 1 TABLET BY MOUTH EVERY DAY  . losartan (COZAAR) 25 MG tablet Take 50 mg by mouth daily.  . metoprolol (LOPRESSOR) 50 MG tablet Take 25 mg by mouth 2 (two) times daily.  . Multiple Vitamins-Minerals (MULTIPLE VITAMINS/WOMENS PO) Take 1 tablet by mouth daily. W/vitamin D  . pantoprazole (PROTONIX) 40 MG tablet Take 1 tablet (40 mg total) by mouth daily. Take 30-60 min before first meal of the day  . Zoster Vaccine Adjuvanted [facility]) injection Shingrix (PF) 50 mcg/0.5 mL intramuscular suspension, kit  . losartan (COZAAR) 50 MG tablet  No current facility-administered medications for this visit.  (Other)      REVIEW OF SYSTEMS: ROS    Positive for: Neurological, Genitourinary, Musculoskeletal, HENT, Cardiovascular, Eyes   Negative for: Constitutional, Gastrointestinal, Skin, Endocrine, Respiratory, Psychiatric, Allergic/Imm, Heme/Lymph   Last edited by Roselee Nova D, COT on 09/11/2019  9:45 AM. (History)       ALLERGIES Allergies  Allergen Reactions  . Meloxicam Hives  . Other   . Nitrofuran Derivatives Rash    PAST MEDICAL HISTORY Past Medical History:  Diagnosis Date  . Anemia   . Arthritis   . Glaucoma    OU  . Gout, joint   . Hypertension   . Macular degeneration     OU  . NSTEMI (non-ST elevated myocardial infarction) ([place]) 08/2015  . Stroke [facility])    Past Surgical History:  Procedure Laterality Date  . ABDOMINAL HYSTERECTOMY    . BREAST SURGERY     CYST  . CARDIAC CATHETERIZATION N/A 08/18/2015   Procedure: Left Heart Cath and Coronary Angiography;  Surgeon: Troy Sine, MD;  Location: [facility];  Service: Cardiovascular;  Laterality: N/A;  . CARDIAC CATHETERIZATION  08/18/2015   Procedure: Coronary Stent Intervention;  Surgeon: Troy Sine, MD;  Location: [facility] [facility];  Service: Cardiovascular;;  . CATARACT EXTRACTION Bilateral   . DILATION AND CURETTAGE OF UTERUS    . EYE SURGERY Bilateral   . HERNIA REPAIR    . IRIDOTOMY / IRIDECTOMY Right   . JOINT REPLACEMENT  left knee  . REFRACTIVE SURGERY Right 2017    FAMILY HISTORY Family History  Problem Relation Age of Onset  . Heart disease Mother   . Stroke Mother     SOCIAL HISTORY Social History   Tobacco Use  . Smoking status: Never Smoker  . Smokeless tobacco: Never Used  Substance Use Topics  . Alcohol use: No    Alcohol/week: 0.0 standard drinks  . Drug use: No         OPHTHALMIC EXAM:  Base Eye Exam    Visual Acuity (Snellen - Linear)      Right Left   Dist [place] CF at 3' 20/30 -1   Dist ph [place]  20/25 -1   Dist ph cc NI        Tonometry (Tonopen, 9:54 AM)      Right Left   Pressure 21 10       Pupils      Dark Light Shape React APD   Right 3 3 Round None Trace   Left 3 2 Round Slow None       Visual Fields (Counting fingers)      Left Right    Full    Restrictions  Central scotoma       Extraocular Movement      Right Left    Full, Ortho Full, Ortho       Neuro/Psych    Oriented x3: Yes   Mood/Affect: Normal       Dilation    Both eyes: 1.0% Mydriacyl, 2.5% Phenylephrine @ 9:54 AM        Slit Lamp and Fundus Exam    Slit Lamp Exam      Right Left   Lids/Lashes Dermatochalasis - upper lid, Telangiectasia Dermatochalasis -  upper lid, Telangiectasia   Conjunctiva/Sclera White and quiet White and quiet   Cornea Arcus, 1+ Punctate epithelial erosions, Well healed cataract wounds Arcus, 2-3+ Punctate epithelial erosions, Well healed cataract  wounds   Anterior Chamber Deep and quiet Deep and quiet   Iris Round and moderately dilated to 61m, No NVI Round and moderately dilated to 538m  Lens PC IOL in good position with open PC PC IOL in good position with open PC   Vitreous Vitreous syneresis, Posterior vitreous detachment Vitreous syneresis, Posterior vitreous detachment       Fundus Exam      Right Left   Disc Tilted disc, sharp rim, +cupping, 3+pallor, 360 PPA 1+ Pallor, Sharp rim, +cupping, temporal Peripapillary atrophy   C/D Ratio 0.8 0.6   Macula Blunted foveal reflex, RPE mottling and clumping, interval improvement in IRH/SRH, ?+CNV Flat, Blunted foveal reflex, RPE mottling and clumping, No heme or edema   Vessels Vascular attenuation, Tortuous, AV crossing changes, Copper wiring Vascular attenuation, mild Tortuousity   Periphery Attached, DBH inferior hemisphere -- improved Attached, No heme           IMAGING AND PROCEDURES  Imaging and Procedures for '@TODAY'$ @  OCT, Retina - OU - Both Eyes       Right Eye Quality was good. Central Foveal Thickness: 285. Progression has improved. Findings include subretinal hyper-reflective material, intraretinal fluid, intraretinal hyper-reflective material, epiretinal membrane, normal foveal contour, no SRF, outer retinal atrophy (Mild interval improvement in IRF).   Left Eye Quality was good. Central Foveal Thickness: 250. Progression has been stable. Findings include normal foveal contour, no IRF, no SRF, retinal drusen .   Notes *Images captured and stored on drive  Diagnosis / Impression:  OD: HRVO with interval improvement in +IRF, +low-lying PED/SRHM w/ overlying SRF improved -- ?+CNVM / exu ARMD component OS: NFP, no IRF/SRF, +drusen  Clinical  management:  See below  Abbreviations: NFP - Normal foveal profile. CME - cystoid macular edema. PED - pigment epithelial detachment. IRF - intraretinal fluid. SRF - subretinal fluid. EZ - ellipsoid zone. ERM - epiretinal membrane. ORA - outer retinal atrophy. ORT - outer retinal tubulation. SRHM - subretinal hyper-reflective material        Intravitreal Injection, Pharmacologic Agent - OD - Right Eye       Time Out 09/11/2019. 9:57 AM. Confirmed correct patient, procedure, site, and patient consented.   Anesthesia Topical anesthesia was used. Anesthetic medications included Lidocaine 2%, Proparacaine 0.5%.   Procedure Preparation included 5% betadine to ocular surface, eyelid speculum. A 30 gauge needle was used.   Injection:  1.25 mg Bevacizumab (AVASTIN) SOLN   NDC: 5027782-423-53Lot: [id number]'@15'$ , Expiration date: 11/15/2019   Route: Intravitreal, Site: Right Eye, Waste: 0 mL  Post-op Post injection exam found visual acuity of at least counting fingers. The patient tolerated the procedure well. There were no complications. The patient received written and verbal post procedure care education.                 ASSESSMENT/PLAN:    ICD-10-CM   1. Branch retinal vein occlusion of right eye with macular edema  H34.8310 Intravitreal Injection, Pharmacologic Agent - OD - Right Eye  2. Retinal edema  H35.81 OCT, Retina - OU - Both Eyes  3. Exudative age-related macular degeneration of right eye with active choroidal neovascularization (HCSun H35.3211   4. Intermediate stage nonexudative age-related macular degeneration of left eye  H35.3122   5. Essential hypertension  I10   6. Hypertensive retinopathy of both eyes  H35.033   7. Primary open angle glaucoma (POAG) of both eyes, severe stage  H40.1133   8. Pseudophakia of both eyes  Z96.1     1,2. Inferior HRVO w/ CME, OD  - presenting BCVA CF 2' (9.14.2020)  - pt subjectively reports decline in vision OD since Feb 2020  -  presenting OCT shows severe CME/IRF temporal macula, +SRF overlying low PED (?exudative ARMD component)  - S/p IVA OD #1 09.14.20, #2 (10.20.20)  - OCT shows significant interval improvement in IRF, but BCVA remains CF 2' likely due to central ORA  - exam also shows interval improvement in intraretinal heme             - Recommend IVA OD #3 today, 12.02.20  - pt wishes to proceed  - RBA of procedure discussed, questions answered  - informed consent obtained and signed  - see procedure note  - f/u 5 weeks  3. Exudative age related macular degeneration, OD    - initial OCT showed +IRF/SRF -- SRF overlying low lying PED  - exam shows +CNVM with surrounding heme -- improved today  - suspect exudative ARMD component complicating HRVO w/ CME  - S/p IVA OD #1 09.14.20, #2 (10.20.20)   - recommend IVA OD #3 as above  - f/u in 4 weeks  4. Age related macular degeneration, non-exudative, OS  - The incidence, anatomy, and pathology of dry AMD, risk of progression, and the AREDS and AREDS 2 study including smoking risks discussed with patient.  - Recommend amsler grid monitoring  5,6. Hypertensive retinopathy OU  - discussed importance of tight BP control  - monitor  7. POAG OU -- severe stage  - under the expert management of Dr. Midge Aver  - s/p SLT OD 3.23.2017  - IOP elevated today at 21 OD, OS okay at 10  - Latanoprost QHS OD  - Dorzolamide-Timolol BID OU  - Rhopressa QHS OD  8. Pseudophakia OU  - s/p CE/IOL OU  - beautiful surgeries, doing well  - monitor   Ophthalmic Meds Ordered this visit:  No orders of the defined types were placed in this encounter.      Return in about 5 weeks (around 10/16/2019) for f/u inferior HRVO with CME OD, DFE, OCT.  There are no Patient Instructions on file for this visit.   Explained the diagnoses, plan, and follow up with the patient and they expressed understanding.  Patient expressed understanding of the importance of proper follow up  care.   This document serves as a record of services personally performed by Gardiner Sleeper, MD, PhD. It was created on their behalf by Roselee Nova, COMT. The creation of this record is the provider's dictation and/or activities during the visit.  Electronically signed by: Roselee Nova, COMT 09/11/19 10:19 PM   This document serves as a record of services personally performed by Gardiner Sleeper, MD, PhD. It was created on their behalf by Ernest Mallick, OA, an ophthalmic assistant. The creation of this record is the provider's dictation and/or activities during the visit.    Electronically signed by: Ernest Mallick, OA 12.02.2020 10:19 PM   Gardiner Sleeper, M.D., Ph.D. Diseases & Surgery of the Retina and [place] 09/11/2019   I have reviewed the above documentation for accuracy and completeness, and I agree with the above. Gardiner Sleeper, M.D., Ph.D. 09/11/19 10:19 PM   Abbreviations: M myopia (nearsighted); A astigmatism; H hyperopia (farsighted); P presbyopia; Mrx spectacle prescription;  CTL contact lenses; OD right eye; OS left eye; OU both eyes  XT exotropia; ET esotropia; PEK punctate epithelial  keratitis; PEE punctate epithelial erosions; DES dry eye syndrome; MGD meibomian gland dysfunction; ATs artificial tears; PFAT's preservative free artificial tears; Early nuclear sclerotic cataract; PSC posterior subcapsular cataract; ERM epi-retinal membrane; PVD posterior vitreous detachment; RD retinal detachment; DM diabetes mellitus; DR diabetic retinopathy; NPDR non-proliferative diabetic retinopathy; PDR proliferative diabetic retinopathy; CSME clinically significant macular edema; DME diabetic macular edema; dbh dot blot hemorrhages; CWS cotton wool spot; POAG primary open angle glaucoma; C/D cup-to-disc ratio; HVF humphrey visual field; GVF goldmann visual field; OCT optical coherence tomography; IOP intraocular pressure; BRVO Branch retinal vein occlusion;  CRVO central retinal vein occlusion; CRAO central retinal artery occlusion; BRAO branch retinal artery occlusion; RT retinal tear; SB scleral buckle; PPV pars plana vitrectomy; VH Vitreous hemorrhage; PRP panretinal laser photocoagulation; IVK intravitreal kenalog; VMT vitreomacular traction; MH Macular hole;  NVD neovascularization of the disc; NVE neovascularization elsewhere; AREDS age related eye disease study; ARMD age related macular degeneration; POAG primary open angle glaucoma; EBMD epithelial/anterior basement membrane dystrophy; ACIOL anterior chamber intraocular lens; IOL intraocular lens; PCIOL posterior chamber intraocular lens; Phaco/IOL phacoemulsification with intraocular lens placement; [place] photorefractive keratectomy; LASIK laser assisted in situ keratomileusis; HTN hypertension; DM diabetes mellitus; COPD chronic obstructive pulmonary disease

## 2019-09-11 ENCOUNTER — Encounter (INDEPENDENT_AMBULATORY_CARE_PROVIDER_SITE_OTHER): Payer: Self-pay | Admitting: Ophthalmology

## 2019-09-11 ENCOUNTER — Ambulatory Visit (INDEPENDENT_AMBULATORY_CARE_PROVIDER_SITE_OTHER): Payer: Medicare Other | Admitting: Ophthalmology

## 2019-09-11 DIAGNOSIS — H353211 Exudative age-related macular degeneration, right eye, with active choroidal neovascularization: Secondary | ICD-10-CM

## 2019-09-11 DIAGNOSIS — I10 Essential (primary) hypertension: Secondary | ICD-10-CM

## 2019-09-11 DIAGNOSIS — H34831 Tributary (branch) retinal vein occlusion, right eye, with macular edema: Secondary | ICD-10-CM

## 2019-09-11 DIAGNOSIS — H35033 Hypertensive retinopathy, bilateral: Secondary | ICD-10-CM

## 2019-09-11 DIAGNOSIS — H3581 Retinal edema: Secondary | ICD-10-CM | POA: Diagnosis not present

## 2019-09-11 DIAGNOSIS — Z961 Presence of intraocular lens: Secondary | ICD-10-CM

## 2019-09-11 DIAGNOSIS — H353122 Nonexudative age-related macular degeneration, left eye, intermediate dry stage: Secondary | ICD-10-CM | POA: Diagnosis not present

## 2019-09-11 DIAGNOSIS — H401133 Primary open-angle glaucoma, bilateral, severe stage: Secondary | ICD-10-CM

## 2019-09-11 MED ORDER — BEVACIZUMAB CHEMO INJECTION 1.25MG/0.05ML SYRINGE FOR KALEIDOSCOPE
1.2500 mg | INTRAVITREAL | Status: AC | PRN
  Administered 2019-09-11: 22:00:00 1.25 mg via INTRAVITREAL

## 2019-09-12 ENCOUNTER — Other Ambulatory Visit: Payer: Self-pay

## 2019-09-12 ENCOUNTER — Ambulatory Visit: Payer: Medicare Other | Admitting: Orthotics

## 2019-09-12 ENCOUNTER — Ambulatory Visit: Payer: Medicare Other | Admitting: Podiatry

## 2019-09-12 DIAGNOSIS — M21619 Bunion of unspecified foot: Secondary | ICD-10-CM | POA: Diagnosis not present

## 2019-09-12 DIAGNOSIS — G629 Polyneuropathy, unspecified: Secondary | ICD-10-CM

## 2019-09-12 DIAGNOSIS — L84 Corns and callosities: Secondary | ICD-10-CM

## 2019-09-12 DIAGNOSIS — L97511 Non-pressure chronic ulcer of other part of right foot limited to breakdown of skin: Secondary | ICD-10-CM

## 2019-09-12 DIAGNOSIS — M2041 Other hammer toe(s) (acquired), right foot: Secondary | ICD-10-CM

## 2019-09-12 NOTE — Progress Notes (Signed)
Apis confirmatin [id number]  Ordering apis shoes 2 pairs to try to fit.

## 2019-09-17 NOTE — Progress Notes (Signed)
Subjective: 85 year old female presents the office today with a family member for follow-up evaluation of a wound of the right foot.  This states that the wounds have healed there is no drainage but she still having discomfort in the right foot mostly on the area of bunion that is having some redness but denies any open sores as well as on the tailor's bunion.  The wounds in the bottom of the foot is healed.  No increase in swelling no redness or drainage. Denies any systemic complaints such as fevers, chills, nausea, vomiting. No acute changes since last appointment, and no other complaints at this time.   Objective: AAO x3, NAD DP/PT pulses decreased bilaterally Preulcerative areas on the right foot on the medial aspect the first metatarsal on the bunion as well as a tailor's bunion.  Minimal hyperkeratotic tissue on the plantar aspect of the foot and there is no underlying ulceration identified today.  There is prominent metatarsal heads plantarly.  There is no other open lesions identified at this time. No open lesions or pre-ulcerative lesions.  No pain with calf compression, swelling, warmth, erythema  Assessment: Preulcerative areas right foot  Plan: -All treatment options discussed with the patient including all alternatives, risks, complications.  -Today I did make her bunion pads to help take pressure off the bunion.  I would debride some of the hyperkeratotic tissue on the plantar aspect of foot without any complications or bleeding.  Continue offloading at all times.  She is all right today as well for inserts. -Patient encouraged to call the office with any questions, concerns, change in symptoms.   Trula Slade DPM

## 2019-09-27 DIAGNOSIS — M810 Age-related osteoporosis without current pathological fracture: Secondary | ICD-10-CM | POA: Diagnosis not present

## 2019-10-09 DIAGNOSIS — L03019 Cellulitis of unspecified finger: Secondary | ICD-10-CM | POA: Diagnosis not present

## 2019-10-14 ENCOUNTER — Ambulatory Visit: Payer: Medicare Other | Admitting: Orthotics

## 2019-10-14 ENCOUNTER — Other Ambulatory Visit: Payer: Self-pay

## 2019-10-14 ENCOUNTER — Encounter: Payer: Self-pay | Admitting: Podiatry

## 2019-10-14 ENCOUNTER — Ambulatory Visit: Payer: Medicare Other | Admitting: Podiatry

## 2019-10-14 DIAGNOSIS — L97511 Non-pressure chronic ulcer of other part of right foot limited to breakdown of skin: Secondary | ICD-10-CM | POA: Diagnosis not present

## 2019-10-14 DIAGNOSIS — G629 Polyneuropathy, unspecified: Secondary | ICD-10-CM | POA: Diagnosis not present

## 2019-10-14 DIAGNOSIS — L84 Corns and callosities: Secondary | ICD-10-CM

## 2019-10-14 NOTE — Patient Instructions (Signed)
Diabetes Mellitus and Foot Care Foot care is an important part of your health, especially when you have diabetes. Diabetes may cause you to have problems because of poor blood flow (circulation) to your feet and legs, which can cause your skin to:  Become thinner and drier.  Break more easily.  Heal more slowly.  Peel and crack. You may also have nerve damage (neuropathy) in your legs and feet, causing decreased feeling in them. This means that you may not notice minor injuries to your feet that could lead to more serious problems. Noticing and addressing any potential problems early is the best way to prevent future foot problems. How to care for your feet Foot hygiene  Wash your feet daily with warm water and mild soap. Do not use hot water. Then, pat your feet and the areas between your toes until they are completely dry. Do not soak your feet as this can dry your skin.  Trim your toenails straight across. Do not dig under them or around the cuticle. File the edges of your nails with an emery board or nail file.  Apply a moisturizing lotion or petroleum jelly to the skin on your feet and to dry, brittle toenails. Use lotion that does not contain alcohol and is unscented. Do not apply lotion between your toes. Shoes and socks  Wear clean socks or stockings every day. Make sure they are not too tight. Do not wear knee-high stockings since they may decrease blood flow to your legs.  Wear shoes that fit properly and have enough cushioning. Always look in your shoes before you put them on to be sure there are no objects inside.  To break in new shoes, wear them for just a few hours a day. This prevents injuries on your feet. Wounds, scrapes, corns, and calluses  Check your feet daily for blisters, cuts, bruises, sores, and redness. If you cannot see the bottom of your feet, use a mirror or ask someone for help.  Do not cut corns or calluses or try to remove them with medicine.  If you  find a minor scrape, cut, or break in the skin on your feet, keep it and the skin around it clean and dry. You may clean these areas with mild soap and water. Do not clean the area with peroxide, alcohol, or iodine.  If you have a wound, scrape, corn, or callus on your foot, look at it several times a day to make sure it is healing and not infected. Check for: ? Redness, swelling, or pain. ? Fluid or blood. ? Warmth. ? Pus or a bad smell. General instructions  Do not cross your legs. This may decrease blood flow to your feet.  Do not use heating pads or hot water bottles on your feet. They may burn your skin. If you have lost feeling in your feet or legs, you may not know this is happening until it is too late.  Protect your feet from hot and cold by wearing shoes, such as at the beach or on hot pavement.  Schedule a complete foot exam at least once a year (annually) or more often if you have foot problems. If you have foot problems, report any cuts, sores, or bruises to your health care provider immediately. Contact a health care provider if:  You have a medical condition that increases your risk of infection and you have any cuts, sores, or bruises on your feet.  You have an injury that is not   healing.  You have redness on your legs or feet.  You feel burning or tingling in your legs or feet.  You have pain or cramps in your legs and feet.  Your legs or feet are numb.  Your feet always feel cold.  You have pain around a toenail. Get help right away if:  You have a wound, scrape, corn, or callus on your foot and: ? You have pain, swelling, or redness that gets worse. ? You have fluid or blood coming from the wound, scrape, corn, or callus. ? Your wound, scrape, corn, or callus feels warm to the touch. ? You have pus or a bad smell coming from the wound, scrape, corn, or callus. ? You have a fever. ? You have a red line going up your leg. Summary  Check your feet every day  for cuts, sores, red spots, swelling, and blisters.  Moisturize feet and legs daily.  Wear shoes that fit properly and have enough cushioning.  If you have foot problems, report any cuts, sores, or bruises to your health care provider immediately.  Schedule a complete foot exam at least once a year (annually) or more often if you have foot problems. This information is not intended to replace advice given to you by your health care provider. Make sure you discuss any questions you have with your health care provider. Document Revised: 06/19/2019 Document Reviewed: 10/28/2016 [employer]vier Patient Education  2020 [employer]  

## 2019-10-15 NOTE — Progress Notes (Addendum)
[facility] Note  10/16/2019     CHIEF COMPLAINT Patient presents for Retina Follow Up   HISTORY OF PRESENT ILLNESS: [name] is a 86 y.o. female who presents to the clinic today for:  HPI    Retina Follow Up    Patient presents with  Other.  In right eye.  This started weeks ago.  Severity is moderate.  Duration of weeks.  Since onset it is stable.  I, the attending physician,  performed the HPI with the patient and updated documentation appropriately.          Comments    Pt states her vision is stable but fuzzy at times.  She denies eye pain or discomfort OU and denies any new or worsening floaters or fol OU.       Last edited by Bernarda Caffey, MD on 10/16/2019  1:23 PM. (History)    Pt states no change in her vision, she states her eyes are stinging and red today  Referring physician: Warden Fillers, MD [address] [place],  [place] [address]  HISTORICAL INFORMATION:   Selected notes from the MEDICAL RECORD NUMBER Referred by Dr. Midge Aver for concern of RVO OS LEE:  Ocular Hx- PMH-   CURRENT MEDICATIONS: Current Outpatient Medications (Ophthalmic Drugs)  Medication Sig  . bimatoprost (LUMIGAN) 0.01 % SOLN Place 1 drop into the right eye at bedtime.  . brimonidine (ALPHAGAN) 0.2 % ophthalmic solution brimonidine 0.2 % eye drops  INSTILL 1 DROP INTO BOTH EYES TWICE A DAY  . dorzolamide-timolol (COSOPT) 22.3-6.8 MG/ML ophthalmic solution Place 1 drop into both eyes 2 (two) times daily.   Marland Kitchen latanoprost (XALATAN) 0.005 % ophthalmic solution latanoprost 0.005 % eye drops  INSTILL 1 DROP INTO RIGHT EYE AT BEDTIME  . Netarsudil Dimesylate (RHOPRESSA) 0.02 % SOLN Rhopressa 0.02 % eye drops  INSTILL 1 DROP INTO AFFECTED EYE(S) BY OPHTHALMIC ROUTE ONCE DAILY INTHE EVENING  . Polyethyl Glycol-Propyl Glycol (SYSTANE OP) Apply 1 drop to eye 2 (two) times daily.   No current facility-administered medications for this visit.  (Ophthalmic Drugs)   Current Outpatient Medications (Other)  Medication Sig  . Acetaminophen (TYLENOL) 325 MG CAPS Take 325 mg by mouth 3 (three) times daily as needed.  Marland Kitchen aspirin 81 MG chewable tablet Chew 81 mg by mouth daily.  Marland Kitchen atorvastatin (LIPITOR) 80 MG tablet Take 1 tablet (80 mg total) by mouth daily at 6 PM.  . Calcium Carbonate-Vitamin D (CALTRATE 600+D PO) Take 1 tablet by mouth 2 (two) times daily.  . chlorpheniramine (CHLOR-TRIMETON) 4 MG tablet Take 4 mg by mouth every 4 (four) hours as needed for allergies.  Marland Kitchen diclofenac sodium (VOLTAREN) 1 % GEL diclofenac 1 % topical gel  AS DIRECTED TWICE A DAY AS NEEDED TRANSDERMAL 14 DAYS  . famotidine (PEPCID) 20 MG tablet Take 20 mg by mouth daily.  . furosemide (LASIX) 20 MG tablet furosemide 20 mg tablet  TAKE 1 TABLET BY MOUTH EVERY DAY  . losartan (COZAAR) 25 MG tablet Take 50 mg by mouth daily.  Marland Kitchen losartan (COZAAR) 50 MG tablet   . metoprolol (LOPRESSOR) 50 MG tablet Take 25 mg by mouth 2 (two) times daily.  . Multiple Vitamins-Minerals (MULTIPLE VITAMINS/WOMENS PO) Take 1 tablet by mouth daily. W/vitamin D  . pantoprazole (PROTONIX) 40 MG tablet Take 1 tablet (40 mg total) by mouth daily. Take 30-60 min before first meal of the day  . Zoster Vaccine  Adjuvanted [facility]) injection Shingrix (PF) 50 mcg/0.5 mL intramuscular suspension, kit   No current facility-administered medications for this visit. (Other)      REVIEW OF SYSTEMS: ROS    Positive for: Neurological, Genitourinary, Musculoskeletal, HENT, Cardiovascular, Eyes   Negative for: Constitutional, Gastrointestinal, Skin, Endocrine, Respiratory, Psychiatric, Allergic/Imm, Heme/Lymph   Last edited by Doneen Poisson on 10/16/2019  9:36 AM. (History)       ALLERGIES Allergies  Allergen Reactions  . Meloxicam Hives  . Other   . Nitrofuran Derivatives Rash    PAST MEDICAL HISTORY Past Medical History:  Diagnosis Date  . Anemia   . Arthritis   . Glaucoma     OU  . Gout, joint   . Hypertension   . Macular degeneration    OU  . NSTEMI (non-ST elevated myocardial infarction) ([place]) 08/2015  . Stroke [facility])    Past Surgical History:  Procedure Laterality Date  . ABDOMINAL HYSTERECTOMY    . BREAST SURGERY     CYST  . CARDIAC CATHETERIZATION N/A 08/18/2015   Procedure: Left Heart Cath and Coronary Angiography;  Surgeon: Troy Sine, MD;  Location: [facility];  Service: Cardiovascular;  Laterality: N/A;  . CARDIAC CATHETERIZATION  08/18/2015   Procedure: Coronary Stent Intervention;  Surgeon: Troy Sine, MD;  Location: [facility];  Service: Cardiovascular;;  . CATARACT EXTRACTION Bilateral   . DILATION AND CURETTAGE OF UTERUS    . EYE SURGERY Bilateral   . HERNIA REPAIR    . IRIDOTOMY / IRIDECTOMY Right   . JOINT REPLACEMENT  left knee  . REFRACTIVE SURGERY Right 2017    FAMILY HISTORY Family History  Problem Relation Age of Onset  . Heart disease Mother   . Stroke Mother     SOCIAL HISTORY Social History   Tobacco Use  . Smoking status: Never Smoker  . Smokeless tobacco: Never Used  Substance Use Topics  . Alcohol use: No    Alcohol/week: 0.0 standard drinks  . Drug use: No         OPHTHALMIC EXAM:  Base Eye Exam    Visual Acuity (Snellen - Linear)      Right Left   Dist [place] CF '@3' ' 20/60 +2   Dist ph  NI 20/30 -1       Tonometry (Tonopen, 9:38 AM)      Right Left   Pressure 24 12       Pupils      Dark Light Shape React APD   Right 3 2 Round Minimal 0   Left 3 2 Round Minimal 0       Extraocular Movement      Right Left    Full Full       Neuro/Psych    Oriented x3: Yes   Mood/Affect: Normal       Dilation    Both eyes: 1.0% Mydriacyl, 2.5% Phenylephrine @ 9:38 AM        Slit Lamp and Fundus Exam    Slit Lamp Exam      Right Left   Lids/Lashes Dermatochalasis - upper lid, Telangiectasia, Meibomian gland dysfunction Dermatochalasis - upper lid, Telangiectasia    Conjunctiva/Sclera 1-2+ Injection White and quiet   Cornea Arcus, 2-3+ inferior Punctate epithelial erosions, Well healed cataract wounds Arcus, 2-3+ Punctate epithelial erosions, Well healed cataract wounds   Anterior Chamber Deep and quiet Deep and quiet   Iris Round and moderately dilated to 45m, No NVI Round and  moderately dilated to 39m   Lens PC IOL in good position with open PC PC IOL in good position with open PC   Vitreous Vitreous syneresis, Posterior vitreous detachment Vitreous syneresis, Posterior vitreous detachment       Fundus Exam      Right Left   Disc Tilted disc, thin inferior rim, +cupping, 3+pallor, 360 PPA 1+ Pallor, Sharp rim, +cupping, temporal Peripapillary atrophy   C/D Ratio 0.8 0.6   Macula Blunted foveal reflex, RPE mottling and clumping, interval improvement in IRH/SRH, ?+CNV Flat, Blunted foveal reflex, RPE mottling and clumping, No heme or edema   Vessels Vascular attenuation, Tortuous Vascular attenuation, mild Tortuousity   Periphery Attached, DBH inferior hemisphere -- improved Attached, No heme           IMAGING AND PROCEDURES  Imaging and Procedures for '@TODAY' @  OCT, Retina - OU - Both Eyes       Right Eye Quality was good. Central Foveal Thickness: 252. Progression has worsened. Findings include subretinal hyper-reflective material, intraretinal fluid, intraretinal hyper-reflective material, epiretinal membrane, normal foveal contour, no SRF, outer retinal atrophy (Mild interval increase in IRF).   Left Eye Quality was good. Central Foveal Thickness: 252. Progression has been stable. Findings include normal foveal contour, no IRF, no SRF, retinal drusen .   Notes *Images captured and stored on drive  Diagnosis / Impression:  OD: HRVO with interval increase in +IRF, +low-lying PED/SRHM w/ overlying SRF improved -- +CNVM / exu ARMD component OS: NFP, no IRF/SRF, +drusen  Clinical management:  See below  Abbreviations: NFP - Normal foveal  profile. CME - cystoid macular edema. PED - pigment epithelial detachment. IRF - intraretinal fluid. SRF - subretinal fluid. EZ - ellipsoid zone. ERM - epiretinal membrane. ORA - outer retinal atrophy. ORT - outer retinal tubulation. SRHM - subretinal hyper-reflective material        Intravitreal Injection, Pharmacologic Agent - OD - Right Eye       Time Out 10/16/2019. 9:45 AM. Confirmed correct patient, procedure, site, and patient consented.   Anesthesia Topical anesthesia was used. Anesthetic medications included Lidocaine 2%, Proparacaine 0.5%.   Procedure Preparation included 5% betadine to ocular surface, eyelid speculum. A supplied needle was used.   Injection:  1.25 mg Bevacizumab (AVASTIN) SOLN   NDC: 529937-169-67 Lot: [id number]' , Expiration date: 11/13/2019   Route: Intravitreal, Site: Right Eye, Waste: 0 mL  Post-op Post injection exam found visual acuity of at least counting fingers. The patient tolerated the procedure well. There were no complications. The patient received written and verbal post procedure care education.   Notes An AC tap was performed following injection due to elevated IOP using a 30 gauge needle on a syringe with the plunger removed. The needle was placed at the limbus at 7 oclock and approximately 0.1 cc of aqueous was removed from the anterior chamber. Betadine was applied to the tap area before and after the paracentesis was performed. There were no complications. The patient tolerated the procedure well. The IOP was rechecked and was found to be ~8 mmHg by digital palpation.                 ASSESSMENT/PLAN:    ICD-10-CM   1. Branch retinal vein occlusion of right eye with macular edema  H34.8310 Intravitreal Injection, Pharmacologic Agent - OD - Right Eye    Bevacizumab (AVASTIN) SOLN 1.25 mg  2. Retinal edema  H35.81 OCT, Retina - OU - Both Eyes  3. Exudative age-related macular  degeneration of right eye with active choroidal  neovascularization (HCC)  H35.3211 Intravitreal Injection, Pharmacologic Agent - OD - Right Eye    Bevacizumab (AVASTIN) SOLN 1.25 mg  4. Intermediate stage nonexudative age-related macular degeneration of left eye  H35.3122   5. Essential hypertension  I10   6. Hypertensive retinopathy of both eyes  H35.033   7. Primary open angle glaucoma (POAG) of both eyes, severe stage  H40.1133   8. Pseudophakia of both eyes  Z96.1     1,2. Inferior HRVO w/ CME, OD  - presenting BCVA CF 2' (9.14.2020)  - pt subjectively reports decline in vision OD since Feb 2020  - presenting OCT shows severe CME/IRF temporal macula, +SRF overlying low PED (?exudative ARMD component)  - S/p IVA OD #1 09.14.20, #2 (10.20.20), #3 (12.02.20)  - OCT shows interval increase in IRF, and BCVA remains CF 3' likely due to central ORA  - exam also shows interval improvement in intraretinal heme             - Recommend IVA OD #4 today, 01.06.21  - pt wishes to proceed  - RBA of procedure discussed, questions answered  - informed consent obtained and signed  - see procedure note  - f/u 4 weeks  3. Exudative age related macular degeneration, OD    - initial OCT showed +IRF/SRF -- SRF overlying low lying PED  - exam shows +CNVM with surrounding heme -- improved today  - suspect exudative ARMD component complicating HRVO w/ CME  - S/p IVA OD #1 09.14.20, #2 (10.20.20), #3 (12.02.20)  - recommend IVA OD #4 as above  - f/u in 4 weeks  4. Age related macular degeneration, non-exudative, OS  - The incidence, anatomy, and pathology of dry AMD, risk of progression, and the AREDS and AREDS 2 study including smoking risks discussed with patient.  - Recommend Amsler grid monitoring  5,6. Hypertensive retinopathy OU  - discussed importance of tight BP control  - monitor  7. POAG OU -- severe stage  - under the expert management of Dr. Midge Aver  - s/p SLT OD 3.23.2017  - IOP elevated today at 24 OD, OS okay at 12 -- pt ran  out of Rhopressa last week  - AC tap performed post IVA OD today -- IOP improved to 8  - Latanoprost QHS OD  - Dorzolamide-Timolol BID OU  - Rhopressa QHS OD  8. Pseudophakia OU  - s/p CE/IOL OU  - beautiful surgeries, doing well  - monitor   Ophthalmic Meds Ordered this visit:  Meds ordered this encounter  Medications  . Bevacizumab (AVASTIN) SOLN 1.25 mg       Return in about 4 weeks (around 11/13/2019) for f/u HRVO with CME OD, DFE, OCT.  There are no Patient Instructions on file for this visit.   Explained the diagnoses, plan, and follow up with the patient and they expressed understanding.  Patient expressed understanding of the importance of proper follow up care.   This document serves as a record of services personally performed by Gardiner Sleeper, MD, PhD. It was created on their behalf by Roselee Nova, COMT. The creation of this record is the provider's dictation and/or activities during the visit.  Electronically signed by: Roselee Nova, COMT 10/16/19 1:31 PM    Gardiner Sleeper, M.D., Ph.D. Diseases & Surgery of the Retina and [place]  I have reviewed the above documentation for accuracy and completeness, and I  agree with the above. Gardiner Sleeper, M.D., Ph.D. 10/16/19 1:31 PM    Abbreviations: M myopia (nearsighted); A astigmatism; H hyperopia (farsighted); P presbyopia; Mrx spectacle prescription;  CTL contact lenses; OD right eye; OS left eye; OU both eyes  XT exotropia; ET esotropia; PEK punctate epithelial keratitis; PEE punctate epithelial erosions; DES dry eye syndrome; MGD meibomian gland dysfunction; ATs artificial tears; PFAT's preservative free artificial tears; [place] nuclear sclerotic cataract; PSC posterior subcapsular cataract; ERM epi-retinal membrane; PVD posterior vitreous detachment; RD retinal detachment; DM diabetes mellitus; DR diabetic retinopathy; NPDR non-proliferative diabetic retinopathy; PDR proliferative  diabetic retinopathy; CSME clinically significant macular edema; DME diabetic macular edema; dbh dot blot hemorrhages; CWS cotton wool spot; POAG primary open angle glaucoma; C/D cup-to-disc ratio; HVF humphrey visual field; GVF goldmann visual field; OCT optical coherence tomography; IOP intraocular pressure; BRVO Branch retinal vein occlusion; CRVO central retinal vein occlusion; CRAO central retinal artery occlusion; BRAO branch retinal artery occlusion; RT retinal tear; SB scleral buckle; PPV pars plana vitrectomy; VH Vitreous hemorrhage; PRP panretinal laser photocoagulation; IVK intravitreal kenalog; VMT vitreomacular traction; MH Macular hole;  NVD neovascularization of the disc; NVE neovascularization elsewhere; AREDS age related eye disease study; ARMD age related macular degeneration; POAG primary open angle glaucoma; EBMD epithelial/anterior basement membrane dystrophy; ACIOL anterior chamber intraocular lens; IOL intraocular lens; PCIOL posterior chamber intraocular lens; Phaco/IOL phacoemulsification with intraocular lens placement; [place] photorefractive keratectomy; LASIK laser assisted in situ keratomileusis; HTN hypertension; DM diabetes mellitus; COPD chronic obstructive pulmonary disease

## 2019-10-16 ENCOUNTER — Ambulatory Visit (INDEPENDENT_AMBULATORY_CARE_PROVIDER_SITE_OTHER): Payer: Medicare Other | Admitting: Ophthalmology

## 2019-10-16 ENCOUNTER — Encounter (INDEPENDENT_AMBULATORY_CARE_PROVIDER_SITE_OTHER): Payer: Self-pay | Admitting: Ophthalmology

## 2019-10-16 ENCOUNTER — Other Ambulatory Visit: Payer: Self-pay

## 2019-10-16 DIAGNOSIS — H353122 Nonexudative age-related macular degeneration, left eye, intermediate dry stage: Secondary | ICD-10-CM

## 2019-10-16 DIAGNOSIS — I10 Essential (primary) hypertension: Secondary | ICD-10-CM

## 2019-10-16 DIAGNOSIS — H34831 Tributary (branch) retinal vein occlusion, right eye, with macular edema: Secondary | ICD-10-CM

## 2019-10-16 DIAGNOSIS — H353211 Exudative age-related macular degeneration, right eye, with active choroidal neovascularization: Secondary | ICD-10-CM

## 2019-10-16 DIAGNOSIS — H3581 Retinal edema: Secondary | ICD-10-CM | POA: Diagnosis not present

## 2019-10-16 DIAGNOSIS — H35033 Hypertensive retinopathy, bilateral: Secondary | ICD-10-CM

## 2019-10-16 DIAGNOSIS — H401133 Primary open-angle glaucoma, bilateral, severe stage: Secondary | ICD-10-CM

## 2019-10-16 DIAGNOSIS — Z961 Presence of intraocular lens: Secondary | ICD-10-CM

## 2019-10-16 MED ORDER — BEVACIZUMAB CHEMO INJECTION 1.25MG/0.05ML SYRINGE FOR KALEIDOSCOPE
1.2500 mg | INTRAVITREAL | Status: AC | PRN
  Administered 2019-10-16: 1.25 mg via INTRAVITREAL

## 2019-10-17 NOTE — Progress Notes (Signed)
Subjective:   [name] presents for continued care of ulceration of plantar aspect right foot.  Patient's daughter is present during the visit. Daughter states Mom has been complaining of painful lesion on bunion area right foot where she has a hard area of skin. Mom is also complaining about plantar aspect of right foot. They deny any drainage from either area.  Patient denies any fever, chills, nightsweats, nausea or vomiting.  She has also picked up her shoes from Music therapist. Daughter states Mom does not like them, but daughter feels they should give them a chance.  Current Outpatient Medications on File Prior to Visit  Medication Sig Dispense Refill  . Acetaminophen (TYLENOL) 325 MG CAPS Take 325 mg by mouth 3 (three) times daily as needed.    Marland Kitchen aspirin 81 MG chewable tablet Chew 81 mg by mouth daily.    Marland Kitchen atorvastatin (LIPITOR) 80 MG tablet Take 1 tablet (80 mg total) by mouth daily at 6 PM. 60 tablet 0  . bimatoprost (LUMIGAN) 0.01 % SOLN Place 1 drop into the right eye at bedtime.    . brimonidine (ALPHAGAN) 0.2 % ophthalmic solution brimonidine 0.2 % eye drops  INSTILL 1 DROP INTO BOTH EYES TWICE A DAY    . Calcium Carbonate-Vitamin D (CALTRATE 600+D PO) Take 1 tablet by mouth 2 (two) times daily.    . chlorpheniramine (CHLOR-TRIMETON) 4 MG tablet Take 4 mg by mouth every 4 (four) hours as needed for allergies.    Marland Kitchen diclofenac sodium (VOLTAREN) 1 % GEL diclofenac 1 % topical gel  AS DIRECTED TWICE A DAY AS NEEDED TRANSDERMAL 14 DAYS    . dorzolamide-timolol (COSOPT) 22.3-6.8 MG/ML ophthalmic solution Place 1 drop into both eyes 2 (two) times daily.     . famotidine (PEPCID) 20 MG tablet Take 20 mg by mouth daily.    . furosemide (LASIX) 20 MG tablet furosemide 20 mg tablet  TAKE 1 TABLET BY MOUTH EVERY DAY    . latanoprost (XALATAN) 0.005 % ophthalmic solution latanoprost 0.005 % eye drops  INSTILL 1 DROP INTO RIGHT EYE AT BEDTIME    . losartan (COZAAR) 25  MG tablet Take 50 mg by mouth daily.    Marland Kitchen losartan (COZAAR) 50 MG tablet     . metoprolol (LOPRESSOR) 50 MG tablet Take 25 mg by mouth 2 (two) times daily.    . Multiple Vitamins-Minerals (MULTIPLE VITAMINS/WOMENS PO) Take 1 tablet by mouth daily. W/vitamin D    . Netarsudil Dimesylate (RHOPRESSA) 0.02 % SOLN Rhopressa 0.02 % eye drops  INSTILL 1 DROP INTO AFFECTED EYE(S) BY OPHTHALMIC ROUTE ONCE DAILY INTHE EVENING    . pantoprazole (PROTONIX) 40 MG tablet Take 1 tablet (40 mg total) by mouth daily. Take 30-60 min before first meal of the day 30 tablet 2  . Polyethyl Glycol-Propyl Glycol (SYSTANE OP) Apply 1 drop to eye 2 (two) times daily.    Marland Kitchen Zoster Vaccine Adjuvanted [facility]) injection Shingrix (PF) 50 mcg/0.5 mL intramuscular suspension, kit     No current facility-administered medications on file prior to visit.     Allergies  Allergen Reactions  . Meloxicam Hives  . Other   . Nitrofuran Derivatives Rash     Objective:   Vascular Examination:  Capillary refill time to digits <3 seconds b/l.   Dorsalis pedis pulses faintly palpable b/l.  Posterior tibial pulses diminished b/l.  Digital hair absent b/l.  Skin temperature gradient WNL b/l.   +Edema noted b/l  feet/ankles.   Dermatological Examination: Skin thin, shiny and atrophic b/l.  Toenails 1-5 b/l adequate length b/l.  She has loss of plantar fat pad under metatarsal heads.  Ulceration located plantar right foot: Predebridement measurements carried out today of 1.5 x 1.0 cm with hyperkeratotic roof and subdermal hemorrhage.  No periulcerative erythema, no edema, no drainage.  No flocculence, no malodor. No pedal warmth, no lymphangitis.  Postdebridement measurements today are: 1.5 x 1.0 x 0.1 cm and is partial thickness.  No tracking, no tunneling, no odor, no undermining. No probing to bone, no purulent drainage.  No deep abscess evident.  Right first metatarsal with conical hyperkeratotic lesion medial  aspect 1st metatarsal head with subcutaneous hemorrhage. No flocculence, no periulcerative erythema, no edema, no active drainage, no flocculence, no warmth.   Musculoskeletal: Muscle strength 5/5 to all LE muscle groups bilaterally.  Bunion deformity b/l.  Hammertoes 2-5 b/l.  Neurological: Sensation absent bilaterally with 10 gram monofilament.  Assessment:   1.  Neurotrophic Ulceration plantar right foot 2.  Preulcerative callus 1st metatarsal head right foot 3.  Peripheral neuropathy   Plan: 1. Ulcer plantar right foot was debrided to the level of healthy, viable tissue. Ulcer was cleansed with wound cleanser. Triple antibioitic ointment and band-aid was applied.  2. Right first metatarsal lesion gently filed with dremel. Area cleansed with alcohol. Bandaid applied. She has bunion shield and is to continue wearing it daily for protection. 3. Continue surgical shoe right foot. May start break-in period for new shoes. If she cannot tolerate new shoes, she is to continue surgical shoe daily. 4. Insert for right foot plantar lesion offloaded by Pedorthist, Betha.    5. Patient is to follow up with both me and Betha 3 weeks. She is to bring her shoes in should they or inserts need additional modification. 6. Patient instructed to report to emergency department with worsening appearance of ulcer/toe/foot, increased pain, foul odor, increased redness, swelling, drainage, fever, chills, nightsweats, nausea, vomiting, increased blood sugar.  7. Patient/POA related understanding.

## 2019-11-08 ENCOUNTER — Ambulatory Visit: Payer: Medicare Other

## 2019-11-08 ENCOUNTER — Other Ambulatory Visit: Payer: Medicare Other | Admitting: Orthotics

## 2019-11-08 ENCOUNTER — Other Ambulatory Visit: Payer: Self-pay

## 2019-11-08 ENCOUNTER — Encounter: Payer: Self-pay | Admitting: Podiatry

## 2019-11-08 ENCOUNTER — Ambulatory Visit: Payer: Medicare Other | Admitting: Podiatry

## 2019-11-08 DIAGNOSIS — G629 Polyneuropathy, unspecified: Secondary | ICD-10-CM

## 2019-11-08 DIAGNOSIS — L97511 Non-pressure chronic ulcer of other part of right foot limited to breakdown of skin: Secondary | ICD-10-CM | POA: Diagnosis not present

## 2019-11-08 NOTE — Patient Instructions (Signed)

## 2019-11-09 NOTE — Progress Notes (Signed)
Subjective:   [name] presents for continued care of ulceration of right foot.  Patient has been performing daily dressing changes to right. Daughter is present during the visit. Patient's bunion is painful as well. They deny any redness, drainage, or swelling.   They have returned her new diabetic shoes as they are too tight and cause pain. [name] cannot tolerate them.   Current Outpatient Medications on File Prior to Visit  Medication Sig Dispense Refill  . Acetaminophen (TYLENOL) 325 MG CAPS Take 325 mg by mouth 3 (three) times daily as needed.    Marland Kitchen amoxicillin (AMOXIL) 500 MG capsule TAKE 4 CAPSULES 1 HOUR BEFORE DENTAL APPOINTMENT    . aspirin 81 MG chewable tablet Chew 81 mg by mouth daily.    Marland Kitchen atorvastatin (LIPITOR) 80 MG tablet Take 1 tablet (80 mg total) by mouth daily at 6 PM. 60 tablet 0  . bimatoprost (LUMIGAN) 0.01 % SOLN Place 1 drop into the right eye at bedtime.    . brimonidine (ALPHAGAN) 0.2 % ophthalmic solution brimonidine 0.2 % eye drops  INSTILL 1 DROP INTO BOTH EYES TWICE A DAY    . Calcium Carbonate-Vitamin D (CALTRATE 600+D PO) Take 1 tablet by mouth 2 (two) times daily.    . chlorpheniramine (CHLOR-TRIMETON) 4 MG tablet Take 4 mg by mouth every 4 (four) hours as needed for allergies.    Marland Kitchen diclofenac sodium (VOLTAREN) 1 % GEL diclofenac 1 % topical gel  AS DIRECTED TWICE A DAY AS NEEDED TRANSDERMAL 14 DAYS    . dorzolamide-timolol (COSOPT) 22.3-6.8 MG/ML ophthalmic solution Place 1 drop into both eyes 2 (two) times daily.     . famotidine (PEPCID) 20 MG tablet Take 20 mg by mouth daily.    Marland Kitchen FLUZONE HIGH-DOSE QUADRIVALENT 0.7 ML SUSY     . furosemide (LASIX) 20 MG tablet furosemide 20 mg tablet  TAKE 1 TABLET BY MOUTH EVERY DAY    . latanoprost (XALATAN) 0.005 % ophthalmic solution latanoprost 0.005 % eye drops  INSTILL 1 DROP INTO RIGHT EYE AT BEDTIME    . losartan (COZAAR) 25 MG tablet Take 50 mg by mouth daily.    Marland Kitchen losartan (COZAAR) 50 MG tablet      . metoprolol (LOPRESSOR) 50 MG tablet Take 25 mg by mouth 2 (two) times daily.    . Multiple Vitamins-Minerals (MULTIPLE VITAMINS/WOMENS PO) Take 1 tablet by mouth daily. W/vitamin D    . Netarsudil Dimesylate (RHOPRESSA) 0.02 % SOLN Rhopressa 0.02 % eye drops  INSTILL 1 DROP INTO AFFECTED EYE(S) BY OPHTHALMIC ROUTE ONCE DAILY INTHE EVENING    . pantoprazole (PROTONIX) 40 MG tablet Take 1 tablet (40 mg total) by mouth daily. Take 30-60 min before first meal of the day 30 tablet 2  . Polyethyl Glycol-Propyl Glycol (SYSTANE OP) Apply 1 drop to eye 2 (two) times daily.    Marland Kitchen Zoster Vaccine Adjuvanted [facility]) injection Shingrix (PF) 50 mcg/0.5 mL intramuscular suspension, kit     No current facility-administered medications on file prior to visit.     Allergies  Allergen Reactions  . Meloxicam Hives  . Other   . Nitrofuran Derivatives Rash     Objective:   Vascular Examination: There were no vitals filed for this visit.  Capillary refill time <3 seconds x 10 digits.  Dorsalis pedis pulses 1/4 b/l.  Posterior tibial pulses 0/4 b/l.  Digital hair absent b/l.   Skin temperature gradient warm to cool b/l.  Bilateral lower extremity  edema b/l.   Dermatological Examination: Skin thin, shiny and atrophic b/l.  Toenails 1-5 b/l discolored, thick, dystrophic with subungual debris and pain with palpation to nailbeds due to thickness of nails.  Ulceration located plantar aspect right foot: Predebridement measurements carried out today of 1.5 x 1.0 cm.  There is centralized subdermal hemorrhage. No periulcerative erythema, no edema, no drainage, no flocculence, no malodor.  Postdebridement measurements today are: 1.5 x 1.0 x 0.1 cm.  No tracking, no tunneling, no undermining. No probing to bone, no purulent drainage.  No deep abscess evident.  Right first metatarsal head with hyperkeratosis medially. No erythema, no edema, no drainage, no flocculence.  Musculoskeletal: Muscle  strength 5/5 to all LE muscle groups bilaterally.   HAV with bunion b/l. Hammertoes 2-5 b/l.   Neurological: Sensation absent b/l bilaterally with 10 gram monofilament.  Assessment:   1. Neurotrophic ulceration right foot, noninfected 2.    NIDDM with peripheral neuropathy  Plan: 1. Discussed shoes with [name] Will reorder shoes with 100% stretchable uppers. Will call patient's daughter when shoes come in.  2. Ulcer was debrided to the level of healthy, viable tissue. Ulcer was cleansed with wound cleanser. Triple antibiotic ointment was applied to base of wound with light dressing. 3. Continue surgical shoe daily for right foot. 4. Daughter to call if any signs or symptoms of infection arise.  5. Patient is to follow up 3 weeks. 6. Patient instructed to report to emergency department with worsening appearance of ulcer/toe/foot, increased pain, foul odor, increased redness, swelling, drainage, fever, chills, nightsweats, nausea, vomiting, increased blood sugar.  7. Patient/POA related understanding.

## 2019-11-13 DIAGNOSIS — H34811 Central retinal vein occlusion, right eye, with macular edema: Secondary | ICD-10-CM | POA: Diagnosis not present

## 2019-11-13 DIAGNOSIS — H401113 Primary open-angle glaucoma, right eye, severe stage: Secondary | ICD-10-CM | POA: Diagnosis not present

## 2019-11-13 DIAGNOSIS — H40012 Open angle with borderline findings, low risk, left eye: Secondary | ICD-10-CM | POA: Diagnosis not present

## 2019-11-13 DIAGNOSIS — Z961 Presence of intraocular lens: Secondary | ICD-10-CM | POA: Diagnosis not present

## 2019-11-13 DIAGNOSIS — H04123 Dry eye syndrome of bilateral lacrimal glands: Secondary | ICD-10-CM | POA: Diagnosis not present

## 2019-11-14 ENCOUNTER — Encounter (INDEPENDENT_AMBULATORY_CARE_PROVIDER_SITE_OTHER): Payer: Medicare Other | Admitting: Ophthalmology

## 2019-11-16 ENCOUNTER — Ambulatory Visit: Payer: Medicare Other | Attending: Internal Medicine

## 2019-11-16 DIAGNOSIS — Z23 Encounter for immunization: Secondary | ICD-10-CM

## 2019-11-16 NOTE — Progress Notes (Signed)
   Covid-19 Vaccination Clinic  Name:  [name]    MRN: [id number] DOB: [birth year]  11/16/2019  [name] was observed post Covid-19 immunization for 15 minutes without incidence. She was provided with Vaccine Information Sheet and instruction to access the V-Safe system.   [name] was instructed to call 911 with any severe reactions post vaccine: Marland Kitchen Difficulty breathing  . Swelling of your face and throat  . A fast heartbeat  . A bad rash all over your body  . Dizziness and weakness    Immunizations Administered    Name Date Dose VIS Date Route   Pfizer COVID-19 Vaccine 11/16/2019  2:31 PM 0.3 mL 09/20/2019 Intramuscular   Manufacturer: [employer], [employer]   Lot: [id number]   NDC: [address]-8

## 2019-11-21 ENCOUNTER — Telehealth: Payer: Self-pay | Admitting: *Deleted

## 2019-11-21 NOTE — Telephone Encounter (Signed)
A message was left with the daughter, re: her mother's follow up visit.

## 2019-11-25 ENCOUNTER — Ambulatory Visit: Payer: Medicare Other | Admitting: Podiatry

## 2019-11-25 ENCOUNTER — Other Ambulatory Visit: Payer: Self-pay

## 2019-11-25 ENCOUNTER — Encounter: Payer: Self-pay | Admitting: Podiatry

## 2019-11-25 ENCOUNTER — Other Ambulatory Visit: Payer: Medicare Other | Admitting: Orthotics

## 2019-11-25 DIAGNOSIS — L98492 Non-pressure chronic ulcer of skin of other sites with fat layer exposed: Secondary | ICD-10-CM | POA: Diagnosis not present

## 2019-11-25 MED ORDER — AMOXICILLIN 500 MG PO CAPS: 500.0000 mg | ORAL_CAPSULE | Freq: Three times a day (TID) | ORAL | 0 refills | Status: AC

## 2019-11-25 NOTE — Patient Instructions (Signed)
$  25.00 for tube of Iodosorb

## 2019-11-26 NOTE — Progress Notes (Signed)
Subjective:   [name] presents for continued care of partial thickness ulceration of right foot.  Patient has been performing daily dressing changes of Medihoney to right foot daily. Daughter states Mom is complaining foot is more painful today. Ambulation is difficult due to pain.   She is still awaiting new diabetic shoes from Music therapist. They received phone call cancelling today's appointment for pick up due to shoes not arriving.  Current Outpatient Medications on File Prior to Visit  Medication Sig Dispense Refill  . Acetaminophen (TYLENOL) 325 MG CAPS Take 325 mg by mouth 3 (three) times daily as needed.    Marland Kitchen aspirin 81 MG chewable tablet Chew 81 mg by mouth daily.    Marland Kitchen atorvastatin (LIPITOR) 80 MG tablet Take 1 tablet (80 mg total) by mouth daily at 6 PM. 60 tablet 0  . bimatoprost (LUMIGAN) 0.01 % SOLN Place 1 drop into the right eye at bedtime.    . brimonidine (ALPHAGAN) 0.2 % ophthalmic solution brimonidine 0.2 % eye drops  INSTILL 1 DROP INTO BOTH EYES TWICE A DAY    . Calcium Carbonate-Vitamin D (CALTRATE 600+D PO) Take 1 tablet by mouth 2 (two) times daily.    . chlorpheniramine (CHLOR-TRIMETON) 4 MG tablet Take 4 mg by mouth every 4 (four) hours as needed for allergies.    Marland Kitchen diclofenac sodium (VOLTAREN) 1 % GEL diclofenac 1 % topical gel  AS DIRECTED TWICE A DAY AS NEEDED TRANSDERMAL 14 DAYS    . dorzolamide-timolol (COSOPT) 22.3-6.8 MG/ML ophthalmic solution Place 1 drop into both eyes 2 (two) times daily.     . famotidine (PEPCID) 20 MG tablet Take 20 mg by mouth daily.    Marland Kitchen FLUZONE HIGH-DOSE QUADRIVALENT 0.7 ML SUSY     . furosemide (LASIX) 20 MG tablet furosemide 20 mg tablet  TAKE 1 TABLET BY MOUTH EVERY DAY    . latanoprost (XALATAN) 0.005 % ophthalmic solution latanoprost 0.005 % eye drops  INSTILL 1 DROP INTO RIGHT EYE AT BEDTIME    . losartan (COZAAR) 25 MG tablet Take 50 mg by mouth daily.    Marland Kitchen losartan (COZAAR) 50 MG tablet     .  metoprolol (LOPRESSOR) 50 MG tablet Take 25 mg by mouth 2 (two) times daily.    . Multiple Vitamins-Minerals (MULTIPLE VITAMINS/WOMENS PO) Take 1 tablet by mouth daily. W/vitamin D    . Netarsudil Dimesylate (RHOPRESSA) 0.02 % SOLN Rhopressa 0.02 % eye drops  INSTILL 1 DROP INTO AFFECTED EYE(S) BY OPHTHALMIC ROUTE ONCE DAILY INTHE EVENING    . pantoprazole (PROTONIX) 40 MG tablet Take 1 tablet (40 mg total) by mouth daily. Take 30-60 min before first meal of the day 30 tablet 2  . Polyethyl Glycol-Propyl Glycol (SYSTANE OP) Apply 1 drop to eye 2 (two) times daily.    Marland Kitchen Zoster Vaccine Adjuvanted [facility]) injection Shingrix (PF) 50 mcg/0.5 mL intramuscular suspension, kit     No current facility-administered medications on file prior to visit.     Allergies  Allergen Reactions  . Meloxicam Hives  . Other   . Nitrofuran Derivatives Rash    Objective:   Vascular Examination: There were no vitals filed for this visit.  Capillary refill time to digits <3 seconds.  Dorsalis pedis pulses faintly palpable b/l.  Posterior tibial pulses nonpalpable b/l.  Digital hair absent x 10 digits.  Skin temperature gradient WNL b/l.   Dermatological Examination: Skin thin, shiny and atrophic b/l  Toenails 1-5 b/l  discolored, thick, dystrophic with subungual debris and pain with palpation to nailbeds due to thickness of nails.  Ulceration located plantar aspect right foot: Predebridement measurements carried out today of 1.5 x 1.5 cm with hyperkeratotic roof, but flocculence is noted beneath lesion. There is tenderness to palpation.   Postdebridement measurements today are: 0.3 x 0.6 x 0.2 cm. Mild odor resolved with wound cleanser.  No tracking, no tunneling, no undermining. No probing to bone, no purulent drainage. No deep abscess evident.  Musculoskeletal: Muscle strength 5/5 to all LE muscle groups bilaterally.  Hammertoe deformity 2-5 b/l.  HAV with bunion  b/l.  Neurological: Sensation diminished bilaterally with 10 gram monofilament.  Assessment:   1.  Neurotrophic Ulceration right foot  Plan: 1. Ulcer was debrided to the level of healthy, viable tissue. Ulcer was cleansed with wound cleanser. Iodosorb Gel was applied to base of wound with light dressing. 2. Peg Assist with offloading was applied to existing surgical shoe for right foot. Patient noted pain relief with weightbearing after Peg Assist added to shoe.  3. Rx for amoxicillin 500 mg sent to pharmacy. Take 1 capsule three times daily for one week. 4. Patient's daughter was given instructions on Iodosorb dressing changes and was instructed to call immediately if any signs or symptoms of infection arise.  5. Patient is to follow up in 2 weeks with Dr. Jacqualyn Posey for ulcer plantar right foot. 6. Patient instructed to report to emergency department with worsening appearance of ulcer/toe/foot, increased pain, foul odor, increased redness, swelling, drainage, fever, chills, nightsweats, nausea, vomiting, increased blood sugar.  7. Patient/POA related understanding.

## 2019-11-27 DIAGNOSIS — M1711 Unilateral primary osteoarthritis, right knee: Secondary | ICD-10-CM | POA: Diagnosis not present

## 2019-11-27 DIAGNOSIS — M25552 Pain in left hip: Secondary | ICD-10-CM | POA: Diagnosis not present

## 2019-12-04 ENCOUNTER — Ambulatory Visit: Payer: Medicare Other | Admitting: Podiatry

## 2019-12-04 DIAGNOSIS — M25521 Pain in right elbow: Secondary | ICD-10-CM | POA: Diagnosis not present

## 2019-12-04 DIAGNOSIS — M25522 Pain in left elbow: Secondary | ICD-10-CM | POA: Diagnosis not present

## 2019-12-09 ENCOUNTER — Other Ambulatory Visit: Payer: Self-pay

## 2019-12-09 ENCOUNTER — Ambulatory Visit: Payer: Medicare Other | Admitting: Podiatry

## 2019-12-09 DIAGNOSIS — L97511 Non-pressure chronic ulcer of other part of right foot limited to breakdown of skin: Secondary | ICD-10-CM

## 2019-12-09 DIAGNOSIS — G629 Polyneuropathy, unspecified: Secondary | ICD-10-CM

## 2019-12-11 ENCOUNTER — Ambulatory Visit: Payer: Medicare Other | Attending: Internal Medicine

## 2019-12-11 DIAGNOSIS — Z23 Encounter for immunization: Secondary | ICD-10-CM

## 2019-12-11 NOTE — Progress Notes (Signed)
   Covid-19 Vaccination Clinic  Name:  [name]    MRN: [id number] DOB: [birth year]  12/11/2019  [name] was observed post Covid-19 immunization for 15 minutes without incident. She was provided with Vaccine Information Sheet and instruction to access the V-Safe system.   [name] was instructed to call 911 with any severe reactions post vaccine: Marland Kitchen Difficulty breathing  . Swelling of face and throat  . A fast heartbeat  . A bad rash all over body  . Dizziness and weakness   Immunizations Administered    Name Date Dose VIS Date Route   Pfizer COVID-19 Vaccine 12/11/2019 10:06 AM 0.3 mL 09/20/2019 Intramuscular   Manufacturer: [employer], [employer]   Lot: [id number]   NDC: [address]-3

## 2019-12-13 ENCOUNTER — Telehealth: Payer: Self-pay

## 2019-12-13 NOTE — Telephone Encounter (Signed)
Contacted patient- advised her that we would need to move her appointment to another provider for Monday 3/8. Left call back number to discuss.

## 2019-12-15 NOTE — Progress Notes (Signed)
Subjective: 86 year old female presents the office today with her daughter for follow-up evaluation of a recurrent ulcer to the right foot.  They have been changing the wound daily with Iodosorb gel.  Overall the wound has been getting better denies any drainage or pus but is still present.  No increase in swelling or redness of the foot. Denies any systemic complaints such as fevers, chills, nausea, vomiting. No acute changes since last appointment, and no other complaints at this time.   Objective: AAO x3, NAD DP/PT pulses palpable bilaterally, CRT less than 3 seconds Prominent metatarsal heads plantarly with atrophy of the fat pad.  Hyperkeratotic lesion present submetatarsal area.  Upon debridement there is a still superficial wound present measuring 0.5 x 0.3 cm and superficial with a granular wound base.  There is no probing, undermining or tunneling.  There is no surrounding erythema, ascending cellulitis.  There is no fluctuation crepitation.  She is having discomfort to the area. No open lesions or pre-ulcerative lesions.  No pain with calf compression, swelling, warmth, erythema  Assessment: Recurrent ulceration right foot  Plan: -All treatment options discussed with the patient including all alternatives, risks, complications.  -Sharp debrided hyperkeratotic lesion, ulcer utilize #312 with scalpel without any complications or bleeding.  Would continue with daily dressing changes with Iodosorb.  Offloading.  We are awaiting new shoes, inserts.  This was more helpful.  Continue offloading pads for now as well. -Monitor for any clinical signs or symptoms of infection and directed to call the office immediately should any occur or go to the ER. -Patient encouraged to call the office with any questions, concerns, change in symptoms.   Return in about 2 weeks (around 12/23/2019).  Vivi Barrack DPM

## 2019-12-15 NOTE — Progress Notes (Signed)
Virtual Visit via Telephone Note   This visit type was conducted due to national recommendations for restrictions regarding the COVID-19 Pandemic (e.g. social distancing) in an effort to limit this patient's exposure and mitigate transmission in our community.  Due to her co-morbid illnesses, this patient is at least at moderate risk for complications without adequate follow up.  This format is felt to be most appropriate for this patient at this time.  The patient did not have access to video technology/had technical difficulties with video requiring transitioning to audio format only (telephone).  All issues noted in this document were discussed and addressed.  No physical exam could be performed with this format.  Please refer to the patient's chart for her  consent to telehealth for [facility].  Evaluation Performed:  Follow-up visit  This visit type was conducted due to national recommendations for restrictions regarding the COVID-19 Pandemic (e.g. social distancing).  This format is felt to be most appropriate for this patient at this time.  All issues noted in this document were discussed and addressed.  No physical exam was performed (except for noted visual exam findings with Video Visits).  Please refer to the patient's chart (MyChart message for video visits and phone note for telephone visits) for the patient's consent to telehealth for [facility]  Date:  12/16/2019   ID:  [name], DOB 07-17-1923, MRN [id number]  Patient Location:  [address] [place] [address]   Provider location:      [facility] [address] Office [phone] Fax [phone]   PCP:  [name], MD  Cardiologist:  No primary care provider on file.  Electrophysiologist:  None   Chief Complaint: Follow-up History of Present Illness:    [name] is a 86 y.o. female who presents via audio/video conferencing for a telehealth visit today.   Patient verified DOB and address.  The patient does not symptoms concerning for COVID-19 infection (fever, chills, cough, or new SHORTNESS OF BREATH).   [name] has a PMH of CAD, carotid Dopplers 10/16 showed no significant stenosis, she was admitted 11/16) with and for a non-STEMI.  Cardiac catheterization showed a 95% distal RCA.  She had a DES placed.  Her procedure was complicated by CVA.  An echocardiogram 11/16 showed normal LV function, G1 DD, mild mitral regurgitation, mild tricuspid regurgitation and moderate to severe pulmonary hypertension.  She was seen in the ED 1/20 with left lower extremity edema.  Lower extremity ultrasound was negative for DVT.  She was last seen by Dr. Jens Som on 12/12/2018.  At that time she denied dyspnea, chest pain, palpitations, and syncope.  She did have mild lower extremity edema, this was worse on the left.  She presents for follow-up virtually today and states she continues to do well.  She still lives independently with her daughter checking on her as needed.  Her COPD has been stable.  She has no lower extremity swelling and continues to take her furosemide as needed.  Her PCP monitors her cholesterol and lab work.  She has received her second COVID-19 vaccination and tolerated well.  I will have her follow-up in 1 year.  She denies chest pain, shortness of breath, lower extremity edema, fatigue, palpitations, melena, hematuria, hemoptysis, diaphoresis, weakness, presyncope, syncope, orthopnea, and PND.   Prior CV studies:   The following studies were reviewed today:  EKG 10/22/2018 Sinus rhythm 70 bpm  Echocardiogram 08/19/2015 Study  Conclusions   - Left ventricle: The cavity size was normal. Wall thickness was  increased in a pattern of mild LVH. Systolic function was  vigorous. The estimated ejection fraction was in the range of 65%  to 70%. Wall motion was normal; there were no regional wall  motion abnormalities. Doppler parameters  are consistent with  abnormal left ventricular relaxation (grade 1 diastolic  dysfunction).  - Mitral valve: Calcified annulus. Mildly thickened leaflets .  There was mild regurgitation.  - Tricuspid valve: There was moderate regurgitation.  - Pulmonary arteries: PA peak pressure: 60 mm Hg (S).  Past Medical History:  Diagnosis Date  . Anemia   . Arthritis   . Glaucoma    OU  . Gout, joint   . Hypertension   . Macular degeneration    OU  . NSTEMI (non-ST elevated myocardial infarction) ([place]) 08/2015  . Stroke [facility])    Past Surgical History:  Procedure Laterality Date  . ABDOMINAL HYSTERECTOMY    . BREAST SURGERY     CYST  . CARDIAC CATHETERIZATION N/A 08/18/2015   Procedure: Left Heart Cath and Coronary Angiography;  Surgeon: Troy Sine, MD;  Location: [place] [facility];  Service: Cardiovascular;  Laterality: N/A;  . CARDIAC CATHETERIZATION  08/18/2015   Procedure: Coronary Stent Intervention;  Surgeon: Troy Sine, MD;  Location: [place] [facility];  Service: Cardiovascular;;  . CATARACT EXTRACTION Bilateral   . DILATION AND CURETTAGE OF UTERUS    . EYE SURGERY Bilateral   . HERNIA REPAIR    . IRIDOTOMY / IRIDECTOMY Right   . JOINT REPLACEMENT  left knee  . REFRACTIVE SURGERY Right 2017     Current Meds  Medication Sig  . aspirin 81 MG chewable tablet Chew 81 mg by mouth daily.  Marland Kitchen atorvastatin (LIPITOR) 80 MG tablet Take 1 tablet (80 mg total) by mouth daily at 6 PM.  . bimatoprost (LUMIGAN) 0.01 % SOLN Place 1 drop into the right eye at bedtime.  . brimonidine (ALPHAGAN) 0.2 % ophthalmic solution brimonidine 0.2 % eye drops  INSTILL 1 DROP INTO BOTH EYES TWICE A DAY  . Calcium Carbonate-Vitamin D (CALTRATE 600+D PO) Take 1 tablet by mouth 2 (two) times daily.  . chlorpheniramine (CHLOR-TRIMETON) 4 MG tablet Take 4 mg by mouth every 4 (four) hours as needed for allergies.  Marland Kitchen diclofenac sodium (VOLTAREN) 1 % GEL diclofenac 1 % topical gel  AS DIRECTED  TWICE A DAY AS NEEDED TRANSDERMAL 14 DAYS  . dorzolamide-timolol (COSOPT) 22.3-6.8 MG/ML ophthalmic solution Place 1 drop into both eyes 2 (two) times daily.   . famotidine (PEPCID) 20 MG tablet Take 20 mg by mouth daily.  . furosemide (LASIX) 20 MG tablet Take 20 mg by mouth as needed.   . latanoprost (XALATAN) 0.005 % ophthalmic solution latanoprost 0.005 % eye drops  INSTILL 1 DROP INTO RIGHT EYE AT BEDTIME  . losartan (COZAAR) 50 MG tablet   . metoprolol (LOPRESSOR) 50 MG tablet Take 25 mg by mouth 2 (two) times daily.  . Multiple Vitamins-Minerals (MULTIPLE VITAMINS/WOMENS PO) Take 1 tablet by mouth daily. W/vitamin D  . Netarsudil Dimesylate (RHOPRESSA) 0.02 % SOLN Rhopressa 0.02 % eye drops  INSTILL 1 DROP INTO AFFECTED EYE(S) BY OPHTHALMIC ROUTE ONCE DAILY INTHE EVENING  . pantoprazole (PROTONIX) 40 MG tablet Take 1 tablet (40 mg total) by mouth daily. Take 30-60 min before first meal of the day  . Polyethyl Glycol-Propyl Glycol (SYSTANE OP) Apply 1 drop to eye  2 (two) times daily.     Allergies:   Meloxicam, Other, and Nitrofuran derivatives   Social History   Tobacco Use  . Smoking status: Never Smoker  . Smokeless tobacco: Never Used  Substance Use Topics  . Alcohol use: No    Alcohol/week: 0.0 standard drinks  . Drug use: No     Family Hx: The patient's family history includes Heart disease in her mother; Stroke in her mother.  ROS:   Please see the history of present illness.     All other systems reviewed and are negative.   Labs/Other Tests and Data Reviewed:    Recent Labs: No results found for requested labs within last 8760 hours.   Recent Lipid Panel Lab Results  Component Value Date/Time   CHOL 147 08/11/2017 08:44 AM   TRIG 128 08/11/2017 08:44 AM   HDL 71 08/11/2017 08:44 AM   CHOLHDL 2.1 08/11/2017 08:44 AM   CHOLHDL 2.2 06/23/2016 08:45 AM   LDLCALC 50 08/11/2017 08:44 AM    Wt Readings from Last 3 Encounters:  12/16/19 134 lb (60.8 kg)    12/12/18 151 lb (68.5 kg)  10/19/17 148 lb (67.1 kg)     Exam:    Vital Signs:  BP (!) 145/63   Pulse (!) 57   Wt 134 lb (60.8 kg)   BMI 26.17 kg/m    Well nourished, well developed female in no  acute distress.   ASSESSMENT & PLAN:    1.  Coronary artery disease-no chest pain today.Cardiac catheterization showed a 95% distal RCA.  She had a DES placed.  Her procedure was complicated by CVA. Continue aspirin 81 mg tablet daily Continue atorvastatin 80 mg tablet daily Continue metoprolol 25 mg twice daily Heart healthy low-sodium diet Increase physical activity as tolerated  Essential hypertension-BP today 145/ 63.  Continue losartan 50 mg tablet daily Continue metoprolol 25 mg twice daily Heart healthy low-sodium diet Increase physical activity as tolerated  Hyperlipidemia-LDL 50 08/11/2017 Continue atorvastatin 80 mg tablet daily Heart healthy low-sodium high-fiber diet Increase physical activity as tolerated Followed by pcp.  Lower extremity edema-euvolemic today. Continue furosemide 20 mg every other day Heart healthy low-sodium diet Lower extremity support stockings Increase physical activity as tolerated  Possible aortic stenosis-murmur on previous exam/did not appear to be severe.  Issues discussed with patient and her daughter.  Due to her age and the presence of symptoms it was felt that conservative management is the best course of treatment/plan of care at this time.  Discussed again with daughter and [name]  She continues to do well.  Disposition: Follow-up with Dr. Jens Som in 1 year.  COVID-19 Education: The signs and symptoms of COVID-19 were discussed with the patient and how to seek care for testing (follow up with PCP or arrange E-visit).  The importance of social distancing was discussed today.  Patient Risk:   After full review of this patients clinical status, I feel that they are at least moderate risk at this time.  Time:   Today, I  have spent 15 minutes with the patient with telehealth technology discussing medication, blood pressure, lower extremity edema, physical activity, breathing.     Medication Adjustments/Labs and Tests Ordered: Current medicines are reviewed at length with the patient today.  Concerns regarding medicines are outlined above.   Tests Ordered: No orders of the defined types were placed in this encounter.  Medication Changes: No orders of the defined types were placed in this encounter.  Disposition:  in 1 year(s)  Signed,   Thomasene Ripple. Ayeshia Coppin NP-C      [facility] [address] Office [phone] Fax [phone]

## 2019-12-16 ENCOUNTER — Telehealth (INDEPENDENT_AMBULATORY_CARE_PROVIDER_SITE_OTHER): Payer: Medicare Other | Admitting: General Practice

## 2019-12-16 VITALS — BP 145/63 | HR 57 | Wt 134.0 lb

## 2019-12-16 DIAGNOSIS — E7800 Pure hypercholesterolemia, unspecified: Secondary | ICD-10-CM

## 2019-12-16 DIAGNOSIS — I10 Essential (primary) hypertension: Secondary | ICD-10-CM

## 2019-12-16 DIAGNOSIS — I2510 Atherosclerotic heart disease of native coronary artery without angina pectoris: Secondary | ICD-10-CM

## 2019-12-16 DIAGNOSIS — I350 Nonrheumatic aortic (valve) stenosis: Secondary | ICD-10-CM | POA: Diagnosis not present

## 2019-12-16 DIAGNOSIS — R600 Localized edema: Secondary | ICD-10-CM

## 2019-12-16 DIAGNOSIS — Z955 Presence of coronary angioplasty implant and graft: Secondary | ICD-10-CM

## 2019-12-16 NOTE — Patient Instructions (Addendum)
Medication Instructions:  The current medical regimen is effective;  continue present plan and medications as directed. Please refer to the Current Medication list given to you today. If you need a refill on your cardiac medications before your next appointment, please call your pharmacy.  Follow-Up: 12 months Please call our office 2 months in advance, JAN 2022 to schedule this MAR 2022 appointment. In Person You may see Olga Millers, MD or one of the following Advanced Practice Providers on your designated Care Team:  Edd Fabian, FNP  Corine Shelter, PA-C  [place], [place].    At [facility], you and your health needs are our priority.  As part of our continuing mission to provide you with exceptional heart care, we have created designated Provider Care Teams.  These Care Teams include your primary Cardiologist (physician) and Advanced Practice Providers (APPs -  Physician Assistants and Nurse Practitioners) who all work together to provide you with the care you need, when you need it.  Reduce your risk of getting COVID-19 With your heart disease it is especially important for people at increased risk of severe illness from COVID-19, and those who live with them, to protect themselves from getting COVID-19. The best way to protect yourself and to help reduce the spread of the virus that causes COVID-19 is to: Marland Kitchen Limit your interactions with other people as much as possible. . Take COVID-19 when you do interact with others. If you start feeling sick and think you may have COVID-19, get in touch with your healthcare provider within 24 hours.  Thank you for choosing [facility] at [facility]!!

## 2019-12-23 ENCOUNTER — Encounter: Payer: Self-pay | Admitting: Podiatry

## 2019-12-23 ENCOUNTER — Other Ambulatory Visit: Payer: Self-pay

## 2019-12-23 ENCOUNTER — Ambulatory Visit: Payer: Medicare Other | Admitting: Podiatry

## 2019-12-23 VITALS — Temp 97.7°F

## 2019-12-23 DIAGNOSIS — M79674 Pain in right toe(s): Secondary | ICD-10-CM | POA: Diagnosis not present

## 2019-12-23 DIAGNOSIS — L97511 Non-pressure chronic ulcer of other part of right foot limited to breakdown of skin: Secondary | ICD-10-CM | POA: Diagnosis not present

## 2019-12-23 DIAGNOSIS — M79675 Pain in left toe(s): Secondary | ICD-10-CM

## 2019-12-23 DIAGNOSIS — G629 Polyneuropathy, unspecified: Secondary | ICD-10-CM

## 2019-12-23 DIAGNOSIS — B351 Tinea unguium: Secondary | ICD-10-CM

## 2019-12-23 NOTE — Progress Notes (Signed)
Subjective: 86 year old female presents the office today with her daughter for follow-up evaluation of a recurrent ulcer to the right foot.  She has been using iodosorb on the wound daily.  The drainage has improved and only describes bloody drainage.  No pus.  No increase in swelling or redness.  She still gets discomfort of the area.  Also asking for the nails be trimmed today's are thickened elongated she cannot do them herself.  She is awaiting diabetic shoes. Denies any systemic complaints such as fevers, chills, nausea, vomiting. No acute changes since last appointment, and no other complaints at this time.   Objective: AAO x3, NAD DP/PT pulses palpable bilaterally, CRT less than 3 seconds Prominent metatarsal heads plantarly with atrophy of the fat pad.  Hyperkeratotic lesion present submetatarsal area.  After debridement the wound measures 0.4 x 0.3 cm and superficial.  There is no drainage or pus there is no probing, undermining or tunneling.  Hyperkeratotic periwound. Nails are hypertrophic, dystrophic, brittle, discolored, elongated 10. No surrounding redness or drainage. Tenderness nails 1-5 bilaterally.  No open lesions or pre-ulcerative lesions.  No pain with calf compression, swelling, warmth, erythema  Assessment: Recurrent ulceration right foot; symptomatic onychomycosis   Plan: -All treatment options discussed with the patient including all alternatives, risks, complications.  -Sharp debrided hyperkeratotic lesion, ulcer utilize #312 with scalpel without any complications or bleeding.  We are going to continue with iodosorb dressing daily.  Continue offloading.  Awaiting diabetic shoes. Monitor for any clinical signs or symptoms of infection and directed to call the office immediately should any occur or go to the ER. -Nails debrided x10 without any complications or bleeding  Return in about 2 weeks (around 01/06/2020).  Vivi Barrack DPM

## 2019-12-30 DIAGNOSIS — Z961 Presence of intraocular lens: Secondary | ICD-10-CM | POA: Diagnosis not present

## 2019-12-30 DIAGNOSIS — H348312 Tributary (branch) retinal vein occlusion, right eye, stable: Secondary | ICD-10-CM | POA: Diagnosis not present

## 2019-12-30 DIAGNOSIS — H401434 Capsular glaucoma with pseudoexfoliation of lens, bilateral, indeterminate stage: Secondary | ICD-10-CM | POA: Diagnosis not present

## 2020-01-06 ENCOUNTER — Other Ambulatory Visit: Payer: Self-pay

## 2020-01-06 ENCOUNTER — Ambulatory Visit: Payer: Medicare Other | Admitting: Podiatry

## 2020-01-06 ENCOUNTER — Encounter: Payer: Self-pay | Admitting: Podiatry

## 2020-01-06 DIAGNOSIS — M79671 Pain in right foot: Secondary | ICD-10-CM

## 2020-01-06 DIAGNOSIS — G629 Polyneuropathy, unspecified: Secondary | ICD-10-CM | POA: Diagnosis not present

## 2020-01-06 DIAGNOSIS — L84 Corns and callosities: Secondary | ICD-10-CM

## 2020-01-06 DIAGNOSIS — H401413 Capsular glaucoma with pseudoexfoliation of lens, right eye, severe stage: Secondary | ICD-10-CM | POA: Insufficient documentation

## 2020-01-06 NOTE — Progress Notes (Signed)
Subjective: 86 year old female presents the office today with her daughter for follow-up evaluation of a recurrent ulcer to the right foot.  There is no obvious uremic letter but a callus has reformed.  Discussed discomfort.  She still wearing her shoes.  Denies increase in swelling or redness no drainage or pus.Denies any systemic complaints such as fevers, chills, nausea, vomiting. No acute changes since last appointment, and no other complaints at this time.   Objective: AAO x3, NAD DP/PT pulses palpable bilaterally, CRT less than 3 seconds There is prominence of the metatarsal heads plantarly with atrophy of the fat pad.  On the area submetatarsal on the right foot hyperkeratotic lesions present with dried blood and upon debridement there is no underlying ulceration identified tenderness no drainage or pus or any signs of infection. Bunion deformities present resulting in a preulcerative area. No open lesions or pre-ulcerative lesions.  No pain with calf compression, swelling, warmth, erythema  Assessment: Recurrent ulceration right foot which is healed with preulcerative callus; bunion deformity  Plan: -All treatment options discussed with the patient including all alternatives, risks, complications.  -Debrided hyperkeratotic lesions of any complications or bleeding.  Area appears to be healed.  Continue with bandaging as well as offloading. -Continue offloading for the bunion. -Rick dispensed new shoes today Break-in instructions discussed.   Return in about 3 weeks (around 01/27/2020).  Vivi Barrack DPM

## 2020-01-07 DIAGNOSIS — Z79899 Other long term (current) drug therapy: Secondary | ICD-10-CM | POA: Diagnosis not present

## 2020-01-07 DIAGNOSIS — H401413 Capsular glaucoma with pseudoexfoliation of lens, right eye, severe stage: Secondary | ICD-10-CM | POA: Diagnosis not present

## 2020-01-07 DIAGNOSIS — H401113 Primary open-angle glaucoma, right eye, severe stage: Secondary | ICD-10-CM | POA: Diagnosis not present

## 2020-01-07 DIAGNOSIS — Z8673 Personal history of transient ischemic attack (TIA), and cerebral infarction without residual deficits: Secondary | ICD-10-CM | POA: Diagnosis not present

## 2020-01-07 DIAGNOSIS — I252 Old myocardial infarction: Secondary | ICD-10-CM | POA: Diagnosis not present

## 2020-01-07 DIAGNOSIS — J449 Chronic obstructive pulmonary disease, unspecified: Secondary | ICD-10-CM | POA: Diagnosis not present

## 2020-01-07 DIAGNOSIS — I509 Heart failure, unspecified: Secondary | ICD-10-CM | POA: Diagnosis not present

## 2020-01-07 DIAGNOSIS — G4730 Sleep apnea, unspecified: Secondary | ICD-10-CM | POA: Diagnosis not present

## 2020-01-07 DIAGNOSIS — I110 Hypertensive heart disease with heart failure: Secondary | ICD-10-CM | POA: Diagnosis not present

## 2020-01-27 ENCOUNTER — Other Ambulatory Visit: Payer: Self-pay

## 2020-01-27 ENCOUNTER — Ambulatory Visit: Payer: Medicare Other | Admitting: Podiatry

## 2020-01-27 DIAGNOSIS — L84 Corns and callosities: Secondary | ICD-10-CM | POA: Diagnosis not present

## 2020-01-27 DIAGNOSIS — G629 Polyneuropathy, unspecified: Secondary | ICD-10-CM | POA: Diagnosis not present

## 2020-01-27 DIAGNOSIS — M79671 Pain in right foot: Secondary | ICD-10-CM

## 2020-01-27 DIAGNOSIS — M21619 Bunion of unspecified foot: Secondary | ICD-10-CM

## 2020-01-28 NOTE — Progress Notes (Signed)
Subjective: 86 year old female presents the office today with her daughter for follow-up evaluation of preulcerative callus of the right foot.  The wound is healed but the calluses still painful.  She also gets a painful callus on the area of the bunion.  She did get new shoes but the do not fit and she brings them back to see [name] today.  Denies any open sores or any redness or drainage. Denies any systemic complaints such as fevers, chills, nausea, vomiting. No acute changes since last appointment, and no other complaints at this time.   Objective: AAO x3, NAD DP/PT pulses palpable bilaterally, CRT less than 3 seconds There is prominence of the metatarsal heads plantarly with atrophy of the fat pad.  Submetatarsal is a hyperkeratotic lesion.  Upon debridement there is no underlying ulceration triggering signs of infection.  Hyperkeratotic tissue on the medial aspect of the right first MPJ.  Upon debridement no underlying ulceration identified to this area either.  There is mild erythema but this is a marked rub inside shoes there is no increase in warmth or ascending cellulitis.  No signs of infection. Bunion deformities present resulting in a preulcerative area. No open lesions or pre-ulcerative lesions.  No pain with calf compression, swelling, warmth, erythema  Assessment: Recurrent ulceration right foot which is healed with preulcerative callus; bunion deformity  Plan: -All treatment options discussed with the patient including all alternatives, risks, complications.  -Debrided hyperkeratotic lesions of any complications or bleeding.  Area appears to be healed.  Continue with bandaging as well as offloading. -Continue offloading for the bunion.  Dispensed a gel offloading pad. -She is to work today for the shoes.  Return in about 3 weeks (around 02/17/2020).  Vivi Barrack DPM

## 2020-02-20 ENCOUNTER — Encounter: Payer: Self-pay | Admitting: Podiatry

## 2020-02-20 ENCOUNTER — Ambulatory Visit: Payer: Medicare Other | Admitting: Orthotics

## 2020-02-20 ENCOUNTER — Ambulatory Visit: Payer: Medicare Other | Admitting: Podiatry

## 2020-02-20 ENCOUNTER — Other Ambulatory Visit: Payer: Self-pay

## 2020-02-20 DIAGNOSIS — G629 Polyneuropathy, unspecified: Secondary | ICD-10-CM | POA: Diagnosis not present

## 2020-02-20 DIAGNOSIS — L97511 Non-pressure chronic ulcer of other part of right foot limited to breakdown of skin: Secondary | ICD-10-CM

## 2020-02-20 DIAGNOSIS — L02611 Cutaneous abscess of right foot: Secondary | ICD-10-CM

## 2020-02-20 DIAGNOSIS — L84 Corns and callosities: Secondary | ICD-10-CM

## 2020-02-20 MED ORDER — DOXYCYCLINE HYCLATE 100 MG PO TABS: 100.0000 mg | ORAL_TABLET | Freq: Two times a day (BID) | ORAL | 0 refills | Status: DC

## 2020-02-20 NOTE — Progress Notes (Signed)
Subjective: 86 year old female presents the office today with her daughter for follow-up evaluation of preulcerative callus of the right foot.  States that the area has become hard again causing discomfort patient also been getting some pain in the arch of her foot but denies any increase in swelling or redness.  Is not had any drainage or pus coming from the callus sites. Denies any open sores or any redness or drainage. Denies any systemic complaints such as fevers, chills, nausea, vomiting. No acute changes since last appointment, and no other complaints at this time.   Objective: AAO x3, NAD DP/PT pulses palpable bilaterally, CRT less than 3 seconds There is prominence of the metatarsal heads plantarly with atrophy of the fat pad resulting in a hyperkeratotic lesion submetatarsal.  Upon debridement small amount of superficial purulence is identified with superficial wound present with a granular base.  There is no probing, undermining or tunneling.  There is no surrounding erythema, ascending cellulitis there is no fluctuation crepitation.  There is no malodor.  Mild discomfort of the medial band plantar fashion the arch of the foot there is no edema, erythema to this area there is no open lesions.  Significant hammertoe contractures with bunion deformity. Bunion deformities present resulting in a preulcerative area. No open lesions or pre-ulcerative lesions.  No pain with calf compression, swelling, warmth, erythema  Assessment: Superficial abscess with ulceration right foot; arch pain  Plan: -All treatment options discussed with the patient including all alternatives, risks, complications.  -Debrided hyperkeratotic lesion to the any complications to reveal a small amount of superficial purulence.  After debridement granular wound was present there is no signs of cellulitis.  Doxycycline prescribed.  Antibiotic ointment dressing changes daily.  Offloading at all times. -I think the arch pain is  coming more from the surgical shoes she is wearing.  No signs of infection going proximally or any swelling. -Following up with [name] today again about her shoes to modify them.   Vivi Barrack DPM

## 2020-02-20 NOTE — Progress Notes (Signed)
Adjusted custom shoes by removing base layer in shoe; this allowed more space to take pressure off dorsum of foot.

## 2020-02-27 DIAGNOSIS — M1711 Unilateral primary osteoarthritis, right knee: Secondary | ICD-10-CM | POA: Diagnosis not present

## 2020-02-28 ENCOUNTER — Encounter: Payer: Self-pay | Admitting: Podiatry

## 2020-02-28 ENCOUNTER — Ambulatory Visit: Payer: Medicare Other | Admitting: Podiatry

## 2020-02-28 ENCOUNTER — Other Ambulatory Visit: Payer: Self-pay

## 2020-02-28 VITALS — Temp 96.3°F

## 2020-02-28 DIAGNOSIS — L98492 Non-pressure chronic ulcer of skin of other sites with fat layer exposed: Secondary | ICD-10-CM

## 2020-02-28 NOTE — Progress Notes (Signed)
Subjective:  Patient ID: [name], female    DOB: [birth year],  MRN: [id number]  86 y.o. female is seen for follow up ulcer submet head 4 right foot.  Wound Care regimen: Daughter has been applying triple antibiotic ointment daily. Dr. Jacqualyn Posey prescribed a 10 day course of doxycycline 100 mg twice daily.  Hx confirmed with daughter.  Wound location: submet head 5 right foot.  Patient denies fever, chills, night sweats, nausea, or vomiting.  Past Medical History:  Diagnosis Date  . Anemia   . Arthritis   . Glaucoma    OU  . Gout, joint   . Hypertension   . Macular degeneration    OU  . NSTEMI (non-ST elevated myocardial infarction) ([place]) 08/2015  . Stroke [facility])      Past Surgical History:  Procedure Laterality Date  . ABDOMINAL HYSTERECTOMY    . BREAST SURGERY     CYST  . CARDIAC CATHETERIZATION N/A 08/18/2015   Procedure: Left Heart Cath and Coronary Angiography;  Surgeon: Troy Sine, MD;  Location: [facility];  Service: Cardiovascular;  Laterality: N/A;  . CARDIAC CATHETERIZATION  08/18/2015   Procedure: Coronary Stent Intervention;  Surgeon: Troy Sine, MD;  Location: [facility];  Service: Cardiovascular;;  . CATARACT EXTRACTION Bilateral   . DILATION AND CURETTAGE OF UTERUS    . EYE SURGERY Bilateral   . HERNIA REPAIR    . IRIDOTOMY / IRIDECTOMY Right   . JOINT REPLACEMENT  left knee  . REFRACTIVE SURGERY Right 2017     Current Outpatient Medications on File Prior to Visit  Medication Sig Dispense Refill  . atropine 1 % ophthalmic solution Place one drop into the right eye 2 (two) times daily for 14 days. Do not take tonight.  [name] will evaluate your post-operative status tomorrow and may make changes to the duration and dose of this medication.    Marland Kitchen ketorolac (ACULAR) 0.5 % ophthalmic solution Place one drop into the right eye 4 (four) times daily for 28 days.    Marland Kitchen neomycin-polymyxin-dexameth (MAXITROL) 0.1 % OINT Do not use tonight!    Apply a strip of ointment the size of a grain of rice to RIGHT eye at bedtime. This must be the last eye medicine of the day.   [name] will evaluate your post-operative status tomorrow and may make changes to the duration and dose of this medication.    . Acetaminophen (TYLENOL) 325 MG CAPS Take 325 mg by mouth 3 (three) times daily as needed.    Marland Kitchen aspirin 81 MG chewable tablet Chew 81 mg by mouth daily.    Marland Kitchen atorvastatin (LIPITOR) 80 MG tablet Take 1 tablet (80 mg total) by mouth daily at 6 PM. 60 tablet 0  . bimatoprost (LUMIGAN) 0.01 % SOLN Place 1 drop into the right eye at bedtime.    . brimonidine (ALPHAGAN) 0.2 % ophthalmic solution brimonidine 0.2 % eye drops  INSTILL 1 DROP INTO BOTH EYES TWICE A DAY    . Calcium Carbonate-Vitamin D (CALTRATE 600+D PO) Take 1 tablet by mouth 2 (two) times daily.    . chlorpheniramine (CHLOR-TRIMETON) 4 MG tablet Take 4 mg by mouth every 4 (four) hours as needed for allergies.    Marland Kitchen diclofenac sodium (VOLTAREN) 1 % GEL diclofenac 1 % topical gel  AS DIRECTED TWICE A DAY AS NEEDED TRANSDERMAL 14 DAYS    . dorzolamide-timolol (COSOPT) 22.3-6.8 MG/ML ophthalmic solution Place 1 drop into both eyes  2 (two) times daily.     Marland Kitchen doxycycline (VIBRA-TABS) 100 MG tablet Take 1 tablet (100 mg total) by mouth 2 (two) times daily. 20 tablet 0  . famotidine (PEPCID) 20 MG tablet Take 20 mg by mouth daily.    Marland Kitchen FLUZONE HIGH-DOSE QUADRIVALENT 0.7 ML SUSY     . furosemide (LASIX) 20 MG tablet Take 20 mg by mouth as needed.     . latanoprost (XALATAN) 0.005 % ophthalmic solution latanoprost 0.005 % eye drops  INSTILL 1 DROP INTO RIGHT EYE AT BEDTIME    . losartan (COZAAR) 25 MG tablet Take 50 mg by mouth daily.    Marland Kitchen losartan (COZAAR) 50 MG tablet     . metoprolol (LOPRESSOR) 50 MG tablet Take 25 mg by mouth 2 (two) times daily.    Marland Kitchen moxifloxacin (VIGAMOX) 0.5 % ophthalmic solution Apply 1 drop to eye 3 (three) times daily.    . Multiple Vitamins-Minerals (MULTIPLE  VITAMINS/WOMENS PO) Take 1 tablet by mouth daily. W/vitamin D    . Netarsudil Dimesylate (RHOPRESSA) 0.02 % SOLN Rhopressa 0.02 % eye drops  INSTILL 1 DROP INTO AFFECTED EYE(S) BY OPHTHALMIC ROUTE ONCE DAILY INTHE EVENING    . pantoprazole (PROTONIX) 40 MG tablet Take 1 tablet (40 mg total) by mouth daily. Take 30-60 min before first meal of the day 30 tablet 2  . Polyethyl Glycol-Propyl Glycol (SYSTANE OP) Apply 1 drop to eye 2 (two) times daily.    Marland Kitchen Zoster Vaccine Adjuvanted [facility]) injection Shingrix (PF) 50 mcg/0.5 mL intramuscular suspension, kit     No current facility-administered medications on file prior to visit.     Allergies  Allergen Reactions  . Meloxicam Hives  . Other   . Nitrofuran Derivatives Rash     Family History  Problem Relation Age of Onset  . Heart disease Mother   . Stroke Mother      Social History   Occupational History    Comment: retired  Tobacco Use  . Smoking status: Never Smoker  . Smokeless tobacco: Never Used  Substance and Sexual Activity  . Alcohol use: No    Alcohol/week: 0.0 standard drinks  . Drug use: No  . Sexual activity: Never    Birth control/protection: Post-menopausal     Immunization History  Administered Date(s) Administered  . Influenza Whole 06/10/2017  . Influenza-Unspecified 07/26/2017  . PFIZER SARS-COV-2 Vaccination 11/16/2019, 12/11/2019  . Pneumococcal Polysaccharide-23 09/28/2012     Review of systems: Positive Findings in bold print. Constitutional:  chills, fatigue, fever, sweats, weight change Communication: Optometrist, sign Ecologist, hand writing, iPad/Android device Head: head injury Eyes: changes in vision, eye pain, glaucoma, cataracts, macular degeneration, diplopia, glare, light sensitivity, eyeglasses or contacts, blindness Ears nose mouth throat: hearing impaired, hearing aids,  ringing in ears, deaf, sign language,  vertigo, nosebleeds,  rhinitis,  cold sores, snoring, swollen  glands Cardiovascular: HTN, edema, arrhythmia, pacemaker in place, defibrillator in place, chest pain/tightness, chronic anticoagulation, blood clot, heart failure, MI Peripheral Vascular: leg cramps, varicose veins, blood clots, lymphedema, varicosities Respiratory:  asthma, difficulty breathing, denies congestion, SOB, wheezing, cough, emphysema, oxygen dependent Gastrointestinal: change in appetite or weight, abdominal pain, constipation, diarrhea, nausea, vomiting, vomiting blood, change in bowel habits, abdominal pain, jaundice, rectal bleeding, hemorrhoids, GERD Genitourinary:  nocturia,  pain on urination, polyuria,  blood in urine, Foley catheter, urinary urgency, ESRD on hemodialysis Musculoskeletal: amputation(s), cramping, stiff joints, painful joints, decreased joint motion, fractures, OA, gout, hemiplegia, paraplegia, uses cane, wheelchair bound, uses  walker, uses rollator Skin: changes in toenails, color change, dryness, itching, mole changes,  rash, wound(s) Neurological: headaches, numbness in feet, paresthesias in feet, burning in feet, fainting,  seizures, change in speech. denies headaches, memory problems/poor historian, cerebral palsy, weakness, paralysis, CVA, TIA, tremors Endocrine: diabetes, hypothyroidism, hyperthyroidism,  goiter, dry mouth, flushing, heat intolerance,  cold intolerance,  excessive thirst, denies polyuria,  nocturia Hematological:  easy bleeding, excessive bleeding, easy bruising, enlarged lymph nodes, on long term blood thinner, history of past transusions Allergy/immunological:  hives, eczema, frequent infections, multiple drug allergies, seasonal allergies, transplant recipient, multiple food allergies Psychiatric:  anxiety, depression, mood disorder, suicidal ideations, hallucinations, insomnia   Objective:  Physical Exam: Vitals:   02/28/20 0806  Temp: (!) 96.3 F (35.7 C)     86 y.o. female pleasant Caucasian female in NAD. AAO x 3.   Neurovascular status unchanged b/l. Capillary fill time to digits <3 seconds b/l. Palpable DP pulses b/l. Palpable PT pulses b/l. Pedal hair absent b/l Skin temperature gradient within normal limits b/l. Nonpitting edema noted b/l LE.  Pedal skin with normal turgor, texture and tone bilaterally. No open wounds bilaterally. No interdigital macerations bilaterally. Toenails 1-5 b/l elongated, dystrophic, thickened, crumbly with subungual debris and tenderness to dorsal palpation. Hyperkeratotic lesion(s) 1st metatarsal head right foot.  No erythema, no edema, no drainage, no flocculence.      Wound Location: submet head 4 right foot. There is a minimal  devitalized hyperkeratotic tissue present.  Predebridement Wound Measurement: 2.5 x 2.5 cm Postdebridement Wound Measurement: 2.5 x 1.5 x 0.2 cm Wound Base: Mixed Granular/Fibrotic Peri-wound: Calloused Exudate: None: wound tissue dry Blood Loss during debridement: <1 cc. Description of tissue removed from ulceration today:  Hyperkeratosis and devitalized subcutaneous tissue. Signs of clinical bacterial infection are absent. Material in wound which inhibits healing/promotes adjacent tissue breakdown: Hyperkeratosis  Normal muscle strength 5/5 to all lower extremity muscle groups bilaterally. No pain crepitus or joint limitation noted with ROM b/l. Hallux valgus with bunion deformity noted b/l. Hammertoes noted to the 2-5 bilaterally.  Protective sensation diminished with 10g monofilament b/l. Proprioception intact bilaterally.  Assessment:  No diagnosis found. Plan:  Patient was evaluated and treated and all questions answered.  Patient/POA/Family member educated on diagnosis and treatment plan of routine ulcer debridement/wound care.  Type/amount of devitalized tissue removed:Hyperkeratotic and devitalized subcutaneous tissue Today's ulcer size post-debridement: 2.5 x 1.5 x 0.2 cm Wound responded well to today's debridement. Patient risk  factors affecting healing of ulcer: foot deformity, neuropathy Frequency of debridement needed to achieve healing: every 2 weeks  Return in about 2 weeks (around 03/13/2020) for right foot ulcer with Dr. Jacqualyn Posey.  Marzetta Board, DPM

## 2020-03-10 ENCOUNTER — Telehealth: Payer: Self-pay | Admitting: *Deleted

## 2020-03-10 NOTE — Telephone Encounter (Signed)
I spoke with pt's dtr, Beth and she states when she cleans the area and she is able to press thick whitish pus out like out of a pocket in the foot and then gets red drainage on the dressing. I told Waynetta Sandy I would get pt in earlier than the 03/23/2020 and transferred to schedulers.

## 2020-03-10 NOTE — Telephone Encounter (Signed)
Patient's daughter(Beth) is calling w/ concerns about the sore being treated on bottom of right foot which is leaking a red tint liquid pus,first noticed.  She has been treating w/ iodine paste and peroxide. Should she continue to treat with this or should she schedule an appointment( next appt. 06/14) to come in?

## 2020-03-14 ENCOUNTER — Encounter: Payer: Self-pay | Admitting: Podiatry

## 2020-03-14 ENCOUNTER — Other Ambulatory Visit: Payer: Self-pay

## 2020-03-14 ENCOUNTER — Ambulatory Visit: Payer: Medicare Other | Admitting: Podiatry

## 2020-03-14 DIAGNOSIS — L98492 Non-pressure chronic ulcer of skin of other sites with fat layer exposed: Secondary | ICD-10-CM | POA: Diagnosis not present

## 2020-03-14 DIAGNOSIS — L97512 Non-pressure chronic ulcer of other part of right foot with fat layer exposed: Secondary | ICD-10-CM

## 2020-03-16 DIAGNOSIS — H401421 Capsular glaucoma with pseudoexfoliation of lens, left eye, mild stage: Secondary | ICD-10-CM | POA: Diagnosis not present

## 2020-03-16 DIAGNOSIS — H401413 Capsular glaucoma with pseudoexfoliation of lens, right eye, severe stage: Secondary | ICD-10-CM | POA: Diagnosis not present

## 2020-03-16 NOTE — Progress Notes (Signed)
Subjective: 86 year old female presents the office today with daughter for follow-up evaluation of a wound on the bottom of the right foot.  She states that the wound has become somewhat worse as the last saw her and she followed up with Dr. Donzetta Matters last appointment.  There have been continuing with  iodosorb dressings daily.  Denies any drainage or pus or increase in swelling or redness.  Denies any systemic complaints such as fevers, chills, nausea, vomiting. No acute changes since last appointment, and no other complaints at this time.   Objective: AAO x3, NAD DP/PT pulses palpable bilaterally, CRT less than 3 seconds Prominence of metatarsal heads with significant bunion deformity.  Submetatarsal area in the right foot is hyperkeratotic tissue with old dried blister present and upon debridement the wound today measures 1.5 x 1.2 x 0.2 cm and prior to debridement it measured 1 x 1 x 0.1 cm and had hyperkeratotic tissue on the area.  Upon debridement there is no probing to bone, undermining or tunneling.  There is no surrounding erythema, ascending cellulitis.  No fluctuation crepitation peer there is no malodor. No open lesions or pre-ulcerative lesions.  No pain with calf compression, swelling, warmth, erythema  Assessment: Ulceration right foot  Plan: -All treatment options discussed with the patient including all alternatives, risks, complications.  --Debrided the right foot ulceration with any complications or bleeding down to healthy, granular, viable tissue utilizing the 312 with scalpel with minimal blood loss.  The wound is progressing appears to be smaller than last appointment.  Will continue with Iodosorb dressing changes and offloading at all times. -Monitor for any clinical signs or symptoms of infection and directed to call the office immediately should any occur or go to the ER. -Patient encouraged to call the office with any questions, concerns, change in symptoms.   Return in  about 1 week (around 03/21/2020).  Vivi Barrack DPM

## 2020-03-17 DIAGNOSIS — M25521 Pain in right elbow: Secondary | ICD-10-CM | POA: Insufficient documentation

## 2020-03-20 DIAGNOSIS — M25522 Pain in left elbow: Secondary | ICD-10-CM | POA: Diagnosis not present

## 2020-03-20 DIAGNOSIS — M25521 Pain in right elbow: Secondary | ICD-10-CM | POA: Diagnosis not present

## 2020-03-20 DIAGNOSIS — M25511 Pain in right shoulder: Secondary | ICD-10-CM | POA: Diagnosis not present

## 2020-03-20 DIAGNOSIS — M25512 Pain in left shoulder: Secondary | ICD-10-CM | POA: Diagnosis not present

## 2020-03-23 ENCOUNTER — Other Ambulatory Visit: Payer: Self-pay

## 2020-03-23 ENCOUNTER — Ambulatory Visit: Payer: Medicare Other | Admitting: Podiatry

## 2020-03-23 ENCOUNTER — Encounter: Payer: Self-pay | Admitting: Podiatry

## 2020-03-23 VITALS — Temp 97.5°F

## 2020-03-23 DIAGNOSIS — L97512 Non-pressure chronic ulcer of other part of right foot with fat layer exposed: Secondary | ICD-10-CM | POA: Diagnosis not present

## 2020-03-23 NOTE — Patient Instructions (Signed)
Continue iodosorb daily

## 2020-03-24 NOTE — Progress Notes (Signed)
Subjective: 86 year old female presents the office today with daughter for follow-up evaluation of a wound on the bottom of the right foot.  They have been continued Iodosorb dressings daily.  Denies any drainage or pus over the last couple of days.  No swelling or redness.  Still has some discomfort to the area.  No new concerns expressed today. Denies any systemic complaints such as fevers, chills, nausea, vomiting. No acute changes since last appointment, and no other complaints at this time.   Objective: AAO x3, NAD DP/PT pulses palpable bilaterally, CRT less than 3 seconds Prominence of metatarsal heads with significant bunion deformity.  Submetatarsal right foot is a hyperkeratotic lesion.  Upon debridement there is still a small superficial opening but appears to be much improved.  There is no surrounding erythema, ascending cellulitis.  No fluctuation crepitation.  There is no malodor.  Significant bunion deformity is present with mild erythema but no skin breakdown. No open lesions or pre-ulcerative lesions.  No pain with calf compression, swelling, warmth, erythema  Assessment: Ulceration right foot-with improvement  Plan: -All treatment options discussed with the patient including all alternatives, risks, complications.  -Debrided the right foot ulceration with any complications or bleeding down to healthy, granular tissue.  Continue with Iodosorb dressing changes daily and offloading.  Monitor closely signs or symptoms of infection. -Offloading for the bunion -Monitor for any clinical signs or symptoms of infection and directed to call the office immediately should any occur or go to the ER.  Return in about 2 weeks (around 04/06/2020).  Vivi Barrack DPM

## 2020-04-02 DIAGNOSIS — M810 Age-related osteoporosis without current pathological fracture: Secondary | ICD-10-CM | POA: Diagnosis not present

## 2020-04-09 ENCOUNTER — Other Ambulatory Visit: Payer: Self-pay

## 2020-04-09 ENCOUNTER — Ambulatory Visit: Payer: Medicare Other | Admitting: Podiatry

## 2020-04-09 DIAGNOSIS — L84 Corns and callosities: Secondary | ICD-10-CM | POA: Diagnosis not present

## 2020-04-09 DIAGNOSIS — L98492 Non-pressure chronic ulcer of skin of other sites with fat layer exposed: Secondary | ICD-10-CM

## 2020-04-10 NOTE — Progress Notes (Signed)
Subjective: 86 year old female presents the office today with daughter for follow-up evaluation of a wound on the bottom of the right foot.  She thinks the wound is doing much better.  Still some occasional discomfort but denies any drainage.  She did have some discomfort to the medial aspect of her foot a few days ago but was resolved the most part.  Denies any recent injury or falls. Denies any systemic complaints such as fevers, chills, nausea, vomiting. No acute changes since last appointment, and no other complaints at this time.   Objective: AAO x3, NAD DP/PT pulses palpable bilaterally, CRT less than 3 seconds Prominence of metatarsal heads with significant bunion deformity.  Submetatarsal right foot is a hyperkeratotic lesion and after debridement appears the wound is healed but he still a small dried blood under the callus there wound is preulcerative.  There is no surrounding erythema, ascending cellulitis.  No fluctuation or dilatation.  No malodor.  Subjectively she has some discomfort in the medial aspect of the foot but no erythema or point tenderness identified today.  Tenderness on the bunion site. No other open lesions or pre-ulcerative lesions.  No pain with calf compression, swelling, warmth, erythema  Assessment: Ulceration right foot-with improvement; resolving foot pain  Plan: -All treatment options discussed with the patient including all alternatives, risks, complications.  -Debrided hyperkeratotic tissue today with any complications.  The area is preulcerative but no definitive wound is identified.  Continue with a bandage to the area daily and small amounts of antibiotic ointment.  Continue offloading. -Right foot pain seems to be almost completely resolved.  Will monitor.  Continue offloading for the bunion.   Vivi Barrack DPM

## 2020-04-16 DIAGNOSIS — R8271 Bacteriuria: Secondary | ICD-10-CM | POA: Diagnosis not present

## 2020-04-16 DIAGNOSIS — R350 Frequency of micturition: Secondary | ICD-10-CM | POA: Diagnosis not present

## 2020-05-01 ENCOUNTER — Encounter: Payer: Self-pay | Admitting: Podiatry

## 2020-05-01 ENCOUNTER — Ambulatory Visit: Payer: Medicare Other | Admitting: Podiatry

## 2020-05-01 ENCOUNTER — Other Ambulatory Visit: Payer: Self-pay

## 2020-05-01 DIAGNOSIS — M79674 Pain in right toe(s): Secondary | ICD-10-CM | POA: Diagnosis not present

## 2020-05-01 DIAGNOSIS — Q828 Other specified congenital malformations of skin: Secondary | ICD-10-CM | POA: Diagnosis not present

## 2020-05-01 DIAGNOSIS — G629 Polyneuropathy, unspecified: Secondary | ICD-10-CM | POA: Diagnosis not present

## 2020-05-01 DIAGNOSIS — L98491 Non-pressure chronic ulcer of skin of other sites limited to breakdown of skin: Secondary | ICD-10-CM

## 2020-05-01 DIAGNOSIS — B351 Tinea unguium: Secondary | ICD-10-CM | POA: Diagnosis not present

## 2020-05-01 DIAGNOSIS — M79675 Pain in left toe(s): Secondary | ICD-10-CM | POA: Diagnosis not present

## 2020-05-01 NOTE — Progress Notes (Signed)
Subjective:  Patient ID: [name], female    DOB: 25-Nov-1922,  MRN: [id number]  86 y.o. female is seen for follow up ulcer submet head 4 right foot.    Wound Care regimen: [name] states wound has been closed for about one month.   She wears off-loaded Darco shoe at home mostly.   Hx confirmed with [name]  Wound location: submet head 4 right foot.  Patient denies fever, chills, night sweats, nausea, or vomiting.  Past Medical History:  Diagnosis Date  . Anemia   . Arthritis   . Glaucoma    OU  . Gout, joint   . Hypertension   . Macular degeneration    OU  . NSTEMI (non-ST elevated myocardial infarction) ([place]) 08/2015  . Stroke [facility])      Past Surgical History:  Procedure Laterality Date  . ABDOMINAL HYSTERECTOMY    . BREAST SURGERY     CYST  . CARDIAC CATHETERIZATION N/A 08/18/2015   Procedure: Left Heart Cath and Coronary Angiography;  Surgeon: Troy Sine, MD;  Location: [place] [facility];  Service: Cardiovascular;  Laterality: N/A;  . CARDIAC CATHETERIZATION  08/18/2015   Procedure: Coronary Stent Intervention;  Surgeon: Troy Sine, MD;  Location: [place] [facility];  Service: Cardiovascular;;  . CATARACT EXTRACTION Bilateral   . DILATION AND CURETTAGE OF UTERUS    . EYE SURGERY Bilateral   . HERNIA REPAIR    . IRIDOTOMY / IRIDECTOMY Right   . JOINT REPLACEMENT  left knee  . REFRACTIVE SURGERY Right 2017     Current Outpatient Medications on File Prior to Visit  Medication Sig Dispense Refill  . Acetaminophen (TYLENOL) 325 MG CAPS Take 325 mg by mouth 3 (three) times daily as needed.    Marland Kitchen aspirin 81 MG chewable tablet Chew 81 mg by mouth daily.    Marland Kitchen atorvastatin (LIPITOR) 80 MG tablet Take 1 tablet (80 mg total) by mouth daily at 6 PM. 60 tablet 0  . atropine 1 % ophthalmic solution Place one drop into the right eye 2 (two) times daily for 14 days. Do not take tonight.  [name] will evaluate your post-operative status tomorrow and may make  changes to the duration and dose of this medication.    . bimatoprost (LUMIGAN) 0.01 % SOLN Place 1 drop into the right eye at bedtime.    . brimonidine (ALPHAGAN) 0.2 % ophthalmic solution brimonidine 0.2 % eye drops  INSTILL 1 DROP INTO BOTH EYES TWICE A DAY    . Calcium Carbonate-Vitamin D (CALTRATE 600+D PO) Take 1 tablet by mouth 2 (two) times daily.    . cephALEXin (KEFLEX) 500 MG capsule Take 500 mg by mouth 2 (two) times daily.    . chlorpheniramine (CHLOR-TRIMETON) 4 MG tablet Take 4 mg by mouth every 4 (four) hours as needed for allergies.    Marland Kitchen diclofenac sodium (VOLTAREN) 1 % GEL diclofenac 1 % topical gel  AS DIRECTED TWICE A DAY AS NEEDED TRANSDERMAL 14 DAYS    . dorzolamide-timolol (COSOPT) 22.3-6.8 MG/ML ophthalmic solution Place 1 drop into both eyes 2 (two) times daily.     Marland Kitchen doxycycline (VIBRA-TABS) 100 MG tablet Take 1 tablet (100 mg total) by mouth 2 (two) times daily. 20 tablet 0  . famotidine (PEPCID) 20 MG tablet Take 20 mg by mouth daily.    Marland Kitchen FLUZONE HIGH-DOSE QUADRIVALENT 0.7 ML SUSY     . furosemide (LASIX) 20 MG tablet Take 20 mg by mouth as  needed.     Marland Kitchen ketorolac (ACULAR) 0.5 % ophthalmic solution Place one drop into the right eye 4 (four) times daily for 28 days.    Marland Kitchen latanoprost (XALATAN) 0.005 % ophthalmic solution latanoprost 0.005 % eye drops  INSTILL 1 DROP INTO RIGHT EYE AT BEDTIME    . losartan (COZAAR) 25 MG tablet Take 50 mg by mouth daily.    Marland Kitchen losartan (COZAAR) 50 MG tablet     . metoprolol (LOPRESSOR) 50 MG tablet Take 25 mg by mouth 2 (two) times daily.    Marland Kitchen moxifloxacin (VIGAMOX) 0.5 % ophthalmic solution Apply 1 drop to eye 3 (three) times daily.    . Multiple Vitamins-Minerals (MULTIPLE VITAMINS/WOMENS PO) Take 1 tablet by mouth daily. W/vitamin D    . neomycin-polymyxin-dexameth (MAXITROL) 0.1 % OINT Do not use tonight!   Apply a strip of ointment the size of a grain of rice to RIGHT eye at bedtime. This must be the last eye medicine of the  day.   [name] will evaluate your post-operative status tomorrow and may make changes to the duration and dose of this medication.    Mckinley Jewel Dimesylate (RHOPRESSA) 0.02 % SOLN Rhopressa 0.02 % eye drops  INSTILL 1 DROP INTO AFFECTED EYE(S) BY OPHTHALMIC ROUTE ONCE DAILY INTHE EVENING    . pantoprazole (PROTONIX) 40 MG tablet Take 1 tablet (40 mg total) by mouth daily. Take 30-60 min before first meal of the day 30 tablet 2  . Polyethyl Glycol-Propyl Glycol (SYSTANE OP) Apply 1 drop to eye 2 (two) times daily.    Marland Kitchen Zoster Vaccine Adjuvanted [facility]) injection Shingrix (PF) 50 mcg/0.5 mL intramuscular suspension, kit     No current facility-administered medications on file prior to visit.     Allergies  Allergen Reactions  . Meloxicam Hives  . Other   . Nitrofuran Derivatives Rash     Family History  Problem Relation Age of Onset  . Heart disease Mother   . Stroke Mother      Social History   Occupational History    Comment: retired  Tobacco Use  . Smoking status: Never Smoker  . Smokeless tobacco: Never Used  Substance and Sexual Activity  . Alcohol use: No    Alcohol/week: 0.0 standard drinks  . Drug use: No  . Sexual activity: Never    Birth control/protection: Post-menopausal     Immunization History  Administered Date(s) Administered  . Influenza Whole 06/10/2017  . Influenza-Unspecified 07/26/2017  . PFIZER SARS-COV-2 Vaccination 11/16/2019, 12/11/2019  . Pneumococcal Polysaccharide-23 09/28/2012   Objective:  Physical Exam: There were no vitals filed for this visit.   86 y.o. female pleasant Caucasian female in NAD. AAO x 3.  Neurovascular status unchanged b/l. Capillary fill time to digits <3 seconds b/l. Palpable DP pulses b/l. Palpable PT pulses b/l. Pedal hair absent b/l Skin temperature gradient within normal limits b/l. Nonpitting edema noted b/l LE.  Pedal skin with normal turgor, texture and tone bilaterally. No open wounds bilaterally. No  interdigital macerations bilaterally. Toenails 1-5 b/l elongated, discolored, dystrophic, thickened, crumbly with subungual debris and tenderness to dorsal palpation. Porokeratotic lesion(s) 1st metatarsal head right foot medially. No erythema, no edema, no drainage, no flocculence.  Wound Location: submet head 4 right foot. There is a minimal  devitalized hyperkeratotic tissue present.   Predebridement Wound Measurement: 0.3  x 0.5 cm with subdermal hemorrhage Postdebridement Wound Measurement: 0.5 x  0.5 x 0.1 cm Wound Base: Mixed Granular/Fibrotic Peri-wound: Calloused Exudate: None:  wound tissue dry Blood Loss during debridement: 0 cc's. Description of tissue removed from ulceration today:  Hyperkeratosis and devitalized subcutaneous tissue. Signs of clinical bacterial infection are absent. Material in wound which inhibits healing/promotes adjacent tissue breakdown: Hyperkeratosis  Normal muscle strength 5/5 to all lower extremity muscle groups bilaterally. No pain crepitus or joint limitation noted with ROM b/l. Hallux valgus with bunion deformity noted b/l. Hammertoes noted to the 2-5 bilaterally.  Protective sensation diminished with 10g monofilament b/l. Proprioception intact bilaterally.  Assessment:   1. Pain due to onychomycosis of toenails of both feet   2. Porokeratosis   3. Neurogenic ulcer, limited to breakdown of skin ([place])   4. Neuropathy    Plan:  Patient was evaluated and treated and all questions answered.  Toenails 1-5 b/l debrided in length and girth without iatrogenic laceration. Porokeratotic lesion enucleated without iatrogenic laceration medial 1st met head right foot. Dispensed new  foam bunion shield. Patient/POA/Family member educated on diagnosis and treatment plan of routine ulcer debridement/wound care.  Type/amount of devitalized tissue removed:Hyperkeratotic tissue. [name] to apply Neosporin to wound with band-aid daily. She is educated on signs of  infection and will contact us if any problems arise. Today's submet head 4 right foot ulcer size post-debridement: 0.5 x 0.5 x 0.1 cm partial thickness. Wound responded well to today's debridement. Patient risk factors affecting healing of ulcer: foot deformity, neuropathy Frequency of debridement needed to achieve healing: every 3 weeks.  Return in about 3 weeks (around 05/22/2020) for diabetic nail and callus trim, at risk foot care with Dr Jacqualyn Posey.  Marzetta Board, DPM

## 2020-05-14 DIAGNOSIS — R3121 Asymptomatic microscopic hematuria: Secondary | ICD-10-CM | POA: Diagnosis not present

## 2020-05-14 DIAGNOSIS — R350 Frequency of micturition: Secondary | ICD-10-CM | POA: Diagnosis not present

## 2020-05-21 ENCOUNTER — Other Ambulatory Visit: Payer: Self-pay

## 2020-05-21 ENCOUNTER — Ambulatory Visit: Payer: Medicare Other | Admitting: Podiatry

## 2020-05-21 DIAGNOSIS — L98491 Non-pressure chronic ulcer of skin of other sites limited to breakdown of skin: Secondary | ICD-10-CM

## 2020-05-24 NOTE — Progress Notes (Signed)
Subjective: 86 year old female presents the office today with daughter for follow-up evaluation of a preulcerative callus on the bottom of the right foot.  The area may be getting worse with the notes of blister around it denies any drainage or pus.  This started about 4 to 5 days ago.  Did not see any drainage.  No other concerns today. Denies any systemic complaints such as fevers, chills, nausea, vomiting. No acute changes since last appointment, and no other complaints at this time.   Objective: AAO x3, NAD DP/PT pulses palpable bilaterally, CRT less than 3 seconds Prominence of metatarsal heads with significant bunion deformity.  Hyperkeratotic lesion right foot submetatarsal area and just adjacent to this is what appears to be an old hemorrhagic blister.  I was able debride this and there is new, healthy skin present.  There is only 1 small area of superficial skin breakdown and was a pinpoint size.  There is no surrounding erythema, ascending cellulitis there is no fluctuation or crepitation.  There is no malodor. Significant bunion present with slight erythema but no skin breakdown. No pain with calf compression, swelling, warmth, erythema  Assessment: Superficial wound right foot  Plan: -All treatment options discussed with the patient including all alternatives, risks, complications.  -Debrided hyperkeratotic tissue today with any complications to reveal the underlying ulceration.  Continue a small amount of antibiotic ointment dressing changes daily.  Monitoring signs or symptoms of infection.  Continue offloading.  She is currently going to try new shoes next week.    No follow-ups on file.  Vivi Barrack DPM

## 2020-06-01 DIAGNOSIS — H401413 Capsular glaucoma with pseudoexfoliation of lens, right eye, severe stage: Secondary | ICD-10-CM | POA: Diagnosis not present

## 2020-06-01 DIAGNOSIS — H348312 Tributary (branch) retinal vein occlusion, right eye, stable: Secondary | ICD-10-CM | POA: Diagnosis not present

## 2020-06-01 DIAGNOSIS — Z961 Presence of intraocular lens: Secondary | ICD-10-CM | POA: Diagnosis not present

## 2020-06-01 DIAGNOSIS — Z9889 Other specified postprocedural states: Secondary | ICD-10-CM | POA: Diagnosis not present

## 2020-06-01 DIAGNOSIS — H401421 Capsular glaucoma with pseudoexfoliation of lens, left eye, mild stage: Secondary | ICD-10-CM | POA: Diagnosis not present

## 2020-06-03 ENCOUNTER — Encounter: Payer: Self-pay | Admitting: Podiatry

## 2020-06-03 ENCOUNTER — Ambulatory Visit (INDEPENDENT_AMBULATORY_CARE_PROVIDER_SITE_OTHER): Payer: Medicare Other | Admitting: Podiatry

## 2020-06-03 ENCOUNTER — Other Ambulatory Visit: Payer: Self-pay

## 2020-06-03 DIAGNOSIS — L98491 Non-pressure chronic ulcer of skin of other sites limited to breakdown of skin: Secondary | ICD-10-CM

## 2020-06-03 DIAGNOSIS — G629 Polyneuropathy, unspecified: Secondary | ICD-10-CM | POA: Diagnosis not present

## 2020-06-03 DIAGNOSIS — S91311A Laceration without foreign body, right foot, initial encounter: Secondary | ICD-10-CM

## 2020-06-03 NOTE — Progress Notes (Signed)
Subjective:  Patient ID: [name], female    DOB: 1922/11/06,  MRN: [id number]  86 y.o. female is seen for follow up ulcer submet head 4 right foot.  She says her wound area feels better since she saw Dr. Jacqualyn Posey on last visit.   States the lesion on her right bunion is more symptomatic on today's visit.  Wound Care regimen: Daughter has been applying Medihoney daily.  She wears off-loaded Darco shoe at home mostly and Ms. Brauner is requesting new Peg Assist insert.  Wound location: submet head 4 right foot.  Patient denies fever, chills, night sweats, nausea, or vomiting.  Past Medical History:  Diagnosis Date  . Anemia   . Arthritis   . Glaucoma    OU  . Gout, joint   . Hypertension   . Macular degeneration    OU  . NSTEMI (non-ST elevated myocardial infarction) ([place]) 08/2015  . Stroke [facility])      Past Surgical History:  Procedure Laterality Date  . ABDOMINAL HYSTERECTOMY    . BREAST SURGERY     CYST  . CARDIAC CATHETERIZATION N/A 08/18/2015   Procedure: Left Heart Cath and Coronary Angiography;  Surgeon: Troy Sine, MD;  Location: [place] CV [facility];  Service: Cardiovascular;  Laterality: N/A;  . CARDIAC CATHETERIZATION  08/18/2015   Procedure: Coronary Stent Intervention;  Surgeon: Troy Sine, MD;  Location: [place] [facility];  Service: Cardiovascular;;  . CATARACT EXTRACTION Bilateral   . DILATION AND CURETTAGE OF UTERUS    . EYE SURGERY Bilateral   . HERNIA REPAIR    . IRIDOTOMY / IRIDECTOMY Right   . JOINT REPLACEMENT  left knee  . REFRACTIVE SURGERY Right 2017     Current Outpatient Medications on File Prior to Visit  Medication Sig Dispense Refill  . prednisoLONE acetate (PRED FORTE) 1 % ophthalmic suspension 1 gtt  4 times a day for one week, then 3 times a day for one week, then 2 times a day for one week, then 1 time a day for one week, then stop right eye.    . Acetaminophen (TYLENOL) 325 MG CAPS Take 325 mg by mouth 3 (three) times daily as  needed.    Marland Kitchen aspirin 81 MG chewable tablet Chew 81 mg by mouth daily.    Marland Kitchen atorvastatin (LIPITOR) 80 MG tablet Take 1 tablet (80 mg total) by mouth daily at 6 PM. 60 tablet 0  . atropine 1 % ophthalmic solution Place one drop into the right eye 2 (two) times daily for 14 days. Do not take tonight.  [name] will evaluate your post-operative status tomorrow and may make changes to the duration and dose of this medication.    . bimatoprost (LUMIGAN) 0.01 % SOLN Place 1 drop into the right eye at bedtime.    . brimonidine (ALPHAGAN) 0.2 % ophthalmic solution brimonidine 0.2 % eye drops  INSTILL 1 DROP INTO BOTH EYES TWICE A DAY    . Calcium Carbonate-Vitamin D (CALTRATE 600+D PO) Take 1 tablet by mouth 2 (two) times daily.    . cephALEXin (KEFLEX) 500 MG capsule Take 500 mg by mouth 2 (two) times daily.    . chlorpheniramine (CHLOR-TRIMETON) 4 MG tablet Take 4 mg by mouth every 4 (four) hours as needed for allergies.    Marland Kitchen diclofenac sodium (VOLTAREN) 1 % GEL diclofenac 1 % topical gel  AS DIRECTED TWICE A DAY AS NEEDED TRANSDERMAL 14 DAYS    . dorzolamide-timolol (COSOPT) 22.3-6.8  MG/ML ophthalmic solution Place 1 drop into both eyes 2 (two) times daily.     Marland Kitchen doxycycline (VIBRA-TABS) 100 MG tablet Take 1 tablet (100 mg total) by mouth 2 (two) times daily. 20 tablet 0  . famotidine (PEPCID) 20 MG tablet Take 20 mg by mouth daily.    Marland Kitchen FLUZONE HIGH-DOSE QUADRIVALENT 0.7 ML SUSY     . furosemide (LASIX) 20 MG tablet Take 20 mg by mouth as needed.     Marland Kitchen ketorolac (ACULAR) 0.5 % ophthalmic solution Place one drop into the right eye 4 (four) times daily for 28 days.    Marland Kitchen latanoprost (XALATAN) 0.005 % ophthalmic solution latanoprost 0.005 % eye drops  INSTILL 1 DROP INTO RIGHT EYE AT BEDTIME    . losartan (COZAAR) 25 MG tablet Take 50 mg by mouth daily.    Marland Kitchen losartan (COZAAR) 50 MG tablet     . metoprolol (LOPRESSOR) 50 MG tablet Take 25 mg by mouth 2 (two) times daily.    Marland Kitchen moxifloxacin (VIGAMOX) 0.5  % ophthalmic solution Apply 1 drop to eye 3 (three) times daily.    . Multiple Vitamins-Minerals (MULTIPLE VITAMINS/WOMENS PO) Take 1 tablet by mouth daily. W/vitamin D    . MYRBETRIQ 25 MG TB24 tablet Take 25 mg by mouth daily.    Marland Kitchen neomycin-polymyxin-dexameth (MAXITROL) 0.1 % OINT Do not use tonight!   Apply a strip of ointment the size of a grain of rice to RIGHT eye at bedtime. This must be the last eye medicine of the day.   [name] will evaluate your post-operative status tomorrow and may make changes to the duration and dose of this medication.    Mckinley Jewel Dimesylate (RHOPRESSA) 0.02 % SOLN Rhopressa 0.02 % eye drops  INSTILL 1 DROP INTO AFFECTED EYE(S) BY OPHTHALMIC ROUTE ONCE DAILY INTHE EVENING    . pantoprazole (PROTONIX) 40 MG tablet Take 1 tablet (40 mg total) by mouth daily. Take 30-60 min before first meal of the day 30 tablet 2  . Polyethyl Glycol-Propyl Glycol (SYSTANE OP) Apply 1 drop to eye 2 (two) times daily.    . prednisoLONE acetate (PRED FORTE) 1 % ophthalmic suspension     . Zoster Vaccine Adjuvanted [facility]) injection Shingrix (PF) 50 mcg/0.5 mL intramuscular suspension, kit     No current facility-administered medications on file prior to visit.     Allergies  Allergen Reactions  . Meloxicam Hives  . Other   . Nitrofuran Derivatives Rash     Family History  Problem Relation Age of Onset  . Heart disease Mother   . Stroke Mother      Social History   Occupational History    Comment: retired  Tobacco Use  . Smoking status: Never Smoker  . Smokeless tobacco: Never Used  Substance and Sexual Activity  . Alcohol use: No    Alcohol/week: 0.0 standard drinks  . Drug use: No  . Sexual activity: Never    Birth control/protection: Post-menopausal     Immunization History  Administered Date(s) Administered  . Influenza Whole 06/10/2017  . Influenza-Unspecified 07/26/2017  . PFIZER SARS-COV-2 Vaccination 11/16/2019, 12/11/2019  . Pneumococcal  Polysaccharide-23 09/28/2012   Objective:  Physical Exam: There were no vitals filed for this visit.   86 y.o. female pleasant Caucasian female in NAD. AAO x 3.  Neurovascular status unchanged b/l. Capillary fill time to digits <3 seconds b/l. Palpable DP pulses b/l. Palpable PT pulses b/l. Pedal hair absent b/l Skin temperature gradient within normal  limits b/l. Nonpitting edema noted b/l LE.  Pedal skin with normal turgor, texture and tone bilaterally. No open wounds bilaterally. No interdigital macerations bilaterally. Toenails 1-5 b/l well maintained with adequate length. No erythema, no edema, no drainage, no flocculence. Porokeratotic lesion(s) 1st metatarsal head right foot medially. No erythema, no edema, no drainage, no flocculence.        Wound Location: submet head 4 right foot. There is a minimal  devitalized hyperkeratotic tissue present.   Predebridement Wound Measurement: 2.5 x 1.5 cm  with subdermal hemorrhage Postdebridement Wound Measurement: 0.3 x 0.2 cm.  Wound Base:  Epithelial Peri-wound: Calloused Exudate: None: wound tissue dry Blood Loss during debridement: 0 cc's. Description of tissue removed from ulceration today:  Hyperkeratosis and devitalized subcutaneous tissue. Signs of clinical bacterial infection are absent. Material in wound which inhibits healing/promotes adjacent tissue breakdown: Hyperkeratosis  She also has new skin tear at the sulcus near the plantar right 5th digit. No edema, no drainage, no fluctuance.  Normal muscle strength 5/5 to all lower extremity muscle groups bilaterally. No pain crepitus or joint limitation noted with ROM b/l. Hallux valgus with bunion deformity noted b/l. Hammertoes noted to the 2-5 bilaterally.  Protective sensation diminished with 10g monofilament b/l. Proprioception intact bilaterally.  Assessment:   1. Neurogenic ulcer, limited to breakdown of skin ([place])   2. Tear of skin of plantar aspect of right foot,  initial encounter   3. Neuropathy    Plan:  Patient was evaluated and treated and all questions answered.  Porokeratotic lesion enucleated without iatrogenic laceration medial 1st met head right foot. Dispensed new  foam bunion shield. Patient/POA/Family member educated on diagnosis and treatment plan of routine ulcer debridement/wound care.  Type/amount of devitalized tissue removed:Hyperkeratotic tissue. Daughter to apply Medihoney to wound with band-aid daily. She is educated on signs of infection and will contact us if any problems arise. Today's submet head 4 right foot ulcer size post-debridement: 0.4 x 0.3 x 0.1 cm partial thickness. New Peg Assist applied to Darco shoe right foot.  Wound responded well to today's debridement. Patient risk factors affecting healing of ulcer: foot deformity, neuropathy Frequency of debridement needed to achieve healing: every 2-3 weeks.  Return in 2 weeks (on 06/17/2020) for callus trim, at risk foot care.  Marzetta Board, DPM

## 2020-06-04 DIAGNOSIS — M25552 Pain in left hip: Secondary | ICD-10-CM | POA: Diagnosis not present

## 2020-06-04 DIAGNOSIS — M7062 Trochanteric bursitis, left hip: Secondary | ICD-10-CM | POA: Diagnosis not present

## 2020-06-04 DIAGNOSIS — M1711 Unilateral primary osteoarthritis, right knee: Secondary | ICD-10-CM | POA: Diagnosis not present

## 2020-06-17 DIAGNOSIS — R3121 Asymptomatic microscopic hematuria: Secondary | ICD-10-CM | POA: Diagnosis not present

## 2020-06-17 DIAGNOSIS — R8271 Bacteriuria: Secondary | ICD-10-CM | POA: Diagnosis not present

## 2020-06-22 ENCOUNTER — Other Ambulatory Visit: Payer: Self-pay

## 2020-06-22 ENCOUNTER — Encounter: Payer: Self-pay | Admitting: Podiatry

## 2020-06-22 ENCOUNTER — Ambulatory Visit (INDEPENDENT_AMBULATORY_CARE_PROVIDER_SITE_OTHER): Payer: Medicare Other | Admitting: Podiatry

## 2020-06-22 DIAGNOSIS — G629 Polyneuropathy, unspecified: Secondary | ICD-10-CM | POA: Diagnosis not present

## 2020-06-22 DIAGNOSIS — N183 Chronic kidney disease, stage 3 unspecified: Secondary | ICD-10-CM

## 2020-06-22 DIAGNOSIS — L98491 Non-pressure chronic ulcer of skin of other sites limited to breakdown of skin: Secondary | ICD-10-CM

## 2020-06-22 NOTE — Progress Notes (Addendum)
Subjective:  Patient ID: [name], female    DOB: 1923/04/02,  MRN: [id number]  86 y.o. female is seen for follow up ulcer submet head 4 right foot.  Daughter is present during today's visit and accompanies her on every visit.  [name] states her foot is tender, although the bunion area feels better on today.   Daughter states [name] is on doxycycline for UTI, but found out her Mom hasn't been taking them, so she now makes sure she is taking them.  She wears off-loaded Darco shoe at home mostly and had new Peg Assist system applied on last visit on 06/03/2020.  Wound location: submet head 4 right foot.  Patient denies fever, chills, night sweats, nausea, or vomiting.  Past Medical History:  Diagnosis Date  . Anemia   . Arthritis   . Glaucoma    OU  . Gout, joint   . Hypertension   . Macular degeneration    OU  . NSTEMI (non-ST elevated myocardial infarction) ([place]) 08/2015  . Stroke [facility])      Past Surgical History:  Procedure Laterality Date  . ABDOMINAL HYSTERECTOMY    . BREAST SURGERY     CYST  . CARDIAC CATHETERIZATION N/A 08/18/2015   Procedure: Left Heart Cath and Coronary Angiography;  Surgeon: Troy Sine, MD;  Location: [facility];  Service: Cardiovascular;  Laterality: N/A;  . CARDIAC CATHETERIZATION  08/18/2015   Procedure: Coronary Stent Intervention;  Surgeon: Troy Sine, MD;  Location: [facility];  Service: Cardiovascular;;  . CATARACT EXTRACTION Bilateral   . DILATION AND CURETTAGE OF UTERUS    . EYE SURGERY Bilateral   . HERNIA REPAIR    . IRIDOTOMY / IRIDECTOMY Right   . JOINT REPLACEMENT  left knee  . REFRACTIVE SURGERY Right 2017     Current Outpatient Medications on File Prior to Visit  Medication Sig Dispense Refill  . Acetaminophen (TYLENOL) 325 MG CAPS Take 325 mg by mouth 3 (three) times daily as needed.    Marland Kitchen aspirin 81 MG chewable tablet Chew 81 mg by mouth daily.    Marland Kitchen atorvastatin (LIPITOR) 80 MG tablet Take 1  tablet (80 mg total) by mouth daily at 6 PM. 60 tablet 0  . atropine 1 % ophthalmic solution Place one drop into the right eye 2 (two) times daily for 14 days. Do not take tonight.  [name] will evaluate your post-operative status tomorrow and may make changes to the duration and dose of this medication.    . bimatoprost (LUMIGAN) 0.01 % SOLN Place 1 drop into the right eye at bedtime.    . brimonidine (ALPHAGAN) 0.2 % ophthalmic solution brimonidine 0.2 % eye drops  INSTILL 1 DROP INTO BOTH EYES TWICE A DAY    . Calcium Carbonate-Vitamin D (CALTRATE 600+D PO) Take 1 tablet by mouth 2 (two) times daily.    . cephALEXin (KEFLEX) 500 MG capsule Take 500 mg by mouth 2 (two) times daily.    . chlorpheniramine (CHLOR-TRIMETON) 4 MG tablet Take 4 mg by mouth every 4 (four) hours as needed for allergies.    Marland Kitchen diclofenac sodium (VOLTAREN) 1 % GEL diclofenac 1 % topical gel  AS DIRECTED TWICE A DAY AS NEEDED TRANSDERMAL 14 DAYS    . dorzolamide-timolol (COSOPT) 22.3-6.8 MG/ML ophthalmic solution Place 1 drop into both eyes 2 (two) times daily.     Marland Kitchen doxycycline (VIBRA-TABS) 100 MG tablet Take 1 tablet (100 mg total) by  mouth 2 (two) times daily. 20 tablet 0  . doxycycline (VIBRAMYCIN) 100 MG capsule Take 100 mg by mouth 2 (two) times daily.    . famotidine (PEPCID) 20 MG tablet Take 20 mg by mouth daily.    Marland Kitchen FLUZONE HIGH-DOSE QUADRIVALENT 0.7 ML SUSY     . furosemide (LASIX) 20 MG tablet Take 20 mg by mouth as needed.     Marland Kitchen ketorolac (ACULAR) 0.5 % ophthalmic solution Place one drop into the right eye 4 (four) times daily for 28 days.    Marland Kitchen latanoprost (XALATAN) 0.005 % ophthalmic solution latanoprost 0.005 % eye drops  INSTILL 1 DROP INTO RIGHT EYE AT BEDTIME    . losartan (COZAAR) 25 MG tablet Take 50 mg by mouth daily.    Marland Kitchen losartan (COZAAR) 50 MG tablet     . metoprolol (LOPRESSOR) 50 MG tablet Take 25 mg by mouth 2 (two) times daily.    Marland Kitchen moxifloxacin (VIGAMOX) 0.5 % ophthalmic solution Apply 1  drop to eye 3 (three) times daily.    . Multiple Vitamins-Minerals (MULTIPLE VITAMINS/WOMENS PO) Take 1 tablet by mouth daily. W/vitamin D    . MYRBETRIQ 25 MG TB24 tablet Take 25 mg by mouth daily.    Marland Kitchen neomycin-polymyxin-dexameth (MAXITROL) 0.1 % OINT Do not use tonight!   Apply a strip of ointment the size of a grain of rice to RIGHT eye at bedtime. This must be the last eye medicine of the day.   [name] will evaluate your post-operative status tomorrow and may make changes to the duration and dose of this medication.    Mckinley Jewel Dimesylate (RHOPRESSA) 0.02 % SOLN Rhopressa 0.02 % eye drops  INSTILL 1 DROP INTO AFFECTED EYE(S) BY OPHTHALMIC ROUTE ONCE DAILY INTHE EVENING    . pantoprazole (PROTONIX) 40 MG tablet Take 1 tablet (40 mg total) by mouth daily. Take 30-60 min before first meal of the day 30 tablet 2  . Polyethyl Glycol-Propyl Glycol (SYSTANE OP) Apply 1 drop to eye 2 (two) times daily.    . prednisoLONE acetate (PRED FORTE) 1 % ophthalmic suspension 1 gtt  4 times a day for one week, then 3 times a day for one week, then 2 times a day for one week, then 1 time a day for one week, then stop right eye.    . prednisoLONE acetate (PRED FORTE) 1 % ophthalmic suspension     . Zoster Vaccine Adjuvanted [facility]) injection Shingrix (PF) 50 mcg/0.5 mL intramuscular suspension, kit     No current facility-administered medications on file prior to visit.     Allergies  Allergen Reactions  . Meloxicam Hives  . Other   . Nitrofuran Derivatives Rash     Family History  Problem Relation Age of Onset  . Heart disease Mother   . Stroke Mother      Social History   Occupational History    Comment: retired  Tobacco Use  . Smoking status: Never Smoker  . Smokeless tobacco: Never Used  Substance and Sexual Activity  . Alcohol use: No    Alcohol/week: 0.0 standard drinks  . Drug use: No  . Sexual activity: Never    Birth control/protection: Post-menopausal      Immunization History  Administered Date(s) Administered  . Influenza Whole 06/10/2017  . Influenza-Unspecified 07/26/2017  . PFIZER SARS-COV-2 Vaccination 11/16/2019, 12/11/2019  . Pneumococcal Polysaccharide-23 09/28/2012   Objective:  Physical Exam: There were no vitals filed for this visit.   86 y.o. female  pleasant Caucasian female in NAD. AAO x 3.  Neurovascular status unchanged b/l. Capillary fill time to digits <3 seconds b/l. Palpable DP pulses b/l. Palpable PT pulses b/l. Pedal hair absent b/l Skin temperature gradient within normal limits b/l. Nonpitting edema noted b/l LE.  Pedal skin with normal turgor, texture and tone bilaterally. No interdigital macerations bilaterally. Toenails 1-5 b/l well maintained with adequate length. No erythema, no edema, no drainage, no flocculence. No erythema, no edema, no drainage, no flocculence.   Wound Location: ball of right foot There is a moderate amount of devitalized hyperkeratotic tissue present.   Predebridement Wound Measurement: 3.0 x 3.0 cm  with subdermal hemorrhage Postdebridement Wound Measurement: 0.3 x 0.2 x 0.1 cm and is partial thickness, submet head 4 right foot. 0.2 x 0.2 x 0.1 cm and is partial thickness.  Wound Base:  Epithelial Peri-wound: Calloused Exudate: None: wound tissue dry Blood Loss during debridement: 0 cc's. Description of tissue removed from ulceration today:  Hyperkeratosis and devitalized subcutaneous tissue. Signs of clinical bacterial infection are absent. Material in wound which inhibits healing/promotes adjacent tissue breakdown: Hyperkeratosis  Normal muscle strength 5/5 to all lower extremity muscle groups bilaterally. No pain crepitus or joint limitation noted with ROM b/l. Hallux valgus with bunion deformity noted b/l. Hammertoes noted to the 2-5 bilaterally.  Protective sensation diminished with 10g monofilament b/l. Proprioception intact bilaterally.  Assessment:   1. Neurogenic ulcer,  limited to breakdown of skin ([place])   2. Neuropathy   3. Stage 3 chronic kidney disease, unspecified whether stage 3a or 3b CKD    Plan:  -Patient was evaluated and treated and all questions answered.  -Porokeratotic lesion right 1st metatarsal head: continue daily use of tubefoam bunion shield. -Patient/POA/Family member educated on diagnosis and treatment plan of routine ulcer debridement/wound care.  -Type/amount of devitalized tissue removed: devitalized hyperkeratotic tissue. -Daughter is very experienced with care of the chronic ulceration. She was instructed to apply Iodosorb Gel to wounds once daily if they start to drain. If it opens and does not drain, she may apply the Medihoney. -Today's postdebridement measurements: submet head 4 right foot ulcer size post-debridement: 0.3 x 0.2 x 0.1 cm partial thickness and 0.2 x 0.2 x 0.1 cm submet head 2nd right foot. -New Peg Assist was applied to Darco shoe right foot on 06/03/2020. -Wound responded well to today's debridement. -Patient risk factors affecting healing of ulcer: foot deformity, neuropathy. -She is on doxycycline 100 mg for UTI. -Frequency of debridement needed to achieve healing: every 2-3 weeks. -Call office if condition worsens or Ms. Palencia experiences any increased redness, drainage, swelling, warmth of right foot, fever, chills, night sweats, nausea, vomiting  Return 2-3 weeks for ulcers plantar right foot.  Marzetta Board, DPM

## 2020-06-25 ENCOUNTER — Ambulatory Visit
Admission: RE | Admit: 2020-06-25 | Discharge: 2020-06-25 | Disposition: A | Discharge status: Home / Self Care | Payer: Medicare Other | Source: Ambulatory Visit | Attending: Family Medicine | Admitting: Family Medicine

## 2020-06-25 ENCOUNTER — Other Ambulatory Visit: Payer: Self-pay | Admitting: Family Medicine

## 2020-06-25 DIAGNOSIS — S0990XA Unspecified injury of head, initial encounter: Secondary | ICD-10-CM | POA: Diagnosis not present

## 2020-06-25 DIAGNOSIS — W19XXXA Unspecified fall, initial encounter: Secondary | ICD-10-CM | POA: Diagnosis not present

## 2020-06-25 DIAGNOSIS — I69354 Hemiplegia and hemiparesis following cerebral infarction affecting left non-dominant side: Secondary | ICD-10-CM | POA: Diagnosis not present

## 2020-06-25 DIAGNOSIS — M533 Sacrococcygeal disorders, not elsewhere classified: Secondary | ICD-10-CM | POA: Diagnosis not present

## 2020-06-25 DIAGNOSIS — I6389 Other cerebral infarction: Secondary | ICD-10-CM | POA: Diagnosis not present

## 2020-06-25 DIAGNOSIS — R102 Pelvic and perineal pain: Secondary | ICD-10-CM

## 2020-06-25 DIAGNOSIS — R1110 Vomiting, unspecified: Secondary | ICD-10-CM | POA: Diagnosis not present

## 2020-06-30 DIAGNOSIS — I130 Hypertensive heart and chronic kidney disease with heart failure and stage 1 through stage 4 chronic kidney disease, or unspecified chronic kidney disease: Secondary | ICD-10-CM | POA: Diagnosis not present

## 2020-06-30 DIAGNOSIS — W19XXXS Unspecified fall, sequela: Secondary | ICD-10-CM | POA: Diagnosis not present

## 2020-06-30 DIAGNOSIS — Z87440 Personal history of urinary (tract) infections: Secondary | ICD-10-CM | POA: Diagnosis not present

## 2020-06-30 DIAGNOSIS — M109 Gout, unspecified: Secondary | ICD-10-CM | POA: Diagnosis not present

## 2020-06-30 DIAGNOSIS — E538 Deficiency of other specified B group vitamins: Secondary | ICD-10-CM | POA: Diagnosis not present

## 2020-06-30 DIAGNOSIS — M159 Polyosteoarthritis, unspecified: Secondary | ICD-10-CM | POA: Diagnosis not present

## 2020-06-30 DIAGNOSIS — I69354 Hemiplegia and hemiparesis following cerebral infarction affecting left non-dominant side: Secondary | ICD-10-CM | POA: Diagnosis not present

## 2020-06-30 DIAGNOSIS — L89891 Pressure ulcer of other site, stage 1: Secondary | ICD-10-CM | POA: Diagnosis not present

## 2020-06-30 DIAGNOSIS — Z9181 History of falling: Secondary | ICD-10-CM | POA: Diagnosis not present

## 2020-06-30 DIAGNOSIS — I2510 Atherosclerotic heart disease of native coronary artery without angina pectoris: Secondary | ICD-10-CM | POA: Diagnosis not present

## 2020-06-30 DIAGNOSIS — M810 Age-related osteoporosis without current pathological fracture: Secondary | ICD-10-CM | POA: Diagnosis not present

## 2020-06-30 DIAGNOSIS — M533 Sacrococcygeal disorders, not elsewhere classified: Secondary | ICD-10-CM | POA: Diagnosis not present

## 2020-06-30 DIAGNOSIS — Z7982 Long term (current) use of aspirin: Secondary | ICD-10-CM | POA: Diagnosis not present

## 2020-06-30 DIAGNOSIS — I270 Primary pulmonary hypertension: Secondary | ICD-10-CM | POA: Diagnosis not present

## 2020-06-30 DIAGNOSIS — Z96652 Presence of left artificial knee joint: Secondary | ICD-10-CM | POA: Diagnosis not present

## 2020-06-30 DIAGNOSIS — N183 Chronic kidney disease, stage 3 unspecified: Secondary | ICD-10-CM | POA: Diagnosis not present

## 2020-06-30 DIAGNOSIS — I5032 Chronic diastolic (congestive) heart failure: Secondary | ICD-10-CM | POA: Diagnosis not present

## 2020-06-30 DIAGNOSIS — E1122 Type 2 diabetes mellitus with diabetic chronic kidney disease: Secondary | ICD-10-CM | POA: Diagnosis not present

## 2020-06-30 DIAGNOSIS — I69393 Ataxia following cerebral infarction: Secondary | ICD-10-CM | POA: Diagnosis not present

## 2020-07-06 DIAGNOSIS — E538 Deficiency of other specified B group vitamins: Secondary | ICD-10-CM | POA: Diagnosis not present

## 2020-07-06 DIAGNOSIS — W19XXXS Unspecified fall, sequela: Secondary | ICD-10-CM | POA: Diagnosis not present

## 2020-07-06 DIAGNOSIS — I270 Primary pulmonary hypertension: Secondary | ICD-10-CM | POA: Diagnosis not present

## 2020-07-06 DIAGNOSIS — H401113 Primary open-angle glaucoma, right eye, severe stage: Secondary | ICD-10-CM | POA: Diagnosis not present

## 2020-07-06 DIAGNOSIS — N183 Chronic kidney disease, stage 3 unspecified: Secondary | ICD-10-CM | POA: Diagnosis not present

## 2020-07-06 DIAGNOSIS — Z961 Presence of intraocular lens: Secondary | ICD-10-CM | POA: Diagnosis not present

## 2020-07-06 DIAGNOSIS — Z7982 Long term (current) use of aspirin: Secondary | ICD-10-CM | POA: Diagnosis not present

## 2020-07-06 DIAGNOSIS — H40012 Open angle with borderline findings, low risk, left eye: Secondary | ICD-10-CM | POA: Diagnosis not present

## 2020-07-06 DIAGNOSIS — I130 Hypertensive heart and chronic kidney disease with heart failure and stage 1 through stage 4 chronic kidney disease, or unspecified chronic kidney disease: Secondary | ICD-10-CM | POA: Diagnosis not present

## 2020-07-06 DIAGNOSIS — Z96652 Presence of left artificial knee joint: Secondary | ICD-10-CM | POA: Diagnosis not present

## 2020-07-06 DIAGNOSIS — Z87440 Personal history of urinary (tract) infections: Secondary | ICD-10-CM | POA: Diagnosis not present

## 2020-07-06 DIAGNOSIS — H04123 Dry eye syndrome of bilateral lacrimal glands: Secondary | ICD-10-CM | POA: Diagnosis not present

## 2020-07-06 DIAGNOSIS — H34811 Central retinal vein occlusion, right eye, with macular edema: Secondary | ICD-10-CM | POA: Diagnosis not present

## 2020-07-06 DIAGNOSIS — I69393 Ataxia following cerebral infarction: Secondary | ICD-10-CM | POA: Diagnosis not present

## 2020-07-06 DIAGNOSIS — I69354 Hemiplegia and hemiparesis following cerebral infarction affecting left non-dominant side: Secondary | ICD-10-CM | POA: Diagnosis not present

## 2020-07-06 DIAGNOSIS — Z9181 History of falling: Secondary | ICD-10-CM | POA: Diagnosis not present

## 2020-07-06 DIAGNOSIS — M109 Gout, unspecified: Secondary | ICD-10-CM | POA: Diagnosis not present

## 2020-07-06 DIAGNOSIS — I5032 Chronic diastolic (congestive) heart failure: Secondary | ICD-10-CM | POA: Diagnosis not present

## 2020-07-06 DIAGNOSIS — M159 Polyosteoarthritis, unspecified: Secondary | ICD-10-CM | POA: Diagnosis not present

## 2020-07-06 DIAGNOSIS — E1122 Type 2 diabetes mellitus with diabetic chronic kidney disease: Secondary | ICD-10-CM | POA: Diagnosis not present

## 2020-07-06 DIAGNOSIS — L89891 Pressure ulcer of other site, stage 1: Secondary | ICD-10-CM | POA: Diagnosis not present

## 2020-07-06 DIAGNOSIS — M810 Age-related osteoporosis without current pathological fracture: Secondary | ICD-10-CM | POA: Diagnosis not present

## 2020-07-06 DIAGNOSIS — M533 Sacrococcygeal disorders, not elsewhere classified: Secondary | ICD-10-CM | POA: Diagnosis not present

## 2020-07-06 DIAGNOSIS — I2510 Atherosclerotic heart disease of native coronary artery without angina pectoris: Secondary | ICD-10-CM | POA: Diagnosis not present

## 2020-07-09 DIAGNOSIS — M810 Age-related osteoporosis without current pathological fracture: Secondary | ICD-10-CM | POA: Diagnosis not present

## 2020-07-09 DIAGNOSIS — Z7982 Long term (current) use of aspirin: Secondary | ICD-10-CM | POA: Diagnosis not present

## 2020-07-09 DIAGNOSIS — I270 Primary pulmonary hypertension: Secondary | ICD-10-CM | POA: Diagnosis not present

## 2020-07-09 DIAGNOSIS — W19XXXS Unspecified fall, sequela: Secondary | ICD-10-CM | POA: Diagnosis not present

## 2020-07-09 DIAGNOSIS — M533 Sacrococcygeal disorders, not elsewhere classified: Secondary | ICD-10-CM | POA: Diagnosis not present

## 2020-07-09 DIAGNOSIS — I2510 Atherosclerotic heart disease of native coronary artery without angina pectoris: Secondary | ICD-10-CM | POA: Diagnosis not present

## 2020-07-09 DIAGNOSIS — I69354 Hemiplegia and hemiparesis following cerebral infarction affecting left non-dominant side: Secondary | ICD-10-CM | POA: Diagnosis not present

## 2020-07-09 DIAGNOSIS — Z96652 Presence of left artificial knee joint: Secondary | ICD-10-CM | POA: Diagnosis not present

## 2020-07-09 DIAGNOSIS — E1122 Type 2 diabetes mellitus with diabetic chronic kidney disease: Secondary | ICD-10-CM | POA: Diagnosis not present

## 2020-07-09 DIAGNOSIS — M159 Polyosteoarthritis, unspecified: Secondary | ICD-10-CM | POA: Diagnosis not present

## 2020-07-09 DIAGNOSIS — I69393 Ataxia following cerebral infarction: Secondary | ICD-10-CM | POA: Diagnosis not present

## 2020-07-09 DIAGNOSIS — Z87440 Personal history of urinary (tract) infections: Secondary | ICD-10-CM | POA: Diagnosis not present

## 2020-07-09 DIAGNOSIS — L89891 Pressure ulcer of other site, stage 1: Secondary | ICD-10-CM | POA: Diagnosis not present

## 2020-07-09 DIAGNOSIS — I130 Hypertensive heart and chronic kidney disease with heart failure and stage 1 through stage 4 chronic kidney disease, or unspecified chronic kidney disease: Secondary | ICD-10-CM | POA: Diagnosis not present

## 2020-07-09 DIAGNOSIS — M109 Gout, unspecified: Secondary | ICD-10-CM | POA: Diagnosis not present

## 2020-07-09 DIAGNOSIS — I5032 Chronic diastolic (congestive) heart failure: Secondary | ICD-10-CM | POA: Diagnosis not present

## 2020-07-09 DIAGNOSIS — E538 Deficiency of other specified B group vitamins: Secondary | ICD-10-CM | POA: Diagnosis not present

## 2020-07-09 DIAGNOSIS — Z9181 History of falling: Secondary | ICD-10-CM | POA: Diagnosis not present

## 2020-07-09 DIAGNOSIS — N183 Chronic kidney disease, stage 3 unspecified: Secondary | ICD-10-CM | POA: Diagnosis not present

## 2020-07-09 NOTE — Progress Notes (Signed)
[facility] Note  07/14/2020     CHIEF COMPLAINT Patient presents for Retina Follow Up   HISTORY OF PRESENT ILLNESS: [name] is a 86 y.o. female who presents to the clinic today for:  HPI    Retina Follow Up    Patient presents with  Wet AMD.  In right eye.  This started months ago.  Severity is severe.  Duration of 9 months.  Since onset it is stable.  I, the attending physician,  performed the HPI with the patient and updated documentation appropriately.          Comments    86 y/o female pt last seen 10/16/19, and referred back by [name] [facility], who last saw pt on 02/17/20.  [name] was concerned about VF loss, decreased VA, nyctalopia, nerve tissue loss and increased cupping OD, and also pt's exu ARMD OD.  Pt has not noticed any change in [place] OU.  Denies pain, FOL, floaters.  Brimonidine and Dorz./Tim. BID OU.       Last edited by Bernarda Caffey, MD on 07/14/2020 11:48 AM. (History)    pt states she doesn't have any vision in her right eye, she states she does not feel like it has gotten any worse since she was here last in January, pt is using latanoprost, brimonidine and Cosopt in both eyes, she is no longer using Rhopressa, pt had TDC with [name] on 03.30.21  Referring physician: Warden Fillers, MD [address],  [place] [id number]  HISTORICAL INFORMATION:   Selected notes from the MEDICAL RECORD NUMBER Referred by Dr. Midge Aver for concern of RVO OS   CURRENT MEDICATIONS: Current Outpatient Medications (Ophthalmic Drugs)  Medication Sig  . brimonidine (ALPHAGAN) 0.2 % ophthalmic solution brimonidine 0.2 % eye drops  INSTILL 1 DROP INTO BOTH EYES TWICE A DAY  . dorzolamide-timolol (COSOPT) 22.3-6.8 MG/ML ophthalmic solution Place 1 drop into both eyes 2 (two) times daily.   Vladimir Faster Glycol-Propyl Glycol (SYSTANE OP) Apply 1 drop to eye 2 (two) times daily.  Marland Kitchen atropine 1 % ophthalmic solution Place one drop into  the right eye 2 (two) times daily for 14 days. Do not take tonight.  [name] will evaluate your post-operative status tomorrow and may make changes to the duration and dose of this medication.  . bimatoprost (LUMIGAN) 0.01 % SOLN Place 1 drop into the right eye at bedtime. (Patient not taking: Reported on 07/14/2020)  . ketorolac (ACULAR) 0.5 % ophthalmic solution Place one drop into the right eye 4 (four) times daily for 28 days.  Marland Kitchen latanoprost (XALATAN) 0.005 % ophthalmic solution latanoprost 0.005 % eye drops  INSTILL 1 DROP INTO RIGHT EYE AT BEDTIME (Patient not taking: Reported on 07/14/2020)  . moxifloxacin (VIGAMOX) 0.5 % ophthalmic solution Apply 1 drop to eye 3 (three) times daily.  Marland Kitchen neomycin-polymyxin-dexameth (MAXITROL) 0.1 % OINT Do not use tonight!   Apply a strip of ointment the size of a grain of rice to RIGHT eye at bedtime. This must be the last eye medicine of the day.   [name] will evaluate your post-operative status tomorrow and may make changes to the duration and dose of this medication.  Mckinley Jewel Dimesylate (RHOPRESSA) 0.02 % SOLN Rhopressa 0.02 % eye drops  INSTILL 1 DROP INTO AFFECTED EYE(S) BY OPHTHALMIC ROUTE ONCE DAILY INTHE EVENING (Patient not taking: Reported on 07/14/2020)  . prednisoLONE acetate (PRED FORTE) 1 %  ophthalmic suspension 1 gtt  4 times a day for one week, then 3 times a day for one week, then 2 times a day for one week, then 1 time a day for one week, then stop right eye. (Patient not taking: Reported on 07/14/2020)  . prednisoLONE acetate (PRED FORTE) 1 % ophthalmic suspension  (Patient not taking: Reported on 07/14/2020)   No current facility-administered medications for this visit. (Ophthalmic Drugs)   Current Outpatient Medications (Other)  Medication Sig  . Acetaminophen (TYLENOL) 325 MG CAPS Take 325 mg by mouth 3 (three) times daily as needed.  Marland Kitchen aspirin 81 MG chewable tablet Chew 81 mg by mouth daily.  Marland Kitchen atorvastatin (LIPITOR) 80 MG  tablet Take 1 tablet (80 mg total) by mouth daily at 6 PM.  . Calcium Carbonate-Vitamin D (CALTRATE 600+D PO) Take 1 tablet by mouth 2 (two) times daily.  . cephALEXin (KEFLEX) 500 MG capsule Take 500 mg by mouth 2 (two) times daily.  . chlorpheniramine (CHLOR-TRIMETON) 4 MG tablet Take 4 mg by mouth every 4 (four) hours as needed for allergies.  Marland Kitchen diclofenac sodium (VOLTAREN) 1 % GEL diclofenac 1 % topical gel  AS DIRECTED TWICE A DAY AS NEEDED TRANSDERMAL 14 DAYS  . doxycycline (VIBRA-TABS) 100 MG tablet Take 1 tablet (100 mg total) by mouth 2 (two) times daily.  Marland Kitchen doxycycline (VIBRAMYCIN) 100 MG capsule Take 100 mg by mouth 2 (two) times daily.  . famotidine (PEPCID) 20 MG tablet Take 20 mg by mouth daily.  Marland Kitchen FLUZONE HIGH-DOSE QUADRIVALENT 0.7 ML SUSY   . furosemide (LASIX) 20 MG tablet Take 20 mg by mouth as needed.   Marland Kitchen losartan (COZAAR) 25 MG tablet Take 50 mg by mouth daily.  Marland Kitchen losartan (COZAAR) 50 MG tablet   . metoprolol (LOPRESSOR) 50 MG tablet Take 25 mg by mouth 2 (two) times daily.  . Multiple Vitamins-Minerals (MULTIPLE VITAMINS/WOMENS PO) Take 1 tablet by mouth daily. W/vitamin D  . MYRBETRIQ 25 MG TB24 tablet Take 25 mg by mouth daily.  . pantoprazole (PROTONIX) 40 MG tablet Take 1 tablet (40 mg total) by mouth daily. Take 30-60 min before first meal of the day  . Zoster Vaccine Adjuvanted [facility]) injection Shingrix (PF) 50 mcg/0.5 mL intramuscular suspension, kit   No current facility-administered medications for this visit. (Other)      REVIEW OF SYSTEMS: ROS    Positive for: Neurological, Genitourinary, Musculoskeletal, Cardiovascular, Eyes   Negative for: Constitutional, Gastrointestinal, Skin, HENT, Endocrine, Respiratory, Psychiatric, Allergic/Imm, Heme/Lymph   Last edited by Matthew Folks, COA on 07/14/2020  9:35 AM. (History)       ALLERGIES Allergies  Allergen Reactions  . Meloxicam Hives  . Other   . Nitrofuran Derivatives Rash    PAST MEDICAL  HISTORY Past Medical History:  Diagnosis Date  . Anemia   . Arthritis   . Glaucoma    OU  . Gout, joint   . Hypertension   . Macular degeneration    OU  . NSTEMI (non-ST elevated myocardial infarction) ([place]) 08/2015  . Stroke [facility])    Past Surgical History:  Procedure Laterality Date  . ABDOMINAL HYSTERECTOMY    . BREAST SURGERY     CYST  . CARDIAC CATHETERIZATION N/A 08/18/2015   Procedure: Left Heart Cath and Coronary Angiography;  Surgeon: Troy Sine, MD;  Location: [facility];  Service: Cardiovascular;  Laterality: N/A;  . CARDIAC CATHETERIZATION  08/18/2015   Procedure: Coronary Stent Intervention;  Surgeon: Marcello Moores  Floyce Stakes, MD;  Location: [facility];  Service: Cardiovascular;;  . CATARACT EXTRACTION Bilateral   . DILATION AND CURETTAGE OF UTERUS    . EYE SURGERY Bilateral   . HERNIA REPAIR    . IRIDOTOMY / IRIDECTOMY Right   . JOINT REPLACEMENT  left knee  . REFRACTIVE SURGERY Right 2017    FAMILY HISTORY Family History  Problem Relation Age of Onset  . Heart disease Mother   . Stroke Mother     SOCIAL HISTORY Social History   Tobacco Use  . Smoking status: Never Smoker  . Smokeless tobacco: Never Used  Substance Use Topics  . Alcohol use: No    Alcohol/week: 0.0 standard drinks  . Drug use: No         OPHTHALMIC EXAM:  Base Eye Exam    Visual Acuity (Snellen - Linear)      Right Left   Dist [place] CF @ 1' 20/40 -2   Dist ph [place] NI NI  VA OD was CF @ 1' peripherally       Tonometry (Tonopen, 9:40 AM)      Right Left   Pressure 12 11       Pupils      Dark Light Shape React APD   Right 3 2 Round Minimal None   Left 3 2 Round Minimal None       Visual Fields (Counting fingers)      Left Right    Full    Restrictions  Total superior temporal, inferior temporal, inferior nasal deficiencies       Extraocular Movement      Right Left    Full, Ortho Full, Ortho       Neuro/Psych    Oriented x3: Yes   Mood/Affect:  Normal       Dilation    Both eyes: 1.0% Mydriacyl, 2.5% Phenylephrine @ 9:40 AM        Slit Lamp and Fundus Exam    Slit Lamp Exam      Right Left   Lids/Lashes Dermatochalasis - upper lid, Telangiectasia, Meibomian gland dysfunction Dermatochalasis - upper lid, mild Meibomian gland dysfunction   Conjunctiva/Sclera Trace Injection Trace Injection   Cornea Arcus, 2+ inferior Punctate epithelial erosions, Well healed cataract wounds, mild Debris in tear film Arcus, 2-3+ inferior Punctate epithelial erosions, Well healed cataract wounds   Anterior Chamber Deep and quiet Deep and quiet   Iris Round and poorly dilated to 62m, No NVI Round and moderately dilated to 570m  Lens PC IOL in good position with open PC PC IOL in good position with open PC   Vitreous Vitreous syneresis, Posterior vitreous detachment, vitreous condensations Vitreous syneresis, Posterior vitreous detachment       Fundus Exam      Right Left   Disc Tilted disc, thin inferior rim, +cupping, 3+pallor, 360 PPA, inferior vascular loops 1+ Pallor, Sharp rim, +cupping, temporal Peripapillary atrophy   C/D Ratio 0.9 0.6   Macula Blunted foveal reflex, inferior edema and IRH, central pigment clumping Flat, Blunted foveal reflex, RPE mottling and clumping, No heme or edema   Vessels Vascular attenuation, Tortuous Vascular attenuation, mild Tortuousity   Periphery Attached, inferior IRH/DBH  Attached, No heme           IMAGING AND PROCEDURES  Imaging and Procedures for '@TODAY' @  OCT, Retina - OU - Both Eyes       Right Eye Quality was good. Central Foveal Thickness: 748. Progression  has worsened. Findings include subretinal hyper-reflective material, intraretinal fluid, intraretinal hyper-reflective material, epiretinal membrane, normal foveal contour, no SRF, outer retinal atrophy (Interval re-development of massive CME).   Left Eye Quality was good. Central Foveal Thickness: 250. Progression has been stable.  Findings include normal foveal contour, no IRF, no SRF, retinal drusen .   Notes *Images captured and stored on drive  Diagnosis / Impression:  OD: HRVO with Interval re-development of massive CME -- +CNVM / exu ARMD component OS: NFP, no IRF/SRF, +drusen  Clinical management:  See below  Abbreviations: NFP - Normal foveal profile. CME - cystoid macular edema. PED - pigment epithelial detachment. IRF - intraretinal fluid. SRF - subretinal fluid. EZ - ellipsoid zone. ERM - epiretinal membrane. ORA - outer retinal atrophy. ORT - outer retinal tubulation. SRHM - subretinal hyper-reflective material        Intravitreal Injection, Pharmacologic Agent - OD - Right Eye       Time Out 07/14/2020. 10:36 AM. Confirmed correct patient, procedure, site, and patient consented.   Anesthesia Topical anesthesia was used. Anesthetic medications included Lidocaine 2%, Proparacaine 0.5%.   Procedure Preparation included eyelid speculum, 5% betadine to ocular surface. A supplied needle was used.   Injection:  1.25 mg Bevacizumab (AVASTIN) SOLN   NDC: 14388-875-79, Lot: [id number], Expiration date: 08/16/2020   Route: Intravitreal, Site: Right Eye, Waste: 0.05 mL  Post-op Post injection exam found visual acuity of at least counting fingers. The patient tolerated the procedure well. There were no complications. The patient received written and verbal post procedure care education.   Notes An AC tap was performed following injection due to elevated IOP using a 30 gauge needle on a syringe with the plunger removed. The needle was placed at the limbus at 7 oclock and approximately 0.07 cc of aqueous was removed from the anterior chamber. Betadine was applied to the tap area before and after the paracentesis was performed. There were no complications. The patient tolerated the procedure well. The IOP was rechecked and was found to be ~8 mmHg by digital palpation.                  ASSESSMENT/PLAN:    ICD-10-CM   1. Branch retinal vein occlusion of right eye with macular edema  H34.8310 Intravitreal Injection, Pharmacologic Agent - OD - Right Eye    Bevacizumab (AVASTIN) SOLN 1.25 mg  2. Retinal edema  H35.81 OCT, Retina - OU - Both Eyes  3. Exudative age-related macular degeneration of right eye with active choroidal neovascularization (HCC)  H35.3211 Intravitreal Injection, Pharmacologic Agent - OD - Right Eye    Bevacizumab (AVASTIN) SOLN 1.25 mg  4. Intermediate stage nonexudative age-related macular degeneration of left eye  H35.3122   5. Essential hypertension  I10   6. Hypertensive retinopathy of both eyes  H35.033   7. Primary open angle glaucoma (POAG) of both eyes, severe stage  H40.1133   8. Pseudophakia of both eyes  Z96.1     1,2. Inferior HRVO w/ CME, OD  - pt lost to f/u since 10/16/2019 (9 mos)  - presenting BCVA CF 2' (09.14.20)  - pt subjectively reports decline in vision OD since Feb 2020  - presenting OCT shows severe CME/IRF temporal macula, +SRF overlying low PED (?exudative ARMD component)  - S/p IVA OD #1 09.14.20, #2 (10.20.20), #3 (12.02.20), #4 (01.06.21)  - OCT shows interval re-development of massive CME, and BCVA remains CF 1' likely due to central ORA and severe POAG  -  discussed findings, poor prognosis and treatment options             - Recommend IVA OD #5 today, 10.05.21 for CME and prevention of further damage; discussed limited benefit to visual acuity OD  - pt wishes to proceed  - RBA of procedure discussed, questions answered  - informed consent obtained and signed  - see procedure note  - f/u 4 weeks, DFE, OCT, possible injection  3. Exudative age related macular degeneration, OD    - initial OCT showed +IRF/SRF -- SRF overlying low lying PED  - exam shows +CNVM with surrounding heme -- improved today  - suspect exudative ARMD component complicating HRVO w/ CME  - S/p IVA OD #1 (09.14.20), #2 (10.20.20), #3 (12.02.20),  #4 (01.06.21)  - recommend IVA OD #5 as above  - f/u in 4 weeks  4. Age related macular degeneration, non-exudative, OS  - The incidence, anatomy, and pathology of dry AMD, risk of progression, and the AREDS and AREDS 2 study including smoking risks discussed with patient.  - Recommend Amsler grid monitoring  5,6. Hypertensive retinopathy OU  - discussed importance of tight BP control  - monitor  7. POAG OU -- severe stage  - under the expert management of Dr. Midge Aver  - s/p SLT OD 3.23.2017  - s/p TDC OD w/ [name], 3.30.21  - IOP good today at 12,11  - pt on Latanoprost QHS OD, Dorzolamide-Timolol BID OU, Brim BID OU  8. Pseudophakia OU  - s/p CE/IOL OU  - beautiful surgeries, doing well  - monitor   Ophthalmic Meds Ordered this visit:  Meds ordered this encounter  Medications  . Bevacizumab (AVASTIN) SOLN 1.25 mg       Return in about 4 weeks (around 08/11/2020) for f/u HRVO with CME OD, DFE, OCT.  There are no Patient Instructions on file for this visit.   Explained the diagnoses, plan, and follow up with the patient and they expressed understanding.  Patient expressed understanding of the importance of proper follow up care.   This document serves as a record of services personally performed by Gardiner Sleeper, MD, PhD. It was created on their behalf by Estill Bakes, COT an ophthalmic technician. The creation of this record is the provider's dictation and/or activities during the visit.    Electronically signed by: Estill Bakes, COT 9.30.21 @ 11:56 AM   This document serves as a record of services personally performed by Gardiner Sleeper, MD, PhD. It was created on their behalf by San Jetty. Owens Shark, OA an ophthalmic technician. The creation of this record is the provider's dictation and/or activities during the visit.    Electronically signed by: San Jetty. Owens Shark, [place] 10.05.2021 11:56 AM  Gardiner Sleeper, M.D., Ph.D. Diseases & Surgery of the Retina and  [place] 07/14/2020   I have reviewed the above documentation for accuracy and completeness, and I agree with the above. Gardiner Sleeper, M.D., Ph.D. 07/14/20 11:56 AM    Abbreviations: M myopia (nearsighted); A astigmatism; H hyperopia (farsighted); P presbyopia; Mrx spectacle prescription;  CTL contact lenses; OD right eye; OS left eye; OU both eyes  XT exotropia; ET esotropia; PEK punctate epithelial keratitis; PEE punctate epithelial erosions; DES dry eye syndrome; MGD meibomian gland dysfunction; ATs artificial tears; PFAT's preservative free artificial tears; [place] nuclear sclerotic cataract; PSC posterior subcapsular cataract; ERM epi-retinal membrane; PVD posterior vitreous detachment; RD retinal detachment; DM diabetes mellitus; DR diabetic  retinopathy; NPDR non-proliferative diabetic retinopathy; PDR proliferative diabetic retinopathy; CSME clinically significant macular edema; DME diabetic macular edema; dbh dot blot hemorrhages; CWS cotton wool spot; POAG primary open angle glaucoma; C/D cup-to-disc ratio; HVF humphrey visual field; GVF goldmann visual field; OCT optical coherence tomography; IOP intraocular pressure; BRVO Branch retinal vein occlusion; CRVO central retinal vein occlusion; CRAO central retinal artery occlusion; BRAO branch retinal artery occlusion; RT retinal tear; SB scleral buckle; PPV pars plana vitrectomy; VH Vitreous hemorrhage; PRP panretinal laser photocoagulation; IVK intravitreal kenalog; VMT vitreomacular traction; MH Macular hole;  NVD neovascularization of the disc; NVE neovascularization elsewhere; AREDS age related eye disease study; ARMD age related macular degeneration; POAG primary open angle glaucoma; EBMD epithelial/anterior basement membrane dystrophy; ACIOL anterior chamber intraocular lens; IOL intraocular lens; PCIOL posterior chamber intraocular lens; Phaco/IOL phacoemulsification with intraocular lens placement; [place]  photorefractive keratectomy; LASIK laser assisted in situ keratomileusis; HTN hypertension; DM diabetes mellitus; COPD chronic obstructive pulmonary disease

## 2020-07-13 DIAGNOSIS — I69393 Ataxia following cerebral infarction: Secondary | ICD-10-CM | POA: Diagnosis not present

## 2020-07-13 DIAGNOSIS — Z9181 History of falling: Secondary | ICD-10-CM | POA: Diagnosis not present

## 2020-07-13 DIAGNOSIS — E1122 Type 2 diabetes mellitus with diabetic chronic kidney disease: Secondary | ICD-10-CM | POA: Diagnosis not present

## 2020-07-13 DIAGNOSIS — M533 Sacrococcygeal disorders, not elsewhere classified: Secondary | ICD-10-CM | POA: Diagnosis not present

## 2020-07-13 DIAGNOSIS — Z87440 Personal history of urinary (tract) infections: Secondary | ICD-10-CM | POA: Diagnosis not present

## 2020-07-13 DIAGNOSIS — I2510 Atherosclerotic heart disease of native coronary artery without angina pectoris: Secondary | ICD-10-CM | POA: Diagnosis not present

## 2020-07-13 DIAGNOSIS — I130 Hypertensive heart and chronic kidney disease with heart failure and stage 1 through stage 4 chronic kidney disease, or unspecified chronic kidney disease: Secondary | ICD-10-CM | POA: Diagnosis not present

## 2020-07-13 DIAGNOSIS — M810 Age-related osteoporosis without current pathological fracture: Secondary | ICD-10-CM | POA: Diagnosis not present

## 2020-07-13 DIAGNOSIS — M159 Polyosteoarthritis, unspecified: Secondary | ICD-10-CM | POA: Diagnosis not present

## 2020-07-13 DIAGNOSIS — E538 Deficiency of other specified B group vitamins: Secondary | ICD-10-CM | POA: Diagnosis not present

## 2020-07-13 DIAGNOSIS — I5032 Chronic diastolic (congestive) heart failure: Secondary | ICD-10-CM | POA: Diagnosis not present

## 2020-07-13 DIAGNOSIS — N183 Chronic kidney disease, stage 3 unspecified: Secondary | ICD-10-CM | POA: Diagnosis not present

## 2020-07-13 DIAGNOSIS — M109 Gout, unspecified: Secondary | ICD-10-CM | POA: Diagnosis not present

## 2020-07-13 DIAGNOSIS — L89891 Pressure ulcer of other site, stage 1: Secondary | ICD-10-CM | POA: Diagnosis not present

## 2020-07-13 DIAGNOSIS — Z7982 Long term (current) use of aspirin: Secondary | ICD-10-CM | POA: Diagnosis not present

## 2020-07-13 DIAGNOSIS — W19XXXS Unspecified fall, sequela: Secondary | ICD-10-CM | POA: Diagnosis not present

## 2020-07-13 DIAGNOSIS — I270 Primary pulmonary hypertension: Secondary | ICD-10-CM | POA: Diagnosis not present

## 2020-07-13 DIAGNOSIS — I69354 Hemiplegia and hemiparesis following cerebral infarction affecting left non-dominant side: Secondary | ICD-10-CM | POA: Diagnosis not present

## 2020-07-13 DIAGNOSIS — Z96652 Presence of left artificial knee joint: Secondary | ICD-10-CM | POA: Diagnosis not present

## 2020-07-14 ENCOUNTER — Ambulatory Visit (INDEPENDENT_AMBULATORY_CARE_PROVIDER_SITE_OTHER): Payer: Medicare Other | Admitting: Ophthalmology

## 2020-07-14 ENCOUNTER — Encounter (INDEPENDENT_AMBULATORY_CARE_PROVIDER_SITE_OTHER): Payer: Self-pay | Admitting: Ophthalmology

## 2020-07-14 ENCOUNTER — Other Ambulatory Visit: Payer: Self-pay

## 2020-07-14 DIAGNOSIS — H3581 Retinal edema: Secondary | ICD-10-CM

## 2020-07-14 DIAGNOSIS — H353211 Exudative age-related macular degeneration, right eye, with active choroidal neovascularization: Secondary | ICD-10-CM | POA: Diagnosis not present

## 2020-07-14 DIAGNOSIS — H401133 Primary open-angle glaucoma, bilateral, severe stage: Secondary | ICD-10-CM

## 2020-07-14 DIAGNOSIS — I10 Essential (primary) hypertension: Secondary | ICD-10-CM

## 2020-07-14 DIAGNOSIS — H353122 Nonexudative age-related macular degeneration, left eye, intermediate dry stage: Secondary | ICD-10-CM

## 2020-07-14 DIAGNOSIS — H35033 Hypertensive retinopathy, bilateral: Secondary | ICD-10-CM

## 2020-07-14 DIAGNOSIS — Z961 Presence of intraocular lens: Secondary | ICD-10-CM

## 2020-07-14 DIAGNOSIS — H34831 Tributary (branch) retinal vein occlusion, right eye, with macular edema: Secondary | ICD-10-CM

## 2020-07-14 MED ORDER — BEVACIZUMAB CHEMO INJECTION 1.25MG/0.05ML SYRINGE FOR KALEIDOSCOPE
1.2500 mg | INTRAVITREAL | Status: AC | PRN
  Administered 2020-07-14: 1.25 mg via INTRAVITREAL

## 2020-07-15 ENCOUNTER — Encounter: Payer: Self-pay | Admitting: Podiatry

## 2020-07-15 ENCOUNTER — Ambulatory Visit: Payer: Medicare Other | Admitting: Podiatry

## 2020-07-15 ENCOUNTER — Other Ambulatory Visit: Payer: Self-pay

## 2020-07-15 DIAGNOSIS — L98491 Non-pressure chronic ulcer of skin of other sites limited to breakdown of skin: Secondary | ICD-10-CM

## 2020-07-15 DIAGNOSIS — G629 Polyneuropathy, unspecified: Secondary | ICD-10-CM | POA: Diagnosis not present

## 2020-07-15 DIAGNOSIS — N183 Chronic kidney disease, stage 3 unspecified: Secondary | ICD-10-CM | POA: Diagnosis not present

## 2020-07-15 DIAGNOSIS — M25521 Pain in right elbow: Secondary | ICD-10-CM | POA: Diagnosis not present

## 2020-07-15 DIAGNOSIS — M25512 Pain in left shoulder: Secondary | ICD-10-CM | POA: Diagnosis not present

## 2020-07-15 DIAGNOSIS — M25511 Pain in right shoulder: Secondary | ICD-10-CM | POA: Diagnosis not present

## 2020-07-15 DIAGNOSIS — M25522 Pain in left elbow: Secondary | ICD-10-CM | POA: Diagnosis not present

## 2020-07-16 DIAGNOSIS — L89891 Pressure ulcer of other site, stage 1: Secondary | ICD-10-CM | POA: Diagnosis not present

## 2020-07-16 DIAGNOSIS — I69393 Ataxia following cerebral infarction: Secondary | ICD-10-CM | POA: Diagnosis not present

## 2020-07-16 DIAGNOSIS — M109 Gout, unspecified: Secondary | ICD-10-CM | POA: Diagnosis not present

## 2020-07-16 DIAGNOSIS — I270 Primary pulmonary hypertension: Secondary | ICD-10-CM | POA: Diagnosis not present

## 2020-07-16 DIAGNOSIS — N183 Chronic kidney disease, stage 3 unspecified: Secondary | ICD-10-CM | POA: Diagnosis not present

## 2020-07-16 DIAGNOSIS — M810 Age-related osteoporosis without current pathological fracture: Secondary | ICD-10-CM | POA: Diagnosis not present

## 2020-07-16 DIAGNOSIS — I2510 Atherosclerotic heart disease of native coronary artery without angina pectoris: Secondary | ICD-10-CM | POA: Diagnosis not present

## 2020-07-16 DIAGNOSIS — Z96652 Presence of left artificial knee joint: Secondary | ICD-10-CM | POA: Diagnosis not present

## 2020-07-16 DIAGNOSIS — Z87440 Personal history of urinary (tract) infections: Secondary | ICD-10-CM | POA: Diagnosis not present

## 2020-07-16 DIAGNOSIS — I69354 Hemiplegia and hemiparesis following cerebral infarction affecting left non-dominant side: Secondary | ICD-10-CM | POA: Diagnosis not present

## 2020-07-16 DIAGNOSIS — E1122 Type 2 diabetes mellitus with diabetic chronic kidney disease: Secondary | ICD-10-CM | POA: Diagnosis not present

## 2020-07-16 DIAGNOSIS — I5032 Chronic diastolic (congestive) heart failure: Secondary | ICD-10-CM | POA: Diagnosis not present

## 2020-07-16 DIAGNOSIS — M159 Polyosteoarthritis, unspecified: Secondary | ICD-10-CM | POA: Diagnosis not present

## 2020-07-16 DIAGNOSIS — E538 Deficiency of other specified B group vitamins: Secondary | ICD-10-CM | POA: Diagnosis not present

## 2020-07-16 DIAGNOSIS — M533 Sacrococcygeal disorders, not elsewhere classified: Secondary | ICD-10-CM | POA: Diagnosis not present

## 2020-07-16 DIAGNOSIS — I130 Hypertensive heart and chronic kidney disease with heart failure and stage 1 through stage 4 chronic kidney disease, or unspecified chronic kidney disease: Secondary | ICD-10-CM | POA: Diagnosis not present

## 2020-07-16 DIAGNOSIS — W19XXXS Unspecified fall, sequela: Secondary | ICD-10-CM | POA: Diagnosis not present

## 2020-07-16 DIAGNOSIS — Z7982 Long term (current) use of aspirin: Secondary | ICD-10-CM | POA: Diagnosis not present

## 2020-07-16 DIAGNOSIS — Z9181 History of falling: Secondary | ICD-10-CM | POA: Diagnosis not present

## 2020-07-19 NOTE — Progress Notes (Signed)
Subjective:  Patient ID: [name], female    DOB: [birth year],  MRN: [id number]  86 y.o. female is seen for follow up ulcer submet head 4 right foot.  Daughter is present during today's visit and accompanies her on every visit.  [name] states the bunion area continues to feel good.  Daughter states Mom hasn's been walking as much.  She wears off-loaded Darco shoe at home mostly and had new Peg Assist system applied on last visit on 06/03/2020.  Wound location: submet head 4 right foot. They deny any redness, drainage or swelling around wound.  Patient denies fever, chills, night sweats, nausea, or vomiting.  Past Medical History:  Diagnosis Date  . Anemia   . Arthritis   . Glaucoma    OU  . Gout, joint   . Hypertension   . Macular degeneration    OU  . NSTEMI (non-ST elevated myocardial infarction) ([place]) 08/2015  . Stroke [facility])      Past Surgical History:  Procedure Laterality Date  . ABDOMINAL HYSTERECTOMY    . BREAST SURGERY     CYST  . CARDIAC CATHETERIZATION N/A 08/18/2015   Procedure: Left Heart Cath and Coronary Angiography;  Surgeon: Troy Sine, MD;  Location: [place] [facility];  Service: Cardiovascular;  Laterality: N/A;  . CARDIAC CATHETERIZATION  08/18/2015   Procedure: Coronary Stent Intervention;  Surgeon: Troy Sine, MD;  Location: [place] [facility];  Service: Cardiovascular;;  . CATARACT EXTRACTION Bilateral   . DILATION AND CURETTAGE OF UTERUS    . EYE SURGERY Bilateral   . HERNIA REPAIR    . IRIDOTOMY / IRIDECTOMY Right   . JOINT REPLACEMENT  left knee  . REFRACTIVE SURGERY Right 2017     Current Outpatient Medications on File Prior to Visit  Medication Sig Dispense Refill  . Acetaminophen (TYLENOL) 325 MG CAPS Take 325 mg by mouth 3 (three) times daily as needed.    Marland Kitchen aspirin 81 MG chewable tablet Chew 81 mg by mouth daily.    Marland Kitchen atorvastatin (LIPITOR) 80 MG tablet Take 1 tablet (80 mg total) by mouth daily at 6 PM. 60 tablet 0  .  bimatoprost (LUMIGAN) 0.01 % SOLN Place 1 drop into the right eye at bedtime.     . brimonidine (ALPHAGAN) 0.2 % ophthalmic solution brimonidine 0.2 % eye drops  INSTILL 1 DROP INTO BOTH EYES TWICE A DAY    . Calcium Carbonate-Vitamin D (CALTRATE 600+D PO) Take 1 tablet by mouth 2 (two) times daily.    . cephALEXin (KEFLEX) 500 MG capsule Take 500 mg by mouth 2 (two) times daily.    . chlorpheniramine (CHLOR-TRIMETON) 4 MG tablet Take 4 mg by mouth every 4 (four) hours as needed for allergies.    Marland Kitchen diclofenac sodium (VOLTAREN) 1 % GEL diclofenac 1 % topical gel  AS DIRECTED TWICE A DAY AS NEEDED TRANSDERMAL 14 DAYS    . dorzolamide-timolol (COSOPT) 22.3-6.8 MG/ML ophthalmic solution Place 1 drop into both eyes 2 (two) times daily.     Marland Kitchen doxycycline (VIBRA-TABS) 100 MG tablet Take 1 tablet (100 mg total) by mouth 2 (two) times daily. 20 tablet 0  . doxycycline (VIBRAMYCIN) 100 MG capsule Take 100 mg by mouth 2 (two) times daily.    . famotidine (PEPCID) 20 MG tablet Take 20 mg by mouth daily.    Marland Kitchen FLUZONE HIGH-DOSE QUADRIVALENT 0.7 ML SUSY     . furosemide (LASIX) 20 MG tablet Take 20 mg by  mouth as needed.     . latanoprost (XALATAN) 0.005 % ophthalmic solution latanoprost 0.005 % eye drops  INSTILL 1 DROP INTO RIGHT EYE AT BEDTIME    . losartan (COZAAR) 25 MG tablet Take 50 mg by mouth daily.    Marland Kitchen losartan (COZAAR) 50 MG tablet     . metoprolol (LOPRESSOR) 50 MG tablet Take 25 mg by mouth 2 (two) times daily.    . Multiple Vitamins-Minerals (MULTIPLE VITAMINS/WOMENS PO) Take 1 tablet by mouth daily. W/vitamin D    . MYRBETRIQ 25 MG TB24 tablet Take 25 mg by mouth daily.    Mckinley Jewel Dimesylate (RHOPRESSA) 0.02 % SOLN Rhopressa 0.02 % eye drops  INSTILL 1 DROP INTO AFFECTED EYE(S) BY OPHTHALMIC ROUTE ONCE DAILY INTHE EVENING    . pantoprazole (PROTONIX) 40 MG tablet Take 1 tablet (40 mg total) by mouth daily. Take 30-60 min before first meal of the day 30 tablet 2  . Polyethyl  Glycol-Propyl Glycol (SYSTANE OP) Apply 1 drop to eye 2 (two) times daily.    . prednisoLONE acetate (PRED FORTE) 1 % ophthalmic suspension 1 gtt  4 times a day for one week, then 3 times a day for one week, then 2 times a day for one week, then 1 time a day for one week, then stop right eye.    . prednisoLONE acetate (PRED FORTE) 1 % ophthalmic suspension     . Zoster Vaccine Adjuvanted [facility]) injection Shingrix (PF) 50 mcg/0.5 mL intramuscular suspension, kit    . atropine 1 % ophthalmic solution Place one drop into the right eye 2 (two) times daily for 14 days. Do not take tonight.  [name] will evaluate your post-operative status tomorrow and may make changes to the duration and dose of this medication.    Marland Kitchen ketorolac (ACULAR) 0.5 % ophthalmic solution Place one drop into the right eye 4 (four) times daily for 28 days.    Marland Kitchen moxifloxacin (VIGAMOX) 0.5 % ophthalmic solution Apply 1 drop to eye 3 (three) times daily.    Marland Kitchen neomycin-polymyxin-dexameth (MAXITROL) 0.1 % OINT Do not use tonight!   Apply a strip of ointment the size of a grain of rice to RIGHT eye at bedtime. This must be the last eye medicine of the day.   [name] will evaluate your post-operative status tomorrow and may make changes to the duration and dose of this medication.     No current facility-administered medications on file prior to visit.     Allergies  Allergen Reactions  . Meloxicam Hives  . Other   . Nitrofuran Derivatives Rash     Family History  Problem Relation Age of Onset  . Heart disease Mother   . Stroke Mother      Social History   Occupational History    Comment: retired  Tobacco Use  . Smoking status: Never Smoker  . Smokeless tobacco: Never Used  Substance and Sexual Activity  . Alcohol use: No    Alcohol/week: 0.0 standard drinks  . Drug use: No  . Sexual activity: Never    Birth control/protection: Post-menopausal     Immunization History  Administered Date(s) Administered    . Influenza Whole 06/10/2017  . Influenza-Unspecified 07/26/2017  . PFIZER SARS-COV-2 Vaccination 11/16/2019, 12/11/2019  . Pneumococcal Polysaccharide-23 09/28/2012   Objective:  Physical Exam: There were no vitals filed for this visit.   86 y.o. female pleasant Caucasian female in NAD. AAO x 3.  Neurovascular status unchanged b/l. Capillary  fill time to digits <3 seconds b/l. Palpable DP pulses b/l. Palpable PT pulses b/l. Pedal hair absent b/l Skin temperature gradient within normal limits b/l. Nonpitting edema noted b/l LE.  Pedal skin with normal turgor, texture and tone bilaterally. No interdigital macerations bilaterally. Toenails 1-5 b/l well maintained with adequate length. No erythema, no edema, no drainage, no flocculence. No erythema, no edema, no drainage, no flocculence.   Wound Location: ball of right foot There is a moderate amount of devitalized hyperkeratotic tissue present.   Predebridement Wound Measurement: 0.3 x 0.2 cm with hyperkeratotic roof and subdermal hemorrhage Postdebridement Wound Measurement: 0.3 x 0.2 x 0.1 cm and is partial thickness submet head 3 right foot.   Wound Base:  Epithelial Peri-wound: Calloused Exudate: None: wound tissue dry Blood Loss during debridement: 0 cc's. Description of tissue removed from ulceration today:  Hyperkeratosis and devitalized subcutaneous tissue. Signs of clinical bacterial infection are absent. Material in wound which inhibits healing/promotes adjacent tissue breakdown: Hyperkeratosis  Normal muscle strength 5/5 to all lower extremity muscle groups bilaterally. No pain crepitus or joint limitation noted with ROM b/l. Plantarflexed metatarsal(s) right lower extremity. Hallux valgus with bunion deformity noted b/l lower extremities. Hammertoes noted to the 2-5 bilaterally. Noted plantar fat pad atrophy of forefoot area b/l lower extremities.  Protective sensation diminished with 10g monofilament b/l. Proprioception  intact bilaterally.  Assessment:   1. Neurogenic ulcer, limited to breakdown of skin ([place])   2. Neuropathy   3. Stage 3 chronic kidney disease, unspecified whether stage 3a or 3b CKD ([place])    Plan:  -Patient was evaluated and treated and all questions answered.  -Porokeratotic lesion right 1st metatarsal head: continue daily use of tubefoam bunion shield. -Patient/POA/Family member educated on diagnosis and treatment plan of routine ulcer debridement/wound care.  -Type/amount of devitalized tissue removed: devitalized hyperkeratotic tissue. -Today's postdebridement measurements: 0.3 x 0.2 x 0.1 cm submet head 3 right foot ulcer size post-debridement: Ulcer cleansed with wound cleanser. Triple antibiotic ointment and band-aid applied. -New Peg Assist was added to Darco shoe right foot on 06/03/2020. -Wound responded well to today's debridement. -Patient risk factors affecting healing of ulcer: foot deformity, neuropathy CKD, fat pad atrophy b/l feet, recurrent ulceration. -Daughter is well experienced/educated onf the chronic ulceration. She was instructed to apply Iodosorb Gel to wounds once daily if they start to drain. If it opens and does not drain, she may apply the Medihoney. -Frequency of debridement needed to achieve healing: every 2-3 weeks to prevent breakdown.Daughter knows to call office for myself or Dr. Jacqualyn Posey if condition worsens. -Call office if condition worsens or Ms. Feldhaus experiences any increased redness, drainage, swelling, warmth of right foot, fever, chills, night sweats, nausea, vomiting  Return in about 3 weeks (around 08/05/2020) for foot ulcer right foot.  Marzetta Board, DPM

## 2020-07-20 DIAGNOSIS — Z7982 Long term (current) use of aspirin: Secondary | ICD-10-CM | POA: Diagnosis not present

## 2020-07-20 DIAGNOSIS — M159 Polyosteoarthritis, unspecified: Secondary | ICD-10-CM | POA: Diagnosis not present

## 2020-07-20 DIAGNOSIS — W19XXXS Unspecified fall, sequela: Secondary | ICD-10-CM | POA: Diagnosis not present

## 2020-07-20 DIAGNOSIS — I270 Primary pulmonary hypertension: Secondary | ICD-10-CM | POA: Diagnosis not present

## 2020-07-20 DIAGNOSIS — Z87440 Personal history of urinary (tract) infections: Secondary | ICD-10-CM | POA: Diagnosis not present

## 2020-07-20 DIAGNOSIS — I69354 Hemiplegia and hemiparesis following cerebral infarction affecting left non-dominant side: Secondary | ICD-10-CM | POA: Diagnosis not present

## 2020-07-20 DIAGNOSIS — M533 Sacrococcygeal disorders, not elsewhere classified: Secondary | ICD-10-CM | POA: Diagnosis not present

## 2020-07-20 DIAGNOSIS — Z96652 Presence of left artificial knee joint: Secondary | ICD-10-CM | POA: Diagnosis not present

## 2020-07-20 DIAGNOSIS — Z9181 History of falling: Secondary | ICD-10-CM | POA: Diagnosis not present

## 2020-07-20 DIAGNOSIS — M109 Gout, unspecified: Secondary | ICD-10-CM | POA: Diagnosis not present

## 2020-07-20 DIAGNOSIS — I2510 Atherosclerotic heart disease of native coronary artery without angina pectoris: Secondary | ICD-10-CM | POA: Diagnosis not present

## 2020-07-20 DIAGNOSIS — I5032 Chronic diastolic (congestive) heart failure: Secondary | ICD-10-CM | POA: Diagnosis not present

## 2020-07-20 DIAGNOSIS — E538 Deficiency of other specified B group vitamins: Secondary | ICD-10-CM | POA: Diagnosis not present

## 2020-07-20 DIAGNOSIS — M810 Age-related osteoporosis without current pathological fracture: Secondary | ICD-10-CM | POA: Diagnosis not present

## 2020-07-20 DIAGNOSIS — N183 Chronic kidney disease, stage 3 unspecified: Secondary | ICD-10-CM | POA: Diagnosis not present

## 2020-07-20 DIAGNOSIS — I69393 Ataxia following cerebral infarction: Secondary | ICD-10-CM | POA: Diagnosis not present

## 2020-07-20 DIAGNOSIS — L89891 Pressure ulcer of other site, stage 1: Secondary | ICD-10-CM | POA: Diagnosis not present

## 2020-07-20 DIAGNOSIS — I130 Hypertensive heart and chronic kidney disease with heart failure and stage 1 through stage 4 chronic kidney disease, or unspecified chronic kidney disease: Secondary | ICD-10-CM | POA: Diagnosis not present

## 2020-07-20 DIAGNOSIS — E1122 Type 2 diabetes mellitus with diabetic chronic kidney disease: Secondary | ICD-10-CM | POA: Diagnosis not present

## 2020-07-24 DIAGNOSIS — Z7982 Long term (current) use of aspirin: Secondary | ICD-10-CM | POA: Diagnosis not present

## 2020-07-24 DIAGNOSIS — Z9181 History of falling: Secondary | ICD-10-CM | POA: Diagnosis not present

## 2020-07-24 DIAGNOSIS — I130 Hypertensive heart and chronic kidney disease with heart failure and stage 1 through stage 4 chronic kidney disease, or unspecified chronic kidney disease: Secondary | ICD-10-CM | POA: Diagnosis not present

## 2020-07-24 DIAGNOSIS — M810 Age-related osteoporosis without current pathological fracture: Secondary | ICD-10-CM | POA: Diagnosis not present

## 2020-07-24 DIAGNOSIS — I2510 Atherosclerotic heart disease of native coronary artery without angina pectoris: Secondary | ICD-10-CM | POA: Diagnosis not present

## 2020-07-24 DIAGNOSIS — E1122 Type 2 diabetes mellitus with diabetic chronic kidney disease: Secondary | ICD-10-CM | POA: Diagnosis not present

## 2020-07-24 DIAGNOSIS — I270 Primary pulmonary hypertension: Secondary | ICD-10-CM | POA: Diagnosis not present

## 2020-07-24 DIAGNOSIS — I69393 Ataxia following cerebral infarction: Secondary | ICD-10-CM | POA: Diagnosis not present

## 2020-07-24 DIAGNOSIS — I69354 Hemiplegia and hemiparesis following cerebral infarction affecting left non-dominant side: Secondary | ICD-10-CM | POA: Diagnosis not present

## 2020-07-24 DIAGNOSIS — M533 Sacrococcygeal disorders, not elsewhere classified: Secondary | ICD-10-CM | POA: Diagnosis not present

## 2020-07-24 DIAGNOSIS — Z96652 Presence of left artificial knee joint: Secondary | ICD-10-CM | POA: Diagnosis not present

## 2020-07-24 DIAGNOSIS — E538 Deficiency of other specified B group vitamins: Secondary | ICD-10-CM | POA: Diagnosis not present

## 2020-07-24 DIAGNOSIS — W19XXXS Unspecified fall, sequela: Secondary | ICD-10-CM | POA: Diagnosis not present

## 2020-07-24 DIAGNOSIS — M109 Gout, unspecified: Secondary | ICD-10-CM | POA: Diagnosis not present

## 2020-07-24 DIAGNOSIS — L89891 Pressure ulcer of other site, stage 1: Secondary | ICD-10-CM | POA: Diagnosis not present

## 2020-07-24 DIAGNOSIS — Z87440 Personal history of urinary (tract) infections: Secondary | ICD-10-CM | POA: Diagnosis not present

## 2020-07-24 DIAGNOSIS — M159 Polyosteoarthritis, unspecified: Secondary | ICD-10-CM | POA: Diagnosis not present

## 2020-07-24 DIAGNOSIS — N183 Chronic kidney disease, stage 3 unspecified: Secondary | ICD-10-CM | POA: Diagnosis not present

## 2020-07-24 DIAGNOSIS — I5032 Chronic diastolic (congestive) heart failure: Secondary | ICD-10-CM | POA: Diagnosis not present

## 2020-07-27 DIAGNOSIS — I2510 Atherosclerotic heart disease of native coronary artery without angina pectoris: Secondary | ICD-10-CM | POA: Diagnosis not present

## 2020-07-27 DIAGNOSIS — I130 Hypertensive heart and chronic kidney disease with heart failure and stage 1 through stage 4 chronic kidney disease, or unspecified chronic kidney disease: Secondary | ICD-10-CM | POA: Diagnosis not present

## 2020-07-27 DIAGNOSIS — Z9181 History of falling: Secondary | ICD-10-CM | POA: Diagnosis not present

## 2020-07-27 DIAGNOSIS — W19XXXS Unspecified fall, sequela: Secondary | ICD-10-CM | POA: Diagnosis not present

## 2020-07-27 DIAGNOSIS — I5032 Chronic diastolic (congestive) heart failure: Secondary | ICD-10-CM | POA: Diagnosis not present

## 2020-07-27 DIAGNOSIS — E1122 Type 2 diabetes mellitus with diabetic chronic kidney disease: Secondary | ICD-10-CM | POA: Diagnosis not present

## 2020-07-27 DIAGNOSIS — Z7982 Long term (current) use of aspirin: Secondary | ICD-10-CM | POA: Diagnosis not present

## 2020-07-27 DIAGNOSIS — M810 Age-related osteoporosis without current pathological fracture: Secondary | ICD-10-CM | POA: Diagnosis not present

## 2020-07-27 DIAGNOSIS — Z96652 Presence of left artificial knee joint: Secondary | ICD-10-CM | POA: Diagnosis not present

## 2020-07-27 DIAGNOSIS — M159 Polyosteoarthritis, unspecified: Secondary | ICD-10-CM | POA: Diagnosis not present

## 2020-07-27 DIAGNOSIS — I69393 Ataxia following cerebral infarction: Secondary | ICD-10-CM | POA: Diagnosis not present

## 2020-07-27 DIAGNOSIS — M109 Gout, unspecified: Secondary | ICD-10-CM | POA: Diagnosis not present

## 2020-07-27 DIAGNOSIS — Z87440 Personal history of urinary (tract) infections: Secondary | ICD-10-CM | POA: Diagnosis not present

## 2020-07-27 DIAGNOSIS — E538 Deficiency of other specified B group vitamins: Secondary | ICD-10-CM | POA: Diagnosis not present

## 2020-07-27 DIAGNOSIS — I69354 Hemiplegia and hemiparesis following cerebral infarction affecting left non-dominant side: Secondary | ICD-10-CM | POA: Diagnosis not present

## 2020-07-27 DIAGNOSIS — M533 Sacrococcygeal disorders, not elsewhere classified: Secondary | ICD-10-CM | POA: Diagnosis not present

## 2020-07-27 DIAGNOSIS — L89891 Pressure ulcer of other site, stage 1: Secondary | ICD-10-CM | POA: Diagnosis not present

## 2020-07-27 DIAGNOSIS — I270 Primary pulmonary hypertension: Secondary | ICD-10-CM | POA: Diagnosis not present

## 2020-07-27 DIAGNOSIS — N183 Chronic kidney disease, stage 3 unspecified: Secondary | ICD-10-CM | POA: Diagnosis not present

## 2020-07-30 DIAGNOSIS — E538 Deficiency of other specified B group vitamins: Secondary | ICD-10-CM | POA: Diagnosis not present

## 2020-07-30 DIAGNOSIS — Z9181 History of falling: Secondary | ICD-10-CM | POA: Diagnosis not present

## 2020-07-30 DIAGNOSIS — I5032 Chronic diastolic (congestive) heart failure: Secondary | ICD-10-CM | POA: Diagnosis not present

## 2020-07-30 DIAGNOSIS — M810 Age-related osteoporosis without current pathological fracture: Secondary | ICD-10-CM | POA: Diagnosis not present

## 2020-07-30 DIAGNOSIS — L89891 Pressure ulcer of other site, stage 1: Secondary | ICD-10-CM | POA: Diagnosis not present

## 2020-07-30 DIAGNOSIS — I69354 Hemiplegia and hemiparesis following cerebral infarction affecting left non-dominant side: Secondary | ICD-10-CM | POA: Diagnosis not present

## 2020-07-30 DIAGNOSIS — I130 Hypertensive heart and chronic kidney disease with heart failure and stage 1 through stage 4 chronic kidney disease, or unspecified chronic kidney disease: Secondary | ICD-10-CM | POA: Diagnosis not present

## 2020-07-30 DIAGNOSIS — M159 Polyosteoarthritis, unspecified: Secondary | ICD-10-CM | POA: Diagnosis not present

## 2020-07-30 DIAGNOSIS — I69393 Ataxia following cerebral infarction: Secondary | ICD-10-CM | POA: Diagnosis not present

## 2020-07-30 DIAGNOSIS — W19XXXS Unspecified fall, sequela: Secondary | ICD-10-CM | POA: Diagnosis not present

## 2020-07-30 DIAGNOSIS — E785 Hyperlipidemia, unspecified: Secondary | ICD-10-CM | POA: Diagnosis not present

## 2020-07-30 DIAGNOSIS — E1122 Type 2 diabetes mellitus with diabetic chronic kidney disease: Secondary | ICD-10-CM | POA: Diagnosis not present

## 2020-07-30 DIAGNOSIS — Z87440 Personal history of urinary (tract) infections: Secondary | ICD-10-CM | POA: Diagnosis not present

## 2020-07-30 DIAGNOSIS — I129 Hypertensive chronic kidney disease with stage 1 through stage 4 chronic kidney disease, or unspecified chronic kidney disease: Secondary | ICD-10-CM | POA: Diagnosis not present

## 2020-07-30 DIAGNOSIS — M533 Sacrococcygeal disorders, not elsewhere classified: Secondary | ICD-10-CM | POA: Diagnosis not present

## 2020-07-30 DIAGNOSIS — N390 Urinary tract infection, site not specified: Secondary | ICD-10-CM | POA: Diagnosis not present

## 2020-07-30 DIAGNOSIS — Z96652 Presence of left artificial knee joint: Secondary | ICD-10-CM | POA: Diagnosis not present

## 2020-07-30 DIAGNOSIS — I270 Primary pulmonary hypertension: Secondary | ICD-10-CM | POA: Diagnosis not present

## 2020-07-30 DIAGNOSIS — I2510 Atherosclerotic heart disease of native coronary artery without angina pectoris: Secondary | ICD-10-CM | POA: Diagnosis not present

## 2020-07-30 DIAGNOSIS — Z7982 Long term (current) use of aspirin: Secondary | ICD-10-CM | POA: Diagnosis not present

## 2020-07-30 DIAGNOSIS — M109 Gout, unspecified: Secondary | ICD-10-CM | POA: Diagnosis not present

## 2020-07-30 DIAGNOSIS — N183 Chronic kidney disease, stage 3 unspecified: Secondary | ICD-10-CM | POA: Diagnosis not present

## 2020-08-03 DIAGNOSIS — W19XXXS Unspecified fall, sequela: Secondary | ICD-10-CM | POA: Diagnosis not present

## 2020-08-03 DIAGNOSIS — N183 Chronic kidney disease, stage 3 unspecified: Secondary | ICD-10-CM | POA: Diagnosis not present

## 2020-08-03 DIAGNOSIS — L03116 Cellulitis of left lower limb: Secondary | ICD-10-CM | POA: Diagnosis not present

## 2020-08-03 DIAGNOSIS — I69393 Ataxia following cerebral infarction: Secondary | ICD-10-CM | POA: Diagnosis not present

## 2020-08-03 DIAGNOSIS — I69354 Hemiplegia and hemiparesis following cerebral infarction affecting left non-dominant side: Secondary | ICD-10-CM | POA: Diagnosis not present

## 2020-08-03 DIAGNOSIS — E538 Deficiency of other specified B group vitamins: Secondary | ICD-10-CM | POA: Diagnosis not present

## 2020-08-03 DIAGNOSIS — I270 Primary pulmonary hypertension: Secondary | ICD-10-CM | POA: Diagnosis not present

## 2020-08-03 DIAGNOSIS — Z96652 Presence of left artificial knee joint: Secondary | ICD-10-CM | POA: Diagnosis not present

## 2020-08-03 DIAGNOSIS — Z23 Encounter for immunization: Secondary | ICD-10-CM | POA: Diagnosis not present

## 2020-08-03 DIAGNOSIS — Z87440 Personal history of urinary (tract) infections: Secondary | ICD-10-CM | POA: Diagnosis not present

## 2020-08-03 DIAGNOSIS — M533 Sacrococcygeal disorders, not elsewhere classified: Secondary | ICD-10-CM | POA: Diagnosis not present

## 2020-08-03 DIAGNOSIS — M159 Polyosteoarthritis, unspecified: Secondary | ICD-10-CM | POA: Diagnosis not present

## 2020-08-03 DIAGNOSIS — K219 Gastro-esophageal reflux disease without esophagitis: Secondary | ICD-10-CM | POA: Diagnosis not present

## 2020-08-03 DIAGNOSIS — I2510 Atherosclerotic heart disease of native coronary artery without angina pectoris: Secondary | ICD-10-CM | POA: Diagnosis not present

## 2020-08-03 DIAGNOSIS — E785 Hyperlipidemia, unspecified: Secondary | ICD-10-CM | POA: Diagnosis not present

## 2020-08-03 DIAGNOSIS — M109 Gout, unspecified: Secondary | ICD-10-CM | POA: Diagnosis not present

## 2020-08-03 DIAGNOSIS — E1122 Type 2 diabetes mellitus with diabetic chronic kidney disease: Secondary | ICD-10-CM | POA: Diagnosis not present

## 2020-08-03 DIAGNOSIS — I130 Hypertensive heart and chronic kidney disease with heart failure and stage 1 through stage 4 chronic kidney disease, or unspecified chronic kidney disease: Secondary | ICD-10-CM | POA: Diagnosis not present

## 2020-08-03 DIAGNOSIS — Z9181 History of falling: Secondary | ICD-10-CM | POA: Diagnosis not present

## 2020-08-03 DIAGNOSIS — I5032 Chronic diastolic (congestive) heart failure: Secondary | ICD-10-CM | POA: Diagnosis not present

## 2020-08-03 DIAGNOSIS — Z7982 Long term (current) use of aspirin: Secondary | ICD-10-CM | POA: Diagnosis not present

## 2020-08-03 DIAGNOSIS — I10 Essential (primary) hypertension: Secondary | ICD-10-CM | POA: Diagnosis not present

## 2020-08-03 DIAGNOSIS — M810 Age-related osteoporosis without current pathological fracture: Secondary | ICD-10-CM | POA: Diagnosis not present

## 2020-08-03 DIAGNOSIS — L89891 Pressure ulcer of other site, stage 1: Secondary | ICD-10-CM | POA: Diagnosis not present

## 2020-08-05 ENCOUNTER — Ambulatory Visit: Payer: Medicare Other | Admitting: Podiatry

## 2020-08-05 ENCOUNTER — Other Ambulatory Visit: Payer: Self-pay

## 2020-08-05 DIAGNOSIS — G629 Polyneuropathy, unspecified: Secondary | ICD-10-CM

## 2020-08-05 DIAGNOSIS — L98491 Non-pressure chronic ulcer of skin of other sites limited to breakdown of skin: Secondary | ICD-10-CM | POA: Diagnosis not present

## 2020-08-05 NOTE — Progress Notes (Signed)
Subjective:  Patient ID: [name], female    DOB: 23-Nov-1922,  MRN: [id number]  86 y.o. female is seen for follow up ulcer submet head 4 right foot.  Daughter is present during today's visit and accompanies her on every visit.  Her bunion area continues to feel good.  Daughter states her foot looks better today.  She wears off-loaded Darco shoe at home mostly and had new Peg Assist system applied on last visit on 06/03/2020.  Wound location: submet head 4 right foot. They deny any redness, drainage or swelling around wound.  Patient denies fever, chills, night sweats, nausea, or vomiting.  Past Medical History:  Diagnosis Date  . Anemia   . Arthritis   . Glaucoma    OU  . Gout, joint   . Hypertension   . Macular degeneration    OU  . NSTEMI (non-ST elevated myocardial infarction) ([place]) 08/2015  . Stroke [facility])      Past Surgical History:  Procedure Laterality Date  . ABDOMINAL HYSTERECTOMY    . BREAST SURGERY     CYST  . CARDIAC CATHETERIZATION N/A 08/18/2015   Procedure: Left Heart Cath and Coronary Angiography;  Surgeon: Troy Sine, MD;  Location: [place] [facility];  Service: Cardiovascular;  Laterality: N/A;  . CARDIAC CATHETERIZATION  08/18/2015   Procedure: Coronary Stent Intervention;  Surgeon: Troy Sine, MD;  Location: [place] [facility];  Service: Cardiovascular;;  . CATARACT EXTRACTION Bilateral   . DILATION AND CURETTAGE OF UTERUS    . EYE SURGERY Bilateral   . HERNIA REPAIR    . IRIDOTOMY / IRIDECTOMY Right   . JOINT REPLACEMENT  left knee  . REFRACTIVE SURGERY Right 2017     Current Outpatient Medications on File Prior to Visit  Medication Sig Dispense Refill  . Acetaminophen (TYLENOL) 325 MG CAPS Take 325 mg by mouth 3 (three) times daily as needed.    Marland Kitchen aspirin 81 MG chewable tablet Chew 81 mg by mouth daily.    Marland Kitchen atorvastatin (LIPITOR) 80 MG tablet Take 1 tablet (80 mg total) by mouth daily at 6 PM. 60 tablet 0  . atropine 1 % ophthalmic  solution Place one drop into the right eye 2 (two) times daily for 14 days. Do not take tonight.  [name] will evaluate your post-operative status tomorrow and may make changes to the duration and dose of this medication.    . bimatoprost (LUMIGAN) 0.01 % SOLN Place 1 drop into the right eye at bedtime.     . brimonidine (ALPHAGAN) 0.2 % ophthalmic solution brimonidine 0.2 % eye drops  INSTILL 1 DROP INTO BOTH EYES TWICE A DAY    . Calcium Carbonate-Vitamin D (CALTRATE 600+D PO) Take 1 tablet by mouth 2 (two) times daily.    . cephALEXin (KEFLEX) 500 MG capsule Take 500 mg by mouth 2 (two) times daily.    . chlorpheniramine (CHLOR-TRIMETON) 4 MG tablet Take 4 mg by mouth every 4 (four) hours as needed for allergies.    Marland Kitchen diclofenac sodium (VOLTAREN) 1 % GEL diclofenac 1 % topical gel  AS DIRECTED TWICE A DAY AS NEEDED TRANSDERMAL 14 DAYS    . dorzolamide-timolol (COSOPT) 22.3-6.8 MG/ML ophthalmic solution Place 1 drop into both eyes 2 (two) times daily.     Marland Kitchen doxycycline (VIBRA-TABS) 100 MG tablet Take 1 tablet (100 mg total) by mouth 2 (two) times daily. 20 tablet 0  . doxycycline (VIBRAMYCIN) 100 MG capsule Take 100 mg by  mouth 2 (two) times daily.    . famotidine (PEPCID) 20 MG tablet Take 20 mg by mouth daily.    Marland Kitchen FLUZONE HIGH-DOSE QUADRIVALENT 0.7 ML SUSY     . furosemide (LASIX) 20 MG tablet Take 20 mg by mouth as needed.     Marland Kitchen ketorolac (ACULAR) 0.5 % ophthalmic solution Place one drop into the right eye 4 (four) times daily for 28 days.    Marland Kitchen latanoprost (XALATAN) 0.005 % ophthalmic solution latanoprost 0.005 % eye drops  INSTILL 1 DROP INTO RIGHT EYE AT BEDTIME    . losartan (COZAAR) 25 MG tablet Take 50 mg by mouth daily.    Marland Kitchen losartan (COZAAR) 50 MG tablet     . metoprolol (LOPRESSOR) 50 MG tablet Take 25 mg by mouth 2 (two) times daily.    Marland Kitchen moxifloxacin (VIGAMOX) 0.5 % ophthalmic solution Apply 1 drop to eye 3 (three) times daily.    . Multiple Vitamins-Minerals (MULTIPLE  VITAMINS/WOMENS PO) Take 1 tablet by mouth daily. W/vitamin D    . MYRBETRIQ 25 MG TB24 tablet Take 25 mg by mouth daily.    Marland Kitchen neomycin-polymyxin-dexameth (MAXITROL) 0.1 % OINT Do not use tonight!   Apply a strip of ointment the size of a grain of rice to RIGHT eye at bedtime. This must be the last eye medicine of the day.   [name] will evaluate your post-operative status tomorrow and may make changes to the duration and dose of this medication.    Mckinley Jewel Dimesylate (RHOPRESSA) 0.02 % SOLN Rhopressa 0.02 % eye drops  INSTILL 1 DROP INTO AFFECTED EYE(S) BY OPHTHALMIC ROUTE ONCE DAILY INTHE EVENING    . pantoprazole (PROTONIX) 40 MG tablet Take 1 tablet (40 mg total) by mouth daily. Take 30-60 min before first meal of the day 30 tablet 2  . Polyethyl Glycol-Propyl Glycol (SYSTANE OP) Apply 1 drop to eye 2 (two) times daily.    . prednisoLONE acetate (PRED FORTE) 1 % ophthalmic suspension 1 gtt  4 times a day for one week, then 3 times a day for one week, then 2 times a day for one week, then 1 time a day for one week, then stop right eye.    . prednisoLONE acetate (PRED FORTE) 1 % ophthalmic suspension     . Zoster Vaccine Adjuvanted [facility]) injection Shingrix (PF) 50 mcg/0.5 mL intramuscular suspension, kit     No current facility-administered medications on file prior to visit.     Allergies  Allergen Reactions  . Meloxicam Hives  . Other   . Nitrofuran Derivatives Rash     Family History  Problem Relation Age of Onset  . Heart disease Mother   . Stroke Mother      Social History   Occupational History    Comment: retired  Tobacco Use  . Smoking status: Never Smoker  . Smokeless tobacco: Never Used  Substance and Sexual Activity  . Alcohol use: No    Alcohol/week: 0.0 standard drinks  . Drug use: No  . Sexual activity: Never    Birth control/protection: Post-menopausal     Immunization History  Administered Date(s) Administered  . Influenza Whole 06/10/2017   . Influenza-Unspecified 07/26/2017  . PFIZER SARS-COV-2 Vaccination 11/16/2019, 12/11/2019  . Pneumococcal Polysaccharide-23 09/28/2012   Objective:  Physical Exam: There were no vitals filed for this visit.   86 y.o. female pleasant Caucasian female in NAD. AAO x 3.  Neurovascular status unchanged b/l. Capillary fill time to digits <3  seconds b/l. Palpable DP pulses b/l. Palpable PT pulses b/l. Pedal hair absent b/l Skin temperature gradient within normal limits b/l. Nonpitting edema noted b/l LE.  Pedal skin with normal turgor, texture and tone bilaterally. No interdigital macerations bilaterally. Toenails 1-5 b/l well maintained with adequate length. No erythema, no edema, no drainage, no flocculence. No erythema, no edema, no drainage, no flocculence.   Wound Location: ball of right foot There is a moderate amount of devitalized hyperkeratotic tissue present.   Predebridement Wound Measurement: 0.3 x 0.2 cm with hyperkeratotic roof and subdermal hemorrhage Postdebridement Wound Measurement: 0.3 x 0.2 x 0.1 cm and is partial thickness submet head 3 right foot.   Wound Base:  Epithelial Peri-wound: Calloused Exudate: None: wound tissue dry Blood Loss during debridement: 0 cc's. Description of tissue removed from ulceration today:  Hyperkeratosis and devitalized subcutaneous tissue. Signs of clinical bacterial infection are absent. Material in wound which inhibits healing/promotes adjacent tissue breakdown: Hyperkeratosis  Normal muscle strength 5/5 to all lower extremity muscle groups bilaterally. No pain crepitus or joint limitation noted with ROM b/l. Plantarflexed metatarsal(s) right lower extremity. Hallux valgus with bunion deformity noted b/l lower extremities. Hammertoes noted to the 2-5 bilaterally. Noted plantar fat pad atrophy of forefoot area b/l lower extremities.  Protective sensation diminished with 10g monofilament b/l. Proprioception intact bilaterally.  Assessment:    1. Neurogenic ulcer, limited to breakdown of skin ([place])   2. Neuropathy    Plan:  -Patient was evaluated and treated and all questions answered.  -Porokeratotic lesion right 1st metatarsal head: continue daily use of tubefoam bunion shield. -Patient/POA/Family member educated on diagnosis and treatment plan of routine ulcer debridement/wound care.  -Type/amount of devitalized tissue removed: devitalized hyperkeratotic tissue. -Today's postdebridement measurements: 0.3 x 0.2 x 0.1 cm submet head 3 right foot ulcer size post-debridement: Ulcer cleansed with wound cleanser. Triple antibiotic ointment and band-aid applied. -New Peg Assist was added to Darco shoe right foot on 06/03/2020. -Wound responded well to today's debridement. -Patient risk factors affecting healing of ulcer: foot deformity, neuropathy CKD, fat pad atrophy b/l feet, recurrent ulceration. -Daughter is well experienced/educated onf the chronic ulceration. She was instructed to apply Iodosorb Gel to wounds once daily if they start to drain. If it opens and does not drain, she may apply the Medihoney. -Frequency of debridement needed to achieve healing: every 2-3 weeks to prevent breakdown.Daughter knows to call office for myself or Dr. Jacqualyn Posey if condition worsens. -Call office if condition worsens or Ms. Mcdonnell experiences any increased redness, drainage, swelling, warmth of right foot, fever, chills, night sweats, nausea, vomiting  Return in about 3 weeks (around 08/26/2020) for callus trim/preulcerative, at risk foot care.  Marzetta Board, DPM

## 2020-08-06 DIAGNOSIS — I69354 Hemiplegia and hemiparesis following cerebral infarction affecting left non-dominant side: Secondary | ICD-10-CM | POA: Diagnosis not present

## 2020-08-06 DIAGNOSIS — E1122 Type 2 diabetes mellitus with diabetic chronic kidney disease: Secondary | ICD-10-CM | POA: Diagnosis not present

## 2020-08-06 DIAGNOSIS — I270 Primary pulmonary hypertension: Secondary | ICD-10-CM | POA: Diagnosis not present

## 2020-08-06 DIAGNOSIS — L89891 Pressure ulcer of other site, stage 1: Secondary | ICD-10-CM | POA: Diagnosis not present

## 2020-08-06 DIAGNOSIS — Z9181 History of falling: Secondary | ICD-10-CM | POA: Diagnosis not present

## 2020-08-06 DIAGNOSIS — Z87440 Personal history of urinary (tract) infections: Secondary | ICD-10-CM | POA: Diagnosis not present

## 2020-08-06 DIAGNOSIS — I69393 Ataxia following cerebral infarction: Secondary | ICD-10-CM | POA: Diagnosis not present

## 2020-08-06 DIAGNOSIS — I5032 Chronic diastolic (congestive) heart failure: Secondary | ICD-10-CM | POA: Diagnosis not present

## 2020-08-06 DIAGNOSIS — W19XXXS Unspecified fall, sequela: Secondary | ICD-10-CM | POA: Diagnosis not present

## 2020-08-06 DIAGNOSIS — M533 Sacrococcygeal disorders, not elsewhere classified: Secondary | ICD-10-CM | POA: Diagnosis not present

## 2020-08-06 DIAGNOSIS — N183 Chronic kidney disease, stage 3 unspecified: Secondary | ICD-10-CM | POA: Diagnosis not present

## 2020-08-06 DIAGNOSIS — Z96652 Presence of left artificial knee joint: Secondary | ICD-10-CM | POA: Diagnosis not present

## 2020-08-06 DIAGNOSIS — M109 Gout, unspecified: Secondary | ICD-10-CM | POA: Diagnosis not present

## 2020-08-06 DIAGNOSIS — I130 Hypertensive heart and chronic kidney disease with heart failure and stage 1 through stage 4 chronic kidney disease, or unspecified chronic kidney disease: Secondary | ICD-10-CM | POA: Diagnosis not present

## 2020-08-06 DIAGNOSIS — M810 Age-related osteoporosis without current pathological fracture: Secondary | ICD-10-CM | POA: Diagnosis not present

## 2020-08-06 DIAGNOSIS — E538 Deficiency of other specified B group vitamins: Secondary | ICD-10-CM | POA: Diagnosis not present

## 2020-08-06 DIAGNOSIS — I2510 Atherosclerotic heart disease of native coronary artery without angina pectoris: Secondary | ICD-10-CM | POA: Diagnosis not present

## 2020-08-06 DIAGNOSIS — M159 Polyosteoarthritis, unspecified: Secondary | ICD-10-CM | POA: Diagnosis not present

## 2020-08-06 DIAGNOSIS — Z7982 Long term (current) use of aspirin: Secondary | ICD-10-CM | POA: Diagnosis not present

## 2020-08-07 DIAGNOSIS — L03116 Cellulitis of left lower limb: Secondary | ICD-10-CM | POA: Diagnosis not present

## 2020-08-07 DIAGNOSIS — N390 Urinary tract infection, site not specified: Secondary | ICD-10-CM | POA: Diagnosis not present

## 2020-08-08 ENCOUNTER — Encounter: Payer: Self-pay | Admitting: Podiatry

## 2020-08-10 DIAGNOSIS — E1122 Type 2 diabetes mellitus with diabetic chronic kidney disease: Secondary | ICD-10-CM | POA: Diagnosis not present

## 2020-08-10 DIAGNOSIS — I5032 Chronic diastolic (congestive) heart failure: Secondary | ICD-10-CM | POA: Diagnosis not present

## 2020-08-10 DIAGNOSIS — Z96652 Presence of left artificial knee joint: Secondary | ICD-10-CM | POA: Diagnosis not present

## 2020-08-10 DIAGNOSIS — Z87440 Personal history of urinary (tract) infections: Secondary | ICD-10-CM | POA: Diagnosis not present

## 2020-08-10 DIAGNOSIS — L89891 Pressure ulcer of other site, stage 1: Secondary | ICD-10-CM | POA: Diagnosis not present

## 2020-08-10 DIAGNOSIS — M810 Age-related osteoporosis without current pathological fracture: Secondary | ICD-10-CM | POA: Diagnosis not present

## 2020-08-10 DIAGNOSIS — I130 Hypertensive heart and chronic kidney disease with heart failure and stage 1 through stage 4 chronic kidney disease, or unspecified chronic kidney disease: Secondary | ICD-10-CM | POA: Diagnosis not present

## 2020-08-10 DIAGNOSIS — I270 Primary pulmonary hypertension: Secondary | ICD-10-CM | POA: Diagnosis not present

## 2020-08-10 DIAGNOSIS — W19XXXS Unspecified fall, sequela: Secondary | ICD-10-CM | POA: Diagnosis not present

## 2020-08-10 DIAGNOSIS — Z9181 History of falling: Secondary | ICD-10-CM | POA: Diagnosis not present

## 2020-08-10 DIAGNOSIS — I69393 Ataxia following cerebral infarction: Secondary | ICD-10-CM | POA: Diagnosis not present

## 2020-08-10 DIAGNOSIS — M159 Polyosteoarthritis, unspecified: Secondary | ICD-10-CM | POA: Diagnosis not present

## 2020-08-10 DIAGNOSIS — N183 Chronic kidney disease, stage 3 unspecified: Secondary | ICD-10-CM | POA: Diagnosis not present

## 2020-08-10 DIAGNOSIS — M533 Sacrococcygeal disorders, not elsewhere classified: Secondary | ICD-10-CM | POA: Diagnosis not present

## 2020-08-10 DIAGNOSIS — I69354 Hemiplegia and hemiparesis following cerebral infarction affecting left non-dominant side: Secondary | ICD-10-CM | POA: Diagnosis not present

## 2020-08-10 DIAGNOSIS — M109 Gout, unspecified: Secondary | ICD-10-CM | POA: Diagnosis not present

## 2020-08-10 DIAGNOSIS — Z7982 Long term (current) use of aspirin: Secondary | ICD-10-CM | POA: Diagnosis not present

## 2020-08-10 DIAGNOSIS — E538 Deficiency of other specified B group vitamins: Secondary | ICD-10-CM | POA: Diagnosis not present

## 2020-08-10 DIAGNOSIS — I2510 Atherosclerotic heart disease of native coronary artery without angina pectoris: Secondary | ICD-10-CM | POA: Diagnosis not present

## 2020-08-10 NOTE — Progress Notes (Signed)
[facility] Note  08/12/2020     CHIEF COMPLAINT Patient presents for Retina Follow Up   HISTORY OF PRESENT ILLNESS: [name] is a 86 y.o. female who presents to the clinic today for:  HPI    Retina Follow Up    Patient presents with  CRVO/BRVO.  In right eye.  This started months ago.  Severity is severe.  Duration of 4 weeks.  Since onset it is stable.  I, the attending physician,  performed the HPI with the patient and updated documentation appropriately.          Comments    86 y/o female pt here for 4 wk f/u for inferior HRVO w/CME OD.  No change in [place] OU.  Denies pain, FOL, floaters.  Latanoprost QHS OD, Dorzolamide/Timolol BID OU, Brimonidine BID OU.       Last edited by Bernarda Caffey, MD on 08/14/2020 12:10 AM. (History)      Referring physician: Jani Gravel, MD [address] [place],  [place] [id number]  HISTORICAL INFORMATION:   Selected notes from the MEDICAL RECORD NUMBER Referred by Dr. Midge Aver for concern of RVO OS   CURRENT MEDICATIONS: Current Outpatient Medications (Ophthalmic Drugs)  Medication Sig  . atropine 1 % ophthalmic solution Place one drop into the right eye 2 (two) times daily for 14 days. Do not take tonight.  [name] will evaluate your post-operative status tomorrow and may make changes to the duration and dose of this medication.  . bimatoprost (LUMIGAN) 0.01 % SOLN Place 1 drop into the right eye at bedtime.   . brimonidine (ALPHAGAN) 0.2 % ophthalmic solution brimonidine 0.2 % eye drops  INSTILL 1 DROP INTO BOTH EYES TWICE A DAY  . dorzolamide-timolol (COSOPT) 22.3-6.8 MG/ML ophthalmic solution Place 1 drop into both eyes 2 (two) times daily.   Marland Kitchen ketorolac (ACULAR) 0.5 % ophthalmic solution Place one drop into the right eye 4 (four) times daily for 28 days.  Marland Kitchen latanoprost (XALATAN) 0.005 % ophthalmic solution latanoprost 0.005 % eye drops  INSTILL 1 DROP INTO RIGHT EYE AT BEDTIME  .  moxifloxacin (VIGAMOX) 0.5 % ophthalmic solution Apply 1 drop to eye 3 (three) times daily.  Marland Kitchen neomycin-polymyxin-dexameth (MAXITROL) 0.1 % OINT Do not use tonight!   Apply a strip of ointment the size of a grain of rice to RIGHT eye at bedtime. This must be the last eye medicine of the day.   [name] will evaluate your post-operative status tomorrow and may make changes to the duration and dose of this medication.  Mckinley Jewel Dimesylate (RHOPRESSA) 0.02 % SOLN Rhopressa 0.02 % eye drops  INSTILL 1 DROP INTO AFFECTED EYE(S) BY OPHTHALMIC ROUTE ONCE DAILY INTHE EVENING  . Polyethyl Glycol-Propyl Glycol (SYSTANE OP) Apply 1 drop to eye 2 (two) times daily.  . prednisoLONE acetate (PRED FORTE) 1 % ophthalmic suspension 1 gtt  4 times a day for one week, then 3 times a day for one week, then 2 times a day for one week, then 1 time a day for one week, then stop right eye.  . prednisoLONE acetate (PRED FORTE) 1 % ophthalmic suspension    No current facility-administered medications for this visit. (Ophthalmic Drugs)   Current Outpatient Medications (Other)  Medication Sig  . Acetaminophen (TYLENOL) 325 MG CAPS Take 325 mg by mouth 3 (three) times daily as needed.  Marland Kitchen aspirin 81 MG chewable tablet Chew 81 mg by mouth daily.  Marland Kitchen  atorvastatin (LIPITOR) 80 MG tablet Take 1 tablet (80 mg total) by mouth daily at 6 PM.  . Calcium Carbonate-Vitamin D (CALTRATE 600+D PO) Take 1 tablet by mouth 2 (two) times daily.  . cephALEXin (KEFLEX) 500 MG capsule Take 500 mg by mouth 2 (two) times daily.  . chlorpheniramine (CHLOR-TRIMETON) 4 MG tablet Take 4 mg by mouth every 4 (four) hours as needed for allergies.  Marland Kitchen diclofenac sodium (VOLTAREN) 1 % GEL diclofenac 1 % topical gel  AS DIRECTED TWICE A DAY AS NEEDED TRANSDERMAL 14 DAYS  . doxycycline (VIBRA-TABS) 100 MG tablet Take 1 tablet (100 mg total) by mouth 2 (two) times daily.  Marland Kitchen doxycycline (VIBRAMYCIN) 100 MG capsule Take 100 mg by mouth 2 (two) times  daily.  . famotidine (PEPCID) 20 MG tablet Take 20 mg by mouth daily.  Marland Kitchen FLUZONE HIGH-DOSE QUADRIVALENT 0.7 ML SUSY   . furosemide (LASIX) 20 MG tablet Take 20 mg by mouth as needed.   Marland Kitchen losartan (COZAAR) 25 MG tablet Take 50 mg by mouth daily.  Marland Kitchen losartan (COZAAR) 50 MG tablet   . metoprolol (LOPRESSOR) 50 MG tablet Take 25 mg by mouth 2 (two) times daily.  . Multiple Vitamins-Minerals (MULTIPLE VITAMINS/WOMENS PO) Take 1 tablet by mouth daily. W/vitamin D  . MYRBETRIQ 25 MG TB24 tablet Take 25 mg by mouth daily.  . pantoprazole (PROTONIX) 40 MG tablet Take 1 tablet (40 mg total) by mouth daily. Take 30-60 min before first meal of the day  . Zoster Vaccine Adjuvanted [facility]) injection Shingrix (PF) 50 mcg/0.5 mL intramuscular suspension, kit   No current facility-administered medications for this visit. (Other)      REVIEW OF SYSTEMS: ROS    Positive for: Neurological, Genitourinary, Musculoskeletal, Cardiovascular, Eyes   Negative for: Constitutional, Gastrointestinal, Skin, HENT, Endocrine, Respiratory, Psychiatric, Allergic/Imm, Heme/Lymph   Last edited by Matthew Folks, COA on 08/12/2020  9:24 AM. (History)       ALLERGIES Allergies  Allergen Reactions  . Meloxicam Hives  . Other   . Nitrofuran Derivatives Rash    PAST MEDICAL HISTORY Past Medical History:  Diagnosis Date  . Anemia   . Arthritis   . Glaucoma    OU  . Gout, joint   . Hypertension   . Macular degeneration    OU  . NSTEMI (non-ST elevated myocardial infarction) ([place]) 08/2015  . Stroke [facility])    Past Surgical History:  Procedure Laterality Date  . ABDOMINAL HYSTERECTOMY    . BREAST SURGERY     CYST  . CARDIAC CATHETERIZATION N/A 08/18/2015   Procedure: Left Heart Cath and Coronary Angiography;  Surgeon: Troy Sine, MD;  Location: [facility];  Service: Cardiovascular;  Laterality: N/A;  . CARDIAC CATHETERIZATION  08/18/2015   Procedure: Coronary Stent Intervention;  Surgeon:  Troy Sine, MD;  Location: [facility];  Service: Cardiovascular;;  . CATARACT EXTRACTION Bilateral   . DILATION AND CURETTAGE OF UTERUS    . EYE SURGERY Bilateral   . HERNIA REPAIR    . IRIDOTOMY / IRIDECTOMY Right   . JOINT REPLACEMENT  left knee  . REFRACTIVE SURGERY Right 2017    FAMILY HISTORY Family History  Problem Relation Age of Onset  . Heart disease Mother   . Stroke Mother     SOCIAL HISTORY Social History   Tobacco Use  . Smoking status: Never Smoker  . Smokeless tobacco: Never Used  Substance Use Topics  . Alcohol use: No  Alcohol/week: 0.0 standard drinks  . Drug use: No         OPHTHALMIC EXAM:  Base Eye Exam    Visual Acuity (Snellen - Linear)      Right Left   Dist [place] CF @ 1' 20/30 -2   Dist ph [place] NI NI       Tonometry (Tonopen, 9:26 AM)      Right Left   Pressure 10 10       Pupils      Dark Light Shape React APD   Right 3 2 Round Minimal None   Left 3 2 Round Minimal None       Visual Fields (Counting fingers)      Left Right    Full    Restrictions  Total superior temporal, inferior temporal, superior nasal deficiencies       Extraocular Movement      Right Left    Full, Ortho Full, Ortho       Neuro/Psych    Oriented x3: Yes   Mood/Affect: Normal       Dilation    Both eyes: 1.0% Mydriacyl, 2.5% Phenylephrine @ 9:26 AM        Slit Lamp and Fundus Exam    Slit Lamp Exam      Right Left   Lids/Lashes Dermatochalasis - upper lid, Telangiectasia, Meibomian gland dysfunction Dermatochalasis - upper lid, mild Meibomian gland dysfunction   Conjunctiva/Sclera Trace Injection Trace Injection   Cornea Arcus, 3+ Punctate epithelial erosions, Well healed cataract wounds, mild Debris in tear film Arcus, 2-3+ inferior Punctate epithelial erosions, Well healed cataract wounds   Anterior Chamber Deep and quiet Deep and quiet   Iris Round and poorly dilated to 39m, No NVI Round and moderately dilated to 564m  Lens PC  IOL in good position with open PC PC IOL in good position with open PC   Vitreous Vitreous syneresis, Posterior vitreous detachment, vitreous condensations Vitreous syneresis, Posterior vitreous detachment       Fundus Exam      Right Left   Disc Tilted disc, thin inferior rim, +cupping, 3+pallor, 360 PPA, inferior vascular loops 1+ Pallor, Sharp rim, +cupping, temporal Peripapillary atrophy   C/D Ratio 0.9 0.6   Macula Blunted foveal reflex, inferior edema and IRH, central pigment clumping--slightly improved from prior Flat, Blunted foveal reflex, RPE mottling and clumping, No heme or edema   Vessels Vascular attenuation, Tortuous Vascular attenuation, mild Tortuousity   Periphery Attached, inferior IRH/DBH  Attached, No heme           IMAGING AND PROCEDURES  Imaging and Procedures for '@TODAY' @  OCT, Retina - OU - Both Eyes       Right Eye Quality was poor. Central Foveal Thickness: 263. Progression has improved. Findings include subretinal hyper-reflective material, intraretinal fluid, intraretinal hyper-reflective material, epiretinal membrane, no SRF, outer retinal atrophy, abnormal foveal contour (Poor image quality -- Interval improvement in CME inferior macula).   Left Eye Quality was borderline. Central Foveal Thickness: 253. Progression has been stable. Findings include normal foveal contour, no IRF, no SRF, retinal drusen .   Notes *Images captured and stored on drive  Diagnosis / Impression:  OD: Poor image quality -- Interval improvement in CME inferior macula OS: NFP, no IRF/SRF, +drusen  Clinical management:  See below  Abbreviations: NFP - Normal foveal profile. CME - cystoid macular edema. PED - pigment epithelial detachment. IRF - intraretinal fluid. SRF - subretinal fluid. EZ - ellipsoid zone.  ERM - epiretinal membrane. ORA - outer retinal atrophy. ORT - outer retinal tubulation. SRHM - subretinal hyper-reflective material        Intravitreal Injection,  Pharmacologic Agent - OD - Right Eye       Time Out 08/12/2020. 10:22 AM. Confirmed correct patient, procedure, site, and patient consented.   Anesthesia Topical anesthesia was used. Anesthetic medications included Lidocaine 2%, Proparacaine 0.5%.   Procedure Preparation included eyelid speculum, 5% betadine to ocular surface. A supplied needle was used.   Injection:  1.25 mg Bevacizumab (AVASTIN) SOLN   NDC: 13086-578-46, Lot: [id number]'@28' , Expiration date: 08/18/2020   Route: Intravitreal, Site: Right Eye, Waste: 0 mL  Post-op Post injection exam found visual acuity of at least counting fingers. The patient tolerated the procedure well. There were no complications. The patient received written and verbal post procedure care education.                 ASSESSMENT/PLAN:    ICD-10-CM   1. Branch retinal vein occlusion of right eye with macular edema  H34.8310 Intravitreal Injection, Pharmacologic Agent - OD - Right Eye    Bevacizumab (AVASTIN) SOLN 1.25 mg  2. Retinal edema  H35.81 OCT, Retina - OU - Both Eyes  3. Exudative age-related macular degeneration of right eye with active choroidal neovascularization ([place])  H35.3211   4. Intermediate stage nonexudative age-related macular degeneration of left eye  H35.3122   5. Essential hypertension  I10   6. Hypertensive retinopathy of both eyes  H35.033   7. Primary open angle glaucoma (POAG) of both eyes, severe stage  H40.1133   8. Pseudophakia of both eyes  Z96.1     1,2. Inferior HRVO w/ CME, OD  - pt lost to f/u from 10/16/2019 to 07/14/2020 (9 mos)  - presenting BCVA CF 2' (09.14.20)  - pt subjectively reports decline in vision OD since Feb 2020  - presenting OCT shows severe CME/IRF temporal macula, +SRF overlying low PED (?exudative ARMD component)  - S/p IVA OD #1 09.14.20, #2 (10.20.20), #3 (12.02.20), #4 (01.06.21), #5 (10.05.21)  - OCT -- Poor image quality -- Interval improvement in CME inferior macula  - discussed  findings, poor prognosis and treatment options             - Recommend IVA OD #6 today, 11.03.21 for CME and prevention of further damage; discussed limited benefit to visual acuity OD  - pt wishes to proceed  - RBA of procedure discussed, questions answered  - informed consent obtained and signed  - see procedure note  - f/u 4 weeks, DFE, OCT, possible injection  3. Exudative age related macular degeneration, OD    - initial OCT showed +IRF/SRF -- SRF overlying low lying PED  - exam shows +CNVM with surrounding heme -- improved today  - suspect exudative ARMD component complicating HRVO w/ CME  - S/p IVA OD #1 (09.14.20), #2 (10.20.20), #3 (12.02.20), #4 (01.06.21), #5 (10.05.21)  - recommend IVA OD #6 as above  - f/u in 4 weeks  4. Age related macular degeneration, non-exudative, OS  - The incidence, anatomy, and pathology of dry AMD, risk of progression, and the AREDS and AREDS 2 study including smoking risks discussed with patient.  - Recommend [place] grid monitoring  5,6. Hypertensive retinopathy OU  - discussed importance of tight BP control  - monitor  7. POAG OU -- severe stage  - under the expert management of Dr. 23.05.21  - s/p SLT OD 3.23.2017  -  s/p TDC OD w/ [name], 3.30.21  - IOP good today at 10,10  - pt on latnanoprost qhs OD, dorzolamide/timolol BID OU, brimonidine bid OU  8. Pseudophakia OU  - s/p CE/IOL OU  - beautiful surgeries, doing well  - monitor   Ophthalmic Meds Ordered this visit:  Meds ordered this encounter  Medications  . Bevacizumab (AVASTIN) SOLN 1.25 mg       Return in about 4 weeks (around 09/09/2020) for 4 wk f/u for HRVO w/CME OD.  There are no Patient Instructions on file for this visit.   Explained the diagnoses, plan, and follow up with the patient and they expressed understanding.  Patient expressed understanding of the importance of proper follow up care.   This document serves as a record of services personally  performed by Gardiner Sleeper, MD, PhD. It was created on their behalf by Roselee Nova, COMT. The creation of this record is the provider's dictation and/or activities during the visit.  Electronically signed by: Roselee Nova, COMT 08/14/20 12:14 AM   This document serves as a record of services personally performed by Gardiner Sleeper, MD, PhD. It was created on their behalf by San Jetty. Owens Shark, OA an ophthalmic technician. The creation of this record is the provider's dictation and/or activities during the visit.    Electronically signed by: San Jetty. Owens Shark, [place] 11.03.2021 12:14 AM  This document serves as a record of services personally performed by Gardiner Sleeper, MD, PhD. It was created on their behalf by Estill Bakes, COT an ophthalmic technician. The creation of this record is the provider's dictation and/or activities during the visit.    Electronically signed by: Estill Bakes, COT 11.3.21 @ 12:14 AM  Gardiner Sleeper, M.D., Ph.D. Diseases & Surgery of the Retina and [place] 11.3.21  I have reviewed the above documentation for accuracy and completeness, and I agree with the above. Gardiner Sleeper, M.D., Ph.D. 08/14/20 12:14 AM   Abbreviations: M myopia (nearsighted); A astigmatism; H hyperopia (farsighted); P presbyopia; Mrx spectacle prescription;  CTL contact lenses; OD right eye; OS left eye; OU both eyes  XT exotropia; ET esotropia; PEK punctate epithelial keratitis; PEE punctate epithelial erosions; DES dry eye syndrome; MGD meibomian gland dysfunction; ATs artificial tears; PFAT's preservative free artificial tears; [place] nuclear sclerotic cataract; PSC posterior subcapsular cataract; ERM epi-retinal membrane; PVD posterior vitreous detachment; RD retinal detachment; DM diabetes mellitus; DR diabetic retinopathy; NPDR non-proliferative diabetic retinopathy; PDR proliferative diabetic retinopathy; CSME clinically significant macular edema; DME  diabetic macular edema; dbh dot blot hemorrhages; CWS cotton wool spot; POAG primary open angle glaucoma; C/D cup-to-disc ratio; HVF humphrey visual field; GVF goldmann visual field; OCT optical coherence tomography; IOP intraocular pressure; BRVO Branch retinal vein occlusion; CRVO central retinal vein occlusion; CRAO central retinal artery occlusion; BRAO branch retinal artery occlusion; RT retinal tear; SB scleral buckle; PPV pars plana vitrectomy; VH Vitreous hemorrhage; PRP panretinal laser photocoagulation; IVK intravitreal kenalog; VMT vitreomacular traction; MH Macular hole;  NVD neovascularization of the disc; NVE neovascularization elsewhere; AREDS age related eye disease study; ARMD age related macular degeneration; POAG primary open angle glaucoma; EBMD epithelial/anterior basement membrane dystrophy; ACIOL anterior chamber intraocular lens; IOL intraocular lens; PCIOL posterior chamber intraocular lens; Phaco/IOL phacoemulsification with intraocular lens placement; [place] photorefractive keratectomy; LASIK laser assisted in situ keratomileusis; HTN hypertension; DM diabetes mellitus; COPD chronic obstructive pulmonary disease

## 2020-08-12 ENCOUNTER — Ambulatory Visit (INDEPENDENT_AMBULATORY_CARE_PROVIDER_SITE_OTHER): Payer: Medicare Other | Admitting: Ophthalmology

## 2020-08-12 ENCOUNTER — Encounter (INDEPENDENT_AMBULATORY_CARE_PROVIDER_SITE_OTHER): Payer: Self-pay | Admitting: Ophthalmology

## 2020-08-12 ENCOUNTER — Other Ambulatory Visit: Payer: Self-pay

## 2020-08-12 DIAGNOSIS — H353211 Exudative age-related macular degeneration, right eye, with active choroidal neovascularization: Secondary | ICD-10-CM

## 2020-08-12 DIAGNOSIS — H353122 Nonexudative age-related macular degeneration, left eye, intermediate dry stage: Secondary | ICD-10-CM | POA: Diagnosis not present

## 2020-08-12 DIAGNOSIS — H34831 Tributary (branch) retinal vein occlusion, right eye, with macular edema: Secondary | ICD-10-CM | POA: Diagnosis not present

## 2020-08-12 DIAGNOSIS — H3581 Retinal edema: Secondary | ICD-10-CM

## 2020-08-12 DIAGNOSIS — Z961 Presence of intraocular lens: Secondary | ICD-10-CM

## 2020-08-12 DIAGNOSIS — H35033 Hypertensive retinopathy, bilateral: Secondary | ICD-10-CM

## 2020-08-12 DIAGNOSIS — I10 Essential (primary) hypertension: Secondary | ICD-10-CM

## 2020-08-12 DIAGNOSIS — H401133 Primary open-angle glaucoma, bilateral, severe stage: Secondary | ICD-10-CM

## 2020-08-14 ENCOUNTER — Encounter (INDEPENDENT_AMBULATORY_CARE_PROVIDER_SITE_OTHER): Payer: Self-pay | Admitting: Ophthalmology

## 2020-08-14 DIAGNOSIS — M533 Sacrococcygeal disorders, not elsewhere classified: Secondary | ICD-10-CM | POA: Diagnosis not present

## 2020-08-14 DIAGNOSIS — I5032 Chronic diastolic (congestive) heart failure: Secondary | ICD-10-CM | POA: Diagnosis not present

## 2020-08-14 DIAGNOSIS — I2510 Atherosclerotic heart disease of native coronary artery without angina pectoris: Secondary | ICD-10-CM | POA: Diagnosis not present

## 2020-08-14 DIAGNOSIS — Z7982 Long term (current) use of aspirin: Secondary | ICD-10-CM | POA: Diagnosis not present

## 2020-08-14 DIAGNOSIS — Z87440 Personal history of urinary (tract) infections: Secondary | ICD-10-CM | POA: Diagnosis not present

## 2020-08-14 DIAGNOSIS — E1122 Type 2 diabetes mellitus with diabetic chronic kidney disease: Secondary | ICD-10-CM | POA: Diagnosis not present

## 2020-08-14 DIAGNOSIS — H34831 Tributary (branch) retinal vein occlusion, right eye, with macular edema: Secondary | ICD-10-CM | POA: Diagnosis not present

## 2020-08-14 DIAGNOSIS — I130 Hypertensive heart and chronic kidney disease with heart failure and stage 1 through stage 4 chronic kidney disease, or unspecified chronic kidney disease: Secondary | ICD-10-CM | POA: Diagnosis not present

## 2020-08-14 DIAGNOSIS — I270 Primary pulmonary hypertension: Secondary | ICD-10-CM | POA: Diagnosis not present

## 2020-08-14 DIAGNOSIS — I69354 Hemiplegia and hemiparesis following cerebral infarction affecting left non-dominant side: Secondary | ICD-10-CM | POA: Diagnosis not present

## 2020-08-14 DIAGNOSIS — I10 Essential (primary) hypertension: Secondary | ICD-10-CM | POA: Diagnosis not present

## 2020-08-14 DIAGNOSIS — W19XXXS Unspecified fall, sequela: Secondary | ICD-10-CM | POA: Diagnosis not present

## 2020-08-14 DIAGNOSIS — I69393 Ataxia following cerebral infarction: Secondary | ICD-10-CM | POA: Diagnosis not present

## 2020-08-14 DIAGNOSIS — L89891 Pressure ulcer of other site, stage 1: Secondary | ICD-10-CM | POA: Diagnosis not present

## 2020-08-14 DIAGNOSIS — M159 Polyosteoarthritis, unspecified: Secondary | ICD-10-CM | POA: Diagnosis not present

## 2020-08-14 DIAGNOSIS — E538 Deficiency of other specified B group vitamins: Secondary | ICD-10-CM | POA: Diagnosis not present

## 2020-08-14 DIAGNOSIS — M810 Age-related osteoporosis without current pathological fracture: Secondary | ICD-10-CM | POA: Diagnosis not present

## 2020-08-14 DIAGNOSIS — Z96652 Presence of left artificial knee joint: Secondary | ICD-10-CM | POA: Diagnosis not present

## 2020-08-14 DIAGNOSIS — N183 Chronic kidney disease, stage 3 unspecified: Secondary | ICD-10-CM | POA: Diagnosis not present

## 2020-08-14 DIAGNOSIS — Z9181 History of falling: Secondary | ICD-10-CM | POA: Diagnosis not present

## 2020-08-14 DIAGNOSIS — M109 Gout, unspecified: Secondary | ICD-10-CM | POA: Diagnosis not present

## 2020-08-14 MED ORDER — BEVACIZUMAB CHEMO INJECTION 1.25MG/0.05ML SYRINGE FOR KALEIDOSCOPE
1.2500 mg | INTRAVITREAL | Status: AC | PRN
  Administered 2020-08-14: 1.25 mg via INTRAVITREAL

## 2020-08-19 DIAGNOSIS — N183 Chronic kidney disease, stage 3 unspecified: Secondary | ICD-10-CM | POA: Diagnosis not present

## 2020-08-19 DIAGNOSIS — I2510 Atherosclerotic heart disease of native coronary artery without angina pectoris: Secondary | ICD-10-CM | POA: Diagnosis not present

## 2020-08-19 DIAGNOSIS — E538 Deficiency of other specified B group vitamins: Secondary | ICD-10-CM | POA: Diagnosis not present

## 2020-08-19 DIAGNOSIS — M810 Age-related osteoporosis without current pathological fracture: Secondary | ICD-10-CM | POA: Diagnosis not present

## 2020-08-19 DIAGNOSIS — Z96652 Presence of left artificial knee joint: Secondary | ICD-10-CM | POA: Diagnosis not present

## 2020-08-19 DIAGNOSIS — W19XXXS Unspecified fall, sequela: Secondary | ICD-10-CM | POA: Diagnosis not present

## 2020-08-19 DIAGNOSIS — M159 Polyosteoarthritis, unspecified: Secondary | ICD-10-CM | POA: Diagnosis not present

## 2020-08-19 DIAGNOSIS — Z7982 Long term (current) use of aspirin: Secondary | ICD-10-CM | POA: Diagnosis not present

## 2020-08-19 DIAGNOSIS — L89891 Pressure ulcer of other site, stage 1: Secondary | ICD-10-CM | POA: Diagnosis not present

## 2020-08-19 DIAGNOSIS — I5032 Chronic diastolic (congestive) heart failure: Secondary | ICD-10-CM | POA: Diagnosis not present

## 2020-08-19 DIAGNOSIS — Z87440 Personal history of urinary (tract) infections: Secondary | ICD-10-CM | POA: Diagnosis not present

## 2020-08-19 DIAGNOSIS — M533 Sacrococcygeal disorders, not elsewhere classified: Secondary | ICD-10-CM | POA: Diagnosis not present

## 2020-08-19 DIAGNOSIS — Z9181 History of falling: Secondary | ICD-10-CM | POA: Diagnosis not present

## 2020-08-19 DIAGNOSIS — I69393 Ataxia following cerebral infarction: Secondary | ICD-10-CM | POA: Diagnosis not present

## 2020-08-19 DIAGNOSIS — I270 Primary pulmonary hypertension: Secondary | ICD-10-CM | POA: Diagnosis not present

## 2020-08-19 DIAGNOSIS — E1122 Type 2 diabetes mellitus with diabetic chronic kidney disease: Secondary | ICD-10-CM | POA: Diagnosis not present

## 2020-08-19 DIAGNOSIS — I130 Hypertensive heart and chronic kidney disease with heart failure and stage 1 through stage 4 chronic kidney disease, or unspecified chronic kidney disease: Secondary | ICD-10-CM | POA: Diagnosis not present

## 2020-08-19 DIAGNOSIS — I69354 Hemiplegia and hemiparesis following cerebral infarction affecting left non-dominant side: Secondary | ICD-10-CM | POA: Diagnosis not present

## 2020-08-19 DIAGNOSIS — M109 Gout, unspecified: Secondary | ICD-10-CM | POA: Diagnosis not present

## 2020-08-26 ENCOUNTER — Ambulatory Visit: Payer: Medicare Other | Admitting: Podiatry

## 2020-08-26 ENCOUNTER — Other Ambulatory Visit: Payer: Self-pay

## 2020-08-26 DIAGNOSIS — L02619 Cutaneous abscess of unspecified foot: Secondary | ICD-10-CM | POA: Diagnosis not present

## 2020-08-26 DIAGNOSIS — M79674 Pain in right toe(s): Secondary | ICD-10-CM

## 2020-08-26 DIAGNOSIS — B351 Tinea unguium: Secondary | ICD-10-CM | POA: Diagnosis not present

## 2020-08-26 DIAGNOSIS — M79675 Pain in left toe(s): Secondary | ICD-10-CM

## 2020-08-26 DIAGNOSIS — I69393 Ataxia following cerebral infarction: Secondary | ICD-10-CM | POA: Diagnosis not present

## 2020-08-26 DIAGNOSIS — I130 Hypertensive heart and chronic kidney disease with heart failure and stage 1 through stage 4 chronic kidney disease, or unspecified chronic kidney disease: Secondary | ICD-10-CM | POA: Diagnosis not present

## 2020-08-26 DIAGNOSIS — L03119 Cellulitis of unspecified part of limb: Secondary | ICD-10-CM | POA: Diagnosis not present

## 2020-08-26 DIAGNOSIS — Z87440 Personal history of urinary (tract) infections: Secondary | ICD-10-CM | POA: Diagnosis not present

## 2020-08-26 DIAGNOSIS — I69354 Hemiplegia and hemiparesis following cerebral infarction affecting left non-dominant side: Secondary | ICD-10-CM | POA: Diagnosis not present

## 2020-08-26 DIAGNOSIS — I5032 Chronic diastolic (congestive) heart failure: Secondary | ICD-10-CM | POA: Diagnosis not present

## 2020-08-26 DIAGNOSIS — M533 Sacrococcygeal disorders, not elsewhere classified: Secondary | ICD-10-CM | POA: Diagnosis not present

## 2020-08-26 DIAGNOSIS — M109 Gout, unspecified: Secondary | ICD-10-CM | POA: Diagnosis not present

## 2020-08-26 DIAGNOSIS — Z9181 History of falling: Secondary | ICD-10-CM | POA: Diagnosis not present

## 2020-08-26 DIAGNOSIS — E1122 Type 2 diabetes mellitus with diabetic chronic kidney disease: Secondary | ICD-10-CM | POA: Diagnosis not present

## 2020-08-26 DIAGNOSIS — I2510 Atherosclerotic heart disease of native coronary artery without angina pectoris: Secondary | ICD-10-CM | POA: Diagnosis not present

## 2020-08-26 DIAGNOSIS — M810 Age-related osteoporosis without current pathological fracture: Secondary | ICD-10-CM | POA: Diagnosis not present

## 2020-08-26 DIAGNOSIS — L98491 Non-pressure chronic ulcer of skin of other sites limited to breakdown of skin: Secondary | ICD-10-CM | POA: Diagnosis not present

## 2020-08-26 DIAGNOSIS — N183 Chronic kidney disease, stage 3 unspecified: Secondary | ICD-10-CM | POA: Diagnosis not present

## 2020-08-26 DIAGNOSIS — I270 Primary pulmonary hypertension: Secondary | ICD-10-CM | POA: Diagnosis not present

## 2020-08-26 DIAGNOSIS — Z96652 Presence of left artificial knee joint: Secondary | ICD-10-CM | POA: Diagnosis not present

## 2020-08-26 DIAGNOSIS — L89891 Pressure ulcer of other site, stage 1: Secondary | ICD-10-CM | POA: Diagnosis not present

## 2020-08-26 DIAGNOSIS — M159 Polyosteoarthritis, unspecified: Secondary | ICD-10-CM | POA: Diagnosis not present

## 2020-08-26 DIAGNOSIS — W19XXXS Unspecified fall, sequela: Secondary | ICD-10-CM | POA: Diagnosis not present

## 2020-08-26 DIAGNOSIS — Z7982 Long term (current) use of aspirin: Secondary | ICD-10-CM | POA: Diagnosis not present

## 2020-08-26 DIAGNOSIS — E538 Deficiency of other specified B group vitamins: Secondary | ICD-10-CM | POA: Diagnosis not present

## 2020-08-26 NOTE — Progress Notes (Signed)
Subjective:  Patient ID: [name], female    DOB: Feb 16, 1923,  MRN: [id number]  86 y.o. female is seen for follow up ulcer submet head 4 right foot.  Daughter is present during today's visit and accompanies her on every visit.  Her bunion area continues to feel good.  [name] states the right foot is a little sore today.   She wears off-loaded Darco shoe at home mostly and had new Peg Assist system applied on last visit on 06/03/2020.  Wound location: plantar aspect of forefoot right foot. They deny any redness, drainage or swelling around wound.  New problem: Daughter relates [name] is complaining of tenderness along lateral aspect of foot. Denies any new episodes of trauma. Area along right lateral forefoot is red and tender. Denies any drainage, wounds or blisters.  Patient denies fever, chills, night sweats, nausea, or vomiting.  Past Medical History:  Diagnosis Date  . Anemia   . Arthritis   . Glaucoma    OU  . Gout, joint   . Hypertension   . Macular degeneration    OU  . NSTEMI (non-ST elevated myocardial infarction) ([place]) 08/2015  . Stroke [facility])      Past Surgical History:  Procedure Laterality Date  . ABDOMINAL HYSTERECTOMY    . BREAST SURGERY     CYST  . CARDIAC CATHETERIZATION N/A 08/18/2015   Procedure: Left Heart Cath and Coronary Angiography;  Surgeon: Troy Sine, MD;  Location: [place] [facility];  Service: Cardiovascular;  Laterality: N/A;  . CARDIAC CATHETERIZATION  08/18/2015   Procedure: Coronary Stent Intervention;  Surgeon: Troy Sine, MD;  Location: [place] [facility];  Service: Cardiovascular;;  . CATARACT EXTRACTION Bilateral   . DILATION AND CURETTAGE OF UTERUS    . EYE SURGERY Bilateral   . HERNIA REPAIR    . IRIDOTOMY / IRIDECTOMY Right   . JOINT REPLACEMENT  left knee  . REFRACTIVE SURGERY Right 2017     Current Outpatient Medications on File Prior to Visit  Medication Sig Dispense Refill  . Acetaminophen (TYLENOL) 325 MG  CAPS Take 325 mg by mouth 3 (three) times daily as needed.    Marland Kitchen aspirin 81 MG chewable tablet Chew 81 mg by mouth daily.    Marland Kitchen atorvastatin (LIPITOR) 80 MG tablet Take 1 tablet (80 mg total) by mouth daily at 6 PM. 60 tablet 0  . atropine 1 % ophthalmic solution Place one drop into the right eye 2 (two) times daily for 14 days. Do not take tonight.  [name] will evaluate your post-operative status tomorrow and may make changes to the duration and dose of this medication.    . bimatoprost (LUMIGAN) 0.01 % SOLN Place 1 drop into the right eye at bedtime.     . brimonidine (ALPHAGAN) 0.2 % ophthalmic solution brimonidine 0.2 % eye drops  INSTILL 1 DROP INTO BOTH EYES TWICE A DAY    . Calcium Carbonate-Vitamin D (CALTRATE 600+D PO) Take 1 tablet by mouth 2 (two) times daily.    . chlorpheniramine (CHLOR-TRIMETON) 4 MG tablet Take 4 mg by mouth every 4 (four) hours as needed for allergies.    Marland Kitchen diclofenac sodium (VOLTAREN) 1 % GEL diclofenac 1 % topical gel  AS DIRECTED TWICE A DAY AS NEEDED TRANSDERMAL 14 DAYS    . dorzolamide-timolol (COSOPT) 22.3-6.8 MG/ML ophthalmic solution Place 1 drop into both eyes 2 (two) times daily.     . famotidine (PEPCID) 20 MG tablet Take  20 mg by mouth daily.    Marland Kitchen FLUZONE HIGH-DOSE QUADRIVALENT 0.7 ML SUSY     . furosemide (LASIX) 20 MG tablet Take 20 mg by mouth as needed.     Marland Kitchen ketorolac (ACULAR) 0.5 % ophthalmic solution Place one drop into the right eye 4 (four) times daily for 28 days.    Marland Kitchen latanoprost (XALATAN) 0.005 % ophthalmic solution latanoprost 0.005 % eye drops  INSTILL 1 DROP INTO RIGHT EYE AT BEDTIME    . losartan (COZAAR) 25 MG tablet Take 50 mg by mouth daily.    Marland Kitchen losartan (COZAAR) 50 MG tablet     . metoprolol (LOPRESSOR) 50 MG tablet Take 25 mg by mouth 2 (two) times daily.    Marland Kitchen moxifloxacin (VIGAMOX) 0.5 % ophthalmic solution Apply 1 drop to eye 3 (three) times daily.    . Multiple Vitamins-Minerals (MULTIPLE VITAMINS/WOMENS PO) Take 1 tablet by  mouth daily. W/vitamin D    . MYRBETRIQ 25 MG TB24 tablet Take 25 mg by mouth daily.    Marland Kitchen neomycin-polymyxin-dexameth (MAXITROL) 0.1 % OINT Do not use tonight!   Apply a strip of ointment the size of a grain of rice to RIGHT eye at bedtime. This must be the last eye medicine of the day.   [name] will evaluate your post-operative status tomorrow and may make changes to the duration and dose of this medication.    Mckinley Jewel Dimesylate (RHOPRESSA) 0.02 % SOLN Rhopressa 0.02 % eye drops  INSTILL 1 DROP INTO AFFECTED EYE(S) BY OPHTHALMIC ROUTE ONCE DAILY INTHE EVENING    . pantoprazole (PROTONIX) 40 MG tablet Take 1 tablet (40 mg total) by mouth daily. Take 30-60 min before first meal of the day 30 tablet 2  . Polyethyl Glycol-Propyl Glycol (SYSTANE OP) Apply 1 drop to eye 2 (two) times daily.    . prednisoLONE acetate (PRED FORTE) 1 % ophthalmic suspension 1 gtt  4 times a day for one week, then 3 times a day for one week, then 2 times a day for one week, then 1 time a day for one week, then stop right eye.    . prednisoLONE acetate (PRED FORTE) 1 % ophthalmic suspension     . Zoster Vaccine Adjuvanted [facility]) injection Shingrix (PF) 50 mcg/0.5 mL intramuscular suspension, kit     No current facility-administered medications on file prior to visit.     Allergies  Allergen Reactions  . Meloxicam Hives  . Other   . Nitrofuran Derivatives Rash     Family History  Problem Relation Age of Onset  . Heart disease Mother   . Stroke Mother      Social History   Occupational History    Comment: retired  Tobacco Use  . Smoking status: Never Smoker  . Smokeless tobacco: Never Used  Substance and Sexual Activity  . Alcohol use: No    Alcohol/week: 0.0 standard drinks  . Drug use: No  . Sexual activity: Never    Birth control/protection: Post-menopausal     Immunization History  Administered Date(s) Administered  . Influenza Whole 06/10/2017  . Influenza-Unspecified 07/26/2017   . PFIZER SARS-COV-2 Vaccination 11/16/2019, 12/11/2019  . Pneumococcal Polysaccharide-23 09/28/2012   Objective:  Physical Exam: There were no vitals filed for this visit.   86 y.o. female pleasant Caucasian female in NAD. AAO x 3.  Neurovascular status unchanged b/l. Capillary fill time to digits <3 seconds b/l. Palpable DP pulses b/l. Palpable PT pulses b/l. Pedal hair absent b/l Skin  temperature gradient within normal limits b/l. Nonpitting edema noted b/l LE.  Pedal skin with normal turgor, texture and tone bilaterally. No interdigital macerations bilaterally. Toenails 1-5 b/l are painful, elongated, discolored, dystrophic with subungual debris. Pain with dorsal palpation of nailplates. No erythema, no edema, no drainage noted.  Right 5th metatarsal head with tenderness to palpation. Mild warmth. No fluctuance. No wound.  Wound Location: ball of right foot There is a moderate amount of devitalized hyperkeratotic tissue present.   Predebridement Wound Measurement: 0.3 x 0.2 cm with hyperkeratotic roof and subdermal hemorrhage Postdebridement Wound Measurement: 0.3 x 0.2 x 0.1 cm and is partial thickness submet head 3 right foot.   Wound Base:  Epithelial Peri-wound: Calloused Exudate: None: wound tissue dry Blood Loss during debridement: 0 cc's. Description of tissue removed from ulceration today:  Hyperkeratosis and devitalized subcutaneous tissue. Signs of clinical bacterial infection are absent. Material in wound which inhibits healing/promotes adjacent tissue breakdown: Hyperkeratosis  Normal muscle strength 5/5 to all lower extremity muscle groups bilaterally. No pain crepitus or joint limitation noted with ROM b/l. Plantarflexed metatarsal(s) right lower extremity. Hallux valgus with bunion deformity noted b/l lower extremities. Hammertoes noted to the 2-5 bilaterally. Noted plantar fat pad atrophy of forefoot area b/l lower extremities.  Protective sensation diminished with  10g monofilament b/l. Proprioception intact bilaterally.  Assessment:   1. Cellulitis and abscess of foot, except toes   2. Neurogenic ulcer, limited to breakdown of skin ([place])   3. Pain due to onychomycosis of toenails of both feet    Plan:  -Patient was evaluated and treated and all questions answered.  -Toenails 1-5 b/l debrided in length and girth without iatrogenic laceration. -Porokeratotic lesion right 1st metatarsal head: continue daily use of tubefoam bunion shield. -Patient/POA/Family member educated on diagnosis and treatment plan of routine ulcer debridement/wound care.  -Type/amount of devitalized tissue removed: devitalized hyperkeratotic tissue. -Today's postdebridement measurements: 0.3 x 0.2 x 0.1 cm submet head 3 right foot ulcer size post-debridement: Ulcer cleansed with wound cleanser. Triple antibiotic ointment and band-aid applied.. -Wound responded well to today's debridement. -For cellulitis right foot, Rx sent for Cephalexin 500 mg po bid x one week. -Patient risk factors affecting healing of ulcer: foot deformity, neuropathy CKD, fat pad atrophy b/l feet, recurrent ulceration. -Daughter is well experienced/educated onf the chronic ulceration. She was instructed to apply Iodosorb Gel to wounds once daily if they start to drain. If it opens and does not drain, she may apply the Medihoney. -Frequency of debridement needed to achieve healing: every 2-3 weeks to prevent breakdown. I will be out of the office and she will follow up with available physician and resume care with me in 6 weeks. -Call office if condition worsens or Ms. Mcferran experiences any increased redness, drainage, swelling, warmth of right foot, fever, chills, night sweats, nausea, vomiting  Return in about 3 weeks (around 09/16/2020) for follow up with available Physician for partial thickness ulcer right foot.  Marzetta Board, DPM

## 2020-08-27 ENCOUNTER — Encounter: Payer: Self-pay | Admitting: Podiatry

## 2020-08-27 DIAGNOSIS — R8271 Bacteriuria: Secondary | ICD-10-CM | POA: Diagnosis not present

## 2020-08-27 MED ORDER — CEPHALEXIN 500 MG PO CAPS: 500.0000 mg | ORAL_CAPSULE | Freq: Two times a day (BID) | ORAL | 0 refills | Status: DC

## 2020-08-28 ENCOUNTER — Encounter: Payer: Self-pay | Admitting: Podiatry

## 2020-08-28 DIAGNOSIS — M1711 Unilateral primary osteoarthritis, right knee: Secondary | ICD-10-CM | POA: Diagnosis not present

## 2020-09-05 NOTE — Progress Notes (Signed)
[facility] Note  09/09/2020     CHIEF COMPLAINT Patient presents for Retina Follow Up   HISTORY OF PRESENT ILLNESS: [name] is a 86 y.o. female who presents to the clinic today for:  HPI    Retina Follow Up    Patient presents with  Other.  In right eye.  This started 4 weeks ago.  I, the attending physician,  performed the HPI with the patient and updated documentation appropriately.          Comments    Patient here for 4 weeks retina follow up for HRVO OD. Patient states vision doing ok. No eye pain.        Last edited by Bernarda Caffey, MD on 09/09/2020 12:44 PM. (History)    pt states no problems with her right eye after last injections  Referring physician: Jani Gravel, [place] [address],  [place] [id number]  HISTORICAL INFORMATION:   Selected notes from the MEDICAL RECORD NUMBER Referred by Dr. Midge Aver for concern of RVO OS   CURRENT MEDICATIONS: Current Outpatient Medications (Ophthalmic Drugs)  Medication Sig  . atropine 1 % ophthalmic solution Place one drop into the right eye 2 (two) times daily for 14 days. Do not take tonight.  [name] will evaluate your post-operative status tomorrow and may make changes to the duration and dose of this medication.  . bimatoprost (LUMIGAN) 0.01 % SOLN Place 1 drop into the right eye at bedtime.   . brimonidine (ALPHAGAN) 0.2 % ophthalmic solution brimonidine 0.2 % eye drops  INSTILL 1 DROP INTO BOTH EYES TWICE A DAY  . dorzolamide-timolol (COSOPT) 22.3-6.8 MG/ML ophthalmic solution Place 1 drop into both eyes 2 (two) times daily.   Marland Kitchen ketorolac (ACULAR) 0.5 % ophthalmic solution Place one drop into the right eye 4 (four) times daily for 28 days.  Marland Kitchen latanoprost (XALATAN) 0.005 % ophthalmic solution latanoprost 0.005 % eye drops  INSTILL 1 DROP INTO RIGHT EYE AT BEDTIME  . moxifloxacin (VIGAMOX) 0.5 % ophthalmic solution Apply 1 drop to eye 3 (three) times daily.  Marland Kitchen  neomycin-polymyxin-dexameth (MAXITROL) 0.1 % OINT Do not use tonight!   Apply a strip of ointment the size of a grain of rice to RIGHT eye at bedtime. This must be the last eye medicine of the day.   [name] will evaluate your post-operative status tomorrow and may make changes to the duration and dose of this medication.  Mckinley Jewel Dimesylate (RHOPRESSA) 0.02 % SOLN Rhopressa 0.02 % eye drops  INSTILL 1 DROP INTO AFFECTED EYE(S) BY OPHTHALMIC ROUTE ONCE DAILY INTHE EVENING  . Polyethyl Glycol-Propyl Glycol (SYSTANE OP) Apply 1 drop to eye 2 (two) times daily.  . prednisoLONE acetate (PRED FORTE) 1 % ophthalmic suspension 1 gtt  4 times a day for one week, then 3 times a day for one week, then 2 times a day for one week, then 1 time a day for one week, then stop right eye.  . prednisoLONE acetate (PRED FORTE) 1 % ophthalmic suspension    No current facility-administered medications for this visit. (Ophthalmic Drugs)   Current Outpatient Medications (Other)  Medication Sig  . Acetaminophen (TYLENOL) 325 MG CAPS Take 325 mg by mouth 3 (three) times daily as needed.  Marland Kitchen aspirin 81 MG chewable tablet Chew 81 mg by mouth daily.  Marland Kitchen atorvastatin (LIPITOR) 80 MG tablet Take 1 tablet (80 mg total) by mouth daily at 6 PM.  .  Calcium Carbonate-Vitamin D (CALTRATE 600+D PO) Take 1 tablet by mouth 2 (two) times daily.  . cephALEXin (KEFLEX) 500 MG capsule Take 1 capsule (500 mg total) by mouth 2 (two) times daily.  . chlorpheniramine (CHLOR-TRIMETON) 4 MG tablet Take 4 mg by mouth every 4 (four) hours as needed for allergies.  Marland Kitchen diclofenac sodium (VOLTAREN) 1 % GEL diclofenac 1 % topical gel  AS DIRECTED TWICE A DAY AS NEEDED TRANSDERMAL 14 DAYS  . famotidine (PEPCID) 20 MG tablet Take 20 mg by mouth daily.  Marland Kitchen FLUZONE HIGH-DOSE QUADRIVALENT 0.7 ML SUSY   . furosemide (LASIX) 20 MG tablet Take 20 mg by mouth as needed.   Marland Kitchen losartan (COZAAR) 25 MG tablet Take 50 mg by mouth daily.  Marland Kitchen losartan  (COZAAR) 50 MG tablet   . metoprolol (LOPRESSOR) 50 MG tablet Take 25 mg by mouth 2 (two) times daily.  . Multiple Vitamins-Minerals (MULTIPLE VITAMINS/WOMENS PO) Take 1 tablet by mouth daily. W/vitamin D  . MYRBETRIQ 25 MG TB24 tablet Take 25 mg by mouth daily.  . pantoprazole (PROTONIX) 40 MG tablet Take 1 tablet (40 mg total) by mouth daily. Take 30-60 min before first meal of the day  . Zoster Vaccine Adjuvanted [facility]) injection Shingrix (PF) 50 mcg/0.5 mL intramuscular suspension, kit   No current facility-administered medications for this visit. (Other)      REVIEW OF SYSTEMS: ROS    Positive for: Neurological, Genitourinary, Musculoskeletal, Cardiovascular, Eyes   Negative for: Constitutional, Gastrointestinal, Skin, HENT, Endocrine, Respiratory, Psychiatric, Allergic/Imm, Heme/Lymph   Last edited by Theodore Demark, COA on 09/09/2020  9:57 AM. (History)       ALLERGIES Allergies  Allergen Reactions  . Meloxicam Hives  . Other   . Nitrofuran Derivatives Rash    PAST MEDICAL HISTORY Past Medical History:  Diagnosis Date  . Anemia   . Arthritis   . Glaucoma    OU  . Gout, joint   . Hypertension   . Macular degeneration    OU  . NSTEMI (non-ST elevated myocardial infarction) ([place]) 08/2015  . Stroke [facility])    Past Surgical History:  Procedure Laterality Date  . ABDOMINAL HYSTERECTOMY    . BREAST SURGERY     CYST  . CARDIAC CATHETERIZATION N/A 08/18/2015   Procedure: Left Heart Cath and Coronary Angiography;  Surgeon: Troy Sine, MD;  Location: [place] [facility];  Service: Cardiovascular;  Laterality: N/A;  . CARDIAC CATHETERIZATION  08/18/2015   Procedure: Coronary Stent Intervention;  Surgeon: Troy Sine, MD;  Location: [place] [facility];  Service: Cardiovascular;;  . CATARACT EXTRACTION Bilateral   . DILATION AND CURETTAGE OF UTERUS    . EYE SURGERY Bilateral   . HERNIA REPAIR    . IRIDOTOMY / IRIDECTOMY Right   . JOINT REPLACEMENT  left knee   . REFRACTIVE SURGERY Right 2017    FAMILY HISTORY Family History  Problem Relation Age of Onset  . Heart disease Mother   . Stroke Mother     SOCIAL HISTORY Social History   Tobacco Use  . Smoking status: Never Smoker  . Smokeless tobacco: Never Used  Substance Use Topics  . Alcohol use: No    Alcohol/week: 0.0 standard drinks  . Drug use: No         OPHTHALMIC EXAM:  Base Eye Exam    Visual Acuity (Snellen - Linear)      Right Left   Dist cc CF at 6 " 20/30 +1  Dist ph cc NI 20/25 -2       Tonometry (Tonopen, 9:52 AM)      Right Left   Pressure 7 8       Pupils      Dark Light Shape React APD   Right 3 2 Round Brisk None   Left 3 2 Round Brisk None       Visual Fields      Left Right    Full    Restrictions  Total superior temporal, inferior temporal, superior nasal deficiencies       Extraocular Movement      Right Left    Full Full       Neuro/Psych    Oriented x3: Yes   Mood/Affect: Normal       Dilation    Both eyes: 1.0% Mydriacyl, 2.5% Phenylephrine @ 9:52 AM        Slit Lamp and Fundus Exam    Slit Lamp Exam      Right Left   Lids/Lashes Dermatochalasis - upper lid, Telangiectasia, Meibomian gland dysfunction Dermatochalasis - upper lid, mild Meibomian gland dysfunction   Conjunctiva/Sclera Trace Injection Trace Injection   Cornea Arcus, 3+ Punctate epithelial erosions, Well healed cataract wounds, mild Debris in tear film Arcus, 2-3+ inferior Punctate epithelial erosions, Well healed cataract wounds   Anterior Chamber Deep and quiet Deep and quiet   Iris Round and poorly dilated to 26m, No NVI Round and moderately dilated to 526m  Lens PC IOL in good position with open PC PC IOL in good position with open PC   Vitreous Vitreous syneresis, Posterior vitreous detachment, vitreous condensations Vitreous syneresis, Posterior vitreous detachment       Fundus Exam      Right Left   Disc Tilted disc, thin inferior rim, +cupping,  3+pallor, 360 PPA, inferior vascular loops 1+ Pallor, Sharp rim, +cupping, temporal Peripapillary atrophy   C/D Ratio 0.9 0.6   Macula Blunted foveal reflex, inferior edema and IRH, central pigment clumping--slightly improved from prior Flat, Blunted foveal reflex, RPE mottling and clumping, No heme or edema   Vessels Vascular attenuation, Tortuous Vascular attenuation, mild Tortuousity   Periphery Attached, inferior IRH/DBH  Attached, No heme         Refraction    Wearing Rx      Sphere Cylinder Axis   Right -1.50 +2.25 173   Left -0.75 +2.25 166          IMAGING AND PROCEDURES  Imaging and Procedures for '@TODAY' @  OCT, Retina - OU - Both Eyes       Right Eye Quality was poor. Central Foveal Thickness: 510. Progression has improved. Findings include subretinal hyper-reflective material, intraretinal fluid, intraretinal hyper-reflective material, epiretinal membrane, no SRF, outer retinal atrophy, abnormal foveal contour (Poor image quality -- mild Interval improvement in IRF - still massive).   Left Eye Quality was borderline. Central Foveal Thickness: 248. Progression has been stable. Findings include normal foveal contour, no IRF, no SRF, retinal drusen .   Notes *Images captured and stored on drive  Diagnosis / Impression:  OD: Poor image quality -- mild Interval improvement in IRF - still massive OS: NFP, no IRF/SRF, +drusen  Clinical management:  See below  Abbreviations: NFP - Normal foveal profile. CME - cystoid macular edema. PED - pigment epithelial detachment. IRF - intraretinal fluid. SRF - subretinal fluid. EZ - ellipsoid zone. ERM - epiretinal membrane. ORA - outer retinal atrophy. ORT - outer retinal tubulation. SRHM -  subretinal hyper-reflective material        Intravitreal Injection, Pharmacologic Agent - OD - Right Eye       Time Out 09/09/2020. 10:43 AM. Confirmed correct patient, procedure, site, and patient consented.   Anesthesia Topical  anesthesia was used. Anesthetic medications included Lidocaine 2%, Proparacaine 0.5%.   Procedure Preparation included eyelid speculum, 5% betadine to ocular surface. A supplied needle was used.   Injection:  1.25 mg Bevacizumab (AVASTIN) SOLN   NDC: 83151-761-60, Lot: [id number]'@[id number]' , Expiration date: 10/06/2020   Route: Intravitreal, Site: Right Eye, Waste: 0 mL  Post-op Post injection exam found visual acuity of at least counting fingers. The patient tolerated the procedure well. There were no complications. The patient received written and verbal post procedure care education.                 ASSESSMENT/PLAN:    ICD-10-CM   1. Branch retinal vein occlusion of right eye with macular edema  H34.8310 Intravitreal Injection, Pharmacologic Agent - OD - Right Eye    Bevacizumab (AVASTIN) SOLN 1.25 mg  2. Retinal edema  H35.81 OCT, Retina - OU - Both Eyes  3. Exudative age-related macular degeneration of right eye with active choroidal neovascularization ([place])  H35.3211   4. Intermediate stage nonexudative age-related macular degeneration of left eye  H35.3122   5. Essential hypertension  I10   6. Hypertensive retinopathy of both eyes  H35.033   7. Primary open angle glaucoma (POAG) of both eyes, severe stage  H40.1133   8. Pseudophakia of both eyes  Z96.1     1,2. Inferior HRVO w/ CME, OD  - pt lost to f/u from 10/16/2019 to 07/14/2020 (9 mos)  - presenting BCVA CF 2' (09.14.20)  - pt subjectively reports decline in vision OD since Feb 2020  - presenting OCT shows severe CME/IRF temporal macula, +SRF overlying low PED (?exudative ARMD component)  - S/p IVA OD #1 09.14.20, #2 (10.20.20), #3 (12.02.20), #4 (01.06.21), #5 (10.05.21), #6 (11.03.21)  - OCT -- Poor image quality -- Interval improvement in CME inferior macula  - discussed findings, poor prognosis and treatment options             - Recommend IVA OD #7 today, 12.01.21 for CME and prevention of further damage; discussed  limited benefit to visual acuity OD  - pt wishes to proceed  - RBA of procedure discussed, questions answered  - informed consent obtained and signed  - see procedure note  - f/u 4 weeks, DFE, OCT, possible injection  3. Exudative age related macular degeneration, OD    - initial OCT showed +IRF/SRF -- SRF overlying low lying PED  - exam shows +CNVM with surrounding heme -- improved today  - suspect exudative ARMD component complicating HRVO w/ CME  - S/p IVA OD #1 (09.14.20), #2 (10.20.20), #3 (12.02.20), #4 (01.06.21), #5 (10.05.21), #6 (11.03.21)  - recommend IVA OD #7 as above  - f/u in 4 weeks  4. Age related macular degeneration, non-exudative, OS  - The incidence, anatomy, and pathology of dry AMD, risk of progression, and the AREDS and AREDS 2 study including smoking risks discussed with patient.  - recommend Amsler grid monitoring  5,6. Hypertensive retinopathy OU  - discussed importance of tight BP control  - monitor  7. POAG OU -- severe stage  - under the expert management of Dr. 24.03.21  - s/p SLT OD 3.23.2017  - s/p TDC OD w/ Dr. 4.5.2017, 3.30.21  - IOP good  today at 7,8  - pt on latanoprost qhs OD, dorzolamide/timolol bid OU, brimonidine bid OU  8. Pseudophakia OU  - s/p CE/IOL OU  - beautiful surgeries, doing well  - monitor   Ophthalmic Meds Ordered this visit:  Meds ordered this encounter  Medications  . Bevacizumab (AVASTIN) SOLN 1.25 mg       Return in about 4 weeks (around 10/07/2020) for f/u HRVO OD, DFE, OCT.  There are no Patient Instructions on file for this visit.   Explained the diagnoses, plan, and follow up with the patient and they expressed understanding.  Patient expressed understanding of the importance of proper follow up care.   This document serves as a record of services personally performed by Gardiner Sleeper, MD, PhD. It was created on their behalf by Roselee Nova, COMT. The creation of this record is the provider's dictation  and/or activities during the visit.  Electronically signed by: Roselee Nova, COMT 09/09/20 12:46 PM   This document serves as a record of services personally performed by Gardiner Sleeper, MD, PhD. It was created on their behalf by San Jetty. Owens Shark, OA an ophthalmic technician. The creation of this record is the provider's dictation and/or activities during the visit.    Electronically signed by: San Jetty. Owens Shark, [place] 12.01.2021 12:46 PM  Diseases & Surgery of the Retina and Vitreous [facility]  I have reviewed the above documentation for accuracy and completeness, and I agree with the above. Gardiner Sleeper, M.D., Ph.D. 09/09/20 12:46 PM  Abbreviations: M myopia (nearsighted); A astigmatism; H hyperopia (farsighted); P presbyopia; Mrx spectacle prescription;  CTL contact lenses; OD right eye; OS left eye; OU both eyes  XT exotropia; ET esotropia; PEK punctate epithelial keratitis; PEE punctate epithelial erosions; DES dry eye syndrome; MGD meibomian gland dysfunction; ATs artificial tears; PFAT's preservative free artificial tears; Schubert nuclear sclerotic cataract; PSC posterior subcapsular cataract; ERM epi-retinal membrane; PVD posterior vitreous detachment; RD retinal detachment; DM diabetes mellitus; DR diabetic retinopathy; NPDR non-proliferative diabetic retinopathy; PDR proliferative diabetic retinopathy; CSME clinically significant macular edema; DME diabetic macular edema; dbh dot blot hemorrhages; CWS cotton wool spot; POAG primary open angle glaucoma; C/D cup-to-disc ratio; HVF humphrey visual field; GVF goldmann visual field; OCT optical coherence tomography; IOP intraocular pressure; BRVO Branch retinal vein occlusion; CRVO central retinal vein occlusion; CRAO central retinal artery occlusion; BRAO branch retinal artery occlusion; RT retinal tear; SB scleral buckle; PPV pars plana vitrectomy; VH Vitreous hemorrhage; PRP panretinal laser photocoagulation; IVK intravitreal  kenalog; VMT vitreomacular traction; MH Macular hole;  NVD neovascularization of the disc; NVE neovascularization elsewhere; AREDS age related eye disease study; ARMD age related macular degeneration; POAG primary open angle glaucoma; EBMD epithelial/anterior basement membrane dystrophy; ACIOL anterior chamber intraocular lens; IOL intraocular lens; PCIOL posterior chamber intraocular lens; Phaco/IOL phacoemulsification with intraocular lens placement; Blaine photorefractive keratectomy; LASIK laser assisted in situ keratomileusis; HTN hypertension; DM diabetes mellitus; COPD chronic obstructive pulmonary disease

## 2020-09-09 ENCOUNTER — Ambulatory Visit (INDEPENDENT_AMBULATORY_CARE_PROVIDER_SITE_OTHER): Payer: Medicare Other | Admitting: Ophthalmology

## 2020-09-09 ENCOUNTER — Encounter (INDEPENDENT_AMBULATORY_CARE_PROVIDER_SITE_OTHER): Payer: Self-pay | Admitting: Ophthalmology

## 2020-09-09 ENCOUNTER — Other Ambulatory Visit: Payer: Self-pay

## 2020-09-09 DIAGNOSIS — H3581 Retinal edema: Secondary | ICD-10-CM | POA: Diagnosis not present

## 2020-09-09 DIAGNOSIS — H353211 Exudative age-related macular degeneration, right eye, with active choroidal neovascularization: Secondary | ICD-10-CM | POA: Diagnosis not present

## 2020-09-09 DIAGNOSIS — I10 Essential (primary) hypertension: Secondary | ICD-10-CM

## 2020-09-09 DIAGNOSIS — H401133 Primary open-angle glaucoma, bilateral, severe stage: Secondary | ICD-10-CM

## 2020-09-09 DIAGNOSIS — H35033 Hypertensive retinopathy, bilateral: Secondary | ICD-10-CM

## 2020-09-09 DIAGNOSIS — H353122 Nonexudative age-related macular degeneration, left eye, intermediate dry stage: Secondary | ICD-10-CM

## 2020-09-09 DIAGNOSIS — H34831 Tributary (branch) retinal vein occlusion, right eye, with macular edema: Secondary | ICD-10-CM

## 2020-09-09 DIAGNOSIS — Z961 Presence of intraocular lens: Secondary | ICD-10-CM

## 2020-09-09 MED ORDER — BEVACIZUMAB CHEMO INJECTION 1.25MG/0.05ML SYRINGE FOR KALEIDOSCOPE
1.2500 mg | INTRAVITREAL | Status: AC | PRN
  Administered 2020-09-09: 1.25 mg via INTRAVITREAL

## 2020-09-17 ENCOUNTER — Ambulatory Visit: Payer: Medicare Other | Admitting: Podiatry

## 2020-09-17 ENCOUNTER — Other Ambulatory Visit: Payer: Self-pay

## 2020-09-17 DIAGNOSIS — L97511 Non-pressure chronic ulcer of other part of right foot limited to breakdown of skin: Secondary | ICD-10-CM

## 2020-09-18 NOTE — Progress Notes (Signed)
  Subjective:  Patient ID: [name], female    DOB: [birth year],  MRN: [id number]  Chief Complaint  Patient presents with  . Ulcer    Pt daughter states she that her mother's ulcer looks better.     86 y.o. female presents with the above complaint. History confirmed with patient.  She is referred to me by Dr. Eloy End who informed me to evaluate her for the duration of present consult for the right foot.  Is not painful.  Her daughter is here with her son and thinks it is healed.  Objective:  Physical Exam: warm, good capillary refill, no trophic changes or ulcerative lesions, normal DP and PT pulses and normal sensory exam.  Right Foot: Segment 4 ulceration is fully healed with mild hyperkeratosis  Assessment:  No diagnosis found.   Plan:  Patient was evaluated and treated and all questions answered.  Debrided the overlying hyperkeratosis in the wound has completely healed and remains healed.  Recommend continued surveillance and offloading.  She can return to regular shoe gear and apply lotion.  Return as needed if the ulcer recurs  No follow-ups on file.

## 2020-10-07 ENCOUNTER — Encounter: Payer: Self-pay | Admitting: Podiatry

## 2020-10-07 ENCOUNTER — Ambulatory Visit (INDEPENDENT_AMBULATORY_CARE_PROVIDER_SITE_OTHER): Payer: Medicare Other | Admitting: Ophthalmology

## 2020-10-07 ENCOUNTER — Other Ambulatory Visit: Payer: Self-pay

## 2020-10-07 ENCOUNTER — Encounter (INDEPENDENT_AMBULATORY_CARE_PROVIDER_SITE_OTHER): Payer: Self-pay | Admitting: Ophthalmology

## 2020-10-07 ENCOUNTER — Ambulatory Visit: Payer: Medicare Other | Admitting: Podiatry

## 2020-10-07 DIAGNOSIS — M109 Gout, unspecified: Secondary | ICD-10-CM | POA: Insufficient documentation

## 2020-10-07 DIAGNOSIS — I5032 Chronic diastolic (congestive) heart failure: Secondary | ICD-10-CM | POA: Insufficient documentation

## 2020-10-07 DIAGNOSIS — Z961 Presence of intraocular lens: Secondary | ICD-10-CM

## 2020-10-07 DIAGNOSIS — I10 Essential (primary) hypertension: Secondary | ICD-10-CM

## 2020-10-07 DIAGNOSIS — I69959 Hemiplegia and hemiparesis following unspecified cerebrovascular disease affecting unspecified side: Secondary | ICD-10-CM | POA: Insufficient documentation

## 2020-10-07 DIAGNOSIS — I270 Primary pulmonary hypertension: Secondary | ICD-10-CM | POA: Insufficient documentation

## 2020-10-07 DIAGNOSIS — E559 Vitamin D deficiency, unspecified: Secondary | ICD-10-CM | POA: Insufficient documentation

## 2020-10-07 DIAGNOSIS — H3581 Retinal edema: Secondary | ICD-10-CM | POA: Diagnosis not present

## 2020-10-07 DIAGNOSIS — Z0000 Encounter for general adult medical examination without abnormal findings: Secondary | ICD-10-CM | POA: Insufficient documentation

## 2020-10-07 DIAGNOSIS — M810 Age-related osteoporosis without current pathological fracture: Secondary | ICD-10-CM | POA: Insufficient documentation

## 2020-10-07 DIAGNOSIS — H353122 Nonexudative age-related macular degeneration, left eye, intermediate dry stage: Secondary | ICD-10-CM | POA: Diagnosis not present

## 2020-10-07 DIAGNOSIS — R296 Repeated falls: Secondary | ICD-10-CM | POA: Insufficient documentation

## 2020-10-07 DIAGNOSIS — I079 Rheumatic tricuspid valve disease, unspecified: Secondary | ICD-10-CM | POA: Insufficient documentation

## 2020-10-07 DIAGNOSIS — I69393 Ataxia following cerebral infarction: Secondary | ICD-10-CM | POA: Insufficient documentation

## 2020-10-07 DIAGNOSIS — M159 Polyosteoarthritis, unspecified: Secondary | ICD-10-CM | POA: Insufficient documentation

## 2020-10-07 DIAGNOSIS — G629 Polyneuropathy, unspecified: Secondary | ICD-10-CM

## 2020-10-07 DIAGNOSIS — H401133 Primary open-angle glaucoma, bilateral, severe stage: Secondary | ICD-10-CM

## 2020-10-07 DIAGNOSIS — M25569 Pain in unspecified knee: Secondary | ICD-10-CM | POA: Insufficient documentation

## 2020-10-07 DIAGNOSIS — H353211 Exudative age-related macular degeneration, right eye, with active choroidal neovascularization: Secondary | ICD-10-CM | POA: Diagnosis not present

## 2020-10-07 DIAGNOSIS — R059 Cough, unspecified: Secondary | ICD-10-CM | POA: Insufficient documentation

## 2020-10-07 DIAGNOSIS — E538 Deficiency of other specified B group vitamins: Secondary | ICD-10-CM | POA: Insufficient documentation

## 2020-10-07 DIAGNOSIS — H35033 Hypertensive retinopathy, bilateral: Secondary | ICD-10-CM

## 2020-10-07 DIAGNOSIS — D509 Iron deficiency anemia, unspecified: Secondary | ICD-10-CM | POA: Insufficient documentation

## 2020-10-07 DIAGNOSIS — H34831 Tributary (branch) retinal vein occlusion, right eye, with macular edema: Secondary | ICD-10-CM | POA: Diagnosis not present

## 2020-10-07 DIAGNOSIS — L98491 Non-pressure chronic ulcer of skin of other sites limited to breakdown of skin: Secondary | ICD-10-CM

## 2020-10-07 DIAGNOSIS — E1121 Type 2 diabetes mellitus with diabetic nephropathy: Secondary | ICD-10-CM | POA: Insufficient documentation

## 2020-10-07 DIAGNOSIS — K219 Gastro-esophageal reflux disease without esophagitis: Secondary | ICD-10-CM | POA: Insufficient documentation

## 2020-10-07 MED ORDER — BEVACIZUMAB CHEMO INJECTION 1.25MG/0.05ML SYRINGE FOR KALEIDOSCOPE
1.2500 mg | INTRAVITREAL | Status: AC | PRN
  Administered 2020-10-07: 15:00:00 1.25 mg via INTRAVITREAL

## 2020-10-07 NOTE — Progress Notes (Signed)
[facility] Note  10/07/2020     CHIEF COMPLAINT Patient presents for Retina Follow Up   HISTORY OF PRESENT ILLNESS: [name] is a 86 y.o. female who presents to the clinic today for:  HPI    Retina Follow Up    Patient presents with  CRVO/BRVO.  In right eye.  This started months ago.  Severity is severe.  Duration of 4 weeks.  Since onset it is stable.  I, the attending physician,  performed the HPI with the patient and updated documentation appropriately.          Comments    86 y/o female pt here for 4 wk f/u for BRVO OD.  No change in [place] OU.  Denies pain, FOL, floaters.  Dorzolamide/Timolol and Brimonidine BID OU, Latanoprost QHS OD.       Last edited by Bernarda Caffey, MD on 10/07/2020  2:55 PM. (History)      Referring physician: Jani Gravel, MD [address] [place],   [id number]  HISTORICAL INFORMATION:   Selected notes from the MEDICAL RECORD NUMBER Referred by Dr. Midge Aver for concern of RVO OS   CURRENT MEDICATIONS: Current Outpatient Medications (Ophthalmic Drugs)  Medication Sig  . atropine 1 % ophthalmic solution Place one drop into the right eye 2 (two) times daily for 14 days. Do not take tonight.  [name] will evaluate your post-operative status tomorrow and may make changes to the duration and dose of this medication.  . bimatoprost (LUMIGAN) 0.01 % SOLN Place 1 drop into the right eye at bedtime.   . brimonidine (ALPHAGAN) 0.2 % ophthalmic solution brimonidine 0.2 % eye drops  INSTILL 1 DROP INTO BOTH EYES TWICE A DAY  . dorzolamide-timolol (COSOPT) 22.3-6.8 MG/ML ophthalmic solution Place 1 drop into both eyes 2 (two) times daily.   Marland Kitchen ketorolac (ACULAR) 0.5 % ophthalmic solution Place one drop into the right eye 4 (four) times daily for 28 days.  Marland Kitchen latanoprost (XALATAN) 0.005 % ophthalmic solution latanoprost 0.005 % eye drops  INSTILL 1 DROP INTO RIGHT EYE AT BEDTIME  . moxifloxacin (VIGAMOX) 0.5  % ophthalmic solution Apply 1 drop to eye 3 (three) times daily.  Marland Kitchen neomycin-polymyxin-dexameth (MAXITROL) 0.1 % OINT Do not use tonight!   Apply a strip of ointment the size of a grain of rice to RIGHT eye at bedtime. This must be the last eye medicine of the day.   [name] will evaluate your post-operative status tomorrow and may make changes to the duration and dose of this medication.  . Netarsudil Dimesylate 0.02 % SOLN Rhopressa 0.02 % eye drops  INSTILL 1 DROP INTO AFFECTED EYE(S) BY OPHTHALMIC ROUTE ONCE DAILY INTHE EVENING  . ofloxacin (OCUFLOX) 0.3 % ophthalmic solution 1 drop into affected eye  . Polyethyl Glycol-Propyl Glycol (SYSTANE OP) Apply 1 drop to eye 2 (two) times daily.  . prednisoLONE acetate (PRED FORTE) 1 % ophthalmic suspension 1 gtt  4 times a day for one week, then 3 times a day for one week, then 2 times a day for one week, then 1 time a day for one week, then stop right eye.  . prednisoLONE acetate (PRED FORTE) 1 % ophthalmic suspension    No current facility-administered medications for this visit. (Ophthalmic Drugs)   Current Outpatient Medications (Other)  Medication Sig  . Acetaminophen 325 MG CAPS Take 325 mg by mouth 3 (three) times daily as needed.  Marland Kitchen aspirin 81  MG chewable tablet Chew 81 mg by mouth daily.  Marland Kitchen atorvastatin (LIPITOR) 80 MG tablet Take 1 tablet (80 mg total) by mouth daily at 6 PM.  . Calcium Carbonate-Vitamin D (CALTRATE 600+D PO) Take 1 tablet by mouth 2 (two) times daily.  . cefUROXime (CEFTIN) 500 MG tablet 1 tablet  . cephALEXin (KEFLEX) 500 MG capsule Take 1 capsule (500 mg total) by mouth 2 (two) times daily.  . chlorpheniramine (CHLOR-TRIMETON) 4 MG tablet Take 4 mg by mouth every 4 (four) hours as needed for allergies.  Marland Kitchen denosumab (PROLIA) 60 MG/ML SOSY injection See admin instructions.  . diclofenac sodium (VOLTAREN) 1 % GEL diclofenac 1 % topical gel  AS DIRECTED TWICE A DAY AS NEEDED TRANSDERMAL 14 DAYS  . famotidine  (PEPCID) 20 MG tablet Take 20 mg by mouth daily.  Marland Kitchen FLUZONE HIGH-DOSE QUADRIVALENT 0.7 ML SUSY   . furosemide (LASIX) 20 MG tablet Take 20 mg by mouth as needed.   . hydrocortisone 2.5 % cream 1 application to affected area as needed  . losartan (COZAAR) 25 MG tablet Take 50 mg by mouth daily.  Marland Kitchen losartan (COZAAR) 50 MG tablet   . metoprolol (LOPRESSOR) 50 MG tablet Take 25 mg by mouth 2 (two) times daily.  . Multiple Vitamins-Minerals (MULTIPLE VITAMINS/WOMENS PO) Take 1 tablet by mouth daily. W/vitamin D  . MYRBETRIQ 25 MG TB24 tablet Take 25 mg by mouth daily.  . pantoprazole (PROTONIX) 40 MG tablet Take 1 tablet (40 mg total) by mouth daily. Take 30-60 min before first meal of the day  . Pediatric Multivitamins-Iron (FLINTSTONES W/IRON) 18 MG CHEW 2 tablets  . predniSONE (DELTASONE) 5 MG tablet 4 tablet  . Zoster Vaccine Adjuvanted [facility]) injection Shingrix (PF) 50 mcg/0.5 mL intramuscular suspension, kit   No current facility-administered medications for this visit. (Other)      REVIEW OF SYSTEMS: ROS    Positive for: Neurological, Genitourinary, Musculoskeletal, Cardiovascular, Eyes   Negative for: Constitutional, Gastrointestinal, Skin, HENT, Endocrine, Respiratory, Psychiatric, Allergic/Imm, Heme/Lymph   Last edited by Matthew Folks, COA on 10/07/2020  1:21 PM. (History)       ALLERGIES Allergies  Allergen Reactions  . Meloxicam Hives  . Other Other (See Comments)  . Guaifenesin Er Rash  . Nitrofuran Derivatives Rash    PAST MEDICAL HISTORY Past Medical History:  Diagnosis Date  . Anemia   . Arthritis   . Glaucoma    OU  . Gout, joint   . Hypertension   . Hypertensive retinopathy    OU  . Macular degeneration    OU  . NSTEMI (non-ST elevated myocardial infarction) ([place]) 08/2015  . Stroke [facility])    Past Surgical History:  Procedure Laterality Date  . ABDOMINAL HYSTERECTOMY    . BREAST SURGERY     CYST  . CARDIAC CATHETERIZATION N/A 08/18/2015    Procedure: Left Heart Cath and Coronary Angiography;  Surgeon: Troy Sine, MD;  Location: [place] [facility];  Service: Cardiovascular;  Laterality: N/A;  . CARDIAC CATHETERIZATION  08/18/2015   Procedure: Coronary Stent Intervention;  Surgeon: Troy Sine, MD;  Location: [place] [facility];  Service: Cardiovascular;;  . CATARACT EXTRACTION Bilateral   . DILATION AND CURETTAGE OF UTERUS    . EYE SURGERY Bilateral   . HERNIA REPAIR    . IRIDOTOMY / IRIDECTOMY Right   . JOINT REPLACEMENT  left knee  . REFRACTIVE SURGERY Right 2017    FAMILY HISTORY Family History  Problem Relation  Age of Onset  . Heart disease Mother   . Stroke Mother     SOCIAL HISTORY Social History   Tobacco Use  . Smoking status: Never Smoker  . Smokeless tobacco: Never Used  Substance Use Topics  . Alcohol use: No    Alcohol/week: 0.0 standard drinks  . Drug use: No         OPHTHALMIC EXAM:  Base Eye Exam    Visual Acuity (Snellen - Linear)      Right Left   Dist [place] CF @ 3' 20/40 +   Dist ph [place] NI NI       Tonometry (Tonopen, 1:24 PM)      Right Left   Pressure 8 9       Pupils      Dark Light Shape React APD   Right 3 2 Round Minimal None   Left 3 2 Round Minimal None       Visual Fields (Counting fingers)      Left Right    Full    Restrictions  Total superior temporal, inferior temporal, superior nasal deficiencies       Extraocular Movement      Right Left    Full, Ortho Full, Ortho       Neuro/Psych    Oriented x3: Yes   Mood/Affect: Normal       Dilation    Both eyes: 1.0% Mydriacyl, 2.5% Phenylephrine @ 1:24 PM        Slit Lamp and Fundus Exam    Slit Lamp Exam      Right Left   Lids/Lashes Dermatochalasis - upper lid, Telangiectasia, Meibomian gland dysfunction Dermatochalasis - upper lid, mild Meibomian gland dysfunction   Conjunctiva/Sclera Trace Injection Trace Injection   Cornea Arcus, 3+ Punctate epithelial erosions, Well healed cataract wounds,  mild Debris in tear film Arcus, 2-3+ inferior Punctate epithelial erosions, Well healed cataract wounds   Anterior Chamber Deep and quiet Deep and quiet   Iris Round and poorly dilated to 85m, No NVI Round and moderately dilated to 545m  Lens PC IOL in good position with open PC PC IOL in good position with open PC   Vitreous Vitreous syneresis, Posterior vitreous detachment, vitreous condensations Vitreous syneresis, Posterior vitreous detachment       Fundus Exam      Right Left   Disc Tilted disc, thin inferior rim, +cupping, 3+pallor, 360 PPA, inferior vascular loops 1+ Pallor, Sharp rim, +cupping, temporal Peripapillary atrophy   C/D Ratio 0.9 0.6   Macula Blunted foveal reflex, inferior edema and IRH, central pigment clumping -- slightly improved from prior Flat, Blunted foveal reflex, RPE mottling and clumping, No heme or edema   Vessels Vascular attenuation, Tortuous Vascular attenuation, mild Tortuousity   Periphery Attached, inferior IRH/DBH improving Attached, No heme           IMAGING AND PROCEDURES  Imaging and Procedures for '@TODAY' @  OCT, Retina - OU - Both Eyes       Right Eye Quality was poor. Progression has been stable. Findings include subretinal hyper-reflective material, intraretinal fluid, intraretinal hyper-reflective material, epiretinal membrane, no SRF, outer retinal atrophy, abnormal foveal contour (Poor image quality -- persistent IRF - still massive).   Left Eye Quality was borderline. Central Foveal Thickness: 242. Progression has been stable. Findings include normal foveal contour, no IRF, no SRF, retinal drusen .   Notes *Images captured and stored on drive  Diagnosis / Impression:  OD: Poor image quality --  persistent IRF - still massive OS: NFP, no IRF/SRF, +drusen  Clinical management:  See below  Abbreviations: NFP - Normal foveal profile. CME - cystoid macular edema. PED - pigment epithelial detachment. IRF - intraretinal fluid. SRF -  subretinal fluid. EZ - ellipsoid zone. ERM - epiretinal membrane. ORA - outer retinal atrophy. ORT - outer retinal tubulation. SRHM - subretinal hyper-reflective material        Intravitreal Injection, Pharmacologic Agent - OD - Right Eye       Time Out 10/07/2020. 2:09 PM. Confirmed correct patient, procedure, site, and patient consented.   Anesthesia Topical anesthesia was used. Anesthetic medications included Lidocaine 2%, Proparacaine 0.5%.   Procedure Preparation included eyelid speculum, 5% betadine to ocular surface. A supplied needle was used.   Injection:  1.25 mg Bevacizumab (AVASTIN) 1.52m/0.05mL SOLN   NDC: 70360-001-02, Lot:: [id number] Expiration date: 11/09/2020   Route: Intravitreal, Site: Right Eye, Waste: 0.05 mL  Post-op Post injection exam found visual acuity of at least counting fingers. The patient tolerated the procedure well. There were no complications. The patient received written and verbal post procedure care education.                 ASSESSMENT/PLAN:    ICD-10-CM   1. Branch retinal vein occlusion of right eye with macular edema  H34.8310 Intravitreal Injection, Pharmacologic Agent - OD - Right Eye    Bevacizumab (AVASTIN) SOLN 1.25 mg  2. Retinal edema  H35.81 OCT, Retina - OU - Both Eyes  3. Exudative age-related macular degeneration of right eye with active choroidal neovascularization ([place]  H35.3211   4. Intermediate stage nonexudative age-related macular degeneration of left eye  H35.3122   5. Essential hypertension  I10   6. Hypertensive retinopathy of both eyes  H35.033   7. Primary open angle glaucoma (POAG) of both eyes, severe stage  H40.1133   8. Pseudophakia of both eyes  Z96.1     1,2. Inferior HRVO w/ CME, OD  - pt lost to f/u from 10/16/2019 to 07/14/2020 (9 mos)  - presenting BCVA CF 2' (09.14.20)  - pt subjectively reports decline in vision OD since Feb 2020  - presenting OCT shows severe CME/IRF temporal macula, +SRF  overlying low PED (?exudative ARMD component)  - S/p IVA OD #1 09.14.20, #2 (10.20.20), #3 (12.02.20), #4 (01.06.21), #5 (10.05.21), #6 (11.03.21), #7 (12.01.21)  - OCT -- Poor image quality -- persistent CME inferior macula  - discussed findings, poor prognosis and treatment options             - Recommend IVA OD #8 today, 12.29.21 for CME and prevention of further damage; discussed limited benefit to visual acuity OD  - pt wishes to proceed  - RBA of procedure discussed, questions answered  - informed consent obtained and signed  - see procedure note  - f/u 4 weeks, DFE, OCT, possible injection  3. Exudative age related macular degeneration, OD    - initial OCT showed +IRF/SRF -- SRF overlying low lying PED  - exam shows +CNVM with surrounding heme -- improved today  - suspect exudative ARMD component complicating HRVO w/ CME  - S/p IVA OD #1 (09.14.20), #2 (10.20.20), #3 (12.02.20), #4 (01.06.21), #5 (10.05.21), #6 (11.03.21), #7 (12.01.21)  - recommend IVA OD #8 as above  - f/u in 4 weeks  4. Age related macular degeneration, non-exudative, OS  - The incidence, anatomy, and pathology of dry AMD, risk of progression, and the AREDS and AREDS 2 study  including smoking risks discussed with patient.  - recommend [place] grid monitoring  5,6. Hypertensive retinopathy OU  - discussed importance of tight BP control  - monitor  7. POAG OU -- severe stage  - under the expert management of Dr. Midge Aver  - s/p SLT OD 3.23.2017  - s/p TDC OD w/ [name], 3.30.21  - IOP good today at 7,8  - pt on latnoprost qhs OD, dorzolamide/timolol bid OU, brimonidine bid OU  8. Pseudophakia OU  - s/p CE/IOL OU  - beautiful surgeries, doing well  - monitor   Ophthalmic Meds Ordered this visit:  Meds ordered this encounter  Medications  . Bevacizumab (AVASTIN) SOLN 1.25 mg       Return in about 4 weeks (around 11/04/2020) for HRVO OD - Dilated Exam, OCT, Possible Injxn.  There are no  Patient Instructions on file for this visit.   Explained the diagnoses, plan, and follow up with the patient and they expressed understanding.  Patient expressed understanding of the importance of proper follow up care.   This document serves as a record of services personally performed by Gardiner Sleeper, MD, PhD. It was created on their behalf by Roselee Nova, COMT. The creation of this record is the provider's dictation and/or activities during the visit.  Electronically signed by: Roselee Nova, COMT 10/07/20 2:57 PM   This document serves as a record of services personally performed by Gardiner Sleeper, MD, PhD. It was created on their behalf by San Jetty. Owens Shark, OA an ophthalmic technician. The creation of this record is the provider's dictation and/or activities during the visit.    Electronically signed by: San Jetty. Owens Shark, [place] 12.29.2021 2:57 PM  Gardiner Sleeper, M.D., Ph.D. Diseases & Surgery of the Retina and [place] 10/07/2020   I have reviewed the above documentation for accuracy and completeness, and I agree with the above. Gardiner Sleeper, M.D., Ph.D. 10/07/20 2:57 PM  Abbreviations: M myopia (nearsighted); A astigmatism; H hyperopia (farsighted); P presbyopia; Mrx spectacle prescription;  CTL contact lenses; OD right eye; OS left eye; OU both eyes  XT exotropia; ET esotropia; PEK punctate epithelial keratitis; PEE punctate epithelial erosions; DES dry eye syndrome; MGD meibomian gland dysfunction; ATs artificial tears; PFAT's preservative free artificial tears; [place] nuclear sclerotic cataract; PSC posterior subcapsular cataract; ERM epi-retinal membrane; PVD posterior vitreous detachment; RD retinal detachment; DM diabetes mellitus; DR diabetic retinopathy; NPDR non-proliferative diabetic retinopathy; PDR proliferative diabetic retinopathy; CSME clinically significant macular edema; DME diabetic macular edema; dbh dot blot hemorrhages; CWS cotton  wool spot; POAG primary open angle glaucoma; C/D cup-to-disc ratio; HVF humphrey visual field; GVF goldmann visual field; OCT optical coherence tomography; IOP intraocular pressure; BRVO Branch retinal vein occlusion; CRVO central retinal vein occlusion; CRAO central retinal artery occlusion; BRAO branch retinal artery occlusion; RT retinal tear; SB scleral buckle; PPV pars plana vitrectomy; VH Vitreous hemorrhage; PRP panretinal laser photocoagulation; IVK intravitreal kenalog; VMT vitreomacular traction; MH Macular hole;  NVD neovascularization of the disc; NVE neovascularization elsewhere; AREDS age related eye disease study; ARMD age related macular degeneration; POAG primary open angle glaucoma; EBMD epithelial/anterior basement membrane dystrophy; ACIOL anterior chamber intraocular lens; IOL intraocular lens; PCIOL posterior chamber intraocular lens; Phaco/IOL phacoemulsification with intraocular lens placement; [place] photorefractive keratectomy; LASIK laser assisted in situ keratomileusis; HTN hypertension; DM diabetes mellitus; COPD chronic obstructive pulmonary disease

## 2020-10-08 DIAGNOSIS — M810 Age-related osteoporosis without current pathological fracture: Secondary | ICD-10-CM | POA: Diagnosis not present

## 2020-10-09 ENCOUNTER — Encounter: Payer: Self-pay | Admitting: Podiatry

## 2020-10-09 NOTE — Progress Notes (Signed)
Subjective:  Patient ID: ADRIENA MANFRE, female    DOB: 1923-07-16,  MRN: [id number]  86 y.o. female is seen for follow up ulcer submet head 4 right foot.  Daughter is present during today's visit and accompanies her on every visit.  Her bunion area continues to feel good.  Daughter states her foot looks better today.  She wears off-loaded Darco shoe at home mostly and had new Peg Assist system applied on last visit on 06/03/2020.Daughter is requesting a new Darco shoe due to current one being worn on the sole.  Wound location: submet head 4 right foot. They deny any redness, drainage or swelling around wound.  Patient denies fever, chills, night sweats, nausea, or vomiting.  Past Medical History:  Diagnosis Date  . Anemia   . Arthritis   . Glaucoma    OU  . Gout, joint   . Hypertension   . Hypertensive retinopathy    OU  . Macular degeneration    OU  . NSTEMI (non-ST elevated myocardial infarction) ([place]) 08/2015  . Stroke [facility])      Past Surgical History:  Procedure Laterality Date  . ABDOMINAL HYSTERECTOMY    . BREAST SURGERY     CYST  . CARDIAC CATHETERIZATION N/A 08/18/2015   Procedure: Left Heart Cath and Coronary Angiography;  Surgeon: Troy Sine, MD;  Location: [place] [facility];  Service: Cardiovascular;  Laterality: N/A;  . CARDIAC CATHETERIZATION  08/18/2015   Procedure: Coronary Stent Intervention;  Surgeon: Troy Sine, MD;  Location: [place] [facility];  Service: Cardiovascular;;  . CATARACT EXTRACTION Bilateral   . DILATION AND CURETTAGE OF UTERUS    . EYE SURGERY Bilateral   . HERNIA REPAIR    . IRIDOTOMY / IRIDECTOMY Right   . JOINT REPLACEMENT  left knee  . REFRACTIVE SURGERY Right 2017     Current Outpatient Medications on File Prior to Visit  Medication Sig Dispense Refill  . Acetaminophen 325 MG CAPS Take 325 mg by mouth 3 (three) times daily as needed.    Marland Kitchen aspirin 81 MG chewable tablet Chew 81 mg by mouth daily.    Marland Kitchen atorvastatin (LIPITOR)  80 MG tablet Take 1 tablet (80 mg total) by mouth daily at 6 PM. 60 tablet 0  . atropine 1 % ophthalmic solution Place one drop into the right eye 2 (two) times daily for 14 days. Do not take tonight.  [name] will evaluate your post-operative status tomorrow and may make changes to the duration and dose of this medication.    . bimatoprost (LUMIGAN) 0.01 % SOLN Place 1 drop into the right eye at bedtime.     . brimonidine (ALPHAGAN) 0.2 % ophthalmic solution brimonidine 0.2 % eye drops  INSTILL 1 DROP INTO BOTH EYES TWICE A DAY    . Calcium Carbonate-Vitamin D (CALTRATE 600+D PO) Take 1 tablet by mouth 2 (two) times daily.    . cefUROXime (CEFTIN) 500 MG tablet 1 tablet    . cephALEXin (KEFLEX) 500 MG capsule Take 1 capsule (500 mg total) by mouth 2 (two) times daily. 14 capsule 0  . chlorpheniramine (CHLOR-TRIMETON) 4 MG tablet Take 4 mg by mouth every 4 (four) hours as needed for allergies.    Marland Kitchen denosumab (PROLIA) 60 MG/ML SOSY injection See admin instructions.    . diclofenac sodium (VOLTAREN) 1 % GEL diclofenac 1 % topical gel  AS DIRECTED TWICE A DAY AS NEEDED TRANSDERMAL 14 DAYS    . dorzolamide-timolol (COSOPT)  22.3-6.8 MG/ML ophthalmic solution Place 1 drop into both eyes 2 (two) times daily.     . famotidine (PEPCID) 20 MG tablet Take 20 mg by mouth daily.    Marland Kitchen FLUZONE HIGH-DOSE QUADRIVALENT 0.7 ML SUSY     . furosemide (LASIX) 20 MG tablet Take 20 mg by mouth as needed.     . hydrocortisone 2.5 % cream 1 application to affected area as needed    . ketorolac (ACULAR) 0.5 % ophthalmic solution Place one drop into the right eye 4 (four) times daily for 28 days.    Marland Kitchen latanoprost (XALATAN) 0.005 % ophthalmic solution latanoprost 0.005 % eye drops  INSTILL 1 DROP INTO RIGHT EYE AT BEDTIME    . losartan (COZAAR) 25 MG tablet Take 50 mg by mouth daily.    Marland Kitchen losartan (COZAAR) 50 MG tablet     . metoprolol (LOPRESSOR) 50 MG tablet Take 25 mg by mouth 2 (two) times daily.    Marland Kitchen moxifloxacin  (VIGAMOX) 0.5 % ophthalmic solution Apply 1 drop to eye 3 (three) times daily.    . Multiple Vitamins-Minerals (MULTIPLE VITAMINS/WOMENS PO) Take 1 tablet by mouth daily. W/vitamin D    . MYRBETRIQ 25 MG TB24 tablet Take 25 mg by mouth daily.    Marland Kitchen neomycin-polymyxin-dexameth (MAXITROL) 0.1 % OINT Do not use tonight!   Apply a strip of ointment the size of a grain of rice to RIGHT eye at bedtime. This must be the last eye medicine of the day.   [name] will evaluate your post-operative status tomorrow and may make changes to the duration and dose of this medication.    . Netarsudil Dimesylate 0.02 % SOLN Rhopressa 0.02 % eye drops  INSTILL 1 DROP INTO AFFECTED EYE(S) BY OPHTHALMIC ROUTE ONCE DAILY INTHE EVENING    . ofloxacin (OCUFLOX) 0.3 % ophthalmic solution 1 drop into affected eye    . pantoprazole (PROTONIX) 40 MG tablet Take 1 tablet (40 mg total) by mouth daily. Take 30-60 min before first meal of the day 30 tablet 2  . Pediatric Multivitamins-Iron (FLINTSTONES W/IRON) 18 MG CHEW 2 tablets    . Polyethyl Glycol-Propyl Glycol (SYSTANE OP) Apply 1 drop to eye 2 (two) times daily.    . prednisoLONE acetate (PRED FORTE) 1 % ophthalmic suspension 1 gtt  4 times a day for one week, then 3 times a day for one week, then 2 times a day for one week, then 1 time a day for one week, then stop right eye.    . prednisoLONE acetate (PRED FORTE) 1 % ophthalmic suspension     . predniSONE (DELTASONE) 5 MG tablet 4 tablet    . Zoster Vaccine Adjuvanted [facility]) injection Shingrix (PF) 50 mcg/0.5 mL intramuscular suspension, kit     No current facility-administered medications on file prior to visit.     Allergies  Allergen Reactions  . Meloxicam Hives  . Other Other (See Comments)  . Guaifenesin Er Rash  . Nitrofuran Derivatives Rash     Family History  Problem Relation Age of Onset  . Heart disease Mother   . Stroke Mother      Social History   Occupational History    Comment:  retired  Tobacco Use  . Smoking status: Never Smoker  . Smokeless tobacco: Never Used  Substance and Sexual Activity  . Alcohol use: No    Alcohol/week: 0.0 standard drinks  . Drug use: No  . Sexual activity: Never    Birth control/protection:  Post-menopausal     Immunization History  Administered Date(s) Administered  . Influenza Whole 06/10/2017  . Influenza, High Dose Seasonal PF 07/25/2014, 07/07/2015, 07/07/2016, 06/22/2017, 07/14/2019  . Influenza, Quadrivalent, Recombinant, Inj, Pf 07/05/2018, 08/03/2020  . Influenza,inj,quad, With Preservative 07/14/2019  . Influenza-Unspecified 07/26/2017, 07/26/2018  . PFIZER SARS-COV-2 Vaccination 11/16/2019, 12/11/2019, 07/28/2020  . Pneumococcal Conjugate-13 03/26/2015  . Pneumococcal Polysaccharide-23 09/28/2012  . Tdap 09/25/2012  . Zoster 11/01/2018  . Zoster Recombinat (Shingrix) 11/01/2018   Objective:  Physical Exam: There were no vitals filed for this visit.   86 y.o. female pleasant Caucasian female in NAD. AAO x 3.  Neurovascular status unchanged b/l. Capillary fill time to digits <3 seconds b/l. Palpable DP pulses b/l. Palpable PT pulses b/l. Pedal hair absent b/l Skin temperature gradient within normal limits b/l. Nonpitting edema noted b/l LE.  Pedal skin with normal turgor, texture and tone bilaterally. No interdigital macerations bilaterally. Toenails 1-5 b/l well maintained with adequate length. No erythema, no edema, no drainage, no flocculence. No erythema, no edema, no drainage, no flocculence.   Wound Location: ball of right foot There is a moderate amount of devitalized hyperkeratotic tissue present.   Predebridement Wound Measurement: 0.3 x 0.2 cm with hyperkeratotic roof and subdermal hemorrhage Postdebridement Wound Measurement: 0.3 x 0.2 x 0.1 cm and is partial thickness submet head 3 right foot.   Wound Base:  Epithelial Peri-wound: Calloused Exudate: None: wound tissue dry Blood Loss during debridement:  0 cc's. Description of tissue removed from ulceration today:  Hyperkeratosis and devitalized subcutaneous tissue. Signs of clinical bacterial infection are absent. Material in wound which inhibits healing/promotes adjacent tissue breakdown: Hyperkeratosis  Normal muscle strength 5/5 to all lower extremity muscle groups bilaterally. No pain crepitus or joint limitation noted with ROM b/l. Plantarflexed metatarsal(s) right lower extremity. Hallux valgus with bunion deformity noted b/l lower extremities. Hammertoes noted to the 2-5 bilaterally. Noted plantar fat pad atrophy of forefoot area b/l lower extremities.  Protective sensation diminished with 10g monofilament b/l. Proprioception intact bilaterally.  Assessment:   1. Neurogenic ulcer, limited to breakdown of skin ([place])   2. Neuropathy    Plan:  -Patient was evaluated and treated and all questions answered.  -Porokeratotic lesion right 1st metatarsal head: continue daily use of tubefoam bunion shield. -Patient/POA/Family member educated on diagnosis and treatment plan of routine ulcer debridement/wound care.  -Dispensed new Darco shoe for right foot today.  -Type/amount of devitalized tissue removed: devitalized hyperkeratotic tissue. -Today's postdebridement measurements: 0.3 x 0.2 cm submet head 3 right foot ulcer size post-debridement: Ulcer cleansed with wound cleanser. Triple antibiotic ointment and band-aid applied. -New Peg Assist was added to Darco shoe right foot on 06/03/2020. -Wound responded well to today's debridement. -Patient risk factors affecting healing of ulcer: foot deformity, neuropathy CKD, fat pad atrophy b/l feet, recurrent ulceration. -Daughter is well experienced/educated onf the chronic ulceration. She was instructed to apply Iodosorb Gel to wounds once daily if they start to drain. If it opens and does not drain, she may apply the Medihoney. -Frequency of debridement needed to achieve healing: every 2-3 weeks to  prevent breakdown. Daughter knows to call office for myself or Dr. Jacqualyn Posey if condition worsens. -Call office if condition worsens or Ms. Mccumbers experiences any increased redness, drainage, swelling, warmth of right foot, fever, chills, night sweats, nausea, vomiting  Return in about 3 weeks (around 10/28/2020) for Right foot neurogenic ulcer check.  Marzetta Board, DPM

## 2020-10-21 DIAGNOSIS — M25551 Pain in right hip: Secondary | ICD-10-CM | POA: Diagnosis not present

## 2020-10-21 DIAGNOSIS — Z9181 History of falling: Secondary | ICD-10-CM | POA: Diagnosis not present

## 2020-10-21 DIAGNOSIS — M549 Dorsalgia, unspecified: Secondary | ICD-10-CM | POA: Diagnosis not present

## 2020-10-21 DIAGNOSIS — R350 Frequency of micturition: Secondary | ICD-10-CM | POA: Diagnosis not present

## 2020-10-21 DIAGNOSIS — M25552 Pain in left hip: Secondary | ICD-10-CM | POA: Diagnosis not present

## 2020-10-21 DIAGNOSIS — N390 Urinary tract infection, site not specified: Secondary | ICD-10-CM | POA: Diagnosis not present

## 2020-10-21 DIAGNOSIS — M545 Low back pain, unspecified: Secondary | ICD-10-CM | POA: Diagnosis not present

## 2020-10-22 DIAGNOSIS — E875 Hyperkalemia: Secondary | ICD-10-CM | POA: Diagnosis not present

## 2020-10-28 ENCOUNTER — Ambulatory Visit (INDEPENDENT_AMBULATORY_CARE_PROVIDER_SITE_OTHER): Payer: Medicare Other | Admitting: Podiatry

## 2020-10-28 ENCOUNTER — Other Ambulatory Visit: Payer: Self-pay

## 2020-10-28 ENCOUNTER — Encounter: Payer: Self-pay | Admitting: Podiatry

## 2020-10-28 DIAGNOSIS — L97511 Non-pressure chronic ulcer of other part of right foot limited to breakdown of skin: Secondary | ICD-10-CM | POA: Diagnosis not present

## 2020-10-28 DIAGNOSIS — M2042 Other hammer toe(s) (acquired), left foot: Secondary | ICD-10-CM | POA: Diagnosis not present

## 2020-10-28 DIAGNOSIS — E08621 Diabetes mellitus due to underlying condition with foot ulcer: Secondary | ICD-10-CM | POA: Diagnosis not present

## 2020-10-28 DIAGNOSIS — M2012 Hallux valgus (acquired), left foot: Secondary | ICD-10-CM

## 2020-10-28 DIAGNOSIS — M2041 Other hammer toe(s) (acquired), right foot: Secondary | ICD-10-CM | POA: Diagnosis not present

## 2020-10-28 DIAGNOSIS — M2011 Hallux valgus (acquired), right foot: Secondary | ICD-10-CM

## 2020-10-28 DIAGNOSIS — E119 Type 2 diabetes mellitus without complications: Secondary | ICD-10-CM | POA: Diagnosis not present

## 2020-10-28 DIAGNOSIS — E1142 Type 2 diabetes mellitus with diabetic polyneuropathy: Secondary | ICD-10-CM

## 2020-10-28 DIAGNOSIS — E875 Hyperkalemia: Secondary | ICD-10-CM | POA: Diagnosis not present

## 2020-10-28 NOTE — Progress Notes (Signed)
Subjective:  Patient ID: [name], female    DOB: [birth year],  MRN: [id number]  86 y.o. female is seen for follow up ulcer ball of right foot.  Daughter, [name],  is present during today's visit and accompanies her on every visit.  She is having no bunion pain on the right foot today.  Daughter, [name], states [name] has been wearing house slippers at home instead of her [place]. Also states [name] is starting to complain of pain in toes of the left foot and suspects it's due to her toes hitting the front of her shoe.   New Peg Assist was added to Darco shoe right foot on 06/03/2020  [name] also says [name] has been diagnosed with diabetes.   Wound location: ball of right foot. There is tenderness on today's visit. No active drainage, no odor.  Patient denies fever, chills, night sweats, nausea, or vomiting.  She is taking cephalexin for UTI.  Past Medical History:  Diagnosis Date  . Anemia   . Arthritis   . Glaucoma    OU  . Gout, joint   . Hypertension   . Hypertensive retinopathy    OU  . Macular degeneration    OU  . NSTEMI (non-ST elevated myocardial infarction) ([place]) 08/2015  . Stroke [facility])      Past Surgical History:  Procedure Laterality Date  . ABDOMINAL HYSTERECTOMY    . BREAST SURGERY     CYST  . CARDIAC CATHETERIZATION N/A 08/18/2015   Procedure: Left Heart Cath and Coronary Angiography;  Surgeon: Troy Sine, MD;  Location: [place] [facility];  Service: Cardiovascular;  Laterality: N/A;  . CARDIAC CATHETERIZATION  08/18/2015   Procedure: Coronary Stent Intervention;  Surgeon: Troy Sine, MD;  Location: [place] [facility];  Service: Cardiovascular;;  . CATARACT EXTRACTION Bilateral   . DILATION AND CURETTAGE OF UTERUS    . EYE SURGERY Bilateral   . HERNIA REPAIR    . IRIDOTOMY / IRIDECTOMY Right   . JOINT REPLACEMENT  left knee  . REFRACTIVE SURGERY Right 2017     Current Outpatient Medications on File Prior to Visit  Medication Sig  Dispense Refill  . Acetaminophen 325 MG CAPS Take 325 mg by mouth 3 (three) times daily as needed.    Marland Kitchen aspirin 81 MG chewable tablet Chew 81 mg by mouth daily.    Marland Kitchen atorvastatin (LIPITOR) 80 MG tablet Take 1 tablet (80 mg total) by mouth daily at 6 PM. 60 tablet 0  . atropine 1 % ophthalmic solution Place one drop into the right eye 2 (two) times daily for 14 days. Do not take tonight.  [name] will evaluate your post-operative status tomorrow and may make changes to the duration and dose of this medication.    . bimatoprost (LUMIGAN) 0.01 % SOLN Place 1 drop into the right eye at bedtime.     . brimonidine (ALPHAGAN) 0.2 % ophthalmic solution brimonidine 0.2 % eye drops  INSTILL 1 DROP INTO BOTH EYES TWICE A DAY    . Calcium Carbonate-Vitamin D (CALTRATE 600+D PO) Take 1 tablet by mouth 2 (two) times daily.    . cefUROXime (CEFTIN) 500 MG tablet 1 tablet    . cephALEXin (KEFLEX) 500 MG capsule Take 1 capsule (500 mg total) by mouth 2 (two) times daily. 14 capsule 0  . chlorpheniramine (CHLOR-TRIMETON) 4 MG tablet Take 4 mg by mouth every 4 (four) hours as needed for allergies.    Marland Kitchen  denosumab (PROLIA) 60 MG/ML SOSY injection See admin instructions.    . diclofenac sodium (VOLTAREN) 1 % GEL diclofenac 1 % topical gel  AS DIRECTED TWICE A DAY AS NEEDED TRANSDERMAL 14 DAYS    . dorzolamide-timolol (COSOPT) 22.3-6.8 MG/ML ophthalmic solution Place 1 drop into both eyes 2 (two) times daily.     . famotidine (PEPCID) 20 MG tablet Take 20 mg by mouth daily.    Marland Kitchen FLUZONE HIGH-DOSE QUADRIVALENT 0.7 ML SUSY     . furosemide (LASIX) 20 MG tablet Take 20 mg by mouth as needed.     . hydrocortisone 2.5 % cream 1 application to affected area as needed    . ketorolac (ACULAR) 0.5 % ophthalmic solution Place one drop into the right eye 4 (four) times daily for 28 days.    Marland Kitchen latanoprost (XALATAN) 0.005 % ophthalmic solution latanoprost 0.005 % eye drops  INSTILL 1 DROP INTO RIGHT EYE AT BEDTIME    . losartan  (COZAAR) 25 MG tablet Take 50 mg by mouth daily.    Marland Kitchen losartan (COZAAR) 50 MG tablet     . metoprolol (LOPRESSOR) 50 MG tablet Take 25 mg by mouth 2 (two) times daily.    Marland Kitchen moxifloxacin (VIGAMOX) 0.5 % ophthalmic solution Apply 1 drop to eye 3 (three) times daily.    . Multiple Vitamins-Minerals (MULTIPLE VITAMINS/WOMENS PO) Take 1 tablet by mouth daily. W/vitamin D    . MYRBETRIQ 25 MG TB24 tablet Take 25 mg by mouth daily.    Marland Kitchen neomycin-polymyxin-dexameth (MAXITROL) 0.1 % OINT Do not use tonight!   Apply a strip of ointment the size of a grain of rice to RIGHT eye at bedtime. This must be the last eye medicine of the day.   [name] will evaluate your post-operative status tomorrow and may make changes to the duration and dose of this medication.    . Netarsudil Dimesylate 0.02 % SOLN Rhopressa 0.02 % eye drops  INSTILL 1 DROP INTO AFFECTED EYE(S) BY OPHTHALMIC ROUTE ONCE DAILY INTHE EVENING    . ofloxacin (OCUFLOX) 0.3 % ophthalmic solution 1 drop into affected eye    . pantoprazole (PROTONIX) 40 MG tablet Take 1 tablet (40 mg total) by mouth daily. Take 30-60 min before first meal of the day 30 tablet 2  . Pediatric Multivitamins-Iron (FLINTSTONES W/IRON) 18 MG CHEW 2 tablets    . Polyethyl Glycol-Propyl Glycol (SYSTANE OP) Apply 1 drop to eye 2 (two) times daily.    . prednisoLONE acetate (PRED FORTE) 1 % ophthalmic suspension 1 gtt  4 times a day for one week, then 3 times a day for one week, then 2 times a day for one week, then 1 time a day for one week, then stop right eye.    . prednisoLONE acetate (PRED FORTE) 1 % ophthalmic suspension     . predniSONE (DELTASONE) 5 MG tablet 4 tablet    . Zoster Vaccine Adjuvanted [facility]) injection Shingrix (PF) 50 mcg/0.5 mL intramuscular suspension, kit     No current facility-administered medications on file prior to visit.     Allergies  Allergen Reactions  . Meloxicam Hives  . Other Other (See Comments)  . Guaifenesin Er Rash     Other reaction(s): rash  . Nitrofuran Derivatives Rash     Family History  Problem Relation Age of Onset  . Heart disease Mother   . Stroke Mother      Social History   Occupational History    Comment: retired  Tobacco Use  . Smoking status: Never Smoker  . Smokeless tobacco: Never Used  Substance and Sexual Activity  . Alcohol use: No    Alcohol/week: 0.0 standard drinks  . Drug use: No  . Sexual activity: Never    Birth control/protection: Post-menopausal     Immunization History  Administered Date(s) Administered  . Influenza Whole 06/10/2017  . Influenza, High Dose Seasonal PF 07/25/2014, 07/07/2015, 07/07/2016, 06/22/2017, 07/14/2019  . Influenza, Quadrivalent, Recombinant, Inj, Pf 07/05/2018, 08/03/2020  . Influenza,inj,quad, With Preservative 07/14/2019  . Influenza-Unspecified 07/26/2017, 07/26/2018  . PFIZER(Purple Top)SARS-COV-2 Vaccination 11/16/2019, 12/11/2019, 07/28/2020  . Pneumococcal Conjugate-13 03/26/2015  . Pneumococcal Polysaccharide-23 09/28/2012  . Tdap 09/25/2012  . Zoster 11/01/2018  . Zoster Recombinat (Shingrix) 11/01/2018   Objective:  Physical Exam: There were no vitals filed for this visit.   87 y.o. female pleasant Caucasian female in NAD. AAO x 3.  Vascular Examination: Capillary fill time to digits <3 seconds b/l lower extremities. Palpable pedal pulses b/l LE. Pedal hair absent. Lower extremity skin temperature gradient within normal limits. Nonpitting edema noted b/l LE.Marland Kitchen  Dermatological Examination: Pedal skin with normal turgor, texture and tone bilaterally. No interdigital macerations bilaterally. Toenails 1-5 b/l well maintained with adequate length. No erythema, no edema, no drainage, no flocculence. No erythema, no edema, no drainage, no flocculence.   Wound Location: ball of right foot   There is a moderate amount of devitalized hyperkeratotic tissue present.   Predebridement Wound Measurement: 0.4 x 0.9 cm with  hyperkeratotic roof and subdermal hemorrhage Postdebridement Wound Measurement: 0.5 x 1.0 x 0.2 cm and is partial thickness submet head 3 right foot.   Wound Base:  Epithelial Peri-wound: Calloused Exudate: None: wound tissue dry Blood Loss during debridement: 0 cc's. Description of tissue removed from ulceration today:  Hyperkeratosis and devitalized subcutaneous tissue. Signs of clinical bacterial infection are absent. Material in wound which inhibits healing/promotes adjacent tissue breakdown: Hyperkeratosis  Musculoskeletal Examination: Normal muscle strength 5/5 to all lower extremity muscle groups bilaterally. No pain crepitus or joint limitation noted with ROM b/l. Plantarflexed metatarsal(s) right lower extremity. Hallux valgus with bunion deformity noted b/l lower extremities. Hammertoes noted to the 2-5 bilaterally. Noted plantar fat pad atrophy of forefoot area b/l lower extremities.  Neurological Examination: Protective sensation diminished with 10g monofilament b/l. Proprioception intact bilaterally.  Assessment:   1. Diabetic ulcer of right foot associated with diabetes mellitus due to underlying condition, limited to breakdown of skin, unspecified part of foot ([place])   2. Hallux valgus, acquired, bilateral   3. Acquired hammertoes of both feet   4. Diabetic peripheral neuropathy associated with type 2 diabetes mellitus ([place])   5. Encounter for diabetic foot exam [facility])    Plan:  -Advised patient to wear her offloaded Darco shoe at all times. Do not wear house slipper on right foot. -Patient was evaluated and treated and all questions answered.  -Porokeratotic lesion right 1st metatarsal head: continue daily use of tubefoam bunion shield. -Patient/POA/Family member educated on diagnosis and treatment plan of routine ulcer debridement/wound care.  -Dispensed new Darco shoe for right foot today.  -Type/amount of devitalized tissue removed: devitalized hyperkeratotic  tissue. -Today's postdebridement measurements: 0.5 x 1.0 x 0.2 cm submet head 3 right foot ulcer size post-debridement: Ulcer cleansed with wound cleanser. Betadine ointment and band-aid applied. -Wound responded well to today's debridement. -Patient risk factors affecting healing of ulcer: foot deformity, neuropathy CKD, fat pad atrophy b/l feet, recurrent ulceration. -Daughter is well experienced/educated on the chronic  ulceration of the right foot. -Frequency of debridement needed to achieve healing: every 2-3 weeks to prevent breakdown. Daughter knows to call office for myself or Dr. Jacqualyn Posey if condition worsens. -Call office if condition worsens or Ms. Lewey experiences any increased redness, drainage, swelling, warmth of right foot, fever, chills, night sweats, nausea, vomiting  Return in about 3 weeks (around 11/18/2020).  Marzetta Board, DPM

## 2020-10-29 DIAGNOSIS — R350 Frequency of micturition: Secondary | ICD-10-CM | POA: Diagnosis not present

## 2020-11-03 DIAGNOSIS — R296 Repeated falls: Secondary | ICD-10-CM | POA: Diagnosis not present

## 2020-11-03 DIAGNOSIS — N390 Urinary tract infection, site not specified: Secondary | ICD-10-CM | POA: Diagnosis not present

## 2020-11-04 DIAGNOSIS — H04123 Dry eye syndrome of bilateral lacrimal glands: Secondary | ICD-10-CM | POA: Diagnosis not present

## 2020-11-04 DIAGNOSIS — Z961 Presence of intraocular lens: Secondary | ICD-10-CM | POA: Diagnosis not present

## 2020-11-04 DIAGNOSIS — H34811 Central retinal vein occlusion, right eye, with macular edema: Secondary | ICD-10-CM | POA: Diagnosis not present

## 2020-11-04 DIAGNOSIS — H168 Other keratitis: Secondary | ICD-10-CM | POA: Diagnosis not present

## 2020-11-04 DIAGNOSIS — H401113 Primary open-angle glaucoma, right eye, severe stage: Secondary | ICD-10-CM | POA: Diagnosis not present

## 2020-11-04 DIAGNOSIS — H40012 Open angle with borderline findings, low risk, left eye: Secondary | ICD-10-CM | POA: Diagnosis not present

## 2020-11-04 DIAGNOSIS — H353131 Nonexudative age-related macular degeneration, bilateral, early dry stage: Secondary | ICD-10-CM | POA: Diagnosis not present

## 2020-11-05 ENCOUNTER — Other Ambulatory Visit: Payer: Self-pay

## 2020-11-05 ENCOUNTER — Ambulatory Visit (INDEPENDENT_AMBULATORY_CARE_PROVIDER_SITE_OTHER): Payer: Medicare Other | Admitting: Ophthalmology

## 2020-11-05 ENCOUNTER — Encounter (INDEPENDENT_AMBULATORY_CARE_PROVIDER_SITE_OTHER): Payer: Self-pay | Admitting: Ophthalmology

## 2020-11-05 DIAGNOSIS — H34831 Tributary (branch) retinal vein occlusion, right eye, with macular edema: Secondary | ICD-10-CM | POA: Diagnosis not present

## 2020-11-05 DIAGNOSIS — I10 Essential (primary) hypertension: Secondary | ICD-10-CM

## 2020-11-05 DIAGNOSIS — Z961 Presence of intraocular lens: Secondary | ICD-10-CM

## 2020-11-05 DIAGNOSIS — R350 Frequency of micturition: Secondary | ICD-10-CM | POA: Diagnosis not present

## 2020-11-05 DIAGNOSIS — H353211 Exudative age-related macular degeneration, right eye, with active choroidal neovascularization: Secondary | ICD-10-CM

## 2020-11-05 DIAGNOSIS — H3581 Retinal edema: Secondary | ICD-10-CM

## 2020-11-05 DIAGNOSIS — H401133 Primary open-angle glaucoma, bilateral, severe stage: Secondary | ICD-10-CM | POA: Diagnosis not present

## 2020-11-05 DIAGNOSIS — H35033 Hypertensive retinopathy, bilateral: Secondary | ICD-10-CM

## 2020-11-05 DIAGNOSIS — H353122 Nonexudative age-related macular degeneration, left eye, intermediate dry stage: Secondary | ICD-10-CM

## 2020-11-05 NOTE — Progress Notes (Signed)
[facility] Note  11/05/2020     CHIEF COMPLAINT Patient presents for Retina Follow Up   HISTORY OF PRESENT ILLNESS: [name] is a 87 y.o. female who presents to the clinic today for:  HPI    Retina Follow Up    Diagnosis: HRVO.  In right eye.  Severity is moderate.  Duration of 4 weeks.  Since onset it is stable.  I, the attending physician,  performed the HPI with the patient and updated documentation appropriately.          Comments    Patient states vision slightly worse. On latanoprost qhs OU and cosopt bid OU.       Last edited by Bernarda Caffey, MD on 11/08/2020  2:57 PM. (History)    Pt fell Monday and hit right side of head. She lost her balance and fell.  No injuries just bruising.   Referring physician: Warden Fillers, MD [address] [place],  [place] [address]  HISTORICAL INFORMATION:   Selected notes from the MEDICAL RECORD NUMBER Referred by Dr. Midge Aver for concern of RVO OS   CURRENT MEDICATIONS: Current Outpatient Medications (Ophthalmic Drugs)  Medication Sig  . atropine 1 % ophthalmic solution Place one drop into the right eye 2 (two) times daily for 14 days. Do not take tonight.  [name] will evaluate your post-operative status tomorrow and may make changes to the duration and dose of this medication.  . bimatoprost (LUMIGAN) 0.01 % SOLN Place 1 drop into the right eye at bedtime.   . brimonidine (ALPHAGAN) 0.2 % ophthalmic solution brimonidine 0.2 % eye drops  INSTILL 1 DROP INTO BOTH EYES TWICE A DAY  . dorzolamide-timolol (COSOPT) 22.3-6.8 MG/ML ophthalmic solution Place 1 drop into both eyes 2 (two) times daily.   Marland Kitchen latanoprost (XALATAN) 0.005 % ophthalmic solution latanoprost 0.005 % eye drops  INSTILL 1 DROP INTO RIGHT EYE AT BEDTIME  . moxifloxacin (VIGAMOX) 0.5 % ophthalmic solution Apply 1 drop to eye 3 (three) times daily.  Marland Kitchen neomycin-polymyxin-dexameth (MAXITROL) 0.1 % OINT Do not use  tonight!   Apply a strip of ointment the size of a grain of rice to RIGHT eye at bedtime. This must be the last eye medicine of the day.   [name] will evaluate your post-operative status tomorrow and may make changes to the duration and dose of this medication.  . Netarsudil Dimesylate 0.02 % SOLN Rhopressa 0.02 % eye drops  INSTILL 1 DROP INTO AFFECTED EYE(S) BY OPHTHALMIC ROUTE ONCE DAILY INTHE EVENING  . Polyethyl Glycol-Propyl Glycol (SYSTANE OP) Apply 1 drop to eye 2 (two) times daily.  . prednisoLONE acetate (PRED FORTE) 1 % ophthalmic suspension   . ketorolac (ACULAR) 0.5 % ophthalmic solution Place one drop into the right eye 4 (four) times daily for 28 days. (Patient not taking: Reported on 11/05/2020)  . ofloxacin (OCUFLOX) 0.3 % ophthalmic solution 1 drop into affected eye (Patient not taking: Reported on 11/05/2020)  . prednisoLONE acetate (PRED FORTE) 1 % ophthalmic suspension 1 gtt  4 times a day for one week, then 3 times a day for one week, then 2 times a day for one week, then 1 time a day for one week, then stop right eye. (Patient not taking: Reported on 11/05/2020)   No current facility-administered medications for this visit. (Ophthalmic Drugs)   Current Outpatient Medications (Other)  Medication Sig  . Acetaminophen 325 MG CAPS Take 325 mg  by mouth 3 (three) times daily as needed.  Marland Kitchen aspirin 81 MG chewable tablet Chew 81 mg by mouth daily.  Marland Kitchen atorvastatin (LIPITOR) 80 MG tablet Take 1 tablet (80 mg total) by mouth daily at 6 PM.  . Calcium Carbonate-Vitamin D (CALTRATE 600+D PO) Take 1 tablet by mouth 2 (two) times daily.  . cefUROXime (CEFTIN) 500 MG tablet 1 tablet  . cephALEXin (KEFLEX) 500 MG capsule Take 1 capsule (500 mg total) by mouth 2 (two) times daily.  . chlorpheniramine (CHLOR-TRIMETON) 4 MG tablet Take 4 mg by mouth every 4 (four) hours as needed for allergies.  Marland Kitchen denosumab (PROLIA) 60 MG/ML SOSY injection See admin instructions.  . diclofenac sodium  (VOLTAREN) 1 % GEL diclofenac 1 % topical gel  AS DIRECTED TWICE A DAY AS NEEDED TRANSDERMAL 14 DAYS  . famotidine (PEPCID) 20 MG tablet Take 20 mg by mouth daily.  Marland Kitchen FLUZONE HIGH-DOSE QUADRIVALENT 0.7 ML SUSY   . furosemide (LASIX) 20 MG tablet Take 20 mg by mouth as needed.   . hydrocortisone 2.5 % cream 1 application to affected area as needed  . losartan (COZAAR) 25 MG tablet Take 50 mg by mouth daily.  Marland Kitchen losartan (COZAAR) 50 MG tablet   . metoprolol (LOPRESSOR) 50 MG tablet Take 25 mg by mouth 2 (two) times daily.  . Multiple Vitamins-Minerals (MULTIPLE VITAMINS/WOMENS PO) Take 1 tablet by mouth daily. W/vitamin D  . MYRBETRIQ 25 MG TB24 tablet Take 25 mg by mouth daily.  . pantoprazole (PROTONIX) 40 MG tablet Take 1 tablet (40 mg total) by mouth daily. Take 30-60 min before first meal of the day  . Pediatric Multivitamins-Iron (FLINTSTONES W/IRON) 18 MG CHEW 2 tablets  . predniSONE (DELTASONE) 5 MG tablet 4 tablet  . Zoster Vaccine Adjuvanted [facility]) injection Shingrix (PF) 50 mcg/0.5 mL intramuscular suspension, kit (Patient not taking: Reported on 11/05/2020)   No current facility-administered medications for this visit. (Other)      REVIEW OF SYSTEMS: ROS    Positive for: Neurological, Genitourinary, Musculoskeletal, Cardiovascular, Eyes   Negative for: Constitutional, Gastrointestinal, Skin, HENT, Endocrine, Respiratory, Psychiatric, Allergic/Imm, Heme/Lymph   Last edited by Roselee Nova D, COT on 11/05/2020  8:45 AM. (History)       ALLERGIES Allergies  Allergen Reactions  . Meloxicam Hives  . Other Other (See Comments)  . Guaifenesin Er Rash    Other reaction(s): rash  . Nitrofuran Derivatives Rash    PAST MEDICAL HISTORY Past Medical History:  Diagnosis Date  . Anemia   . Arthritis   . Glaucoma    OU  . Gout, joint   . Hypertension   . Hypertensive retinopathy    OU  . Macular degeneration    OU  . NSTEMI (non-ST elevated myocardial infarction)  ([place]) 08/2015  . Stroke [facility])    Past Surgical History:  Procedure Laterality Date  . ABDOMINAL HYSTERECTOMY    . BREAST SURGERY     CYST  . CARDIAC CATHETERIZATION N/A 08/18/2015   Procedure: Left Heart Cath and Coronary Angiography;  Surgeon: Troy Sine, MD;  Location: [facility];  Service: Cardiovascular;  Laterality: N/A;  . CARDIAC CATHETERIZATION  08/18/2015   Procedure: Coronary Stent Intervention;  Surgeon: Troy Sine, MD;  Location: [facility];  Service: Cardiovascular;;  . CATARACT EXTRACTION Bilateral   . DILATION AND CURETTAGE OF UTERUS    . EYE SURGERY Bilateral   . HERNIA REPAIR    . IRIDOTOMY / IRIDECTOMY Right   .  JOINT REPLACEMENT  left knee  . REFRACTIVE SURGERY Right 2017    FAMILY HISTORY Family History  Problem Relation Age of Onset  . Heart disease Mother   . Stroke Mother     SOCIAL HISTORY Social History   Tobacco Use  . Smoking status: Never Smoker  . Smokeless tobacco: Never Used  Substance Use Topics  . Alcohol use: No    Alcohol/week: 0.0 standard drinks  . Drug use: No         OPHTHALMIC EXAM:  Base Eye Exam    Visual Acuity (Snellen - Linear)      Right Left   Dist cc HM 20/25 -2   Dist ph cc NI NI   Correction: Glasses       Tonometry (Tonopen, 9:01 AM)      Right Left   Pressure 09 11       Pupils      Dark Light Shape React APD   Right 3.5 3.5 Round Minimal +1   Left 3 2 Round Slow None       Visual Fields (Counting fingers)      Left Right    Full    Restrictions  Total superior temporal, inferior temporal, superior nasal, inferior nasal deficiencies       Extraocular Movement      Right Left    Full Full  RET       Neuro/Psych    Oriented x3: Yes   Mood/Affect: Normal       Dilation    Both eyes: 1.0% Mydriacyl, 2.5% Phenylephrine @ 9:01 AM        Slit Lamp and Fundus Exam    Slit Lamp Exam      Right Left   Lids/Lashes Dermatochalasis - upper lid, mild Meibomian gland  dysfunction Dermatochalasis - upper lid, mild Meibomian gland dysfunction   Conjunctiva/Sclera White and quiet Trace Injection   Cornea Arcus, tear film debris, 2+ PEE, 1+KP, 1+ Descemet's folds Arcus, 2-3+ inferior Punctate epithelial erosions, Well healed cataract wounds   Anterior Chamber Deep and quiet, no cell or flare Deep and quiet   Iris Round and poorly dilated Round and moderately dilated to 54m   Lens PC IOL in good position with open PC PC IOL in good position with open PC   Vitreous Vitreous syneresis, blood stained vitreous condensations Vitreous syneresis, Posterior vitreous detachment       Fundus Exam      Right Left   Disc white 3+pallor, +cupping, thin inf rim and temp PPA 1+ Pallor, Sharp rim, +cupping, temporal Peripapillary atrophy   C/D Ratio 0.8 0.6   Macula Hazy view from vitreous opacities -- Blunted foveal reflex, inferior edema and IRH, central pigment clumping  Flat, Blunted foveal reflex, RPE mottling and clumping, No heme or edema   Vessels Vascular attenuation, Tortuous Vascular attenuation, mild Tortuousity   Periphery Attached, inferior IRH/DBH improving Attached, No heme         Refraction    Wearing Rx      Sphere Cylinder Axis   Right -1.50 +2.25 173   Left -0.75 +2.25 166          IMAGING AND PROCEDURES  Imaging and Procedures for _0 @  OCT, Retina - OU - Both Eyes       Right Eye Quality was poor. Central Foveal Thickness: 580. Progression has been stable. Findings include subretinal hyper-reflective material, intraretinal fluid, intraretinal hyper-reflective material, epiretinal membrane, no SRF, outer retinal atrophy,  abnormal foveal contour (Poor image quality -- persistent IRF - still massive).   Left Eye Quality was borderline. Central Foveal Thickness: 255. Progression has been stable. Findings include normal foveal contour, no IRF, no SRF, retinal drusen .   Notes *Images captured and stored on drive  Diagnosis /  Impression:  OD: Poor image quality -- persistent IRF - still massive OS: NFP, no IRF/SRF, +drusen  Clinical management:  See below  Abbreviations: NFP - Normal foveal profile. CME - cystoid macular edema. PED - pigment epithelial detachment. IRF - intraretinal fluid. SRF - subretinal fluid. EZ - ellipsoid zone. ERM - epiretinal membrane. ORA - outer retinal atrophy. ORT - outer retinal tubulation. SRHM - subretinal hyper-reflective material                 ASSESSMENT/PLAN:    ICD-10-CM   1. Branch retinal vein occlusion of right eye with macular edema  H34.8310   2. Retinal edema  H35.81 OCT, Retina - OU - Both Eyes  3. Exudative age-related macular degeneration of right eye with active choroidal neovascularization ([place])  H35.3211   4. Intermediate stage nonexudative age-related macular degeneration of left eye  H35.3122   5. Essential hypertension  I10   6. Hypertensive retinopathy of both eyes  H35.033   7. Primary open angle glaucoma (POAG) of both eyes, severe stage  H40.1133   8. Pseudophakia of both eyes  Z96.1     1,2. Inferior HRVO w/ CME, OD  - pt lost to f/u from 10/16/2019 to 07/14/2020 (9 mos)  - presenting BCVA CF 2' (09.14.20)  - pt subjectively reports decline in vision OD since Feb 2020  - presenting OCT shows severe CME/IRF temporal macula, +SRF overlying low PED (?exudative ARMD component)  - S/p IVA OD #1 09.14.20, #2 (10.20.20), #3 (12.02.20), #4 (01.06.21), #5 (10.05.21), #6 (11.03.21), #7 (12.01.21), #8 (12.29.22)  - OCT -- Poor image quality -- persistent CME inferior macula  - discussed findings, poor prognosis and treatment options             - Dr. Katy Fitch and patient discussed treatment may not be necessary at this time OD.  Agree, no treatment OD today.    - f/u 6 weeks, DFE, OCT, possible injection  3. Exudative age related macular degeneration, OD    - initial OCT showed +IRF/SRF -- SRF overlying low lying PED  - exam shows +CNVM with surrounding  heme -- improved today  - suspect exudative ARMD component complicating HRVO w/ CME  - S/p IVA OD #1 (09.14.20), #2 (10.20.20), #3 (12.02.20), #4 (01.06.21), #5 (10.05.21), #6 (11.03.21), #7 (12.01.21), #8 (12.29.22)  - f/u in 6 weeks  4. Age related macular degeneration, non-exudative, OS  - The incidence, anatomy, and pathology of dry AMD, risk of progression, and the AREDS and AREDS 2 study including smoking risks discussed with patient.  - recommend [place] grid monitoring   5,6. Hypertensive retinopathy OU  - discussed importance of tight BP control  - monitor  7. POAG OU -- severe stage  - under the expert management of Dr. Midge Aver  - s/p SLT OD 3.23.2017  - s/p TDC OD w/ [name], 3.30.21  - IOP good today at 7,8  - pt on latnoprost qhs OD, dorzolamide/timolol bid OU, brimonidine bid OU  8. Pseudophakia OU  - s/p CE/IOL OU  - beautiful surgeries, doing well  - monitor   Ophthalmic Meds Ordered this visit:  No orders of the defined types were  placed in this encounter.      Return for Return in 6 weeks for DFE, OCT .  There are no Patient Instructions on file for this visit.   Explained the diagnoses, plan, and follow up with the patient and they expressed understanding.  Patient expressed understanding of the importance of proper follow up care.   This document serves as a record of services personally performed by Gardiner Sleeper, MD, PhD. It was created on their behalf by Leonie Douglas, an ophthalmic technician. The creation of this record is the provider's dictation and/or activities during the visit.    Electronically signed by: Leonie Douglas COA, 11/09/20  4:29 PM  Gardiner Sleeper, M.D., Ph.D. Diseases & Surgery of the Retina and Vitreous [facility]  I have reviewed the above documentation for accuracy and completeness, and I agree with the above. Gardiner Sleeper, M.D., Ph.D. 11/09/20 4:29 PM   Abbreviations: M myopia  (nearsighted); A astigmatism; H hyperopia (farsighted); P presbyopia; Mrx spectacle prescription;  CTL contact lenses; OD right eye; OS left eye; OU both eyes  XT exotropia; ET esotropia; PEK punctate epithelial keratitis; PEE punctate epithelial erosions; DES dry eye syndrome; MGD meibomian gland dysfunction; ATs artificial tears; PFAT's preservative free artificial tears; [place] nuclear sclerotic cataract; PSC posterior subcapsular cataract; ERM epi-retinal membrane; PVD posterior vitreous detachment; RD retinal detachment; DM diabetes mellitus; DR diabetic retinopathy; NPDR non-proliferative diabetic retinopathy; PDR proliferative diabetic retinopathy; CSME clinically significant macular edema; DME diabetic macular edema; dbh dot blot hemorrhages; CWS cotton wool spot; POAG primary open angle glaucoma; C/D cup-to-disc ratio; HVF humphrey visual field; GVF goldmann visual field; OCT optical coherence tomography; IOP intraocular pressure; BRVO Branch retinal vein occlusion; CRVO central retinal vein occlusion; CRAO central retinal artery occlusion; BRAO branch retinal artery occlusion; RT retinal tear; SB scleral buckle; PPV pars plana vitrectomy; VH Vitreous hemorrhage; PRP panretinal laser photocoagulation; IVK intravitreal kenalog; VMT vitreomacular traction; MH Macular hole;  NVD neovascularization of the disc; NVE neovascularization elsewhere; AREDS age related eye disease study; ARMD age related macular degeneration; POAG primary open angle glaucoma; EBMD epithelial/anterior basement membrane dystrophy; ACIOL anterior chamber intraocular lens; IOL intraocular lens; PCIOL posterior chamber intraocular lens; Phaco/IOL phacoemulsification with intraocular lens placement; [place] photorefractive keratectomy; LASIK laser assisted in situ keratomileusis; HTN hypertension; DM diabetes mellitus; COPD chronic obstructive pulmonary disease

## 2020-11-06 DIAGNOSIS — M25511 Pain in right shoulder: Secondary | ICD-10-CM | POA: Diagnosis not present

## 2020-11-06 DIAGNOSIS — M25522 Pain in left elbow: Secondary | ICD-10-CM | POA: Diagnosis not present

## 2020-11-06 DIAGNOSIS — M25521 Pain in right elbow: Secondary | ICD-10-CM | POA: Diagnosis not present

## 2020-11-06 DIAGNOSIS — M25512 Pain in left shoulder: Secondary | ICD-10-CM | POA: Diagnosis not present

## 2020-11-08 ENCOUNTER — Encounter (INDEPENDENT_AMBULATORY_CARE_PROVIDER_SITE_OTHER): Payer: Self-pay | Admitting: Ophthalmology

## 2020-11-11 DIAGNOSIS — E1122 Type 2 diabetes mellitus with diabetic chronic kidney disease: Secondary | ICD-10-CM | POA: Diagnosis not present

## 2020-11-11 DIAGNOSIS — I2510 Atherosclerotic heart disease of native coronary artery without angina pectoris: Secondary | ICD-10-CM | POA: Diagnosis not present

## 2020-11-11 DIAGNOSIS — I5032 Chronic diastolic (congestive) heart failure: Secondary | ICD-10-CM | POA: Diagnosis not present

## 2020-11-11 DIAGNOSIS — N390 Urinary tract infection, site not specified: Secondary | ICD-10-CM | POA: Diagnosis not present

## 2020-11-11 DIAGNOSIS — M1990 Unspecified osteoarthritis, unspecified site: Secondary | ICD-10-CM | POA: Diagnosis not present

## 2020-11-11 DIAGNOSIS — I272 Pulmonary hypertension, unspecified: Secondary | ICD-10-CM | POA: Diagnosis not present

## 2020-11-11 DIAGNOSIS — R296 Repeated falls: Secondary | ICD-10-CM | POA: Diagnosis not present

## 2020-11-11 DIAGNOSIS — Z8673 Personal history of transient ischemic attack (TIA), and cerebral infarction without residual deficits: Secondary | ICD-10-CM | POA: Diagnosis not present

## 2020-11-11 DIAGNOSIS — I130 Hypertensive heart and chronic kidney disease with heart failure and stage 1 through stage 4 chronic kidney disease, or unspecified chronic kidney disease: Secondary | ICD-10-CM | POA: Diagnosis not present

## 2020-11-11 DIAGNOSIS — N189 Chronic kidney disease, unspecified: Secondary | ICD-10-CM | POA: Diagnosis not present

## 2020-11-12 DIAGNOSIS — R350 Frequency of micturition: Secondary | ICD-10-CM | POA: Diagnosis not present

## 2020-11-17 DIAGNOSIS — M1990 Unspecified osteoarthritis, unspecified site: Secondary | ICD-10-CM | POA: Diagnosis not present

## 2020-11-17 DIAGNOSIS — E1122 Type 2 diabetes mellitus with diabetic chronic kidney disease: Secondary | ICD-10-CM | POA: Diagnosis not present

## 2020-11-17 DIAGNOSIS — N390 Urinary tract infection, site not specified: Secondary | ICD-10-CM | POA: Diagnosis not present

## 2020-11-17 DIAGNOSIS — I272 Pulmonary hypertension, unspecified: Secondary | ICD-10-CM | POA: Diagnosis not present

## 2020-11-17 DIAGNOSIS — Z8673 Personal history of transient ischemic attack (TIA), and cerebral infarction without residual deficits: Secondary | ICD-10-CM | POA: Diagnosis not present

## 2020-11-17 DIAGNOSIS — I5032 Chronic diastolic (congestive) heart failure: Secondary | ICD-10-CM | POA: Diagnosis not present

## 2020-11-17 DIAGNOSIS — N189 Chronic kidney disease, unspecified: Secondary | ICD-10-CM | POA: Diagnosis not present

## 2020-11-17 DIAGNOSIS — I130 Hypertensive heart and chronic kidney disease with heart failure and stage 1 through stage 4 chronic kidney disease, or unspecified chronic kidney disease: Secondary | ICD-10-CM | POA: Diagnosis not present

## 2020-11-17 DIAGNOSIS — I2510 Atherosclerotic heart disease of native coronary artery without angina pectoris: Secondary | ICD-10-CM | POA: Diagnosis not present

## 2020-11-17 DIAGNOSIS — R296 Repeated falls: Secondary | ICD-10-CM | POA: Diagnosis not present

## 2020-11-18 ENCOUNTER — Ambulatory Visit: Payer: Medicare Other | Admitting: Podiatry

## 2020-11-18 ENCOUNTER — Other Ambulatory Visit: Payer: Self-pay

## 2020-11-18 ENCOUNTER — Encounter: Payer: Self-pay | Admitting: Podiatry

## 2020-11-18 DIAGNOSIS — M1990 Unspecified osteoarthritis, unspecified site: Secondary | ICD-10-CM | POA: Diagnosis not present

## 2020-11-18 DIAGNOSIS — I130 Hypertensive heart and chronic kidney disease with heart failure and stage 1 through stage 4 chronic kidney disease, or unspecified chronic kidney disease: Secondary | ICD-10-CM | POA: Diagnosis not present

## 2020-11-18 DIAGNOSIS — H34811 Central retinal vein occlusion, right eye, with macular edema: Secondary | ICD-10-CM | POA: Diagnosis not present

## 2020-11-18 DIAGNOSIS — Z961 Presence of intraocular lens: Secondary | ICD-10-CM | POA: Diagnosis not present

## 2020-11-18 DIAGNOSIS — E1121 Type 2 diabetes mellitus with diabetic nephropathy: Secondary | ICD-10-CM | POA: Diagnosis not present

## 2020-11-18 DIAGNOSIS — Z8673 Personal history of transient ischemic attack (TIA), and cerebral infarction without residual deficits: Secondary | ICD-10-CM | POA: Diagnosis not present

## 2020-11-18 DIAGNOSIS — H353131 Nonexudative age-related macular degeneration, bilateral, early dry stage: Secondary | ICD-10-CM | POA: Diagnosis not present

## 2020-11-18 DIAGNOSIS — E08621 Diabetes mellitus due to underlying condition with foot ulcer: Secondary | ICD-10-CM

## 2020-11-18 DIAGNOSIS — H40012 Open angle with borderline findings, low risk, left eye: Secondary | ICD-10-CM | POA: Diagnosis not present

## 2020-11-18 DIAGNOSIS — L97511 Non-pressure chronic ulcer of other part of right foot limited to breakdown of skin: Secondary | ICD-10-CM

## 2020-11-18 DIAGNOSIS — I2510 Atherosclerotic heart disease of native coronary artery without angina pectoris: Secondary | ICD-10-CM | POA: Diagnosis not present

## 2020-11-18 DIAGNOSIS — R296 Repeated falls: Secondary | ICD-10-CM | POA: Diagnosis not present

## 2020-11-18 DIAGNOSIS — I5032 Chronic diastolic (congestive) heart failure: Secondary | ICD-10-CM | POA: Diagnosis not present

## 2020-11-18 DIAGNOSIS — H04123 Dry eye syndrome of bilateral lacrimal glands: Secondary | ICD-10-CM | POA: Diagnosis not present

## 2020-11-18 DIAGNOSIS — E1122 Type 2 diabetes mellitus with diabetic chronic kidney disease: Secondary | ICD-10-CM | POA: Diagnosis not present

## 2020-11-18 DIAGNOSIS — I272 Pulmonary hypertension, unspecified: Secondary | ICD-10-CM | POA: Diagnosis not present

## 2020-11-18 DIAGNOSIS — H20011 Primary iridocyclitis, right eye: Secondary | ICD-10-CM | POA: Diagnosis not present

## 2020-11-18 DIAGNOSIS — N390 Urinary tract infection, site not specified: Secondary | ICD-10-CM | POA: Diagnosis not present

## 2020-11-18 DIAGNOSIS — H401113 Primary open-angle glaucoma, right eye, severe stage: Secondary | ICD-10-CM | POA: Diagnosis not present

## 2020-11-18 DIAGNOSIS — N189 Chronic kidney disease, unspecified: Secondary | ICD-10-CM | POA: Diagnosis not present

## 2020-11-19 DIAGNOSIS — N189 Chronic kidney disease, unspecified: Secondary | ICD-10-CM | POA: Diagnosis not present

## 2020-11-19 DIAGNOSIS — R296 Repeated falls: Secondary | ICD-10-CM | POA: Diagnosis not present

## 2020-11-19 DIAGNOSIS — Z8673 Personal history of transient ischemic attack (TIA), and cerebral infarction without residual deficits: Secondary | ICD-10-CM | POA: Diagnosis not present

## 2020-11-19 DIAGNOSIS — M1990 Unspecified osteoarthritis, unspecified site: Secondary | ICD-10-CM | POA: Diagnosis not present

## 2020-11-19 DIAGNOSIS — I272 Pulmonary hypertension, unspecified: Secondary | ICD-10-CM | POA: Diagnosis not present

## 2020-11-19 DIAGNOSIS — I130 Hypertensive heart and chronic kidney disease with heart failure and stage 1 through stage 4 chronic kidney disease, or unspecified chronic kidney disease: Secondary | ICD-10-CM | POA: Diagnosis not present

## 2020-11-19 DIAGNOSIS — R350 Frequency of micturition: Secondary | ICD-10-CM | POA: Diagnosis not present

## 2020-11-19 DIAGNOSIS — E1122 Type 2 diabetes mellitus with diabetic chronic kidney disease: Secondary | ICD-10-CM | POA: Diagnosis not present

## 2020-11-19 DIAGNOSIS — N390 Urinary tract infection, site not specified: Secondary | ICD-10-CM | POA: Diagnosis not present

## 2020-11-19 DIAGNOSIS — I5032 Chronic diastolic (congestive) heart failure: Secondary | ICD-10-CM | POA: Diagnosis not present

## 2020-11-19 DIAGNOSIS — I2510 Atherosclerotic heart disease of native coronary artery without angina pectoris: Secondary | ICD-10-CM | POA: Diagnosis not present

## 2020-11-22 NOTE — Progress Notes (Signed)
Subjective:  Patient ID: [name], female    DOB: May 01, 1923,  MRN: [id number]  87 y.o. female is seen for follow up diabetic foot ulcer ball of right foot.  Her granddaughter, Sharyn Lull,  is present during today's visit and accompanies her on every visit.  She states her right foot is sore and she is also having of her right foot bunion pain as well.   Wound location: ball of right foot. There is tenderness on today's visit. No active drainage, no odor.  Patient denies fever, chills, night sweats, nausea, or vomiting.  She will be taking cephalexin indefinitely  for chronic UTI.  Past Medical History:  Diagnosis Date  . Anemia   . Arthritis   . Glaucoma    OU  . Gout, joint   . Hypertension   . Hypertensive retinopathy    OU  . Macular degeneration    OU  . NSTEMI (non-ST elevated myocardial infarction) ([place]) 08/2015  . Stroke [facility])      Past Surgical History:  Procedure Laterality Date  . ABDOMINAL HYSTERECTOMY    . BREAST SURGERY     CYST  . CARDIAC CATHETERIZATION N/A 08/18/2015   Procedure: Left Heart Cath and Coronary Angiography;  Surgeon: Troy Sine, MD;  Location: [place] CV LAB;  Service: Cardiovascular;  Laterality: N/A;  . CARDIAC CATHETERIZATION  08/18/2015   Procedure: Coronary Stent Intervention;  Surgeon: Troy Sine, MD;  Location: [place] CV LAB;  Service: Cardiovascular;;  . CATARACT EXTRACTION Bilateral   . DILATION AND CURETTAGE OF UTERUS    . EYE SURGERY Bilateral   . HERNIA REPAIR    . IRIDOTOMY / IRIDECTOMY Right   . JOINT REPLACEMENT  left knee  . REFRACTIVE SURGERY Right 2017     Current Outpatient Medications on File Prior to Visit  Medication Sig Dispense Refill  . ciprofloxacin (CIPRO) 250 MG tablet 1 tablet    . Acetaminophen 325 MG CAPS Take 325 mg by mouth 3 (three) times daily as needed.    Marland Kitchen aspirin 81 MG chewable tablet Chew 81 mg by mouth daily.    Marland Kitchen atorvastatin (LIPITOR) 80 MG tablet Take 1 tablet (80 mg total)  by mouth daily at 6 PM. 60 tablet 0  . atropine 1 % ophthalmic solution Place one drop into the right eye 2 (two) times daily for 14 days. Do not take tonight.  [name] will evaluate your post-operative status tomorrow and may make changes to the duration and dose of this medication.    . bimatoprost (LUMIGAN) 0.01 % SOLN Place 1 drop into the right eye at bedtime.     . brimonidine (ALPHAGAN) 0.2 % ophthalmic solution brimonidine 0.2 % eye drops  INSTILL 1 DROP INTO BOTH EYES TWICE A DAY    . Calcium Carbonate-Vitamin D (CALTRATE 600+D PO) Take 1 tablet by mouth 2 (two) times daily.    . cefUROXime (CEFTIN) 500 MG tablet 1 tablet    . cephALEXin (KEFLEX) 250 MG capsule Take 250 mg by mouth at bedtime.    . chlorpheniramine (CHLOR-TRIMETON) 4 MG tablet Take 4 mg by mouth every 4 (four) hours as needed for allergies.    . ciprofloxacin (CIPRO) 500 MG tablet SMARTSIG:0.5 Tablet(s) By Mouth Every 12 Hours    . ciprofloxacin (CIPRO) 500 MG tablet ciprofloxacin 500 mg tablet    . denosumab (PROLIA) 60 MG/ML SOSY injection See admin instructions.    . diclofenac sodium (VOLTAREN) 1 % GEL diclofenac 1 %  topical gel  AS DIRECTED TWICE A DAY AS NEEDED TRANSDERMAL 14 DAYS    . dorzolamide-timolol (COSOPT) 22.3-6.8 MG/ML ophthalmic solution Place 1 drop into both eyes 2 (two) times daily.     . famotidine (PEPCID) 20 MG tablet Take 20 mg by mouth daily.    Marland Kitchen FLUZONE HIGH-DOSE QUADRIVALENT 0.7 ML SUSY     . furosemide (LASIX) 20 MG tablet Take 20 mg by mouth as needed.     . hydrocortisone 2.5 % cream 1 application to affected area as needed    . ketorolac (ACULAR) 0.5 % ophthalmic solution Place one drop into the right eye 4 (four) times daily for 28 days. (Patient not taking: Reported on 11/05/2020)    . latanoprost (XALATAN) 0.005 % ophthalmic solution latanoprost 0.005 % eye drops  INSTILL 1 DROP INTO RIGHT EYE AT BEDTIME    . losartan (COZAAR) 25 MG tablet Take 50 mg by mouth daily.    Marland Kitchen losartan  (COZAAR) 50 MG tablet     . metoprolol (LOPRESSOR) 50 MG tablet Take 25 mg by mouth 2 (two) times daily.    Marland Kitchen moxifloxacin (VIGAMOX) 0.5 % ophthalmic solution Apply 1 drop to eye 3 (three) times daily.    . Multiple Vitamins-Minerals (MULTIPLE VITAMINS/WOMENS PO) Take 1 tablet by mouth daily. W/vitamin D    . MYRBETRIQ 25 MG TB24 tablet Take 25 mg by mouth daily.    Marland Kitchen neomycin-polymyxin-dexameth (MAXITROL) 0.1 % OINT Do not use tonight!   Apply a strip of ointment the size of a grain of rice to RIGHT eye at bedtime. This must be the last eye medicine of the day.   [name] will evaluate your post-operative status tomorrow and may make changes to the duration and dose of this medication.    . Netarsudil Dimesylate 0.02 % SOLN Rhopressa 0.02 % eye drops  INSTILL 1 DROP INTO AFFECTED EYE(S) BY OPHTHALMIC ROUTE ONCE DAILY INTHE EVENING    . ofloxacin (OCUFLOX) 0.3 % ophthalmic solution 1 drop into affected eye (Patient not taking: Reported on 11/05/2020)    . pantoprazole (PROTONIX) 40 MG tablet Take 1 tablet (40 mg total) by mouth daily. Take 30-60 min before first meal of the day 30 tablet 2  . Pediatric Multivitamins-Iron (FLINTSTONES W/IRON) 18 MG CHEW 2 tablets    . Polyethyl Glycol-Propyl Glycol (SYSTANE OP) Apply 1 drop to eye 2 (two) times daily.    . prednisoLONE acetate (PRED FORTE) 1 % ophthalmic suspension 1 gtt  4 times a day for one week, then 3 times a day for one week, then 2 times a day for one week, then 1 time a day for one week, then stop right eye. (Patient not taking: Reported on 11/05/2020)    . prednisoLONE acetate (PRED FORTE) 1 % ophthalmic suspension     . predniSONE (DELTASONE) 5 MG tablet 4 tablet    . Zoster Vaccine Adjuvanted [facility]) injection Shingrix (PF) 50 mcg/0.5 mL intramuscular suspension, kit (Patient not taking: Reported on 11/05/2020)     No current facility-administered medications on file prior to visit.     Allergies  Allergen Reactions  . Meloxicam  Hives  . Other Other (See Comments)  . Guaifenesin Er Rash    Other reaction(s): rash  . Nitrofuran Derivatives Rash     Family History  Problem Relation Age of Onset  . Heart disease Mother   . Stroke Mother      Social History   Occupational History  Comment: retired  Tobacco Use  . Smoking status: Never Smoker  . Smokeless tobacco: Never Used  Substance and Sexual Activity  . Alcohol use: No    Alcohol/week: 0.0 standard drinks  . Drug use: No  . Sexual activity: Never    Birth control/protection: Post-menopausal     Immunization History  Administered Date(s) Administered  . Influenza Whole 06/10/2017  . Influenza, High Dose Seasonal PF 07/25/2014, 07/07/2015, 07/07/2016, 06/22/2017, 07/14/2019  . Influenza, Quadrivalent, Recombinant, Inj, Pf 07/05/2018, 08/03/2020  . Influenza,inj,quad, With Preservative 07/14/2019  . Influenza-Unspecified 07/26/2017, 07/26/2018  . PFIZER(Purple Top)SARS-COV-2 Vaccination 11/16/2019, 12/11/2019, 07/28/2020  . Pneumococcal Conjugate-13 03/26/2015  . Pneumococcal Polysaccharide-23 09/28/2012  . Tdap 09/25/2012  . Zoster 11/01/2018  . Zoster Recombinat (Shingrix) 11/01/2018   Objective:  Physical Exam: There were no vitals filed for this visit.   87 y.o. female pleasant Caucasian female in NAD. AAO x 3.  Vascular Examination: Capillary fill time to digits <3 seconds b/l lower extremities. Palpable pedal pulses b/l LE. Pedal hair absent. Lower extremity skin temperature gradient within normal limits. Nonpitting edema noted b/l LE.Marland Kitchen  Dermatological Examination: Pedal skin with normal turgor, texture and tone bilaterally. No interdigital macerations bilaterally. Toenails 1-5 b/l well maintained with adequate length. No erythema, no edema, no drainage, no flocculence. No erythema, no edema, no drainage, no flocculence.   Wound Location: ball of right foot      There is a moderate amount of devitalized hyperkeratotic tissue  present.   Predebridement Wound Measurement: 0.7 x 0.8 cm with hyperkeratotic roof and subdermal hemorrhage Postdebridement Wound Measurement: 1.5 x 0.8 x 0.2 cm and is partial thickness submet head 3 right foot.   Wound Base:  Epithelial Peri-wound: Calloused Exudate: None: wound tissue dry Blood Loss during debridement: 0 cc's. Description of tissue removed from ulceration today:  Hyperkeratosis and devitalized subcutaneous tissue. Signs of clinical bacterial infection are absent. Material in wound which inhibits healing/promotes adjacent tissue breakdown: Hyperkeratosis  Porokeratotic lesion medial 1st met head right foot with tenderness to palpation. No surrounding erythema, no edema, no drainage, no fluctuance.  Musculoskeletal Examination: Normal muscle strength 5/5 to all lower extremity muscle groups bilaterally. No pain crepitus or joint limitation noted with ROM b/l. Plantarflexed metatarsal(s) right lower extremity. Hallux valgus with bunion deformity noted b/l lower extremities. Hammertoes noted to the 2-5 bilaterally. Noted plantar fat pad atrophy of forefoot area b/l lower extremities.  Neurological Examination: Protective sensation diminished with 10g monofilament b/l. Proprioception intact bilaterally.  Assessment:   1. Diabetic ulcer of right foot associated with diabetes mellitus due to underlying condition, limited to breakdown of skin, unspecified part of foot ([place])   2. Diabetic nephropathy associated with type 2 diabetes mellitus ([place])    Plan:  -Advised patient to wear her offloaded Darco shoe at all times. Do not wear house slipper on right foot. -Patient was evaluated and treated and all questions answered.  -Porokeratotic lesion right 1st metatarsal head: continue daily use of tubefoam bunion shield. -Patient/POA/Family member educated on diagnosis and treatment plan of routine ulcer debridement/wound care.  -Type/amount of devitalized tissue removed:  devitalized hyperkeratotic tissue.  -She is on antibiotics long-term due to chronic UTI.  -Today's postdebridement measurements: 1.5 x 0.8 x 0.2 cm submet head 3 right foot ulcer size post-debridement: Ulcer cleansed with wound cleanser. Iodosorb ointment and band-aid applied. -Wound responded well to today's debridement. -Patient risk factors affecting healing of ulcer: diabetes, foot deformity, neuropathy CKD, fat pad atrophy b/l feet, recurrent ulceration. -Daughter  is well experienced/educated on the chronic ulceration of the right foot. Start daily dressing changes with Iodosorb Gel. -Frequency of debridement needed to achieve healing: every 2-3 weeks to prevent breakdown. Daughter knows to call office for myself or Dr. Jacqualyn Posey if condition worsens. -Call office if condition worsens or Ms. Huffaker experiences any increased redness, drainage, swelling, warmth of right foot, fever, chills, night sweats, nausea, vomiting  Return in about 3 weeks (around 12/09/2020).  Marzetta Board, DPM

## 2020-11-24 DIAGNOSIS — N390 Urinary tract infection, site not specified: Secondary | ICD-10-CM | POA: Diagnosis not present

## 2020-11-24 DIAGNOSIS — R296 Repeated falls: Secondary | ICD-10-CM | POA: Diagnosis not present

## 2020-11-24 DIAGNOSIS — Z8673 Personal history of transient ischemic attack (TIA), and cerebral infarction without residual deficits: Secondary | ICD-10-CM | POA: Diagnosis not present

## 2020-11-24 DIAGNOSIS — I130 Hypertensive heart and chronic kidney disease with heart failure and stage 1 through stage 4 chronic kidney disease, or unspecified chronic kidney disease: Secondary | ICD-10-CM | POA: Diagnosis not present

## 2020-11-24 DIAGNOSIS — I5032 Chronic diastolic (congestive) heart failure: Secondary | ICD-10-CM | POA: Diagnosis not present

## 2020-11-24 DIAGNOSIS — E1122 Type 2 diabetes mellitus with diabetic chronic kidney disease: Secondary | ICD-10-CM | POA: Diagnosis not present

## 2020-11-24 DIAGNOSIS — M1990 Unspecified osteoarthritis, unspecified site: Secondary | ICD-10-CM | POA: Diagnosis not present

## 2020-11-24 DIAGNOSIS — I2510 Atherosclerotic heart disease of native coronary artery without angina pectoris: Secondary | ICD-10-CM | POA: Diagnosis not present

## 2020-11-24 DIAGNOSIS — I272 Pulmonary hypertension, unspecified: Secondary | ICD-10-CM | POA: Diagnosis not present

## 2020-11-24 DIAGNOSIS — N189 Chronic kidney disease, unspecified: Secondary | ICD-10-CM | POA: Diagnosis not present

## 2020-11-26 DIAGNOSIS — I2510 Atherosclerotic heart disease of native coronary artery without angina pectoris: Secondary | ICD-10-CM | POA: Diagnosis not present

## 2020-11-26 DIAGNOSIS — N390 Urinary tract infection, site not specified: Secondary | ICD-10-CM | POA: Diagnosis not present

## 2020-11-26 DIAGNOSIS — E1122 Type 2 diabetes mellitus with diabetic chronic kidney disease: Secondary | ICD-10-CM | POA: Diagnosis not present

## 2020-11-26 DIAGNOSIS — I5032 Chronic diastolic (congestive) heart failure: Secondary | ICD-10-CM | POA: Diagnosis not present

## 2020-11-26 DIAGNOSIS — N189 Chronic kidney disease, unspecified: Secondary | ICD-10-CM | POA: Diagnosis not present

## 2020-11-26 DIAGNOSIS — I272 Pulmonary hypertension, unspecified: Secondary | ICD-10-CM | POA: Diagnosis not present

## 2020-11-26 DIAGNOSIS — R296 Repeated falls: Secondary | ICD-10-CM | POA: Diagnosis not present

## 2020-11-26 DIAGNOSIS — Z8673 Personal history of transient ischemic attack (TIA), and cerebral infarction without residual deficits: Secondary | ICD-10-CM | POA: Diagnosis not present

## 2020-11-26 DIAGNOSIS — I130 Hypertensive heart and chronic kidney disease with heart failure and stage 1 through stage 4 chronic kidney disease, or unspecified chronic kidney disease: Secondary | ICD-10-CM | POA: Diagnosis not present

## 2020-11-26 DIAGNOSIS — R350 Frequency of micturition: Secondary | ICD-10-CM | POA: Diagnosis not present

## 2020-11-26 DIAGNOSIS — M1990 Unspecified osteoarthritis, unspecified site: Secondary | ICD-10-CM | POA: Diagnosis not present

## 2020-11-27 DIAGNOSIS — H20011 Primary iridocyclitis, right eye: Secondary | ICD-10-CM | POA: Diagnosis not present

## 2020-11-27 DIAGNOSIS — H401113 Primary open-angle glaucoma, right eye, severe stage: Secondary | ICD-10-CM | POA: Diagnosis not present

## 2020-11-27 DIAGNOSIS — H34811 Central retinal vein occlusion, right eye, with macular edema: Secondary | ICD-10-CM | POA: Diagnosis not present

## 2020-11-27 DIAGNOSIS — M7062 Trochanteric bursitis, left hip: Secondary | ICD-10-CM | POA: Diagnosis not present

## 2020-11-27 DIAGNOSIS — H04123 Dry eye syndrome of bilateral lacrimal glands: Secondary | ICD-10-CM | POA: Diagnosis not present

## 2020-11-27 DIAGNOSIS — H353131 Nonexudative age-related macular degeneration, bilateral, early dry stage: Secondary | ICD-10-CM | POA: Diagnosis not present

## 2020-11-27 DIAGNOSIS — H40012 Open angle with borderline findings, low risk, left eye: Secondary | ICD-10-CM | POA: Diagnosis not present

## 2020-11-27 DIAGNOSIS — Z961 Presence of intraocular lens: Secondary | ICD-10-CM | POA: Diagnosis not present

## 2020-11-27 DIAGNOSIS — M1711 Unilateral primary osteoarthritis, right knee: Secondary | ICD-10-CM | POA: Diagnosis not present

## 2020-11-28 DIAGNOSIS — Z8673 Personal history of transient ischemic attack (TIA), and cerebral infarction without residual deficits: Secondary | ICD-10-CM | POA: Diagnosis not present

## 2020-11-28 DIAGNOSIS — I272 Pulmonary hypertension, unspecified: Secondary | ICD-10-CM | POA: Diagnosis not present

## 2020-11-28 DIAGNOSIS — M1990 Unspecified osteoarthritis, unspecified site: Secondary | ICD-10-CM | POA: Diagnosis not present

## 2020-11-28 DIAGNOSIS — R296 Repeated falls: Secondary | ICD-10-CM | POA: Diagnosis not present

## 2020-11-28 DIAGNOSIS — I2510 Atherosclerotic heart disease of native coronary artery without angina pectoris: Secondary | ICD-10-CM | POA: Diagnosis not present

## 2020-11-28 DIAGNOSIS — E1122 Type 2 diabetes mellitus with diabetic chronic kidney disease: Secondary | ICD-10-CM | POA: Diagnosis not present

## 2020-11-28 DIAGNOSIS — I5032 Chronic diastolic (congestive) heart failure: Secondary | ICD-10-CM | POA: Diagnosis not present

## 2020-11-28 DIAGNOSIS — N390 Urinary tract infection, site not specified: Secondary | ICD-10-CM | POA: Diagnosis not present

## 2020-11-28 DIAGNOSIS — N189 Chronic kidney disease, unspecified: Secondary | ICD-10-CM | POA: Diagnosis not present

## 2020-11-28 DIAGNOSIS — I130 Hypertensive heart and chronic kidney disease with heart failure and stage 1 through stage 4 chronic kidney disease, or unspecified chronic kidney disease: Secondary | ICD-10-CM | POA: Diagnosis not present

## 2020-12-01 DIAGNOSIS — I272 Pulmonary hypertension, unspecified: Secondary | ICD-10-CM | POA: Diagnosis not present

## 2020-12-01 DIAGNOSIS — E1122 Type 2 diabetes mellitus with diabetic chronic kidney disease: Secondary | ICD-10-CM | POA: Diagnosis not present

## 2020-12-01 DIAGNOSIS — N189 Chronic kidney disease, unspecified: Secondary | ICD-10-CM | POA: Diagnosis not present

## 2020-12-01 DIAGNOSIS — N390 Urinary tract infection, site not specified: Secondary | ICD-10-CM | POA: Diagnosis not present

## 2020-12-01 DIAGNOSIS — M1990 Unspecified osteoarthritis, unspecified site: Secondary | ICD-10-CM | POA: Diagnosis not present

## 2020-12-01 DIAGNOSIS — I130 Hypertensive heart and chronic kidney disease with heart failure and stage 1 through stage 4 chronic kidney disease, or unspecified chronic kidney disease: Secondary | ICD-10-CM | POA: Diagnosis not present

## 2020-12-01 DIAGNOSIS — R296 Repeated falls: Secondary | ICD-10-CM | POA: Diagnosis not present

## 2020-12-01 DIAGNOSIS — Z8673 Personal history of transient ischemic attack (TIA), and cerebral infarction without residual deficits: Secondary | ICD-10-CM | POA: Diagnosis not present

## 2020-12-01 DIAGNOSIS — I5032 Chronic diastolic (congestive) heart failure: Secondary | ICD-10-CM | POA: Diagnosis not present

## 2020-12-01 DIAGNOSIS — I2510 Atherosclerotic heart disease of native coronary artery without angina pectoris: Secondary | ICD-10-CM | POA: Diagnosis not present

## 2020-12-03 DIAGNOSIS — I5032 Chronic diastolic (congestive) heart failure: Secondary | ICD-10-CM | POA: Diagnosis not present

## 2020-12-03 DIAGNOSIS — M1990 Unspecified osteoarthritis, unspecified site: Secondary | ICD-10-CM | POA: Diagnosis not present

## 2020-12-03 DIAGNOSIS — R350 Frequency of micturition: Secondary | ICD-10-CM | POA: Diagnosis not present

## 2020-12-03 DIAGNOSIS — N390 Urinary tract infection, site not specified: Secondary | ICD-10-CM | POA: Diagnosis not present

## 2020-12-03 DIAGNOSIS — R296 Repeated falls: Secondary | ICD-10-CM | POA: Diagnosis not present

## 2020-12-03 DIAGNOSIS — E1122 Type 2 diabetes mellitus with diabetic chronic kidney disease: Secondary | ICD-10-CM | POA: Diagnosis not present

## 2020-12-03 DIAGNOSIS — I272 Pulmonary hypertension, unspecified: Secondary | ICD-10-CM | POA: Diagnosis not present

## 2020-12-03 DIAGNOSIS — N189 Chronic kidney disease, unspecified: Secondary | ICD-10-CM | POA: Diagnosis not present

## 2020-12-03 DIAGNOSIS — Z8673 Personal history of transient ischemic attack (TIA), and cerebral infarction without residual deficits: Secondary | ICD-10-CM | POA: Diagnosis not present

## 2020-12-03 DIAGNOSIS — I2510 Atherosclerotic heart disease of native coronary artery without angina pectoris: Secondary | ICD-10-CM | POA: Diagnosis not present

## 2020-12-03 DIAGNOSIS — I130 Hypertensive heart and chronic kidney disease with heart failure and stage 1 through stage 4 chronic kidney disease, or unspecified chronic kidney disease: Secondary | ICD-10-CM | POA: Diagnosis not present

## 2020-12-05 DIAGNOSIS — N189 Chronic kidney disease, unspecified: Secondary | ICD-10-CM | POA: Diagnosis not present

## 2020-12-05 DIAGNOSIS — I5032 Chronic diastolic (congestive) heart failure: Secondary | ICD-10-CM | POA: Diagnosis not present

## 2020-12-05 DIAGNOSIS — R296 Repeated falls: Secondary | ICD-10-CM | POA: Diagnosis not present

## 2020-12-05 DIAGNOSIS — E1122 Type 2 diabetes mellitus with diabetic chronic kidney disease: Secondary | ICD-10-CM | POA: Diagnosis not present

## 2020-12-05 DIAGNOSIS — Z8673 Personal history of transient ischemic attack (TIA), and cerebral infarction without residual deficits: Secondary | ICD-10-CM | POA: Diagnosis not present

## 2020-12-05 DIAGNOSIS — M1990 Unspecified osteoarthritis, unspecified site: Secondary | ICD-10-CM | POA: Diagnosis not present

## 2020-12-05 DIAGNOSIS — I272 Pulmonary hypertension, unspecified: Secondary | ICD-10-CM | POA: Diagnosis not present

## 2020-12-05 DIAGNOSIS — N390 Urinary tract infection, site not specified: Secondary | ICD-10-CM | POA: Diagnosis not present

## 2020-12-05 DIAGNOSIS — I2510 Atherosclerotic heart disease of native coronary artery without angina pectoris: Secondary | ICD-10-CM | POA: Diagnosis not present

## 2020-12-05 DIAGNOSIS — I130 Hypertensive heart and chronic kidney disease with heart failure and stage 1 through stage 4 chronic kidney disease, or unspecified chronic kidney disease: Secondary | ICD-10-CM | POA: Diagnosis not present

## 2020-12-07 NOTE — Progress Notes (Signed)
HPI: Follow-up coronary artery disease. Carotid Dopplers October 2016 showed no significant stenosis. Admitted November 2016 and ruled in for a non-ST elevation myocardial infarction. Cardiac catheterization revealed a 95% distal RCA. Patient had a drug-eluting stent. Procedure complicated by CVA. Echocardiogram November 2016 showed normal LV function, grade 1 diastolic dysfunction, mild mitral regurgitation, moderate tricuspid regurgitation and moderate to severe pulmonary hypertension. Patient seen in the emergency room January 2020 with left lower extremity edema.  Lower extremity ultrasound negative for DVT. ABIs September 2020 normal.  Since last seen,   Current Outpatient Medications  Medication Sig Dispense Refill  . Acetaminophen 325 MG CAPS Take 325 mg by mouth 3 (three) times daily as needed.    Marland Kitchen aspirin 81 MG chewable tablet Chew 81 mg by mouth daily.    Marland Kitchen atorvastatin (LIPITOR) 80 MG tablet Take 1 tablet (80 mg total) by mouth daily at 6 PM. 60 tablet 0  . brimonidine (ALPHAGAN) 0.2 % ophthalmic solution brimonidine 0.2 % eye drops  INSTILL 1 DROP INTO BOTH EYES TWICE A DAY    . Calcium Carbonate-Vitamin D (CALTRATE 600+D PO) Take 1 tablet by mouth 2 (two) times daily.    . cephALEXin (KEFLEX) 250 MG capsule Take 250 mg by mouth at bedtime.    . chlorpheniramine (CHLOR-TRIMETON) 4 MG tablet Take 4 mg by mouth every 4 (four) hours as needed for allergies.    Marland Kitchen denosumab (PROLIA) 60 MG/ML SOSY injection See admin instructions.    . diclofenac sodium (VOLTAREN) 1 % GEL diclofenac 1 % topical gel  AS DIRECTED TWICE A DAY AS NEEDED TRANSDERMAL 14 DAYS    . dorzolamide-timolol (COSOPT) 22.3-6.8 MG/ML ophthalmic solution Place 1 drop into both eyes 2 (two) times daily.     . famotidine (PEPCID) 20 MG tablet Take 20 mg by mouth daily.    Marland Kitchen FLUZONE HIGH-DOSE QUADRIVALENT 0.7 ML SUSY     . furosemide (LASIX) 20 MG tablet Take 20 mg by mouth as needed.     . latanoprost (XALATAN) 0.005  % ophthalmic solution latanoprost 0.005 % eye drops  INSTILL 1 DROP INTO RIGHT EYE AT BEDTIME    . losartan (COZAAR) 25 MG tablet Take 50 mg by mouth daily.    Marland Kitchen losartan (COZAAR) 50 MG tablet     . metoprolol (LOPRESSOR) 50 MG tablet Take 25 mg by mouth 2 (two) times daily.    . Multiple Vitamins-Minerals (MULTIPLE VITAMINS/WOMENS PO) Take 1 tablet by mouth daily. W/vitamin D    . Polyethyl Glycol-Propyl Glycol (SYSTANE OP) Apply 1 drop to eye 2 (two) times daily.     No current facility-administered medications for this visit.     Past Medical History:  Diagnosis Date  . Anemia   . Arthritis   . Glaucoma    OU  . Gout, joint   . Hypertension   . Hypertensive retinopathy    OU  . Macular degeneration    OU  . NSTEMI (non-ST elevated myocardial infarction) ([facility]) 08/2015  . Stroke [facility])     Past Surgical History:  Procedure Laterality Date  . ABDOMINAL HYSTERECTOMY    . BREAST SURGERY     CYST  . CARDIAC CATHETERIZATION N/A 08/18/2015   Procedure: Left Heart Cath and Coronary Angiography;  Surgeon: Lennette Bihari, MD;  Location: [facility] INVASIVE CV LAB;  Service: Cardiovascular;  Laterality: N/A;  . CARDIAC CATHETERIZATION  08/18/2015   Procedure: Coronary Stent Intervention;  Surgeon: Lennette Bihari, MD;  Location: [facility] INVASIVE CV LAB;  Service: Cardiovascular;;  . CATARACT EXTRACTION Bilateral   . DILATION AND CURETTAGE OF UTERUS    . EYE SURGERY Bilateral   . HERNIA REPAIR    . IRIDOTOMY / IRIDECTOMY Right   . JOINT REPLACEMENT  left knee  . REFRACTIVE SURGERY Right 2017    Social History   Socioeconomic History  . Marital status: Divorced    Spouse name: Not on file  . Number of children: 3  . Years of education: 1  . Highest education level: Not on file  Occupational History    Comment: retired  Tobacco Use  . Smoking status: Never Smoker  . Smokeless tobacco: Never Used  Substance and Sexual Activity  . Alcohol use: No    Alcohol/week: 0.0 standard drinks   . Drug use: No  . Sexual activity: Never    Birth control/protection: Post-menopausal  Other Topics Concern  . Not on file  Social History Narrative   Patient is single and lives at home alone.   Retired   Education 12th grade    Right handed   Caffeine  sometimes   Social Determinants of Corporate investment banker Strain: Not on file  Food Insecurity: Not on file  Transportation Needs: Not on file  Physical Activity: Not on file  Stress: Not on file  Social Connections: Not on file  Intimate Partner Violence: Not on file    Family History  Problem Relation Age of Onset  . Heart disease Mother   . Stroke Mother     ROS: no fevers or chills, productive cough, hemoptysis, dysphasia, odynophagia, melena, hematochezia, dysuria, hematuria, rash, seizure activity, orthopnea, PND, pedal edema, claudication. Remaining systems are negative.  Physical Exam: Well-developed well-nourished in no acute distress.  Skin is warm and dry.  HEENT is normal.  Neck is supple.  Chest is clear to auscultation with normal expansion.  Cardiovascular exam is regular rate and rhythm.  Abdominal exam nontender or distended. No masses palpated. Extremities show no edema. neuro grossly intact  ECG-sinus bradycardia at a rate of 57, inferior lateral T wave inversion; inferior lateral T wave inversion new compared to previous..  Personally reviewed  A/P  1 CAD-electrocardiogram today shows new inferolateral T wave inversion.  However she denies chest pain and wants only conservative measures.  I think this is appropriate given patient's age.  She is also a no CODE BLUE with no defibrillation, CPR or intubation.  Continue medical therapy with aspirin and statin.    2 aortic stenosis murmur on examination-does not sound severe.  Patient is 85 and would not consider TAVR in the future which I think is appropriate.  We will not pursue follow-up echocardiograms.  3 hypertension-blood pressure  controlled.  Continue present medications. Check BMET.  4 hyperlipidemia-continue statin.  5 lower extremity edema-continue diuretic at present dose.  Check potassium and renal function.  Olga Millers, MD

## 2020-12-08 DIAGNOSIS — I130 Hypertensive heart and chronic kidney disease with heart failure and stage 1 through stage 4 chronic kidney disease, or unspecified chronic kidney disease: Secondary | ICD-10-CM | POA: Diagnosis not present

## 2020-12-08 DIAGNOSIS — N189 Chronic kidney disease, unspecified: Secondary | ICD-10-CM | POA: Diagnosis not present

## 2020-12-08 DIAGNOSIS — M1990 Unspecified osteoarthritis, unspecified site: Secondary | ICD-10-CM | POA: Diagnosis not present

## 2020-12-08 DIAGNOSIS — I5032 Chronic diastolic (congestive) heart failure: Secondary | ICD-10-CM | POA: Diagnosis not present

## 2020-12-08 DIAGNOSIS — R296 Repeated falls: Secondary | ICD-10-CM | POA: Diagnosis not present

## 2020-12-08 DIAGNOSIS — E1122 Type 2 diabetes mellitus with diabetic chronic kidney disease: Secondary | ICD-10-CM | POA: Diagnosis not present

## 2020-12-08 DIAGNOSIS — I272 Pulmonary hypertension, unspecified: Secondary | ICD-10-CM | POA: Diagnosis not present

## 2020-12-08 DIAGNOSIS — I2510 Atherosclerotic heart disease of native coronary artery without angina pectoris: Secondary | ICD-10-CM | POA: Diagnosis not present

## 2020-12-08 DIAGNOSIS — Z8673 Personal history of transient ischemic attack (TIA), and cerebral infarction without residual deficits: Secondary | ICD-10-CM | POA: Diagnosis not present

## 2020-12-08 DIAGNOSIS — N390 Urinary tract infection, site not specified: Secondary | ICD-10-CM | POA: Diagnosis not present

## 2020-12-09 ENCOUNTER — Telehealth: Payer: Self-pay | Admitting: Podiatry

## 2020-12-09 ENCOUNTER — Other Ambulatory Visit: Payer: Self-pay

## 2020-12-09 ENCOUNTER — Encounter: Payer: Self-pay | Admitting: Podiatry

## 2020-12-09 ENCOUNTER — Ambulatory Visit: Payer: Medicare Other | Admitting: Podiatry

## 2020-12-09 DIAGNOSIS — M2011 Hallux valgus (acquired), right foot: Secondary | ICD-10-CM

## 2020-12-09 DIAGNOSIS — M2042 Other hammer toe(s) (acquired), left foot: Secondary | ICD-10-CM

## 2020-12-09 DIAGNOSIS — E08621 Diabetes mellitus due to underlying condition with foot ulcer: Secondary | ICD-10-CM

## 2020-12-09 DIAGNOSIS — E1142 Type 2 diabetes mellitus with diabetic polyneuropathy: Secondary | ICD-10-CM

## 2020-12-09 DIAGNOSIS — M2041 Other hammer toe(s) (acquired), right foot: Secondary | ICD-10-CM

## 2020-12-09 DIAGNOSIS — M2012 Hallux valgus (acquired), left foot: Secondary | ICD-10-CM

## 2020-12-09 DIAGNOSIS — L97511 Non-pressure chronic ulcer of other part of right foot limited to breakdown of skin: Secondary | ICD-10-CM

## 2020-12-09 NOTE — Telephone Encounter (Signed)
Thank you :)

## 2020-12-09 NOTE — Telephone Encounter (Signed)
Called pts daughter Waynetta Sandy) per Dr Eloy End to see if we can get pt in to see EJ on 3.25.2022.  She was on her way to PT and will call me tomorrow.

## 2020-12-10 DIAGNOSIS — R350 Frequency of micturition: Secondary | ICD-10-CM | POA: Diagnosis not present

## 2020-12-11 DIAGNOSIS — I130 Hypertensive heart and chronic kidney disease with heart failure and stage 1 through stage 4 chronic kidney disease, or unspecified chronic kidney disease: Secondary | ICD-10-CM | POA: Diagnosis not present

## 2020-12-11 DIAGNOSIS — N189 Chronic kidney disease, unspecified: Secondary | ICD-10-CM | POA: Diagnosis not present

## 2020-12-11 DIAGNOSIS — I272 Pulmonary hypertension, unspecified: Secondary | ICD-10-CM | POA: Diagnosis not present

## 2020-12-11 DIAGNOSIS — M1990 Unspecified osteoarthritis, unspecified site: Secondary | ICD-10-CM | POA: Diagnosis not present

## 2020-12-11 DIAGNOSIS — I5032 Chronic diastolic (congestive) heart failure: Secondary | ICD-10-CM | POA: Diagnosis not present

## 2020-12-11 DIAGNOSIS — Z8673 Personal history of transient ischemic attack (TIA), and cerebral infarction without residual deficits: Secondary | ICD-10-CM | POA: Diagnosis not present

## 2020-12-11 DIAGNOSIS — R296 Repeated falls: Secondary | ICD-10-CM | POA: Diagnosis not present

## 2020-12-11 DIAGNOSIS — E1122 Type 2 diabetes mellitus with diabetic chronic kidney disease: Secondary | ICD-10-CM | POA: Diagnosis not present

## 2020-12-11 DIAGNOSIS — N390 Urinary tract infection, site not specified: Secondary | ICD-10-CM | POA: Diagnosis not present

## 2020-12-11 DIAGNOSIS — I2510 Atherosclerotic heart disease of native coronary artery without angina pectoris: Secondary | ICD-10-CM | POA: Diagnosis not present

## 2020-12-12 DIAGNOSIS — I2510 Atherosclerotic heart disease of native coronary artery without angina pectoris: Secondary | ICD-10-CM | POA: Diagnosis not present

## 2020-12-12 DIAGNOSIS — I130 Hypertensive heart and chronic kidney disease with heart failure and stage 1 through stage 4 chronic kidney disease, or unspecified chronic kidney disease: Secondary | ICD-10-CM | POA: Diagnosis not present

## 2020-12-12 DIAGNOSIS — Z8673 Personal history of transient ischemic attack (TIA), and cerebral infarction without residual deficits: Secondary | ICD-10-CM | POA: Diagnosis not present

## 2020-12-12 DIAGNOSIS — I272 Pulmonary hypertension, unspecified: Secondary | ICD-10-CM | POA: Diagnosis not present

## 2020-12-12 DIAGNOSIS — I5032 Chronic diastolic (congestive) heart failure: Secondary | ICD-10-CM | POA: Diagnosis not present

## 2020-12-12 DIAGNOSIS — E1122 Type 2 diabetes mellitus with diabetic chronic kidney disease: Secondary | ICD-10-CM | POA: Diagnosis not present

## 2020-12-12 DIAGNOSIS — N390 Urinary tract infection, site not specified: Secondary | ICD-10-CM | POA: Diagnosis not present

## 2020-12-12 DIAGNOSIS — M1990 Unspecified osteoarthritis, unspecified site: Secondary | ICD-10-CM | POA: Diagnosis not present

## 2020-12-12 DIAGNOSIS — R296 Repeated falls: Secondary | ICD-10-CM | POA: Diagnosis not present

## 2020-12-12 DIAGNOSIS — N189 Chronic kidney disease, unspecified: Secondary | ICD-10-CM | POA: Diagnosis not present

## 2020-12-14 NOTE — Progress Notes (Signed)
[facility] - Clinic Note  12/16/2020     CHIEF COMPLAINT Patient presents for Retina Follow Up   HISTORY OF PRESENT ILLNESS: [name] is a 87 y.o. female who presents to the clinic today for:  HPI    Retina Follow Up    Patient presents with  CRVO/BRVO.  In right eye.  This started years ago.  Severity is severe.  Duration of 6 weeks.  Since onset it is stable.  I, the attending physician,  performed the HPI with the patient and updated documentation appropriately.          Comments    87 y/o female pt here for 6 wk f/u for inferior HRVO w/CME OD and exu ARMD OD.  No change in [place] OU.  Denies pain, FOL, floaters.  Latanoprost QHS OD, Dorzolamide/Timolol BID OU, Brimonidine BID OU.       Last edited by Rennis Chris, MD on 12/16/2020  9:16 AM. (History)    Pt states vision is okay, she feels like she might have more peripheral vision in her right eye than she had before, her daughter states she is not on PF anymore and is only taking her pressure drops  Referring physician: Sallye Lat, MD [address] [place],  [place] [address]  HISTORICAL INFORMATION:   Selected notes from the MEDICAL RECORD NUMBER Referred by Dr. Marchelle Gearing for concern of RVO OS   CURRENT MEDICATIONS: Current Outpatient Medications (Ophthalmic Drugs)  Medication Sig  . brimonidine (ALPHAGAN) 0.2 % ophthalmic solution brimonidine 0.2 % eye drops  INSTILL 1 DROP INTO BOTH EYES TWICE A DAY  . dorzolamide-timolol (COSOPT) 22.3-6.8 MG/ML ophthalmic solution Place 1 drop into both eyes 2 (two) times daily.   Marland Kitchen latanoprost (XALATAN) 0.005 % ophthalmic solution latanoprost 0.005 % eye drops  INSTILL 1 DROP INTO RIGHT EYE AT BEDTIME  . Netarsudil Dimesylate 0.02 % SOLN Rhopressa 0.02 % eye drops  INSTILL 1 DROP INTO AFFECTED EYE(S) BY OPHTHALMIC ROUTE ONCE DAILY INTHE EVENING  . atropine 1 % ophthalmic solution Place one drop into the right eye 2 (two) times daily for 14  days. Do not take tonight.  Dr. Loraine Grip will evaluate your post-operative status tomorrow and may make changes to the duration and dose of this medication. (Patient not taking: Reported on 12/16/2020)  . bimatoprost (LUMIGAN) 0.01 % SOLN Place 1 drop into the right eye at bedtime.  (Patient not taking: Reported on 12/16/2020)  . ketorolac (ACULAR) 0.5 % ophthalmic solution Place one drop into the right eye 4 (four) times daily for 28 days. (Patient not taking: Reported on 12/16/2020)  . moxifloxacin (VIGAMOX) 0.5 % ophthalmic solution Apply 1 drop to eye 3 (three) times daily. (Patient not taking: Reported on 12/16/2020)  . neomycin-polymyxin-dexameth (MAXITROL) 0.1 % OINT Do not use tonight!   Apply a strip of ointment the size of a grain of rice to RIGHT eye at bedtime. This must be the last eye medicine of the day.   Dr. Loraine Grip will evaluate your post-operative status tomorrow and may make changes to the duration and dose of this medication. (Patient not taking: Reported on 12/16/2020)  . ofloxacin (OCUFLOX) 0.3 % ophthalmic solution 1 drop into affected eye (Patient not taking: Reported on 12/16/2020)  . Polyethyl Glycol-Propyl Glycol (SYSTANE OP) Apply 1 drop to eye 2 (two) times daily. (Patient not taking: Reported on 12/16/2020)  . prednisoLONE acetate (PRED FORTE) 1 % ophthalmic suspension 1 gtt  4 times a day for one week, then 3 times a day for one week, then 2 times a day for one week, then 1 time a day for one week, then stop right eye. (Patient not taking: Reported on 12/16/2020)  . prednisoLONE acetate (PRED FORTE) 1 % ophthalmic suspension  (Patient not taking: Reported on 12/16/2020)   No current facility-administered medications for this visit. (Ophthalmic Drugs)   Current Outpatient Medications (Other)  Medication Sig  . Acetaminophen 325 MG CAPS Take 325 mg by mouth 3 (three) times daily as needed.  Marland Kitchen aspirin 81 MG chewable tablet Chew 81 mg by mouth daily.  Marland Kitchen atorvastatin (LIPITOR) 80 MG tablet  Take 1 tablet (80 mg total) by mouth daily at 6 PM.  . Calcium Carbonate-Vitamin D (CALTRATE 600+D PO) Take 1 tablet by mouth 2 (two) times daily.  . cefUROXime (CEFTIN) 500 MG tablet 1 tablet  . cephALEXin (KEFLEX) 250 MG capsule Take 250 mg by mouth at bedtime.  . chlorpheniramine (CHLOR-TRIMETON) 4 MG tablet Take 4 mg by mouth every 4 (four) hours as needed for allergies.  . ciprofloxacin (CIPRO) 250 MG tablet 1 tablet  . ciprofloxacin (CIPRO) 500 MG tablet SMARTSIG:0.5 Tablet(s) By Mouth Every 12 Hours  . ciprofloxacin (CIPRO) 500 MG tablet ciprofloxacin 500 mg tablet  . denosumab (PROLIA) 60 MG/ML SOSY injection See admin instructions.  . diclofenac sodium (VOLTAREN) 1 % GEL diclofenac 1 % topical gel  AS DIRECTED TWICE A DAY AS NEEDED TRANSDERMAL 14 DAYS  . famotidine (PEPCID) 20 MG tablet Take 20 mg by mouth daily.  Marland Kitchen FLUZONE HIGH-DOSE QUADRIVALENT 0.7 ML SUSY   . furosemide (LASIX) 20 MG tablet Take 20 mg by mouth as needed.   . hydrocortisone 2.5 % cream 1 application to affected area as needed  . losartan (COZAAR) 25 MG tablet Take 50 mg by mouth daily.  Marland Kitchen losartan (COZAAR) 50 MG tablet   . metoprolol (LOPRESSOR) 50 MG tablet Take 25 mg by mouth 2 (two) times daily.  . Multiple Vitamins-Minerals (MULTIPLE VITAMINS/WOMENS PO) Take 1 tablet by mouth daily. W/vitamin D  . MYRBETRIQ 25 MG TB24 tablet Take 25 mg by mouth daily.  . pantoprazole (PROTONIX) 40 MG tablet Take 1 tablet (40 mg total) by mouth daily. Take 30-60 min before first meal of the day  . Pediatric Multivitamins-Iron (FLINTSTONES W/IRON) 18 MG CHEW 2 tablets  . predniSONE (DELTASONE) 5 MG tablet 4 tablet  . Zoster Vaccine Adjuvanted [facility]) injection    No current facility-administered medications for this visit. (Other)      REVIEW OF SYSTEMS: ROS    Positive for: Neurological, Genitourinary, Musculoskeletal, Cardiovascular, Eyes   Negative for: Constitutional, Gastrointestinal, Skin, HENT, Endocrine,  Respiratory, Psychiatric, Allergic/Imm, Heme/Lymph   Last edited by Celine Mans, COA on 12/16/2020  8:42 AM. (History)       ALLERGIES Allergies  Allergen Reactions  . Meloxicam Hives  . Other Other (See Comments)  . Guaifenesin Er Rash    Other reaction(s): rash  . Nitrofuran Derivatives Rash    PAST MEDICAL HISTORY Past Medical History:  Diagnosis Date  . Anemia   . Arthritis   . Glaucoma    OU  . Gout, joint   . Hypertension   . Hypertensive retinopathy    OU  . Macular degeneration    OU  . NSTEMI (non-ST elevated myocardial infarction) (HCC) 08/2015  . Stroke [facility])    Past Surgical History:  Procedure Laterality Date  . ABDOMINAL HYSTERECTOMY    .  BREAST SURGERY     CYST  . CARDIAC CATHETERIZATION N/A 08/18/2015   Procedure: Left Heart Cath and Coronary Angiography;  Surgeon: Lennette Bihari, MD;  [facility] INVAS[facility] CV LAB;  Service: Cardiovascular;  Laterality: N/A;  . CARDIAC CATHETERIZATION  08/18/2015   Procedure: Coronary Stent Intervention;  Surgeon: Lennette Bihari, MD;  Location: [facility] INVASIVE CV LAB;  Service: Cardiovascular;;  . CATARACT EXTRACTION Bilateral   . DILATION AND CURETTAGE OF UTERUS    . EYE SURGERY Bilateral   . HERNIA REPAIR    . IRIDOTOMY / IRIDECTOMY Right   . JOINT REPLACEMENT  left knee  . REFRACTIVE SURGERY Right 2017    FAMILY HISTORY Family History  Problem Relation Age of Onset  . Heart disease Mother   . Stroke Mother     SOCIAL HISTORY Social History   Tobacco Use  . Smoking status: Never Smoker  . Smokeless tobacco: Never Used  Substance Use Topics  . Alcohol use: No    Alcohol/week: 0.0 standard drinks  . Drug use: No         OPHTHALMIC EXAM:  Base Eye Exam    Visual Acuity (Snellen - Linear)      Right Left   Dist cc CF @ face (nasally) 20/25 -2   Dist ph cc NI NI   Correction: Glasses       Tonometry (Tonopen, 8:46 AM)      Right Left   Pressure 8 10       Pupils      Dark Light Shape  React APD   Right 3 2 Round Minimal None   Left 3 2 Round Minimal None       Visual Fields (Counting fingers)      Left Right    Full    Restrictions  Total inferior temporal, superior nasal, inferior nasal deficiencies; Partial outer superior temporal deficiency       Extraocular Movement      Right Left    Full Full       Neuro/Psych    Oriented x3: Yes   Mood/Affect: Normal       Dilation    Both eyes: 1.0% Mydriacyl, 2.5% Phenylephrine @ 8:46 AM        Slit Lamp and Fundus Exam    Slit Lamp Exam      Right Left   Lids/Lashes Dermatochalasis - upper lid, mild Meibomian gland dysfunction Dermatochalasis - upper lid, mild Meibomian gland dysfunction   Conjunctiva/Sclera White and quiet Trace Injection   Cornea Arcus, tear film debris, 2-3+ PEE, 1+KP, 1+ Descemet's folds, well healed temporal cataract wounds Arcus, 2-3+ inferior Punctate epithelial erosions, Well healed cataract wounds   Anterior Chamber Deep and quiet, 1-2+cell/pigment Deep and quiet   Iris Round and poorly dilated Round and moderately dilated to 56mm   Lens PC IOL in good position with open PC PC IOL in good position with open PC   Vitreous Vitreous syneresis, +RBC, diffuse VH --clearing Vitreous syneresis, Posterior vitreous detachment       Fundus Exam      Right Left   Disc White, 3+pallor, +cupping, thin inf rim and temp PPA 1+ Pallor, Sharp rim, +cupping, temporal Peripapillary atrophy   C/D Ratio 0.8 0.6   Macula Hazy view from vitreous opacities -- Blunted foveal reflex, +edema, central pigment clumping, +drusen Flat, Blunted foveal reflex, RPE mottling and clumping, drusen, No heme or edema, focal cystic changes   Vessels Vascular attenuation, Tortuous  Vascular attenuation, mild Tortuousity   Periphery Hazy view, grossly Attached, obscured by VH and vitreous condensations Attached, No heme           IMAGING AND PROCEDURES  Imaging and Procedures for @TODAY @  OCT, Retina - OU - Both Eyes        Right Eye Quality was poor. Progression has been stable. Findings include subretinal hyper-reflective material, intraretinal fluid, intraretinal hyper-reflective material, epiretinal membrane, no SRF, outer retinal atrophy, abnormal foveal contour (Poor image quality -- persistent IRF ).   Left Eye Quality was borderline. Central Foveal Thickness: 259. Progression has worsened. Findings include normal foveal contour, no SRF, retinal drusen , intraretinal fluid (Interval development of focal IRF SN fovea).   Notes *Images captured and stored on drive  Diagnosis / Impression:  OD: Poor image quality -- persistent IRF OS: NFP, no SRF, +drusen -- Interval development of focal IRF SN fovea  Clinical management:  See below  Abbreviations: NFP - Normal foveal profile. CME - cystoid macular edema. PED - pigment epithelial detachment. IRF - intraretinal fluid. SRF - subretinal fluid. EZ - ellipsoid zone. ERM - epiretinal membrane. ORA - outer retinal atrophy. ORT - outer retinal tubulation. SRHM - subretinal hyper-reflective material                 ASSESSMENT/PLAN:    ICD-10-CM   1. Branch retinal vein occlusion of right eye with macular edema  H34.8310   2. Retinal edema  H35.81 OCT, Retina - OU - Both Eyes  3. Exudative age-related macular degeneration of right eye with active choroidal neovascularization (HCC)  H35.3211   4. Intermediate stage nonexudative age-related macular degeneration of left eye  H35.3122   5. Essential hypertension  I10   6. Hypertensive retinopathy of both eyes  H35.033   7. Primary open angle glaucoma (POAG) of both eyes, severe stage  H40.1133   8. Pseudophakia of both eyes  Z96.1     1,2. Inferior HRVO w/ CME, OD  - pt lost to f/u from 10/16/2019 to 07/14/2020 (9 mos)  - presenting BCVA CF 2' (09.14.20)  - pt subjectively reports decline in vision OD since Feb 2020  - presenting OCT shows severe CME/IRF temporal macula, +SRF overlying low PED  (?exudative ARMD component)  - S/p IVA OD #1 09.14.20, #2 (10.20.20), #3 (12.02.20), #4 (01.06.21), #5 (10.05.21), #6 (11.03.21), #7 (12.01.21), #8 (12.29.22)  - OCT -- Poor image quality -- persistent CME inferior macula  - persistent VH / vitreous condensation -- improving  - discussed findings, poor prognosis and treatment options             - Dr. Dione BoozeGroat and patient discussed treatment may not be necessary at this time OD.  Agree, no treatment OD today.    - f/u 4-6 weeks, DFE, OCT, possible injection  3. Exudative age related macular degeneration, OD    - initial OCT showed +IRF/SRF -- SRF overlying low lying PED  - exam shows +CNVM with surrounding heme -- improved today  - suspect exudative ARMD component complicating HRVO w/ CME  - S/p IVA OD #1 (09.14.20), #2 (10.20.20), #3 (12.02.20), #4 (01.06.21), #5 (10.05.21), #6 (11.03.21), #7 (12.01.21), #8 (12.29.22)  - f/u in 4-6 weeks  4. Age related macular degeneration, non-exudative, OS  - The incidence, anatomy, and pathology of dry AMD, risk of progression, and the AREDS and AREDS 2 study including smoking risks discussed with patient.  - OCT shows interval development of focal cystic changes, but  no heme or edema visible on exam  - recommend close observation  - cont Amsler grid monitoring  - f/u 4-6 wks  5,6. Hypertensive retinopathy OU  - discussed importance of tight BP control  - monitor  7. POAG OU -- severe stage  - under the expert management of Dr. Marchelle Gearing  - s/p SLT OD 3.23.2017  - s/p TDC OD w/ Dr. Loraine Grip, 3.30.21  - IOP good today at 8, 10  - pt on latanoprost qhs OD, dorzolamide/timolol bid OU, and brimonidine bid OU  8. Pseudophakia OU  - s/p CE/IOL OU  - beautiful surgeries, doing well  - monitor  9. Dry eyes OU (OD > OS)  - recommend artificial tears and lubricating ointment as needed   Ophthalmic Meds Ordered this visit:  No orders of the defined types were placed in this encounter.      Return  for f/u 4-6 weeks, HRVO OD, DFE, OCT.  There are no Patient Instructions on file for this visit.   Explained the diagnoses, plan, and follow up with the patient and they expressed understanding.  Patient expressed understanding of the importance of proper follow up care.   This document serves as a record of services personally performed by Karie Chimera, MD, PhD. It was created on their behalf by Annalee Genta, COMT. The creation of this record is the provider's dictation and/or activities during the visit.  Electronically signed by: Annalee Genta, COMT 12/16/20 9:22 AM  This document serves as a record of services personally performed by Karie Chimera, MD, PhD. It was created on their behalf by Glee Arvin. Manson Passey, OA an ophthalmic technician. The creation of this record is the provider's dictation and/or activities during the visit.    Electronically signed by: Glee Arvin. Manson Passey, [place] 03.09.2022 9:22 AM   Karie Chimera, M.D., Ph.D. Diseases & Surgery of the Retina and Vitreous [facility]  I have reviewed the above documentation for accuracy and completeness, and I agree with the above. Karie Chimera, M.D., Ph.D. 12/16/20 9:22 AM   Abbreviations: M myopia (nearsighted); A astigmatism; H hyperopia (farsighted); P presbyopia; Mrx spectacle prescription;  CTL contact lenses; OD right eye; OS left eye; OU both eyes  XT exotropia; ET esotropia; PEK punctate epithelial keratitis; PEE punctate epithelial erosions; DES dry eye syndrome; MGD meibomian gland dysfunction; ATs artificial tears; PFAT's preservative free artificial tears; NSC nuclear sclerotic cataract; PSC posterior subcapsular cataract; ERM epi-retinal membrane; PVD posterior vitreous detachment; RD retinal detachment; DM diabetes mellitus; DR diabetic retinopathy; NPDR non-proliferative diabetic retinopathy; PDR proliferative diabetic retinopathy; CSME clinically significant macular edema; DME diabetic macular  edema; dbh dot blot hemorrhages; CWS cotton wool spot; POAG primary open angle glaucoma; C/D cup-to-disc ratio; HVF humphrey visual field; GVF goldmann visual field; OCT optical coherence tomography; IOP intraocular pressure; BRVO Branch retinal vein occlusion; CRVO central retinal vein occlusion; CRAO central retinal artery occlusion; BRAO branch retinal artery occlusion; RT retinal tear; SB scleral buckle; PPV pars plana vitrectomy; VH Vitreous hemorrhage; PRP panretinal laser photocoagulation; IVK intravitreal kenalog; VMT vitreomacular traction; MH Macular hole;  NVD neovascularization of the disc; NVE neovascularization elsewhere; AREDS age related eye disease study; ARMD age related macular degeneration; POAG primary open angle glaucoma; EBMD epithelial/anterior basement membrane dystrophy; ACIOL anterior chamber intraocular lens; IOL intraocular lens; PCIOL posterior chamber intraocular lens; Phaco/IOL phacoemulsification with intraocular lens placement; PRK photorefractive keratectomy; LASIK laser assisted in situ keratomileusis; HTN hypertension; DM diabetes mellitus; COPD chronic  obstructive pulmonary disease

## 2020-12-14 NOTE — Progress Notes (Signed)
Subjective:  Patient ID: [name], female    DOB: 07-05-1923,  MRN: [id number]  87 y.o. female is seen for follow up diabetic foot ulcer ball of right foot.  Her granddaughter, Marcelino Duster,  is present during today's visit and accompanies her on every visit.  She states her right foot feels sore. Her daughter, Waynetta Sandy, also states there seems to be a new area developing on the lateral aspect of her forefoot area.   She will be taking cephalexin, 500 mg once daily indefinitely  for chronic UTI.   Allergies  Allergen Reactions  . Meloxicam Hives  . Other Other (See Comments)  . Guaifenesin Er Rash    Other reaction(s): rash  . Nitrofuran Derivatives Rash    Objective:  Physical Exam: There were no vitals filed for this visit.   87 y.o. female pleasant Caucasian female in NAD. AAO x 3.  Vascular Examination: Capillary fill time to digits <3 seconds b/l lower extremities. Palpable pedal pulses b/l LE. Pedal hair absent. Lower extremity skin temperature gradient within normal limits. Nonpitting edema noted b/l LE.Marland Kitchen  Dermatological Examination: Pedal skin with normal turgor, texture and tone bilaterally. No interdigital macerations bilaterally. Toenails 1-5 b/l well maintained with adequate length. No erythema, no edema, no drainage, no flocculence. No erythema, no edema, no drainage, no flocculence.   Wound Location: submet head 3 right foot  There is a moderate amount of devitalized hyperkeratotic tissue present.   Predebridement Wound Measurement: 0.7 x 0.8 cm with hyperkeratotic roof and subdermal hemorrhage Postdebridement Wound Measurement: 1.5 x 0.8 x 0.2 cm and is partial thickness submet head 3 right foot.   Wound Base:  Epithelial Peri-wound: Calloused and peeling skin Exudate: None: wound tissue dry Blood Loss during debridement: 0 cc's. Description of tissue removed from ulceration today:  Hyperkeratosis and devitalized subcutaneous tissue. Signs of clinical bacterial  infection are absent. Material in wound which inhibits healing/promotes adjacent tissue breakdown: Hyperkeratosis  Musculoskeletal Examination: Normal muscle strength 5/5 to all lower extremity muscle groups bilaterally. No pain crepitus or joint limitation noted with ROM b/l. Plantarflexed metatarsal(s) right lower extremity. Hallux valgus with bunion deformity noted b/l lower extremities. Hammertoes noted to the 2-5 bilaterally. Noted plantar fat pad atrophy of forefoot area b/l lower extremities.  Neurological Examination: Protective sensation diminished with 10g monofilament b/l. Proprioception intact bilaterally.  Assessment:   1. Diabetic ulcer of right foot associated with diabetes mellitus due to underlying condition, limited to breakdown of skin, unspecified part of foot (HCC)   2. Hallux valgus, acquired, bilateral   3. Acquired hammertoes of both feet   4. Diabetic peripheral neuropathy associated with type 2 diabetes mellitus (HCC)    Plan:  -Advised patient to wear her offloaded Darco shoe at all times. Do not wear house slipper on right foot. -Patient was evaluated and treated and all questions answered.  -right 1st metatarsal head: continue daily use of tubefoam bunion shield. -Patient/POA/Family member educated on diagnosis and treatment plan of routine ulcer debridement/wound care.  -Type/amount of devitalized tissue removed: devitalized hyperkeratotic tissue.  -She is on antibiotics long-term due to chronic UTI.  -Today's postdebridement measurements: 1.5 x 0.8 x 0.2 cm submet head 3 right foot ulcer size post-debridement: Ulcer cleansed with wound cleanser. Betadine ointment and dressing applied. Abstain from band-aids now as I feel they are irritating her skin and we will wrap her foot daily for the next 3 weeks.  -Wound responded well to today's debridement. -Patient risk factors affecting healing of ulcer:  diabetes, foot deformity, neuropathy CKD, fat pad atrophy b/l  feet, recurrent ulceration. -Daughter is well experienced/educated on the chronic ulceration of the right foot. Start daily dressing changes with Iodosorb Gel. -Frequency of debridement needed to achieve healing: every 2-3 weeks to prevent breakdown. Daughter knows to call office for myself or Dr. Ardelle Anton if condition worsens. -Will get her scheduled with [employer] for new diabetic shoe measurements.  -Call office if condition worsens or Ms. Franqui experiences any increased redness, drainage, swelling, warmth of right foot, fever, chills, night sweats, nausea, vomiting  Return in about 3 weeks (around 12/30/2020).  Freddie Breech, DPM

## 2020-12-16 ENCOUNTER — Encounter (INDEPENDENT_AMBULATORY_CARE_PROVIDER_SITE_OTHER): Payer: Self-pay | Admitting: Ophthalmology

## 2020-12-16 ENCOUNTER — Other Ambulatory Visit: Payer: Self-pay

## 2020-12-16 ENCOUNTER — Ambulatory Visit (INDEPENDENT_AMBULATORY_CARE_PROVIDER_SITE_OTHER): Payer: Medicare Other | Admitting: Ophthalmology

## 2020-12-16 DIAGNOSIS — Z961 Presence of intraocular lens: Secondary | ICD-10-CM

## 2020-12-16 DIAGNOSIS — H34831 Tributary (branch) retinal vein occlusion, right eye, with macular edema: Secondary | ICD-10-CM

## 2020-12-16 DIAGNOSIS — I10 Essential (primary) hypertension: Secondary | ICD-10-CM

## 2020-12-16 DIAGNOSIS — H3581 Retinal edema: Secondary | ICD-10-CM

## 2020-12-16 DIAGNOSIS — H353211 Exudative age-related macular degeneration, right eye, with active choroidal neovascularization: Secondary | ICD-10-CM

## 2020-12-16 DIAGNOSIS — H35033 Hypertensive retinopathy, bilateral: Secondary | ICD-10-CM

## 2020-12-16 DIAGNOSIS — H353122 Nonexudative age-related macular degeneration, left eye, intermediate dry stage: Secondary | ICD-10-CM | POA: Diagnosis not present

## 2020-12-16 DIAGNOSIS — H401133 Primary open-angle glaucoma, bilateral, severe stage: Secondary | ICD-10-CM

## 2020-12-17 DIAGNOSIS — M1990 Unspecified osteoarthritis, unspecified site: Secondary | ICD-10-CM | POA: Diagnosis not present

## 2020-12-17 DIAGNOSIS — I5032 Chronic diastolic (congestive) heart failure: Secondary | ICD-10-CM | POA: Diagnosis not present

## 2020-12-17 DIAGNOSIS — I2510 Atherosclerotic heart disease of native coronary artery without angina pectoris: Secondary | ICD-10-CM | POA: Diagnosis not present

## 2020-12-17 DIAGNOSIS — I272 Pulmonary hypertension, unspecified: Secondary | ICD-10-CM | POA: Diagnosis not present

## 2020-12-17 DIAGNOSIS — Z8673 Personal history of transient ischemic attack (TIA), and cerebral infarction without residual deficits: Secondary | ICD-10-CM | POA: Diagnosis not present

## 2020-12-17 DIAGNOSIS — R300 Dysuria: Secondary | ICD-10-CM | POA: Diagnosis not present

## 2020-12-17 DIAGNOSIS — I130 Hypertensive heart and chronic kidney disease with heart failure and stage 1 through stage 4 chronic kidney disease, or unspecified chronic kidney disease: Secondary | ICD-10-CM | POA: Diagnosis not present

## 2020-12-17 DIAGNOSIS — R296 Repeated falls: Secondary | ICD-10-CM | POA: Diagnosis not present

## 2020-12-17 DIAGNOSIS — E1122 Type 2 diabetes mellitus with diabetic chronic kidney disease: Secondary | ICD-10-CM | POA: Diagnosis not present

## 2020-12-17 DIAGNOSIS — N390 Urinary tract infection, site not specified: Secondary | ICD-10-CM | POA: Diagnosis not present

## 2020-12-17 DIAGNOSIS — R350 Frequency of micturition: Secondary | ICD-10-CM | POA: Diagnosis not present

## 2020-12-17 DIAGNOSIS — N189 Chronic kidney disease, unspecified: Secondary | ICD-10-CM | POA: Diagnosis not present

## 2020-12-21 ENCOUNTER — Ambulatory Visit (INDEPENDENT_AMBULATORY_CARE_PROVIDER_SITE_OTHER): Payer: Medicare Other | Admitting: Cardiology

## 2020-12-21 ENCOUNTER — Encounter: Payer: Self-pay | Admitting: Cardiology

## 2020-12-21 ENCOUNTER — Other Ambulatory Visit: Payer: Self-pay

## 2020-12-21 VITALS — BP 134/78 | HR 57 | Ht 60.0 in | Wt 130.0 lb

## 2020-12-21 DIAGNOSIS — E7800 Pure hypercholesterolemia, unspecified: Secondary | ICD-10-CM | POA: Diagnosis not present

## 2020-12-21 DIAGNOSIS — I2510 Atherosclerotic heart disease of native coronary artery without angina pectoris: Secondary | ICD-10-CM | POA: Diagnosis not present

## 2020-12-21 DIAGNOSIS — I10 Essential (primary) hypertension: Secondary | ICD-10-CM

## 2020-12-21 LAB — BASIC METABOLIC PANEL
BUN/Creatinine Ratio: 44 — ABNORMAL HIGH (ref 12–28)
BUN: 58 mg/dL — ABNORMAL HIGH (ref 10–36)
CO2: 17 mmol/L — ABNORMAL LOW (ref 20–29)
Calcium: 8.9 mg/dL (ref 8.7–10.3)
Chloride: 110 mmol/L — ABNORMAL HIGH (ref 96–106)
Creatinine, Ser: 1.32 mg/dL — ABNORMAL HIGH (ref 0.57–1.00)
Glucose: 85 mg/dL (ref 65–99)
Potassium: 5.9 mmol/L (ref 3.5–5.2)
Sodium: 139 mmol/L (ref 134–144)
eGFR: 37 mL/min/{1.73_m2} — ABNORMAL LOW (ref >59)

## 2020-12-21 NOTE — Patient Instructions (Addendum)

## 2020-12-22 ENCOUNTER — Telehealth: Payer: Self-pay | Admitting: Internal Medicine

## 2020-12-22 ENCOUNTER — Other Ambulatory Visit: Payer: Self-pay | Admitting: *Deleted

## 2020-12-22 ENCOUNTER — Telehealth: Payer: Self-pay | Admitting: *Deleted

## 2020-12-22 DIAGNOSIS — I272 Pulmonary hypertension, unspecified: Secondary | ICD-10-CM | POA: Diagnosis not present

## 2020-12-22 DIAGNOSIS — N189 Chronic kidney disease, unspecified: Secondary | ICD-10-CM | POA: Diagnosis not present

## 2020-12-22 DIAGNOSIS — I2510 Atherosclerotic heart disease of native coronary artery without angina pectoris: Secondary | ICD-10-CM | POA: Diagnosis not present

## 2020-12-22 DIAGNOSIS — Z8673 Personal history of transient ischemic attack (TIA), and cerebral infarction without residual deficits: Secondary | ICD-10-CM | POA: Diagnosis not present

## 2020-12-22 DIAGNOSIS — I130 Hypertensive heart and chronic kidney disease with heart failure and stage 1 through stage 4 chronic kidney disease, or unspecified chronic kidney disease: Secondary | ICD-10-CM | POA: Diagnosis not present

## 2020-12-22 DIAGNOSIS — R296 Repeated falls: Secondary | ICD-10-CM | POA: Diagnosis not present

## 2020-12-22 DIAGNOSIS — E875 Hyperkalemia: Secondary | ICD-10-CM

## 2020-12-22 DIAGNOSIS — I5032 Chronic diastolic (congestive) heart failure: Secondary | ICD-10-CM | POA: Diagnosis not present

## 2020-12-22 DIAGNOSIS — M1990 Unspecified osteoarthritis, unspecified site: Secondary | ICD-10-CM | POA: Diagnosis not present

## 2020-12-22 DIAGNOSIS — N390 Urinary tract infection, site not specified: Secondary | ICD-10-CM | POA: Diagnosis not present

## 2020-12-22 DIAGNOSIS — E1122 Type 2 diabetes mellitus with diabetic chronic kidney disease: Secondary | ICD-10-CM | POA: Diagnosis not present

## 2020-12-22 NOTE — Telephone Encounter (Signed)
Daughter of the patient returning Debra's call

## 2020-12-22 NOTE — Telephone Encounter (Signed)
Spoke with pt daughter, the losartan has been stopped. They will be able to come for lab work tomorrow morning. Order placed

## 2020-12-22 NOTE — Telephone Encounter (Signed)
Left message for beth to call

## 2020-12-22 NOTE — Telephone Encounter (Signed)
Received a call regarding critical potassium of 5.9. Spoke with patient's daughter and advised that the patient should stop taking her losartan 50mg . Her daughter confirmed understanding. She will need a repeat BMP on 3/16. Will sign out to day team to arrange follow-up blood work.   4/16, MD Cardiology [place]

## 2020-12-22 NOTE — Telephone Encounter (Signed)
-----   Message from Lewayne Bunting, MD sent at 12/22/2020  7:23 AM EDT ----- DC losartan; low k diet; bmet today Olga Millers

## 2020-12-23 ENCOUNTER — Telehealth: Payer: Self-pay | Admitting: *Deleted

## 2020-12-23 DIAGNOSIS — E875 Hyperkalemia: Secondary | ICD-10-CM | POA: Diagnosis not present

## 2020-12-23 LAB — BASIC METABOLIC PANEL
BUN/Creatinine Ratio: 42 — ABNORMAL HIGH (ref 12–28)
BUN: 63 mg/dL — ABNORMAL HIGH (ref 10–36)
CO2: 16 mmol/L — ABNORMAL LOW (ref 20–29)
Calcium: 8.7 mg/dL (ref 8.7–10.3)
Chloride: 104 mmol/L (ref 96–106)
Creatinine, Ser: 1.49 mg/dL — ABNORMAL HIGH (ref 0.57–1.00)
Glucose: 82 mg/dL (ref 65–99)
Potassium: 5.7 mmol/L — ABNORMAL HIGH (ref 3.5–5.2)
Sodium: 136 mmol/L (ref 134–144)
eGFR: 32 mL/min/{1.73_m2} — ABNORMAL LOW (ref >59)

## 2020-12-23 NOTE — Telephone Encounter (Addendum)
Left message for beth to call   ----- Message from Lewayne Bunting, MD sent at 12/23/2020  4:47 PM EDT ----- Continue off losartan; low K diet; change lasix to 20 mg every other day; repeat bmet one week [name]

## 2020-12-24 DIAGNOSIS — R350 Frequency of micturition: Secondary | ICD-10-CM | POA: Diagnosis not present

## 2020-12-24 NOTE — Addendum Note (Signed)
Addended by: Myna Hidalgo A on: 12/24/2020 03:59 PM   Modules accepted: Orders

## 2020-12-24 NOTE — Telephone Encounter (Signed)
Spoke to daughter (ok per DPR)- aware of results and recommendations.   Order placed for repeat in 1 week

## 2020-12-24 NOTE — Telephone Encounter (Signed)
Patient's daughter returning phone call.  °

## 2020-12-25 DIAGNOSIS — M1990 Unspecified osteoarthritis, unspecified site: Secondary | ICD-10-CM | POA: Diagnosis not present

## 2020-12-25 DIAGNOSIS — N189 Chronic kidney disease, unspecified: Secondary | ICD-10-CM | POA: Diagnosis not present

## 2020-12-25 DIAGNOSIS — R296 Repeated falls: Secondary | ICD-10-CM | POA: Diagnosis not present

## 2020-12-25 DIAGNOSIS — I2510 Atherosclerotic heart disease of native coronary artery without angina pectoris: Secondary | ICD-10-CM | POA: Diagnosis not present

## 2020-12-25 DIAGNOSIS — E1122 Type 2 diabetes mellitus with diabetic chronic kidney disease: Secondary | ICD-10-CM | POA: Diagnosis not present

## 2020-12-25 DIAGNOSIS — Z8673 Personal history of transient ischemic attack (TIA), and cerebral infarction without residual deficits: Secondary | ICD-10-CM | POA: Diagnosis not present

## 2020-12-25 DIAGNOSIS — I130 Hypertensive heart and chronic kidney disease with heart failure and stage 1 through stage 4 chronic kidney disease, or unspecified chronic kidney disease: Secondary | ICD-10-CM | POA: Diagnosis not present

## 2020-12-25 DIAGNOSIS — I272 Pulmonary hypertension, unspecified: Secondary | ICD-10-CM | POA: Diagnosis not present

## 2020-12-25 DIAGNOSIS — I5032 Chronic diastolic (congestive) heart failure: Secondary | ICD-10-CM | POA: Diagnosis not present

## 2020-12-25 DIAGNOSIS — N390 Urinary tract infection, site not specified: Secondary | ICD-10-CM | POA: Diagnosis not present

## 2020-12-31 DIAGNOSIS — E1122 Type 2 diabetes mellitus with diabetic chronic kidney disease: Secondary | ICD-10-CM | POA: Diagnosis not present

## 2020-12-31 DIAGNOSIS — I130 Hypertensive heart and chronic kidney disease with heart failure and stage 1 through stage 4 chronic kidney disease, or unspecified chronic kidney disease: Secondary | ICD-10-CM | POA: Diagnosis not present

## 2020-12-31 DIAGNOSIS — R350 Frequency of micturition: Secondary | ICD-10-CM | POA: Diagnosis not present

## 2020-12-31 DIAGNOSIS — I5032 Chronic diastolic (congestive) heart failure: Secondary | ICD-10-CM | POA: Diagnosis not present

## 2020-12-31 DIAGNOSIS — I2510 Atherosclerotic heart disease of native coronary artery without angina pectoris: Secondary | ICD-10-CM | POA: Diagnosis not present

## 2020-12-31 DIAGNOSIS — I272 Pulmonary hypertension, unspecified: Secondary | ICD-10-CM | POA: Diagnosis not present

## 2020-12-31 DIAGNOSIS — Z9181 History of falling: Secondary | ICD-10-CM | POA: Diagnosis not present

## 2020-12-31 DIAGNOSIS — Z8673 Personal history of transient ischemic attack (TIA), and cerebral infarction without residual deficits: Secondary | ICD-10-CM | POA: Diagnosis not present

## 2020-12-31 DIAGNOSIS — R296 Repeated falls: Secondary | ICD-10-CM | POA: Diagnosis not present

## 2020-12-31 DIAGNOSIS — N189 Chronic kidney disease, unspecified: Secondary | ICD-10-CM | POA: Diagnosis not present

## 2020-12-31 DIAGNOSIS — N390 Urinary tract infection, site not specified: Secondary | ICD-10-CM | POA: Diagnosis not present

## 2020-12-31 DIAGNOSIS — M1990 Unspecified osteoarthritis, unspecified site: Secondary | ICD-10-CM | POA: Diagnosis not present

## 2021-01-01 ENCOUNTER — Telehealth: Payer: Self-pay | Admitting: Cardiology

## 2021-01-01 DIAGNOSIS — I2510 Atherosclerotic heart disease of native coronary artery without angina pectoris: Secondary | ICD-10-CM

## 2021-01-01 NOTE — Telephone Encounter (Signed)
Lab tech reports the lab work done was drawn under dr pharr's name and we can not get the results. Daughter made aware dr Carolee Rota office is closed for today and will need to call them Monday to get those results.

## 2021-01-01 NOTE — Telephone Encounter (Signed)
Beth, Daughter of the patient called. Patient had a repeat BMET done yesterday and has not heard anything yet regarding the results of those labs.

## 2021-01-01 NOTE — Telephone Encounter (Signed)
Spoke with pt daughter, she reports the patient had labs drawn yesterday. Aware do not see results yet but will check with the lab.

## 2021-01-04 ENCOUNTER — Encounter: Payer: Self-pay | Admitting: Podiatry

## 2021-01-04 ENCOUNTER — Ambulatory Visit: Payer: Medicare Other | Admitting: Podiatry

## 2021-01-04 ENCOUNTER — Other Ambulatory Visit: Payer: Self-pay

## 2021-01-04 DIAGNOSIS — M2012 Hallux valgus (acquired), left foot: Secondary | ICD-10-CM

## 2021-01-04 DIAGNOSIS — M2041 Other hammer toe(s) (acquired), right foot: Secondary | ICD-10-CM

## 2021-01-04 DIAGNOSIS — E1142 Type 2 diabetes mellitus with diabetic polyneuropathy: Secondary | ICD-10-CM

## 2021-01-04 DIAGNOSIS — M2042 Other hammer toe(s) (acquired), left foot: Secondary | ICD-10-CM

## 2021-01-04 DIAGNOSIS — M2011 Hallux valgus (acquired), right foot: Secondary | ICD-10-CM

## 2021-01-04 DIAGNOSIS — L97512 Non-pressure chronic ulcer of other part of right foot with fat layer exposed: Secondary | ICD-10-CM

## 2021-01-04 NOTE — Telephone Encounter (Signed)
Left message for sandy at dr pharr's office to get recent lab work faxed to our office.

## 2021-01-04 NOTE — Progress Notes (Signed)
  Subjective:  Patient ID: [name], female    DOB: 12/04/1922,  MRN: [id number]  Chief Complaint  Patient presents with  . Foot Ulcer    Right foot ulcer. PT stated that it is painful     87 y.o. female presents with the above complaint. History confirmed with patient.  Ulcer has returned causing pain  Objective:  Physical Exam: warm, good capillary refill, no trophic changes or ulcerative lesions, normal DP and PT pulses and normal sensory exam.  Right Foot: Submetatarsal 3 and 4 ulceration has returned with full-thickness depth, overlying hyperkeratosis  Assessment:   1. Ulcer of right foot with fat layer exposed (HCC)   2. Diabetic peripheral neuropathy associated with type 2 diabetes mellitus (HCC)   3. Acquired hammertoes of both feet   4. Hallux valgus, acquired, bilateral      Plan:  Patient was evaluated and treated and all questions answered.  Ulcer right foot -Debridement as below. -Dressed with Iodosorb and salinocaine ointment, DSD. -Continue off-loading with surgical shoe. -Recommended a obtain urea cream to help soften the calluses and apply this around the periwound callused area daily with use of a pumice stone  Procedure: Excisional Debridement of Wound Rationale: Removal of non-viable soft tissue from the wound to promote healing.  Anesthesia: none Pre-Debridement Wound Measurements: Unmeasurable due to overlying hyperkeratosis Post-Debridement Wound Measurements:0.8 cm x 0.3 cm x 0.2 cm  Type of Debridement: Sharp Excisional Tissue Removed: Non-viable soft tissue Depth of Debridement: subcutaneous tissue. Technique: Sharp excisional debridement to bleeding, viable wound base.  Dressing: Dry, sterile, compression dressing. Disposition: Patient tolerated procedure well. Patient to return in 1 week for follow-up.  Return in about 3 weeks (around 01/25/2021) for ulcer right foot .       Return in about 3 weeks (around 01/25/2021) for ulcer right  foot .

## 2021-01-05 DIAGNOSIS — N189 Chronic kidney disease, unspecified: Secondary | ICD-10-CM | POA: Diagnosis not present

## 2021-01-05 DIAGNOSIS — I5032 Chronic diastolic (congestive) heart failure: Secondary | ICD-10-CM | POA: Diagnosis not present

## 2021-01-05 DIAGNOSIS — E1122 Type 2 diabetes mellitus with diabetic chronic kidney disease: Secondary | ICD-10-CM | POA: Diagnosis not present

## 2021-01-05 DIAGNOSIS — M1990 Unspecified osteoarthritis, unspecified site: Secondary | ICD-10-CM | POA: Diagnosis not present

## 2021-01-05 DIAGNOSIS — N390 Urinary tract infection, site not specified: Secondary | ICD-10-CM | POA: Diagnosis not present

## 2021-01-05 DIAGNOSIS — R296 Repeated falls: Secondary | ICD-10-CM | POA: Diagnosis not present

## 2021-01-05 DIAGNOSIS — Z8673 Personal history of transient ischemic attack (TIA), and cerebral infarction without residual deficits: Secondary | ICD-10-CM | POA: Diagnosis not present

## 2021-01-05 DIAGNOSIS — I130 Hypertensive heart and chronic kidney disease with heart failure and stage 1 through stage 4 chronic kidney disease, or unspecified chronic kidney disease: Secondary | ICD-10-CM | POA: Diagnosis not present

## 2021-01-05 DIAGNOSIS — I2510 Atherosclerotic heart disease of native coronary artery without angina pectoris: Secondary | ICD-10-CM | POA: Diagnosis not present

## 2021-01-05 DIAGNOSIS — I272 Pulmonary hypertension, unspecified: Secondary | ICD-10-CM | POA: Diagnosis not present

## 2021-01-06 ENCOUNTER — Encounter: Payer: Self-pay | Admitting: Cardiology

## 2021-01-06 NOTE — Telephone Encounter (Signed)
This encounter was created in error - please disregard.

## 2021-01-06 NOTE — Telephone Encounter (Signed)
Patients daughter says that someone was supposed to call her yesterday to get patients results but did not receive call. Would like for someone to give her a call this morning.

## 2021-01-06 NOTE — Telephone Encounter (Signed)
Spoke with pt daughter, aware labs are in the [place] office and will review once we return to that office tomorrow.

## 2021-01-07 DIAGNOSIS — E1122 Type 2 diabetes mellitus with diabetic chronic kidney disease: Secondary | ICD-10-CM | POA: Diagnosis not present

## 2021-01-07 DIAGNOSIS — R296 Repeated falls: Secondary | ICD-10-CM | POA: Diagnosis not present

## 2021-01-07 DIAGNOSIS — N390 Urinary tract infection, site not specified: Secondary | ICD-10-CM | POA: Diagnosis not present

## 2021-01-07 DIAGNOSIS — I130 Hypertensive heart and chronic kidney disease with heart failure and stage 1 through stage 4 chronic kidney disease, or unspecified chronic kidney disease: Secondary | ICD-10-CM | POA: Diagnosis not present

## 2021-01-07 DIAGNOSIS — Z8673 Personal history of transient ischemic attack (TIA), and cerebral infarction without residual deficits: Secondary | ICD-10-CM | POA: Diagnosis not present

## 2021-01-07 DIAGNOSIS — M1990 Unspecified osteoarthritis, unspecified site: Secondary | ICD-10-CM | POA: Diagnosis not present

## 2021-01-07 DIAGNOSIS — I2510 Atherosclerotic heart disease of native coronary artery without angina pectoris: Secondary | ICD-10-CM | POA: Diagnosis not present

## 2021-01-07 DIAGNOSIS — I5032 Chronic diastolic (congestive) heart failure: Secondary | ICD-10-CM | POA: Diagnosis not present

## 2021-01-07 DIAGNOSIS — N189 Chronic kidney disease, unspecified: Secondary | ICD-10-CM | POA: Diagnosis not present

## 2021-01-07 DIAGNOSIS — I272 Pulmonary hypertension, unspecified: Secondary | ICD-10-CM | POA: Diagnosis not present

## 2021-01-07 NOTE — Telephone Encounter (Signed)
Spoke with pt daughter, aware labs reviewed by dr Jens Som, her kidney function is better and the potassium is normal. The daughter reports she is getting some swelling since decreasing the furosemide to every other day. Per dr Jens Som she can increase back to daily if needed. If she increases she will need lab work in one week. Lab orders mailed to the pt. Pt daughter agreed with this plan.

## 2021-01-12 DIAGNOSIS — I5032 Chronic diastolic (congestive) heart failure: Secondary | ICD-10-CM | POA: Diagnosis not present

## 2021-01-12 DIAGNOSIS — E1122 Type 2 diabetes mellitus with diabetic chronic kidney disease: Secondary | ICD-10-CM | POA: Diagnosis not present

## 2021-01-12 DIAGNOSIS — I130 Hypertensive heart and chronic kidney disease with heart failure and stage 1 through stage 4 chronic kidney disease, or unspecified chronic kidney disease: Secondary | ICD-10-CM | POA: Diagnosis not present

## 2021-01-12 DIAGNOSIS — Z87440 Personal history of urinary (tract) infections: Secondary | ICD-10-CM | POA: Diagnosis not present

## 2021-01-12 DIAGNOSIS — R296 Repeated falls: Secondary | ICD-10-CM | POA: Diagnosis not present

## 2021-01-12 DIAGNOSIS — I2510 Atherosclerotic heart disease of native coronary artery without angina pectoris: Secondary | ICD-10-CM | POA: Diagnosis not present

## 2021-01-12 DIAGNOSIS — M1990 Unspecified osteoarthritis, unspecified site: Secondary | ICD-10-CM | POA: Diagnosis not present

## 2021-01-12 DIAGNOSIS — Z8673 Personal history of transient ischemic attack (TIA), and cerebral infarction without residual deficits: Secondary | ICD-10-CM | POA: Diagnosis not present

## 2021-01-12 DIAGNOSIS — I272 Pulmonary hypertension, unspecified: Secondary | ICD-10-CM | POA: Diagnosis not present

## 2021-01-12 DIAGNOSIS — N189 Chronic kidney disease, unspecified: Secondary | ICD-10-CM | POA: Diagnosis not present

## 2021-01-13 ENCOUNTER — Telehealth: Payer: Self-pay | Admitting: Cardiology

## 2021-01-13 NOTE — Telephone Encounter (Signed)
Called patient daughter- advised that the blood orders were in, if she comes here we can print them out when she is here. Daughter states that they may be coming tomorrow. Orders are in for Dr.Crenshaw and the results should come back to him- patient daughter verbalized understanding.

## 2021-01-13 NOTE — Telephone Encounter (Signed)
Pt's daughter is checking to see if [name] is still going to give her the paperwork for [facility] metabolic panel lab work they spoke about a few weeks ago. Please advise

## 2021-01-14 DIAGNOSIS — N189 Chronic kidney disease, unspecified: Secondary | ICD-10-CM | POA: Diagnosis not present

## 2021-01-14 DIAGNOSIS — E1122 Type 2 diabetes mellitus with diabetic chronic kidney disease: Secondary | ICD-10-CM | POA: Diagnosis not present

## 2021-01-14 DIAGNOSIS — I2510 Atherosclerotic heart disease of native coronary artery without angina pectoris: Secondary | ICD-10-CM | POA: Diagnosis not present

## 2021-01-14 DIAGNOSIS — I5032 Chronic diastolic (congestive) heart failure: Secondary | ICD-10-CM | POA: Diagnosis not present

## 2021-01-14 DIAGNOSIS — I130 Hypertensive heart and chronic kidney disease with heart failure and stage 1 through stage 4 chronic kidney disease, or unspecified chronic kidney disease: Secondary | ICD-10-CM | POA: Diagnosis not present

## 2021-01-14 DIAGNOSIS — R350 Frequency of micturition: Secondary | ICD-10-CM | POA: Diagnosis not present

## 2021-01-14 LAB — BASIC METABOLIC PANEL
BUN/Creatinine Ratio: 36 — ABNORMAL HIGH (ref 12–28)
BUN: 58 mg/dL — ABNORMAL HIGH (ref 10–36)
CO2: 20 mmol/L (ref 20–29)
Calcium: 9.5 mg/dL (ref 8.7–10.3)
Chloride: 108 mmol/L — ABNORMAL HIGH (ref 96–106)
Creatinine, Ser: 1.61 mg/dL — ABNORMAL HIGH (ref 0.57–1.00)
Glucose: 98 mg/dL (ref 65–99)
Potassium: 5.1 mmol/L (ref 3.5–5.2)
Sodium: 140 mmol/L (ref 134–144)
eGFR: 29 mL/min/{1.73_m2} — ABNORMAL LOW (ref >59)

## 2021-01-15 DIAGNOSIS — H20011 Primary iridocyclitis, right eye: Secondary | ICD-10-CM | POA: Diagnosis not present

## 2021-01-18 NOTE — Progress Notes (Signed)
[facility] - Clinic Note  01/20/2021     CHIEF COMPLAINT Patient presents for Retina Follow Up   HISTORY OF PRESENT ILLNESS: [name] is a 86 y.o. female who presents to the clinic today for:  HPI    Retina Follow Up    Patient presents with  Other.  In right eye.  This started 5 weeks ago.  I, the attending physician,  performed the HPI with the patient and updated documentation appropriately.          Comments    Patient here for 5 weeks retina follow up for HRVO OD. Patient states vision not doing that well. No eye pain.        Last edited by Rennis Chris, MD on 01/20/2021  9:03 AM. (History)     Pts daughter states it doesn't appear pt could see anything while checking her vision today, pt saw Dr. Dione Booze Friday and he took her off latanoprost, but she is using brim and cosopt, pts daughter states Dr. Dione Booze said she still had a lot of inflammation in her right eye, she goes back to see him in July   Referring physician: Sallye Lat, MD [address] [place],  [place] [address]  HISTORICAL INFORMATION:   Selected notes from the MEDICAL RECORD NUMBER Referred by Dr. Marchelle Gearing for concern of RVO OS   CURRENT MEDICATIONS: Current Outpatient Medications (Ophthalmic Drugs)  Medication Sig  . brimonidine (ALPHAGAN) 0.2 % ophthalmic solution brimonidine 0.2 % eye drops  INSTILL 1 DROP INTO BOTH EYES TWICE A DAY  . dorzolamide-timolol (COSOPT) 22.3-6.8 MG/ML ophthalmic solution Place 1 drop into both eyes 2 (two) times daily.   Marland Kitchen latanoprost (XALATAN) 0.005 % ophthalmic solution latanoprost 0.005 % eye drops  INSTILL 1 DROP INTO RIGHT EYE AT BEDTIME  . Polyethyl Glycol-Propyl Glycol (SYSTANE OP) Apply 1 drop to eye 2 (two) times daily.   No current facility-administered medications for this visit. (Ophthalmic Drugs)   Current Outpatient Medications (Other)  Medication Sig  . Acetaminophen 325 MG CAPS Take 325 mg by mouth 3 (three)  times daily as needed.  Marland Kitchen aspirin 81 MG chewable tablet Chew 81 mg by mouth daily.  Marland Kitchen atorvastatin (LIPITOR) 80 MG tablet Take 1 tablet (80 mg total) by mouth daily at 6 PM.  . Calcium Carbonate-Vitamin D (CALTRATE 600+D PO) Take 1 tablet by mouth 2 (two) times daily.  . cephALEXin (KEFLEX) 250 MG capsule Take 250 mg by mouth at bedtime.  . chlorpheniramine (CHLOR-TRIMETON) 4 MG tablet Take 4 mg by mouth every 4 (four) hours as needed for allergies.  Marland Kitchen denosumab (PROLIA) 60 MG/ML SOSY injection See admin instructions.  . diclofenac sodium (VOLTAREN) 1 % GEL diclofenac 1 % topical gel  AS DIRECTED TWICE A DAY AS NEEDED TRANSDERMAL 14 DAYS  . famotidine (PEPCID) 20 MG tablet Take 20 mg by mouth daily.  Marland Kitchen FLUZONE HIGH-DOSE QUADRIVALENT 0.7 ML SUSY   . furosemide (LASIX) 20 MG tablet Take 20 mg by mouth as needed. Every other day as of 3/17 due to high K  . metoprolol (LOPRESSOR) 50 MG tablet Take 25 mg by mouth 2 (two) times daily.  . Multiple Vitamins-Minerals (MULTIPLE VITAMINS/WOMENS PO) Take 1 tablet by mouth daily. W/vitamin D   No current facility-administered medications for this visit. (Other)      REVIEW OF SYSTEMS: ROS    Positive for: Neurological, Genitourinary, Musculoskeletal, Cardiovascular, Eyes   Negative for: Constitutional, Gastrointestinal,  Skin, HENT, Endocrine, Respiratory, Psychiatric, Allergic/Imm, Heme/Lymph   Last edited by Laddie Aquas, COA on 01/20/2021  8:45 AM. (History)       ALLERGIES Allergies  Allergen Reactions  . Meloxicam Hives  . Other Other (See Comments)  . Guaifenesin Er Rash    Other reaction(s): rash  . Nitrofuran Derivatives Rash    PAST MEDICAL HISTORY Past Medical History:  Diagnosis Date  . Anemia   . Arthritis   . Glaucoma    OU  . Gout, joint   . Hypertension   . Hypertensive retinopathy    OU  . Macular degeneration    OU  . NSTEMI (non-ST elevated myocardial infarction) (HCC) 08/2015  . Stroke [facility])    Past  Surgical History:  Procedure Laterality Date  . ABDOMINAL HYSTERECTOMY    . BREAST SURGERY     CYST  . CARDIAC CATHETERIZATION N/A 08/18/2015   Procedure: Left Heart Cath and Coronary Angiography;  Surgeon: Lennette Bihari, MD;  Location: [facility] INVASIVE CV LAB;  Service: Cardiovascular;  Laterality: N/A;  . CARDIAC CATHETERIZATION  08/18/2015   Procedure: Coronary Stent Intervention;  Surgeon: Lennette Bihari, MD;  Location: [facility] INVASIVE CV LAB;  Service: Cardiovascular;;  . CATARACT EXTRACTION Bilateral   . DILATION AND CURETTAGE OF UTERUS    . EYE SURGERY Bilateral   . HERNIA REPAIR    . IRIDOTOMY / IRIDECTOMY Right   . JOINT REPLACEMENT  left knee  . REFRACTIVE SURGERY Right 2017    FAMILY HISTORY Family History  Problem Relation Age of Onset  . Heart disease Mother   . Stroke Mother     SOCIAL HISTORY Social History   Tobacco Use  . Smoking status: Never Smoker  . Smokeless tobacco: Never Used  Substance Use Topics  . Alcohol use: No    Alcohol/week: 0.0 standard drinks  . Drug use: No         OPHTHALMIC EXAM:  Base Eye Exam    Visual Acuity (Snellen - Linear)      Right Left   Dist cc HM 20/25   Dist ph cc NI 20/25 +2   Correction: Glasses       Tonometry (Tonopen, 8:43 AM)      Right Left   Pressure 10 14       Pupils      Dark Light Shape React APD   Right 3 2 Round Minimal None   Left 3 2 Round Minimal None       Visual Fields (Counting fingers)      Left Right    Full    Restrictions  Total superior nasal deficiency; Partial outer superior temporal, inferior temporal, inferior nasal deficiencies       Extraocular Movement      Right Left    Full Full       Neuro/Psych    Oriented x3: Yes   Mood/Affect: Normal       Dilation    Both eyes: 1.0% Mydriacyl, 2.5% Phenylephrine @ 8:42 AM        Slit Lamp and Fundus Exam    Slit Lamp Exam      Right Left   Lids/Lashes Dermatochalasis - upper lid, mild Meibomian gland dysfunction  Dermatochalasis - upper lid, mild Meibomian gland dysfunction   Conjunctiva/Sclera Trace Injection Trace Injection   Cornea Arcus, 3+ Descemet's folds, well healed temporal cataract wounds Arcus, 1-2+Punctate epithelial erosions, Well healed cataract wounds   Anterior Chamber Deep, 1+cell/pigment  Deep and quiet   Iris Round and poorly dilated Round and moderately dilated to 5mm   Lens PC IOL in good position with open PC PC IOL in good position with open PC   Vitreous Vitreous syneresis, +RBC, diffuse VH -- clearing, vitreous condensations Vitreous syneresis, Posterior vitreous detachment       Fundus Exam      Right Left   Disc Hazy view, White, 3+pallor, +cupping, thin inf rim and temp PPA 1+ Pallor, Sharp rim, +cupping, temporal Peripapillary atrophy   C/D Ratio 0.8 0.6   Macula Hazy view from vitreous opacities -- grossly flat, +edema and SRH Flat, Blunted foveal reflex, RPE mottling and clumping, drusen, No heme or edema, focal cystic changes -- improved   Vessels Vascular attenuation, Tortuous Vascular attenuation, mild Tortuousity   Periphery Hazy view, grossly Attached, obscured by VH and vitreous condensations Attached, No heme         Refraction    Wearing Rx      Sphere Cylinder Axis   Right -1.50 +2.25 173   Left -0.75 +2.25 166          IMAGING AND PROCEDURES  Imaging and Procedures for @TODAY @  OCT, Retina - OU - Both Eyes       Right Eye Quality was poor. Progression has been stable. Findings include subretinal hyper-reflective material, intraretinal fluid, intraretinal hyper-reflective material, epiretinal membrane, no SRF, outer retinal atrophy, abnormal foveal contour (Poor image quality -- retina attached, persistent IRF/edema centrally).   Left Eye Quality was borderline. Central Foveal Thickness: 257. Progression has improved. Findings include normal foveal contour, no SRF, retinal drusen , intraretinal fluid, outer retinal atrophy (Interval improvement of  focal IRF SN fovea -- trace cystic changes remain, patchy ORA).   Notes *Images captured and stored on drive  Diagnosis / Impression:  OD: Poor image quality -- retina attached, persistent IRF/edema centrally OS: NFP, no SRF, +drusen -- Interval improvement of focal IRF SN fovea -- trace cystic changes remain, patchy ORA  Clinical management:  See below  Abbreviations: NFP - Normal foveal profile. CME - cystoid macular edema. PED - pigment epithelial detachment. IRF - intraretinal fluid. SRF - subretinal fluid. EZ - ellipsoid zone. ERM - epiretinal membrane. ORA - outer retinal atrophy. ORT - outer retinal tubulation. SRHM - subretinal hyper-reflective material        Intravitreal Injection, Pharmacologic Agent - OD - Right Eye       Time Out 01/20/2021. 9:46 AM. Confirmed correct patient, procedure, site, and patient consented.   Anesthesia Topical anesthesia was used. Anesthetic medications included Lidocaine 2%, Proparacaine 0.5%.   Procedure Preparation included 5% betadine to ocular surface, eyelid speculum. A supplied (32g) needle was used.   Injection:  1.25 mg Bevacizumab (AVASTIN) 1.25mg /0.7305mL SOLN   NDC: 16109-604-5450242-060-01, Lot: [id number], Expiration date: 02/28/2021   Route: Intravitreal, Site: Right Eye, Waste: 0.05 mL  Post-op Post injection exam found visual acuity of at least counting fingers. The patient tolerated the procedure well. There were no complications. The patient received written and verbal post procedure care education.                 ASSESSMENT/PLAN:    ICD-10-CM   1. Branch retinal vein occlusion of right eye with macular edema  H34.8310 Intravitreal Injection, Pharmacologic Agent - OD - Right Eye    Bevacizumab (AVASTIN) SOLN 1.25 mg  2. Retinal edema  H35.81 OCT, Retina - OU - Both Eyes  3. Vitreous hemorrhage of right  eye (HCC)  H43.11   4. Exudative age-related macular degeneration of right eye with active choroidal neovascularization  (HCC)  H35.3211   5. Intermediate stage nonexudative age-related macular degeneration of left eye  H35.3122   6. Essential hypertension  I10   7. Hypertensive retinopathy of both eyes  H35.033   8. Primary open angle glaucoma (POAG) of both eyes, severe stage  H40.1133   9. Pseudophakia of both eyes  Z96.1   10. Dry eyes, bilateral  H04.123     1-3. Inferior HRVO w/ CME and VH, OD  - pt lost to f/u from 10/16/2019 to 07/14/2020 (9 mos)  - presenting BCVA CF 2' (09.14.20)  - pt subjectively reports decline in vision OD since Feb 2020  - presenting OCT shows severe CME/IRF temporal macula, +SRF overlying low PED (?exudative ARMD component)  - S/p IVA OD #1 09.14.20, #2 (10.20.20), #3 (12.02.20), #4 (01.06.21), #5 (10.05.21), #6 (11.03.21), #7 (12.01.21), #8 (12.29.22)  - OCT -- Poor image quality due to old VH / vit condensations -- persistent CME inferior macula  - discussed findings, poor prognosis and treatment options  - recommend IVA OD #9 today (4.13.22) for chronic VH and persistent IRF -- pt and daughter in agreement  - RBA of procedure discussed, questions answered  - informed consent obtained and signed  - see procedure note  - f/u 4 weeks, DFE, OCT, possible injection  4. Exudative age related macular degeneration, OD    - initial OCT showed +IRF/SRF -- SRF overlying low lying PED  - original exam shows +CNVM with surrounding heme -- posterior exam impeded by old VH  - suspect exudative ARMD component complicating HRVO w/ CME  - S/p IVA OD #1 (09.14.20), #2 (10.20.20), #3 (12.02.20), #4 (01.06.21), #5 (10.05.21), #6 (11.03.21), #7 (12.01.21), #8 (12.29.22)  - recommend IVA OD #9 today as above  - f/u in 4 weeks -- DFE/OCT, possible injection  5. Age related macular degeneration, non-exudative, OS  - OCT shows interval improvement of focal cystic changes,  - no heme or edema visible on exam  - recommend close observation  - cont Amsler grid monitoring  - f/u 4 weeks  6,7.  Hypertensive retinopathy OU  - discussed importance of tight BP control  - monitor  8. POAG OU -- severe stage  - under the expert management of Dr. Marchelle Gearing  - s/p SLT OD 3.23.2017  - s/p TDC OD w/ Dr. Loraine Grip, 3.30.21  - IOP good today at 10,14  - pt on latanoprost qhs OD, dorzolamide/timilol bid OU, and brimonidine bid OU -- d/c latanoprost per Dr. Dione Booze  9. Pseudophakia OU  - s/p CE/IOL OU  - beautiful surgeries, doing well  - monitor  10. Dry eyes OU (OD > OS)  - recommend artificial tears and lubricating ointment as needed   Ophthalmic Meds Ordered this visit:  Meds ordered this encounter  Medications  . Bevacizumab (AVASTIN) SOLN 1.25 mg       Return in about 4 weeks (around 02/17/2021) for f/u HRVO OD, DFE, OCT.  There are no Patient Instructions on file for this visit.   Explained the diagnoses, plan, and follow up with the patient and they expressed understanding.  Patient expressed understanding of the importance of proper follow up care.   This document serves as a record of services personally performed by Karie Chimera, MD, PhD. It was created on their behalf by [name], COMT. The creation of this record is the provider's  dictation and/or activities during the visit.  Electronically signed by: [name], COMT 01/20/21 1:32 PM   This document serves as a record of services personally performed by Karie Chimera, MD, PhD. It was created on their behalf by Glee Arvin. Manson Passey, OA an ophthalmic technician. The creation of this record is the provider's dictation and/or activities during the visit.    Electronically signed by: Glee Arvin. Manson Passey, [place] 04.13.2022 1:32 PM  Karie Chimera, M.D., Ph.D. Diseases & Surgery of the Retina and Vitreous [facility]  I have reviewed the above documentation for accuracy and completeness, and I agree with the above. Karie Chimera, M.D., Ph.D. 01/20/21 1:32 PM  Abbreviations: M myopia  (nearsighted); A astigmatism; H hyperopia (farsighted); P presbyopia; Mrx spectacle prescription;  CTL contact lenses; OD right eye; OS left eye; OU both eyes  XT exotropia; ET esotropia; PEK punctate epithelial keratitis; PEE punctate epithelial erosions; DES dry eye syndrome; MGD meibomian gland dysfunction; ATs artificial tears; PFAT's preservative free artificial tears; NSC nuclear sclerotic cataract; PSC posterior subcapsular cataract; ERM epi-retinal membrane; PVD posterior vitreous detachment; RD retinal detachment; DM diabetes mellitus; DR diabetic retinopathy; NPDR non-proliferative diabetic retinopathy; PDR proliferative diabetic retinopathy; CSME clinically significant macular edema; DME diabetic macular edema; dbh dot blot hemorrhages; CWS cotton wool spot; POAG primary open angle glaucoma; C/D cup-to-disc ratio; HVF humphrey visual field; GVF goldmann visual field; OCT optical coherence tomography; IOP intraocular pressure; BRVO Branch retinal vein occlusion; CRVO central retinal vein occlusion; CRAO central retinal artery occlusion; BRAO branch retinal artery occlusion; RT retinal tear; SB scleral buckle; PPV pars plana vitrectomy; VH Vitreous hemorrhage; PRP panretinal laser photocoagulation; IVK intravitreal kenalog; VMT vitreomacular traction; MH Macular hole;  NVD neovascularization of the disc; NVE neovascularization elsewhere; AREDS age related eye disease study; ARMD age related macular degeneration; POAG primary open angle glaucoma; EBMD epithelial/anterior basement membrane dystrophy; ACIOL anterior chamber intraocular lens; IOL intraocular lens; PCIOL posterior chamber intraocular lens; Phaco/IOL phacoemulsification with intraocular lens placement; PRK photorefractive keratectomy; LASIK laser assisted in situ keratomileusis; HTN hypertension; DM diabetes mellitus; COPD chronic obstructive pulmonary disease

## 2021-01-19 DIAGNOSIS — I272 Pulmonary hypertension, unspecified: Secondary | ICD-10-CM | POA: Diagnosis not present

## 2021-01-19 DIAGNOSIS — Z87440 Personal history of urinary (tract) infections: Secondary | ICD-10-CM | POA: Diagnosis not present

## 2021-01-19 DIAGNOSIS — M1990 Unspecified osteoarthritis, unspecified site: Secondary | ICD-10-CM | POA: Diagnosis not present

## 2021-01-19 DIAGNOSIS — Z8673 Personal history of transient ischemic attack (TIA), and cerebral infarction without residual deficits: Secondary | ICD-10-CM | POA: Diagnosis not present

## 2021-01-19 DIAGNOSIS — E1122 Type 2 diabetes mellitus with diabetic chronic kidney disease: Secondary | ICD-10-CM | POA: Diagnosis not present

## 2021-01-19 DIAGNOSIS — I5032 Chronic diastolic (congestive) heart failure: Secondary | ICD-10-CM | POA: Diagnosis not present

## 2021-01-19 DIAGNOSIS — I2510 Atherosclerotic heart disease of native coronary artery without angina pectoris: Secondary | ICD-10-CM | POA: Diagnosis not present

## 2021-01-19 DIAGNOSIS — R296 Repeated falls: Secondary | ICD-10-CM | POA: Diagnosis not present

## 2021-01-19 DIAGNOSIS — I130 Hypertensive heart and chronic kidney disease with heart failure and stage 1 through stage 4 chronic kidney disease, or unspecified chronic kidney disease: Secondary | ICD-10-CM | POA: Diagnosis not present

## 2021-01-19 DIAGNOSIS — N189 Chronic kidney disease, unspecified: Secondary | ICD-10-CM | POA: Diagnosis not present

## 2021-01-20 ENCOUNTER — Ambulatory Visit (INDEPENDENT_AMBULATORY_CARE_PROVIDER_SITE_OTHER): Payer: Medicare Other | Admitting: Ophthalmology

## 2021-01-20 ENCOUNTER — Encounter (INDEPENDENT_AMBULATORY_CARE_PROVIDER_SITE_OTHER): Payer: Self-pay | Admitting: Ophthalmology

## 2021-01-20 ENCOUNTER — Other Ambulatory Visit: Payer: Self-pay

## 2021-01-20 DIAGNOSIS — H34831 Tributary (branch) retinal vein occlusion, right eye, with macular edema: Secondary | ICD-10-CM | POA: Diagnosis not present

## 2021-01-20 DIAGNOSIS — H353122 Nonexudative age-related macular degeneration, left eye, intermediate dry stage: Secondary | ICD-10-CM

## 2021-01-20 DIAGNOSIS — H353211 Exudative age-related macular degeneration, right eye, with active choroidal neovascularization: Secondary | ICD-10-CM

## 2021-01-20 DIAGNOSIS — H35033 Hypertensive retinopathy, bilateral: Secondary | ICD-10-CM

## 2021-01-20 DIAGNOSIS — H401133 Primary open-angle glaucoma, bilateral, severe stage: Secondary | ICD-10-CM

## 2021-01-20 DIAGNOSIS — H4311 Vitreous hemorrhage, right eye: Secondary | ICD-10-CM | POA: Diagnosis not present

## 2021-01-20 DIAGNOSIS — H04123 Dry eye syndrome of bilateral lacrimal glands: Secondary | ICD-10-CM | POA: Diagnosis not present

## 2021-01-20 DIAGNOSIS — I10 Essential (primary) hypertension: Secondary | ICD-10-CM

## 2021-01-20 DIAGNOSIS — H3581 Retinal edema: Secondary | ICD-10-CM | POA: Diagnosis not present

## 2021-01-20 DIAGNOSIS — Z961 Presence of intraocular lens: Secondary | ICD-10-CM

## 2021-01-20 MED ORDER — BEVACIZUMAB CHEMO INJECTION 1.25MG/0.05ML SYRINGE FOR KALEIDOSCOPE
1.2500 mg | INTRAVITREAL | Status: AC | PRN
  Administered 2021-01-20: 1.25 mg via INTRAVITREAL

## 2021-01-28 ENCOUNTER — Ambulatory Visit: Payer: Medicare Other | Admitting: Podiatry

## 2021-01-28 ENCOUNTER — Other Ambulatory Visit: Payer: Self-pay

## 2021-01-28 DIAGNOSIS — E1142 Type 2 diabetes mellitus with diabetic polyneuropathy: Secondary | ICD-10-CM

## 2021-01-28 DIAGNOSIS — L84 Corns and callosities: Secondary | ICD-10-CM | POA: Diagnosis not present

## 2021-01-28 DIAGNOSIS — M2041 Other hammer toe(s) (acquired), right foot: Secondary | ICD-10-CM

## 2021-01-28 DIAGNOSIS — M2042 Other hammer toe(s) (acquired), left foot: Secondary | ICD-10-CM | POA: Diagnosis not present

## 2021-01-28 NOTE — Patient Instructions (Signed)
Look for urea 40% cream or ointment and apply to the thickened dry skin / calluses. This can be bought over the counter, at a pharmacy or online such as [employer].    If wound open: Apply iodosorb (brown paste) and bandage and wear the offloading boot   If wound is closed:  Apply the urea cream and wear the orthopedic shoes and inserts   Bring the shoes and inserts to the next appointment so I can inspect and adjust them

## 2021-01-29 DIAGNOSIS — Z8673 Personal history of transient ischemic attack (TIA), and cerebral infarction without residual deficits: Secondary | ICD-10-CM | POA: Diagnosis not present

## 2021-01-29 DIAGNOSIS — E1122 Type 2 diabetes mellitus with diabetic chronic kidney disease: Secondary | ICD-10-CM | POA: Diagnosis not present

## 2021-01-29 DIAGNOSIS — I272 Pulmonary hypertension, unspecified: Secondary | ICD-10-CM | POA: Diagnosis not present

## 2021-01-29 DIAGNOSIS — M1990 Unspecified osteoarthritis, unspecified site: Secondary | ICD-10-CM | POA: Diagnosis not present

## 2021-01-29 DIAGNOSIS — Z87440 Personal history of urinary (tract) infections: Secondary | ICD-10-CM | POA: Diagnosis not present

## 2021-01-29 DIAGNOSIS — R296 Repeated falls: Secondary | ICD-10-CM | POA: Diagnosis not present

## 2021-01-29 DIAGNOSIS — I5032 Chronic diastolic (congestive) heart failure: Secondary | ICD-10-CM | POA: Diagnosis not present

## 2021-01-29 DIAGNOSIS — I130 Hypertensive heart and chronic kidney disease with heart failure and stage 1 through stage 4 chronic kidney disease, or unspecified chronic kidney disease: Secondary | ICD-10-CM | POA: Diagnosis not present

## 2021-01-29 DIAGNOSIS — N189 Chronic kidney disease, unspecified: Secondary | ICD-10-CM | POA: Diagnosis not present

## 2021-01-29 DIAGNOSIS — I2510 Atherosclerotic heart disease of native coronary artery without angina pectoris: Secondary | ICD-10-CM | POA: Diagnosis not present

## 2021-01-31 NOTE — Progress Notes (Signed)
  Subjective:  Patient ID: [name], female    DOB: 1923-10-09,  MRN: [id number]  Chief Complaint  Patient presents with  . Wound Check    3 week f/u right foot ulcer     87 y.o. female presents with the above complaint. History confirmed with patient.  Says there has been intermittent discharge.  Very painful.  Objective:  Physical Exam: warm, good capillary refill, no trophic changes or ulcerative lesions, normal DP and PT pulses and normal sensory exam.  Right Foot: Submetatarsal 3 and 4 ulceration has healed with simple overlying keratosis Assessment:   1. Diabetic peripheral neuropathy associated with type 2 diabetes mellitus (HCC)   2. Acquired hammertoes of both feet   3. Callus of foot      Plan:  Patient was evaluated and treated and all questions answered.  Ulcer right foot -Appears to have healed at this point.  I discussed with her granddaughter that she should return to her multi density orthopedic shoes and apply softening agent such as urea cream when the skin has healed.  If it returns then we will resume using the offloading surgical shoe and Iodosorb ointment.  Would like to recheck in 1 month to make sure it has not recurred  No follow-ups on file.

## 2021-02-02 DIAGNOSIS — M1990 Unspecified osteoarthritis, unspecified site: Secondary | ICD-10-CM | POA: Diagnosis not present

## 2021-02-02 DIAGNOSIS — N189 Chronic kidney disease, unspecified: Secondary | ICD-10-CM | POA: Diagnosis not present

## 2021-02-02 DIAGNOSIS — Z8673 Personal history of transient ischemic attack (TIA), and cerebral infarction without residual deficits: Secondary | ICD-10-CM | POA: Diagnosis not present

## 2021-02-02 DIAGNOSIS — I272 Pulmonary hypertension, unspecified: Secondary | ICD-10-CM | POA: Diagnosis not present

## 2021-02-02 DIAGNOSIS — I2510 Atherosclerotic heart disease of native coronary artery without angina pectoris: Secondary | ICD-10-CM | POA: Diagnosis not present

## 2021-02-02 DIAGNOSIS — R296 Repeated falls: Secondary | ICD-10-CM | POA: Diagnosis not present

## 2021-02-02 DIAGNOSIS — Z87440 Personal history of urinary (tract) infections: Secondary | ICD-10-CM | POA: Diagnosis not present

## 2021-02-02 DIAGNOSIS — I130 Hypertensive heart and chronic kidney disease with heart failure and stage 1 through stage 4 chronic kidney disease, or unspecified chronic kidney disease: Secondary | ICD-10-CM | POA: Diagnosis not present

## 2021-02-02 DIAGNOSIS — E1122 Type 2 diabetes mellitus with diabetic chronic kidney disease: Secondary | ICD-10-CM | POA: Diagnosis not present

## 2021-02-02 DIAGNOSIS — I5032 Chronic diastolic (congestive) heart failure: Secondary | ICD-10-CM | POA: Diagnosis not present

## 2021-02-05 ENCOUNTER — Ambulatory Visit (INDEPENDENT_AMBULATORY_CARE_PROVIDER_SITE_OTHER): Payer: Medicare Other

## 2021-02-05 ENCOUNTER — Other Ambulatory Visit: Payer: Self-pay

## 2021-02-05 DIAGNOSIS — E1142 Type 2 diabetes mellitus with diabetic polyneuropathy: Secondary | ICD-10-CM

## 2021-02-09 DIAGNOSIS — I130 Hypertensive heart and chronic kidney disease with heart failure and stage 1 through stage 4 chronic kidney disease, or unspecified chronic kidney disease: Secondary | ICD-10-CM | POA: Diagnosis not present

## 2021-02-09 DIAGNOSIS — I272 Pulmonary hypertension, unspecified: Secondary | ICD-10-CM | POA: Diagnosis not present

## 2021-02-09 DIAGNOSIS — N189 Chronic kidney disease, unspecified: Secondary | ICD-10-CM | POA: Diagnosis not present

## 2021-02-09 DIAGNOSIS — I5032 Chronic diastolic (congestive) heart failure: Secondary | ICD-10-CM | POA: Diagnosis not present

## 2021-02-09 DIAGNOSIS — Z8673 Personal history of transient ischemic attack (TIA), and cerebral infarction without residual deficits: Secondary | ICD-10-CM | POA: Diagnosis not present

## 2021-02-09 DIAGNOSIS — Z87440 Personal history of urinary (tract) infections: Secondary | ICD-10-CM | POA: Diagnosis not present

## 2021-02-09 DIAGNOSIS — M1990 Unspecified osteoarthritis, unspecified site: Secondary | ICD-10-CM | POA: Diagnosis not present

## 2021-02-09 DIAGNOSIS — E1122 Type 2 diabetes mellitus with diabetic chronic kidney disease: Secondary | ICD-10-CM | POA: Diagnosis not present

## 2021-02-09 DIAGNOSIS — I2510 Atherosclerotic heart disease of native coronary artery without angina pectoris: Secondary | ICD-10-CM | POA: Diagnosis not present

## 2021-02-09 DIAGNOSIS — R296 Repeated falls: Secondary | ICD-10-CM | POA: Diagnosis not present

## 2021-02-11 DIAGNOSIS — R350 Frequency of micturition: Secondary | ICD-10-CM | POA: Diagnosis not present

## 2021-02-15 DIAGNOSIS — Z8673 Personal history of transient ischemic attack (TIA), and cerebral infarction without residual deficits: Secondary | ICD-10-CM | POA: Diagnosis not present

## 2021-02-15 DIAGNOSIS — M1990 Unspecified osteoarthritis, unspecified site: Secondary | ICD-10-CM | POA: Diagnosis not present

## 2021-02-15 DIAGNOSIS — I2510 Atherosclerotic heart disease of native coronary artery without angina pectoris: Secondary | ICD-10-CM | POA: Diagnosis not present

## 2021-02-15 DIAGNOSIS — Z87440 Personal history of urinary (tract) infections: Secondary | ICD-10-CM | POA: Diagnosis not present

## 2021-02-15 DIAGNOSIS — I272 Pulmonary hypertension, unspecified: Secondary | ICD-10-CM | POA: Diagnosis not present

## 2021-02-15 DIAGNOSIS — I130 Hypertensive heart and chronic kidney disease with heart failure and stage 1 through stage 4 chronic kidney disease, or unspecified chronic kidney disease: Secondary | ICD-10-CM | POA: Diagnosis not present

## 2021-02-15 DIAGNOSIS — I5032 Chronic diastolic (congestive) heart failure: Secondary | ICD-10-CM | POA: Diagnosis not present

## 2021-02-15 DIAGNOSIS — R296 Repeated falls: Secondary | ICD-10-CM | POA: Diagnosis not present

## 2021-02-15 DIAGNOSIS — E1122 Type 2 diabetes mellitus with diabetic chronic kidney disease: Secondary | ICD-10-CM | POA: Diagnosis not present

## 2021-02-15 DIAGNOSIS — N189 Chronic kidney disease, unspecified: Secondary | ICD-10-CM | POA: Diagnosis not present

## 2021-02-16 DIAGNOSIS — E1165 Type 2 diabetes mellitus with hyperglycemia: Secondary | ICD-10-CM | POA: Diagnosis not present

## 2021-02-16 DIAGNOSIS — E559 Vitamin D deficiency, unspecified: Secondary | ICD-10-CM | POA: Diagnosis not present

## 2021-02-16 DIAGNOSIS — Z0000 Encounter for general adult medical examination without abnormal findings: Secondary | ICD-10-CM | POA: Diagnosis not present

## 2021-02-16 NOTE — Progress Notes (Signed)
[facility] - Clinic Note  02/17/2021     CHIEF COMPLAINT Patient presents for Retina Follow Up   HISTORY OF PRESENT ILLNESS: [name] is a 87 y.o. female who presents to the clinic today for:  HPI    Retina Follow Up    Patient presents with  CRVO/BRVO.  In right eye.  This started 4 weeks ago.  Since onset it is stable.  I, the attending physician,  performed the HPI with the patient and updated documentation appropriately.          Comments    Pt here for 4 week retinal follow up exu HRVO OD. Pt states no changes vision wise, no ocular pain or discomfort.        Last edited by Rennis Chris, MD on 02/17/2021  9:08 AM. (History)      Referring physician: [name], MD [address] [place],  [place] [address]  HISTORICAL INFORMATION:   Selected notes from the MEDICAL RECORD NUMBER Referred by Dr. Marchelle Gearing for concern of RVO OS   CURRENT MEDICATIONS: Current Outpatient Medications (Ophthalmic Drugs)  Medication Sig  . brimonidine (ALPHAGAN) 0.2 % ophthalmic solution brimonidine 0.2 % eye drops  INSTILL 1 DROP INTO BOTH EYES TWICE A DAY  . dorzolamide-timolol (COSOPT) 22.3-6.8 MG/ML ophthalmic solution Place 1 drop into both eyes 2 (two) times daily.   Marland Kitchen latanoprost (XALATAN) 0.005 % ophthalmic solution latanoprost 0.005 % eye drops  INSTILL 1 DROP INTO RIGHT EYE AT BEDTIME  . Polyethyl Glycol-Propyl Glycol (SYSTANE OP) Apply 1 drop to eye 2 (two) times daily.   No current facility-administered medications for this visit. (Ophthalmic Drugs)   Current Outpatient Medications (Other)  Medication Sig  . Acetaminophen 325 MG CAPS Take 325 mg by mouth 3 (three) times daily as needed.  Marland Kitchen aspirin 81 MG chewable tablet Chew 81 mg by mouth daily.  Marland Kitchen atorvastatin (LIPITOR) 80 MG tablet Take 1 tablet (80 mg total) by mouth daily at 6 PM.  . Calcium Carbonate-Vitamin D (CALTRATE 600+D PO) Take 1 tablet by mouth 2 (two) times daily.  .  cephALEXin (KEFLEX) 250 MG capsule Take 250 mg by mouth at bedtime.  . chlorpheniramine (CHLOR-TRIMETON) 4 MG tablet Take 4 mg by mouth every 4 (four) hours as needed for allergies.  Marland Kitchen denosumab (PROLIA) 60 MG/ML SOSY injection See admin instructions.  . diclofenac sodium (VOLTAREN) 1 % GEL diclofenac 1 % topical gel  AS DIRECTED TWICE A DAY AS NEEDED TRANSDERMAL 14 DAYS  . famotidine (PEPCID) 20 MG tablet Take 20 mg by mouth daily.  Marland Kitchen FLUZONE HIGH-DOSE QUADRIVALENT 0.7 ML SUSY   . furosemide (LASIX) 20 MG tablet Take 20 mg by mouth as needed. Every other day as of 3/17 due to high K  . metoprolol (LOPRESSOR) 50 MG tablet Take 25 mg by mouth 2 (two) times daily.  . Multiple Vitamins-Minerals (MULTIPLE VITAMINS/WOMENS PO) Take 1 tablet by mouth daily. W/vitamin D   No current facility-administered medications for this visit. (Other)      REVIEW OF SYSTEMS:    ALLERGIES Allergies  Allergen Reactions  . Meloxicam Hives  . Other Other (See Comments)  . Guaifenesin Er Rash    Other reaction(s): rash  . Nitrofuran Derivatives Rash    PAST MEDICAL HISTORY Past Medical History:  Diagnosis Date  . Anemia   . Arthritis   . Glaucoma    OU  . Gout, joint   . Hypertension   .  Hypertensive retinopathy    OU  . Macular degeneration    OU  . NSTEMI (non-ST elevated myocardial infarction) (HCC) 08/2015  . Stroke [facility])    Past Surgical History:  Procedure Laterality Date  . ABDOMINAL HYSTERECTOMY    . BREAST SURGERY     CYST  . CARDIAC CATHETERIZATION N/A 08/18/2015   Procedure: Left Heart Cath and Coronary Angiography;  Surgeon: Lennette Bihari, MD;  Location: [facility] INVASIVE CV LAB;  Service: Cardiovascular;  Laterality: N/A;  . CARDIAC CATHETERIZATION  08/18/2015   Procedure: Coronary Stent Intervention;  Surgeon: Lennette Bihari, MD;  Location: [facility] INVASIVE CV LAB;  Service: Cardiovascular;;  . CATARACT EXTRACTION Bilateral   . DILATION AND CURETTAGE OF UTERUS    . EYE SURGERY  Bilateral   . HERNIA REPAIR    . IRIDOTOMY / IRIDECTOMY Right   . JOINT REPLACEMENT  left knee  . REFRACTIVE SURGERY Right 2017    FAMILY HISTORY Family History  Problem Relation Age of Onset  . Heart disease Mother   . Stroke Mother     SOCIAL HISTORY Social History   Tobacco Use  . Smoking status: Never Smoker  . Smokeless tobacco: Never Used  Substance Use Topics  . Alcohol use: No    Alcohol/week: 0.0 standard drinks  . Drug use: No         OPHTHALMIC EXAM:  Base Eye Exam    Visual Acuity (Snellen - Linear)      Right Left   Dist cc HM 20/25 +2   Correction: Glasses       Tonometry (Tonopen, 8:42 AM)      Right Left   Pressure 10 18       Pupils      Dark Light Shape React APD   Right 3 2 Round Minimal None   Left 3 2 Round Minimal None       Visual Fields (Counting fingers)      Left Right    Full    Restrictions  Total superior nasal deficiency; Partial outer superior temporal, inferior temporal, inferior nasal deficiencies       Extraocular Movement      Right Left    Full, Ortho Full, Ortho       Neuro/Psych    Oriented x3: Yes   Mood/Affect: Normal       Dilation    Both eyes: 1.0% Mydriacyl, 2.5% Phenylephrine @ 8:43 AM        Slit Lamp and Fundus Exam    Slit Lamp Exam      Right Left   Lids/Lashes Dermatochalasis - upper lid, mild Meibomian gland dysfunction Dermatochalasis - upper lid, mild Meibomian gland dysfunction   Conjunctiva/Sclera Trace Injection Trace Injection   Cornea Arcus, 3+ Descemet's folds, well healed temporal cataract wounds Arcus, 1-2+Punctate epithelial erosions, Well healed cataract wounds   Anterior Chamber Deep, 1+cell/pigment Deep and quiet   Iris Round and poorly dilated Round and moderately dilated to 5mm   Lens PC IOL in good position with open PC PC IOL in good position with open PC   Vitreous Vitreous syneresis, +RBC, diffuse VH -- clearing, vitreous condensations Vitreous syneresis, Posterior  vitreous detachment       Fundus Exam      Right Left   Disc Hazy view, White, 3+pallor, +cupping, thin inf rim and temp PPA 1+ Pallor, Sharp rim, +cupping, temporal Peripapillary atrophy   C/D Ratio 0.8 0.6   Macula Hazy view from vitreous  opacities -- grossly flat, +edema, central pigment clump, mild fibrosis Flat, good foveal reflex, RPE mottling and clumping, drusen, No heme or edema, focal cystic changes -- improved   Vessels attenuated, Tortuous Vascular attenuation, mild Tortuousity   Periphery Hazy view, grossly Attached, obscured by [facility] and vitreous condensations Attached, No heme         Refraction    Wearing Rx      Sphere Cylinder Axis   Right -1.50 +2.25 173   Left -0.75 +2.25 166          IMAGING AND PROCEDURES  Imaging and Procedures for @TODAY @  OCT, Retina - OU - Both Eyes       Right Eye Quality was poor. Progression has improved. Findings include subretinal hyper-reflective material, intraretinal fluid, intraretinal hyper-reflective material, epiretinal membrane, no SRF, outer retinal atrophy, abnormal foveal contour (Poor image quality -- retina attached, mild interval improvement in vitreous opacities, persistent central edema).   Left Eye Quality was borderline. Central Foveal Thickness: 256. Progression has been stable. Findings include normal foveal contour, no SRF, retinal drusen , outer retinal atrophy, no IRF (stable improvement of focal IRF SN fovea -- trace cystic changes remain, patchy ORA).   Notes *Images captured and stored on drive  Diagnosis / Impression:  OD: Poor image quality -- retina attached, mild interval improvement in vitreous opacities, persistent central edema OS: NFP, no SRF, +drusen -- stable improvement of focal IRF SN fovea -- trace cystic changes remain, patchy ORA  Clinical management:  See below  Abbreviations: NFP - Normal foveal profile. CME - cystoid macular edema. PED - pigment epithelial detachment. IRF -  intraretinal fluid. SRF - subretinal fluid. EZ - ellipsoid zone. ERM - epiretinal membrane. ORA - outer retinal atrophy. ORT - outer retinal tubulation. SRHM - subretinal hyper-reflective material        Intravitreal Injection, Pharmacologic Agent - OD - Right Eye       Time Out 02/17/2021. 9:24 AM. Confirmed correct patient, procedure, site, and patient consented.   Anesthesia Topical anesthesia was used. Anesthetic medications included Lidocaine 2%, Proparacaine 0.5%.   Procedure Preparation included 5% betadine to ocular surface, eyelid speculum. A supplied needle was used.   Injection:  1.25 mg Bevacizumab (AVASTIN) 1.25mg /0.93mL SOLN   NDC: 0m, Lot: [id number] , Expiration date: 03/22/2021   Route: Intravitreal, Site: Right Eye, Waste: 0 mL  Post-op Post injection exam found visual acuity of at least counting fingers. The patient tolerated the procedure well. There were no complications. The patient received written and verbal post procedure care education. Post injection medications were not given.                 ASSESSMENT/PLAN:    ICD-10-CM   1. Branch retinal vein occlusion of right eye with macular edema  H34.8310 Intravitreal Injection, Pharmacologic Agent - OD - Right Eye    Bevacizumab (AVASTIN) SOLN 1.25 mg  2. Retinal edema  H35.81 OCT, Retina - OU - Both Eyes  3. Vitreous hemorrhage of right eye (HCC)  H43.11   4. Exudative age-related macular degeneration of right eye with active choroidal neovascularization (HCC)  H35.3211   5. Intermediate stage nonexudative age-related macular degeneration of left eye  H35.3122   6. Essential hypertension  I10   7. Hypertensive retinopathy of both eyes  H35.033   8. Primary open angle glaucoma (POAG) of both eyes, severe stage  H40.1133   9. Pseudophakia of both eyes  Z96.1   10. Dry eyes, bilateral  V78.469H04.123     1-3. Inferior HRVO w/ CME and VH, OD  - pt lost to f/u from 10/16/2019 to 07/14/2020 (9 mos)  -  presenting BCVA CF 2' (09.14.20)  - pt subjectively reports decline in vision OD since Feb 2020  - presenting OCT shows severe CME/IRF temporal macula, +SRF overlying low PED (?exudative ARMD component)  - S/p IVA OD #1 09.14.20, #2 (10.20.20), #3 (12.02.20), #4 (01.06.21), #5 (10.05.21), #6 (11.03.21), #7 (12.01.21), #8 (12.29.22), #9 (04.13.22)  - OCT -- Poor image quality due to old VH / vit condensations -- mild interval improvement in vitreous opacities, persistent central edema  - discussed findings, poor prognosis and treatment options  - recommend IVA OD #10 today (5.11.22) for chronic VH and persistent IRF -- pt and daughter in agreement  - RBA of procedure discussed, questions answered  - informed consent obtained and signed  - see procedure note  - f/u 5 weeks, DFE, OCT, possible injection  4. Exudative age related macular degeneration, OD    - initial OCT showed +IRF/SRF -- SRF overlying low lying PED  - original exam shows +CNVM with surrounding heme -- posterior exam impeded by old VH  - suspect exudative ARMD component complicating HRVO w/ CME  - S/p IVA OD #1 (09.14.20), #2 (10.20.20), #3 (12.02.20), #4 (01.06.21), #5 (10.05.21), #6 (11.03.21), #7 (12.01.21), #8 (12.29.22), #9 (04.13.22)  - recommend IVA OD #10 today as above  - f/u in 5 weeks -- DFE/OCT, possible injection  5. Age related macular degeneration, non-exudative, OS  - OCT shows stable improvement of focal cystic changes  - no heme or edema visible on exam  - recommend close observation  - cont Amsler grid monitoring  - f/u 5 weeks  6,7. Hypertensive retinopathy OU  - discussed importance of tight BP control  - monitor  8. POAG OU -- severe stage  - under the expert management of Dr. Marchelle Gearinghris Groat  - s/p SLT OD 3.23.2017  - s/p TDC OD w/ Dr. Loraine GripMoya, 3.30.21  - IOP good today at 10,18  - patient on cosopt bid OU, brimonidine bid OU---patient d/c'd latanoprost qhs OD per Dr. Dione BoozeGroat  9. Pseudophakia OU  -  s/p CE/IOL OU  - beautiful surgeries, doing well  - monitor  10. Dry eyes OU (OD > OS)  - recommend artificial tears and lubricating ointment as needed   Ophthalmic Meds Ordered this visit:  Meds ordered this encounter  Medications  . Bevacizumab (AVASTIN) SOLN 1.25 mg       Return in about 5 weeks (around 03/24/2021) for f/u HRVO OD, DFE, OCT.  There are no Patient Instructions on file for this visit.   Explained the diagnoses, plan, and follow up with the patient and they expressed understanding.  Patient expressed understanding of the importance of proper follow up care.   This document serves as a record of services personally performed by Karie ChimeraBrian G. Zella Dewan, MD, PhD. It was created on their behalf by Annalee Gentaaryl Barber, COMT. The creation of this record is the provider's dictation and/or activities during the visit.  Electronically signed by: Annalee Gentaaryl Barber, COMT 02/17/21 9:46 AM   This document serves as a record of services personally performed by Karie ChimeraBrian G. Kaitlinn Iversen, MD, PhD. It was created on their behalf by Glee ArvinAmanda J. Manson PasseyBrown, OA an ophthalmic technician. The creation of this record is the provider's dictation and/or activities during the visit.    Electronically signed by: Glee ArvinAmanda J. Manson PasseyBrown, [place] 05.11.2022 9:46 AM  Arlys JohnBrian  Wynona Neat, M.D., Ph.D. Diseases & Surgery of the Retina and Vitreous [facility]  I have reviewed the above documentation for accuracy and completeness, and I agree with the above. Karie Chimera, M.D., Ph.D. 02/17/21 9:46 AM   Abbreviations: M myopia (nearsighted); A astigmatism; H hyperopia (farsighted); P presbyopia; Mrx spectacle prescription;  CTL contact lenses; OD right eye; OS left eye; OU both eyes  XT exotropia; ET esotropia; PEK punctate epithelial keratitis; PEE punctate epithelial erosions; DES dry eye syndrome; MGD meibomian gland dysfunction; ATs artificial tears; PFAT's preservative free artificial tears; NSC nuclear sclerotic  cataract; PSC posterior subcapsular cataract; ERM epi-retinal membrane; PVD posterior vitreous detachment; RD retinal detachment; DM diabetes mellitus; DR diabetic retinopathy; NPDR non-proliferative diabetic retinopathy; PDR proliferative diabetic retinopathy; CSME clinically significant macular edema; DME diabetic macular edema; dbh dot blot hemorrhages; CWS cotton wool spot; POAG primary open angle glaucoma; C/D cup-to-disc ratio; HVF humphrey visual field; GVF goldmann visual field; OCT optical coherence tomography; IOP intraocular pressure; BRVO Branch retinal vein occlusion; CRVO central retinal vein occlusion; CRAO central retinal artery occlusion; BRAO branch retinal artery occlusion; RT retinal tear; SB scleral buckle; PPV pars plana vitrectomy; VH Vitreous hemorrhage; PRP panretinal laser photocoagulation; IVK intravitreal kenalog; VMT vitreomacular traction; MH Macular hole;  NVD neovascularization of the disc; NVE neovascularization elsewhere; AREDS age related eye disease study; ARMD age related macular degeneration; POAG primary open angle glaucoma; EBMD epithelial/anterior basement membrane dystrophy; ACIOL anterior chamber intraocular lens; IOL intraocular lens; PCIOL posterior chamber intraocular lens; Phaco/IOL phacoemulsification with intraocular lens placement; PRK photorefractive keratectomy; LASIK laser assisted in situ keratomileusis; HTN hypertension; DM diabetes mellitus; COPD chronic obstructive pulmonary disease

## 2021-02-17 ENCOUNTER — Ambulatory Visit (INDEPENDENT_AMBULATORY_CARE_PROVIDER_SITE_OTHER): Payer: Medicare Other | Admitting: Ophthalmology

## 2021-02-17 ENCOUNTER — Other Ambulatory Visit: Payer: Self-pay

## 2021-02-17 ENCOUNTER — Encounter (INDEPENDENT_AMBULATORY_CARE_PROVIDER_SITE_OTHER): Payer: Self-pay | Admitting: Ophthalmology

## 2021-02-17 DIAGNOSIS — H34831 Tributary (branch) retinal vein occlusion, right eye, with macular edema: Secondary | ICD-10-CM

## 2021-02-17 DIAGNOSIS — H353211 Exudative age-related macular degeneration, right eye, with active choroidal neovascularization: Secondary | ICD-10-CM

## 2021-02-17 DIAGNOSIS — H353122 Nonexudative age-related macular degeneration, left eye, intermediate dry stage: Secondary | ICD-10-CM

## 2021-02-17 DIAGNOSIS — H04123 Dry eye syndrome of bilateral lacrimal glands: Secondary | ICD-10-CM | POA: Diagnosis not present

## 2021-02-17 DIAGNOSIS — H401133 Primary open-angle glaucoma, bilateral, severe stage: Secondary | ICD-10-CM | POA: Diagnosis not present

## 2021-02-17 DIAGNOSIS — I10 Essential (primary) hypertension: Secondary | ICD-10-CM

## 2021-02-17 DIAGNOSIS — H3581 Retinal edema: Secondary | ICD-10-CM

## 2021-02-17 DIAGNOSIS — Z961 Presence of intraocular lens: Secondary | ICD-10-CM

## 2021-02-17 DIAGNOSIS — H4311 Vitreous hemorrhage, right eye: Secondary | ICD-10-CM | POA: Diagnosis not present

## 2021-02-17 DIAGNOSIS — H35033 Hypertensive retinopathy, bilateral: Secondary | ICD-10-CM | POA: Diagnosis not present

## 2021-02-17 MED ORDER — BEVACIZUMAB CHEMO INJECTION 1.25MG/0.05ML SYRINGE FOR KALEIDOSCOPE
1.2500 mg | INTRAVITREAL | Status: AC | PRN
  Administered 2021-02-17: 1.25 mg via INTRAVITREAL

## 2021-02-18 ENCOUNTER — Encounter: Payer: Self-pay | Admitting: Podiatry

## 2021-02-18 ENCOUNTER — Ambulatory Visit: Payer: Medicare Other | Admitting: Podiatry

## 2021-02-18 DIAGNOSIS — E1142 Type 2 diabetes mellitus with diabetic polyneuropathy: Secondary | ICD-10-CM | POA: Diagnosis not present

## 2021-02-18 DIAGNOSIS — L84 Corns and callosities: Secondary | ICD-10-CM | POA: Diagnosis not present

## 2021-02-18 NOTE — Progress Notes (Signed)
  Subjective:  Patient ID: [name], female    DOB: 1922-12-01,  MRN: [id number]  Chief Complaint  Patient presents with  . Foot Ulcer    Right foot ulcer     87 y.o. female presents with the above complaint. History confirmed with patient.  She states that is still very painful.  But it has stayed completely healed.  Objective:  Physical Exam: warm, good capillary refill, no trophic changes or ulcerative lesions, normal DP and PT pulses and normal sensory exam.  Right Foot: Submetatarsal 3 and 4 ulceration has healed with simple overlying keratosis Assessment:   1. Diabetic peripheral neuropathy associated with type 2 diabetes mellitus (HCC)   2. Pre-ulcerative calluses      Plan:  Patient was evaluated and treated and all questions answered.  Ulcer right foot -Clinically healed and completely epithelialized.  At this point she can use urea cream to keep the nail soft.  She is awaiting her orthopedic shoes.  For now she is will continue utilizing cam boot with offloading device.  When she can return to regular shoes we will continue to monitor.  She will follow-up with Dr. Lilian Kapur in 3 months.  No follow-ups on file.

## 2021-02-22 DIAGNOSIS — D509 Iron deficiency anemia, unspecified: Secondary | ICD-10-CM | POA: Diagnosis not present

## 2021-02-22 DIAGNOSIS — E559 Vitamin D deficiency, unspecified: Secondary | ICD-10-CM | POA: Diagnosis not present

## 2021-02-22 DIAGNOSIS — I2510 Atherosclerotic heart disease of native coronary artery without angina pectoris: Secondary | ICD-10-CM | POA: Diagnosis not present

## 2021-02-22 DIAGNOSIS — I10 Essential (primary) hypertension: Secondary | ICD-10-CM | POA: Diagnosis not present

## 2021-02-22 DIAGNOSIS — E11621 Type 2 diabetes mellitus with foot ulcer: Secondary | ICD-10-CM | POA: Diagnosis not present

## 2021-02-22 DIAGNOSIS — N183 Chronic kidney disease, stage 3 unspecified: Secondary | ICD-10-CM | POA: Diagnosis not present

## 2021-02-22 DIAGNOSIS — E785 Hyperlipidemia, unspecified: Secondary | ICD-10-CM | POA: Diagnosis not present

## 2021-02-22 DIAGNOSIS — E1122 Type 2 diabetes mellitus with diabetic chronic kidney disease: Secondary | ICD-10-CM | POA: Diagnosis not present

## 2021-02-22 DIAGNOSIS — Z0000 Encounter for general adult medical examination without abnormal findings: Secondary | ICD-10-CM | POA: Diagnosis not present

## 2021-02-22 DIAGNOSIS — K219 Gastro-esophageal reflux disease without esophagitis: Secondary | ICD-10-CM | POA: Diagnosis not present

## 2021-02-22 DIAGNOSIS — L97501 Non-pressure chronic ulcer of other part of unspecified foot limited to breakdown of skin: Secondary | ICD-10-CM | POA: Diagnosis not present

## 2021-02-22 DIAGNOSIS — N3020 Other chronic cystitis without hematuria: Secondary | ICD-10-CM | POA: Diagnosis not present

## 2021-02-26 ENCOUNTER — Encounter: Payer: Self-pay | Admitting: *Deleted

## 2021-03-03 DIAGNOSIS — M7062 Trochanteric bursitis, left hip: Secondary | ICD-10-CM | POA: Diagnosis not present

## 2021-03-03 DIAGNOSIS — M1711 Unilateral primary osteoarthritis, right knee: Secondary | ICD-10-CM | POA: Diagnosis not present

## 2021-03-11 DIAGNOSIS — R350 Frequency of micturition: Secondary | ICD-10-CM | POA: Diagnosis not present

## 2021-03-22 NOTE — Progress Notes (Signed)
[facility] Note  03/25/2021     CHIEF COMPLAINT Patient presents for Retina Follow Up   HISTORY OF PRESENT ILLNESS: [name] is a 87 y.o. female who presents to the clinic today for:  HPI     Retina Follow Up   Patient presents with  CRVO/BRVO.  In right eye.  Duration of 5 weeks.  Since onset it is stable.  I, the attending physician,  performed the HPI with the patient and updated documentation appropriately.        Comments   Pt here for 5 week ret follow up BRVO w/ CME OD, EXU ARMD OD. Pt states some fuzziness in vision yesterday but it went away. Reports no changes vision wise.       Last edited by Bernarda Caffey, MD on 03/25/2021 10:12 AM.     Pts daughter states pt was complaining about "fuzzy" vision yesterday  Referring physician: Warden Fillers, MD [address] [place],  [place] [phone]  HISTORICAL INFORMATION:   Selected notes from the MEDICAL RECORD NUMBER Referred by Dr. Midge Aver for concern of RVO OS   CURRENT MEDICATIONS: Current Outpatient Medications (Ophthalmic Drugs)  Medication Sig   Loteprednol Etabonate (LOTEMAX SM) 0.38 % GEL Place 1 drop into the right eye 3 (three) times daily.   brimonidine (ALPHAGAN) 0.2 % ophthalmic solution brimonidine 0.2 % eye drops  INSTILL 1 DROP INTO BOTH EYES TWICE A DAY   dorzolamide-timolol (COSOPT) 22.3-6.8 MG/ML ophthalmic solution Place 1 drop into both eyes 2 (two) times daily.    latanoprost (XALATAN) 0.005 % ophthalmic solution latanoprost 0.005 % eye drops  INSTILL 1 DROP INTO RIGHT EYE AT BEDTIME   Polyethyl Glycol-Propyl Glycol (SYSTANE OP) Apply 1 drop to eye 2 (two) times daily.   No current facility-administered medications for this visit. (Ophthalmic Drugs)   Current Outpatient Medications (Other)  Medication Sig   Acetaminophen 325 MG CAPS Take 325 mg by mouth 3 (three) times daily as needed.   aspirin 81 MG chewable tablet Chew 81 mg by mouth  daily.   atorvastatin (LIPITOR) 80 MG tablet Take 1 tablet (80 mg total) by mouth daily at 6 PM.   Calcium Carbonate-Vitamin D (CALTRATE 600+D PO) Take 1 tablet by mouth 2 (two) times daily.   cephALEXin (KEFLEX) 250 MG capsule Take 250 mg by mouth at bedtime.   chlorpheniramine (CHLOR-TRIMETON) 4 MG tablet Take 4 mg by mouth every 4 (four) hours as needed for allergies.   denosumab (PROLIA) 60 MG/ML SOSY injection See admin instructions.   diclofenac sodium (VOLTAREN) 1 % GEL diclofenac 1 % topical gel  AS DIRECTED TWICE A DAY AS NEEDED TRANSDERMAL 14 DAYS   famotidine (PEPCID) 20 MG tablet Take 20 mg by mouth daily.   FLUZONE HIGH-DOSE QUADRIVALENT 0.7 ML SUSY    furosemide (LASIX) 20 MG tablet Take 20 mg by mouth as needed. Every other day as of 3/17 due to high K   metoprolol (LOPRESSOR) 50 MG tablet Take 25 mg by mouth 2 (two) times daily.   Multiple Vitamins-Minerals (MULTIPLE VITAMINS/WOMENS PO) Take 1 tablet by mouth daily. W/vitamin D   No current facility-administered medications for this visit. (Other)      REVIEW OF SYSTEMS: ROS   Positive for: Neurological, Genitourinary, Musculoskeletal, Cardiovascular, Eyes Negative for: Constitutional, Gastrointestinal, Skin, HENT, Endocrine, Respiratory, Psychiatric, Allergic/Imm, Heme/Lymph Last edited by Kingsley Spittle, COT on 03/25/2021  8:27 AM.  ALLERGIES Allergies  Allergen Reactions   Meloxicam Hives   Other Other (See Comments)   Guaifenesin Er Rash    Other reaction(s): rash   Nitrofuran Derivatives Rash    PAST MEDICAL HISTORY Past Medical History:  Diagnosis Date   Anemia    Arthritis    Glaucoma    OU   Gout, joint    Hypertension    Hypertensive retinopathy    OU   Macular degeneration    OU   NSTEMI (non-ST elevated myocardial infarction) ([place]) 08/2015   Stroke [facility])    Past Surgical History:  Procedure Laterality Date   ABDOMINAL HYSTERECTOMY     BREAST SURGERY     CYST   CARDIAC  CATHETERIZATION N/A 08/18/2015   Procedure: Left Heart Cath and Coronary Angiography;  Surgeon: Troy Sine, MD;  Location: [facility];  Service: Cardiovascular;  Laterality: N/A;   CARDIAC CATHETERIZATION  08/18/2015   Procedure: Coronary Stent Intervention;  Surgeon: Troy Sine, MD;  Location: [facility];  Service: Cardiovascular;;   CATARACT EXTRACTION Bilateral    DILATION AND CURETTAGE OF UTERUS     EYE SURGERY Bilateral    HERNIA REPAIR     IRIDOTOMY / IRIDECTOMY Right    JOINT REPLACEMENT  left knee   REFRACTIVE SURGERY Right 2017    FAMILY HISTORY Family History  Problem Relation Age of Onset   Heart disease Mother    Stroke Mother     SOCIAL HISTORY Social History   Tobacco Use   Smoking status: Never   Smokeless tobacco: Never  Substance Use Topics   Alcohol use: No    Alcohol/week: 0.0 standard drinks   Drug use: No         OPHTHALMIC EXAM:  Base Eye Exam     Visual Acuity (Snellen - Linear)       Right Left   Dist cc HM 20/30   Dist ph cc  NI    Correction: Glasses         Tonometry (Tonopen, 8:37 AM)       Right Left   Pressure 14 17         Pupils       Dark Light Shape React APD   Right 3 2 Round Minimal None   Left 3 2 Round Minimal None         Visual Fields       Left Right   Restrictions  Total superior nasal deficiency; Partial outer superior temporal, inferior temporal, inferior nasal deficiencies         Neuro/Psych     Oriented x3: Yes   Mood/Affect: Normal         Dilation     Both eyes: 1.0% Mydriacyl, 2.5% Phenylephrine @ 8:37 AM           Slit Lamp and Fundus Exam     Slit Lamp Exam       Right Left   Lids/Lashes Dermatochalasis - upper lid, mild Meibomian gland dysfunction Dermatochalasis - upper lid, mild Meibomian gland dysfunction   Conjunctiva/Sclera Trace Injection Trace Injection   Cornea Arcus, 3+ Descemet's folds, well healed temporal cataract wounds, mild endo  pigment Arcus, 2-3+fine Punctate epithelial erosions, Well healed cataract wounds   Anterior Chamber Deep, 0.5+pigment Deep and quiet   Iris Round and poorly dilated Round and moderately dilated to 53m   Lens PC IOL in good position with open PC PC IOL in good  position with open PC   Vitreous Vitreous syneresis, +RBC, mild diffuse VH, vitreous condensations, +pigment Vitreous syneresis, Posterior vitreous detachment         Fundus Exam       Right Left   Disc Hazy view, White, 3+pallor, +cupping, thin inf rim and temp PPA 1+ Pallor, Sharp rim, +cupping, temporal Peripapillary atrophy   C/D Ratio 0.9 0.6   Macula Hazy view from vitreous opacities -- grossly flat, +edema, central pigment clump, mild fibrosis Flat, good foveal reflex, RPE mottling and clumping, drusen, No heme or edema, focal cystic changes -- stably improved   Vessels attenuated, Tortuous Vascular attenuation, mild Tortuousity   Periphery Hazy view, grossly Attached, obscured by VH and vitreous condensations Attached, No heme            Refraction     Wearing Rx       Sphere Cylinder Axis   Right -1.50 +2.25 173   Left -0.75 +2.25 166            IMAGING AND PROCEDURES  Imaging and Procedures for '@TODAY' @  OCT, Retina - OU - Both Eyes       Right Eye Quality was poor. Progression has been stable. Findings include subretinal hyper-reflective material, intraretinal fluid, intraretinal hyper-reflective material, epiretinal membrane, no SRF, outer retinal atrophy, abnormal foveal contour (Poor image quality -- retina attached, persistent vitreous opacities).   Left Eye Quality was borderline. Central Foveal Thickness: 254. Progression has been stable. Findings include normal foveal contour, no SRF, retinal drusen , outer retinal atrophy, no IRF (stable improvement of focal IRF SN fovea, patchy ORA).   Notes *Images captured and stored on drive  Diagnosis / Impression:  OD: Poor image quality -- retina  attached, persistent vitreous opacities OS: NFP, no SRF, +drusen -- stable improvement of focal IRF SN fovea, patchy ORA  Clinical management:  See below  Abbreviations: NFP - Normal foveal profile. CME - cystoid macular edema. PED - pigment epithelial detachment. IRF - intraretinal fluid. SRF - subretinal fluid. EZ - ellipsoid zone. ERM - epiretinal membrane. ORA - outer retinal atrophy. ORT - outer retinal tubulation. SRHM - subretinal hyper-reflective material      Intravitreal Injection, Pharmacologic Agent - OD - Right Eye       Time Out 03/25/2021. 9:19 AM. Confirmed correct patient, procedure, site, and patient consented.   Anesthesia Topical anesthesia was used. Anesthetic medications included Lidocaine 2%, Proparacaine 0.5%.   Procedure Preparation included 5% betadine to ocular surface, eyelid speculum. A (32g) needle was used.   Injection: 1.25 mg Bevacizumab 1.16m/0.05ml   Route: Intravitreal, Site: Right Eye   NDC: 50242-060-01, Lot:: [id number] Expiration date: 05/21/2021, Waste: 0.05 mL   Post-op Post injection exam found visual acuity of at least counting fingers. The patient tolerated the procedure well. There were no complications. The patient received written and verbal post procedure care education. Post injection medications were not given.               ASSESSMENT/PLAN:    ICD-10-CM   1. Branch retinal vein occlusion of right eye with macular edema  H34.8310 Intravitreal Injection, Pharmacologic Agent - OD - Right Eye    Bevacizumab (AVASTIN) SOLN 1.25 mg    2. Retinal edema  H35.81 OCT, Retina - OU - Both Eyes    3. Vitreous hemorrhage of right eye ([place]  H43.11     4. Exudative age-related macular degeneration of right eye with active choroidal neovascularization ([place]  H35.3211  5. 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 Essential hypertension  I10     7. Hypertensive retinopathy of  both eyes  H35.033     8. Primary open angle glaucoma (POAG) of both eyes, severe stage  H40.1133     9. Pseudophakia of both eyes  Z96.1     10. Dry eyes, bilateral  H04.123     11. Corneal edema  H18.20        1-3. Inferior HRVO w/ CME and VH, OD  - pt lost to f/u from 10/16/2019 to 07/14/2020 (9 mos)  - presenting BCVA CF 2' (09.14.20)  - pt subjectively reports decline in vision OD since Feb 2020  - presenting OCT shows severe CME/IRF temporal macula, +SRF overlying low PED (?exudative ARMD component)  - S/p IVA OD #1 09.14.20, #2 (10.20.20), #3 (12.02.20), #4 (01.06.21), #5 (10.05.21), #6 (11.03.21), #7 (12.01.21), #8 (12.29.22), #9 (04.13.22), #10 (05.11.22)  - OCT -- Poor image quality due to old VH / vit condensations -- persistent vitreous opacities  - discussed findings, poor prognosis and treatment options  - recommend IVA OD #11 today (06.16.22) for VH and persistent IRF -- pt and daughter in agreement  - RBA of procedure discussed, questions answered  - informed consent obtained and signed  - see procedure note  - f/u 5 weeks, DFE, OCT, possible injection  4. Exudative age related macular degeneration, OD    - initial OCT showed +IRF/SRF -- SRF overlying low lying PED  - original exam shows +CNVM with surrounding heme -- posterior exam impeded by old VH  - suspect exudative ARMD component complicating HRVO w/ CME  - S/p IVA OD #1 (09.14.20), #2 (10.20.20), #3 (12.02.20), #4 (01.06.21), #5 (10.05.21), #6 (11.03.21), #7 (12.01.21), #8 (12.29.22), #9 (04.13.22), #10 (05.11.22)  - recommend IVA OD #11 today as above  - f/u in 5 weeks -- DFE/OCT, possible injection  5. Age related macular degeneration, non-exudative, OS  - OCT shows stable improvement of focal cystic changes  - no heme or edema visible on exam  - recommend close observation  - cont Amsler grid monitoring   - f/u 5 weeks  6,7. Hypertensive retinopathy OU  - discussed importance of tight BP control  -  monitor   8. POAG OU -- severe stage  - under the expert management of Dr. Midge Aver  - s/p SLT OD 3.23.2017  - s/p TDC OD w/ [name], 3.30.21  - IOP good today at 14,17  - patient on cosopt bid OU, brimonidine bid OU -- increase to TID OD only to balance addition of Lotemax SM for corneal edema  9. Pseudophakia OU  - s/p CE/IOL OU  - beautiful surgeries, doing well  - monitor   10. Dry eyes OU (OD > OS)  - recommend artificial tears and lubricating ointment as needed  11. Corneal edema OD  - 3+ Descemet folds  - recommend Lotemax SM OD TID  - increase Brim and Cosopt to TID OD only   Ophthalmic Meds Ordered this visit:  Meds ordered this encounter  Medications   DISCONTD: loteprednol (LOTEMAX) 0.5 % ophthalmic suspension    Sig: Place 1 drop into the right eye in the morning, at noon, and at bedtime.    Dispense:  10 mL    Refill:  1   Bevacizumab (AVASTIN) SOLN 1.25 mg   Loteprednol Etabonate (LOTEMAX SM) 0.38 % GEL    Sig:  Place 1 drop into the right eye 3 (three) times daily.    Dispense:  5 g    Refill:  3        Return in about 5 weeks (around 04/29/2021) for f/u HRVO OD / exu ARMD OD, DFE, OCT.  There are no Patient Instructions on file for this visit.   Explained the diagnoses, plan, and follow up with the patient and they expressed understanding.  Patient expressed understanding of the importance of proper follow up care.   This document serves as a record of services personally performed by Gardiner Sleeper, MD, PhD. It was created on their behalf by Leonie Douglas, an ophthalmic technician. The creation of this record is the provider's dictation and/or activities during the visit.    Electronically signed by: Leonie Douglas COA, 03/25/21  10:22 AM  This document serves as a record of services personally performed by Gardiner Sleeper, MD, PhD. It was created on their behalf by San Jetty. Owens Shark, OA an ophthalmic technician. The creation of this record is the  provider's dictation and/or activities during the visit.    Electronically signed by: San Jetty. Marguerita Merles 06.16.2022 10:22 AM   Gardiner Sleeper, M.D., Ph.D. Diseases & Surgery of the Retina and [place] 03/25/2021  I have reviewed the above documentation for accuracy and completeness, and I agree with the above. Gardiner Sleeper, M.D., Ph.D. 03/25/21 10:22 AM   Abbreviations: M myopia (nearsighted); A astigmatism; H hyperopia (farsighted); P presbyopia; Mrx spectacle prescription;  CTL contact lenses; OD right eye; OS left eye; OU both eyes  XT exotropia; ET esotropia; PEK punctate epithelial keratitis; PEE punctate epithelial erosions; DES dry eye syndrome; MGD meibomian gland dysfunction; ATs artificial tears; PFAT's preservative free artificial tears; [place] nuclear sclerotic cataract; PSC posterior subcapsular cataract; ERM epi-retinal membrane; PVD posterior vitreous detachment; RD retinal detachment; DM diabetes mellitus; DR diabetic retinopathy; NPDR non-proliferative diabetic retinopathy; PDR proliferative diabetic retinopathy; CSME clinically significant macular edema; DME diabetic macular edema; dbh dot blot hemorrhages; CWS cotton wool spot; POAG primary open angle glaucoma; C/D cup-to-disc ratio; HVF humphrey visual field; GVF goldmann visual field; OCT optical coherence tomography; IOP intraocular pressure; BRVO Branch retinal vein occlusion; CRVO central retinal vein occlusion; CRAO central retinal artery occlusion; BRAO branch retinal artery occlusion; RT retinal tear; SB scleral buckle; PPV pars plana vitrectomy; VH Vitreous hemorrhage; PRP panretinal laser photocoagulation; IVK intravitreal kenalog; VMT vitreomacular traction; MH Macular hole;  NVD neovascularization of the disc; NVE neovascularization elsewhere; AREDS age related eye disease study; ARMD age related macular degeneration; POAG primary open angle glaucoma; EBMD epithelial/anterior basement  membrane dystrophy; ACIOL anterior chamber intraocular lens; IOL intraocular lens; PCIOL posterior chamber intraocular lens; Phaco/IOL phacoemulsification with intraocular lens placement; [place] photorefractive keratectomy; LASIK laser assisted in situ keratomileusis; HTN hypertension; DM diabetes mellitus; COPD chronic obstructive pulmonary disease

## 2021-03-22 NOTE — Progress Notes (Signed)
Patient seen by EJ with ohi today. Patient was measured for diabetic shoes and shoes were selected to be ordered.

## 2021-03-23 DIAGNOSIS — M25512 Pain in left shoulder: Secondary | ICD-10-CM | POA: Diagnosis not present

## 2021-03-23 DIAGNOSIS — M25522 Pain in left elbow: Secondary | ICD-10-CM | POA: Diagnosis not present

## 2021-03-23 DIAGNOSIS — M25521 Pain in right elbow: Secondary | ICD-10-CM | POA: Diagnosis not present

## 2021-03-23 DIAGNOSIS — M25511 Pain in right shoulder: Secondary | ICD-10-CM | POA: Diagnosis not present

## 2021-03-25 ENCOUNTER — Encounter (INDEPENDENT_AMBULATORY_CARE_PROVIDER_SITE_OTHER): Payer: Self-pay | Admitting: Ophthalmology

## 2021-03-25 ENCOUNTER — Other Ambulatory Visit: Payer: Self-pay

## 2021-03-25 ENCOUNTER — Ambulatory Visit (INDEPENDENT_AMBULATORY_CARE_PROVIDER_SITE_OTHER): Payer: Medicare Other | Admitting: Ophthalmology

## 2021-03-25 DIAGNOSIS — H1820 Unspecified corneal edema: Secondary | ICD-10-CM | POA: Diagnosis not present

## 2021-03-25 DIAGNOSIS — H353122 Nonexudative age-related macular degeneration, left eye, intermediate dry stage: Secondary | ICD-10-CM

## 2021-03-25 DIAGNOSIS — Z961 Presence of intraocular lens: Secondary | ICD-10-CM | POA: Diagnosis not present

## 2021-03-25 DIAGNOSIS — H401133 Primary open-angle glaucoma, bilateral, severe stage: Secondary | ICD-10-CM

## 2021-03-25 DIAGNOSIS — H4311 Vitreous hemorrhage, right eye: Secondary | ICD-10-CM

## 2021-03-25 DIAGNOSIS — H3581 Retinal edema: Secondary | ICD-10-CM

## 2021-03-25 DIAGNOSIS — H353211 Exudative age-related macular degeneration, right eye, with active choroidal neovascularization: Secondary | ICD-10-CM | POA: Diagnosis not present

## 2021-03-25 DIAGNOSIS — H34831 Tributary (branch) retinal vein occlusion, right eye, with macular edema: Secondary | ICD-10-CM

## 2021-03-25 DIAGNOSIS — I10 Essential (primary) hypertension: Secondary | ICD-10-CM | POA: Diagnosis not present

## 2021-03-25 DIAGNOSIS — H04123 Dry eye syndrome of bilateral lacrimal glands: Secondary | ICD-10-CM | POA: Diagnosis not present

## 2021-03-25 DIAGNOSIS — H35033 Hypertensive retinopathy, bilateral: Secondary | ICD-10-CM

## 2021-03-25 MED ORDER — BEVACIZUMAB CHEMO INJECTION 1.25MG/0.05ML SYRINGE FOR KALEIDOSCOPE
1.2500 mg | INTRAVITREAL | Status: AC | PRN
  Administered 2021-03-25: 1.25 mg via INTRAVITREAL

## 2021-03-25 MED ORDER — LOTEMAX SM 0.38 % OP GEL: 1.0000 [drp] | Freq: Three times a day (TID) | OPHTHALMIC | 3 refills | Status: DC

## 2021-03-25 MED ORDER — LOTEPREDNOL ETABONATE 0.5 % OP SUSP: 1.0000 [drp] | Freq: Three times a day (TID) | OPHTHALMIC | 1 refills | Status: DC

## 2021-03-26 DIAGNOSIS — S81811A Laceration without foreign body, right lower leg, initial encounter: Secondary | ICD-10-CM | POA: Diagnosis not present

## 2021-04-01 DIAGNOSIS — R350 Frequency of micturition: Secondary | ICD-10-CM | POA: Diagnosis not present

## 2021-04-13 DIAGNOSIS — M810 Age-related osteoporosis without current pathological fracture: Secondary | ICD-10-CM | POA: Diagnosis not present

## 2021-04-15 ENCOUNTER — Other Ambulatory Visit: Payer: Medicare Other

## 2021-04-15 ENCOUNTER — Ambulatory Visit: Payer: Medicare Other | Admitting: Podiatry

## 2021-04-15 ENCOUNTER — Other Ambulatory Visit: Payer: Self-pay

## 2021-04-15 DIAGNOSIS — B351 Tinea unguium: Secondary | ICD-10-CM

## 2021-04-15 DIAGNOSIS — E11621 Type 2 diabetes mellitus with foot ulcer: Secondary | ICD-10-CM | POA: Diagnosis not present

## 2021-04-15 DIAGNOSIS — R52 Pain, unspecified: Secondary | ICD-10-CM

## 2021-04-15 DIAGNOSIS — M2042 Other hammer toe(s) (acquired), left foot: Secondary | ICD-10-CM | POA: Diagnosis not present

## 2021-04-15 DIAGNOSIS — M79674 Pain in right toe(s): Secondary | ICD-10-CM

## 2021-04-15 DIAGNOSIS — M2041 Other hammer toe(s) (acquired), right foot: Secondary | ICD-10-CM | POA: Diagnosis not present

## 2021-04-15 DIAGNOSIS — M2011 Hallux valgus (acquired), right foot: Secondary | ICD-10-CM | POA: Diagnosis not present

## 2021-04-15 DIAGNOSIS — L84 Corns and callosities: Secondary | ICD-10-CM | POA: Diagnosis not present

## 2021-04-15 DIAGNOSIS — M2012 Hallux valgus (acquired), left foot: Secondary | ICD-10-CM | POA: Diagnosis not present

## 2021-04-15 DIAGNOSIS — M79675 Pain in left toe(s): Secondary | ICD-10-CM

## 2021-04-15 DIAGNOSIS — E1142 Type 2 diabetes mellitus with diabetic polyneuropathy: Secondary | ICD-10-CM | POA: Diagnosis not present

## 2021-04-18 ENCOUNTER — Encounter: Payer: Self-pay | Admitting: Podiatry

## 2021-04-18 NOTE — Progress Notes (Signed)
  Subjective:  Patient ID: [name], female    DOB: 1923/10/02,  MRN: [id number]  Chief Complaint  Patient presents with   Diabetes     sore callus-callus removal/nail trim-diabetic    87 y.o. female presents with the above complaint. History confirmed with patient.  Calluses been very painful for her  Objective:  Physical Exam: warm, good capillary refill, no trophic changes or ulcerative lesions, normal DP and PT pulses and normal sensory exam.  She has onychomycosis of all toenails  Right Foot: Submetatarsal 3 and 4 preulcerative callus Assessment:   1. Pain due to onychomycosis of toenails of both feet   2. Callus of foot   3. Pain       Plan:  Patient was evaluated and treated and all questions answered.  All symptomatic hyperkeratoses were safely debrided with a sterile #15 blade to patient's level of comfort without incident. We discussed preventative and palliative care of these lesions including supportive and accommodative shoegear, padding, prefabricated and custom molded accommodative orthoses, use of a pumice stone and lotions/creams daily.  Discussed the etiology and treatment options for the condition in detail with the patient. Educated patient on the topical and oral treatment options for mycotic nails. Recommended debridement of the nails today. Sharp and mechanical debridement performed of all painful and mycotic nails today. Nails debrided in length and thickness using a nail nipper to level of comfort. Discussed treatment options including appropriate shoe gear. Follow up as needed for painful nails.    No follow-ups on file.

## 2021-04-21 DIAGNOSIS — L570 Actinic keratosis: Secondary | ICD-10-CM | POA: Diagnosis not present

## 2021-04-21 DIAGNOSIS — L720 Epidermal cyst: Secondary | ICD-10-CM | POA: Diagnosis not present

## 2021-04-21 DIAGNOSIS — L820 Inflamed seborrheic keratosis: Secondary | ICD-10-CM | POA: Diagnosis not present

## 2021-04-21 DIAGNOSIS — L858 Other specified epidermal thickening: Secondary | ICD-10-CM | POA: Diagnosis not present

## 2021-04-28 NOTE — Progress Notes (Signed)
[facility] - Clinic Note  04/29/2021     CHIEF COMPLAINT Patient presents for Retina Follow Up   HISTORY OF PRESENT ILLNESS: [name] is a 87 y.o. female who presents to the clinic today for:  HPI     Retina Follow Up   Patient presents with  CRVO/BRVO.  In right eye.  Since onset it is stable.  I, the attending physician,  performed the HPI with the patient and updated documentation appropriately.        Comments   Pt states vision has been pretty good, daughter states she had pain in OS for about 2 hours yesterday that cleared on its own, pt denies fol or floaters, pt is using brim and cosopt TID OD and Lotemax SM TID OD      Last edited by Rennis Chris, MD on 04/29/2021 10:20 AM.      Referring physician: Sallye Lat, MD [address] [place],  [place] [address]  HISTORICAL INFORMATION:   Selected notes from the MEDICAL RECORD NUMBER Referred by Dr. Marchelle Gearing for concern of RVO OS   CURRENT MEDICATIONS: Current Outpatient Medications (Ophthalmic Drugs)  Medication Sig   sodium chloride (MURO 128) 2 % ophthalmic solution Place 1 drop into the right eye 4 (four) times daily.   brimonidine (ALPHAGAN) 0.2 % ophthalmic solution brimonidine 0.2 % eye drops  INSTILL 1 DROP INTO BOTH EYES TWICE A DAY   dorzolamide-timolol (COSOPT) 22.3-6.8 MG/ML ophthalmic solution Place 1 drop into both eyes 2 (two) times daily.    latanoprost (XALATAN) 0.005 % ophthalmic solution latanoprost 0.005 % eye drops  INSTILL 1 DROP INTO RIGHT EYE AT BEDTIME   Loteprednol Etabonate (LOTEMAX SM) 0.38 % GEL Place 1 drop into the right eye 3 (three) times daily.   Polyethyl Glycol-Propyl Glycol (SYSTANE OP) Apply 1 drop to eye 2 (two) times daily.   No current facility-administered medications for this visit. (Ophthalmic Drugs)   Current Outpatient Medications (Other)  Medication Sig   Acetaminophen 325 MG CAPS Take 325 mg by mouth 3 (three) times daily  as needed.   aspirin 81 MG chewable tablet Chew 81 mg by mouth daily.   atorvastatin (LIPITOR) 80 MG tablet Take 1 tablet (80 mg total) by mouth daily at 6 PM.   Calcium Carbonate-Vitamin D (CALTRATE 600+D PO) Take 1 tablet by mouth 2 (two) times daily.   cephALEXin (KEFLEX) 250 MG capsule Take 250 mg by mouth at bedtime.   chlorpheniramine (CHLOR-TRIMETON) 4 MG tablet Take 4 mg by mouth every 4 (four) hours as needed for allergies.   denosumab (PROLIA) 60 MG/ML SOSY injection See admin instructions.   diclofenac sodium (VOLTAREN) 1 % GEL diclofenac 1 % topical gel  AS DIRECTED TWICE A DAY AS NEEDED TRANSDERMAL 14 DAYS   famotidine (PEPCID) 20 MG tablet Take 20 mg by mouth daily.   FLUZONE HIGH-DOSE QUADRIVALENT 0.7 ML SUSY    furosemide (LASIX) 20 MG tablet Take 20 mg by mouth as needed. Every other day as of 3/17 due to high K   metoprolol (LOPRESSOR) 50 MG tablet Take 25 mg by mouth 2 (two) times daily.   Multiple Vitamins-Minerals (MULTIPLE VITAMINS/WOMENS PO) Take 1 tablet by mouth daily. W/vitamin D   No current facility-administered medications for this visit. (Other)      REVIEW OF SYSTEMS: ROS   Positive for: Cardiovascular, Eyes, Respiratory Negative for: Constitutional, Gastrointestinal, Neurological, Skin, Genitourinary, Musculoskeletal, HENT, Endocrine, Psychiatric, Allergic/Imm, Heme/Lymph  Last edited by Posey Boyer, COT on 04/29/2021  8:21 AM.        ALLERGIES Allergies  Allergen Reactions   Meloxicam Hives   Other Other (See Comments)   Guaifenesin Er Rash    Other reaction(s): rash   Nitrofuran Derivatives Rash    PAST MEDICAL HISTORY Past Medical History:  Diagnosis Date   Anemia    Arthritis    Glaucoma    OU   Gout, joint    Hypertension    Hypertensive retinopathy    OU   Macular degeneration    OU   NSTEMI (non-ST elevated myocardial infarction) (HCC) 08/2015   Stroke [facility])    Past Surgical History:  Procedure Laterality Date    ABDOMINAL HYSTERECTOMY     BREAST SURGERY     CYST   CARDIAC CATHETERIZATION N/A 08/18/2015   Procedure: Left Heart Cath and Coronary Angiography;  Surgeon: Lennette Bihari, MD;  Location: [facility] INVASIVE CV LAB;  Service: Cardiovascular;  Laterality: N/A;   CARDIAC CATHETERIZATION  08/18/2015   Procedure: Coronary Stent Intervention;  Surgeon: Lennette Bihari, MD;  Location: [facility] INVASIVE CV LAB;  Service: Cardiovascular;;   CATARACT EXTRACTION Bilateral    DILATION AND CURETTAGE OF UTERUS     EYE SURGERY Bilateral    HERNIA REPAIR     IRIDOTOMY / IRIDECTOMY Right    JOINT REPLACEMENT  left knee   REFRACTIVE SURGERY Right 2017    FAMILY HISTORY Family History  Problem Relation Age of Onset   Heart disease Mother    Stroke Mother     SOCIAL HISTORY Social History   Tobacco Use   Smoking status: Never   Smokeless tobacco: Never  Substance Use Topics   Alcohol use: No    Alcohol/week: 0.0 standard drinks   Drug use: No         OPHTHALMIC EXAM:  Base Eye Exam     Visual Acuity (Snellen - Linear)       Right Left   Dist He[place] CF at face 20/40 -2   Dist ph [place]  20/25 -1         Tonometry (Tonopen, 8:27 AM)       Right Left   Pressure 12 14         Pupils       Dark Light Shape React APD   Right 2 1.5 Round Minimal None   Left 2 1.5 Round Minimal None         Visual Fields (Counting fingers)       Left Right    Full    Restrictions  Total superior nasal deficiency; Partial outer superior temporal, inferior temporal, inferior nasal deficiencies         Extraocular Movement       Right Left    Full, Ortho Full, Ortho         Neuro/Psych     Oriented x3: Yes   Mood/Affect: Normal         Dilation     Both eyes: 1.0% Mydriacyl, 2.5% Phenylephrine @ 8:28 AM           Slit Lamp and Fundus Exam     Slit Lamp Exam       Right Left   Lids/Lashes Dermatochalasis - upper lid, mild Telangiectasia Dermatochalasis - upper lid, mild Meibomian  gland dysfunction   Conjunctiva/Sclera White and quiet Trace Injection   Cornea Arcus, 3+ Descemet's folds, well healed temporal cataract wounds,  mild endo pigment, 2+ Punctate epithelial erosions Arcus, 1-2+Punctate epithelial erosions, Well healed cataract wounds   Anterior Chamber Deep, 0.5+pigment Deep and quiet   Iris Round and poorly dilated to 4.0 mm Round and poorly dilated to 4.79mm   Lens PC IOL in good position with open PC PC IOL in good position with open PC   Vitreous Vitreous syneresis, +RBC, mild diffuse VH, vitreous condensations, +pigment Vitreous syneresis, Posterior vitreous detachment         Fundus Exam       Right Left   Disc Hazy view, 3+pallor, +cupping, thin inf rim and temp PPA 1+ Pallor, Sharp rim, +cupping, temporal Peripapillary atrophy   C/D Ratio 0.6 0.6   Macula Hazy view from vitreous opacities and corneal edema -- grossly flat, +edema, central pigment clump, mild fibrosis Flat, good foveal reflex, RPE mottling and clumping, drusen, No heme or edema, focal cystic changes -- stably improved   Vessels attenuated, Tortuous Vascular attenuation, mild Tortuousity   Periphery Hazy view, grossly Attached, obscured by old VH and vitreous condensations Attached, No heme             IMAGING AND PROCEDURES  Imaging and Procedures for @TODAY @  OCT, Retina - OU - Both Eyes       Right Eye Quality was poor. Progression has been stable. Findings include (Poor image quality -- grossly attached, no details visible).   Left Eye Quality was borderline. Central Foveal Thickness: 250. Progression has been stable. Findings include normal foveal contour, no SRF, retinal drusen , outer retinal atrophy, no IRF (stable improvement of focal IRF SN fovea, patchy ORA).   Notes *Images captured and stored on drive  Diagnosis / Impression:  OD: Poor image quality -- grossly attached, no details visible OS: NFP, no SRF, +drusen -- stable improvement of focal IRF SN fovea,  patchy ORA  Clinical management:  See below  Abbreviations: NFP - Normal foveal profile. CME - cystoid macular edema. PED - pigment epithelial detachment. IRF - intraretinal fluid. SRF - subretinal fluid. EZ - ellipsoid zone. ERM - epiretinal membrane. ORA - outer retinal atrophy. ORT - outer retinal tubulation. SRHM - subretinal hyper-reflective material      Intravitreal Injection, Pharmacologic Agent - OD - Right Eye       Time Out 04/29/2021. 9:00 AM. Confirmed correct patient, procedure, site, and patient consented.   Anesthesia Topical anesthesia was used. Anesthetic medications included Lidocaine 2%, Proparacaine 0.5%.   Procedure Preparation included 5% betadine to ocular surface, eyelid speculum. A (32g) needle was used.   Injection: 1.25 mg Bevacizumab 1.25mg /0.20ml   Route: Intravitreal, Site: Right Eye   NDC: 0m, [id number], Expiration date: 06/07/2021, Waste: 0.05 mL   Post-op Post injection exam found visual acuity of at least counting fingers. The patient tolerated the procedure well. There were no complications. The patient received written and verbal post procedure care education. Post injection medications were not given.                ASSESSMENT/PLAN:    ICD-10-CM   1. Branch retinal vein occlusion of right eye with macular edema  H34.8310 Intravitreal Injection, Pharmacologic Agent - OD - Right Eye    Bevacizumab (AVASTIN) SOLN 1.25 mg    2. Retinal edema  H35.81 OCT, Retina - OU - Both Eyes    3. Vitreous hemorrhage of right eye (HCC)  H43.11     4. Exudative age-related macular degeneration of right eye with active choroidal neovascularization (HCC)  H35.3211     5. 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 Essential hypertension  I10     7. Hypertensive retinopathy of both eyes  H35.033     8. Primary open angle glaucoma (POAG) of both eyes, severe stage  H40.1133     9.  Pseudophakia of both eyes  Z96.1     10. Dry eyes, bilateral  H04.123     11. Corneal edema  H18.20      1-3. Inferior HRVO w/ CME and VH, OD  - pt lost to f/u from 10/16/2019 to 07/14/2020 (9 mos)  - presenting BCVA CF 2' (09.14.20)  - pt subjectively reports decline in vision OD since Feb 2020  - presenting OCT shows severe CME/IRF temporal macula, +SRF overlying low PED (?exudative ARMD component)  - S/p IVA OD #1 09.14.20, #2 (10.20.20), #3 (12.02.20), #4 (01.06.21), #5 (10.05.21), #6 (11.03.21), #7 (12.01.21), #8 (12.29.22), #9 (04.13.22), #10 (05.11.22), #11 (06.16.22)  - OCT -- Poor image quality due to old VH / vit condensations and corneal edema / haze  - discussed findings, poor prognosis and treatment options  - recommend IVA OD #12 today (07.21.22) for maintenance and for persistent VH and IRF -- pt and daughter in agreement  - RBA of procedure discussed, questions answered  - informed consent obtained and signed  - see procedure note  - f/u 5 weeks, DFE, OCT, possible injection  4. Exudative age related macular degeneration, OD    - initial OCT showed +IRF/SRF -- SRF overlying low lying PED  - original exam shows +CNVM with surrounding heme -- posterior exam impeded by old VH  - suspect exudative ARMD component complicating HRVO w/ CME  - S/p IVA OD #1 (09.14.20), #2 (10.20.20), #3 (12.02.20), #4 (01.06.21), #5 (10.05.21), #6 (11.03.21), #7 (12.01.21), #8 (12.29.22), #9 (04.13.22), #10 (05.11.22), #11 (06.16.22)  - recommend IVA OD #12 today as above  - f/u in 5 weeks -- DFE/OCT, possible injection  5. Age related macular degeneration, non-exudative, OS  - OCT shows stable improvement of focal cystic changes  - no heme or edema visible on exam  - recommend close observation  - cont Amsler grid monitoring   - f/u 5 weeks  6,7. Hypertensive retinopathy OU  - discussed importance of tight BP control  - monitor   8. POAG OU -- severe stage  - under the expert management  of Dr. Marchelle Gearinghris Groat  - s/p SLT OD 3.23.2017  - s/p TDC OD w/ Dr. Loraine GripMoya, 3.30.21  - IOP good today at 12,14  - patient on cosopt bid OU, brimonidine tid OU  9. Pseudophakia OU  - s/p CE/IOL OU  - beautiful surgeries, doing well  - monitor   10. Dry eyes OU (OD > OS)  - recommend artificial tears and lubricating ointment as needed  11. Corneal edema OD  - 3+ Descemet folds and mild haze  - recommend treatment to improve monitoring of retina and posterior pole  - cont Lotemax SM OD TID  - add Muro 128 gtts QID OD  - f/u 5 wks   Ophthalmic Meds Ordered this visit:  Meds ordered this encounter  Medications   sodium chloride (MURO 128) 2 % ophthalmic solution    Sig: Place 1 drop into the right eye 4 (four) times daily.    Dispense:  15 mL    Refill:  1   Bevacizumab (AVASTIN) SOLN 1.25 mg  Return in about 5 weeks (around 06/03/2021) for f/u HRVO OD, DFE, OCT.  There are no Patient Instructions on file for this visit.   Explained the diagnoses, plan, and follow up with the patient and they expressed understanding.  Patient expressed understanding of the importance of proper follow up care.   This document serves as a record of services personally performed by Karie Chimera, MD, PhD. It was created on their behalf by Joni Reining, an ophthalmic technician. The creation of this record is the provider's dictation and/or activities during the visit.    Electronically signed by: Joni Reining COA, 04/29/21  10:27 AM  Karie Chimera, M.D., Ph.D. Diseases & Surgery of the Retina and Vitreous [facility] 04/29/2021  I have reviewed the above documentation for accuracy and completeness, and I agree with the above. Karie Chimera, M.D., Ph.D. 04/29/21 10:27 AM   Abbreviations: M myopia (nearsighted); A astigmatism; H hyperopia (farsighted); P presbyopia; Mrx spectacle prescription;  CTL contact lenses; OD right eye; OS left eye; OU both eyes  XT  exotropia; ET esotropia; PEK punctate epithelial keratitis; PEE punctate epithelial erosions; DES dry eye syndrome; MGD meibomian gland dysfunction; ATs artificial tears; PFAT's preservative free artificial tears; NSC nuclear sclerotic cataract; PSC posterior subcapsular cataract; ERM epi-retinal membrane; PVD posterior vitreous detachment; RD retinal detachment; DM diabetes mellitus; DR diabetic retinopathy; NPDR non-proliferative diabetic retinopathy; PDR proliferative diabetic retinopathy; CSME clinically significant macular edema; DME diabetic macular edema; dbh dot blot hemorrhages; CWS cotton wool spot; POAG primary open angle glaucoma; C/D cup-to-disc ratio; HVF humphrey visual field; GVF goldmann visual field; OCT optical coherence tomography; IOP intraocular pressure; BRVO Branch retinal vein occlusion; CRVO central retinal vein occlusion; CRAO central retinal artery occlusion; BRAO branch retinal artery occlusion; RT retinal tear; SB scleral buckle; PPV pars plana vitrectomy; VH Vitreous hemorrhage; PRP panretinal laser photocoagulation; IVK intravitreal kenalog; VMT vitreomacular traction; MH Macular hole;  NVD neovascularization of the disc; NVE neovascularization elsewhere; AREDS age related eye disease study; ARMD age related macular degeneration; POAG primary open angle glaucoma; EBMD epithelial/anterior basement membrane dystrophy; ACIOL anterior chamber intraocular lens; IOL intraocular lens; PCIOL posterior chamber intraocular lens; Phaco/IOL phacoemulsification with intraocular lens placement; PRK photorefractive keratectomy; LASIK laser assisted in situ keratomileusis; HTN hypertension; DM diabetes mellitus; COPD chronic obstructive pulmonary disease

## 2021-04-29 ENCOUNTER — Ambulatory Visit (INDEPENDENT_AMBULATORY_CARE_PROVIDER_SITE_OTHER): Payer: Medicare Other | Admitting: Ophthalmology

## 2021-04-29 ENCOUNTER — Other Ambulatory Visit: Payer: Self-pay

## 2021-04-29 ENCOUNTER — Encounter (INDEPENDENT_AMBULATORY_CARE_PROVIDER_SITE_OTHER): Payer: Self-pay | Admitting: Ophthalmology

## 2021-04-29 DIAGNOSIS — H34831 Tributary (branch) retinal vein occlusion, right eye, with macular edema: Secondary | ICD-10-CM | POA: Diagnosis not present

## 2021-04-29 DIAGNOSIS — H04123 Dry eye syndrome of bilateral lacrimal glands: Secondary | ICD-10-CM | POA: Diagnosis not present

## 2021-04-29 DIAGNOSIS — H401133 Primary open-angle glaucoma, bilateral, severe stage: Secondary | ICD-10-CM

## 2021-04-29 DIAGNOSIS — H3581 Retinal edema: Secondary | ICD-10-CM | POA: Diagnosis not present

## 2021-04-29 DIAGNOSIS — H353122 Nonexudative age-related macular degeneration, left eye, intermediate dry stage: Secondary | ICD-10-CM

## 2021-04-29 DIAGNOSIS — H4311 Vitreous hemorrhage, right eye: Secondary | ICD-10-CM

## 2021-04-29 DIAGNOSIS — H353211 Exudative age-related macular degeneration, right eye, with active choroidal neovascularization: Secondary | ICD-10-CM | POA: Diagnosis not present

## 2021-04-29 DIAGNOSIS — H1820 Unspecified corneal edema: Secondary | ICD-10-CM

## 2021-04-29 DIAGNOSIS — H35033 Hypertensive retinopathy, bilateral: Secondary | ICD-10-CM | POA: Diagnosis not present

## 2021-04-29 DIAGNOSIS — I10 Essential (primary) hypertension: Secondary | ICD-10-CM

## 2021-04-29 DIAGNOSIS — Z961 Presence of intraocular lens: Secondary | ICD-10-CM

## 2021-04-29 MED ORDER — MURO 128 2 % OP SOLN: 1.0000 [drp] | Freq: Four times a day (QID) | OPHTHALMIC | 1 refills | Status: DC

## 2021-04-29 MED ORDER — BEVACIZUMAB CHEMO INJECTION 1.25MG/0.05ML SYRINGE FOR KALEIDOSCOPE
1.2500 mg | INTRAVITREAL | Status: AC | PRN
  Administered 2021-04-29: 1.25 mg via INTRAVITREAL

## 2021-05-04 DIAGNOSIS — Z961 Presence of intraocular lens: Secondary | ICD-10-CM | POA: Diagnosis not present

## 2021-05-04 DIAGNOSIS — H04123 Dry eye syndrome of bilateral lacrimal glands: Secondary | ICD-10-CM | POA: Diagnosis not present

## 2021-05-04 DIAGNOSIS — H34811 Central retinal vein occlusion, right eye, with macular edema: Secondary | ICD-10-CM | POA: Diagnosis not present

## 2021-05-04 DIAGNOSIS — H20011 Primary iridocyclitis, right eye: Secondary | ICD-10-CM | POA: Diagnosis not present

## 2021-05-04 DIAGNOSIS — H40012 Open angle with borderline findings, low risk, left eye: Secondary | ICD-10-CM | POA: Diagnosis not present

## 2021-05-04 DIAGNOSIS — H401113 Primary open-angle glaucoma, right eye, severe stage: Secondary | ICD-10-CM | POA: Diagnosis not present

## 2021-05-04 DIAGNOSIS — H353131 Nonexudative age-related macular degeneration, bilateral, early dry stage: Secondary | ICD-10-CM | POA: Diagnosis not present

## 2021-05-14 DIAGNOSIS — R350 Frequency of micturition: Secondary | ICD-10-CM | POA: Diagnosis not present

## 2021-05-31 NOTE — Progress Notes (Signed)
[facility] Note  06/04/2021     CHIEF COMPLAINT Patient presents for Retina Follow Up   HISTORY OF PRESENT ILLNESS: [name] is a 87 y.o. female who presents to the clinic today for:  HPI     Retina Follow Up   Patient presents with  CRVO/BRVO.  In right eye.  Severity is mild.  Duration of 5 weeks.  Since onset it is stable.  I, the attending physician,  performed the HPI with the patient and updated documentation appropriately.        Comments   Pt here for 5 wk ret f/u HRVO w/ CME and VH OD. Pt states vision is about the same, no changes. No ocular pain or discomfort.       Last edited by Bernarda Caffey, MD on 06/09/2021 11:29 AM.     Patient states vision the same OU.  Referring physician: Jani Gravel, MD [address] [place],  [place] [address]  HISTORICAL INFORMATION:   Selected notes from the MEDICAL RECORD NUMBER Referred by Dr. Midge Aver for concern of RVO OS   CURRENT MEDICATIONS: Current Outpatient Medications (Ophthalmic Drugs)  Medication Sig   sodium chloride (MURO 128) 5 % ophthalmic ointment Place 1 application into the right eye at bedtime.   brimonidine (ALPHAGAN) 0.2 % ophthalmic solution brimonidine 0.2 % eye drops  INSTILL 1 DROP INTO BOTH EYES TWICE A DAY   dorzolamide-timolol (COSOPT) 22.3-6.8 MG/ML ophthalmic solution Place 1 drop into both eyes 2 (two) times daily.    latanoprost (XALATAN) 0.005 % ophthalmic solution latanoprost 0.005 % eye drops  INSTILL 1 DROP INTO RIGHT EYE AT BEDTIME   Loteprednol Etabonate (LOTEMAX SM) 0.38 % GEL Place 1 drop into the right eye 3 (three) times daily.   Polyethyl Glycol-Propyl Glycol (SYSTANE OP) Apply 1 drop to eye 2 (two) times daily.   sodium chloride (MURO 128) 2 % ophthalmic solution Place 1 drop into the right eye 4 (four) times daily.   No current facility-administered medications for this visit. (Ophthalmic Drugs)   Current Outpatient Medications  (Other)  Medication Sig   Acetaminophen 325 MG CAPS Take 325 mg by mouth 3 (three) times daily as needed.   aspirin 81 MG chewable tablet Chew 81 mg by mouth daily.   atorvastatin (LIPITOR) 80 MG tablet Take 1 tablet (80 mg total) by mouth daily at 6 PM.   Calcium Carbonate-Vitamin D (CALTRATE 600+D PO) Take 1 tablet by mouth 2 (two) times daily.   cephALEXin (KEFLEX) 250 MG capsule Take 250 mg by mouth at bedtime.   chlorpheniramine (CHLOR-TRIMETON) 4 MG tablet Take 4 mg by mouth every 4 (four) hours as needed for allergies.   denosumab (PROLIA) 60 MG/ML SOSY injection See admin instructions.   diclofenac sodium (VOLTAREN) 1 % GEL diclofenac 1 % topical gel  AS DIRECTED TWICE A DAY AS NEEDED TRANSDERMAL 14 DAYS   famotidine (PEPCID) 20 MG tablet Take 20 mg by mouth daily.   FLUZONE HIGH-DOSE QUADRIVALENT 0.7 ML SUSY    furosemide (LASIX) 20 MG tablet Take 20 mg by mouth as needed. Every other day as of 3/17 due to high K   metoprolol (LOPRESSOR) 50 MG tablet Take 25 mg by mouth 2 (two) times daily.   Multiple Vitamins-Minerals (MULTIPLE VITAMINS/WOMENS PO) Take 1 tablet by mouth daily. W/vitamin D   No current facility-administered medications for this visit. (Other)    REVIEW OF SYSTEMS: ROS   Positive  for: Cardiovascular, Eyes, Respiratory Negative for: Constitutional, Gastrointestinal, Neurological, Skin, Genitourinary, Musculoskeletal, HENT, Endocrine, Psychiatric, Allergic/Imm, Heme/Lymph Last edited by Kingsley Spittle, COT on 06/04/2021  8:28 AM.      ALLERGIES Allergies  Allergen Reactions   Meloxicam Hives   Other Other (See Comments)   Guaifenesin Er Rash    Other reaction(s): rash   Nitrofuran Derivatives Rash    PAST MEDICAL HISTORY Past Medical History:  Diagnosis Date   Anemia    Arthritis    Glaucoma    OU   Gout, joint    Hypertension    Hypertensive retinopathy    OU   Macular degeneration    OU   NSTEMI (non-ST elevated myocardial infarction)  ([place]) 08/2015   Stroke [facility])    Past Surgical History:  Procedure Laterality Date   ABDOMINAL HYSTERECTOMY     BREAST SURGERY     CYST   CARDIAC CATHETERIZATION N/A 08/18/2015   Procedure: Left Heart Cath and Coronary Angiography;  Surgeon: Troy Sine, MD;  Location: [place] [facility];  Service: Cardiovascular;  Laterality: N/A;   CARDIAC CATHETERIZATION  08/18/2015   Procedure: Coronary Stent Intervention;  Surgeon: Troy Sine, MD;  Location: [place] [facility];  Service: Cardiovascular;;   CATARACT EXTRACTION Bilateral    DILATION AND CURETTAGE OF UTERUS     EYE SURGERY Bilateral    HERNIA REPAIR     IRIDOTOMY / IRIDECTOMY Right    JOINT REPLACEMENT  left knee   REFRACTIVE SURGERY Right 2017    FAMILY HISTORY Family History  Problem Relation Age of Onset   Heart disease Mother    Stroke Mother     SOCIAL HISTORY Social History   Tobacco Use   Smoking status: Never   Smokeless tobacco: Never  Substance Use Topics   Alcohol use: No    Alcohol/week: 0.0 standard drinks   Drug use: No         OPHTHALMIC EXAM:  Base Eye Exam     Visual Acuity (Snellen - Linear)       Right Left   Dist [place] CF @ face 20/30 +2   Dist ph [place]  20/25         Tonometry (Tonopen, 8:33 AM)       Right Left   Pressure 13 12         Pupils       Dark Light Shape React APD   Right 2 1 Round Minimal None   Left 2 1 Round Minimal None         Visual Fields       Left Right    Full    Restrictions  Total superior nasal deficiency; Partial outer superior temporal, inferior temporal, inferior nasal deficiencies         Extraocular Movement       Right Left    Full, Ortho Full, Ortho         Neuro/Psych     Oriented x3: Yes   Mood/Affect: Normal         Dilation     Both eyes: 1.0% Mydriacyl, 2.5% Phenylephrine @ 8:34 AM           Slit Lamp and Fundus Exam     Slit Lamp Exam       Right Left   Lids/Lashes Dermatochalasis - upper lid, mild  Telangiectasia Dermatochalasis - upper lid, mild Meibomian gland dysfunction   Conjunctiva/Sclera White and quiet Trace Injection  Cornea Arcus, 3+ edema w/ Descemet's folds, well healed temporal cataract wounds, mild endo pigment, 2+ Punctate epithelial erosions Arcus, 1-2+Punctate epithelial erosions, Well healed cataract wounds   Anterior Chamber Deep, 0.5+pigment Deep and quiet   Iris Round and poorly dilated to 4.0 mm Round and poorly dilated to 4.4m   Lens PC IOL in good position with open PC PC IOL in good position with open PC   Vitreous Vitreous syneresis, +RBC, mild diffuse VH, vitreous condensations, +pigment Vitreous syneresis, Posterior vitreous detachment         Fundus Exam       Right Left   Disc Hazy view, 3+pallor, +cupping, thin inf rim and temp PPA 1+ Pallor, Sharp rim, +cupping, temporal Peripapillary atrophy   C/D Ratio 0.6 0.6   Macula Hazy view from vitreous opacities and corneal edema -- grossly flat, +edema, central pigment clump, mild fibrosis Flat, good foveal reflex, RPE mottling and clumping, drusen, No heme or edema, focal cystic changes -- stably improved   Vessels attenuated, Tortuous Vascular attenuation, mild Tortuousity   Periphery Hazy view, grossly Attached, obscured by old VH and vitreous condensations Attached, No heme             IMAGING AND PROCEDURES  Imaging and Procedures for _0 @  OCT, Retina - OU - Both Eyes       Right Eye Quality was poor. Progression has been stable. Findings include (Poor image quality(only single vertical line scan obtained) -- grossly attached, no details visible).   Left Eye Quality was borderline. Central Foveal Thickness: 253. Progression has been stable. Findings include normal foveal contour, no SRF, retinal drusen , outer retinal atrophy, no IRF (stable improvement of focal IRF SN fovea, patchy ORA).   Notes *Images captured and stored on drive  Diagnosis / Impression:  OD: Poor image  quality(only single vertical line scan obtained) -- grossly attached--diffuse atrophy, no details visible OS: NFP, no SRF, +drusen -- stable improvement of focal IRF SN fovea, patchy ORA  Clinical management:  See below  Abbreviations: NFP - Normal foveal profile. CME - cystoid macular edema. PED - pigment epithelial detachment. IRF - intraretinal fluid. SRF - subretinal fluid. EZ - ellipsoid zone. ERM - epiretinal membrane. ORA - outer retinal atrophy. ORT - outer retinal tubulation. SRHM - subretinal hyper-reflective material            ASSESSMENT/PLAN:    ICD-10-CM   1. Branch retinal vein occlusion of right eye with macular edema  H34.8310 CANCELED: Intravitreal Injection, Pharmacologic Agent - OD - Right Eye    2. Retinal edema  H35.81 OCT, Retina - OU - Both Eyes    3. Vitreous hemorrhage of right eye ([place]  H43.11     4. Exudative age-related macular degeneration of right eye with active choroidal neovascularization ([place]  H35.3211     5. 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 Essential hypertension  I10     7. Hypertensive retinopathy of both eyes  H35.033     8. Primary open angle glaucoma (POAG) of both eyes, severe stage  H40.1133     9. Pseudophakia of both eyes  Z96.1     10. Dry eyes, bilateral  H04.123     11. Corneal edema  H18.20 sodium chloride (MURO 128) 5 % ophthalmic ointment     1-3. Inferior HRVO w/ CME and VH, OD  - pt lost to f/u from 10/16/2019 to 07/14/2020 (9 mos)  -  presenting BCVA CF 2' (09.14.20)  - pt subjectively reports decline in vision OD since Feb 2020  - presenting OCT shows severe CME/IRF temporal macula, +SRF overlying low PED (?exudative ARMD component), OCT today was poor image quality (only single vertical image obtained)  - S/p IVA OD #1 09.14.20, #2 (10.20.20), #3 (12.02.20), #4 (01.06.21), #5 (10.05.21), #6 (11.03.21), #7 (12.01.21), #8 (12.29.22), #9 (04.13.22), #10  (05.11.22), #11 (06.16.22), #12 (07.21.22)  - OCT -- Poor image quality due to old VH / vit condensations and corneal edema / haze  - discussed findings, poor prognosis and treatment options  - recommend holding IVA OD today due to corneal edema, adding Muro 128 ung (see below)  - RBA of procedure discussed, questions answered  - informed consent obtained and signed  - see procedure note  - f/u 2 weeks, DFE, OCT, possible injection  4. Exudative age related macular degeneration, OD    - initial OCT showed +IRF/SRF -- SRF overlying low lying PED  - original exam shows +CNVM with surrounding heme -- posterior exam impeded by old VH  - suspect exudative ARMD component complicating HRVO w/ CME  - S/p IVA OD #1 (09.14.20), #2 (10.20.20), #3 (12.02.20), #4 (01.06.21), #5 (10.05.21), #6 (11.03.21), #7 (12.01.21), #8 (12.29.22), #9 (04.13.22), #10 (05.11.22), #11 (06.16.22), #12 (07.12.22)  - recommend IVA OD #13 today as above  - f/u in 2 weeks -- DFE/OCT, possible injection  5. Age related macular degeneration, non-exudative, OS  - OCT shows stable improvement of focal cystic changes  - no heme or edema visible on exam  - recommend close observation   - cont [place] grid monitoring   - f/u 2 weeks  6,7. Hypertensive retinopathy OU  - discussed importance of tight BP control  - monitor   8. POAG OU -- severe stage  - under the expert management of Dr. Midge Aver  - s/p SLT OD 3.23.2017  - s/p TDC OD w/ [name], 3.30.21  - IOP good today at 12,14  - patient on cosopt bid OU, brimonidine tid OU  9. Pseudophakia OU  - s/p CE/IOL OU  - beautiful surgeries, doing well  - monitor   10. Dry eyes OU (OD > OS)  - recommend artificial tears and lubricating ointment as needed  11. Corneal edema OD  - 3+ Descemet folds and mild haze  - recommend treatment to improve monitoring of retina and posterior pole  - cont Lotemax SM OD TID  - add Muro 128 gtts QID OD  - add Muro 128 ung qhs  OD  - f/u 2 wks   Ophthalmic Meds Ordered this visit:  Meds ordered this encounter  Medications   sodium chloride (MURO 128) 5 % ophthalmic ointment    Sig: Place 1 application into the right eye at bedtime.    Dispense:  3.5 g    Refill:  12      Return in about 2 weeks (around 06/18/2021) for DFE, OCT.  There are no Patient Instructions on file for this visit.   Explained the diagnoses, plan, and follow up with the patient and they expressed understanding.  Patient expressed understanding of the importance of proper follow up care.   This document serves as a record of services personally performed by Gardiner Sleeper, MD, PhD. It was created on their behalf by Leonie Douglas, an ophthalmic technician. The creation of this record is the provider's dictation and/or activities during the visit.    Electronically signed by:  Leonie Douglas COA, 06/09/21  11:32 AM  This document serves as a record of services personally performed by Gardiner Sleeper, MD, PhD. It was created on their behalf by Roselee Nova, COMT. The creation of this record is the provider's dictation and/or activities during the visit.  Electronically signed by: Roselee Nova, COMT 06/09/21 11:32 AM  Gardiner Sleeper, M.D., Ph.D. Diseases & Surgery of the Retina and [place] 06/04/2021  I have reviewed the above documentation for accuracy and completeness, and I agree with the above. Gardiner Sleeper, M.D., Ph.D. 06/09/21 11:32 AM   Abbreviations: M myopia (nearsighted); A astigmatism; H hyperopia (farsighted); P presbyopia; Mrx spectacle prescription;  CTL contact lenses; OD right eye; OS left eye; OU both eyes  XT exotropia; ET esotropia; PEK punctate epithelial keratitis; PEE punctate epithelial erosions; DES dry eye syndrome; MGD meibomian gland dysfunction; ATs artificial tears; PFAT's preservative free artificial tears; [place] nuclear sclerotic cataract; PSC posterior subcapsular  cataract; ERM epi-retinal membrane; PVD posterior vitreous detachment; RD retinal detachment; DM diabetes mellitus; DR diabetic retinopathy; NPDR non-proliferative diabetic retinopathy; PDR proliferative diabetic retinopathy; CSME clinically significant macular edema; DME diabetic macular edema; dbh dot blot hemorrhages; CWS cotton wool spot; POAG primary open angle glaucoma; C/D cup-to-disc ratio; HVF humphrey visual field; GVF goldmann visual field; OCT optical coherence tomography; IOP intraocular pressure; BRVO Branch retinal vein occlusion; CRVO central retinal vein occlusion; CRAO central retinal artery occlusion; BRAO branch retinal artery occlusion; RT retinal tear; SB scleral buckle; PPV pars plana vitrectomy; VH Vitreous hemorrhage; PRP panretinal laser photocoagulation; IVK intravitreal kenalog; VMT vitreomacular traction; MH Macular hole;  NVD neovascularization of the disc; NVE neovascularization elsewhere; AREDS age related eye disease study; ARMD age related macular degeneration; POAG primary open angle glaucoma; EBMD epithelial/anterior basement membrane dystrophy; ACIOL anterior chamber intraocular lens; IOL intraocular lens; PCIOL posterior chamber intraocular lens; Phaco/IOL phacoemulsification with intraocular lens placement; [place] photorefractive keratectomy; LASIK laser assisted in situ keratomileusis; HTN hypertension; DM diabetes mellitus; COPD chronic obstructive pulmonary disease

## 2021-06-02 ENCOUNTER — Ambulatory Visit: Payer: Medicare Other | Admitting: Podiatry

## 2021-06-02 ENCOUNTER — Other Ambulatory Visit: Payer: Self-pay

## 2021-06-02 DIAGNOSIS — L97412 Non-pressure chronic ulcer of right heel and midfoot with fat layer exposed: Secondary | ICD-10-CM | POA: Diagnosis not present

## 2021-06-02 DIAGNOSIS — E08621 Diabetes mellitus due to underlying condition with foot ulcer: Secondary | ICD-10-CM

## 2021-06-02 NOTE — Progress Notes (Signed)
   Subjective:  87 y.o. female with PMHx of diabetes mellitus presenting today with her daughter for evaluation of a sore/callus that developed to the right plantar forefoot.  Patient has a history of calluses and lesions that developed recurrently to the right plantar forefoot.  She just received a new pair of diabetic insoles and shoes here in our office.  She presents today for further treatment and evaluation.  In the past when they developed wound they have used Medihoney and Iodosorb ointments with modest improvement.   Past Medical History:  Diagnosis Date   Anemia    Arthritis    Glaucoma    OU   Gout, joint    Hypertension    Hypertensive retinopathy    OU   Macular degeneration    OU   NSTEMI (non-ST elevated myocardial infarction) ([facility]) 08/2015   Stroke [facility])       Objective/Physical Exam General: The patient is alert and oriented x3 in no acute distress.  Dermatology:  Wound #1 noted to the right plantar forefoot measuring approximately 1.0 x 1.0 x 0.1 cm (LxWxD).   To the noted ulceration(s), there is no eschar. There is a moderate amount of slough, fibrin, and necrotic tissue noted. Granulation tissue and wound base is red. There is a minimal amount of serosanguineous drainage noted. There is no exposed bone muscle-tendon ligament or joint. There is no malodor. Periwound integrity is intact. Skin is warm, dry and supple bilateral lower extremities.  Vascular: Palpable pedal pulses bilaterally. No edema or erythema noted. Capillary refill within normal limits.  Neurological: Epicritic and protective threshold diminished bilaterally.   Musculoskeletal Exam: Fat pad atrophy with tenderness to palpation right forefoot.  Palpation of the metatarsal heads noted specifically to the subsecond and third MTPJ  Assessment: 1.  Ulcer right plantar forefoot secondary to diabetes mellitus 2. diabetes mellitus w/ peripheral neuropathy   Plan of Care:  1. Patient was  evaluated. 2. medically necessary excisional debridement including subcutaneous tissue was performed using a tissue nipper and a chisel blade. Excisional debridement of all the necrotic nonviable tissue down to healthy bleeding viable tissue was performed with post-debridement measurements same as pre-. 3. the wound was cleansed and dry sterile dressing applied. 4.  Offloading felt pad was applied to the diabetic insole of the right shoe to offload pressure from the wound  5.  Silvadene cream was provided.  Recommend Silvadene cream and a light dressing daily  6.  Patient is to return to clinic in 3 weeks   Felecia Shelling, DPM [facility]  Dr. Felecia Shelling, DPM    [address] [place], [place] [address]                Office [phone]  Fax [phone]

## 2021-06-03 DIAGNOSIS — M1711 Unilateral primary osteoarthritis, right knee: Secondary | ICD-10-CM | POA: Diagnosis not present

## 2021-06-04 ENCOUNTER — Ambulatory Visit (INDEPENDENT_AMBULATORY_CARE_PROVIDER_SITE_OTHER): Payer: Medicare Other | Admitting: Ophthalmology

## 2021-06-04 ENCOUNTER — Other Ambulatory Visit: Payer: Self-pay

## 2021-06-04 ENCOUNTER — Encounter (INDEPENDENT_AMBULATORY_CARE_PROVIDER_SITE_OTHER): Payer: Self-pay | Admitting: Ophthalmology

## 2021-06-04 DIAGNOSIS — H3581 Retinal edema: Secondary | ICD-10-CM

## 2021-06-04 DIAGNOSIS — H353211 Exudative age-related macular degeneration, right eye, with active choroidal neovascularization: Secondary | ICD-10-CM

## 2021-06-04 DIAGNOSIS — I10 Essential (primary) hypertension: Secondary | ICD-10-CM

## 2021-06-04 DIAGNOSIS — H353122 Nonexudative age-related macular degeneration, left eye, intermediate dry stage: Secondary | ICD-10-CM | POA: Diagnosis not present

## 2021-06-04 DIAGNOSIS — H34831 Tributary (branch) retinal vein occlusion, right eye, with macular edema: Secondary | ICD-10-CM

## 2021-06-04 DIAGNOSIS — H1820 Unspecified corneal edema: Secondary | ICD-10-CM

## 2021-06-04 DIAGNOSIS — Z961 Presence of intraocular lens: Secondary | ICD-10-CM

## 2021-06-04 DIAGNOSIS — H35033 Hypertensive retinopathy, bilateral: Secondary | ICD-10-CM

## 2021-06-04 DIAGNOSIS — H4311 Vitreous hemorrhage, right eye: Secondary | ICD-10-CM | POA: Diagnosis not present

## 2021-06-04 DIAGNOSIS — H04123 Dry eye syndrome of bilateral lacrimal glands: Secondary | ICD-10-CM

## 2021-06-04 DIAGNOSIS — H401133 Primary open-angle glaucoma, bilateral, severe stage: Secondary | ICD-10-CM

## 2021-06-04 MED ORDER — SODIUM CHLORIDE (HYPERTONIC) 5 % OP OINT: 1.0000 "application " | TOPICAL_OINTMENT | Freq: Every day | OPHTHALMIC | 12 refills | Status: DC

## 2021-06-08 NOTE — Progress Notes (Signed)
HPI: Follow-up coronary artery disease. Carotid Dopplers October 2016 showed no significant stenosis. Admitted November 2016 and ruled in for a non-ST elevation myocardial infarction. Cardiac catheterization revealed a 95% distal RCA. Patient had a drug-eluting stent. Procedure complicated by CVA. Echo November 2016 showed normal LV function, grade 1 diastolic dysfunction, mild mitral regurgitation, moderate tricuspid regurgitation and moderate to severe pulmonary hypertension. ABIs September 2020 normal.  Since last seen, she denies dyspnea, chest pain, palpitations or syncope.  She has chronic pedal edema.  Current Outpatient Medications  Medication Sig Dispense Refill   Acetaminophen 325 MG CAPS Take 325 mg by mouth 3 (three) times daily as needed.     aspirin 81 MG chewable tablet Chew 81 mg by mouth daily.     atorvastatin (LIPITOR) 80 MG tablet Take 1 tablet (80 mg total) by mouth daily at 6 PM. 60 tablet 0   brimonidine (ALPHAGAN) 0.2 % ophthalmic solution brimonidine 0.2 % eye drops  INSTILL 1 DROP INTO BOTH EYES TWICE A DAY     Calcium Carbonate-Vitamin D (CALTRATE 600+D PO) Take 1 tablet by mouth 2 (two) times daily.     cephALEXin (KEFLEX) 250 MG capsule Take 250 mg by mouth at bedtime.     chlorpheniramine (CHLOR-TRIMETON) 4 MG tablet Take 4 mg by mouth every 4 (four) hours as needed for allergies.     denosumab (PROLIA) 60 MG/ML SOSY injection See admin instructions.     diclofenac sodium (VOLTAREN) 1 % GEL diclofenac 1 % topical gel  AS DIRECTED TWICE A DAY AS NEEDED TRANSDERMAL 14 DAYS     dorzolamide-timolol (COSOPT) 22.3-6.8 MG/ML ophthalmic solution Place 1 drop into both eyes 2 (two) times daily.      famotidine (PEPCID) 20 MG tablet Take 20 mg by mouth daily.     FLUZONE HIGH-DOSE QUADRIVALENT 0.7 ML SUSY      furosemide (LASIX) 20 MG tablet Take 20 mg by mouth as needed. Every other day as of 3/17 due to high K     latanoprost (XALATAN) 0.005 % ophthalmic solution  latanoprost 0.005 % eye drops  INSTILL 1 DROP INTO RIGHT EYE AT BEDTIME     Loteprednol Etabonate (LOTEMAX SM) 0.38 % GEL Place 1 drop into the right eye 3 (three) times daily. 5 g 3   metoprolol (LOPRESSOR) 50 MG tablet Take 25 mg by mouth 2 (two) times daily.     Multiple Vitamins-Minerals (MULTIPLE VITAMINS/WOMENS PO) Take 1 tablet by mouth daily. W/vitamin D     Polyethyl Glycol-Propyl Glycol (SYSTANE OP) Apply 1 drop to eye 2 (two) times daily.     sodium chloride (MURO 128) 2 % ophthalmic solution Place 1 drop into the right eye 4 (four) times daily. 15 mL 1   sodium chloride (MURO 128) 5 % ophthalmic ointment Place 1 application into the right eye at bedtime. 3.5 g 12   No current facility-administered medications for this visit.     Past Medical History:  Diagnosis Date   Anemia    Arthritis    Glaucoma    OU   Gout, joint    Hypertension    Hypertensive retinopathy    OU   Macular degeneration    OU   NSTEMI (non-ST elevated myocardial infarction) ([facility]) 08/2015   Stroke [facility])     Past Surgical History:  Procedure Laterality Date   ABDOMINAL HYSTERECTOMY     BREAST SURGERY     CYST   CARDIAC CATHETERIZATION N/A 08/18/2015  Procedure: Left Heart Cath and Coronary Angiography;  Surgeon: Lennette Bihari, MD;  Location: [facility];  Service: Cardiovascular;  Laterality: N/A;   CARDIAC CATHETERIZATION  08/18/2015   Procedure: Coronary Stent Intervention;  Surgeon: Lennette Bihari, MD;  Location: [facility];  Service: Cardiovascular;;   CATARACT EXTRACTION Bilateral    DILATION AND CURETTAGE OF UTERUS     EYE SURGERY Bilateral    HERNIA REPAIR     IRIDOTOMY / IRIDECTOMY Right    JOINT REPLACEMENT  left knee   REFRACTIVE SURGERY Right 2017    Social History   Socioeconomic History   Marital status: Divorced    Spouse name: Not on file   Number of children: 3   Years of education: 12   Highest education level: Not on file  Occupational History     Comment: retired  Tobacco Use   Smoking status: Never   Smokeless tobacco: Never  Substance and Sexual Activity   Alcohol use: No    Alcohol/week: 0.0 standard drinks   Drug use: No   Sexual activity: Never    Birth control/protection: Post-menopausal  Other Topics Concern   Not on file  Social History Narrative   Patient is single and lives at home alone.   Retired   Education 12th grade    Right handed   Caffeine  sometimes   Social Determinants of Corporate investment banker Strain: Not on file  Food Insecurity: Not on file  Transportation Needs: Not on file  Physical Activity: Not on file  Stress: Not on file  Social Connections: Not on file  Intimate Partner Violence: Not on file    Family History  Problem Relation Age of Onset   Heart disease Mother    Stroke Mother     ROS: no fevers or chills, productive cough, hemoptysis, dysphasia, odynophagia, melena, hematochezia, dysuria, hematuria, rash, seizure activity, orthopnea, PND, pedal edema, claudication. Remaining systems are negative.  Physical Exam: Well-developed well-nourished in no acute distress.  Skin is warm and dry.  HEENT is normal.  Neck is supple.  Chest is clear to auscultation with normal expansion.  Cardiovascular exam is regular rate and rhythm.  3/6 systolic murmur left sternal border. Abdominal exam nontender or distended. No masses palpated. Extremities show 1+ edema. neuro grossly intact  ECG-sinus rhythm at a rate of 60, right bundle branch block, inferior infarct.  Personally reviewed  A/P  1 coronary artery disease-patient denies chest pain.  Plan to continue medical therapy with aspirin and statin.  Given patient's age we are treating conservatively.  Note she is no CODE BLUE.  2 history of aortic stenosis-not evident on last echocardiogram.  Significant murmur on examination.  As outlined previously patient is 73 and would not consider procedures in the future including TAVR.  We  will therefore not pursue follow-up echocardiograms.  3 hypertension-blood pressure controlled.  Continue present medical regimen.  4 hyperlipidemia-continue statin.  5 lower extremity edema-continue diuretics.   Olga Millers, MD

## 2021-06-09 ENCOUNTER — Encounter (INDEPENDENT_AMBULATORY_CARE_PROVIDER_SITE_OTHER): Payer: Self-pay | Admitting: Ophthalmology

## 2021-06-10 DIAGNOSIS — R350 Frequency of micturition: Secondary | ICD-10-CM | POA: Diagnosis not present

## 2021-06-17 NOTE — Progress Notes (Signed)
[facility] - Clinic Note  06/18/2021     CHIEF COMPLAINT Patient presents for Retina Follow Up   HISTORY OF PRESENT ILLNESS: [name] is a 87 y.o. female who presents to the clinic today for:  HPI     Retina Follow Up   Patient presents with  CRVO/BRVO.  In right eye.  This started weeks ago.  Severity is moderate.  Duration of weeks.  Since onset it is stable.  I, the attending physician,  performed the HPI with the patient and updated documentation appropriately.        Comments   Pt states vision is the same OU.  Pts Daughter reports drop compliance.  Pt denies eye pain or discomfort.  Denies any new or worsening floaters or fol OU.      Last edited by Rennis ChrisZamora, Lugene Beougher, MD on 06/19/2021  1:15 AM.    Pts daughter states right eye is not doing well, she is using Lotemax SM OD TID, Muro 128 gtts QID OD and Muro 128 ung qhs OD  Referring physician: Irena Reichmannollins, Dana, DO [address] [place] [id number]  HISTORICAL INFORMATION:   Selected notes from the MEDICAL RECORD NUMBER Referred by Dr. Marchelle Gearinghris Groat for concern of RVO OS   CURRENT MEDICATIONS: Current Outpatient Medications (Ophthalmic Drugs)  Medication Sig   brimonidine (ALPHAGAN) 0.2 % ophthalmic solution brimonidine 0.2 % eye drops  INSTILL 1 DROP INTO BOTH EYES TWICE A DAY   dorzolamide-timolol (COSOPT) 22.3-6.8 MG/ML ophthalmic solution Place 1 drop into both eyes 2 (two) times daily.    latanoprost (XALATAN) 0.005 % ophthalmic solution latanoprost 0.005 % eye drops  INSTILL 1 DROP INTO RIGHT EYE AT BEDTIME   Loteprednol Etabonate (LOTEMAX SM) 0.38 % GEL Place 1 drop into the right eye 3 (three) times daily.   Polyethyl Glycol-Propyl Glycol (SYSTANE OP) Apply 1 drop to eye 2 (two) times daily.   sodium chloride (MURO 128) 2 % ophthalmic solution Place 1 drop into the right eye 4 (four) times daily.   sodium chloride (MURO 128) 5 % ophthalmic ointment Place 1 application into  the right eye at bedtime.   No current facility-administered medications for this visit. (Ophthalmic Drugs)   Current Outpatient Medications (Other)  Medication Sig   Acetaminophen 325 MG CAPS Take 325 mg by mouth 3 (three) times daily as needed.   aspirin 81 MG chewable tablet Chew 81 mg by mouth daily.   atorvastatin (LIPITOR) 80 MG tablet Take 1 tablet (80 mg total) by mouth daily at 6 PM.   Calcium Carbonate-Vitamin D (CALTRATE 600+D PO) Take 1 tablet by mouth 2 (two) times daily.   cephALEXin (KEFLEX) 250 MG capsule Take 250 mg by mouth at bedtime.   chlorpheniramine (CHLOR-TRIMETON) 4 MG tablet Take 4 mg by mouth every 4 (four) hours as needed for allergies.   denosumab (PROLIA) 60 MG/ML SOSY injection See admin instructions.   diclofenac sodium (VOLTAREN) 1 % GEL diclofenac 1 % topical gel  AS DIRECTED TWICE A DAY AS NEEDED TRANSDERMAL 14 DAYS   famotidine (PEPCID) 20 MG tablet Take 20 mg by mouth daily.   FLUZONE HIGH-DOSE QUADRIVALENT 0.7 ML SUSY    furosemide (LASIX) 20 MG tablet Take 20 mg by mouth as needed. Every other day as of 3/17 due to high K   metoprolol (LOPRESSOR) 50 MG tablet Take 25 mg by mouth 2 (two) times daily.   Multiple Vitamins-Minerals (MULTIPLE VITAMINS/WOMENS PO) Take  1 tablet by mouth daily. W/vitamin D   No current facility-administered medications for this visit. (Other)    REVIEW OF SYSTEMS: ROS   Positive for: Cardiovascular, Eyes, Respiratory Negative for: Constitutional, Gastrointestinal, Neurological, Skin, Genitourinary, Musculoskeletal, HENT, Endocrine, Psychiatric, Allergic/Imm, Heme/Lymph Last edited by Corrinne Eagle on 06/18/2021  8:46 AM.       ALLERGIES Allergies  Allergen Reactions   Meloxicam Hives   Other Other (See Comments)   Guaifenesin Er Rash    Other reaction(s): rash   Nitrofuran Derivatives Rash    PAST MEDICAL HISTORY Past Medical History:  Diagnosis Date   Anemia    Arthritis    Glaucoma    OU   Gout,  joint    Hypertension    Hypertensive retinopathy    OU   Macular degeneration    OU   NSTEMI (non-ST elevated myocardial infarction) (HCC) 08/2015   Stroke [facility])    Past Surgical History:  Procedure Laterality Date   ABDOMINAL HYSTERECTOMY     BREAST SURGERY     CYST   CARDIAC CATHETERIZATION N/A 08/18/2015   Procedure: Left Heart Cath and Coronary Angiography;  Surgeon: Lennette Bihari, MD;  Location: [facility] INVASIVE CV LAB;  Service: Cardiovascular;  Laterality: N/A;   CARDIAC CATHETERIZATION  08/18/2015   Procedure: Coronary Stent Intervention;  Surgeon: Lennette Bihari, MD;  Location: [facility] INVASIVE CV LAB;  Service: Cardiovascular;;   CATARACT EXTRACTION Bilateral    DILATION AND CURETTAGE OF UTERUS     EYE SURGERY Bilateral    HERNIA REPAIR     IRIDOTOMY / IRIDECTOMY Right    JOINT REPLACEMENT  left knee   REFRACTIVE SURGERY Right 2017    FAMILY HISTORY Family History  Problem Relation Age of Onset   Heart disease Mother    Stroke Mother     SOCIAL HISTORY Social History   Tobacco Use   Smoking status: Never   Smokeless tobacco: Never  Substance Use Topics   Alcohol use: No    Alcohol/week: 0.0 standard drinks   Drug use: No         OPHTHALMIC EXAM:  Base Eye Exam     Visual Acuity (Snellen - Linear)       Right Left   Dist [place] CF @ face 20/30 -2   Dist ph [place] NI NI         Tonometry (Tonopen, 8:44 AM)       Right Left   Pressure 10 15         Pupils       Dark Light Shape React APD   Right 2 1 Round Minimal 0   Left 2 1 Round Minimal 0  K Haze OD        Visual Fields       Left Right    Full Full         Extraocular Movement       Right Left    Full Full         Neuro/Psych     Oriented x3: Yes   Mood/Affect: Normal         Dilation     Both eyes: 1.0% Mydriacyl, 2.5% Phenylephrine @ 8:45 AM           Slit Lamp and Fundus Exam     Slit Lamp Exam       Right Left   Lids/Lashes Dermatochalasis - upper lid,  mild Telangiectasia Dermatochalasis - upper lid, mild  Meibomian gland dysfunction   Conjunctiva/Sclera White and quiet Trace Injection   Cornea Arcus, 3+ edema w/ Descemet's folds, well healed temporal cataract wounds, mild endo pigment, 2+ Punctate epithelial erosions Arcus, 1-2+Punctate epithelial erosions, Well healed cataract wounds   Anterior Chamber Deep, 0.5+pigment Deep and quiet   Iris Round and poorly dilated to 4.0 mm Round and poorly dilated to 4.46mm   Lens PC IOL in good position with open PC PC IOL in good position with open PC   Vitreous Vitreous syneresis, +RBC, mild diffuse VH, vitreous condensations, +pigment Vitreous syneresis, Posterior vitreous detachment         Fundus Exam       Right Left   Disc Hazy view, 3+pallor, +cupping, thin inf rim and temp PPA 1+ Pallor, Sharp rim, +cupping, temporal Peripapillary atrophy   C/D Ratio 0.6 0.6   Macula Hazy view from vitreous opacities and corneal edema -- grossly flat, +edema, central pigment clump, mild fibrosis Flat, good foveal reflex, RPE mottling and clumping, drusen, No heme or edema, focal cystic changes -- stably improved   Vessels attenuated, Tortuous Vascular attenuation, mild Tortuousity   Periphery Hazy view, grossly Attached, obscured by old VH and vitreous condensations Attached, No heme            Refraction     Wearing Rx       Sphere Cylinder Axis   Right -1.50 +2.25 173   Left -0.75 +2.25 166            IMAGING AND PROCEDURES  Imaging and Procedures for @  OCT, Retina - OU - Both Eyes       Right Eye Quality was poor. Progression has been stable. Findings include (Poor image quality(only single horizontal line scan obtained) -- grossly attached, no details visible).   Left Eye Quality was borderline. Central Foveal Thickness: 250. Progression has been stable. Findings include normal foveal contour, no SRF, retinal drusen , outer retinal atrophy, no IRF (stable improvement of focal  IRF SN fovea, patchy ORA).   Notes *Images captured and stored on drive  Diagnosis / Impression:  OD: Poor image quality(only single vertical line scan obtained) -- grossly attached--diffuse atrophy, no details visible OS: NFP, no SRF, +drusen -- stable improvement of focal IRF SN fovea, patchy ORA  Clinical management:  See below  Abbreviations: NFP - Normal foveal profile. CME - cystoid macular edema. PED - pigment epithelial detachment. IRF - intraretinal fluid. SRF - subretinal fluid. EZ - ellipsoid zone. ERM - epiretinal membrane. ORA - outer retinal atrophy. ORT - outer retinal tubulation. SRHM - subretinal hyper-reflective material             ASSESSMENT/PLAN:    ICD-10-CM   1. Branch retinal vein occlusion of right eye with macular edema  H34.8310     2. Retinal edema  H35.81 OCT, Retina - OU - Both Eyes    3. Vitreous hemorrhage of right eye (HCC)  H43.11     4. Exudative age-related macular degeneration of right eye with active choroidal neovascularization (HCC)  H35.3211     5. 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 Essential hypertension  I10     7. Hypertensive retinopathy of both eyes  H35.033     8. Primary open angle glaucoma (POAG) of both eyes, severe stage  H40.1133     9. Pseudophakia of both eyes  Z96.1     10. Dry eyes, bilateral  D66.440     11. Corneal edema  H18.20       1-3. Inferior HRVO w/ CME and VH, OD  - pt lost to f/u from 10/16/2019 to 07/14/2020 (9 mos)  - presenting BCVA CF 2' (09.14.20)  - pt subjectively reports decline in vision OD since Feb 2020  - presenting OCT shows severe CME/IRF temporal macula, +SRF overlying low PED (?exudative ARMD component), OCT today was poor image quality (only single vertical image obtained)  - S/p IVA OD #1 09.14.20, #2 (10.20.20), #3 (12.02.20), #4 (01.06.21), #5 (10.05.21), #6 (11.03.21), #7 (12.01.21), #8 (12.29.22), #9 (04.13.22), #10  (05.11.22), #11 (06.16.22), #12 (07.21.22)  - OCT -- Poor image quality due to old VH / vit condensations and corneal edema / haze  - discussed findings, poor prognosis and treatment options  - recommend holding IVA OD today due to corneal edema (see below)  - f/u 2 weeks, DFE, OCT, possible injection  4. Exudative age related macular degeneration, OD    - initial OCT showed +IRF/SRF -- SRF overlying low lying PED  - original exam shows +CNVM with surrounding heme -- posterior exam impeded by old VH  - suspect exudative ARMD component complicating HRVO w/ CME  - S/p IVA OD #1 (09.14.20), #2 (10.20.20), #3 (12.02.20), #4 (01.06.21), #5 (10.05.21), #6 (11.03.21), #7 (12.01.21), #8 (12.29.22), #9 (04.13.22), #10 (05.11.22), #11 (06.16.22), #12 (07.12.22)  - recommend  holding IVA OD today as above  - f/u in 2 weeks -- DFE/OCT, possible injection  5. Age related macular degeneration, non-exudative, OS  - OCT shows stable improvement of focal cystic changes  - no heme or edema visible on exam  - recommend close observation   - cont Amsler grid monitoring   - f/u 2 weeks  6,7. Hypertensive retinopathy OU  - discussed importance of tight BP control  - monitor   8. POAG OU -- severe stage  - under the expert management of Dr. Marchelle Gearing  - s/p SLT OD 3.23.2017  - s/p TDC OD w/ Dr. Loraine Grip, 3.30.21  - IOP good today at 10, 15  - patient on cosopt bid OU, brimonidine tid OU  9. Pseudophakia OU  - s/p CE/IOL OU  - beautiful surgeries, doing well  - monitor   10. Dry eyes OU (OD > OS)  - recommend artificial tears and lubricating ointment as needed  11. Corneal edema OD  - 3+ Descemet folds and mild haze  - recommend treatment to improve monitoring of retina and posterior pole  - inc Lotemax SM OD  to 6x/day  - cont Muro 128 gtts QID OD  - cont Muro 128 ung qhs OD  - f/u 2 wks   Ophthalmic Meds Ordered this visit:  No orders of the defined types were placed in this encounter.      Return in about 2 weeks (around 07/02/2021) for f/u K check OD, DFE, OCT.  There are no Patient Instructions on file for this visit.   Explained the diagnoses, plan, and follow up with the patient and they expressed understanding.  Patient expressed understanding of the importance of proper follow up care.   This document serves as a record of services personally performed by Karie Chimera, MD, PhD. It was created on their behalf by Joni Reining, an ophthalmic technician. The creation of this record is the provider's dictation and/or activities during the visit.    Electronically signed by: Joni Reining COA, 06/19/21  1:16 AM   Arlys John  Wynona Neat, M.D., Ph.D. Diseases & Surgery of the Retina and Vitreous [facility] 06/18/2021  I have reviewed the above documentation for accuracy and completeness, and I agree with the above. Karie Chimera, M.D., Ph.D. 06/19/21 1:18 AM  Abbreviations: M myopia (nearsighted); A astigmatism; H hyperopia (farsighted); P presbyopia; Mrx spectacle prescription;  CTL contact lenses; OD right eye; OS left eye; OU both eyes  XT exotropia; ET esotropia; PEK punctate epithelial keratitis; PEE punctate epithelial erosions; DES dry eye syndrome; MGD meibomian gland dysfunction; ATs artificial tears; PFAT's preservative free artificial tears; NSC nuclear sclerotic cataract; PSC posterior subcapsular cataract; ERM epi-retinal membrane; PVD posterior vitreous detachment; RD retinal detachment; DM diabetes mellitus; DR diabetic retinopathy; NPDR non-proliferative diabetic retinopathy; PDR proliferative diabetic retinopathy; CSME clinically significant macular edema; DME diabetic macular edema; dbh dot blot hemorrhages; CWS cotton wool spot; POAG primary open angle glaucoma; C/D cup-to-disc ratio; HVF humphrey visual field; GVF goldmann visual field; OCT optical coherence tomography; IOP intraocular pressure; BRVO Branch retinal vein occlusion; CRVO  central retinal vein occlusion; CRAO central retinal artery occlusion; BRAO branch retinal artery occlusion; RT retinal tear; SB scleral buckle; PPV pars plana vitrectomy; VH Vitreous hemorrhage; PRP panretinal laser photocoagulation; IVK intravitreal kenalog; VMT vitreomacular traction; MH Macular hole;  NVD neovascularization of the disc; NVE neovascularization elsewhere; AREDS age related eye disease study; ARMD age related macular degeneration; POAG primary open angle glaucoma; EBMD epithelial/anterior basement membrane dystrophy; ACIOL anterior chamber intraocular lens; IOL intraocular lens; PCIOL posterior chamber intraocular lens; Phaco/IOL phacoemulsification with intraocular lens placement; PRK photorefractive keratectomy; LASIK laser assisted in situ keratomileusis; HTN hypertension; DM diabetes mellitus; COPD chronic obstructive pulmonary disease

## 2021-06-18 ENCOUNTER — Encounter (INDEPENDENT_AMBULATORY_CARE_PROVIDER_SITE_OTHER): Payer: Self-pay | Admitting: Ophthalmology

## 2021-06-18 ENCOUNTER — Encounter (INDEPENDENT_AMBULATORY_CARE_PROVIDER_SITE_OTHER): Payer: Medicare Other | Admitting: Ophthalmology

## 2021-06-18 ENCOUNTER — Other Ambulatory Visit: Payer: Self-pay

## 2021-06-18 ENCOUNTER — Ambulatory Visit (INDEPENDENT_AMBULATORY_CARE_PROVIDER_SITE_OTHER): Payer: Medicare Other | Admitting: Ophthalmology

## 2021-06-18 DIAGNOSIS — H401133 Primary open-angle glaucoma, bilateral, severe stage: Secondary | ICD-10-CM | POA: Diagnosis not present

## 2021-06-18 DIAGNOSIS — H1820 Unspecified corneal edema: Secondary | ICD-10-CM

## 2021-06-18 DIAGNOSIS — H34831 Tributary (branch) retinal vein occlusion, right eye, with macular edema: Secondary | ICD-10-CM | POA: Diagnosis not present

## 2021-06-18 DIAGNOSIS — H3581 Retinal edema: Secondary | ICD-10-CM

## 2021-06-18 DIAGNOSIS — H4311 Vitreous hemorrhage, right eye: Secondary | ICD-10-CM | POA: Diagnosis not present

## 2021-06-18 DIAGNOSIS — Z961 Presence of intraocular lens: Secondary | ICD-10-CM | POA: Diagnosis not present

## 2021-06-18 DIAGNOSIS — H353122 Nonexudative age-related macular degeneration, left eye, intermediate dry stage: Secondary | ICD-10-CM | POA: Diagnosis not present

## 2021-06-18 DIAGNOSIS — H35033 Hypertensive retinopathy, bilateral: Secondary | ICD-10-CM

## 2021-06-18 DIAGNOSIS — H353211 Exudative age-related macular degeneration, right eye, with active choroidal neovascularization: Secondary | ICD-10-CM

## 2021-06-18 DIAGNOSIS — H04123 Dry eye syndrome of bilateral lacrimal glands: Secondary | ICD-10-CM

## 2021-06-18 DIAGNOSIS — I10 Essential (primary) hypertension: Secondary | ICD-10-CM | POA: Diagnosis not present

## 2021-06-22 ENCOUNTER — Other Ambulatory Visit: Payer: Self-pay

## 2021-06-22 ENCOUNTER — Encounter: Payer: Self-pay | Admitting: Cardiology

## 2021-06-22 ENCOUNTER — Ambulatory Visit: Payer: Medicare Other | Admitting: Cardiology

## 2021-06-22 VITALS — BP 130/62 | HR 60 | Ht 60.0 in | Wt 134.0 lb

## 2021-06-22 DIAGNOSIS — I2510 Atherosclerotic heart disease of native coronary artery without angina pectoris: Secondary | ICD-10-CM | POA: Diagnosis not present

## 2021-06-22 DIAGNOSIS — R600 Localized edema: Secondary | ICD-10-CM | POA: Diagnosis not present

## 2021-06-22 DIAGNOSIS — E7800 Pure hypercholesterolemia, unspecified: Secondary | ICD-10-CM

## 2021-06-22 DIAGNOSIS — I10 Essential (primary) hypertension: Secondary | ICD-10-CM

## 2021-06-22 NOTE — Patient Instructions (Signed)
Medication Instructions:  The current medical regimen is effective;  continue present plan and medications.  *If you need a refill on your cardiac medications before your next appointment, please call your pharmacy*   Follow-Up: At [facility], you and your health needs are our priority.  As part of our continuing mission to provide you with exceptional heart care, we have created designated Provider Care Teams.  These Care Teams include your primary Cardiologist (physician) and Advanced Practice Providers (APPs -  Physician Assistants and Nurse Practitioners) who all work together to provide you with the care you need, when you need it.  We recommend signing up for the patient portal called "MyChart".  Sign up information is provided on this After Visit Summary.  MyChart is used to connect with patients for Virtual Visits (Telemedicine).  Patients are able to view lab/test results, encounter notes, upcoming appointments, etc.  Non-urgent messages can be sent to your provider as well.   To learn more about what you can do with MyChart, go to [email or web address].    Your next appointment:   12 month(s)  The format for your next appointment:   In Person  Provider:   Olga Millers, MD

## 2021-06-23 ENCOUNTER — Ambulatory Visit: Payer: Medicare Other | Admitting: Podiatry

## 2021-06-23 DIAGNOSIS — L97412 Non-pressure chronic ulcer of right heel and midfoot with fat layer exposed: Secondary | ICD-10-CM | POA: Diagnosis not present

## 2021-06-23 DIAGNOSIS — E08621 Diabetes mellitus due to underlying condition with foot ulcer: Secondary | ICD-10-CM | POA: Diagnosis not present

## 2021-06-23 NOTE — Progress Notes (Signed)
   Subjective:  87 y.o. female with PMHx of diabetes mellitus presenting today with her daughter for follow-up evaluation of a sore/callus that developed to the right plantar forefoot.  Patient has a history of calluses and lesions that developed recurrently to the right plantar forefoot.  Patient states that there is significant improvement in reduction in pain.  They have been applying the Silvadene cream as instructed.  No new complaints at this time   Past Medical History:  Diagnosis Date   Anemia    Arthritis    Glaucoma    OU   Gout, joint    Hypertension    Hypertensive retinopathy    OU   Macular degeneration    OU   NSTEMI (non-ST elevated myocardial infarction) (HCC) 08/2015   Stroke [facility])       Objective/Physical Exam General: The patient is alert and oriented x3 in no acute distress.  Dermatology:  Wound #1 noted to the right plantar forefoot measuring approximately 0.5x0.6 x 0.1 cm (LxWxD).   To the noted ulceration(s), there is no eschar. There is a moderate amount of slough, fibrin, and necrotic tissue noted. Granulation tissue and wound base is red. There is a minimal amount of serosanguineous drainage noted. There is no exposed bone muscle-tendon ligament or joint. There is no malodor. Periwound integrity is intact. Skin is warm, dry and supple bilateral lower extremities.  Vascular: Palpable pedal pulses bilaterally. No edema or erythema noted. Capillary refill within normal limits.  Neurological: Epicritic and protective threshold diminished bilaterally.   Musculoskeletal Exam: Fat pad atrophy with tenderness to palpation right forefoot.  Palpation of the metatarsal heads noted specifically to the subsecond and third MTPJ  Assessment: 1.  Ulcer right plantar forefoot secondary to diabetes mellitus 2. diabetes mellitus w/ peripheral neuropathy   Plan of Care:  1. Patient was evaluated.  Overall there is significant improvement to the wound.  We will  continue the same regimen 2. medically necessary excisional debridement including subcutaneous tissue was performed using a tissue nipper and a chisel blade. Excisional debridement of all the necrotic nonviable tissue down to healthy bleeding viable tissue was performed with post-debridement measurements same as pre-. 3. the wound was cleansed and dry sterile dressing applied. 4.  Continue offloading felt pad to the insoles of the diabetic shoe right foot 5.  Continue Silvadene cream daily 6.  Patient is to return to clinic in 3 weeks   [name], DPM [facility]  Dr. Felecia [name], DPM    [address] [place], [place] [address]                Office [phone]  Fax [phone]

## 2021-06-24 DIAGNOSIS — L570 Actinic keratosis: Secondary | ICD-10-CM | POA: Diagnosis not present

## 2021-06-29 ENCOUNTER — Ambulatory Visit: Payer: Medicare Other | Admitting: Cardiology

## 2021-06-29 DIAGNOSIS — M7541 Impingement syndrome of right shoulder: Secondary | ICD-10-CM | POA: Diagnosis not present

## 2021-06-29 DIAGNOSIS — M7542 Impingement syndrome of left shoulder: Secondary | ICD-10-CM | POA: Diagnosis not present

## 2021-06-29 NOTE — Progress Notes (Signed)
[facility] - Clinic Note  07/02/2021     CHIEF COMPLAINT Patient presents for Retina Follow Up   HISTORY OF PRESENT ILLNESS: [name] is a 87 y.o. female who presents to the clinic today for:  HPI     Retina Follow Up   Patient presents with  CRVO/BRVO.  In right eye.  This started weeks ago.  Severity is moderate.  Duration of weeks.  Since onset it is stable.  I, the attending physician,  performed the HPI with the patient and updated documentation appropriately.      Last edited by Rennis Chris, MD on 07/05/2021  8:45 PM.    Pt states vision is stable  Referring physician: Irena Reichmann, DO [address] [place],  [place] [address]  HISTORICAL INFORMATION:   Selected notes from the MEDICAL RECORD NUMBER Referred by Dr. Marchelle Gearing for concern of RVO OS   CURRENT MEDICATIONS: Current Outpatient Medications (Ophthalmic Drugs)  Medication Sig   brimonidine (ALPHAGAN) 0.2 % ophthalmic solution brimonidine 0.2 % eye drops  INSTILL 1 DROP INTO BOTH EYES TWICE A DAY   dorzolamide-timolol (COSOPT) 22.3-6.8 MG/ML ophthalmic solution Place 1 drop into both eyes 2 (two) times daily.    latanoprost (XALATAN) 0.005 % ophthalmic solution latanoprost 0.005 % eye drops  INSTILL 1 DROP INTO RIGHT EYE AT BEDTIME   Loteprednol Etabonate (LOTEMAX SM) 0.38 % GEL Place 1 drop into the right eye 3 (three) times daily.   Polyethyl Glycol-Propyl Glycol (SYSTANE OP) Apply 1 drop to eye 2 (two) times daily.   sodium chloride (MURO 128) 2 % ophthalmic solution Place 1 drop into the right eye 4 (four) times daily.   sodium chloride (MURO 128) 5 % ophthalmic ointment Place 1 application into the right eye at bedtime.   No current facility-administered medications for this visit. (Ophthalmic Drugs)   Current Outpatient Medications (Other)  Medication Sig   Acetaminophen 325 MG CAPS Take 325 mg by mouth 3 (three) times daily as needed.   aspirin 81 MG  chewable tablet Chew 81 mg by mouth daily.   atorvastatin (LIPITOR) 80 MG tablet Take 1 tablet (80 mg total) by mouth daily at 6 PM.   Calcium Carbonate-Vitamin D (CALTRATE 600+D PO) Take 1 tablet by mouth 2 (two) times daily.   cephALEXin (KEFLEX) 250 MG capsule Take 250 mg by mouth at bedtime.   chlorpheniramine (CHLOR-TRIMETON) 4 MG tablet Take 4 mg by mouth every 4 (four) hours as needed for allergies.   denosumab (PROLIA) 60 MG/ML SOSY injection See admin instructions.   diclofenac sodium (VOLTAREN) 1 % GEL diclofenac 1 % topical gel  AS DIRECTED TWICE A DAY AS NEEDED TRANSDERMAL 14 DAYS   famotidine (PEPCID) 20 MG tablet Take 20 mg by mouth daily.   FLUZONE HIGH-DOSE QUADRIVALENT 0.7 ML SUSY    furosemide (LASIX) 20 MG tablet Take 20 mg by mouth as needed. Every other day as of 3/17 due to high K   metoprolol (LOPRESSOR) 50 MG tablet Take 25 mg by mouth 2 (two) times daily.   Multiple Vitamins-Minerals (MULTIPLE VITAMINS/WOMENS PO) Take 1 tablet by mouth daily. W/vitamin D   No current facility-administered medications for this visit. (Other)   REVIEW OF SYSTEMS: ROS   Positive for: Cardiovascular, Eyes, Respiratory Negative for: Constitutional, Gastrointestinal, Neurological, Skin, Genitourinary, Musculoskeletal, HENT, Endocrine, Psychiatric, Allergic/Imm, Heme/Lymph Last edited by Corrinne Eagle on 07/02/2021  8:37 AM.     ALLERGIES Allergies  Allergen  Reactions   Meloxicam Hives   Other Other (See Comments)   Guaifenesin Er Rash    Other reaction(s): rash   Nitrofuran Derivatives Rash    PAST MEDICAL HISTORY Past Medical History:  Diagnosis Date   Anemia    Arthritis    Glaucoma    OU   Gout, joint    Hypertension    Hypertensive retinopathy    OU   Macular degeneration    OU   NSTEMI (non-ST elevated myocardial infarction) (HCC) 08/2015   Stroke [facility])    Past Surgical History:  Procedure Laterality Date   ABDOMINAL HYSTERECTOMY     BREAST SURGERY      CYST   CARDIAC CATHETERIZATION N/A 08/18/2015   Procedure: Left Heart Cath and Coronary Angiography;  Surgeon: Lennette Bihari, MD;  Location: [facility] INVASIVE CV LAB;  Service: Cardiovascular;  Laterality: N/A;   CARDIAC CATHETERIZATION  08/18/2015   Procedure: Coronary Stent Intervention;  Surgeon: Lennette Bihari, MD;  Location: [facility] INVASIVE CV LAB;  Service: Cardiovascular;;   CATARACT EXTRACTION Bilateral    DILATION AND CURETTAGE OF UTERUS     EYE SURGERY Bilateral    HERNIA REPAIR     IRIDOTOMY / IRIDECTOMY Right    JOINT REPLACEMENT  left knee   REFRACTIVE SURGERY Right 2017    FAMILY HISTORY Family History  Problem Relation Age of Onset   Heart disease Mother    Stroke Mother     SOCIAL HISTORY Social History   Tobacco Use   Smoking status: Never   Smokeless tobacco: Never  Substance Use Topics   Alcohol use: No    Alcohol/week: 0.0 standard drinks   Drug use: No       OPHTHALMIC EXAM:  Base Eye Exam     Visual Acuity (Snellen - Linear)       Right Left   Dist [place] CF @ face 20/40 -2   Dist ph [place] NI 20/25 -2         Tonometry (Tonopen, 8:46 AM)       Right Left   Pressure ur 11         Tonometry #2 (Applanation, 8:47 AM)       Right Left   Pressure 03          Pupils       Dark Light Shape React APD   Right 3 2 Round Minimal 0   Left 3 2 Round Minimal 0         Visual Fields       Left Right   Restrictions  Total superior temporal, inferior temporal, inferior nasal deficiencies; Partial inner superior nasal deficiency         Extraocular Movement       Right Left    Full Full         Neuro/Psych     Oriented x3: Yes   Mood/Affect: Normal         Dilation     Both eyes: 1.0% Mydriacyl, 2.5% Phenylephrine @ 8:54 AM           Slit Lamp and Fundus Exam     Slit Lamp Exam       Right Left   Lids/Lashes Dermatochalasis - upper lid, mild Telangiectasia Dermatochalasis - upper lid, mild Meibomian gland dysfunction    Conjunctiva/Sclera White and quiet Trace Injection   Cornea Arcus, 3+ edema w/ Descemet's folds, well healed temporal cataract wounds, mild endo pigment, 3+ Punctate epithelial  erosions Arcus, 1-2+Punctate epithelial erosions, Well healed cataract wounds   Anterior Chamber Deep, 0.5+pigment Deep and quiet   Iris Round and poorly dilated  Round and poorly dilated to 4.49mm   Lens PC IOL in good position with open PC PC IOL in good position with open PC   Vitreous Vitreous syneresis, +RBC, mild diffuse VH, vitreous condensations, +pigment Vitreous syneresis, Posterior vitreous detachment         Fundus Exam       Right Left   Disc Hazy view, 3+pallor, +cupping, thin inf rim and temp PPA, Sharp rim 1+ Pallor, Sharp rim, +cupping, temporal Peripapillary atrophy   C/D Ratio 0.6 0.6   Macula Hazy view from vitreous opacities and corneal edema -- grossly flat, central pigment clump, mild fibrosis Flat, good foveal reflex, RPE mottling and clumping, drusen, No heme or edema   Vessels attenuated, Tortuous Vascular attenuation, mild Tortuousity   Periphery Hazy view, grossly Attached, obscured by old VH and vitreous condensations Attached, No heme             IMAGING AND PROCEDURES  Imaging and Procedures for @TODAY @  OCT, Retina - OU - Both Eyes       Right Eye Quality was poor. Central Foveal Thickness: 318. Progression has improved. Findings include abnormal foveal contour, no IRF, no SRF, inner retinal atrophy, outer retinal atrophy, subretinal hyper-reflective material (Improvement in image quality, still relatively poor, retinal attached with diffuse atrophy, no CME/IRF/SRF).   Left Eye Quality was borderline. Central Foveal Thickness: 250. Progression has been stable. Findings include normal foveal contour, no SRF, retinal drusen , outer retinal atrophy, no IRF (stable improvement of focal IRF SN fovea, patchy ORA).   Notes *Images captured and stored on drive  Diagnosis /  Impression:  OD: Improvement in image quality, still relatively poor, retina attached with diffuse atrophy, no CME/IRF/SRF OS: NFP, no SRF, +drusen -- stable improvement of focal IRF SN fovea, patchy ORA  Clinical management:  See below  Abbreviations: NFP - Normal foveal profile. CME - cystoid macular edema. PED - pigment epithelial detachment. IRF - intraretinal fluid. SRF - subretinal fluid. EZ - ellipsoid zone. ERM - epiretinal membrane. ORA - outer retinal atrophy. ORT - outer retinal tubulation. SRHM - subretinal hyper-reflective material            ASSESSMENT/PLAN:    ICD-10-CM   1. Branch retinal vein occlusion of right eye with macular edema  H34.8310     2. Retinal edema  H35.81 OCT, Retina - OU - Both Eyes    3. Vitreous hemorrhage of right eye (HCC)  H43.11     4. Exudative age-related macular degeneration of right eye with active choroidal neovascularization (HCC)  H35.3211     5. 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 Essential hypertension  I10     7. Hypertensive retinopathy of both eyes  H35.033     8. Primary open angle glaucoma (POAG) of both eyes, severe stage  H40.1133     9. Pseudophakia of both eyes  Z96.1     10. Dry eyes, bilateral  H04.123     11. Corneal edema  H18.20        1-3. Inferior HRVO w/ CME and VH, OD  - pt lost to f/u from 10/16/2019 to 07/14/2020 (9 mos)  - presenting BCVA CF 2' (09.14.20)  - pt subjectively reports decline in vision OD since Feb 2020  -  presenting OCT shows severe CME/IRF temporal macula, +SRF overlying low PED (?exudative ARMD component)  - S/p IVA OD #1 09.14.20, #2 (10.20.20), #3 (12.02.20), #4 (01.06.21), #5 (10.05.21), #6 (11.03.21), #7 (12.01.21), #8 (12.29.22), #9 (04.13.22), #10 (05.11.22), #11 (06.16.22), #12 (07.21.22)  - OCT today was Improvement in image quality, still relatively poor, retina attached with diffuse atrophy, no CME/IRF/SRF  -  discussed findings, poor prognosis and treatment options   - recommend holding IVA OD today -- no macular edema  - f/u 3 weeks, DFE, OCT, possible injection  4. Exudative age related macular degeneration, OD    - initial OCT showed +IRF/SRF -- SRF overlying low lying PED  - original exam shows +CNVM with surrounding heme --stably improved today  - suspect exudative ARMD component complicating HRVO w/ CME  - S/p IVA OD #1 (09.14.20), #2 (10.20.20), #3 (12.02.20), #4 (01.06.21), #5 (10.05.21), #6 (11.03.21), #7 (12.01.21), #8 (12.29.22), #9 (04.13.22), #10 (05.11.22), #11 (06.16.22), #12 (07.12.22)  - recommend  holding IVA OD today as above  - f/u in 3 weeks -- DFE/OCT, possible injection  5. Age related macular degeneration, non-exudative, OS  - OCT shows stable improvement of focal cystic changes  - no heme or edema visible on exam  - recommend close observation   - cont Amsler grid monitoring   - f/u 3 weeks  6,7. Hypertensive retinopathy OU  - discussed importance of tight BP control  - monitor   8. POAG OU -- severe stage  - under the expert management of Dr. Marchelle Gearing  - s/p SLT OD 3.23.2017  - s/p TDC OD w/ Dr. Loraine Grip, 3.30.21  - IOP OD low today at 03, OS good at 11  - patient on cosopt bid OU, brimonidine tid OU -- will stop cosopt  - f/u in 3 wks for IOP check  9. Pseudophakia OU  - s/p CE/IOL OU  - beautiful surgeries, doing well  - monitor   10. Dry eyes OU (OD > OS)  - recommend artificial tears and lubricating ointment as needed  11. Corneal edema OD  - 3+ Descemet folds and mild haze -- slight improvement from prior  - recommend treatment to improve monitoring of retina and posterior pole   - cont Lotemax SM OD  6x/day  - cont Muro 128 gtts QID OD  - cont Muro 128 ung qhs OD  - f/u 3 wks   Ophthalmic Meds Ordered this visit:  No orders of the defined types were placed in this encounter.     Return in about 3 weeks (around 07/23/2021) for f/u IOP  check.  There are no Patient Instructions on file for this visit.   Explained the diagnoses, plan, and follow up with the patient and they expressed understanding.  Patient expressed understanding of the importance of proper follow up care.   This document serves as a record of services personally performed by Karie Chimera, MD, PhD. It was created on their behalf by Joni Reining, an ophthalmic technician. The creation of this record is the provider's dictation and/or activities during the visit.    Electronically signed by: Joni Reining COA, 07/05/21  8:51 PM  This document serves as a record of services personally performed by Karie Chimera, MD, PhD. It was created on their behalf by Glee Arvin. Manson Passey, OA an ophthalmic technician. The creation of this record is the provider's dictation and/or activities during the visit.    Electronically signed by: Glee Arvin. Manson Passey, [place] 09.23.2022 8:51  PM  Karie Chimera, M.D., Ph.D. Diseases & Surgery of the Retina and Vitreous [facility] 07/02/2021  I have reviewed the above documentation for accuracy and completeness, and I agree with the above. Karie Chimera, M.D., Ph.D. 07/05/21 8:51 PM   Abbreviations: M myopia (nearsighted); A astigmatism; H hyperopia (farsighted); P presbyopia; Mrx spectacle prescription;  CTL contact lenses; OD right eye; OS left eye; OU both eyes  XT exotropia; ET esotropia; PEK punctate epithelial keratitis; PEE punctate epithelial erosions; DES dry eye syndrome; MGD meibomian gland dysfunction; ATs artificial tears; PFAT's preservative free artificial tears; NSC nuclear sclerotic cataract; PSC posterior subcapsular cataract; ERM epi-retinal membrane; PVD posterior vitreous detachment; RD retinal detachment; DM diabetes mellitus; DR diabetic retinopathy; NPDR non-proliferative diabetic retinopathy; PDR proliferative diabetic retinopathy; CSME clinically significant macular edema; DME diabetic macular  edema; dbh dot blot hemorrhages; CWS cotton wool spot; POAG primary open angle glaucoma; C/D cup-to-disc ratio; HVF humphrey visual field; GVF goldmann visual field; OCT optical coherence tomography; IOP intraocular pressure; BRVO Branch retinal vein occlusion; CRVO central retinal vein occlusion; CRAO central retinal artery occlusion; BRAO branch retinal artery occlusion; RT retinal tear; SB scleral buckle; PPV pars plana vitrectomy; VH Vitreous hemorrhage; PRP panretinal laser photocoagulation; IVK intravitreal kenalog; VMT vitreomacular traction; MH Macular hole;  NVD neovascularization of the disc; NVE neovascularization elsewhere; AREDS age related eye disease study; ARMD age related macular degeneration; POAG primary open angle glaucoma; EBMD epithelial/anterior basement membrane dystrophy; ACIOL anterior chamber intraocular lens; IOL intraocular lens; PCIOL posterior chamber intraocular lens; Phaco/IOL phacoemulsification with intraocular lens placement; PRK photorefractive keratectomy; LASIK laser assisted in situ keratomileusis; HTN hypertension; DM diabetes mellitus; COPD chronic obstructive pulmonary disease

## 2021-07-02 ENCOUNTER — Ambulatory Visit (INDEPENDENT_AMBULATORY_CARE_PROVIDER_SITE_OTHER): Payer: Medicare Other | Admitting: Ophthalmology

## 2021-07-02 ENCOUNTER — Other Ambulatory Visit: Payer: Self-pay

## 2021-07-02 DIAGNOSIS — H04123 Dry eye syndrome of bilateral lacrimal glands: Secondary | ICD-10-CM

## 2021-07-02 DIAGNOSIS — H4311 Vitreous hemorrhage, right eye: Secondary | ICD-10-CM | POA: Diagnosis not present

## 2021-07-02 DIAGNOSIS — H35033 Hypertensive retinopathy, bilateral: Secondary | ICD-10-CM | POA: Diagnosis not present

## 2021-07-02 DIAGNOSIS — H3581 Retinal edema: Secondary | ICD-10-CM

## 2021-07-02 DIAGNOSIS — H1820 Unspecified corneal edema: Secondary | ICD-10-CM | POA: Diagnosis not present

## 2021-07-02 DIAGNOSIS — H353211 Exudative age-related macular degeneration, right eye, with active choroidal neovascularization: Secondary | ICD-10-CM | POA: Diagnosis not present

## 2021-07-02 DIAGNOSIS — H353122 Nonexudative age-related macular degeneration, left eye, intermediate dry stage: Secondary | ICD-10-CM | POA: Diagnosis not present

## 2021-07-02 DIAGNOSIS — H401133 Primary open-angle glaucoma, bilateral, severe stage: Secondary | ICD-10-CM | POA: Diagnosis not present

## 2021-07-02 DIAGNOSIS — Z961 Presence of intraocular lens: Secondary | ICD-10-CM

## 2021-07-02 DIAGNOSIS — I10 Essential (primary) hypertension: Secondary | ICD-10-CM | POA: Diagnosis not present

## 2021-07-02 DIAGNOSIS — H34831 Tributary (branch) retinal vein occlusion, right eye, with macular edema: Secondary | ICD-10-CM

## 2021-07-05 ENCOUNTER — Encounter (INDEPENDENT_AMBULATORY_CARE_PROVIDER_SITE_OTHER): Payer: Self-pay | Admitting: Ophthalmology

## 2021-07-07 ENCOUNTER — Other Ambulatory Visit (INDEPENDENT_AMBULATORY_CARE_PROVIDER_SITE_OTHER): Payer: Self-pay | Admitting: Ophthalmology

## 2021-07-08 DIAGNOSIS — R350 Frequency of micturition: Secondary | ICD-10-CM | POA: Diagnosis not present

## 2021-07-14 ENCOUNTER — Ambulatory Visit: Payer: Medicare Other | Admitting: Podiatry

## 2021-07-14 ENCOUNTER — Other Ambulatory Visit: Payer: Self-pay

## 2021-07-14 DIAGNOSIS — L97412 Non-pressure chronic ulcer of right heel and midfoot with fat layer exposed: Secondary | ICD-10-CM | POA: Diagnosis not present

## 2021-07-14 DIAGNOSIS — M79674 Pain in right toe(s): Secondary | ICD-10-CM

## 2021-07-14 DIAGNOSIS — B351 Tinea unguium: Secondary | ICD-10-CM

## 2021-07-14 DIAGNOSIS — M79675 Pain in left toe(s): Secondary | ICD-10-CM

## 2021-07-14 DIAGNOSIS — E08621 Diabetes mellitus due to underlying condition with foot ulcer: Secondary | ICD-10-CM | POA: Diagnosis not present

## 2021-07-14 NOTE — Progress Notes (Signed)
   Subjective:  87 y.o. female with PMHx of diabetes mellitus presenting today with her daughter for follow-up evaluation of a sore/callus that developed to the right plantar forefoot.  Patient has a history of calluses and lesions that developed recurrently to the right plantar forefoot.  Patient states that there is significant improvement in reduction in pain.  They have been applying the Silvadene cream as instructed.    Patient today is also complaining of symptomatic sore toenails to the bilateral feet.  She is requesting a nail trim today.  She is unable to trim her own nails.  They are painful in close toed shoes   Past Medical History:  Diagnosis Date   Anemia    Arthritis    Glaucoma    OU   Gout, joint    Hypertension    Hypertensive retinopathy    OU   Macular degeneration    OU   NSTEMI (non-ST elevated myocardial infarction) (HCC) 08/2015   Stroke [facility])       Objective/Physical Exam General: The patient is alert and oriented x3 in no acute distress.  Dermatology:  Wound #1 noted to the right plantar forefoot measuring approximately 0.5x0.6 x 0.1 cm (LxWxD).   To the noted ulceration(s), there is no eschar. There is a moderate amount of slough, fibrin, and necrotic tissue noted. Granulation tissue and wound base is red. There is a minimal amount of serosanguineous drainage noted. There is no exposed bone muscle-tendon ligament or joint. There is no malodor. Periwound integrity is intact.  Hyperkeratotic dystrophic thickened elongated nails noted 1-5 bilateral.  Skin is warm, dry and supple bilateral lower extremities.  Vascular: Palpable pedal pulses bilaterally. No edema or erythema noted. Capillary refill within normal limits.  Neurological: Epicritic and protective threshold diminished bilaterally.   Musculoskeletal Exam: Fat pad atrophy with tenderness to palpation right forefoot.  Palpation of the metatarsal heads noted specifically to the subsecond and third  MTPJ  Assessment: 1.  Ulcer right plantar forefoot secondary to diabetes mellitus 2. diabetes mellitus w/ peripheral neuropathy 3.  Pain due to onychomycosis of toenails 1-5 bilateral   Plan of Care:  1. Patient was evaluated.  Overall there continues to be significant improvement to the wound.  We will continue the same regimen 2. medically necessary excisional debridement including subcutaneous tissue was performed using a tissue nipper and a chisel blade. Excisional debridement of all the necrotic nonviable tissue down to healthy bleeding viable tissue was performed with post-debridement measurements same as pre-. 3. the wound was cleansed and dry sterile dressing applied. 4.  Continue offloading felt pad to the insoles of the diabetic shoe right foot.  An additional offloading felt pad was applied to the insole today 5.  Continue Silvadene cream daily with a light dressing 6.  Mechanical debridement of nails 1-5 bilateral was performed using a nail nipper without incident or bleeding  7.  Patient is to return to clinic in 3 weeks   Felecia Shelling, DPM [facility]  Dr. Felecia Shelling, DPM    [address] [place], [place] [address]                Office [phone]  Fax [phone]

## 2021-07-20 NOTE — Progress Notes (Signed)
[facility] Note  07/23/2021     CHIEF COMPLAINT Patient presents for Retina Follow Up   HISTORY OF PRESENT ILLNESS: [name] is a 87 y.o. female who presents to the clinic today for:  HPI     Retina Follow Up   Patient presents with  CRVO/BRVO.  In right eye.  Severity is mild.  Duration of 3 weeks.  Since onset it is stable.  I, the attending physician,  performed the HPI with the patient and updated documentation appropriately.        Comments   PT here for 3 wk ret f/u BRVO OD. Pt states she is having to wear her reading glasses more often, otherwise vision is about the same. Some irritation, itching of eyelids OU.       Last edited by Bernarda Caffey, MD on 07/25/2021  1:55 AM.       Referring physician: [name], DO [address] [place],  [place] [address]  HISTORICAL INFORMATION:   Selected notes from the MEDICAL RECORD NUMBER Referred by Dr. Midge Aver for concern of RVO OS   CURRENT MEDICATIONS: Current Outpatient Medications (Ophthalmic Drugs)  Medication Sig   brimonidine (ALPHAGAN) 0.2 % ophthalmic solution brimonidine 0.2 % eye drops  INSTILL 1 DROP INTO BOTH EYES TWICE A DAY   dorzolamide-timolol (COSOPT) 22.3-6.8 MG/ML ophthalmic solution Place 1 drop into both eyes 2 (two) times daily.    latanoprost (XALATAN) 0.005 % ophthalmic solution latanoprost 0.005 % eye drops  INSTILL 1 DROP INTO RIGHT EYE AT BEDTIME   LOTEMAX SM 0.38 % GEL PLACE 1 DROP INTO THE RIGHT EYE 3 TIMES DAILY.   Polyethyl Glycol-Propyl Glycol (SYSTANE OP) Apply 1 drop to eye 2 (two) times daily.   sodium chloride (MURO 128) 5 % ophthalmic ointment Place 1 application into the right eye at bedtime.   sodium chloride (MURO 128) 5 % ophthalmic solution INSTILL 1 DROP INTO RIGHT EYE 4 TIMES A DAY   No current facility-administered medications for this visit. (Ophthalmic Drugs)   Current Outpatient Medications (Other)  Medication Sig    Acetaminophen 325 MG CAPS Take 325 mg by mouth 3 (three) times daily as needed.   aspirin 81 MG chewable tablet Chew 81 mg by mouth daily.   atorvastatin (LIPITOR) 80 MG tablet Take 1 tablet (80 mg total) by mouth daily at 6 PM.   Calcium Carbonate-Vitamin D (CALTRATE 600+D PO) Take 1 tablet by mouth 2 (two) times daily.   cephALEXin (KEFLEX) 250 MG capsule Take 250 mg by mouth at bedtime.   chlorpheniramine (CHLOR-TRIMETON) 4 MG tablet Take 4 mg by mouth every 4 (four) hours as needed for allergies.   denosumab (PROLIA) 60 MG/ML SOSY injection See admin instructions.   diclofenac sodium (VOLTAREN) 1 % GEL diclofenac 1 % topical gel  AS DIRECTED TWICE A DAY AS NEEDED TRANSDERMAL 14 DAYS   famotidine (PEPCID) 20 MG tablet Take 20 mg by mouth daily.   FLUZONE HIGH-DOSE QUADRIVALENT 0.7 ML SUSY    furosemide (LASIX) 20 MG tablet Take 20 mg by mouth as needed. Every other day as of 3/17 due to high K   metoprolol (LOPRESSOR) 50 MG tablet Take 25 mg by mouth 2 (two) times daily.   Multiple Vitamins-Minerals (MULTIPLE VITAMINS/WOMENS PO) Take 1 tablet by mouth daily. W/vitamin D   No current facility-administered medications for this visit. (Other)   REVIEW OF SYSTEMS: ROS   Positive for: Cardiovascular, Eyes,  Respiratory Negative for: Constitutional, Gastrointestinal, Neurological, Skin, Genitourinary, Musculoskeletal, HENT, Endocrine, Psychiatric, Allergic/Imm, Heme/Lymph Last edited by Kingsley Spittle, COT on 07/23/2021  9:08 AM.      ALLERGIES Allergies  Allergen Reactions   Meloxicam Hives   Other Other (See Comments)   Guaifenesin Er Rash    Other reaction(s): rash   Nitrofuran Derivatives Rash    PAST MEDICAL HISTORY Past Medical History:  Diagnosis Date   Anemia    Arthritis    Glaucoma    OU   Gout, joint    Hypertension    Hypertensive retinopathy    OU   Macular degeneration    OU   NSTEMI (non-ST elevated myocardial infarction) ([place]) 08/2015   Stroke  [facility])    Past Surgical History:  Procedure Laterality Date   ABDOMINAL HYSTERECTOMY     BREAST SURGERY     CYST   CARDIAC CATHETERIZATION N/A 08/18/2015   Procedure: Left Heart Cath and Coronary Angiography;  Surgeon: Troy Sine, MD;  Location: [place] [facility];  Service: Cardiovascular;  Laterality: N/A;   CARDIAC CATHETERIZATION  08/18/2015   Procedure: Coronary Stent Intervention;  Surgeon: Troy Sine, MD;  Location: [place] [facility];  Service: Cardiovascular;;   CATARACT EXTRACTION Bilateral    DILATION AND CURETTAGE OF UTERUS     EYE SURGERY Bilateral    HERNIA REPAIR     IRIDOTOMY / IRIDECTOMY Right    JOINT REPLACEMENT  left knee   REFRACTIVE SURGERY Right 2017    FAMILY HISTORY Family History  Problem Relation Age of Onset   Heart disease Mother    Stroke Mother     SOCIAL HISTORY Social History   Tobacco Use   Smoking status: Never   Smokeless tobacco: Never  Substance Use Topics   Alcohol use: No    Alcohol/week: 0.0 standard drinks   Drug use: No       OPHTHALMIC EXAM:  Base Eye Exam     Visual Acuity (Snellen - Linear)       Right Left   Dist cc CF at face 20/40 -1   Dist ph cc NI 20/25 -2    Correction: Glasses         Tonometry (Tonopen, 9:19 AM)       Right Left   Pressure 9 14         Pupils       Dark Light Shape React APD   Right 3 2 Round Minimal None   Left 3 2 Round Minimal None         Visual Fields       Left Right    Full    Restrictions  Total superior temporal, inferior temporal, inferior nasal deficiencies; Partial inner superior nasal deficiency         Neuro/Psych     Oriented x3: Yes   Mood/Affect: Normal         Dilation     Both eyes: 1.0% Mydriacyl, 2.5% Phenylephrine @ 9:19 AM           Slit Lamp and Fundus Exam     Slit Lamp Exam       Right Left   Lids/Lashes Dermatochalasis - upper lid, mild Telangiectasia Dermatochalasis - upper lid, mild Meibomian gland dysfunction    Conjunctiva/Sclera White and quiet Trace Injection   Cornea Arcus, 3+ edema w/ Descemet's folds, well healed temporal cataract wounds, 2+ fine endo pigment, 3+ Punctate epithelial erosions Arcus, 1-2+Punctate  epithelial erosions, Well healed cataract wounds   Anterior Chamber Deep and clear, No cells or pigment Deep and quiet   Iris Round and poorly dilated  Round and poorly dilated to 4.3m   Lens PC IOL in good position with open PC PC IOL in good position with open PC   Vitreous Vitreous syneresis, +RBC, mild diffuse VH, vitreous condensations, +pigment Vitreous syneresis, Posterior vitreous detachment         Fundus Exam       Right Left   Disc Hazy view, 3+pallor, +cupping, thin inf rim and temp PPA, Sharp rim 1+ Pallor, Sharp rim, +cupping, temporal Peripapillary atrophy   C/D Ratio 0.8 0.6   Macula Hazy view improved -- grossly flat, central pigment clump, mild fibrosis Flat, good foveal reflex, RPE mottling and clumping, drusen, No heme or edema   Vessels attenuated, Tortuous Vascular attenuation, mild Tortuousity   Periphery Hazy view, grossly Attached, obscured by old VH and vitreous condensations Attached, No heme            Refraction     Wearing Rx       Sphere Cylinder Axis   Right -1.50 +2.25 173   Left -0.75 +2.25 166            IMAGING AND PROCEDURES  Imaging and Procedures for _0 @  OCT, Retina - OU - Both Eyes       Right Eye Quality was poor. Progression has been stable. Findings include abnormal foveal contour, no IRF, no SRF, inner retinal atrophy, outer retinal atrophy, subretinal hyper-reflective material (Poor image quality, retinal attached with diffuse atrophy, no CME/IRF/SRF).   Left Eye Quality was good. Central Foveal Thickness: 249. Progression has been stable. Findings include normal foveal contour, no SRF, retinal drusen , outer retinal atrophy, no IRF (stable improvement of focal IRF SN fovea, patchy ORA).   Notes *Images  captured and stored on drive  Diagnosis / Impression:  OD: Poor image quality, retina attached with diffuse atrophy, no CME/IRF/SRF OS: NFP, no SRF, +drusen -- stable improvement of focal IRF SN fovea, patchy ORA  Clinical management:  See below  Abbreviations: NFP - Normal foveal profile. CME - cystoid macular edema. PED - pigment epithelial detachment. IRF - intraretinal fluid. SRF - subretinal fluid. EZ - ellipsoid zone. ERM - epiretinal membrane. ORA - outer retinal atrophy. ORT - outer retinal tubulation. SRHM - subretinal hyper-reflective material             ASSESSMENT/PLAN:    ICD-10-CM   1. Branch retinal vein occlusion of right eye with macular edema  H34.8310     2. Retinal edema  H35.81 OCT, Retina - OU - Both Eyes    3. Vitreous hemorrhage of right eye ([place]  H43.11     4. Exudative age-related macular degeneration of right eye with active choroidal neovascularization ([place]  H35.3211     5. 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 Essential hypertension  I10     7. Hypertensive retinopathy of both eyes  H35.033     8. Primary open angle glaucoma (POAG) of both eyes, severe stage  H40.1133     9. Pseudophakia of both eyes  Z96.1     10. Dry eyes, bilateral  H04.123     11. Corneal edema  H18.20     1-3. Inferior HRVO w/ CME and VH, OD  - pt lost to f/u from 10/16/2019 to 07/14/2020 (9  mos)  - presenting BCVA CF 2' (09.14.20)  - pt subjectively reports decline in vision OD since Feb 2020  - presenting OCT shows severe CME/IRF temporal macula, +SRF overlying low PED (?exudative ARMD component)  - S/p IVA OD #1 09.14.20, #2 (10.20.20), #3 (12.02.20), #4 (01.06.21), #5 (10.05.21), #6 (11.03.21), #7 (12.01.21), #8 (12.29.22), #9 (04.13.22), #10 (05.11.22), #11 (06.16.22), #12 (07.21.22)  - OCT today shows poor image quality, retina attached with diffuse atrophy, no CME/IRF/SRF  - discussed findings, poor  prognosis and treatment options   - recommend holding IVA OD today -- no macular edema   - f/u 3 weeks, DFE, OCT, possible injection  4. Exudative age related macular degeneration, OD    - initial OCT showed +IRF/SRF -- SRF overlying low lying PED  - original exam shows +CNVM with surrounding heme --stably improved today  - suspect exudative ARMD component complicating HRVO w/ CME  - S/p IVA OD #1 (09.14.20), #2 (10.20.20), #3 (12.02.20), #4 (01.06.21), #5 (10.05.21), #6 (11.03.21), #7 (12.01.21), #8 (12.29.22), #9 (04.13.22), #10 (05.11.22), #11 (06.16.22), #12 (07.12.22)  - recommend  holding IVA OD today as above  - f/u in 3 weeks -- DFE/OCT, possible injection  5. Age related macular degeneration, non-exudative, OS  - OCT shows stable improvement of focal cystic changes  - no heme or edema visible on exam  - recommend close observation   - cont Amsler grid monitoring   - f/u 3 weeks  6,7. Hypertensive retinopathy OU  - discussed importance of tight BP control  - monitor   8. POAG OU -- severe stage  - under the expert management of Dr. Midge Aver  - s/p SLT OD 3.23.2017  - s/p TDC OD w/ [name], 3.30.21  - IOP OD 9, OS good at 14  - patient stopped Cosopt and will decrease Brimonidine BID OU instead of TID.    - f/u in 3-4 wks for IOP check  9. Pseudophakia OU  - s/p CE/IOL OU  - beautiful surgeries, doing well  - monitor    10. Dry eyes OU (OD > OS)  - recommend artificial tears and lubricating ointment as needed  11. Corneal edema OD  - 3+ Descemet folds and mild haze   - recommend treatment to improve monitoring of retina and posterior pole   - cont Lotemax SM OD  6x/day  - cont Muro 128 gtts QID OD  - cont Muro 128 ung qhs OD  - f/u 3 weeks    Ophthalmic Meds Ordered this visit:  No orders of the defined types were placed in this encounter.     Return for 3-4 weeks , DFE, OCT.  There are no Patient Instructions on file for this visit.   Explained  the diagnoses, plan, and follow up with the patient and they expressed understanding.  Patient expressed understanding of the importance of proper follow up care.   This document serves as a record of services personally performed by Gardiner Sleeper, MD, PhD. It was created on their behalf by Leonie Douglas, an ophthalmic technician. The creation of this record is the provider's dictation and/or activities during the visit.    Electronically signed by: Leonie Douglas COA, 07/25/21  1:57 AM   Gardiner Sleeper, M.D., Ph.D. Diseases & Surgery of the Retina and [place] 07/23/2021  I have reviewed the above documentation for accuracy and completeness, and I agree with the above. Gardiner Sleeper, M.D., Ph.D. 07/25/21  1:59 AM   Abbreviations: M myopia (nearsighted); A astigmatism; H hyperopia (farsighted); P presbyopia; Mrx spectacle prescription;  CTL contact lenses; OD right eye; OS left eye; OU both eyes  XT exotropia; ET esotropia; PEK punctate epithelial keratitis; PEE punctate epithelial erosions; DES dry eye syndrome; MGD meibomian gland dysfunction; ATs artificial tears; PFAT's preservative free artificial tears; [place] nuclear sclerotic cataract; PSC posterior subcapsular cataract; ERM epi-retinal membrane; PVD posterior vitreous detachment; RD retinal detachment; DM diabetes mellitus; DR diabetic retinopathy; NPDR non-proliferative diabetic retinopathy; PDR proliferative diabetic retinopathy; CSME clinically significant macular edema; DME diabetic macular edema; dbh dot blot hemorrhages; CWS cotton wool spot; POAG primary open angle glaucoma; C/D cup-to-disc ratio; HVF humphrey visual field; GVF goldmann visual field; OCT optical coherence tomography; IOP intraocular pressure; BRVO Branch retinal vein occlusion; CRVO central retinal vein occlusion; CRAO central retinal artery occlusion; BRAO branch retinal artery occlusion; RT retinal tear; SB scleral buckle; PPV pars  plana vitrectomy; VH Vitreous hemorrhage; PRP panretinal laser photocoagulation; IVK intravitreal kenalog; VMT vitreomacular traction; MH Macular hole;  NVD neovascularization of the disc; NVE neovascularization elsewhere; AREDS age related eye disease study; ARMD age related macular degeneration; POAG primary open angle glaucoma; EBMD epithelial/anterior basement membrane dystrophy; ACIOL anterior chamber intraocular lens; IOL intraocular lens; PCIOL posterior chamber intraocular lens; Phaco/IOL phacoemulsification with intraocular lens placement; [place] photorefractive keratectomy; LASIK laser assisted in situ keratomileusis; HTN hypertension; DM diabetes mellitus; COPD chronic obstructive pulmonary disease

## 2021-07-23 ENCOUNTER — Ambulatory Visit (INDEPENDENT_AMBULATORY_CARE_PROVIDER_SITE_OTHER): Payer: Medicare Other | Admitting: Ophthalmology

## 2021-07-23 ENCOUNTER — Encounter (INDEPENDENT_AMBULATORY_CARE_PROVIDER_SITE_OTHER): Payer: Self-pay | Admitting: Ophthalmology

## 2021-07-23 ENCOUNTER — Other Ambulatory Visit: Payer: Self-pay

## 2021-07-23 DIAGNOSIS — H34831 Tributary (branch) retinal vein occlusion, right eye, with macular edema: Secondary | ICD-10-CM | POA: Diagnosis not present

## 2021-07-23 DIAGNOSIS — H401133 Primary open-angle glaucoma, bilateral, severe stage: Secondary | ICD-10-CM | POA: Diagnosis not present

## 2021-07-23 DIAGNOSIS — H04123 Dry eye syndrome of bilateral lacrimal glands: Secondary | ICD-10-CM | POA: Diagnosis not present

## 2021-07-23 DIAGNOSIS — H4311 Vitreous hemorrhage, right eye: Secondary | ICD-10-CM

## 2021-07-23 DIAGNOSIS — H353122 Nonexudative age-related macular degeneration, left eye, intermediate dry stage: Secondary | ICD-10-CM

## 2021-07-23 DIAGNOSIS — I10 Essential (primary) hypertension: Secondary | ICD-10-CM | POA: Diagnosis not present

## 2021-07-23 DIAGNOSIS — Z961 Presence of intraocular lens: Secondary | ICD-10-CM | POA: Diagnosis not present

## 2021-07-23 DIAGNOSIS — H35033 Hypertensive retinopathy, bilateral: Secondary | ICD-10-CM | POA: Diagnosis not present

## 2021-07-23 DIAGNOSIS — H353211 Exudative age-related macular degeneration, right eye, with active choroidal neovascularization: Secondary | ICD-10-CM

## 2021-07-23 DIAGNOSIS — H1820 Unspecified corneal edema: Secondary | ICD-10-CM | POA: Diagnosis not present

## 2021-07-23 DIAGNOSIS — H3581 Retinal edema: Secondary | ICD-10-CM

## 2021-07-25 ENCOUNTER — Encounter (INDEPENDENT_AMBULATORY_CARE_PROVIDER_SITE_OTHER): Payer: Self-pay | Admitting: Ophthalmology

## 2021-08-05 DIAGNOSIS — R350 Frequency of micturition: Secondary | ICD-10-CM | POA: Diagnosis not present

## 2021-08-13 DIAGNOSIS — E785 Hyperlipidemia, unspecified: Secondary | ICD-10-CM | POA: Diagnosis not present

## 2021-08-13 DIAGNOSIS — E1122 Type 2 diabetes mellitus with diabetic chronic kidney disease: Secondary | ICD-10-CM | POA: Diagnosis not present

## 2021-08-13 DIAGNOSIS — D509 Iron deficiency anemia, unspecified: Secondary | ICD-10-CM | POA: Diagnosis not present

## 2021-08-16 ENCOUNTER — Ambulatory Visit: Payer: Medicare Other | Admitting: Podiatry

## 2021-08-16 ENCOUNTER — Other Ambulatory Visit: Payer: Self-pay

## 2021-08-16 DIAGNOSIS — E08621 Diabetes mellitus due to underlying condition with foot ulcer: Secondary | ICD-10-CM

## 2021-08-16 DIAGNOSIS — L97412 Non-pressure chronic ulcer of right heel and midfoot with fat layer exposed: Secondary | ICD-10-CM | POA: Diagnosis not present

## 2021-08-16 NOTE — Progress Notes (Signed)
Subjective:  87 y.o. female with PMHx of diabetes mellitus presenting today with her daughter for follow-up evaluation of a sore/callus that developed to the right plantar forefoot.  Patient has a history of calluses and lesions that developed recurrently to the right plantar forefoot.  Patient states that there is significant improvement in reduction in pain.  They have been applying the Silvadene cream as instructed.   Patient also states that her right great toenail is very painful and sensitive to light touch.  She would like to have it evaluated   Past Medical History:  Diagnosis Date   Anemia    Arthritis    Glaucoma    OU   Gout, joint    Hypertension    Hypertensive retinopathy    OU   Macular degeneration    OU   NSTEMI (non-ST elevated myocardial infarction) ([facility]) 08/2015   Stroke [facility])       Objective/Physical Exam General: The patient is alert and oriented x3 in no acute distress.  Dermatology:  Wound #1 noted to the right plantar forefoot measuring approximately 0.5x0.6 x 0.1 cm (LxWxD).   To the noted ulceration(s), there is no eschar. There is a moderate amount of slough, fibrin, and necrotic tissue noted. Granulation tissue and wound base is red. There is a minimal amount of serosanguineous drainage noted. There is no exposed bone muscle-tendon ligament or joint. There is no malodor. Periwound integrity is intact.  Hyperkeratotic dystrophic thickened elongated nails noted 1-5 bilateral.  Skin is warm, dry and supple bilateral lower extremities.  Vascular: Palpable pedal pulses bilaterally. No edema or erythema noted. Capillary refill within normal limits.  Neurological: Epicritic and protective threshold diminished bilaterally.   Musculoskeletal Exam: Fat pad atrophy with tenderness to palpation right forefoot.  Palpation of the metatarsal heads noted specifically to the subsecond and third MTPJ  Assessment: 1.  Ulcer right plantar forefoot secondary to  diabetes mellitus 2. diabetes mellitus w/ peripheral neuropathy 3.  Symptomatic toenail right hallux   Plan of Care:  1. Patient was evaluated.  Overall there continues to be significant improvement to the wound.  We will continue the same regimen.  Continue Silvadene cream daily 2. medically necessary excisional debridement including subcutaneous tissue was performed using a tissue nipper and a chisel blade. Excisional debridement of all the necrotic nonviable tissue down to healthy bleeding viable tissue was performed with post-debridement measurements same as pre-. 3. the wound was cleansed and dry sterile dressing applied. 4.  Continue offloading felt pad to the insoles of the diabetic shoe right foot.  An additional offloading felt pad was applied to the insole today 5.  Continue Silvadene cream daily with a light dressing 6.  Mechanical debridement of the right toenail was performed using a nail nipper without incident or bleeding the patient felt significant relief 7.  Patient is to return to clinic in 3 weeks.  Patient has not had right foot x-rays since 06/10/2019.  Follow-up x-rays next visit   [name], DPM [facility]  Dr. Felecia [name], DPM    [address] [place], [place] [address]                Office [phone]  Fax [phone]

## 2021-08-17 ENCOUNTER — Other Ambulatory Visit (INDEPENDENT_AMBULATORY_CARE_PROVIDER_SITE_OTHER): Payer: Self-pay

## 2021-08-17 ENCOUNTER — Telehealth (INDEPENDENT_AMBULATORY_CARE_PROVIDER_SITE_OTHER): Payer: Self-pay

## 2021-08-17 MED ORDER — LOTEMAX SM 0.38 % OP GEL: OPHTHALMIC | 3 refills | Status: DC

## 2021-08-17 NOTE — Telephone Encounter (Signed)
Pts daughter Steward Drone) called on behalf of pt to ask for a new rx of Lotemax. The pharmacy only had the rx for TID and per pts last visit the sig is six times daily. Resent rx [facility] on file and LVM for Brenda/Kriss at [phone]. MS

## 2021-08-20 ENCOUNTER — Encounter (INDEPENDENT_AMBULATORY_CARE_PROVIDER_SITE_OTHER): Payer: Medicare Other | Admitting: Ophthalmology

## 2021-08-24 NOTE — Progress Notes (Addendum)
[facility] Note  08/27/2021     CHIEF COMPLAINT Patient presents for Retina Follow Up   HISTORY OF PRESENT ILLNESS: [name] is a 87 y.o. female who presents to the clinic today for:  HPI     Retina Follow Up   Patient presents with  CRVO/BRVO.  In right eye.  This started 5 weeks ago.  I, the attending physician,  performed the HPI with the patient and updated documentation appropriately.        Comments   Patient here for 5 weeks retina follow up for BRVO OD. Patient states vision doing ok. No eye pain.       Last edited by Bernarda Caffey, MD on 08/28/2021  1:00 AM.        Referring physician: Warden Fillers, MD [address] [place],  [place] [address]  HISTORICAL INFORMATION:   Selected notes from the MEDICAL RECORD NUMBER Referred by Dr. Midge Aver for concern of RVO OS   CURRENT MEDICATIONS: Current Outpatient Medications (Ophthalmic Drugs)  Medication Sig   brimonidine (ALPHAGAN) 0.2 % ophthalmic solution brimonidine 0.2 % eye drops  INSTILL 1 DROP INTO BOTH EYES TWICE A DAY   dorzolamide-timolol (COSOPT) 22.3-6.8 MG/ML ophthalmic solution Place 1 drop into both eyes 2 (two) times daily.    latanoprost (XALATAN) 0.005 % ophthalmic solution latanoprost 0.005 % eye drops  INSTILL 1 DROP INTO RIGHT EYE AT BEDTIME   LOTEMAX SM 0.38 % GEL PLACE 1 DROP INTO THE RIGHT EYE 3 TIMES DAILY.   Loteprednol Etabonate (LOTEMAX SM) 0.38 % GEL Instill 1 drop six times a day in right eye.   Polyethyl Glycol-Propyl Glycol (SYSTANE OP) Apply 1 drop to eye 2 (two) times daily.   sodium chloride (MURO 128) 5 % ophthalmic ointment Place 1 application into the right eye at bedtime.   sodium chloride (MURO 128) 5 % ophthalmic solution INSTILL 1 DROP INTO RIGHT EYE 4 TIMES A DAY   No current facility-administered medications for this visit. (Ophthalmic Drugs)   Current Outpatient Medications (Other)  Medication Sig   Acetaminophen  325 MG CAPS Take 325 mg by mouth 3 (three) times daily as needed.   aspirin 81 MG chewable tablet Chew 81 mg by mouth daily.   atorvastatin (LIPITOR) 80 MG tablet Take 1 tablet (80 mg total) by mouth daily at 6 PM.   Calcium Carbonate-Vitamin D (CALTRATE 600+D PO) Take 1 tablet by mouth 2 (two) times daily.   cephALEXin (KEFLEX) 250 MG capsule Take 250 mg by mouth at bedtime.   chlorpheniramine (CHLOR-TRIMETON) 4 MG tablet Take 4 mg by mouth every 4 (four) hours as needed for allergies.   denosumab (PROLIA) 60 MG/ML SOSY injection See admin instructions.   diclofenac sodium (VOLTAREN) 1 % GEL diclofenac 1 % topical gel  AS DIRECTED TWICE A DAY AS NEEDED TRANSDERMAL 14 DAYS   famotidine (PEPCID) 20 MG tablet Take 20 mg by mouth daily.   FLUZONE HIGH-DOSE QUADRIVALENT 0.7 ML SUSY    furosemide (LASIX) 20 MG tablet Take 20 mg by mouth as needed. Every other day as of 3/17 due to high K   metoprolol (LOPRESSOR) 50 MG tablet Take 25 mg by mouth 2 (two) times daily.   Multiple Vitamins-Minerals (MULTIPLE VITAMINS/WOMENS PO) Take 1 tablet by mouth daily. W/vitamin D   No current facility-administered medications for this visit. (Other)   REVIEW OF SYSTEMS: ROS   Positive for: Cardiovascular, Eyes, Respiratory Negative  for: Constitutional, Gastrointestinal, Neurological, Skin, Genitourinary, Musculoskeletal, HENT, Endocrine, Psychiatric, Allergic/Imm, Heme/Lymph Last edited by Theodore Demark, COA on 08/27/2021  9:43 AM.       ALLERGIES Allergies  Allergen Reactions   Meloxicam Hives   Other Other (See Comments)   Guaifenesin Er Rash    Other reaction(s): rash   Nitrofuran Derivatives Rash    PAST MEDICAL HISTORY Past Medical History:  Diagnosis Date   Anemia    Arthritis    Glaucoma    OU   Gout, joint    Hypertension    Hypertensive retinopathy    OU   Macular degeneration    OU   NSTEMI (non-ST elevated myocardial infarction) ([place]) 08/2015   Stroke [facility])    Past  Surgical History:  Procedure Laterality Date   ABDOMINAL HYSTERECTOMY     BREAST SURGERY     CYST   CARDIAC CATHETERIZATION N/A 08/18/2015   Procedure: Left Heart Cath and Coronary Angiography;  Surgeon: Troy Sine, MD;  Location: [facility];  Service: Cardiovascular;  Laterality: N/A;   CARDIAC CATHETERIZATION  08/18/2015   Procedure: Coronary Stent Intervention;  Surgeon: Troy Sine, MD;  Location: [facility];  Service: Cardiovascular;;   CATARACT EXTRACTION Bilateral    DILATION AND CURETTAGE OF UTERUS     EYE SURGERY Bilateral    HERNIA REPAIR     IRIDOTOMY / IRIDECTOMY Right    JOINT REPLACEMENT  left knee   REFRACTIVE SURGERY Right 2017    FAMILY HISTORY Family History  Problem Relation Age of Onset   Heart disease Mother    Stroke Mother     SOCIAL HISTORY Social History   Tobacco Use   Smoking status: Never   Smokeless tobacco: Never  Vaping Use   Vaping Use: Never used  Substance Use Topics   Alcohol use: No    Alcohol/week: 0.0 standard drinks   Drug use: No       OPHTHALMIC EXAM:  Base Eye Exam     Visual Acuity (Snellen - Linear)       Right Left   Dist cc HM 20/30 -1   Dist ph cc NI 20/25 -2    Correction: Glasses         Tonometry (Tonopen, 9:39 AM)       Right Left   Pressure 11 12         Pupils       Dark Light Shape React APD   Right 3 2 Round Brisk None   Left 3 2 Round Brisk None         Visual Fields       Left Right    Full    Restrictions  Total superior temporal, inferior temporal, inferior nasal deficiencies; Partial inner superior nasal deficiency         Extraocular Movement       Right Left    Full, Ortho Full, Ortho         Neuro/Psych     Oriented x3: Yes   Mood/Affect: Normal         Dilation     Both eyes: 1.0% Mydriacyl, 2.5% Phenylephrine @ 9:39 AM           Slit Lamp and Fundus Exam     Slit Lamp Exam       Right Left   Lids/Lashes Dermatochalasis -  upper lid, mild Telangiectasia Dermatochalasis - upper lid, mild Meibomian gland dysfunction  Conjunctiva/Sclera White and quiet Trace Injection   Cornea Arcus, 3+ edema w/ Descemet's folds, well healed temporal cataract wounds, 2+ fine endo pigment, 3+ Punctate epithelial erosions Arcus, 1-2+Punctate epithelial erosions, Well healed cataract wounds, tear film debris   Anterior Chamber Deep and clear, No cells or pigment Deep and quiet   Iris Round and poorly dilated, No NVI Round and poorly dilated to 4.92mm   Lens PC IOL in good position with open PC PC IOL in good position with open PC   Anterior Vitreous Vitreous syneresis, +RBC, mild diffuse VH, vitreous condensations, +pigment Vitreous syneresis, Posterior vitreous detachment         Fundus Exam       Right Left   Disc Hazy view, 3+pallor, +cupping, thin inf rim and temp PPA, Sharp rim 1+ Pallor, Sharp rim, +cupping, temporal Peripapillary atrophy   C/D Ratio 0.8 0.6   Macula Hazy view -- grossly flat, central pigment clump, mild fibrosis Flat, good foveal reflex, RPE mottling and clumping, drusen, No heme or edema   Vessels attenuated, Tortuous attenuated   Periphery Hazy view, grossly Attached, obscured by old VH and vitreous condensations Attached, No heme            Refraction     Wearing Rx       Sphere Cylinder Axis   Right -1.50 +2.25 173   Left -0.75 +2.25 166            IMAGING AND PROCEDURES  Imaging and Procedures for @TODAY @  OCT, Retina - OU - Both Eyes       Right Eye Quality was poor. Central Foveal Thickness: 316. Progression has been stable. Findings include abnormal foveal contour, no IRF, no SRF, inner retinal atrophy, outer retinal atrophy, subretinal hyper-reflective material (Poor image quality, retinal attached with diffuse atrophy, no CME/IRF/SRF, focal SRHM centrally).   Left Eye Quality was borderline. Central Foveal Thickness: 258. Progression has been stable. Findings include normal  foveal contour, no SRF, retinal drusen , outer retinal atrophy, no IRF (stable improvement of focal IRF SN fovea, patchy ORA).   Notes *Images captured and stored on drive  Diagnosis / Impression:  OD: Poor image quality, retina attached with diffuse atrophy, no CME/IRF/SRF, focal SRHM centrally OS: NFP, no SRF, +drusen -- stable improvement of focal IRF SN fovea, patchy ORA  Clinical management:  See below  Abbreviations: NFP - Normal foveal profile. CME - cystoid macular edema. PED - pigment epithelial detachment. IRF - intraretinal fluid. SRF - subretinal fluid. EZ - ellipsoid zone. ERM - epiretinal membrane. ORA - outer retinal atrophy. ORT - outer retinal tubulation. SRHM - subretinal hyper-reflective material              ASSESSMENT/PLAN:    ICD-10-CM   1. Branch retinal vein occlusion of right eye with macular edema  H34.8310     2. Retinal edema  H35.81 OCT, Retina - OU - Both Eyes    3. Vitreous hemorrhage of right eye (HCC)  H43.11     4. Exudative age-related macular degeneration of right eye with active choroidal neovascularization (HCC)  H35.3211     5. 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 Essential hypertension  I10     7. Hypertensive retinopathy of both eyes  H35.033     8. Primary open angle glaucoma (POAG) of both eyes, severe stage  H40.1133     9. Pseudophakia of both eyes  Z96.1  10. Dry eyes, bilateral  H04.123     11. Corneal edema  H18.20      1-3. Inferior HRVO w/ CME and VH, OD  - pt lost to f/u from 10/16/2019 to 07/14/2020 (9 mos)  - presenting BCVA CF 2' (09.14.20)  - pt subjectively reports decline in vision OD since Feb 2020  - presenting OCT shows severe CME/IRF temporal macula, +SRF overlying low PED (?exudative ARMD component)  - S/p IVA OD #1 09.14.20, #2 (10.20.20), #3 (12.02.20), #4 (01.06.21), #5 (10.05.21), #6 (11.03.21), #7 (12.01.21), #8 (12.29.22), #9 (04.13.22), #10  (05.11.22), #11 (06.16.22), #12 (07.21.22)  - OCT today shows poor image quality, retina attached with diffuse atrophy, no CME/IRF/SRF  - discussed findings, poor prognosis and treatment options   - recommend holding IVA OD today -- no macular edema  - f/u 3-4 mos, DFE, OCT, possible injection  4. Exudative age related macular degeneration, OD    - initial OCT showed +IRF/SRF -- SRF overlying low lying PED  - original exam shows +CNVM with surrounding heme --stably improved today  - suspect exudative ARMD component complicating HRVO w/ CME  - S/p IVA OD #1 (09.14.20), #2 (10.20.20), #3 (12.02.20), #4 (01.06.21), #5 (10.05.21), #6 (11.03.21), #7 (12.01.21), #8 (12.29.22), #9 (04.13.22), #10 (05.11.22), #11 (06.16.22), #12 (07.12.22)  - recommend  holding IVA OD today as above   - f/u 3-4 months -- DFE/OCT, possible injection  5. Age related macular degeneration, non-exudative, OS  - OCT shows stable improvement of focal cystic changes  - no heme or edema visible on exam  - recommend close observation   - cont [place]ler grid monitoring   - f/u 3-4 moss  6,7. Hypertensive retinopathy OU  - discussed importance of tight BP control  - monitor   8. POAG OU -- severe stage  - under the expert management of Dr. Midge Aver  - s/p SLT OD 3.23.2017  - s/p TDC OD w/ [name], 3.30.21  - IOP OD 11, OS good at 12  - Brimonidine BID OU   - monitor  9. Pseudophakia OU  - s/p CE/IOL OU  - beautiful surgeries, doing well  - monitor   10. Dry eyes OU (OD > OS)  - recommend artificial tears and lubricating ointment as needed  11. Corneal edema OD  - 3+ Descemet folds and mild haze   - recommend treatment to improve monitoring of retina and posterior pole   - cont Lotemax SM OD  6x/day  - cont Muro 128 gtts QID OD  - cont Muro 128 ung qhs OD  - monitor   Ophthalmic Meds Ordered this visit:  No orders of the defined types were placed in this encounter.     Return for f/u 3-4 months  HRVO OD, DFE, OCT.  There are no Patient Instructions on file for this visit.   Explained the diagnoses, plan, and follow up with the patient and they expressed understanding.  Patient expressed understanding of the importance of proper follow up care.   This document serves as a record of services personally performed by Gardiner Sleeper, MD, PhD. It was created on their behalf by Leonie Douglas, an ophthalmic technician. The creation of this record is the provider's dictation and/or activities during the visit.    Electronically signed by: Leonie Douglas COA, 08/28/21  1:04 AM  This document serves as a record of services personally performed by Gardiner Sleeper, MD, PhD. It was created on their behalf by Estill Bamberg  Ether Griffins, OA an ophthalmic technician. The creation of this record is the provider's dictation and/or activities during the visit.    Electronically signed by: San Jetty. Owens Shark, [place] 11.18.2022 1:04 AM  Gardiner Sleeper, M.D., Ph.D. Diseases & Surgery of the Retina and [place] 08/27/2021  I have reviewed the above documentation for accuracy and completeness, and I agree with the above. Gardiner Sleeper, M.D., Ph.D. 08/28/21 1:04 AM   Abbreviations: M myopia (nearsighted); A astigmatism; H hyperopia (farsighted); P presbyopia; Mrx spectacle prescription;  CTL contact lenses; OD right eye; OS left eye; OU both eyes  XT exotropia; ET esotropia; PEK punctate epithelial keratitis; PEE punctate epithelial erosions; DES dry eye syndrome; MGD meibomian gland dysfunction; ATs artificial tears; PFAT's preservative free artificial tears; [place] nuclear sclerotic cataract; PSC posterior subcapsular cataract; ERM epi-retinal membrane; PVD posterior vitreous detachment; RD retinal detachment; DM diabetes mellitus; DR diabetic retinopathy; NPDR non-proliferative diabetic retinopathy; PDR proliferative diabetic retinopathy; CSME clinically significant macular edema; DME  diabetic macular edema; dbh dot blot hemorrhages; CWS cotton wool spot; POAG primary open angle glaucoma; C/D cup-to-disc ratio; HVF humphrey visual field; GVF goldmann visual field; OCT optical coherence tomography; IOP intraocular pressure; BRVO Branch retinal vein occlusion; CRVO central retinal vein occlusion; CRAO central retinal artery occlusion; BRAO branch retinal artery occlusion; RT retinal tear; SB scleral buckle; PPV pars plana vitrectomy; VH Vitreous hemorrhage; PRP panretinal laser photocoagulation; IVK intravitreal kenalog; VMT vitreomacular traction; MH Macular hole;  NVD neovascularization of the disc; NVE neovascularization elsewhere; AREDS age related eye disease study; ARMD age related macular degeneration; POAG primary open angle glaucoma; EBMD epithelial/anterior basement membrane dystrophy; ACIOL anterior chamber intraocular lens; IOL intraocular lens; PCIOL posterior chamber intraocular lens; Phaco/IOL phacoemulsification with intraocular lens placement; [place] photorefractive keratectomy; LASIK laser assisted in situ keratomileusis; HTN hypertension; DM diabetes mellitus; COPD chronic obstructive pulmonary disease

## 2021-08-25 DIAGNOSIS — E559 Vitamin D deficiency, unspecified: Secondary | ICD-10-CM | POA: Diagnosis not present

## 2021-08-25 DIAGNOSIS — E538 Deficiency of other specified B group vitamins: Secondary | ICD-10-CM | POA: Diagnosis not present

## 2021-08-25 DIAGNOSIS — I2510 Atherosclerotic heart disease of native coronary artery without angina pectoris: Secondary | ICD-10-CM | POA: Diagnosis not present

## 2021-08-25 DIAGNOSIS — E1122 Type 2 diabetes mellitus with diabetic chronic kidney disease: Secondary | ICD-10-CM | POA: Diagnosis not present

## 2021-08-25 DIAGNOSIS — E785 Hyperlipidemia, unspecified: Secondary | ICD-10-CM | POA: Diagnosis not present

## 2021-08-25 DIAGNOSIS — I129 Hypertensive chronic kidney disease with stage 1 through stage 4 chronic kidney disease, or unspecified chronic kidney disease: Secondary | ICD-10-CM | POA: Diagnosis not present

## 2021-08-25 DIAGNOSIS — N1831 Chronic kidney disease, stage 3a: Secondary | ICD-10-CM | POA: Diagnosis not present

## 2021-08-27 ENCOUNTER — Ambulatory Visit (INDEPENDENT_AMBULATORY_CARE_PROVIDER_SITE_OTHER): Payer: Medicare Other | Admitting: Ophthalmology

## 2021-08-27 ENCOUNTER — Other Ambulatory Visit: Payer: Self-pay

## 2021-08-27 ENCOUNTER — Encounter (INDEPENDENT_AMBULATORY_CARE_PROVIDER_SITE_OTHER): Payer: Self-pay | Admitting: Ophthalmology

## 2021-08-27 DIAGNOSIS — H353122 Nonexudative age-related macular degeneration, left eye, intermediate dry stage: Secondary | ICD-10-CM | POA: Diagnosis not present

## 2021-08-27 DIAGNOSIS — H04123 Dry eye syndrome of bilateral lacrimal glands: Secondary | ICD-10-CM | POA: Diagnosis not present

## 2021-08-27 DIAGNOSIS — H353211 Exudative age-related macular degeneration, right eye, with active choroidal neovascularization: Secondary | ICD-10-CM | POA: Diagnosis not present

## 2021-08-27 DIAGNOSIS — Z961 Presence of intraocular lens: Secondary | ICD-10-CM | POA: Diagnosis not present

## 2021-08-27 DIAGNOSIS — H1820 Unspecified corneal edema: Secondary | ICD-10-CM

## 2021-08-27 DIAGNOSIS — H3581 Retinal edema: Secondary | ICD-10-CM

## 2021-08-27 DIAGNOSIS — H34831 Tributary (branch) retinal vein occlusion, right eye, with macular edema: Secondary | ICD-10-CM

## 2021-08-27 DIAGNOSIS — H401133 Primary open-angle glaucoma, bilateral, severe stage: Secondary | ICD-10-CM

## 2021-08-27 DIAGNOSIS — I10 Essential (primary) hypertension: Secondary | ICD-10-CM | POA: Diagnosis not present

## 2021-08-27 DIAGNOSIS — H4311 Vitreous hemorrhage, right eye: Secondary | ICD-10-CM | POA: Diagnosis not present

## 2021-08-27 DIAGNOSIS — H35033 Hypertensive retinopathy, bilateral: Secondary | ICD-10-CM

## 2021-08-28 ENCOUNTER — Encounter (INDEPENDENT_AMBULATORY_CARE_PROVIDER_SITE_OTHER): Payer: Self-pay | Admitting: Ophthalmology

## 2021-09-06 DIAGNOSIS — R350 Frequency of micturition: Secondary | ICD-10-CM | POA: Diagnosis not present

## 2021-09-08 ENCOUNTER — Ambulatory Visit: Payer: Medicare Other | Admitting: Podiatry

## 2021-09-08 ENCOUNTER — Other Ambulatory Visit: Payer: Self-pay

## 2021-09-08 ENCOUNTER — Ambulatory Visit (INDEPENDENT_AMBULATORY_CARE_PROVIDER_SITE_OTHER): Payer: Medicare Other

## 2021-09-08 DIAGNOSIS — L97412 Non-pressure chronic ulcer of right heel and midfoot with fat layer exposed: Secondary | ICD-10-CM

## 2021-09-08 DIAGNOSIS — E08621 Diabetes mellitus due to underlying condition with foot ulcer: Secondary | ICD-10-CM

## 2021-09-08 NOTE — Progress Notes (Signed)
   Subjective:  87 y.o. female with PMHx of diabetes mellitus presenting today with her daughter for follow-up evaluation of a sore/callus that developed to the right plantar forefoot.  Patient has a history of calluses and lesions that developed recurrently to the right plantar forefoot.  Patient states that there is significant improvement in reduction in pain.  They have been applying the Silvadene cream as instructed.    Past Medical History:  Diagnosis Date   Anemia    Arthritis    Glaucoma    OU   Gout, joint    Hypertension    Hypertensive retinopathy    OU   Macular degeneration    OU   NSTEMI (non-ST elevated myocardial infarction) (HCC) 08/2015   Stroke [facility])          Objective/Physical Exam General: The patient is alert and oriented x3 in no acute distress.  Dermatology:  Wound #1 noted to the right plantar forefoot measuring approximately 0.5x0.6 x 0.1 cm (LxWxD).  The wound continues to be stable  To the noted ulceration(s), there is no eschar. There is a moderate amount of slough, fibrin, and necrotic tissue noted. Granulation tissue and wound base is red. There is a minimal amount of serosanguineous drainage noted. There is no exposed bone muscle-tendon ligament or joint. There is no malodor. Periwound integrity is intact.  Preulcerative callus to the distal tip of the left great toe which is symptomatic to palpation  Vascular: Palpable pedal pulses bilaterally. No edema or erythema noted. Capillary refill within normal limits.  Neurological: Epicritic and protective threshold diminished bilaterally.   Musculoskeletal Exam: Fat pad atrophy with tenderness to palpation right forefoot.  Palpation of the metatarsal heads noted specifically to the subsecond and third MTPJ  Radiographic exam: Diffuse degenerative changes noted throughout the foot.  Dorsal dislocation of the second toe at the MTP joint.  This is likely contributing to increased pressure along the  second and third MTP joints.  No fractures identified.  No evidence of osteomyelitis or osteolytic process  Assessment: 1.  Ulcer right plantar forefoot secondary to diabetes mellitus 2. diabetes mellitus w/ peripheral neuropathy 3.  Symptomatic toenail right hallux   Plan of Care:  1. Patient was evaluated.  X-rays reviewed overall there continues to be significant improvement to the wound.  We will continue the same regimen.  Continue Silvadene cream daily 2. medically necessary excisional debridement including subcutaneous tissue was performed using a tissue nipper and a chisel blade. Excisional debridement of all the necrotic nonviable tissue down to healthy bleeding viable tissue was performed with post-debridement measurements same as pre-. 3. the wound was cleansed and dry sterile dressing applied. 4.  Continue offloading felt pad to the insoles of the diabetic shoe right foot.  An additional offloading felt pad was applied to the insole today 5.  Continue Silvadene cream daily with a light dressing 6.  Return to clinic 3 weeks  Felecia Shelling, DPM [facility]  Dr. Felecia Shelling, DPM    [address] [place], [place] [address]                Office [phone]  Fax [phone]

## 2021-09-09 DIAGNOSIS — R350 Frequency of micturition: Secondary | ICD-10-CM | POA: Diagnosis not present

## 2021-09-29 ENCOUNTER — Ambulatory Visit: Payer: Medicare Other | Admitting: Podiatry

## 2021-10-05 DIAGNOSIS — M7541 Impingement syndrome of right shoulder: Secondary | ICD-10-CM | POA: Diagnosis not present

## 2021-10-06 ENCOUNTER — Other Ambulatory Visit (INDEPENDENT_AMBULATORY_CARE_PROVIDER_SITE_OTHER): Payer: Self-pay | Admitting: Ophthalmology

## 2021-10-07 DIAGNOSIS — M1711 Unilateral primary osteoarthritis, right knee: Secondary | ICD-10-CM | POA: Diagnosis not present

## 2021-10-14 DIAGNOSIS — R350 Frequency of micturition: Secondary | ICD-10-CM | POA: Diagnosis not present

## 2021-10-18 ENCOUNTER — Ambulatory Visit: Payer: Medicare Other | Admitting: Podiatry

## 2021-10-18 ENCOUNTER — Other Ambulatory Visit: Payer: Self-pay

## 2021-10-18 DIAGNOSIS — L97412 Non-pressure chronic ulcer of right heel and midfoot with fat layer exposed: Secondary | ICD-10-CM | POA: Diagnosis not present

## 2021-10-18 DIAGNOSIS — L84 Corns and callosities: Secondary | ICD-10-CM | POA: Diagnosis not present

## 2021-10-18 DIAGNOSIS — E08621 Diabetes mellitus due to underlying condition with foot ulcer: Secondary | ICD-10-CM

## 2021-10-18 NOTE — Progress Notes (Signed)
Subjective:  88 y.o. female with PMHx of diabetes mellitus presenting today with her daughter for follow-up evaluation of a sore/callus that developed to the right plantar forefoot.  Patient believes she is doing well.  They have been applying the Silvadene cream as instructed.  No new complaints at this time  Past Medical History:  Diagnosis Date   Anemia    Arthritis    Glaucoma    OU   Gout, joint    Hypertension    Hypertensive retinopathy    OU   Macular degeneration    OU   NSTEMI (non-ST elevated myocardial infarction) (HCC) 08/2015   Stroke [facility])    Past Surgical History:  Procedure Laterality Date   ABDOMINAL HYSTERECTOMY     BREAST SURGERY     CYST   CARDIAC CATHETERIZATION N/A 08/18/2015   Procedure: Left Heart Cath and Coronary Angiography;  Surgeon: Lennette Bihari, MD;  Location: [facility];  Service: Cardiovascular;  Laterality: N/A;   CARDIAC CATHETERIZATION  08/18/2015   Procedure: Coronary Stent Intervention;  Surgeon: Lennette Bihari, MD;  Location: [facility];  Service: Cardiovascular;;   CATARACT EXTRACTION Bilateral    DILATION AND CURETTAGE OF UTERUS     EYE SURGERY Bilateral    HERNIA REPAIR     IRIDOTOMY / IRIDECTOMY Right    JOINT REPLACEMENT  left knee   REFRACTIVE SURGERY Right 2017   Allergies  Allergen Reactions   Meloxicam Hives   Nitrofurantoin    Other Other (See Comments)   Guaifenesin Er Rash    Other reaction(s): rash   Nitrofuran Derivatives Rash    Objective/Physical Exam General: The patient is alert and oriented x3 in no acute distress.  Dermatology:  Wound #1 noted to the right plantar forefoot has healed.  Complete reepithelialization has occurred.  There is a significant amount of callus around the area.  There is no open wound however.  Preulcerative callus also noted to the distal tip of the left great toe which is symptomatic to palpation  Vascular: Palpable pedal pulses bilaterally. No edema or  erythema noted. Capillary refill within normal limits.  Neurological: Epicritic and protective threshold diminished bilaterally.   Musculoskeletal Exam: Fat pad atrophy with tenderness to palpation right forefoot.  Palpation of the metatarsal heads noted specifically to the subsecond and third MTPJ  Radiographic exam taken 09/08/2021: Diffuse degenerative changes noted throughout the foot.  Dorsal dislocation of the second toe at the MTP joint.  This is likely contributing to increased pressure along the second and third MTP joints.  No fractures identified.  No evidence of osteomyelitis or osteolytic process  Assessment: 1.  Ulcer right plantar forefoot secondary to diabetes mellitus; healed 2. diabetes mellitus w/ peripheral neuropathy 3.  Preulcerative calluses left hallux and right plantar forefoot   Plan of Care:  1. Patient was evaluated.  The wound to the right plantar forefoot has healed completely.  Complete reepithelialization has occurred.  There is a significant amount of periwound callus 2.  Excisional debridement of the hyperkeratotic preulcerative callus tissue was performed to the right plantar forefoot as well as the distal end of the left second toe. 3.  Continue diabetic insoles with offloading felt pads 4.  Recommend daily foot lotion 5.  Return to clinic in 6 weeks.  After the next visit we will place the patient back on the 30-month routine foot care plan  Felecia Shelling, DPM [facility]  Dr.  Felecia Shelling, DPM    [address] [place], [place] [address]                Office [phone]  Fax [phone]

## 2021-10-19 DIAGNOSIS — M810 Age-related osteoporosis without current pathological fracture: Secondary | ICD-10-CM | POA: Diagnosis not present

## 2021-11-01 DIAGNOSIS — H401113 Primary open-angle glaucoma, right eye, severe stage: Secondary | ICD-10-CM | POA: Diagnosis not present

## 2021-11-01 DIAGNOSIS — H34811 Central retinal vein occlusion, right eye, with macular edema: Secondary | ICD-10-CM | POA: Diagnosis not present

## 2021-11-01 DIAGNOSIS — H04123 Dry eye syndrome of bilateral lacrimal glands: Secondary | ICD-10-CM | POA: Diagnosis not present

## 2021-11-01 DIAGNOSIS — H353131 Nonexudative age-related macular degeneration, bilateral, early dry stage: Secondary | ICD-10-CM | POA: Diagnosis not present

## 2021-11-01 DIAGNOSIS — Z961 Presence of intraocular lens: Secondary | ICD-10-CM | POA: Diagnosis not present

## 2021-11-01 DIAGNOSIS — H20011 Primary iridocyclitis, right eye: Secondary | ICD-10-CM | POA: Diagnosis not present

## 2021-11-01 DIAGNOSIS — H40012 Open angle with borderline findings, low risk, left eye: Secondary | ICD-10-CM | POA: Diagnosis not present

## 2021-11-05 ENCOUNTER — Other Ambulatory Visit (INDEPENDENT_AMBULATORY_CARE_PROVIDER_SITE_OTHER): Payer: Self-pay | Admitting: Ophthalmology

## 2021-11-05 NOTE — Progress Notes (Signed)
[facility] Note  11/12/2021     CHIEF COMPLAINT Patient presents for Retina Follow Up    HISTORY OF PRESENT ILLNESS: [name] is a 88 y.o. female who presents to the clinic today for:  HPI     Retina Follow Up   Patient presents with  CRVO/BRVO.  In right eye.  Severity is moderate.  Duration of 11 weeks.  Since onset it is gradually worsening.  I, the attending physician,  performed the HPI with the patient and updated documentation appropriately.        Comments   Pt here for 11 wk ret f/u BRVO OD. Pts daughter feels vision may have decreased in OD. Pt doesn't feel it has gotten worse, just fuzzy. Pts daughter staes it doesn't seem she can see much in front of her but she can on the right side of OD. Pt was placed on Timolol 1 gtt QD OU by Dr. Katy Fitch due to slight increase of IOP OU.       Last edited by Bernarda Caffey, MD on 11/16/2021 12:05 AM.    Pts daughter states she feels like her vision may be down, but states she is still able to read, she saw Dr. Katy Fitch last week and he gave her a new drop for pressure (timolol), she is also using Lotemax 6x/day   Referring physician: [name], DO [address] [place],  [place] [address]  HISTORICAL INFORMATION:   Selected notes from the MEDICAL RECORD NUMBER Referred by Dr. Midge Aver for concern of RVO OS   CURRENT MEDICATIONS: Current Outpatient Medications (Ophthalmic Drugs)  Medication Sig   brimonidine (ALPHAGAN) 0.2 % ophthalmic solution brimonidine 0.2 % eye drops  INSTILL 1 DROP INTO BOTH EYES TWICE A DAY   brimonidine (ALPHAGAN) 0.2 % ophthalmic solution 1 drop into affected eye   latanoprost (XALATAN) 0.005 % ophthalmic solution latanoprost 0.005 % eye drops  INSTILL 1 DROP INTO RIGHT EYE AT BEDTIME   LOTEMAX SM 0.38 % GEL PLACE 1 DROP INTO THE RIGHT EYE 3 TIMES DAILY.   LOTEMAX SM 0.38 % GEL INSTILL 1 DROP SIX TIMES A DAY IN RIGHT EYE.   Polyethyl Glycol-Propyl  Glycol (SYSTANE OP) Apply 1 drop to eye 2 (two) times daily.   sodium chloride (MURO 128) 5 % ophthalmic ointment Place 1 application into the right eye at bedtime.   sodium chloride (MURO 128) 5 % ophthalmic solution INSTILL 1 DROP INTO RIGHT EYE 4 TIMES A DAY   timolol (BETIMOL) 0.25 % ophthalmic solution Place 1 drop into both eyes daily.   dorzolamide-timolol (COSOPT) 22.3-6.8 MG/ML ophthalmic solution Place 1 drop into both eyes 2 (two) times daily.  (Patient not taking: Reported on 11/12/2021)   No current facility-administered medications for this visit. (Ophthalmic Drugs)   Current Outpatient Medications (Other)  Medication Sig   Acetaminophen 325 MG CAPS Take 325 mg by mouth 3 (three) times daily as needed.   amoxicillin (AMOXIL) 500 MG capsule SMARTSIG:4 Capsule(s) By Mouth   aspirin 81 MG chewable tablet Chew 81 mg by mouth daily.   atorvastatin (LIPITOR) 80 MG tablet Take 1 tablet (80 mg total) by mouth daily at 6 PM.   atorvastatin (LIPITOR) 80 MG tablet Take 1 tablet by mouth daily.   Calcium Carbonate-Vitamin D (CALTRATE 600+D PO) Take 1 tablet by mouth 2 (two) times daily.   cephALEXin (KEFLEX) 250 MG capsule Take 250 mg by mouth at bedtime.  chlorpheniramine (CHLOR-TRIMETON) 4 MG tablet Take 4 mg by mouth every 4 (four) hours as needed for allergies.   denosumab (PROLIA) 60 MG/ML SOSY injection See admin instructions.   diclofenac sodium (VOLTAREN) 1 % GEL diclofenac 1 % topical gel  AS DIRECTED TWICE A DAY AS NEEDED TRANSDERMAL 14 DAYS   famotidine (PEPCID) 20 MG tablet Take 20 mg by mouth daily.   FLUZONE HIGH-DOSE QUADRIVALENT 0.7 ML SUSY    furosemide (LASIX) 20 MG tablet Take 20 mg by mouth as needed. Every other day as of 3/17 due to high K   GEMTESA 75 MG TABS Take 1 tablet by mouth daily.   metoprolol (LOPRESSOR) 50 MG tablet Take 25 mg by mouth 2 (two) times daily.   molnupiravir EUA (LAGEVRIO) 200 MG CAPS capsule Lagevrio 200 mg capsule (EUA)  TAKE 4 CAPSULES BY  MOUTH EVERY 12 HOURS FOR 5 DAYS   Multiple Vitamins-Minerals (MULTIPLE VITAMINS/WOMENS PO) Take 1 tablet by mouth daily. W/vitamin D   PFIZER-BIONT COVID-19 VAC-TRIS SUSP injection    No current facility-administered medications for this visit. (Other)   REVIEW OF SYSTEMS: ROS   Positive for: Cardiovascular, Eyes, Respiratory Negative for: Constitutional, Gastrointestinal, Neurological, Skin, Genitourinary, Musculoskeletal, HENT, Endocrine, Psychiatric, Allergic/Imm, Heme/Lymph Last edited by Kingsley Spittle, COT on 11/12/2021  8:16 AM.     ALLERGIES Allergies  Allergen Reactions   Meloxicam Hives   Nitrofurantoin    Other Other (See Comments)   Guaifenesin Er Rash    Other reaction(s): rash   Nitrofuran Derivatives Rash   PAST MEDICAL HISTORY Past Medical History:  Diagnosis Date   Anemia    Arthritis    Glaucoma    OU   Gout, joint    Hypertension    Hypertensive retinopathy    OU   Macular degeneration    OU   NSTEMI (non-ST elevated myocardial infarction) ([place]) 08/2015   Stroke [facility])    Past Surgical History:  Procedure Laterality Date   ABDOMINAL HYSTERECTOMY     BREAST SURGERY     CYST   CARDIAC CATHETERIZATION N/A 08/18/2015   Procedure: Left Heart Cath and Coronary Angiography;  Surgeon: [place] Sine, MD;  Location: [place] [facility];  Service: Cardiovascular;  Laterality: N/A;   CARDIAC CATHETERIZATION  08/18/2015   Procedure: Coronary Stent Intervention;  Surgeon: [place] Sine, MD;  Location: [place] [facility];  Service: Cardiovascular;;   CATARACT EXTRACTION Bilateral    DILATION AND CURETTAGE OF UTERUS     EYE SURGERY Bilateral    HERNIA REPAIR     IRIDOTOMY / IRIDECTOMY Right    JOINT REPLACEMENT  left knee   REFRACTIVE SURGERY Right 2017   FAMILY HISTORY Family History  Problem Relation Age of Onset   Heart disease Mother    Stroke Mother    SOCIAL HISTORY Social History   Tobacco Use   Smoking status: Never   Smokeless tobacco:  Never  Vaping Use   Vaping Use: Never used  Substance Use Topics   Alcohol use: No    Alcohol/week: 0.0 standard drinks   Drug use: No       OPHTHALMIC EXAM:  Base Eye Exam     Visual Acuity (Snellen - Linear)       Right Left   Dist cc HM 20/30   Dist ph cc  20/25 -1    Correction: Glasses         Tonometry (Tonopen, 8:29 AM)  Right Left   Pressure 10 14         Pupils       Dark Light Shape React APD   Right 2 1 Round Minimal None   Left 3 2 Round Brisk None         Visual Fields (Counting fingers)       Left Right    Full    Restrictions  Total superior temporal, inferior temporal, inferior nasal deficiencies; Partial inner superior nasal deficiency         Extraocular Movement       Right Left    Full, Ortho Full, Ortho         Neuro/Psych     Oriented x3: Yes   Mood/Affect: Normal         Dilation     Both eyes: 1.0% Mydriacyl, 2.5% Phenylephrine @ 8:29 AM           Slit Lamp and Fundus Exam     Slit Lamp Exam       Right Left   Lids/Lashes Dermatochalasis - upper lid, mild Telangiectasia Dermatochalasis - upper lid, mild Meibomian gland dysfunction   Conjunctiva/Sclera White and quiet Trace Injection   Cornea Arcus, 3+ edema w/ Descemet's folds, well healed temporal cataract wounds, 2+ fine endo pigment, 2-3+ Punctate epithelial erosions Arcus, 1-2+Punctate epithelial erosions, Well healed cataract wounds, tear film debris   Anterior Chamber Deep and clear, No cells or pigment Deep and quiet   Iris Round and moderately dilated, No NVI Round and poorly dilated to 4.46m   Lens PC IOL in good position with open PC PC IOL in good position with open PC   Anterior Vitreous Vitreous syneresis, +RBC, mild diffuse VH, vitreous condensations, +pigment Vitreous syneresis, Posterior vitreous detachment         Fundus Exam       Right Left   Disc Hazy view, 3+pallor, +cupping, thin inf rim, temp PPA, Sharp rim 1+ Pallor,  Sharp rim, +cupping, temporal Peripapillary atrophy   C/D Ratio 0.8 0.6   Macula Hazy view -- grossly flat, central pigment clump, mild fibrosis Flat, good foveal reflex, RPE mottling and clumping, drusen, No heme or edema   Vessels attenuated, Tortuous attenuated, Tortuous   Periphery Hazy view, grossly Attached, obscured by corneal edema Attached, No heme            Refraction     Wearing Rx       Sphere Cylinder Axis   Right -1.50 +2.25 173   Left -0.75 +2.25 166            IMAGING AND PROCEDURES  Imaging and Procedures for '@TODAY' @  OCT, Retina - OU - Both Eyes       Right Eye Quality was poor. Progression has worsened. Findings include abnormal foveal contour, no SRF, inner retinal atrophy, outer retinal atrophy, subretinal hyper-reflective material (Very poor image quality, retinal attached with apparent increase in retinal thickness/edema).   Left Eye Quality was borderline. Central Foveal Thickness: 251. Progression has been stable. Findings include normal foveal contour, no SRF, retinal drusen , outer retinal atrophy, no IRF (stable improvement of focal IRF SN fovea, patchy ORA).   Notes *Images captured and stored on drive  Diagnosis / Impression:  OD: Very poor image quality, retinal attached with apparent increase in retinal thickness/edema OS: NFP, no SRF, +drusen -- stable improvement of focal IRF SN fovea, patchy ORA  Clinical management:  See below  Abbreviations: NFP -  Normal foveal profile. CME - cystoid macular edema. PED - pigment epithelial detachment. IRF - intraretinal fluid. SRF - subretinal fluid. EZ - ellipsoid zone. ERM - epiretinal membrane. ORA - outer retinal atrophy. ORT - outer retinal tubulation. SRHM - subretinal hyper-reflective material            ASSESSMENT/PLAN:    ICD-10-CM   1. Branch retinal vein occlusion of right eye with macular edema  H34.8310     2. Vitreous hemorrhage of right eye ([address])  H43.11     3.  Exudative age-related macular degeneration of right eye with active choroidal neovascularization (HCC)  H35.3211 OCT, Retina - OU - Both Eyes    4. Intermediate stage nonexudative age-related macular degeneration of left eye  H35.3122     5. Essential hypertension  I10     6. Hypertensive retinopathy of both eyes  H35.033     7. Primary open angle glaucoma (POAG) of both eyes, severe stage  H40.1133     8. Pseudophakia of both eyes  Z96.1     9. Dry eyes, bilateral  H04.123     10. Corneal edema  H18.20       1,2. Inferior HRVO w/ CME and VH, OD  - pt lost to f/u from 10/16/2019 to 07/14/2020 (9 mos)  - presenting BCVA CF 2' (09.14.20)  - pt subjectively reports decline in vision OD since Feb 2020  - presenting OCT shows severe CME/IRF temporal macula, +SRF overlying low PED (?exudative ARMD component)  - S/p IVA OD #1 09.14.20, #2 (10.20.20), #3 (12.02.20), #4 (01.06.21), #5 (10.05.21), #6 (11.03.21), #7 (12.01.21), #8 (12.29.22), #9 (04.13.22), #10 (05.11.22), #11 (06.16.22), #12 (07.21.22)  - OCT today shows poor image quality, retina attached with diffuse atrophy + central edema  - BCVA remains CF  - discussed findings, poor prognosis and treatment options   - recommend holding IVA OD today  - f/u 3-4 mos, DFE, OCT  3. Exudative age related macular degeneration, OD    - initial OCT showed +IRF/SRF -- SRF overlying low lying PED  - original exam shows +CNVM with surrounding heme --stably improved today  - suspect exudative ARMD component complicating HRVO w/ CME  - S/p IVA OD #1 (09.14.20), #2 (10.20.20), #3 (12.02.20), #4 (01.06.21), #5 (10.05.21), #6 (11.03.21), #7 (12.01.21), #8 (12.29.22), #9 (04.13.22), #10 (05.11.22), #11 (06.16.22), #12 (07.12.22)  - recommend  holding IVA OD today as above   - f/u 3-4 months -- DFE/OCT, possible injection  4. Age related macular degeneration, non-exudative, OS  - OCT shows stable improvement of focal cystic changes  - no heme or edema  visible on exam  - recommend close observation   - cont Amsler grid monitoring   - f/u 3-4 mos  5,6. Hypertensive retinopathy OU  - discussed importance of tight BP control  - monitor   7. POAG OU -- severe stage  - under the expert management of Dr. Midge Aver  - s/p SLT OD 3.23.2017  - s/p TDC OD w/ [name], 3.30.21  - IOP OD 11, OS good at 12  - Brimonidine BID OU   - monitor   8. Pseudophakia OU  - s/p CE/IOL OU  - beautiful surgeries, doing well  - monitor   9. Dry eyes OU (OD > OS)  - recommend artificial tears and lubricating ointment as needed  10. Corneal edema OD  - 3+ Descemet folds and mild haze   - recommend treatment to improve monitoring of retina  and posterior pole   - cont Lotemax SM OD  6x/day  - cont Muro 128 gtts QID OD  - cont Muro 128 ung qhs OD  - monitor    Ophthalmic Meds Ordered this visit:  No orders of the defined types were placed in this encounter.    Return for f/u 3 months, BRVO OD, DFE, OCT.  There are no Patient Instructions on file for this visit.   Explained the diagnoses, plan, and follow up with the patient and they expressed understanding.  Patient expressed understanding of the importance of proper follow up care.   This document serves as a record of services personally performed by Gardiner Sleeper, MD, PhD. It was created on their behalf by Leonie Douglas, an ophthalmic technician. The creation of this record is the provider's dictation and/or activities during the visit.    Electronically signed by: Leonie Douglas COA, 11/16/21  12:06 AM  This document serves as a record of services personally performed by Gardiner Sleeper, MD, PhD. It was created on their behalf by San Jetty. Owens Shark, OA an ophthalmic technician. The creation of this record is the provider's dictation and/or activities during the visit.    Electronically signed by: San Jetty. Owens Shark, [place] 02.03.2023 12:06 AM  Gardiner Sleeper, M.D., Ph.D. Diseases & Surgery of the  Retina and [place] 11/12/2021  I have reviewed the above documentation for accuracy and completeness, and I agree with the above. Gardiner Sleeper, M.D., Ph.D. 11/16/21 12:10 AM  Abbreviations: M myopia (nearsighted); A astigmatism; H hyperopia (farsighted); P presbyopia; Mrx spectacle prescription;  CTL contact lenses; OD right eye; OS left eye; OU both eyes  XT exotropia; ET esotropia; PEK punctate epithelial keratitis; PEE punctate epithelial erosions; DES dry eye syndrome; MGD meibomian gland dysfunction; ATs artificial tears; PFAT's preservative free artificial tears; [place] nuclear sclerotic cataract; PSC posterior subcapsular cataract; ERM epi-retinal membrane; PVD posterior vitreous detachment; RD retinal detachment; DM diabetes mellitus; DR diabetic retinopathy; NPDR non-proliferative diabetic retinopathy; PDR proliferative diabetic retinopathy; CSME clinically significant macular edema; DME diabetic macular edema; dbh dot blot hemorrhages; CWS cotton wool spot; POAG primary open angle glaucoma; C/D cup-to-disc ratio; HVF humphrey visual field; GVF goldmann visual field; OCT optical coherence tomography; IOP intraocular pressure; BRVO Branch retinal vein occlusion; CRVO central retinal vein occlusion; CRAO central retinal artery occlusion; BRAO branch retinal artery occlusion; RT retinal tear; SB scleral buckle; PPV pars plana vitrectomy; VH Vitreous hemorrhage; PRP panretinal laser photocoagulation; IVK intravitreal kenalog; VMT vitreomacular traction; MH Macular hole;  NVD neovascularization of the disc; NVE neovascularization elsewhere; AREDS age related eye disease study; ARMD age related macular degeneration; POAG primary open angle glaucoma; EBMD epithelial/anterior basement membrane dystrophy; ACIOL anterior chamber intraocular lens; IOL intraocular lens; PCIOL posterior chamber intraocular lens; Phaco/IOL phacoemulsification with intraocular lens placement; [place]  photorefractive keratectomy; LASIK laser assisted in situ keratomileusis; HTN hypertension; DM diabetes mellitus; COPD chronic obstructive pulmonary disease

## 2021-11-11 DIAGNOSIS — R350 Frequency of micturition: Secondary | ICD-10-CM | POA: Diagnosis not present

## 2021-11-12 ENCOUNTER — Ambulatory Visit (INDEPENDENT_AMBULATORY_CARE_PROVIDER_SITE_OTHER): Payer: Medicare Other | Admitting: Ophthalmology

## 2021-11-12 ENCOUNTER — Other Ambulatory Visit: Payer: Self-pay

## 2021-11-12 ENCOUNTER — Encounter (INDEPENDENT_AMBULATORY_CARE_PROVIDER_SITE_OTHER): Payer: Self-pay | Admitting: Ophthalmology

## 2021-11-12 DIAGNOSIS — Z961 Presence of intraocular lens: Secondary | ICD-10-CM | POA: Diagnosis not present

## 2021-11-12 DIAGNOSIS — H401133 Primary open-angle glaucoma, bilateral, severe stage: Secondary | ICD-10-CM | POA: Diagnosis not present

## 2021-11-12 DIAGNOSIS — H04123 Dry eye syndrome of bilateral lacrimal glands: Secondary | ICD-10-CM

## 2021-11-12 DIAGNOSIS — H34831 Tributary (branch) retinal vein occlusion, right eye, with macular edema: Secondary | ICD-10-CM

## 2021-11-12 DIAGNOSIS — H353122 Nonexudative age-related macular degeneration, left eye, intermediate dry stage: Secondary | ICD-10-CM

## 2021-11-12 DIAGNOSIS — H35033 Hypertensive retinopathy, bilateral: Secondary | ICD-10-CM | POA: Diagnosis not present

## 2021-11-12 DIAGNOSIS — H1820 Unspecified corneal edema: Secondary | ICD-10-CM

## 2021-11-12 DIAGNOSIS — H353211 Exudative age-related macular degeneration, right eye, with active choroidal neovascularization: Secondary | ICD-10-CM | POA: Diagnosis not present

## 2021-11-12 DIAGNOSIS — I10 Essential (primary) hypertension: Secondary | ICD-10-CM | POA: Diagnosis not present

## 2021-11-12 DIAGNOSIS — H4311 Vitreous hemorrhage, right eye: Secondary | ICD-10-CM

## 2021-11-16 ENCOUNTER — Encounter (INDEPENDENT_AMBULATORY_CARE_PROVIDER_SITE_OTHER): Payer: Self-pay | Admitting: Ophthalmology

## 2021-11-29 ENCOUNTER — Other Ambulatory Visit: Payer: Self-pay

## 2021-11-29 ENCOUNTER — Ambulatory Visit: Payer: Medicare Other | Admitting: Podiatry

## 2021-11-29 DIAGNOSIS — L97512 Non-pressure chronic ulcer of other part of right foot with fat layer exposed: Secondary | ICD-10-CM

## 2021-11-29 DIAGNOSIS — B351 Tinea unguium: Secondary | ICD-10-CM

## 2021-11-29 DIAGNOSIS — E0843 Diabetes mellitus due to underlying condition with diabetic autonomic (poly)neuropathy: Secondary | ICD-10-CM | POA: Diagnosis not present

## 2021-11-29 DIAGNOSIS — M79675 Pain in left toe(s): Secondary | ICD-10-CM

## 2021-11-29 DIAGNOSIS — M79674 Pain in right toe(s): Secondary | ICD-10-CM

## 2021-11-29 MED ORDER — GENTAMICIN SULFATE 0.1 % EX CREA: 1.0000 "application " | TOPICAL_CREAM | Freq: Two times a day (BID) | CUTANEOUS | 1 refills | Status: DC

## 2021-11-29 NOTE — Progress Notes (Signed)
° °  Subjective:  88 y.o. female with PMHx of diabetes mellitus presenting today with her daughter for follow-up routine foot care.  Patient's daughter states that there has been some drainage coming from the right foot.  They present for further treatment and evaluation  Past Medical History:  Diagnosis Date   Anemia    Arthritis    Glaucoma    OU   Gout, joint    Hypertension    Hypertensive retinopathy    OU   Macular degeneration    OU   NSTEMI (non-ST elevated myocardial infarction) (HCC) 08/2015   Stroke [facility])         Objective/Physical Exam General: The patient is alert and oriented x3 in no acute distress.  Dermatology:  Wound #1 noted to the right plantar forefoot measuring approximately 0.5x0.6 x 0.1 cm (LxWxD).  The wound continues to be stable.  Last visit the wound is mostly healed but over the past 6 weeks callus and recurrence of the wound developed  To the noted ulceration(s), there is no eschar. There is a moderate amount of slough, fibrin, and necrotic tissue noted. Granulation tissue and wound base is red. There is a minimal amount of serosanguineous drainage noted. There is no exposed bone muscle-tendon ligament or joint. There is no malodor. Periwound integrity is intact.  Hyperkeratotic elongated nails noted 1-5 bilateral  Vascular: Palpable pedal pulses bilaterally. No edema or erythema noted. Capillary refill within normal limits.  Neurological: Epicritic and protective threshold diminished bilaterally.   Musculoskeletal Exam: Fat pad atrophy with tenderness to palpation right forefoot.  Palpation of the metatarsal heads noted specifically to the subsecond and third MTPJ  Assessment: 1.  Ulcer right plantar forefoot secondary to diabetes mellitus 2. diabetes mellitus w/ peripheral neuropathy 3.  Pain due to onychomycosis of toenails bilateral   Plan of Care:  1. Patient was evaluated.   2. medically necessary excisional debridement including  subcutaneous tissue was performed using a tissue nipper and a chisel blade. Excisional debridement of all the necrotic nonviable tissue down to healthy bleeding viable tissue was performed with post-debridement measurements same as pre-. 3. the wound was cleansed and dry sterile dressing applied. 4.  Continue offloading felt pad to the insoles of the diabetic shoe right foot.  An additional offloading felt pad was applied to the insole today 5.  Prescription for gentamicin cream applied daily 6.  Mechanical debridement of nails 1-5 bilateral was performed using a nail nipper without incident or bleeding  7.  Return to clinic 4 weeks for monthly routine foot care and follow-up x-rays  Felecia Shelling, DPM [facility]  Dr. Felecia Shelling, DPM    [address] [place], [place] [address]                Office [phone]  Fax [phone]

## 2021-12-08 ENCOUNTER — Encounter: Payer: Self-pay | Admitting: Podiatry

## 2021-12-09 DIAGNOSIS — R350 Frequency of micturition: Secondary | ICD-10-CM | POA: Diagnosis not present

## 2021-12-27 ENCOUNTER — Other Ambulatory Visit: Payer: Self-pay

## 2021-12-27 ENCOUNTER — Ambulatory Visit: Payer: Medicare Other | Admitting: Podiatry

## 2021-12-27 ENCOUNTER — Ambulatory Visit (INDEPENDENT_AMBULATORY_CARE_PROVIDER_SITE_OTHER): Payer: Medicare Other

## 2021-12-27 DIAGNOSIS — E0843 Diabetes mellitus due to underlying condition with diabetic autonomic (poly)neuropathy: Secondary | ICD-10-CM | POA: Diagnosis not present

## 2021-12-27 DIAGNOSIS — L97512 Non-pressure chronic ulcer of other part of right foot with fat layer exposed: Secondary | ICD-10-CM

## 2021-12-27 NOTE — Progress Notes (Signed)
? ?Subjective:  ?88 y.o. female with PMHx of diabetes mellitus presenting today with her daughter for follow-up routine foot care.  Patient's daughter states that there has been some drainage coming from the right foot.  They present for further treatment and evaluation ? ?Past Medical History:  ?Diagnosis Date  ? Anemia   ? Arthritis   ? Glaucoma   ? OU  ? Gout, joint   ? Hypertension   ? Hypertensive retinopathy   ? OU  ? Macular degeneration   ? OU  ? NSTEMI (non-ST elevated myocardial infarction) (HCC) 08/2015  ? Stroke [facility])   ? ?Past Surgical History:  ?Procedure Laterality Date  ? ABDOMINAL HYSTERECTOMY    ? BREAST SURGERY    ? CYST  ? CARDIAC CATHETERIZATION N/A 08/18/2015  ? Procedure: Left Heart Cath and Coronary Angiography;  Surgeon: Lennette Bihari, MD;  Location: [facility];  Service: Cardiovascular;  Laterality: N/A;  ? CARDIAC CATHETERIZATION  08/18/2015  ? Procedure: Coronary Stent Intervention;  Surgeon: Lennette Bihari, MD;  Location: [facility];  Service: Cardiovascular;;  ? CATARACT EXTRACTION Bilateral   ? DILATION AND CURETTAGE OF UTERUS    ? EYE SURGERY Bilateral   ? HERNIA REPAIR    ? IRIDOTOMY / IRIDECTOMY Right   ? JOINT REPLACEMENT  left knee  ? REFRACTIVE SURGERY Right 2017  ? ?Allergies  ?Allergen Reactions  ? Meloxicam Hives  ? Nitrofurantoin   ? Other Other (See Comments)  ? Guaifenesin Er Rash  ?  Other reaction(s): rash  ? Nitrofuran Derivatives Rash  ? ?  ?Objective/Physical Exam ?General: The patient is alert and oriented x3 in no acute distress. ? ?Dermatology:  ?Wound #1 noted to the right plantar forefoot which continues to measure approximately 0.5x0.6 x 0.2 cm (LxWxD).  Periwound callus noted. ?To the noted ulceration(s), there is no eschar. There is a moderate amount of slough, fibrin, and necrotic tissue noted. Granulation tissue and wound base is red. There is a minimal amount of serosanguineous drainage noted. There is no exposed bone muscle-tendon ligament  or joint. There is no malodor.  ? ? ?Vascular: Palpable pedal pulses bilaterally. No edema or erythema noted. Capillary refill within normal limits. ? ?Neurological: Epicritic and protective threshold diminished bilaterally.  ? ?Musculoskeletal Exam: Fat pad atrophy with tenderness to palpation right forefoot.  Palpation of the metatarsal heads noted specifically to the subsecond and third MTPJ ? ?Assessment: ?1.  Ulcer right plantar forefoot secondary to diabetes mellitus ?2. diabetes mellitus w/ peripheral neuropathy ? ? ?Plan of Care:  ?1. Patient was evaluated.  Today explained with the patient and the daughter the radiographic findings of dislocated toe which is likely plantar flexing the metatarsal head creating the wounds.  I suspect that offloading alone will not completely resolve and heal the ulcer to the forefoot.  We did lightly discuss surgery as an option however the patient and daughter do not want to pursue any surgical intervention given the patient's age.  We will continue conservative treatment for now ?2. medically necessary excisional debridement including subcutaneous tissue was performed using a tissue nipper and a chisel blade. Excisional debridement of all the necrotic nonviable tissue down to healthy bleeding viable tissue was performed with post-debridement measurements same as pre-. ?3. the wound was cleansed and dry sterile dressing applied. ?4.  Continue offloading felt pad to the insoles of the diabetic shoe right foot.   ?5.  Continue gentamicin cream daily. ?6.  Return to  clinic monthly ? ?Felecia Shelling, DPM ?[facility] ? ?Dr. Felecia Shelling, DPM  ?  ?[address]                                       ?[place], [place] [address]                ?Office [phone]  ?Fax [phone] ? ? ? ? ?

## 2022-01-02 ENCOUNTER — Emergency Department (HOSPITAL_BASED_OUTPATIENT_CLINIC_OR_DEPARTMENT_OTHER)
Admission: EM | Admit: 2022-01-02 | Discharge: 2022-01-02 | Disposition: A | Discharge status: Home / Self Care | Payer: Medicare Other | Attending: Emergency Medicine | Admitting: Emergency Medicine

## 2022-01-02 ENCOUNTER — Other Ambulatory Visit: Payer: Self-pay

## 2022-01-02 ENCOUNTER — Encounter (HOSPITAL_BASED_OUTPATIENT_CLINIC_OR_DEPARTMENT_OTHER): Payer: Self-pay

## 2022-01-02 DIAGNOSIS — S51812A Laceration without foreign body of left forearm, initial encounter: Secondary | ICD-10-CM | POA: Insufficient documentation

## 2022-01-02 DIAGNOSIS — Z79899 Other long term (current) drug therapy: Secondary | ICD-10-CM | POA: Insufficient documentation

## 2022-01-02 DIAGNOSIS — W010XXA Fall on same level from slipping, tripping and stumbling without subsequent striking against object, initial encounter: Secondary | ICD-10-CM | POA: Diagnosis not present

## 2022-01-02 DIAGNOSIS — S6992XA Unspecified injury of left wrist, hand and finger(s), initial encounter: Secondary | ICD-10-CM | POA: Diagnosis present

## 2022-01-02 NOTE — ED Notes (Signed)
Pt and family understood the paperwork. ?

## 2022-01-02 NOTE — ED Triage Notes (Signed)
She tells [place] that she slipped in her bathroom, thereby striking her left proximal forearm on the cornier of the sink. She has a skin tear there, on which we apply a new dressing.she is alert and oriented. She denies any head injury or l.o.c. ?

## 2022-01-02 NOTE — Discharge Instructions (Signed)
Leave strips in place for the next 6 days ?

## 2022-01-02 NOTE — ED Notes (Signed)
Provider was now okay to discharge with the current V/S. ?

## 2022-01-02 NOTE — ED Notes (Signed)
Pt awaiting further eval prior to d/c ?

## 2022-01-05 DIAGNOSIS — M25512 Pain in left shoulder: Secondary | ICD-10-CM | POA: Diagnosis not present

## 2022-01-05 DIAGNOSIS — M25511 Pain in right shoulder: Secondary | ICD-10-CM | POA: Diagnosis not present

## 2022-01-06 NOTE — ED Provider Notes (Signed)
?[facility] EMERGENCY DEPT ?Provider Note ? ? ?CSN: [id number] ?Arrival date & time: 01/02/22  1650 ? ?  ? ?History ? ?Chief Complaint  ?Patient presents with  ? Skin tear left forearm  ? ? ?[name] is a 88 y.o. female. ? ?Reports she slipped in the bathroom yesterday and struck her arm.  Patient reports area has continued to bleed.  Patient's family member has tried bandaging but the area bled through the dressing .patient does not feel like she struck anything hard enough to break anything she states that she just hit and tore the skin.  Patient denies any swelling she denies any current pain.  Patient reports she did not strike her head she did not lose consciousness ? ?The history is provided by the patient. No language interpreter was used.  ?Arm Injury ?Location:  Arm ?Arm location:  L forearm ?Injury: yes   ?Foreign body present:  No foreign bodies ?Tetanus status:  Up to date ?Relieved by:  Nothing ? ?  ? ?Home Medications ?Prior to Admission medications   ?Medication Sig Start Date End Date Taking? Authorizing Provider  ?Acetaminophen 325 MG CAPS Take 325 mg by mouth 3 (three) times daily as needed.    [provider]  ?amoxicillin (AMOXIL) 500 MG capsule SMARTSIG:4 Capsule(s) By Mouth 08/21/21   [provider]  ?aspirin 81 MG chewable tablet Chew 81 mg by mouth daily.    [provider]  ?atorvastatin (LIPITOR) 80 MG tablet Take 1 tablet (80 mg total) by mouth daily at 6 PM. 08/20/15   Ghimire, Werner Lean, MD  ?atorvastatin (LIPITOR) 80 MG tablet Take 1 tablet by mouth daily.    [provider]  ?brimonidine (ALPHAGAN) 0.2 % ophthalmic solution brimonidine 0.2 % eye drops ? INSTILL 1 DROP INTO BOTH EYES TWICE A DAY    [provider]  ?brimonidine (ALPHAGAN) 0.2 % ophthalmic solution 1 drop into affected eye    [provider]  ?Calcium Carbonate-Vitamin D (CALTRATE 600+D PO) Take 1 tablet by mouth 2 (two) times daily.    [provider]  ?cephALEXin (KEFLEX) 250 MG capsule Take 250 mg by mouth at bedtime. 11/10/20   [provider]  ?chlorpheniramine (CHLOR-TRIMETON) 4 MG tablet Take 4 mg by mouth every 4 (four) hours as needed for allergies.    [provider]  ?denosumab (PROLIA) 60 MG/ML SOSY injection See admin instructions. 12/18/17   [provider]  ?diclofenac sodium (VOLTAREN) 1 % GEL diclofenac 1 % topical gel ? AS DIRECTED TWICE A DAY AS NEEDED TRANSDERMAL 14 DAYS    [provider]  ?dorzolamide-timolol (COSOPT) 22.3-6.8 MG/ML ophthalmic solution Place 1 drop into both eyes 2 (two) times daily.  ?Patient not taking: Reported on 11/12/2021    [provider]  ?famotidine (PEPCID) 20 MG tablet Take 20 mg by mouth daily.    [provider]  ?FLUZONE HIGH-DOSE QUADRIVALENT 0.7 ML SUSY  07/14/19   [provider]  ?furosemide (LASIX) 20 MG tablet Take 20 mg by mouth as needed. Every other day as of 3/17 due to high K    [provider]  ?GEMTESA 75 MG TABS Take 1 tablet by mouth daily. 10/15/21   [provider]  ?gentamicin cream (GARAMYCIN) 0.1 % Apply 1 application topically 2 (two) times daily. 11/29/21   Felecia Shelling, DPM  ?latanoprost (XALATAN) 0.005 % ophthalmic solution latanoprost 0.005 % eye drops ? INSTILL 1 DROP INTO RIGHT EYE AT BEDTIME  [provider]  ?LOTEMAX SM 0.38 % GEL PLACE 1 DROP INTO THE RIGHT EYE 3 TIMES DAILY. 07/08/21   Rennis ChrisZamora, Brian, MD  ?Peter CongoLOTEMAX SM 0.38 % GEL INSTILL 1 DROP SIX TIMES A DAY IN RIGHT EYE. 11/07/21   Rennis ChrisZamora, Brian, MD  ?metoprolol (LOPRESSOR) 50 MG tablet Take 25 mg by mouth 2 (two) times daily.    [provider]  ?molnupiravir EUA (LAGEVRIO) 200 MG CAPS capsule Lagevrio 200 mg capsule (EUA) ? TAKE 4 CAPSULES BY MOUTH EVERY 12 HOURS FOR 5 DAYS    [provider]  ?Multiple Vitamins-Minerals (MULTIPLE VITAMINS/WOMENS PO) Take 1 tablet by mouth daily. W/vitamin D    [provider]  ?PFIZER-BIONT COVID-19 VAC-TRIS SUSP injection  05/08/21   [provider]  ?Polyethyl Glycol-Propyl Glycol (SYSTANE OP) Apply 1 drop to eye 2 (two) times daily.    [provider]  ?sodium chloride (MURO 128) 5 % ophthalmic ointment Place 1 application into the right eye at bedtime. 06/04/21   Rennis ChrisZamora, Brian, MD  ?sodium chloride (MURO 128) 5 % ophthalmic solution INSTILL 1 DROP INTO RIGHT EYE 4 TIMES A DAY 10/06/21   Rennis ChrisZamora, Brian, MD  ?timolol (BETIMOL) 0.25 % ophthalmic solution Place 1 drop into both eyes daily.    [provider]  ?   ? ?Allergies    ?Meloxicam, Nitrofurantoin, Other, Guaifenesin er, and Nitrofuran derivatives   ? ?Review of Systems   ?Review of Systems  ?All other systems reviewed and are negative. ? ?Physical Exam ?Updated Vital Signs ?BP (!) 183/84 (BP Location: Right Arm)   Pulse 67   Temp 98.6 ?F (37 ?C)   Resp 18   SpO2 100%  ?Physical Exam ?Vitals reviewed.  ?Constitutional:   ?   Appearance: Normal appearance.  ?HENT:  ?   Head: Normocephalic.  ?Cardiovascular:  ?   Rate and Rhythm: Normal rate.  ?Pulmonary:  ?   Effort: Pulmonary effort is normal.  ?Musculoskeletal:  ?   Comments: 8 cm superficial skin tear left forearm, no gaping full range of motion of forearm neurovascular neurosensory are intact  ?Neurological:  ?   General: No focal deficit present.  ?   Mental Status: She is alert and oriented to person, place, and time.  ?Psychiatric:     ?   Mood and Affect: Mood normal.  ? ? ?ED Results / Procedures / Treatments   ?Labs ?(all labs ordered are listed, but only abnormal results are displayed) ?Labs Reviewed - No data to display ? ?EKG ?None ? ?Radiology ?No results found. ? ?Procedures ?Marland Kitchen..Laceration Repair ? ?Date/Time: 01/06/2022 10:40 AM ?Performed by: Elson AreasSofia, Rasheed Welty K, PA-C ?Authorized by: Elson AreasSofia, Madiha Bambrick K, PA-C  ? ?Consent:  ?  Consent obtained:  Verbal ?  Consent given by:  Patient ?  Risks, benefits, and alternatives were  discussed: yes   ?  Alternatives discussed:  No treatment ?Universal protocol:  ?  Immediately prior to procedure, a time out was called: yes   ?Laceration details:  ?  Location:  Shoulder/arm ?  Shoulder/arm location:  L lower arm ?  Length (cm):  8 ?Treatment:  ?  Area cleansed with:  Povidone-iodine ?  Irrigation solution:  Sterile saline ?Skin repair:  ?  Repair method:  Steri-Strips ?  Number of Steri-Strips:  6 ?Approximation:  ?  Approximation:  Loose ?Repair type:  ?  Repair type:  Simple ?Post-procedure details:  ?  Procedure completion:  Tolerated ?Comments:  ?   Bleeding  controlled and skin flap stretched over wound  ? ? ?Medications Ordered in ED ?Medications - No data to display ? ?ED Course/ Medical Decision Making/ A&P ?  ?                        ?Medical Decision Making ? ?MDM wound care options discussed with patient and daughter.  Peri-Strips chosen to decrease bleeding and to reapproximate skin skin is very thin.  Bleeding controlled patient and family counseled on wound care patient is stable for discharge ? ? ? ? ? ? ? ?Final Clinical Impression(s) / ED Diagnoses ?Final diagnoses:  ?Laceration of left forearm, initial encounter  ? ? ?Rx / DC Orders ?ED Discharge Orders   ? ? None  ? ?  ? ?An After Visit Summary was printed and given to the patient.  ?  ?Elson Areas, PA-C ?01/06/22 1043 ? ?  ?Franne Forts, DO ?01/07/22 0865 ? ?

## 2022-01-07 DIAGNOSIS — M1711 Unilateral primary osteoarthritis, right knee: Secondary | ICD-10-CM | POA: Diagnosis not present

## 2022-01-13 ENCOUNTER — Emergency Department (HOSPITAL_COMMUNITY): Payer: Medicare Other

## 2022-01-13 ENCOUNTER — Encounter (INDEPENDENT_AMBULATORY_CARE_PROVIDER_SITE_OTHER): Payer: Self-pay | Admitting: Ophthalmology

## 2022-01-13 ENCOUNTER — Inpatient Hospital Stay (HOSPITAL_COMMUNITY)
Admission: EM | Admit: 2022-01-13 | Discharge: 2022-01-17 | DRG: 690 | Disposition: A | Discharge status: Home Health | Payer: Medicare Other | Attending: Internal Medicine | Admitting: Internal Medicine

## 2022-01-13 ENCOUNTER — Other Ambulatory Visit: Payer: Self-pay

## 2022-01-13 DIAGNOSIS — R350 Frequency of micturition: Secondary | ICD-10-CM | POA: Diagnosis not present

## 2022-01-13 DIAGNOSIS — I5032 Chronic diastolic (congestive) heart failure: Secondary | ICD-10-CM | POA: Diagnosis present

## 2022-01-13 DIAGNOSIS — Z888 Allergy status to other drugs, medicaments and biological substances status: Secondary | ICD-10-CM | POA: Diagnosis not present

## 2022-01-13 DIAGNOSIS — N1832 Chronic kidney disease, stage 3b: Secondary | ICD-10-CM | POA: Diagnosis present

## 2022-01-13 DIAGNOSIS — I6381 Other cerebral infarction due to occlusion or stenosis of small artery: Secondary | ICD-10-CM | POA: Diagnosis not present

## 2022-01-13 DIAGNOSIS — E875 Hyperkalemia: Secondary | ICD-10-CM | POA: Diagnosis not present

## 2022-01-13 DIAGNOSIS — K219 Gastro-esophageal reflux disease without esophagitis: Secondary | ICD-10-CM | POA: Diagnosis present

## 2022-01-13 DIAGNOSIS — I160 Hypertensive urgency: Secondary | ICD-10-CM

## 2022-01-13 DIAGNOSIS — N183 Chronic kidney disease, stage 3 unspecified: Secondary | ICD-10-CM | POA: Diagnosis not present

## 2022-01-13 DIAGNOSIS — I10 Essential (primary) hypertension: Secondary | ICD-10-CM | POA: Diagnosis present

## 2022-01-13 DIAGNOSIS — Z79899 Other long term (current) drug therapy: Secondary | ICD-10-CM | POA: Diagnosis not present

## 2022-01-13 DIAGNOSIS — B961 Klebsiella pneumoniae [K. pneumoniae] as the cause of diseases classified elsewhere: Secondary | ICD-10-CM | POA: Diagnosis present

## 2022-01-13 DIAGNOSIS — I2583 Coronary atherosclerosis due to lipid rich plaque: Secondary | ICD-10-CM | POA: Diagnosis not present

## 2022-01-13 DIAGNOSIS — E785 Hyperlipidemia, unspecified: Secondary | ICD-10-CM | POA: Diagnosis not present

## 2022-01-13 DIAGNOSIS — R531 Weakness: Secondary | ICD-10-CM | POA: Diagnosis not present

## 2022-01-13 DIAGNOSIS — H409 Unspecified glaucoma: Secondary | ICD-10-CM

## 2022-01-13 DIAGNOSIS — I130 Hypertensive heart and chronic kidney disease with heart failure and stage 1 through stage 4 chronic kidney disease, or unspecified chronic kidney disease: Secondary | ICD-10-CM | POA: Diagnosis present

## 2022-01-13 DIAGNOSIS — N3090 Cystitis, unspecified without hematuria: Secondary | ICD-10-CM | POA: Diagnosis present

## 2022-01-13 DIAGNOSIS — Z823 Family history of stroke: Secondary | ICD-10-CM | POA: Diagnosis not present

## 2022-01-13 DIAGNOSIS — Z7982 Long term (current) use of aspirin: Secondary | ICD-10-CM

## 2022-01-13 DIAGNOSIS — Z20822 Contact with and (suspected) exposure to covid-19: Secondary | ICD-10-CM | POA: Diagnosis present

## 2022-01-13 DIAGNOSIS — Z87440 Personal history of urinary (tract) infections: Secondary | ICD-10-CM

## 2022-01-13 DIAGNOSIS — E7800 Pure hypercholesterolemia, unspecified: Secondary | ICD-10-CM | POA: Diagnosis not present

## 2022-01-13 DIAGNOSIS — M109 Gout, unspecified: Secondary | ICD-10-CM | POA: Diagnosis present

## 2022-01-13 DIAGNOSIS — Z90710 Acquired absence of both cervix and uterus: Secondary | ICD-10-CM | POA: Diagnosis not present

## 2022-01-13 DIAGNOSIS — Z8249 Family history of ischemic heart disease and other diseases of the circulatory system: Secondary | ICD-10-CM

## 2022-01-13 DIAGNOSIS — K5730 Diverticulosis of large intestine without perforation or abscess without bleeding: Secondary | ICD-10-CM | POA: Diagnosis not present

## 2022-01-13 DIAGNOSIS — I2510 Atherosclerotic heart disease of native coronary artery without angina pectoris: Secondary | ICD-10-CM | POA: Diagnosis present

## 2022-01-13 DIAGNOSIS — H3530 Unspecified macular degeneration: Secondary | ICD-10-CM | POA: Diagnosis not present

## 2022-01-13 DIAGNOSIS — Z8673 Personal history of transient ischemic attack (TIA), and cerebral infarction without residual deficits: Secondary | ICD-10-CM | POA: Diagnosis not present

## 2022-01-13 DIAGNOSIS — I252 Old myocardial infarction: Secondary | ICD-10-CM | POA: Diagnosis not present

## 2022-01-13 DIAGNOSIS — N390 Urinary tract infection, site not specified: Secondary | ICD-10-CM | POA: Diagnosis present

## 2022-01-13 DIAGNOSIS — N200 Calculus of kidney: Secondary | ICD-10-CM | POA: Diagnosis not present

## 2022-01-13 DIAGNOSIS — R6889 Other general symptoms and signs: Secondary | ICD-10-CM | POA: Diagnosis not present

## 2022-01-13 DIAGNOSIS — Z66 Do not resuscitate: Secondary | ICD-10-CM | POA: Diagnosis present

## 2022-01-13 DIAGNOSIS — N3001 Acute cystitis with hematuria: Secondary | ICD-10-CM | POA: Diagnosis not present

## 2022-01-13 DIAGNOSIS — N281 Cyst of kidney, acquired: Secondary | ICD-10-CM | POA: Diagnosis not present

## 2022-01-13 DIAGNOSIS — Z743 Need for continuous supervision: Secondary | ICD-10-CM | POA: Diagnosis not present

## 2022-01-13 HISTORY — DX: Cerebral infarction due to embolism of bilateral vertebral arteries: I63.113

## 2022-01-13 LAB — CBG MONITORING, ED: Glucose-Capillary: 129 mg/dL — ABNORMAL HIGH (ref 70–99)

## 2022-01-13 LAB — URINALYSIS, ROUTINE W REFLEX MICROSCOPIC
Bilirubin Urine: NEGATIVE
Glucose, UA: NEGATIVE mg/dL
Ketones, ur: NEGATIVE mg/dL
Nitrite: NEGATIVE
Protein, ur: 100 mg/dL — AB
RBC / HPF: >50 RBC/hpf — ABNORMAL HIGH (ref 0–5)
Specific Gravity, Urine: 1.009 (ref 1.005–1.030)
pH: 6 (ref 5.0–8.0)

## 2022-01-13 LAB — CBC
HCT: 40.9 % (ref 36.0–46.0)
Hemoglobin: 12.7 g/dL (ref 12.0–15.0)
MCH: 28.3 pg (ref 26.0–34.0)
MCHC: 31.1 g/dL (ref 30.0–36.0)
MCV: 91.3 fL (ref 80.0–100.0)
Platelets: 322 10*3/uL (ref 150–400)
RBC: 4.48 MIL/uL (ref 3.87–5.11)
RDW: 15.3 % (ref 11.5–15.5)
WBC: 16.5 10*3/uL — ABNORMAL HIGH (ref 4.0–10.5)
nRBC: 0 % (ref 0.0–0.2)

## 2022-01-13 LAB — PHOSPHORUS: Phosphorus: 3.8 mg/dL (ref 2.5–4.6)

## 2022-01-13 LAB — I-STAT CHEM 8, ED
BUN: 43 mg/dL — ABNORMAL HIGH (ref 8–23)
Calcium, Ion: 1.1 mmol/L — ABNORMAL LOW (ref 1.15–1.40)
Chloride: 110 mmol/L (ref 98–111)
Creatinine, Ser: 1.5 mg/dL — ABNORMAL HIGH (ref 0.44–1.00)
Glucose, Bld: 129 mg/dL — ABNORMAL HIGH (ref 70–99)
HCT: 41 % (ref 36.0–46.0)
Hemoglobin: 13.9 g/dL (ref 12.0–15.0)
Potassium: 4.8 mmol/L (ref 3.5–5.1)
Sodium: 138 mmol/L (ref 135–145)
TCO2: 20 mmol/L — ABNORMAL LOW (ref 22–32)

## 2022-01-13 LAB — BASIC METABOLIC PANEL
Anion gap: 8 (ref 5–15)
BUN: 49 mg/dL — ABNORMAL HIGH (ref 8–23)
CO2: 19 mmol/L — ABNORMAL LOW (ref 22–32)
Calcium: 9.1 mg/dL (ref 8.9–10.3)
Chloride: 112 mmol/L — ABNORMAL HIGH (ref 98–111)
Creatinine, Ser: 1.42 mg/dL — ABNORMAL HIGH (ref 0.44–1.00)
GFR, Estimated: 34 mL/min — ABNORMAL LOW (ref >60)
Glucose, Bld: 129 mg/dL — ABNORMAL HIGH (ref 70–99)
Potassium: 4.9 mmol/L (ref 3.5–5.1)
Sodium: 139 mmol/L (ref 135–145)

## 2022-01-13 LAB — RESP PANEL BY RT-PCR (FLU A&B, COVID) ARPGX2
Influenza A by PCR: NEGATIVE
Influenza B by PCR: NEGATIVE
SARS Coronavirus 2 by RT PCR: NEGATIVE

## 2022-01-13 LAB — MAGNESIUM: Magnesium: 1.8 mg/dL (ref 1.7–2.4)

## 2022-01-13 MED ORDER — SODIUM CHLORIDE 0.9 % IV SOLN
1.0000 g | Freq: Once | INTRAVENOUS | Status: AC
  Administered 2022-01-13: 1 g via INTRAVENOUS
  Filled 2022-01-13: qty 10

## 2022-01-13 MED ORDER — HYDRALAZINE HCL 20 MG/ML IJ SOLN
5.0000 mg | Freq: Once | INTRAMUSCULAR | Status: AC
  Administered 2022-01-13: 5 mg via INTRAVENOUS
  Filled 2022-01-13: qty 1

## 2022-01-13 MED ORDER — HYDRALAZINE HCL 20 MG/ML IJ SOLN
10.0000 mg | Freq: Once | INTRAMUSCULAR | Status: DC
Start: 2022-01-13 — End: 2022-01-13

## 2022-01-13 NOTE — ED Notes (Addendum)
Lab called to add Urine culture. Lab acknowledge and will add them.  ?

## 2022-01-13 NOTE — ED Triage Notes (Signed)
PT BIB [facility] from home w/ family d/t concerns for increased bilateral leg weakness onset today. Pt was unable to get herself out of bed. Pt's family verbalized concern for UTI, presents with similar s/s, and has had recurrent UTIs for the past year. EMS reports no neuro deficits. On scene pt was able to stand and pivot for EMS.  ?

## 2022-01-13 NOTE — ED Provider Notes (Signed)
?[facility]-EMERGENCY DEPT ?Provider Note ? ? ?CSN: [id number] ?Arrival date & time: 01/13/22  2127 ? ?  ? ?History ? ?Chief Complaint  ?Patient presents with  ? Weakness  ? ? ?MANUELITA MOXON is a 88 y.o. female. ? ?The history is provided by the patient, the EMS personnel, a relative and medical records. No language interpreter was used.  ?Weakness ? ?This is a 88 year old female with significant history of prior stroke, hypertension, recurrent UTI, CKD, CAD, ataxia brought here via EMS from home accompanied by family member with concerns of leg weakness.  Patient states she was at her normal state of health up into approximately 6 hours ago when she was trying to get up and felt very weak.  States both of her legs were weak and she was unable to support herself.  She normally use a walker to ambulate.  She does not complain of any headache, vision changes, confusion, nausea, vomiting, neck pain, chest pain, trouble breathing, abdominal pain, back pain.  She does endorse frequent urinary incontinence which is not unusual for her but denies any burning on urination.  No complaints of fever chills or cold symptoms.  Family member verbalized concern for UTI as patient has had recurrent UTI in the past with similar symptoms.  EMS did note that patient was able to stand and pivot for EMS prior to arrival.  She is not on any blood thinner medication. ? ?Home Medications ?Prior to Admission medications   ?Medication Sig Start Date End Date Taking? Authorizing Provider  ?Acetaminophen 325 MG CAPS Take 325 mg by mouth 3 (three) times daily as needed.    [provider]  ?amoxicillin (AMOXIL) 500 MG capsule SMARTSIG:4 Capsule(s) By Mouth 08/21/21   [provider]  ?aspirin 81 MG chewable tablet Chew 81 mg by mouth daily.    [provider]  ?atorvastatin (LIPITOR) 80 MG tablet Take 1 tablet (80 mg total) by mouth daily at 6 PM. 08/20/15   Ghimire, Werner Lean, MD  ?atorvastatin  (LIPITOR) 80 MG tablet Take 1 tablet by mouth daily.    [provider]  ?brimonidine (ALPHAGAN) 0.2 % ophthalmic solution brimonidine 0.2 % eye drops ? INSTILL 1 DROP INTO BOTH EYES TWICE A DAY    [provider]  ?brimonidine (ALPHAGAN) 0.2 % ophthalmic solution 1 drop into affected eye    [provider]  ?Calcium Carbonate-Vitamin D (CALTRATE 600+D PO) Take 1 tablet by mouth 2 (two) times daily.    [provider]  ?cephALEXin (KEFLEX) 250 MG capsule Take 250 mg by mouth at bedtime. 11/10/20   [provider]  ?chlorpheniramine (CHLOR-TRIMETON) 4 MG tablet Take 4 mg by mouth every 4 (four) hours as needed for allergies.    [provider]  ?denosumab (PROLIA) 60 MG/ML SOSY injection See admin instructions. 12/18/17   [provider]  ?diclofenac sodium (VOLTAREN) 1 % GEL diclofenac 1 % topical gel ? AS DIRECTED TWICE A DAY AS NEEDED TRANSDERMAL 14 DAYS    [provider]  ?dorzolamide-timolol (COSOPT) 22.3-6.8 MG/ML ophthalmic solution Place 1 drop into both eyes 2 (two) times daily.  ?Patient not taking: Reported on 11/12/2021    [provider]  ?famotidine (PEPCID) 20 MG tablet Take 20 mg by mouth daily.    [provider]  ?FLUZONE HIGH-DOSE QUADRIVALENT 0.7 ML SUSY  07/14/19   [provider]  ?furosemide (LASIX) 20 MG tablet Take 20 mg by mouth as needed. Every other day  as of 3/17 due to high K    [provider]  ?GEMTESA 75 MG TABS Take 1 tablet by mouth daily. 10/15/21   [provider]  ?gentamicin cream (GARAMYCIN) 0.1 % Apply 1 application topically 2 (two) times daily. 11/29/21   Felecia Shelling, DPM  ?latanoprost (XALATAN) 0.005 % ophthalmic solution latanoprost 0.005 % eye drops ? INSTILL 1 DROP INTO RIGHT EYE AT BEDTIME    [provider]  ?LOTEMAX SM 0.38 % GEL PLACE 1 DROP INTO THE RIGHT EYE 3 TIMES DAILY. 07/08/21   Rennis Chris, MD  ?Peter [place] SM 0.38 % GEL INSTILL 1 DROP SIX  TIMES A DAY IN RIGHT EYE. 11/07/21   Rennis Chris, MD  ?metoprolol (LOPRESSOR) 50 MG tablet Take 25 mg by mouth 2 (two) times daily.    [provider]  ?molnupiravir EUA (LAGEVRIO) 200 MG CAPS capsule Lagevrio 200 mg capsule (EUA) ? TAKE 4 CAPSULES BY MOUTH EVERY 12 HOURS FOR 5 DAYS    [provider]  ?Multiple Vitamins-Minerals (MULTIPLE VITAMINS/WOMENS PO) Take 1 tablet by mouth daily. W/vitamin D    [provider]  ?PFIZER-BIONT COVID-19 VAC-TRIS SUSP injection  05/08/21   [provider]  ?Polyethyl Glycol-Propyl Glycol (SYSTANE OP) Apply 1 drop to eye 2 (two) times daily.    [provider]  ?sodium chloride (MURO 128) 5 % ophthalmic ointment Place 1 application into the right eye at bedtime. 06/04/21   Rennis Chris, MD  ?sodium chloride (MURO 128) 5 % ophthalmic solution INSTILL 1 DROP INTO RIGHT EYE 4 TIMES A DAY 10/06/21   Rennis Chris, MD  ?timolol (BETIMOL) 0.25 % ophthalmic solution Place 1 drop into both eyes daily.    [provider]  ?   ? ?Allergies    ?Meloxicam, Nitrofurantoin, Other, Guaifenesin er, and Nitrofuran derivatives   ? ?Review of Systems   ?Review of Systems  ?Neurological:  Positive for weakness.  ?All other systems reviewed and are negative. ? ?Physical Exam ?Updated Vital Signs ?BP (!) 224/106   Pulse 88   Temp 97.8 ?F (36.6 ?C) (Oral)   Resp (!) 24   SpO2 100%  ?Physical Exam ?Vitals and nursing note reviewed.  ?Constitutional:   ?   General: She is not in acute distress. ?   Appearance: She is well-developed.  ?   Comments: Elderly female resting in bed appears to be in no acute discomfort  ?HENT:  ?   Head: Atraumatic.  ?Eyes:  ?   Extraocular Movements: Extraocular movements intact.  ?   Conjunctiva/sclera: Conjunctivae normal.  ?   Pupils: Pupils are equal, round, and reactive to light.  ?Cardiovascular:  ?   Rate and Rhythm: Normal rate and regular rhythm.  ?Pulmonary:  ?   Effort: Pulmonary effort is normal.   ?Abdominal:  ?   General: Bowel sounds are normal. There is distension.  ?   Tenderness: There is no abdominal tenderness.  ?Musculoskeletal:  ?   Cervical back: Neck supple.  ?   Comments: Weak but equal strength to all 4 extremities.  Patient does have some arthritis involving her right shoulder which limited her right shoulder range of motion compared to left.  ?Skin: ?   Findings: No rash.  ?Neurological:  ?   Mental Status: She is alert and oriented to person, place, and time.  ?   GCS: GCS eye subscore is 4. GCS verbal subscore is 5. GCS motor subscore is 6.  ?   Cranial  Nerves: Cranial nerves 2-12 are intact.  ?   Sensory: Sensation is intact.  ?   Motor: Motor function is intact.  ?Psychiatric:     ?   Mood and Affect: Mood normal.  ? ? ?ED Results / Procedures / Treatments   ?Labs ?(all labs ordered are listed, but only abnormal results are displayed) ?Labs Reviewed  ?BASIC METABOLIC PANEL - Abnormal; Notable for the following components:  ?    Result Value  ? Chloride 112 (*)   ? CO2 19 (*)   ? Glucose, Bld 129 (*)   ? BUN 49 (*)   ? Creatinine, Ser 1.42 (*)   ? GFR, Estimated 34 (*)   ? All other components within normal limits  ?CBC - Abnormal; Notable for the following components:  ? WBC 16.5 (*)   ? All other components within normal limits  ?URINALYSIS, ROUTINE W REFLEX MICROSCOPIC - Abnormal; Notable for the following components:  ? Color, Urine STRAW (*)   ? Hgb urine dipstick MODERATE (*)   ? Protein, ur 100 (*)   ? Leukocytes,Ua SMALL (*)   ? RBC / HPF >50 (*)   ? Bacteria, UA MANY (*)   ? All other components within normal limits  ?CBG MONITORING, ED - Abnormal; Notable for the following components:  ? Glucose-Capillary 129 (*)   ? All other components within normal limits  ?I-STAT CHEM 8, ED - Abnormal; Notable for the following components:  ? BUN 43 (*)   ? Creatinine, Ser 1.50 (*)   ? Glucose, Bld 129 (*)   ? Calcium, Ion 1.10 (*)   ? TCO2 20 (*)   ? All other components within normal limits   ?RESP PANEL BY RT-PCR (FLU A&B, COVID) ARPGX2  ?URINE CULTURE  ?MAGNESIUM  ?PHOSPHORUS  ? ? ?EKG ?EKG Interpretation ? ?Date/Time:  Thursday January 13 2022 22:21:35 EDT ?Ventricular Rate:  88 ?PR Interval:  130 ?QRS [place]

## 2022-01-13 NOTE — ED Notes (Signed)
Patient transported to CT 

## 2022-01-14 ENCOUNTER — Encounter (HOSPITAL_COMMUNITY): Payer: Self-pay | Admitting: Internal Medicine

## 2022-01-14 DIAGNOSIS — K219 Gastro-esophageal reflux disease without esophagitis: Secondary | ICD-10-CM

## 2022-01-14 DIAGNOSIS — I2583 Coronary atherosclerosis due to lipid rich plaque: Secondary | ICD-10-CM

## 2022-01-14 DIAGNOSIS — I5032 Chronic diastolic (congestive) heart failure: Secondary | ICD-10-CM

## 2022-01-14 DIAGNOSIS — H409 Unspecified glaucoma: Secondary | ICD-10-CM | POA: Diagnosis present

## 2022-01-14 DIAGNOSIS — E785 Hyperlipidemia, unspecified: Secondary | ICD-10-CM | POA: Diagnosis present

## 2022-01-14 DIAGNOSIS — Z20822 Contact with and (suspected) exposure to covid-19: Secondary | ICD-10-CM | POA: Diagnosis present

## 2022-01-14 DIAGNOSIS — R531 Weakness: Secondary | ICD-10-CM | POA: Diagnosis present

## 2022-01-14 DIAGNOSIS — N390 Urinary tract infection, site not specified: Secondary | ICD-10-CM | POA: Diagnosis present

## 2022-01-14 DIAGNOSIS — Z7982 Long term (current) use of aspirin: Secondary | ICD-10-CM | POA: Diagnosis not present

## 2022-01-14 DIAGNOSIS — Z8673 Personal history of transient ischemic attack (TIA), and cerebral infarction without residual deficits: Secondary | ICD-10-CM

## 2022-01-14 DIAGNOSIS — N3001 Acute cystitis with hematuria: Secondary | ICD-10-CM | POA: Diagnosis not present

## 2022-01-14 DIAGNOSIS — I130 Hypertensive heart and chronic kidney disease with heart failure and stage 1 through stage 4 chronic kidney disease, or unspecified chronic kidney disease: Secondary | ICD-10-CM | POA: Diagnosis present

## 2022-01-14 DIAGNOSIS — H3530 Unspecified macular degeneration: Secondary | ICD-10-CM | POA: Diagnosis present

## 2022-01-14 DIAGNOSIS — Z66 Do not resuscitate: Secondary | ICD-10-CM | POA: Diagnosis present

## 2022-01-14 DIAGNOSIS — M109 Gout, unspecified: Secondary | ICD-10-CM | POA: Diagnosis present

## 2022-01-14 DIAGNOSIS — E875 Hyperkalemia: Secondary | ICD-10-CM | POA: Diagnosis present

## 2022-01-14 DIAGNOSIS — B961 Klebsiella pneumoniae [K. pneumoniae] as the cause of diseases classified elsewhere: Secondary | ICD-10-CM | POA: Diagnosis present

## 2022-01-14 DIAGNOSIS — I2510 Atherosclerotic heart disease of native coronary artery without angina pectoris: Secondary | ICD-10-CM | POA: Diagnosis present

## 2022-01-14 DIAGNOSIS — Z90710 Acquired absence of both cervix and uterus: Secondary | ICD-10-CM | POA: Diagnosis not present

## 2022-01-14 DIAGNOSIS — Z888 Allergy status to other drugs, medicaments and biological substances status: Secondary | ICD-10-CM | POA: Diagnosis not present

## 2022-01-14 DIAGNOSIS — I252 Old myocardial infarction: Secondary | ICD-10-CM | POA: Diagnosis not present

## 2022-01-14 DIAGNOSIS — N1832 Chronic kidney disease, stage 3b: Secondary | ICD-10-CM | POA: Diagnosis present

## 2022-01-14 DIAGNOSIS — Z87440 Personal history of urinary (tract) infections: Secondary | ICD-10-CM | POA: Diagnosis not present

## 2022-01-14 DIAGNOSIS — Z79899 Other long term (current) drug therapy: Secondary | ICD-10-CM | POA: Diagnosis not present

## 2022-01-14 DIAGNOSIS — Z823 Family history of stroke: Secondary | ICD-10-CM | POA: Diagnosis not present

## 2022-01-14 DIAGNOSIS — N183 Chronic kidney disease, stage 3 unspecified: Secondary | ICD-10-CM | POA: Diagnosis not present

## 2022-01-14 DIAGNOSIS — N3090 Cystitis, unspecified without hematuria: Secondary | ICD-10-CM | POA: Diagnosis present

## 2022-01-14 DIAGNOSIS — E7800 Pure hypercholesterolemia, unspecified: Secondary | ICD-10-CM

## 2022-01-14 DIAGNOSIS — Z8249 Family history of ischemic heart disease and other diseases of the circulatory system: Secondary | ICD-10-CM | POA: Diagnosis not present

## 2022-01-14 DIAGNOSIS — I10 Essential (primary) hypertension: Secondary | ICD-10-CM

## 2022-01-14 LAB — CBC
HCT: 41.7 % (ref 36.0–46.0)
Hemoglobin: 13.2 g/dL (ref 12.0–15.0)
MCH: 28.7 pg (ref 26.0–34.0)
MCHC: 31.7 g/dL (ref 30.0–36.0)
MCV: 90.7 fL (ref 80.0–100.0)
Platelets: 310 10*3/uL (ref 150–400)
RBC: 4.6 MIL/uL (ref 3.87–5.11)
RDW: 15.4 % (ref 11.5–15.5)
WBC: 18.3 10*3/uL — ABNORMAL HIGH (ref 4.0–10.5)
nRBC: 0 % (ref 0.0–0.2)

## 2022-01-14 LAB — BASIC METABOLIC PANEL
Anion gap: 12 (ref 5–15)
BUN: 47 mg/dL — ABNORMAL HIGH (ref 8–23)
CO2: 19 mmol/L — ABNORMAL LOW (ref 22–32)
Calcium: 9 mg/dL (ref 8.9–10.3)
Chloride: 108 mmol/L (ref 98–111)
Creatinine, Ser: 1.44 mg/dL — ABNORMAL HIGH (ref 0.44–1.00)
GFR, Estimated: 33 mL/min — ABNORMAL LOW (ref >60)
Glucose, Bld: 140 mg/dL — ABNORMAL HIGH (ref 70–99)
Potassium: 4.6 mmol/L (ref 3.5–5.1)
Sodium: 139 mmol/L (ref 135–145)

## 2022-01-14 MED ORDER — POLYETHYLENE GLYCOL 3350 17 G PO PACK: 17.0000 g | PACK | Freq: Every day | ORAL | Status: DC | PRN

## 2022-01-14 MED ORDER — ATORVASTATIN CALCIUM 40 MG PO TABS
80.0000 mg | ORAL_TABLET | Freq: Every day | ORAL | Status: DC
  Administered 2022-01-14 – 2022-01-16 (×3): 80 mg via ORAL
  Filled 2022-01-14 (×3): qty 2

## 2022-01-14 MED ORDER — SODIUM CHLORIDE (HYPERTONIC) 5 % OP OINT
1.0000 "application " | TOPICAL_OINTMENT | Freq: Every day | OPHTHALMIC | Status: DC
  Administered 2022-01-14 – 2022-01-16 (×3): 1 via OPHTHALMIC
  Filled 2022-01-14: qty 3.5

## 2022-01-14 MED ORDER — VIBEGRON 75 MG PO TABS: 1.0000 | ORAL_TABLET | Freq: Every day | ORAL | Status: DC

## 2022-01-14 MED ORDER — SODIUM CHLORIDE 0.9 % IV SOLN
1.0000 g | INTRAVENOUS | Status: DC
  Administered 2022-01-14 – 2022-01-16 (×3): 1 g via INTRAVENOUS
  Filled 2022-01-14 (×3): qty 10

## 2022-01-14 MED ORDER — LABETALOL HCL 5 MG/ML IV SOLN
5.0000 mg | INTRAVENOUS | Status: DC | PRN
  Administered 2022-01-16 – 2022-01-17 (×3): 5 mg via INTRAVENOUS
  Filled 2022-01-14 (×3): qty 4

## 2022-01-14 MED ORDER — SODIUM CHLORIDE 0.9% FLUSH
3.0000 mL | Freq: Two times a day (BID) | INTRAVENOUS | Status: DC
  Administered 2022-01-14 – 2022-01-16 (×5): 3 mL via INTRAVENOUS

## 2022-01-14 MED ORDER — SODIUM CHLORIDE (HYPERTONIC) 5 % OP SOLN
1.0000 [drp] | Freq: Four times a day (QID) | OPHTHALMIC | Status: DC
  Administered 2022-01-14 – 2022-01-17 (×12): 1 [drp] via OPHTHALMIC
  Filled 2022-01-14: qty 15

## 2022-01-14 MED ORDER — TIMOLOL MALEATE 0.5 % OP SOLN
1.0000 [drp] | Freq: Every morning | OPHTHALMIC | Status: DC
  Administered 2022-01-14 – 2022-01-17 (×4): 1 [drp] via OPHTHALMIC
  Filled 2022-01-14: qty 5

## 2022-01-14 MED ORDER — ENOXAPARIN SODIUM 30 MG/0.3ML IJ SOSY
30.0000 mg | PREFILLED_SYRINGE | INTRAMUSCULAR | Status: DC
  Administered 2022-01-14 – 2022-01-17 (×4): 30 mg via SUBCUTANEOUS
  Filled 2022-01-14 (×4): qty 0.3

## 2022-01-14 MED ORDER — LOTEPREDNOL ETABONATE 0.38 % OP GEL
Freq: Every day | OPHTHALMIC | Status: DC
  Administered 2022-01-16: 1 [drp] via OPHTHALMIC

## 2022-01-14 MED ORDER — METOPROLOL TARTRATE 25 MG PO TABS
25.0000 mg | ORAL_TABLET | Freq: Two times a day (BID) | ORAL | Status: DC
  Administered 2022-01-14 – 2022-01-17 (×7): 25 mg via ORAL
  Filled 2022-01-14 (×7): qty 1

## 2022-01-14 MED ORDER — FAMOTIDINE 20 MG PO TABS
20.0000 mg | ORAL_TABLET | Freq: Every day | ORAL | Status: DC
  Administered 2022-01-14 – 2022-01-17 (×4): 20 mg via ORAL
  Filled 2022-01-14 (×4): qty 1

## 2022-01-14 MED ORDER — BRIMONIDINE TARTRATE 0.2 % OP SOLN
1.0000 [drp] | Freq: Two times a day (BID) | OPHTHALMIC | Status: DC
  Administered 2022-01-14 – 2022-01-17 (×5): 1 [drp] via OPHTHALMIC
  Filled 2022-01-14: qty 5

## 2022-01-14 MED ORDER — ACETAMINOPHEN 650 MG RE SUPP: 650.0000 mg | Freq: Four times a day (QID) | RECTAL | Status: DC | PRN

## 2022-01-14 MED ORDER — ACETAMINOPHEN 325 MG PO TABS
650.0000 mg | ORAL_TABLET | Freq: Four times a day (QID) | ORAL | Status: DC | PRN
  Administered 2022-01-15 – 2022-01-17 (×2): 650 mg via ORAL
  Filled 2022-01-14 (×2): qty 2

## 2022-01-14 MED ORDER — ASPIRIN 81 MG PO CHEW
81.0000 mg | CHEWABLE_TABLET | Freq: Every day | ORAL | Status: DC
  Administered 2022-01-14 – 2022-01-17 (×4): 81 mg via ORAL
  Filled 2022-01-14 (×4): qty 1

## 2022-01-14 MED ORDER — VIBEGRON 75 MG PO TABS
75.0000 mg | ORAL_TABLET | Freq: Every day | ORAL | Status: DC
  Administered 2022-01-15 – 2022-01-17 (×3): 75 mg via ORAL

## 2022-01-14 NOTE — Progress Notes (Signed)
Patient seen and examined personally, I reviewed the chart, history and physical and admission note, done by admitting physician this morning and agree with the same with following addendum.  Please refer to the morning admission note for more detailed plan of care. ? ?Briefly,  ?Seen and examined this morning.  She reports she feels some better.  But she has not mobilized or gotten up yet. ? ?88 y.o. female with medical history significant of stroke, hypertension, recurrent UTI, CKD 3, CAD, HLD,diastolic CHF, GERD, gout, macular degeneration, glaucoma presenting with weakness , increased urinary frequency and incontinence. While trying to stand family noticed patient to be weak , felt very weak in both legs,fell,was unable to support herself. Typically she is able to walk at baseline with assistance of a walker.  Patient had similar presentation from UTI in the past. ?In the ED work-up showed creatinine 1.4 leukocytosis UA with WBC 0-5 RBC more than 50 LE small. ?CT head without acute abnormality.  CT renal stone study showing findings consistent with cystitis.  Patient received hydralazine and ceftriaxone in the ED and was admitted for further management  ? ? ?Issues: ?UTI ?History of previous UTI ?Generalized weakness/deconditioning in the setting of UTI: ?Continue ceftriaxone, follow-up urine culture, PT OT evaluation for deconditioning ? ?CKD stage IIIb baseline creatinine close to 1.4 ? ?Chronic diastolic CHF, G1 DD EF 65 to 70% in 2016. ? ?CAD ?Hypertension  ?HLD: ?with poorly controlled blood pressure in the ED responded to hydralazine IV, on home metoprolol.  On aspirin, Lipitor. ? ?History of CVA-on aspirin Lipitor. ? ?Macular degeneration ?Glaucoma ?

## 2022-01-14 NOTE — TOC Initial Note (Signed)
Transition of Care (TOC) - Initial/Assessment Note  ? ? ?Patient Details  ?Name: [name] ?MRN: [id number] ?Date of Birth: [birth year] ? ?Transition of Care (TOC) CM/SW Contact:    ?Trish Mage, LCSW ?Phone Number: ?01/14/2022, 4:31 PM ? ?Clinical Narrative:   Patient seen in follow up to PT/OT recommendation of [facility] with 24/7 supervision v SNF.  Spoke to daughter [name] who was bedside.  She explains that prior to admission, [name] was living at home alone, and two daughters were splitting weeks staying overnight, but both work during the day and are unable to assist. Paula Enriques was able to do transfers herself as well as ADLs.  Currently, there is no one to provide 24/7.  [name] is hopeful she will progress during hospitalization, but is also realistic enough to know that she may need to go to short term rehab.  Bed search process explained and initiated. TOC will continue to follow during the course of hospitalization. ?             ? ? ?Expected Discharge Plan: [place] ?Barriers to Discharge: SNF Pending bed offer ? ? ?Patient Goals and CMS Choice ?Patient states their goals for this hospitalization and ongoing recovery are:: get stronger ?CMS Medicare.gov Compare Post Acute Care list provided to:: Patient ?Choice offered to / list presented to : Patient, Adult Children ? ?Expected Discharge Plan and Services ?Expected Discharge Plan: [place] ?  ?Discharge Planning Services: CM Consult ?Post Acute Care Choice: [place] ?Living arrangements for the past 2 months: [place] ?                ?  ?  ?  ?  ?  ?  ?  ?  ?  ?  ? ?Prior Living Arrangements/Services ?Living arrangements for the past 2 months: [place] ?Lives with:: Self ?Patient language and need for interpreter reviewed:: Yes ?       ?Need for Family Participation in Patient Care: Yes (Comment) ?Care giver support system in place?: Yes (comment) ?Current home services: DME ?Criminal  Activity/Legal Involvement Pertinent to Current Situation/Hospitalization: No - Comment as needed ? ?Activities of Daily Living ?Home Assistive Devices/Equipment: Cane (specify quad or straight) ?ADL Screening (condition at time of admission) ?Patient's cognitive ability adequate to safely complete daily activities?: Yes ?Is the patient deaf or have difficulty hearing?: Yes ?Does the patient have difficulty seeing, even when wearing glasses/contacts?: Yes ?Does the patient have difficulty concentrating, remembering, or making decisions?: No ?Patient able to express need for assistance with ADLs?: No ?Does the patient have difficulty dressing or bathing?: No ?Independently performs ADLs?: Yes (appropriate for developmental age) ?Does the patient have difficulty walking or climbing stairs?: Yes ?Weakness of Legs: Both ?Weakness of Arms/Hands: None ? ?Permission Sought/Granted ?Permission sought to share information with : Family Supports ?Permission granted to share information with : Yes, Verbal Permission Granted ? Share Information with NAME: [name] (Daughter)   [phone] ?   ?   ?   ? ?Emotional Assessment ?Appearance:: Appears stated age ?Attitude/Demeanor/Rapport: Unable to Assess ?Affect (typically observed): Unable to Assess ?Orientation: : Oriented to Self, Oriented to Place, Oriented to Situation ?Alcohol / Substance Use: Not Applicable ?Psych Involvement: No (comment) ? ?Admission diagnosis:  Lower urinary tract infectious disease [N39.0] ?UTI (urinary tract infection) [N39.0] ?Hypertensive urgency [I16.0] ?Generalized weakness [R53.1] ?Cystitis [N30.90] ?Patient Active Problem List  ? Diagnosis Date Noted  ? UTI (urinary tract infection) 01/14/2022  ?  Glaucoma 01/14/2022  ? Macular degeneration 01/14/2022  ? Cystitis 01/14/2022  ? 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 Chronic diastolic heart failure ([place]) 10/07/2020  ? Cough 10/07/2020  ? Diabetic renal disease ([place]) 10/07/2020  ?  Encounter for general adult medical examination without abnormal findings 10/07/2020  ? Gastroesophageal reflux disease 10/07/2020  ? Generalized osteoarthritis 10/07/2020  ? Gout 10/07/2020  ? 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 Iron deficiency anemia 10/07/2020  ? Knee pain 10/07/2020  ? Osteoporosis 10/07/2020  ? Primary pulmonary hypertension ([place]) 10/07/2020  ? Recurrent falls 10/07/2020  ? Tricuspid valve disorder 10/07/2020  ? Vitamin B12 deficiency (non anemic) 10/07/2020  ? Vitamin D deficiency 10/07/2020  ? Bilateral elbow joint pain 03/17/2020  ? 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 Pain of left hip joint 06/12/2019  ? Pain in joint of left shoulder 05/03/2018  ? Osteoarthritis of knee 12/28/2017  ? Upper airway cough syndrome 07/24/2017  ? Coronary artery disease 11/12/2015  ? Hyperlipidemia 11/12/2015  ? TIA (transient ischemic attack)   ? NSTEMI (non-ST elevated myocardial infarction) ([place]) 08/18/2015  ? History of CVA (cerebrovascular accident) 09/26/2012  ? 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 UTI (lower urinary tract infection) 09/26/2012  ? Hyperkalemia 09/26/2012  ? CKD (chronic kidney disease), stage III ([place]) 09/26/2012  ? Essential hypertension   ? ?PCP:  [name], DO ?Pharmacy:   ?[id number] [place] [place] ?[address] ?[place] [address] ?Phone: [phone] Fax: [phone] ? ? ? ? ?Social Determinants of Health (SDOH) Interventions ?  ? ?Readmission Risk Interventions ?   ? View : No data to display.  ?  ?  ?  ? ? ? ?

## 2022-01-14 NOTE — Hospital Course (Addendum)
88 y.o. female with medical history significant of stroke, hypertension, recurrent UTI, CKD 3, CAD, HLD,diastolic CHF, GERD, gout, macular degeneration, glaucoma presenting with weakness , increased urinary frequency and incontinence. While trying to stand family noticed patient to be weak , felt very weak in both legs,fell,was unable to support herself. Typically she is able to walk at baseline with assistance of a walker.  Patient had similar presentation from UTI in the past. ?In the ED work-up showed creatinine 1.4 leukocytosis UA with WBC 0-5 RBC more than 50 LE small. ?CT head without acute abnormality.  CT renal stone study showing findings consistent with cystitis.  Patient received hydralazine and ceftriaxone in the ED and was admitted for further management ?

## 2022-01-14 NOTE — ED Notes (Signed)
ED TO INPATIENT HANDOFF REPORT ? ?ED Nurse Name and Phone #: Mel Almond RN  ? ?S ?Name/Age/Gender ?[name] ?88 y.o. ?female ?Room/Bed: WA11/WA11 ? ?Code Status ?  Code Status: DNR ? ?Home/SNF/Other ?Home ?Patient oriented to: self, place, time, and situation ?Is this baseline? Yes  ? ?Triage Complete: Triage complete  ?Chief Complaint ?UTI (urinary tract infection) [N39.0] ? ?Triage Note ?PT BIB [facility] from home w/ family d/t concerns for increased bilateral leg weakness onset today. Pt was unable to get herself out of bed. Pt's family verbalized concern for UTI, presents with similar s/s, and has had recurrent UTIs for the past year. EMS reports no neuro deficits. On scene pt was able to stand and pivot for EMS.   ? ?Allergies ?Allergies  ?Allergen Reactions  ? Meloxicam Hives  ? Nitrofurantoin   ? Other Other (See Comments)  ? Guaifenesin Er Rash  ?  Other reaction(s): rash  ? Nitrofuran Derivatives Rash  ? ? ?Level of Care/Admitting Diagnosis ?ED Disposition   ? ? ED Disposition  ?Admit  ? Condition  ?--  ? Comment  ?Hospital Area: [facility] [id number] ? Level of Care: Telemetry [5] ? Admit to tele based on following criteria: Other see comments ? Comments: UTI, Weakness. close monitoring, Hypetensive Urgency ? May place patient in observation at [facility] or [facility] if equivalent level of care is available:: No ? Covid Evaluation: Asymptomatic - no recent exposure (last 10 days) testing not required ? Diagnosis: UTI (urinary tract infection) [id number] ? Admitting Physician: Marcelyn Bruins [id number] ? Attending Physician: Marcelyn Bruins [id number] ?  ?  ? ?  ? ? ?B ?Medical/Surgery History ?Past Medical History:  ?Diagnosis Date  ? Anemia   ? Arthritis   ? 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 Fall at home 09/26/2012  ? Glaucoma   ? OU  ? Gout, joint   ? Hypertension   ? Hypertensive retinopathy   ? OU  ? Macular degeneration   ?  OU  ? NSTEMI (non-ST elevated myocardial infarction) ([place]) 08/2015  ? Stroke [facility])   ? ?Past Surgical History:  ?Procedure Laterality Date  ? ABDOMINAL HYSTERECTOMY    ? BREAST SURGERY    ? CYST  ? CARDIAC CATHETERIZATION N/A 08/18/2015  ? Procedure: Left Heart Cath and Coronary Angiography;  Surgeon: Troy Sine, MD;  Location: [place] [facility];  Service: Cardiovascular;  Laterality: N/A;  ? CARDIAC CATHETERIZATION  08/18/2015  ? Procedure: Coronary Stent Intervention;  Surgeon: Troy Sine, MD;  Location: [place] [facility];  Service: Cardiovascular;;  ? CATARACT EXTRACTION Bilateral   ? DILATION AND CURETTAGE OF UTERUS    ? EYE SURGERY Bilateral   ? HERNIA REPAIR    ? IRIDOTOMY / IRIDECTOMY Right   ? JOINT REPLACEMENT  left knee  ? REFRACTIVE SURGERY Right 2017  ?  ? ?A ?IV Location/Drains/Wounds ?Patient Lines/Drains/Airways Status   ? ? Active Line/Drains/Airways   ? ? Name Placement date Placement time Site Days  ? Peripheral IV 01/13/22 20 G 1" Right Antecubital 01/13/22  2336  Antecubital  1  ? ?  ?  ? ?  ? ? ?Intake/Output Last 24 hours ?No intake or output data in the 24 hours ending 01/14/22 0431 ? ?Labs/Imaging ?Results for orders placed or performed during the hospital encounter of 01/13/22 (from the past 48 hour(s))  ?Resp Panel by RT-PCR (Flu A&B, Covid) Nasopharyngeal  Swab     Status: None  ? Collection Time: 01/13/22 10:36 PM  ? Specimen: Nasopharyngeal Swab; Nasopharyngeal(NP) swabs in vial transport medium  ?Result Value Ref Range  ? SARS Coronavirus 2 by RT PCR NEGATIVE NEGATIVE  ?  Comment: (NOTE) ?SARS-CoV-2 target nucleic acids are NOT DETECTED. ? ?The SARS-CoV-2 RNA is generally detectable in upper respiratory ?specimens during the acute phase of infection. The lowest ?concentration of SARS-CoV-2 viral copies this assay can detect is ?138 copies/mL. A negative result does not preclude SARS-Cov-2 ?infection and should not be used as the sole basis for treatment or ?other patient  management decisions. A negative result may occur with  ?improper specimen collection/handling, submission of specimen other ?than nasopharyngeal swab, presence of viral mutation(s) within the ?areas targeted by this assay, and inadequate number of viral ?copies(<138 copies/mL). A negative result must be combined with ?clinical observations, patient history, and epidemiological ?information. The expected result is Negative. ? ?Fact Sheet for Patients:  ?EntrepreneurPulse.com.au ? ?Fact Sheet for Healthcare Providers:  ?IncredibleEmployment.be ? ?This test is no t yet approved or cleared by the [place] FDA and  ?has been authorized for detection and/or diagnosis of SARS-CoV-2 by ?FDA under an Emergency Use Authorization (EUA). This EUA will remain  ?in effect (meaning this test can be used) for the duration of the ?COVID-19 declaration under Section 564(b)(1) of the Act, 21 ?U.S.C.section 360bbb-3(b)(1), unless the authorization is terminated  ?or revoked sooner.  ? ? ?  ? Influenza A by PCR NEGATIVE NEGATIVE  ? Influenza B by PCR NEGATIVE NEGATIVE  ?  Comment: (NOTE) ?The Xpert Xpress SARS-CoV-2/FLU/RSV plus assay is intended as an aid ?in the diagnosis of influenza from Nasopharyngeal swab specimens and ?should not be used as a sole basis for treatment. Nasal washings and ?aspirates are unacceptable for Xpert Xpress SARS-CoV-2/FLU/RSV ?testing. ? ?Fact Sheet for Patients: ?EntrepreneurPulse.com.au ? ?Fact Sheet for Healthcare Providers: ?IncredibleEmployment.be ? ?This test is not yet approved or cleared by the [place] FDA and ?has been authorized for detection and/or diagnosis of SARS-CoV-2 by ?FDA under an Emergency Use Authorization (EUA). This EUA will remain ?in effect (meaning this test can be used) for the duration of the ?COVID-19 declaration under Section 564(b)(1) of the Act, 21 U.S.C. ?section 360bbb-3(b)(1), unless the  authorization is terminated or ?revoked. ? ?Performed at [facility], [place] [address] ?[place], [place] [address] ?  ?Basic metabolic panel     Status: Abnormal  ? Collection Time: 01/13/22 10:37 PM  ?Result Value Ref Range  ? Sodium 139 135 - 145 mmol/L  ? Potassium 4.9 3.5 - 5.1 mmol/L  ? Chloride 112 (H) 98 - 111 mmol/L  ? CO2 19 (L) 22 - 32 mmol/L  ? Glucose, Bld 129 (H) 70 - 99 mg/dL  ?  Comment: Glucose reference range applies only to samples taken after fasting for at least 8 hours.  ? BUN 49 (H) 8 - 23 mg/dL  ? Creatinine, Ser 1.42 (H) 0.44 - 1.00 mg/dL  ? Calcium 9.1 8.9 - 10.3 mg/dL  ? GFR, Estimated 34 (L) >60 mL/min  ?  Comment: (NOTE) ?Calculated using the CKD-EPI Creatinine Equation (2021) ?  ? Anion gap 8 5 - 15  ?  Comment: Performed at [facility], [place] [address] [place],  [address]  ?CBC     Status: Abnormal  ? Collection Time: 01/13/22 10:37 PM  ?Result Value Ref Range  ? WBC 16.5 (H) 4.0 - 10.5 K/uL  ?  RBC 4.48 3.87 - 5.11 MIL/uL  ? Hemoglobin 12.7 12.0 - 15.0 g/dL  ? HCT 40.9 36.0 - 46.0 %  ? MCV 91.3 80.0 - 100.0 fL  ? MCH 28.3 26.0 - 34.0 pg  ? MCHC 31.1 30.0 - 36.0 g/dL  ? RDW 15.3 11.5 - 15.5 %  ? Platelets 322 150 - 400 K/uL  ? nRBC 0.0 0.0 - 0.2 %  ?  Comment: Performed at [facility], [place] [address] [place], [place] [address]  ?Magnesium     Status: None  ? Collection Time: 01/13/22 10:37 PM  ?Result Value Ref Range  ? Magnesium 1.8 1.7 - 2.4 mg/dL  ?  Comment: Performed at [facility], [place] [address], [place], [place] [address]  ?Phosphorus     Status: None  ? Collection Time: 01/13/22 10:37 PM  ?Result Value Ref Range  ? Phosphorus 3.8 2.5 - 4.6 mg/dL  ?  Comment: Performed at [facility], [place] [address], [place], [place] [address]  ?I-stat chem 8, ED (not at [facility] or [facility])     Status: Abnormal  ? Collection Time: 01/13/22 10:43 PM  ?Result Value Ref Range  ? Sodium 138 135 - 145  mmol/L  ? Potassium 4.8 3.5 - 5.1 mmol/L  ? Chloride 110 98 - 111 mmol/L  ? BUN 43 (H) 8 - 23 mg/dL  ? Creatinine, Ser 1.50 (H) 0.44 - 1.00 mg/dL  ? Glucose, Bld 129 (H) 70 - 99 mg/dL  ?  Comment: Glucose referenc

## 2022-01-14 NOTE — Plan of Care (Signed)
  Problem: Education: Goal: Knowledge of General Education information will improve Description: Including pain rating scale, medication(s)/side effects and non-pharmacologic comfort measures Outcome: Progressing   Problem: Health Behavior/Discharge Planning: Goal: Ability to manage health-related needs will improve Outcome: Progressing   Problem: Clinical Measurements: Goal: Ability to maintain clinical measurements within normal limits will improve Outcome: Progressing Goal: Will remain free from infection Outcome: Progressing Goal: Diagnostic test results will improve Outcome: Progressing Goal: Respiratory complications will improve Outcome: Progressing Goal: Cardiovascular complication will be avoided Outcome: Progressing   Problem: Activity: Goal: Risk for activity intolerance will decrease Outcome: Progressing   Problem: Nutrition: Goal: Adequate nutrition will be maintained Outcome: Progressing   Problem: Coping: Goal: Level of anxiety will decrease Outcome: Progressing   Problem: Elimination: Goal: Will not experience complications related to bowel motility Outcome: Progressing Goal: Will not experience complications related to urinary retention Outcome: Progressing   Problem: Safety: Goal: Ability to remain free from injury will improve Outcome: Progressing   Problem: Skin Integrity: Goal: Risk for impaired skin integrity will decrease Outcome: Progressing   Problem: Urinary Elimination: Goal: Signs and symptoms of infection will decrease Outcome: Progressing   

## 2022-01-14 NOTE — H&P (Signed)
?History and Physical  ? ?[name] [id number] DOB: [birth year] DOA: 01/13/2022 ? ?PCP: [name], DO  ? ?Patient coming from: Home ? ?Chief Complaint: Weakness ? ?HPI: [name] is a 88 y.o. female with medical history significant of stroke, hypertension, recurrent UTI, CKD 3, CAD, hyperlipidemia, diastolic CHF, GERD, gout, macular degeneration, glaucoma presenting with weakness. ? ?Patient was normal appearing until 6 hours prior to presentation.  She was noted by family to be trying to standing and with very weak appearing and felt very weak in both legs.  She fell unable to support herself.  Typically she is able to walk at baseline with assistance of a walker.  Per reports she was able to stand and turn for EMS prior to transport.  She does report increased urinary frequency and incontinence though she has numbness at baseline.  Denies dysuria.  Family concern for UTI due to history of UTIs with similar presentation. ? ?Patient denies fevers, chills, chest pain, shortness of breath, abdominal pain, constipation, diarrhea, nausea, vomiting. ? ?ED Course: Vital signs in the ED significant for respiratory rate in the teens to 20s and blood pressure in the 190s to 220s.  Lab work-up included BMP with chloride 112, bicarb 19, BUN 49, creatinine stable 1.42, glucose 129.  CBC with leukocytosis to 16.5.  Magnesium and phosphorus normal.  Urinalysis with hemoglobin, leukocytes, bacteria.  Urine culture pending.  CT head without acute abnormality.  CT renal stone study showing findings consistent with cystitis.  Patient received hydralazine and ceftriaxone in the ED. ? ?Review of Systems: As per HPI otherwise all other systems reviewed and are negative. ? ?Past Medical History:  ?Diagnosis Date  ? Anemia   ? Arthritis   ? 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 Fall at home 09/26/2012  ? Glaucoma   ? OU  ? Gout, joint   ? Hypertension   ? Hypertensive  retinopathy   ? OU  ? Macular degeneration   ? OU  ? NSTEMI (non-ST elevated myocardial infarction) ([place]) 08/2015  ? Stroke [facility])   ? ? ?Past Surgical History:  ?Procedure Laterality Date  ? ABDOMINAL HYSTERECTOMY    ? BREAST SURGERY    ? CYST  ? CARDIAC CATHETERIZATION N/A 08/18/2015  ? Procedure: Left Heart Cath and Coronary Angiography;  Surgeon: Troy Sine, MD;  Location: [place] [facility];  Service: Cardiovascular;  Laterality: N/A;  ? CARDIAC CATHETERIZATION  08/18/2015  ? Procedure: Coronary Stent Intervention;  Surgeon: Troy Sine, MD;  Location: [place] [facility];  Service: Cardiovascular;;  ? CATARACT EXTRACTION Bilateral   ? DILATION AND CURETTAGE OF UTERUS    ? EYE SURGERY Bilateral   ? HERNIA REPAIR    ? IRIDOTOMY / IRIDECTOMY Right   ? JOINT REPLACEMENT  left knee  ? REFRACTIVE SURGERY Right 2017  ? ? ?Social History ? reports that she has never smoked. She has never used smokeless tobacco. She reports that she does not drink alcohol and does not use drugs. ? ?Allergies  ?Allergen Reactions  ? Meloxicam Hives  ? Nitrofurantoin   ? Other Other (See Comments)  ? Guaifenesin Er Rash  ?  Other reaction(s): rash  ? Nitrofuran Derivatives Rash  ? ? ?Family History  ?Problem Relation Age of Onset  ? Heart disease Mother   ? Stroke Mother   ?Reviewed on admission ? ?Prior to Admission medications   ?Medication Sig Start Date End Date  Taking? Authorizing Provider  ?Acetaminophen 325 MG CAPS Take 325 mg by mouth 3 (three) times daily as needed.    [provider]  ?amoxicillin (AMOXIL) 500 MG capsule SMARTSIG:4 Capsule(s) By Mouth 08/21/21   [provider]  ?aspirin 81 MG chewable tablet Chew 81 mg by mouth daily.    [provider]  ?atorvastatin (LIPITOR) 80 MG tablet Take 1 tablet (80 mg total) by mouth daily at 6 PM. 08/20/15   Ghimire, Henreitta Leber, MD  ?atorvastatin (LIPITOR) 80 MG tablet Take 1 tablet by mouth daily.    [provider]  ?brimonidine (ALPHAGAN) 0.2  % ophthalmic solution brimonidine 0.2 % eye drops ? INSTILL 1 DROP INTO BOTH EYES TWICE A DAY    [provider]  ?brimonidine (ALPHAGAN) 0.2 % ophthalmic solution 1 drop into affected eye    [provider]  ?Calcium Carbonate-Vitamin D (CALTRATE 600+D PO) Take 1 tablet by mouth 2 (two) times daily.    [provider]  ?cephALEXin (KEFLEX) 250 MG capsule Take 250 mg by mouth at bedtime. 11/10/20   [provider]  ?chlorpheniramine (CHLOR-TRIMETON) 4 MG tablet Take 4 mg by mouth every 4 (four) hours as needed for allergies.    [provider]  ?denosumab (PROLIA) 60 MG/ML SOSY injection See admin instructions. 12/18/17   [provider]  ?diclofenac sodium (VOLTAREN) 1 % GEL diclofenac 1 % topical gel ? AS DIRECTED TWICE A DAY AS NEEDED TRANSDERMAL 14 DAYS    [provider]  ?dorzolamide-timolol (COSOPT) 22.3-6.8 MG/ML ophthalmic solution Place 1 drop into both eyes 2 (two) times daily.  ?Patient not taking: Reported on 11/12/2021    [provider]  ?famotidine (PEPCID) 20 MG tablet Take 20 mg by mouth daily.    [provider]  ?FLUZONE HIGH-DOSE QUADRIVALENT 0.7 ML SUSY  07/14/19   [provider]  ?furosemide (LASIX) 20 MG tablet Take 20 mg by mouth as needed. Every other day as of 3/17 due to high K    [provider]  ?GEMTESA 75 MG TABS Take 1 tablet by mouth daily. 10/15/21   [provider]  ?gentamicin cream (GARAMYCIN) 0.1 % Apply 1 application topically 2 (two) times daily. 11/29/21   Edrick Kins, DPM  ?latanoprost (XALATAN) 0.005 % ophthalmic solution latanoprost 0.005 % eye drops ? INSTILL 1 DROP INTO RIGHT EYE AT BEDTIME    [provider]  ?LOTEMAX SM 0.38 % GEL PLACE 1 DROP INTO THE RIGHT EYE 3 TIMES DAILY. 07/08/21   Bernarda Caffey, MD  ?Amador Cunas SM 0.38 % GEL INSTILL 1 DROP SIX TIMES A DAY IN RIGHT EYE. 11/07/21   Bernarda Caffey, MD  ?metoprolol (LOPRESSOR) 50 MG tablet Take 25 mg by mouth 2  (two) times daily.    [provider]  ?molnupiravir EUA (LAGEVRIO) 200 MG CAPS capsule Lagevrio 200 mg capsule (EUA) ? TAKE 4 CAPSULES BY MOUTH EVERY 12 HOURS FOR 5 DAYS    [provider]  ?Multiple Vitamins-Minerals (MULTIPLE VITAMINS/WOMENS PO) Take 1 tablet by mouth daily. W/vitamin D    [provider]  ?PFIZER-BIONT COVID-19 VAC-TRIS SUSP injection  05/08/21   [provider]  ?Polyethyl Glycol-Propyl Glycol (SYSTANE OP) Apply 1 drop to eye 2 (two) times daily.    [provider]  ?sodium chloride (MURO 128) 5 % ophthalmic ointment Place 1 application into the right eye at bedtime. 06/04/21   Bernarda Caffey, MD  ?sodium chloride (MURO 128) 5 % ophthalmic solution  INSTILL 1 DROP INTO RIGHT EYE 4 TIMES A DAY 10/06/21   Bernarda Caffey, MD  ?timolol (BETIMOL) 0.25 % ophthalmic solution Place 1 drop into both eyes daily.    [provider]  ? ? ?Physical Exam: ?Vitals:  ? 01/13/22 2322 01/13/22 2330 01/13/22 2351 01/14/22 0045  ?BP:  (!) 219/116 (!) 193/89 (!) 191/87  ?Pulse:  91 91 92  ?Resp:  (!) 26 (!) 22 18  ?Temp:      ?TempSrc:      ?SpO2:  97% 99% 98%  ?Weight: 61.7 kg     ?Height: 5' (1.524 m)     ? ? ?Physical Exam ?Constitutional:   ?   General: She is not in acute distress. ?   Appearance: Normal appearance.  ?HENT:  ?   Head: Normocephalic and atraumatic.  ?   Mouth/Throat:  ?   Mouth: Mucous membranes are moist.  ?   Pharynx: Oropharynx is clear.  ?Eyes:  ?   Extraocular Movements: Extraocular movements intact.  ?   Pupils: Pupils are equal, round, and reactive to light.  ?Cardiovascular:  ?   Rate and Rhythm: Normal rate and regular rhythm.  ?   Pulses: Normal pulses.  ?   Heart sounds: Murmur heard.  ?Pulmonary:  ?   Effort: Pulmonary effort is normal. No respiratory distress.  ?   Breath sounds: Normal breath sounds.  ?Abdominal:  ?   General: Bowel sounds are normal. There is no distension.  ?   Palpations: Abdomen is soft.  ?   Tenderness: There  is no abdominal tenderness.  ?Musculoskeletal:     ?   General: No swelling or deformity.  ?   Right lower leg: Edema present.  ?   Left lower leg: Edema present.  ?Skin: ?   General: Skin is warm and dry.  ?

## 2022-01-14 NOTE — Evaluation (Signed)
Physical Therapy Evaluation ?Patient Details ?Name: [name] ?MRN: [id number] ?DOB: [birth year] ?Today's Date: 01/14/2022 ? ?History of Present Illness ? 88 y.o. female with medical history significant of stroke, hypertension, recurrent UTI, CKD 3, CAD, HLD,diastolic CHF, GERD, gout, macular degeneration, glaucoma presenting with weakness , increased urinary frequency and incontinence. While trying to stand family noticed patient to be weak , felt very weak in both legs,fell,was unable to support herself. Dx of UTI.  ?Clinical Impression ? Pt admitted with above diagnosis. Pt ambulated 8' with RW with min assist, distance limited by fatigue. At baseline, pt is independent with mobility and ADLs. She has 2 daughters that assist her prn. She has had 3-4 falls in past 6 months. She may need close to 24/7 assist initially upon DC from acute stay, depending on progress. Pt currently with functional limitations due to the deficits listed below (see PT Problem List). Pt will benefit from skilled PT to increase their independence and safety with mobility to allow discharge to the venue listed below.   ? ?   ?   ? ?Recommendations for follow up therapy are one component of a multi-disciplinary discharge planning process, led by the attending physician.  Recommendations may be updated based on patient status, additional functional criteria and insurance authorization. ? ?Follow Up Recommendations Home health PT ? ?  ?Assistance Recommended at Discharge Frequent or constant Supervision/Assistance  ?Patient can return home with the following ? A little help with walking and/or transfers;A little help with bathing/dressing/bathroom;Assistance with cooking/housework;Assist for transportation;Help with stairs or ramp for entrance ? ?  ?Equipment Recommendations None recommended by PT  ?Recommendations for Other Services ?    ?  ?Functional Status Assessment Patient has had a recent decline in their functional status and  demonstrates the ability to make significant improvements in function in a reasonable and predictable amount of time.  ? ?  ?Precautions / Restrictions Precautions ?Precautions: Fall ?Precaution Comments: 3-4 falls in past 6 months ?Restrictions ?Weight Bearing Restrictions: No  ? ?  ? ?Mobility ? Bed Mobility ?Overal bed mobility: Needs Assistance ?Bed Mobility: Supine to Sit ?  ?  ?Supine to sit: Mod assist ?  ?  ?General bed mobility comments: assist to raise trunk and pivot hips ?  ? ?Transfers ?Overall transfer level: Needs assistance ?Equipment used: Rolling walker (2 wheels) ?Transfers: Sit to/from Stand, Bed to chair/wheelchair/BSC ?Sit to Stand: Mod assist ?Stand pivot transfers: Min assist ?  ?  ?  ?  ?General transfer comment: assist to power up, mild loss of balance posteriorly, min A to take pivotal steps to recliner ?  ? ?Ambulation/Gait ?Ambulation/Gait assistance: Min assist ?Gait Distance (Feet): 8 Feet ?Assistive device: Rolling walker (2 wheels) ?Gait Pattern/deviations: Step-through pattern, Decreased stride length ?Gait velocity: decr ?  ?  ?General Gait Details: distance limited by fatigue, SaO2 97% on room air with activity ? ?Stairs ?  ?  ?  ?  ?  ? ?Wheelchair Mobility ?  ? ?Modified Rankin (Stroke Patients Only) ?  ? ?  ? ?Balance Overall balance assessment: Needs assistance ?Sitting-balance support: Feet supported, No upper extremity supported ?Sitting balance-Leahy Scale: Good ?  ?  ?Standing balance support: Bilateral upper extremity supported ?Standing balance-Leahy Scale: Poor ?Standing balance comment: relies on BUE support ?  ?  ?  ?  ?  ?  ?  ?  ?  ?  ?  ?   ? ? ? ?Pertinent Vitals/Pain Pain Assessment ?Pain Assessment: No/denies pain  ? ? ?  Home Living Family/patient expects to be discharged to:: Private residence ?  ?Available Help at Discharge: Family;Available PRN/intermittently ?  ?Home Access: Stairs to enter ?Entrance Stairs-Rails: Right;Left ?Entrance Stairs-Number of Steps:  3 ?  ?Home Layout: One level ?Home Equipment: Rollator (4 wheels);BSC/3in1;Shower seat;Tub bench ?Additional Comments: 2 daughters assist pt, alternating weeks, one stays with her but other uses video monitor (she may be able to arrange staying with pt in her home if she can get permission at work to work remotely)  ?  ?Prior Function Prior Level of Function : Independent/Modified Independent ?  ?  ?  ?  ?  ?  ?Mobility Comments: walks with rollator, 3-4 falls in past 6 months ?ADLs Comments: independent ?  ? ? ?Hand Dominance  ?   ? ?  ?Extremity/Trunk Assessment  ? Upper Extremity Assessment ?Upper Extremity Assessment: Defer to OT evaluation ?  ? ?Lower Extremity Assessment ?Lower Extremity Assessment: Generalized weakness;RLE deficits/detail;LLE deficits/detail ?RLE Deficits / Details: crepitus noted R knee (pt reports Dr told her she needs a knee replacement but she's too old), knee ext 4/5 ?RLE Sensation: WNL ?LLE Deficits / Details: knee ext 4/5 ?LLE Sensation: WNL ?  ? ?Cervical / Trunk Assessment ?Cervical / Trunk Assessment: Kyphotic  ?Communication  ? Communication: HOH  ?Cognition Arousal/Alertness: Awake/alert ?Behavior During Therapy: [facility] for tasks assessed/performed ?Overall Cognitive Status: Within Functional Limits for tasks assessed ?  ?  ?  ?  ?  ?  ?  ?  ?  ?  ?  ?  ?  ?  ?  ?  ?  ?  ?  ? ?  ?General Comments   ? ?  ?Exercises    ? ?Assessment/Plan  ?  ?PT Assessment Patient needs continued PT services  ?PT Problem List Decreased strength;Decreased mobility;Decreased activity tolerance;Decreased balance;Pain ? ?   ?  ?PT Treatment Interventions Therapeutic activities;Therapeutic exercise;Gait training;Patient/family education;Functional mobility training   ? ?PT Goals (Current goals can be found in the Care Plan section)  ?Acute Rehab PT Goals ?Patient Stated Goal: get strong enough to go home ?PT Goal Formulation: With patient ?Time For Goal Achievement: 01/28/22 ?Potential to Achieve Goals:  Good ? ?  ?Frequency Min 3X/week ?  ? ? ?Co-evaluation PT/OT/SLP Co-Evaluation/Treatment: Yes ?  ?PT goals addressed during session: Mobility/safety with mobility;Balance;Proper use of DME ?  ?  ? ? ?  ?AM-PAC PT "6 Clicks" Mobility  ?Outcome Measure Help needed turning from your back to your side while in a flat bed without using bedrails?: A Little ?Help needed moving from lying on your back to sitting on the side of a flat bed without using bedrails?: A Lot ?Help needed moving to and from a bed to a chair (including a wheelchair)?: A Little ?Help needed standing up from a chair using your arms (e.g., wheelchair or bedside chair)?: A Lot ?Help needed to walk in hospital room?: A Little ?Help needed climbing 3-5 steps with a railing? : Total ?6 Click Score: 14 ? ?  ?End of Session Equipment Utilized During Treatment: Gait belt ?Activity Tolerance: Patient tolerated treatment well ?Patient left: in chair;with chair alarm set;with call bell/phone within reach;with family/visitor present ?Nurse Communication: Mobility status ?PT Visit Diagnosis: Difficulty in walking, not elsewhere classified (R26.2);History of falling (Z91.81) ?  ? ?Time: 1351-1419 ?PT Time Calculation (min) (ACUTE ONLY): 28 min ? ? ?Charges:   PT Evaluation ?$PT Eval Moderate Complexity: 1 Mod ?PT Treatments ?$Therapeutic Activity: 8-22 mins ?  ?   ? ?  Ralene BatheUhlenberg, Sharmaine Bain Kistler PT 01/14/2022  ?Acute Rehabilitation Services ?Pager [phone] ?Office [phone] ? ? ?

## 2022-01-14 NOTE — Evaluation (Signed)
Occupational Therapy Evaluation ?Patient Details ?Name: [name] ?MRN: [id number] ?DOB: [birth year] ?Today's Date: 01/14/2022 ? ? ?History of Present Illness 88 y.o. female with medical history significant of stroke, hypertension, recurrent UTI, CKD 3, CAD, HLD,diastolic CHF, GERD, gout, macular degeneration, glaucoma presenting with weakness , increased urinary frequency and incontinence. While trying to stand family noticed patient to be weak , felt very weak in both legs,fell,was unable to support herself. Dx of UTI.  ? ?Clinical Impression ?  ?[name] is a 88 year old woman admitted to hospital with above medical history and presents with generalized weakness, decreased activity tolerance and impaired balance. On evaluation she is requiring increased assistance for ADLs and exhibits limited mobility compared to her baseline. Patient has assistance of two daughters - one daughter who works remotely and one that cannot. She is borderline in regards to discharge disposition - if she works with therapy and mobilizes she should be able to go home with some more supervision and HH therapies. If she does not progress in order to perform some activities on her own she may need rehab. Patient will benefit from skilled OT services while in hospital to improve deficits and learn compensatory strategies as needed in order to return to PLOF.     ?   ? ?Recommendations for follow up therapy are one component of a multi-disciplinary discharge planning process, led by the attending physician.  Recommendations may be updated based on patient status, additional functional criteria and insurance authorization.  ? ?Follow Up Recommendations ? Home health OT  ?  ?Assistance Recommended at Discharge Frequent or constant Supervision/Assistance  ?Patient can return home with the following A lot of help with bathing/dressing/bathroom;A little help with walking and/or transfers;Assistance with cooking/housework;Help with stairs  or ramp for entrance ? ?  ?Functional Status Assessment ? Patient has had a recent decline in their functional status and demonstrates the ability to make significant improvements in function in a reasonable and predictable amount of time.  ?Equipment Recommendations ? None recommended by OT  ?  ?Recommendations for Other Services   ? ? ?  ?Precautions / Restrictions Precautions ?Precautions: Fall ?Precaution Comments: 3-4 falls in past 6 months, R knee feels like it could buckle per patient ?Restrictions ?Weight Bearing Restrictions: No  ? ?  ? ?Mobility Bed Mobility ?  ?  ?  ?  ?  ?  ?  ?  ?  ? ?Transfers ?  ?  ?  ?  ?  ?  ?  ?  ?  ?  ?  ? ?  ?Balance Overall balance assessment: Needs assistance ?Sitting-balance support: No upper extremity supported, Feet supported ?Sitting balance-Leahy Scale: Good ?  ?  ?Standing balance support: Bilateral upper extremity supported, Reliant on assistive device for balance ?Standing balance-Leahy Scale: Poor ?Standing balance comment: relies on BUE support ?  ?  ?  ?  ?  ?  ?  ?  ?  ?  ?  ?   ? ?ADL either performed or assessed with clinical judgement  ? ?ADL Overall ADL's : Needs assistance/impaired ?Eating/Feeding: Set up;Sitting ?  ?Grooming: Set up;Sitting ?  ?Upper Body Bathing: Set up;Sitting ?  ?Lower Body Bathing: Sit to/from stand;Moderate assistance ?  ?Upper Body Dressing : Set up;Sitting ?  ?Lower Body Dressing: Moderate assistance;Sit to/from stand ?  ?Toilet Transfer: Minimal assistance;BSC/3in1;Rolling walker (2 wheels) ?  ?Toileting- Clothing Manipulation and Hygiene: Moderate assistance;Sit to/from stand ?Toileting - Clothing Manipulation Details (indicate cue  type and reason): needs assistance with perianal care though she did attempt. Difficulty being on [facility] ?  ?  ?Functional mobility during ADLs: Minimal assistance;Rolling walker (2 wheels) ?   ? ? ? ?Vision Patient Visual Report: No change from baseline ?   ?   ?Perception   ?  ?Praxis   ?  ? ?Pertinent  Vitals/Pain Pain Assessment ?Pain Assessment: No/denies pain  ? ? ? ?Hand Dominance   ?  ?Extremity/Trunk Assessment Upper Extremity Assessment ?Upper Extremity Assessment: Generalized weakness ?  ?Lower Extremity Assessment ?Lower Extremity Assessment: Defer to PT evaluation ?RLE Deficits / Details: crepitus noted R knee (pt reports Dr told her she needs a knee replacement but she's too old), knee ext 4/5 ?RLE Sensation: WNL ?LLE Deficits / Details: knee ext 4/5 ?LLE Sensation: WNL ?  ?Cervical / Trunk Assessment ?Cervical / Trunk Assessment: Kyphotic ?  ?Communication Communication ?Communication: HOH ?  ?Cognition Arousal/Alertness: Awake/alert ?Behavior During Therapy: [facility] for tasks assessed/performed ?Overall Cognitive Status: Within Functional Limits for tasks assessed ?  ?  ?  ?  ?  ?  ?  ?  ?  ?  ?  ?  ?  ?  ?  ?  ?  ?  ?  ?General Comments    ? ?  ?Exercises   ?  ?Shoulder Instructions    ? ? ?Home Living Family/patient expects to be discharged to:: Private residence ?  ?Available Help at Discharge: Family;Available PRN/intermittently ?  ?Home Access: Stairs to enter ?Entrance Stairs-Number of Steps: 3 ?Entrance Stairs-Rails: Right;Left ?Home Layout: One level ?  ?  ?Bathroom Shower/Tub: Tub/shower unit ?  ?Bathroom Toilet: Handicapped height ?  ?  ?Home Equipment: Rollator (4 wheels);BSC/3in1;Shower seat;Tub bench ?  ?Additional Comments: 2 daughters assist pt, alternating weeks, one stays with her but other uses video monitor (she may be able to arrange staying with pt in her home if she can get permission at work to work remotely) ?  ? ?  ?Prior Functioning/Environment Prior Level of Function : Independent/Modified Independent ?  ?  ?  ?  ?  ?  ?Mobility Comments: walks with rollator, 3-4 falls in past 6 months ?ADLs Comments: independent ?  ? ?  ?  ?OT Problem List: Decreased strength;Decreased activity tolerance;Impaired balance (sitting and/or standing);Decreased knowledge of use of DME or  AE;Decreased knowledge of precautions;Cardiopulmonary status limiting activity ?  ?   ?OT Treatment/Interventions: Self-care/ADL training;Therapeutic exercise;DME and/or AE instruction;Therapeutic activities;Balance training;Patient/family education  ?  ?OT Goals(Current goals can be found in the care plan section) Acute Rehab OT Goals ?Patient Stated Goal: return to independence ?OT Goal Formulation: With patient ?Time For Goal Achievement: 01/28/22 ?Potential to Achieve Goals: Good  ?OT Frequency: Min 2X/week ?  ? ?Co-evaluation PT/OT/SLP Co-Evaluation/Treatment: Yes (co eval) ?  ?PT goals addressed during session: Mobility/safety with mobility;Balance;Proper use of DME ?  ?  ? ?  ?AM-PAC OT "6 Clicks" Daily Activity     ?Outcome Measure Help from another person eating meals?: A Little ?Help from another person taking care of personal grooming?: A Little ?Help from another person toileting, which includes using toliet, bedpan, or urinal?: A Lot ?Help from another person bathing (including washing, rinsing, drying)?: A Lot ?Help from another person to put on and taking off regular upper body clothing?: A Little ?Help from another person to put on and taking off regular lower body clothing?: A Lot ?6 Click Score: 15 ?  ?End of Session Equipment Utilized During Treatment:  Rolling walker (2 wheels);Gait belt ?Nurse Communication: Mobility status ? ?Activity Tolerance: Patient tolerated treatment well ?Patient left: in chair;with call bell/phone within reach;with chair alarm set;with family/visitor present ? ?OT Visit Diagnosis: Muscle weakness (generalized) (M62.81)  ?              ?Time: 1610-96041401-1419 ?OT Time Calculation (min): 18 min ?Charges:  OT General Charges ?$OT Visit: 1 Visit ?OT Evaluation ?$OT Eval Low Complexity: 1 Low ? ?Allsion N[place]ales, OTR/L ?Acute Care Rehab Services  ?Office [phone] ?Pager: [phone]  ? ?Fredericka Bottcher L Kamani Lewter ?01/14/2022, 3:37 PM ?

## 2022-01-14 NOTE — ED Notes (Signed)
Admitting provider at bedside.

## 2022-01-14 NOTE — Plan of Care (Signed)

## 2022-01-15 LAB — CBC
HCT: 33.4 % — ABNORMAL LOW (ref 36.0–46.0)
Hemoglobin: 10.1 g/dL — ABNORMAL LOW (ref 12.0–15.0)
MCH: 27.8 pg (ref 26.0–34.0)
MCHC: 30.2 g/dL (ref 30.0–36.0)
MCV: 92 fL (ref 80.0–100.0)
Platelets: 225 10*3/uL (ref 150–400)
RBC: 3.63 MIL/uL — ABNORMAL LOW (ref 3.87–5.11)
RDW: 15.5 % (ref 11.5–15.5)
WBC: 12 10*3/uL — ABNORMAL HIGH (ref 4.0–10.5)
nRBC: 0 % (ref 0.0–0.2)

## 2022-01-15 MED ORDER — FUROSEMIDE 20 MG PO TABS
20.0000 mg | ORAL_TABLET | Freq: Every day | ORAL | Status: DC
  Administered 2022-01-15 – 2022-01-17 (×3): 20 mg via ORAL
  Filled 2022-01-15 (×3): qty 1

## 2022-01-15 NOTE — Plan of Care (Signed)
?  Problem: Activity: ?Goal: Risk for activity intolerance will decrease ?Outcome: Progressing ?  ?Problem: Nutrition: ?Goal: Adequate nutrition will be maintained ?Outcome: Progressing ?  ?Problem: Coping: ?Goal: Level of anxiety will decrease ?Outcome: Progressing ?  ?Problem: Elimination: ?Goal: Will not experience complications related to bowel motility ?Outcome: Progressing ?Goal: Will not experience complications related to urinary retention ?Outcome: Progressing ?  ?Problem: Safety: ?Goal: Ability to remain free from injury will improve ?Outcome: Progressing ?  ?

## 2022-01-15 NOTE — Progress Notes (Signed)
?PROGRESS NOTE ?[name]  [id number] DOB: 08-Jan-1923 DOA: 01/13/2022 ?PCP: [name], DO  ? ?Brief Narrative/Hospital Course: ?88 y.o. female with medical history significant of stroke, hypertension, recurrent UTI, CKD 3, CAD, HLD,diastolic CHF, GERD, gout, macular degeneration, glaucoma presenting with weakness , increased urinary frequency and incontinence. While trying to stand family noticed patient to be weak , felt very weak in both legs,fell,was unable to support herself. Typically she is able to walk at baseline with assistance of a walker.  Patient had similar presentation from UTI in the past. ?In the ED work-up showed creatinine 1.4 leukocytosis UA with WBC 0-5 RBC more than 50 LE small. ?CT head without acute abnormality.  CT renal stone study showing findings consistent with cystitis.  Patient received hydralazine and ceftriaxone in the ED and was admitted for further management  ?  ?Subjective: ?Seen and examined this morning.  Daughter at the bedside she feels patient is stronger today than yesterday.  However later patient was having leg pain and difficulty with walking after using the restroom ?Overnight afebrile, noted bump in WBC count. ? ?Assessment and Plan: ?Principal Problem: ?  UTI (urinary tract infection) ?Active Problems: ?  History of CVA (cerebrovascular accident) ?  Essential hypertension ?  Hyperkalemia ?  CKD (chronic kidney disease), stage III ([place]) ?  Coronary artery disease ?  Hyperlipidemia ?  Chronic diastolic heart failure ([place]) ?  Gastroesophageal reflux disease ?  Glaucoma ?  Macular degeneration ?  Cystitis ?  ?UTI ?History of previous UTI ?Generalized weakness/deconditioning in the setting of UTI: ?Gram-negative rods 50,000 colonies in urine culture continue ceftriaxone, noted bump in WBC count we will keep on IV antibiotic for next 24 hours follow-up culture data continue PT OT supportive care  ?  ?CKD stage IIIb:  ?baseline creatinine close to 1.4, remains  stable. ?  ?Chronic diastolic CHF ?Chronic leg edema: ?G1 DD EF 65 to 70% in 2016.  Does have leg edema which is improving.  I will resume home Lasix.  Monitor volume status.Net IO Since Admission: 658 mL [01/15/22 1137]  ?  ?CAD ?Hypertension  ?HLD: ?with poorly controlled blood pressure in the ED responded to hydralazine IV, on home metoprolol.  On aspirin, Lipitor. ?  ?History of CVA: ?continue her aspirin Lipitor. ?  ?Macular degeneration ?Glaucoma: ?Continue eyedrops. ? ?DVT prophylaxis: enoxaparin (LOVENOX) injection 30 mg Start: 01/14/22 1000 ?Code Status:   Code Status: DNR ?Family Communication: plan of care discussed with patient at bedside.  Discussed with daughter at the bedside ?Patient status is: inpatient Level of care: Telemetry  ?Remains inpatient because: Ongoing management of UTI deconditioning ?Patient currently not stable ? ?Dispo: The patient is from:  home alone. ?           Anticipated disposition: Home health with 24/7 supervision versus SNF ? ?Mobility Assessment (last 72 hours)   ? ? Mobility Assessment   ? ? [place] Name 01/15/22 [id number] 01/14/22 2200 01/14/22 1534 01/14/22 1445 01/14/22 1435  ? Does patient have an order for bedrest or is patient medically unstable No - Continue assessment No - Continue assessment -- No - Continue assessment --  ? What is the highest level of mobility based on the progressive mobility assessment? Level 5 (Walks with assist in room/hall) - Balance while stepping forward/back and can walk in room with assist - Complete Level 5 (Walks with assist in room/hall) - Balance while stepping forward/back and can walk in room with assist - Complete Level 4 (Walks  with assist in room) - Balance while marching in place and cannot step forward and back - Complete Level 4 (Walks with assist in room) - Balance while marching in place and cannot step forward and back - Complete Level 4 (Walks with assist in room) - Balance while marching in place and cannot step forward and  back - Complete  ? Is the above level different from baseline mobility prior to current illness? -- -- -- Yes - Recommend PT order --  ? ? [place] Name 01/14/22 [id number]  ?  ?  ?  ?  ? Does patient have an order for bedrest or is patient medically unstable No - Continue assessment      ? What is the highest level of mobility based on the progressive mobility assessment? Level 3 (Stands with assist) - Balance while standing  and cannot march in place      ? Is the above level different from baseline mobility prior to current illness? Yes - Recommend PT order      ? ?  ?  ? ?  ?  ? ?Objective: ?Vitals last 24 hrs: ?Vitals:  ? 01/14/22 1818 01/14/22 2144 01/15/22 0411 01/15/22 0953  ?BP:  (!) 185/86 (!) 163/78 134/67  ?Pulse:  81 67 70  ?Resp:  18 18   ?Temp: 97.6 ?F (36.4 ?C) (!) 97.5 ?F (36.4 ?C) 98.3 ?F (36.8 ?C)   ?TempSrc: Oral Oral Oral   ?SpO2:  98% 100%   ?Weight:      ?Height:      ? ?Weight change:  ? ?Physical Examination: ?General exam: AA0 at baseline, hard of hearing, pleasant,older than stated age, weak appearing. ?HEENT:Oral mucosa moist, Ear/Nose WNL grossly, dentition normal. ?Respiratory system: bilaterally diminished BS, no use of accessory muscle ?Cardiovascular system: S1 & S2 +, No JVD,. ?Gastrointestinal system: Abdomen soft,NT,ND, BS+ ?Nervous System:Alert, awake, moving extremities and grossly nonfocal ?Extremities: LE edema present chronic appearing and improving,distal peripheral pulses palpable.  ?Skin: No rashes,no icterus. ?MSK: Normal muscle bulk,tone, power ? ?Medications reviewed:  ?Scheduled Meds: ? aspirin  81 mg Oral Daily  ? atorvastatin  80 mg Oral q1800  ? brimonidine  1 drop Both Eyes BID  ? enoxaparin (LOVENOX) injection  30 mg Subcutaneous Q24H  ? famotidine  20 mg Oral Daily  ? furosemide  20 mg Oral Daily  ? Loteprednol Etabonate   Ophthalmic 6 X Daily  ? metoprolol tartrate  25 mg Oral BID  ? sodium chloride  1 application. Right Eye QHS  ? sodium chloride  1 drop Right Eye QID  ?  sodium chloride flush  3 mL Intravenous Q12H  ? timolol  1 drop Both Eyes q morning  ? Vibegron  75 mg Oral Daily  ? ?Continuous Infusions: ? cefTRIAXone (ROCEPHIN)  IV 1 g (01/14/22 2201)  ? ? ?  ?Diet Order   ? ?       ?  Diet Heart Room service appropriate? Yes; Fluid consistency: Thin  Diet effective now       ?  ? ?  ?  ? ?  ?  ? ?  ?  ?  ? ? ?Intake/Output Summary (Last 24 hours) at 01/15/2022 1137 ?Last data filed at 01/15/2022 [id number] ?Gross per 24 hour  ?Intake 720 ml  ?Output 302 ml  ?Net 418 ml  ? ?Net IO Since Admission: 658 mL [01/15/22 1137]  ?Wt Readings from Last 3 Encounters:  ?01/14/22 63.8 kg  ?06/22/21 60.8 kg  ?  12/21/20 59 kg  ?  ? ?Unresulted Labs (From admission, onward)  ? ?  Start     Ordered  ? 01/21/22 0500  Creatinine, serum  (enoxaparin (LOVENOX)    CrCl < 30 ml/min)  Weekly,   R     ?Comments: while on enoxaparin therapy. ?  ? 01/14/22 0102  ? 01/15/22 1015  CBC  Once,   R       ?Question:  Specimen collection method  Answer:  Lab=Lab collect  ? 01/15/22 1014  ? ?  ?  ? ?  ?Data Reviewed: I have personally reviewed following labs and imaging studies ?CBC: ?Recent Labs  ?Lab Feb 11, 2022 ?2237 Feb 11, 2022 ?2243 01/14/22 ?[id number]  ?WBC 16.5*  --  18.3*  ?HGB 12.7 13.9 13.2  ?HCT 40.9 41.0 41.7  ?MCV 91.3  --  90.7  ?PLT 322  --  310  ? ?Basic Metabolic Panel: ?Recent Labs  ?Lab 2022-02-11 ?2237 11-Feb-2022 ?2243 01/14/22 ?[id number]  ?NA 139 138 139  ?K 4.9 4.8 4.6  ?CL 112* 110 108  ?CO2 19*  --  19*  ?GLUCOSE 129* 129* 140*  ?BUN 49* 43* 47*  ?CREATININE 1.42* 1.50* 1.44*  ?CALCIUM 9.1  --  9.0  ?MG 1.8  --   --   ?PHOS 3.8  --   --   ? ?GFR: ?Estimated Creatinine Clearance: 18.6 mL/min (A) (by C-G formula based on SCr of 1.44 mg/dL (H)). ?Liver Function Tests: ?No results for input(s): AST, ALT, ALKPHOS, BILITOT, PROT, ALBUMIN in the last 168 hours. ?No results for input(s): LIPASE, AMYLASE in the last 168 hours. ?No results for input(s): AMMONIA in the last 168 hours. ?Coagulation Profile: ?No results for  input(s): INR, PROTIME in the last 168 hours. ?BNP (last 3 results) ?No results for input(s): PROBNP in the last 8760 hours. ?HbA1C: ?No results for input(s): HGBA1C in the last 72 hours. ?CBG: ?Recent Labs  ?Lab 04/

## 2022-01-15 NOTE — NC FL2 (Signed)
?[place] MEDICAID FL2 LEVEL OF CARE SCREENING TOOL  ?  ? ?IDENTIFICATION  ?Patient Name: ?[name] Birthdate: 04-11-1923 Sex: female Admission Date (Current Location): ?01/13/2022  ?[place] and [place] Number: ? [place] ?  Facility and Address:  ?[facility],  [place], [place] ?     Provider Number: ?   ?Attending Physician Name and Address:  ?Antonieta Pert, MD ? Relative Name and Phone Number:  ?  ?   ?Current Level of Care: ?Hospital Recommended Level of Care: ?[place] Prior Approval Number: ?  ? ?Date Approved/Denied: ?  PASRR Number: ?[id number] ? ?Discharge Plan: ?SNF ?  ? ?Current Diagnoses: ?Patient Active Problem List  ? Diagnosis Date Noted  ? UTI (urinary tract infection) 01/14/2022  ? Glaucoma 01/14/2022  ? Macular degeneration 01/14/2022  ? Cystitis 01/14/2022  ? 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 Chronic diastolic heart failure ([place]) 10/07/2020  ? Cough 10/07/2020  ? Diabetic renal disease ([place]) 10/07/2020  ? Encounter for general adult medical examination without abnormal findings 10/07/2020  ? Gastroesophageal reflux disease 10/07/2020  ? Generalized osteoarthritis 10/07/2020  ? Gout 10/07/2020  ? 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 Iron deficiency anemia 10/07/2020  ? Knee pain 10/07/2020  ? Osteoporosis 10/07/2020  ? Primary pulmonary hypertension ([place]) 10/07/2020  ? Recurrent falls 10/07/2020  ? Tricuspid valve disorder 10/07/2020  ? Vitamin B12 deficiency (non anemic) 10/07/2020  ? Vitamin D deficiency 10/07/2020  ? Bilateral elbow joint pain 03/17/2020  ? 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 Pain of left hip joint 06/12/2019  ? Pain in joint of left shoulder 05/03/2018  ? Osteoarthritis of knee 12/28/2017  ? Upper airway cough syndrome 07/24/2017  ? Coronary artery disease 11/12/2015  ? Hyperlipidemia 11/12/2015   ? TIA (transient ischemic attack)   ? NSTEMI (non-ST elevated myocardial infarction) ([place]) 08/18/2015  ? History of CVA (cerebrovascular accident) 09/26/2012  ? 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 UTI (lower urinary tract infection) 09/26/2012  ? Hyperkalemia 09/26/2012  ? CKD (chronic kidney disease), stage III ([place]) 09/26/2012  ? Essential hypertension   ? ? ?Orientation RESPIRATION BLADDER Height & Weight   ?  ?Self, Time, Situation, Place ? Normal External catheter (currently with purewick) Weight: 140 lb 10.5 oz (63.8 kg) ?Height:  5' (152.4 cm)  ?BEHAVIORAL SYMPTOMS/MOOD NEUROLOGICAL BOWEL NUTRITION STATUS  ?    Continent    ?AMBULATORY STATUS COMMUNICATION OF NEEDS Skin   ?Limited Assist Verbally Normal ?  ?  ?  ?    ?     ?     ? ? ?Personal Care Assistance Level of Assistance  ?Bathing, Dressing Bathing Assistance: Limited assistance ?  ?Dressing Assistance: Limited assistance ?   ? ?Functional Limitations Info  ?    ?  ?   ? ? ?SPECIAL CARE FACTORS FREQUENCY  ?PT (By licensed PT), OT (By licensed OT)   ?  ?PT Frequency: 5x/wk ?OT Frequency: 5x/wk ?  ?  ?  ?   ? ? ?Contractures Contractures Info: Not present  ? ? ?Additional Factors Info  ?Code Status, Allergies Code Status Info: DNR ?Allergies Info: Meloxicam, Nitrofurantoin, Other, Guaifenesin Er, Nitrofuran Derivatives ?  ?  ?  ?   ? ?Current Medications (01/15/2022):  This is the current hospital active medication list ?Current Facility-Administered Medications  ?Medication Dose Route Frequency Provider Last  Rate Last Admin  ? acetaminophen (TYLENOL) tablet 650 mg  650 mg Oral Q6H PRN Marcelyn Bruins, MD   650 mg at 01/15/22 1209  ? Or  ? acetaminophen (TYLENOL) suppository 650 mg  650 mg Rectal Q6H PRN Marcelyn Bruins, MD      ? aspirin chewable tablet 81 mg  81 mg Oral Daily Marcelyn Bruins, MD   81 mg at 01/15/22 [id number]  ? atorvastatin (LIPITOR) tablet 80 mg  80 mg Oral q1800 Marcelyn Bruins, MD   80 mg at 01/14/22 1812  ?  brimonidine (ALPHAGAN) 0.2 % ophthalmic solution 1 drop  1 drop Both Eyes BID Marcelyn Bruins, MD   1 drop at 01/15/22 [id number]  ? cefTRIAXone (ROCEPHIN) 1 g in sodium chloride 0.9 % 100 mL IVPB  1 g Intravenous Q24H Marcelyn Bruins, MD 200 mL/hr at 01/14/22 2201 1 g at 01/14/22 2201  ? enoxaparin (LOVENOX) injection 30 mg  30 mg Subcutaneous Q24H Marcelyn Bruins, MD   30 mg at 01/15/22 [id number]  ? famotidine (PEPCID) tablet 20 mg  20 mg Oral Daily Marcelyn Bruins, MD   20 mg at 01/15/22 [id number]  ? furosemide (LASIX) tablet 20 mg  20 mg Oral Daily Kc, Ramesh, MD   20 mg at 01/15/22 1209  ? labetalol (NORMODYNE) injection 5 mg  5 mg Intravenous Q2H PRN Marcelyn Bruins, MD      ? Loteprednol Etabonate 0.38 % GEL   Ophthalmic 6 X Daily Marcelyn Bruins, MD   Given at 01/15/22 1425  ? metoprolol tartrate (LOPRESSOR) tablet 25 mg  25 mg Oral BID Marcelyn Bruins, MD   25 mg at 01/15/22 [id number]  ? polyethylene glycol (MIRALAX / GLYCOLAX) packet 17 g  17 g Oral Daily PRN Marcelyn Bruins, MD      ? sodium chloride (MURO 128) 5 % ophthalmic ointment 1 application.  1 application. Right Eye QHS Marcelyn Bruins, MD   1 application. at 01/14/22 2204  ? sodium chloride (MURO 128) 5 % ophthalmic solution 1 drop  1 drop Right Eye QID Marcelyn Bruins, MD   1 drop at 01/15/22 1426  ? sodium chloride flush (NS) 0.9 % injection 3 mL  3 mL Intravenous Q12H Marcelyn Bruins, MD   3 mL at 01/15/22 0948  ? timolol (TIMOPTIC) 0.5 % ophthalmic solution 1 drop  1 drop Both Eyes q morning Kc, Ramesh, MD   1 drop at 01/15/22 0949  ? Vibegron TABS 75 mg  75 mg Oral Daily Minda Ditto, RPH   75 mg at 01/15/22 [id number]  ? ? ? ?Discharge Medications: ?Please see discharge summary for a list of discharge medications. ? ?Relevant Imaging Results: ? ?Relevant Lab Results: ? ? ?Additional Information ?SS# [id number] ? ?Matylda Fehring, LCSW ? ? ? ? ?

## 2022-01-15 NOTE — Progress Notes (Signed)
Occupational Therapy Treatment ?Patient Details ?Name: [name] ?MRN: [id number] ?DOB: 1923-04-23 ?Today's Date: 01/15/2022 ? ? ?History of present illness 88 y.o. female with medical history significant of stroke, hypertension, recurrent UTI, CKD 3, CAD, HLD,diastolic CHF, GERD, gout, macular degeneration, glaucoma presenting with weakness , increased urinary frequency and incontinence. While trying to stand family noticed patient to be weak , felt very weak in both legs,fell,was unable to support herself. Dx of UTI. ?  ?OT comments ? Treatment focused on improving patient's independence with ADLs and functional mobility in order for patient to safely return home at discharge. Patient overall min guard for walking to bathroom, toileting and standing grooming tasks. She wasn't able to fully control her bladder and had some incontinence with initial standing. When she returned to recliner she reported bilateral thigh pain. She reported fatigue with short activity. Patient will need to be able to be by herself during the day in order for her to safely go home. Encouraged patient to be up to bathroom or BSC during the day to work on increasing activity tolerance. If patient continues to have some activity tolerance deficits will need to update POC to recommend short term rehab. Will continue to follow acutely.  ? ?Recommendations for follow up therapy are one component of a multi-disciplinary discharge planning process, led by the attending physician.  Recommendations may be updated based on patient status, additional functional criteria and insurance authorization. ?   ?Follow Up Recommendations ? Home health OT  ?  ?Assistance Recommended at Discharge Frequent or constant Supervision/Assistance  ?Patient can return home with the following ? A lot of help with bathing/dressing/bathroom;A little help with walking and/or transfers;Assistance with cooking/housework;Help with stairs or ramp for entrance ?  ?Equipment  Recommendations ? None recommended by OT  ?  ?Recommendations for Other Services   ? ?  ?Precautions / Restrictions Precautions ?Precautions: Fall ?Precaution Comments: 3-4 falls in past 6 months, R knee feels like it could buckle per patient ?Restrictions ?Weight Bearing Restrictions: No  ? ? ?  ? ?Mobility Bed Mobility ?  ?  ?  ?  ?  ?  ?  ?  ?  ? ?Transfers ?  ?  ?  ?  ?  ?  ?  ?  ?  ?  ?  ?  ?Balance   ?Sitting-balance support: No upper extremity supported, Feet supported ?  ?  ?  ?Standing balance support: Reliant on assistive device for balance, Single extremity supported ?Standing balance-Leahy Scale: Poor ?Standing balance comment: able to take one hand off of device for ADLs ?  ?  ?  ?  ?  ?  ?  ?  ?  ?  ?  ?   ? ?ADL either performed or assessed with clinical judgement  ? ?ADL Overall ADL's : Needs assistance/impaired ?  ?  ?Grooming: Oral care;Wash/dry hands;Standing;Min guard ?Grooming Details (indicate cue type and reason): stood at sink to perform grooming ?  ?  ?  ?  ?  ?  ?Lower Body Dressing: Moderate assistance ?Lower Body Dressing Details (indicate cue type and reason): needs assistance to don socks and underwear - due to high seated surface and her short stature. Able to manage clothing from knee. ?Toilet Transfer: Min guard;Regular Toilet;Grab bars;Rolling walker (2 wheels) ?  ?Toileting- Architect and Hygiene: Min guard;Sit to/from stand ?Toileting - Clothing Manipulation Details (indicate cue type and reason): able to manage cleaning and clothing ?  ?  ?Functional mobility  during ADLs: Min guard;Rolling walker (2 wheels) ?General ADL Comments: Min guard with RW to ambulate in room and back and forth from bathroom. Reports sore thighs after activity. ?  ? ?Extremity/Trunk Assessment Upper Extremity Assessment ?Upper Extremity Assessment: Overall WFL for tasks assessed ?  ?  ?  ?  ?  ? ?Vision Patient Visual Report: No change from baseline ?  ?  ?Perception   ?  ?Praxis   ?   ? ?Cognition Arousal/Alertness: Awake/alert ?Behavior During Therapy: [facility] for tasks assessed/performed ?Overall Cognitive Status: Within Functional Limits for tasks assessed ?  ?  ?  ?  ?  ?  ?  ?  ?  ?  ?  ?  ?  ?  ?  ?  ?General Comments: HOH ?  ?  ?   ?Exercises   ? ?  ?Shoulder Instructions   ? ? ?  ?General Comments    ? ? ?Pertinent Vitals/ Pain       Pain Assessment ?Pain Assessment: Faces ?Faces Pain Scale: Hurts a little bit ?Pain Location: Bilateral thighs ?Pain Descriptors / Indicators: Grimacing, Aching (rubbing thighs) ?Pain Intervention(s): Monitored during session ? ?Home Living   ?  ?  ?  ?  ?  ?  ?  ?  ?  ?  ?  ?  ?  ?  ?  ?  ?  ?  ? ?  ?Prior Functioning/Environment    ?  ?  ?  ?   ? ?Frequency ? Min 2X/week  ? ? ? ? ?  ?Progress Toward Goals ? ?OT Goals(current goals can now be found in the care plan section) ? Progress towards OT goals: Progressing toward goals ? ?Acute Rehab OT Goals ?Patient Stated Goal: return to independence ?OT Goal Formulation: With patient ?Time For Goal Achievement: 01/28/22 ?Potential to Achieve Goals: Good  ?Plan Discharge plan remains appropriate   ? ?Co-evaluation ? ? ?   ?  ?  ?OT goals addressed during session: ADL's and self-care ?  ? ?  ?AM-PAC OT "6 Clicks" Daily Activity     ?Outcome Measure ? ? Help from another person eating meals?: A Little ?Help from another person taking care of personal grooming?: A Little ?Help from another person toileting, which includes using toliet, bedpan, or urinal?: A Little ?Help from another person bathing (including washing, rinsing, drying)?: A Little ?  ?Help from another person to put on and taking off regular lower body clothing?: A Lot ?6 Click Score: 14 ? ?  ?End of Session Equipment Utilized During Treatment: Rolling walker (2 wheels);Gait belt ? ?OT Visit Diagnosis: Muscle weakness (generalized) (M62.81) ?  ?Activity Tolerance Patient tolerated treatment well ?  ?Patient Left in chair;with call bell/phone within  reach;with chair alarm set;with family/visitor present ?  ?Nurse Communication Mobility status ?  ? ?   ? ?Time: 8828-0034 ?OT Time Calculation (min): 25 min ? ?Charges: OT General Charges ?$OT Visit: 1 Visit ?OT Treatments ?$Self Care/Home Management : 23-37 mins ? ?Heidi Lemay, OTR/L ?Acute Care Rehab Services  ?Office [phone] ?Pager: [phone]  ? ?Othar Curto L Konnie Noffsinger ?01/15/2022, 12:38 PM ?

## 2022-01-16 LAB — URINE CULTURE: Culture: 50000 — AB

## 2022-01-16 LAB — BASIC METABOLIC PANEL
Anion gap: 8 (ref 5–15)
BUN: 44 mg/dL — ABNORMAL HIGH (ref 8–23)
CO2: 20 mmol/L — ABNORMAL LOW (ref 22–32)
Calcium: 8.3 mg/dL — ABNORMAL LOW (ref 8.9–10.3)
Chloride: 108 mmol/L (ref 98–111)
Creatinine, Ser: 1.31 mg/dL — ABNORMAL HIGH (ref 0.44–1.00)
GFR, Estimated: 37 mL/min — ABNORMAL LOW (ref >60)
Glucose, Bld: 109 mg/dL — ABNORMAL HIGH (ref 70–99)
Potassium: 5.3 mmol/L — ABNORMAL HIGH (ref 3.5–5.1)
Sodium: 136 mmol/L (ref 135–145)

## 2022-01-16 LAB — CBC
HCT: 36.9 % (ref 36.0–46.0)
Hemoglobin: 11.2 g/dL — ABNORMAL LOW (ref 12.0–15.0)
MCH: 28.1 pg (ref 26.0–34.0)
MCHC: 30.4 g/dL (ref 30.0–36.0)
MCV: 92.7 fL (ref 80.0–100.0)
Platelets: 236 10*3/uL (ref 150–400)
RBC: 3.98 MIL/uL (ref 3.87–5.11)
RDW: 15.3 % (ref 11.5–15.5)
WBC: 12.3 10*3/uL — ABNORMAL HIGH (ref 4.0–10.5)
nRBC: 0 % (ref 0.0–0.2)

## 2022-01-16 MED ORDER — SODIUM ZIRCONIUM CYCLOSILICATE 5 G PO PACK
5.0000 g | PACK | Freq: Once | ORAL | Status: AC
  Administered 2022-01-16: 5 g via ORAL
  Filled 2022-01-16: qty 1

## 2022-01-16 MED ORDER — HYDRALAZINE HCL 25 MG PO TABS
25.0000 mg | ORAL_TABLET | Freq: Three times a day (TID) | ORAL | Status: DC | PRN
  Administered 2022-01-17: 25 mg via ORAL
  Filled 2022-01-16: qty 1

## 2022-01-16 NOTE — Progress Notes (Signed)
Occupational Therapy Treatment ?Patient Details ?Name: [name] ?MRN: [id number] ?DOB: [birth year] ?Today's Date: 01/16/2022 ? ? ?History of present illness 88 y.o. female with medical history significant of stroke, hypertension, recurrent UTI, CKD 3, CAD, HLD,diastolic CHF, GERD, gout, macular degeneration, glaucoma presenting with weakness , increased urinary frequency and incontinence. While trying to stand family noticed patient to be weak , felt very weak in both legs,fell,was unable to support herself. Dx of UTI. ?  ?OT comments ? Patient requiring min assist today to power up from seated surfaces and gait appears to be slower than yesterday. She is holding on to grab and walker more when managing her clothing today.  Despite therapist's seeing patient daily - patient still is borderline hasn't improved enough to be alone for hours during the day. She reports she feels better and thinks she could manage at home. However, patient will not have near 24/7 supervision this week due to her daughter working during the week (daughters alternate weeks to assist mother, this week coming up the daughter who works during the day will be with her. Next week the daughter who can work from home stays with her).Will update POC to recommend short term rehab at discharge in order to maximize patient's physical abilities prior to return home.  ? ?Recommendations for follow up therapy are one component of a multi-disciplinary discharge planning process, led by the attending physician.  Recommendations may be updated based on patient status, additional functional criteria and insurance authorization. ?   ?Follow Up Recommendations ? Skilled nursing-short term rehab (<3 hours/day)  ?  ?Assistance Recommended at Discharge Frequent or constant Supervision/Assistance  ?Patient can return home with the following ? A little help with walking and/or transfers;A lot of help with bathing/dressing/bathroom;Assistance with  cooking/housework;Help with stairs or ramp for entrance ?  ?Equipment Recommendations ? None recommended by OT  ?  ?Recommendations for Other Services   ? ?  ?Precautions / Restrictions Precautions ?Precautions: Fall ?Precaution Comments: 3-4 falls in past 6 months, R knee feels like it could buckle per patient ?Restrictions ?Weight Bearing Restrictions: No  ? ? ?  ? ?Mobility Bed Mobility ?  ?  ?  ?  ?  ?  ?  ?  ?  ? ?Transfers ?  ?  ?  ?  ?  ?  ?  ?  ?  ?  ?  ?  ?Balance Overall balance assessment: Needs assistance ?Sitting-balance support: No upper extremity supported, Feet supported ?Sitting balance-Leahy Scale: Good ?  ?  ?Standing balance support: Reliant on assistive device for balance, Single extremity supported ?Standing balance-Leahy Scale: Poor ?Standing balance comment: able to take one hand off of device for ADLs ?  ?  ?  ?  ?  ?  ?  ?  ?  ?  ?  ?   ? ?ADL either performed or assessed with clinical judgement  ? ?ADL Overall ADL's : Needs assistance/impaired ?  ?  ?Grooming: Oral care;Wash/dry hands;Standing;Supervision/safety ?Grooming Details (indicate cue type and reason): stood at sink to perform grooming - therapist able to step away and watch from a distance ?  ?  ?  ?  ?  ?  ?Lower Body Dressing: Moderate assistance;Sit to/from stand ?Lower Body Dressing Details (indicate cue type and reason): needing assistance to don and doff mesh underwear - mostly due to elevated surface and patient very short. Increased assistance to pull up underwear today. Patient wanting to hold onto grab bar and walker today  and using one hand at a time. ?Toilet Transfer: Minimal assistance;Rolling walker (2 wheels) ?Toilet Transfer Details (indicate cue type and reason): MIn assist to power up from toilet today ?Toileting- Clothing Manipulation and Hygiene: Min guard;Sitting/lateral lean ?Toileting - Clothing Manipulation Details (indicate cue type and reason): able to manage cleaning and clothing ?  ?  ?Functional  mobility during ADLs: Minimal assistance;Rolling walker (2 wheels) ?General ADL Comments: Mn assist to stand from bed and toilet today. Appears to have slower gait. ?  ? ?Extremity/Trunk Assessment Upper Extremity Assessment ?Upper Extremity Assessment: Overall WFL for tasks assessed ?  ?  ?  ?  ?  ? ?Vision Patient Visual Report: No change from baseline ?  ?  ?Perception   ?  ?Praxis   ?  ? ?Cognition Arousal/Alertness: Awake/alert ?Behavior During Therapy: [facility] for tasks assessed/performed ?Overall Cognitive Status: Within Functional Limits for tasks assessed ?  ?  ?  ?  ?  ?  ?  ?  ?  ?  ?  ?  ?  ?  ?  ?  ?  ?  ?  ?   ?Exercises   ? ?  ?Shoulder Instructions   ? ? ?  ?General Comments    ? ? ?Pertinent Vitals/ Pain       Pain Assessment ?Pain Assessment: No/denies pain ? ?Home Living   ?  ?  ?  ?  ?  ?  ?  ?  ?  ?  ?  ?  ?  ?  ?  ?  ?  ?  ? ?  ?Prior Functioning/Environment    ?  ?  ?  ?   ? ?Frequency ? Min 2X/week  ? ? ? ? ?  ?Progress Toward Goals ? ?OT Goals(current goals can now be found in the care plan section) ? Progress towards OT goals: OT to reassess next treatment ? ?Acute Rehab OT Goals ?Patient Stated Goal: return to independent ?OT Goal Formulation: With patient ?Time For Goal Achievement: 01/28/22 ?Potential to Achieve Goals: Good  ?Plan Discharge plan needs to be updated   ? ?Co-evaluation ? ? ?   ?  ?  ?OT goals addressed during session: ADL's and self-care ?  ? ?  ?AM-PAC OT "6 Clicks" Daily Activity     ?Outcome Measure ? ? Help from another person eating meals?: A Little ?Help from another person taking care of personal grooming?: A Little ?Help from another person toileting, which includes using toliet, bedpan, or urinal?: A Lot ?  ?Help from another person to put on and taking off regular upper body clothing?: A Little ?Help from another person to put on and taking off regular lower body clothing?: A Lot ?6 Click Score: 13 ? ?  ?End of Session Equipment Utilized During Treatment: Rolling  walker (2 wheels);Gait belt ? ?OT Visit Diagnosis: Muscle weakness (generalized) (M62.81) ?  ?Activity Tolerance Patient tolerated treatment well ?  ?Patient Left in chair;with call bell/phone within reach;with chair alarm set;with family/visitor present ?  ?Nurse Communication Mobility status ?  ? ?   ? ?Time: 9628-3662 ?OT Time Calculation (min): 32 min ? ?Charges: OT General Charges ?$OT Visit: 1 Visit ?OT Treatments ?$Self Care/Home Management : 23-37 mins ? ?Scottlyn Mchaney, OTR/L ?Acute Care Rehab Services  ?Office [phone] ?Pager: [phone]  ? ?Melburn Treiber L Jabier Deese ?01/16/2022, 11:20 AM ?

## 2022-01-16 NOTE — Progress Notes (Signed)
?PROGRESS NOTE ?[name]  [id number] DOB: 04-22-1923 DOA: 01/13/2022 ?PCP: [name], DO  ? ?Brief Narrative/Hospital Course: ?88 y.o. female with medical history significant of stroke, hypertension, recurrent UTI, CKD 3, CAD, HLD,diastolic CHF, GERD, gout, macular degeneration, glaucoma presenting with weakness , increased urinary frequency and incontinence. While trying to stand family noticed patient to be weak , felt very weak in both legs,fell,was unable to support herself. Typically she is able to walk at baseline with assistance of a walker.  Patient had similar presentation from UTI in the past. ?In the ED work-up showed creatinine 1.4 leukocytosis UA with WBC 0-5 RBC more than 50 LE small. ?CT head without acute abnormality.  CT renal stone study showing findings consistent with cystitis.  Patient received hydralazine and ceftriaxone in the ED and was admitted for further management  ?  ?Subjective: ?Seen and examined this morning, resting comfortably in the bedside chair.  She is somewhat stronger.  Blood pressure was very high this morning ?K at 5.3 potassium downtrending ? ?Assessment and Plan: ?Principal Problem: ?  UTI (urinary tract infection) ?Active Problems: ?  History of CVA (cerebrovascular accident) ?  Essential hypertension ?  Hyperkalemia ?  CKD (chronic kidney disease), stage III ([place]) ?  Coronary artery disease ?  Hyperlipidemia ?  Chronic diastolic heart failure ([place]) ?  Gastroesophageal reflux disease ?  Glaucoma ?  Macular degeneration ?  Cystitis ?  ?Klebsiella UTI ?History of previous UTI ?Generalized weakness/deconditioning in the setting of UTI: ?50,000 colonies Klebsiella sensitive to ceftriaxone.  Continue the same.  Significantly leukocytosis on admission, improving.  Afebrile.  Continue current plan. ?  ?CKD stage IIIb:  ?baseline creatinine close to 1.4, remains stable. ? ?Hyperkalemia: Likely from CKD.  Not on potassium supplement.  On oral Lasix.  Lokelma 5 mg x 1.   Repeat BMP in AM.   ? ?Chronic diastolic CHF ?Chronic leg edema: ?G1 DD EF 65 to 70% in 2016.  Does have leg edema which is improving.  Continue home Lasix.  Elevate legs. compression stocking as tolerated monitor volume status.Net IO Since Admission: 688 mL [01/16/22 1023]  ?  ?CAD ?Hypertension  ?HLD: ?with poorly controlled blood pressure in the ED and again this am, added po hydralazine, cont home metoprolol and Lasix.  Continue home aspirin, Lipitor. ?  ?History of CVA: ?continue her aspirin Lipitor. ?  ?Macular degeneration ?Glaucoma: ?Continue eyedrops. ? ?DVT prophylaxis: enoxaparin (LOVENOX) injection 30 mg Start: 01/14/22 1000 ?Code Status:   Code Status: DNR ?Family Communication: plan of care discussed with patient at bedside.  Discussed with daughter at the bedside 4/8 ?Patient status is: inpatient Level of care: Telemetry  ?Remains inpatient because: Ongoing management of UTI deconditioning ?Patient currently not stable ? ?Dispo: The patient is from:  home alone. ?           Anticipated disposition: Home health with 24/7 supervision versus SNF.  TOC working on it. ? ?Mobility Assessment (last 72 hours)   ? ? Mobility Assessment   ? ? [place] Name 01/15/22 2050 01/15/22 1236 01/15/22 0948 01/14/22 2200 01/14/22 1534  ? Does patient have an order for bedrest or is patient medically unstable No - Continue assessment -- No - Continue assessment No - Continue assessment --  ? What is the highest level of mobility based on the progressive mobility assessment? Level 4 (Walks with assist in room) - Balance while marching in place and cannot step forward and back - Complete Level 4 (Walks with  assist in room) - Balance while marching in place and cannot step forward and back - Complete Level 5 (Walks with assist in room/hall) - Balance while stepping forward/back and can walk in room with assist - Complete Level 5 (Walks with assist in room/hall) - Balance while stepping forward/back and can walk in room with  assist - Complete Level 4 (Walks with assist in room) - Balance while marching in place and cannot step forward and back - Complete  ? Is the above level different from baseline mobility prior to current illness? Yes - Recommend PT order -- -- -- --  ? ? [place] Name 01/14/22 1445 01/14/22 1435 01/14/22 0614  ?  ?  ? Does patient have an order for bedrest or is patient medically unstable No - Continue assessment -- No - Continue assessment    ? What is the highest level of mobility based on the progressive mobility assessment? Level 4 (Walks with assist in room) - Balance while marching in place and cannot step forward and back - Complete Level 4 (Walks with assist in room) - Balance while marching in place and cannot step forward and back - Complete Level 3 (Stands with assist) - Balance while standing  and cannot march in place    ? Is the above level different from baseline mobility prior to current illness? Yes - Recommend PT order -- Yes - Recommend PT order    ? ?  ?  ? ?  ?  ? ?Objective: ?Vitals last 24 hrs: ?Vitals:  ? 01/15/22 2016 01/16/22 0323 01/16/22 0400 01/16/22 0700  ?BP: (!) 170/79 (!) 213/79 (!) 194/83 (!) 193/76  ?Pulse: 71 71 73   ?Resp: 16 20    ?Temp: 97.6 ?F (36.4 ?C) 97.8 ?F (36.6 ?C)    ?TempSrc: Oral Oral    ?SpO2: 97% 100%    ?Weight:      ?Height:      ? ?Weight change:  ? ?Physical Examination: ?General exam: AA0x3, pleasant, elderly older than stated age, weak appearing. ?HEENT:Oral mucosa moist, Ear/Nose WNL grossly, dentition normal. ?Respiratory system: bilaterally clear,no use of accessory muscle ?Cardiovascular system: S1 & S2 +, No JVD,. ?Gastrointestinal system: Abdomen soft,NT,ND, BS+ ?Nervous System:Alert, awake, moving extremities and grossly nonfocal ?Extremities: edema in LE ankle- improving, skin wrinkling+ ?Skin: No rashes,no icterus. ?MSK: Normal muscle bulk,tone, power ? ? ?Medications reviewed:  ?Scheduled Meds: ? aspirin  81 mg Oral Daily  ? atorvastatin  80 mg Oral q1800   ? brimonidine  1 drop Both Eyes BID  ? enoxaparin (LOVENOX) injection  30 mg Subcutaneous Q24H  ? famotidine  20 mg Oral Daily  ? furosemide  20 mg Oral Daily  ? Loteprednol Etabonate   Ophthalmic 6 X Daily  ? metoprolol tartrate  25 mg Oral BID  ? sodium chloride  1 application. Right Eye QHS  ? sodium chloride  1 drop Right Eye QID  ? sodium chloride flush  3 mL Intravenous Q12H  ? sodium zirconium cyclosilicate  5 g Oral Once  ? timolol  1 drop Both Eyes q morning  ? Vibegron  75 mg Oral Daily  ? ?Continuous Infusions: ? cefTRIAXone (ROCEPHIN)  IV 1 g (01/15/22 2152)  ? ? ?  ?Diet Order   ? ?       ?  Diet Heart Room service appropriate? Yes; Fluid consistency: Thin  Diet effective now       ?  ? ?  ?  ? ?  ?  ? ?  ?  ?  ? ? ?  Intake/Output Summary (Last 24 hours) at 01/16/2022 1023 ?Last data filed at 01/16/2022 [id number] ?Gross per 24 hour  ?Intake 580 ml  ?Output 550 ml  ?Net 30 ml  ? ? ?Net IO Since Admission: 688 mL [01/16/22 1023]  ?Wt Readings from Last 3 Encounters:  ?01/14/22 63.8 kg  ?06/22/21 60.8 kg  ?12/21/20 59 kg  ?  ? ?Unresulted Labs (From admission, onward)  ? ?  Start     Ordered  ? 01/21/22 0500  Creatinine, serum  (enoxaparin (LOVENOX)    CrCl < 30 ml/min)  Weekly,   R     ?Comments: while on enoxaparin therapy. ?  ? 01/14/22 0102  ? 01/16/22 [id number]  Basic metabolic panel  Daily,   R     ?Question:  Specimen collection method  Answer:  Lab=Lab collect  ? 01/15/22 1138  ? 01/16/22 0500  CBC  Daily,   R     ?Question:  Specimen collection method  Answer:  Lab=Lab collect  ? 01/15/22 1138  ? ?  ?  ? ?  ?Data Reviewed: I have personally reviewed following labs and imaging studies ?CBC: ?Recent Labs  ?Lab Feb 09, 2022 ?2237 Feb 09, 2022 ?2243 01/14/22 ?[id number] 01/15/22 ?1526 01/16/22 ?0453  ?WBC 16.5*  --  18.3* 12.0* 12.3*  ?HGB 12.7 13.9 13.2 10.1* 11.2*  ?HCT 40.9 41.0 41.7 33.4* 36.9  ?MCV 91.3  --  90.7 92.0 92.7  ?PLT 322  --  310 225 236  ? ? ?Basic Metabolic Panel: ?Recent Labs  ?Lab Feb 09, 2022 ?2237  Feb 09, 2022 ?2243 01/14/22 ?[id number] 01/16/22 ?0453  ?NA 139 138 139 136  ?K 4.9 4.8 4.6 5.3*  ?CL 112* 110 108 108  ?CO2 19*  --  19* 20*  ?GLUCOSE 129* 129* 140* 109*  ?BUN 49* 43* 47* 44*  ?CREATININE 1.42* 1.50* 1.44* 1.3

## 2022-01-16 NOTE — Plan of Care (Signed)
Labetolol given for elevated BP this shift. ?Problem: Education: ?Goal: Knowledge of General Education information will improve ?Description: Including pain rating scale, medication(s)/side effects and non-pharmacologic comfort measures ?Outcome: Progressing ?  ?Problem: Health Behavior/Discharge Planning: ?Goal: Ability to manage health-related needs will improve ?Outcome: Progressing ?  ?Problem: Clinical Measurements: ?Goal: Ability to maintain clinical measurements within normal limits will improve ?Outcome: Progressing ?Goal: Will remain free from infection ?Outcome: Progressing ?Goal: Diagnostic test results will improve ?Outcome: Progressing ?Goal: Respiratory complications will improve ?Outcome: Progressing ?Goal: Cardiovascular complication will be avoided ?Outcome: Progressing ?  ?Problem: Activity: ?Goal: Risk for activity intolerance will decrease ?Outcome: Progressing ?  ?Problem: Nutrition: ?Goal: Adequate nutrition will be maintained ?Outcome: Progressing ?  ?Problem: Coping: ?Goal: Level of anxiety will decrease ?Outcome: Progressing ?  ?Problem: Elimination: ?Goal: Will not experience complications related to bowel motility ?Outcome: Progressing ?Goal: Will not experience complications related to urinary retention ?Outcome: Progressing ?  ?Problem: Pain Managment: ?Goal: General experience of comfort will improve ?Outcome: Progressing ?  ?Problem: Safety: ?Goal: Ability to remain free from injury will improve ?Outcome: Progressing ?  ?Problem: Skin Integrity: ?Goal: Risk for impaired skin integrity will decrease ?Outcome: Progressing ?  ?Problem: Urinary Elimination: ?Goal: Signs and symptoms of infection will decrease ?Outcome: Progressing ?  ?

## 2022-01-17 ENCOUNTER — Other Ambulatory Visit (INDEPENDENT_AMBULATORY_CARE_PROVIDER_SITE_OTHER): Payer: Self-pay | Admitting: Ophthalmology

## 2022-01-17 ENCOUNTER — Other Ambulatory Visit (HOSPITAL_COMMUNITY): Payer: Self-pay

## 2022-01-17 LAB — CBC
HCT: 37.7 % (ref 36.0–46.0)
Hemoglobin: 11.3 g/dL — ABNORMAL LOW (ref 12.0–15.0)
MCH: 28 pg (ref 26.0–34.0)
MCHC: 30 g/dL (ref 30.0–36.0)
MCV: 93.3 fL (ref 80.0–100.0)
Platelets: 254 10*3/uL (ref 150–400)
RBC: 4.04 MIL/uL (ref 3.87–5.11)
RDW: 15.2 % (ref 11.5–15.5)
WBC: 13 10*3/uL — ABNORMAL HIGH (ref 4.0–10.5)
nRBC: 0 % (ref 0.0–0.2)

## 2022-01-17 LAB — BASIC METABOLIC PANEL
Anion gap: 6 (ref 5–15)
BUN: 48 mg/dL — ABNORMAL HIGH (ref 8–23)
CO2: 21 mmol/L — ABNORMAL LOW (ref 22–32)
Calcium: 8.6 mg/dL — ABNORMAL LOW (ref 8.9–10.3)
Chloride: 109 mmol/L (ref 98–111)
Creatinine, Ser: 1.2 mg/dL — ABNORMAL HIGH (ref 0.44–1.00)
GFR, Estimated: 41 mL/min — ABNORMAL LOW (ref >60)
Glucose, Bld: 124 mg/dL — ABNORMAL HIGH (ref 70–99)
Potassium: 5.1 mmol/L (ref 3.5–5.1)
Sodium: 136 mmol/L (ref 135–145)

## 2022-01-17 MED ORDER — FOSFOMYCIN TROMETHAMINE 3 G PO PACK
3.0000 g | PACK | Freq: Once | ORAL | 0 refills | Status: AC
Start: 2022-01-20 — End: 2022-01-20
  Filled 2022-01-17: qty 3, 1d supply, fill #0

## 2022-01-17 MED ORDER — AMLODIPINE BESYLATE 2.5 MG PO TABS
2.5000 mg | ORAL_TABLET | Freq: Every day | ORAL | 0 refills | Status: DC
  Filled 2022-01-17: qty 30, 30d supply, fill #0

## 2022-01-17 MED ORDER — AMLODIPINE BESYLATE 5 MG PO TABS
5.0000 mg | ORAL_TABLET | Freq: Every day | ORAL | Status: DC
  Administered 2022-01-17: 5 mg via ORAL
  Filled 2022-01-17: qty 1

## 2022-01-17 MED ORDER — FOSFOMYCIN TROMETHAMINE 3 G PO PACK
3.0000 g | PACK | Freq: Once | ORAL | Status: AC
  Administered 2022-01-17: 3 g via ORAL
  Filled 2022-01-17: qty 3

## 2022-01-17 NOTE — TOC Transition Note (Signed)
Transition of Care (TOC) - CM/SW Discharge Note ? ? ?Patient Details  ?Name: [name] ?MRN: [id number] ?Date of Birth: 1922/11/04 ? ?Transition of Care (TOC) CM/SW Contact:  ?Julieta Rogalski, LCSW ?Phone Number: ?01/17/2022, 3:25 PM ? ? ?Clinical Narrative:    ? ?Met with pt and daughter today who report pt doing much better and anticipating dc home today. Daughter confirms that she and her sister share in providing pt with 24/7 care.  They are agreeable with recommended HHPT/OT/aide and request [facility].  Able to secure coverage with [facility] this afternoon.   No further TOC needs. ? ?Final next level of care: [place] ?Barriers to Discharge: Barriers Resolved ? ? ?Patient Goals and CMS Choice ?Patient states their goals for this hospitalization and ongoing recovery are:: get stronger ?CMS Medicare.gov Compare Post Acute Care list provided to:: Patient ?Choice offered to / list presented to : Patient, Adult Children ? ?Discharge Placement ?  ?           ?  ?  ?  ?  ? ?Discharge Plan and Services ?  ?Discharge Planning Services: CM Consult ?Post Acute Care Choice: [place]          ?DME Arranged: N/A ?DME Agency: NA ?  ?  ?  ?HH Arranged: PT, OT, Nurse's Aide ?[place] Agency: [place] ?Date HH Agency Contacted: 01/17/22 ?Time [place]: 1115 ?Representative spoke with at [place]: Malachy Mood ? ?Social Determinants of Health (SDOH) Interventions ?  ? ? ?Readmission Risk Interventions ? ?  01/17/2022  ?  3:24 PM  ?Readmission Risk Prevention Plan  ?Transportation Screening Complete  ?PCP or Specialist Appt within 3-5 Days Complete  ?Palliative Care Screening Not Applicable  ? ? ? ? ? ?

## 2022-01-17 NOTE — Progress Notes (Signed)
Went over discharge instructions w/ pt and pt daughter. They verbalized understanding ? ?

## 2022-01-17 NOTE — Discharge Summary (Signed)
Physician Discharge Summary  ?[name] [id number] DOB: 08/25/1923 DOA: 01/13/2022 ? ?PCP: Paula Morning, DO ? ?Admit date: 01/13/2022 ?Discharge date: 01/17/2022 ?Recommendations for Outpatient Follow-up:  ?Follow up with PCP in 1 weeks-call for appointment ?Please obtain BMP/CBC in one week ? ?Discharge Dispo: home w [facility] ?Discharge Condition: Stable ?Code Status:   Code Status: DNR ?Diet recommendation:  ?Diet Order   ? ?       ?  Diet Heart Room service appropriate? Yes; Fluid consistency: Thin  Diet effective now       ?  ? ?  ?  ? ?  ?  ? ?Brief/Interim Summary: ?88 y.o. female with medical history significant of stroke, hypertension, recurrent UTI, CKD 3, CAD, HLD,diastolic CHF, GERD, gout, macular degeneration, glaucoma presenting with weakness , increased urinary frequency and incontinence. While trying to stand family noticed patient to be weak , felt very weak in both legs,fell,was unable to support herself. Typically she is able to walk at baseline with assistance of a walker.  Patient had similar presentation from UTI in the past. ?In the ED work-up showed creatinine 1.4 leukocytosis UA with WBC 0-5 RBC more than 50 LE small. ?CT head without acute abnormality.  CT renal stone study showing findings consistent with cystitis.  Patient received hydralazine and ceftriaxone in the ED and was admitted for further management  ?Klebsiella turned out to be ESBL, patient has not been clinically improving ?Patient was admitted x1 and repeat in 3 days.  Has been doing well with his multivitamin, afebrile leukocytosis downtrending ?Discharge Diagnoses:  ?Principal Problem: ?  UTI (urinary tract infection) ?Active Problems: ?  History of CVA (cerebrovascular accident) ?  Essential hypertension ?  Hyperkalemia ?  CKD (chronic kidney disease), stage III ([place]) ?  Coronary artery disease ?  Hyperlipidemia ?  Chronic diastolic heart failure ([place]) ?  Gastroesophageal reflux disease ?  Glaucoma ?  Macular degeneration ?   Cystitis ? ?ESBL klebsiella UTI/cystitis ?History of previous UTI ?Generalized weakness/deconditioning in the setting of UTI: ?50,000 colonies Klebsiella ESBL positive-FOSFOMYCIN given 4/10-can repeat in 3 days.Follow-up with urology in a week.  Leukocytosis that has been downtrending.  She has been on chronic Keflex but still discussed with urology regarding further antibiotics ? ?CKD stage IIIb: baseline creatinine close to 1.4, remains stable. ?  ?Hyperkalemia: Likely from CKD.  Improved after Lokelma.  Follow-up BMP in 1 week ?  ?Chronic diastolic CHF ?Chronic leg edema: ?G1 DD EF 65 to 70% in 2016.  Does have leg edema which is improving.  Continue home Lasix.  Elevate legs. compression stocking as tolerated.  Follow-up with her cardiology outpatient. ? ?CAD ?Hypertension  ?HLD: ?with poorly controlled blood pressure in the ED and during hospitalization blood pressure has been fluctuating orthostatic vitals negative.  Give dose of amlodipine 5 mg blood pressure remained stable discussed about monitoring blood pressure at home and holding amlodipine blood pressure less than 110 if significant drop of more than 40-continues to follow-up with her primary cardiologist, previously on losartan discontinued due to hyperkalemia she is on home metoprolol along with aspirin, Lipitor. ?  ?History of CVA: ?continue her aspirin Lipitor. ?  ?Macular degeneration ?Glaucoma: ?Continue eyedrops. ? ?Consults: ?none ?Subjective: ?Alert oriented resting comfortably feels much better ?Daughter at the bedside ? ?Discharge Exam: ?Vitals:  ? 01/17/22 1301 01/17/22 1437  ?BP: 124/65 137/65  ?Pulse: 66 65  ?Resp: 20 20  ?Temp: 98.6 ?F (37 ?C) 97.9 ?F (36.6 ?C)  ?SpO2: 96% 98%  ? ?  General: Pt is alert, awake, not in acute distress ?Cardiovascular: RRR, S1/S2 +, no rubs, no gallops ?Respiratory: CTA bilaterally, no wheezing, no rhonchi ?Abdominal: Soft, NT, ND, bowel sounds + ?Extremities: no edema, no cyanosis ? ?Discharge  Instructions ? ?Discharge Instructions   ? ? Discharge instructions   Complete by: As directed ?  ? Follow-up with urology Dr. Within a week ?Follow-up with primary care doctor within a week ? ?Please call call MD or return to ER for similar or worsening recurring problem that brought you to hospital or if any fever,nausea/vomiting,abdominal pain, uncontrolled pain, chest pain,  shortness of breath or any other alarming symptoms. ? ?Please follow-up your doctor as instructed in a week time and call the office for appointment. ? ?Please avoid alcohol, smoking, or any other illicit substance and maintain healthy habits including taking your regular medications as prescribed. ? ?You were cared for by a hospitalist during your hospital stay. If you have any questions about your discharge medications or the care you received while you were in the hospital after you are discharged, you can call the unit and ask to speak with the hospitalist on call if the hospitalist that took care of you is not available. ? ?Once you are discharged, your primary care physician will handle any further medical issues. Please note that NO REFILLS for any discharge medications will be authorized once you are discharged, as it is imperative that you return to your primary care physician (or establish a relationship with a primary care physician if you do not have one) for your aftercare needs so that they can reassess your need for medications and monitor your lab values  ? Increase activity slowly   Complete by: As directed ?  ? No wound care   Complete by: As directed ?  ? ?  ? ?Allergies as of 01/17/2022   ? ?   Reactions  ? Meloxicam Hives  ? Nitrofurantoin   ? Other Other (See Comments)  ? Guaifenesin Er Rash  ? Other reaction(s): rash  ? Nitrofuran Derivatives Rash  ? ?  ? ?  ?Medication List  ?  ? ?STOP taking these medications   ? ?cephALEXin 250 MG capsule ?Commonly known as: KEFLEX ?  ? ?  ? ?TAKE these medications   ? ?Acetaminophen  325 MG Caps ?Take 325 mg by mouth 3 (three) times daily as needed. ?  ?amLODipine 2.5 MG tablet ?Commonly known as: NORVASC ?Take 1 tablet (2.5 mg total) by mouth daily. ?  ?aspirin 81 MG chewable tablet ?Chew 81 mg by mouth daily. ?  ?atorvastatin 80 MG tablet ?Commonly known as: LIPITOR ?Take 1 tablet (80 mg total) by mouth daily at 6 PM. ?  ?brimonidine 0.2 % ophthalmic solution ?Commonly known as: ALPHAGAN ?Place 1 drop into both eyes in the [name] and at bedtime. ?  ?CALTRATE 600+D PO ?Take 1 tablet by mouth daily. ?  ?chlorpheniramine 4 MG tablet ?Commonly known as: CHLOR-TRIMETON ?Take 4 mg by mouth daily. ?  ?famotidine 20 MG tablet ?Commonly known as: PEPCID ?Take 20 mg by mouth daily. ?  ?Fluzone High-Dose Quadrivalent 0.7 ML Susy ?Generic drug: Influenza Vac High-Dose Quad ?  ?fosfomycin 3 g Pack ?Commonly known as: MONUROL ?Take 3 g by mouth once for 1 dose. Take on 01/20/22 once ?Start taking on: January 20, 2022 ?  ?furosemide 40 MG tablet ?Commonly known as: LASIX ?Take 20 mg by mouth daily. ?What changed: Another medication with the same name was removed.  Continue taking this medication, and follow the directions you see here. ?  ?Gemtesa 75 MG Tabs ?Generic drug: Vibegron ?Take 75 mg by mouth daily. ?  ?gentamicin cream 0.1 % ?Commonly known as: GARAMYCIN ?Apply 1 application topically 2 (two) times daily. ?  ?Lotemax SM 0.38 % Gel ?Generic drug: Loteprednol Etabonate ?INSTILL 1 DROP SIX TIMES A DAY IN RIGHT EYE. ?What changed: See the new instructions. ?  ?metoprolol tartrate 50 MG tablet ?Commonly known as: LOPRESSOR ?Take 25 mg by mouth 2 (two) times daily. ?  ?Pfizer-BioNT COVID-19 Vac-TriS Susp injection ?Generic drug: COVID-19 mRNA Vac-TriS [employer]) ?  ?Prolia 60 MG/ML Sosy injection ?Generic drug: denosumab ?60 mg See admin instructions. ?  ?sodium chloride 5 % ophthalmic ointment ?Commonly known as: MURO 128 ?Place 1 application into the right eye at bedtime. ?What changed: Another medication  with the same name was changed. Make sure you understand how and when to take each. ?  ?sodium chloride 5 % ophthalmic solution ?Commonly known as: MURO 128 ?INSTILL 1 DROP INTO RIGHT EYE 4 TIMES A DAY ?What changed:

## 2022-01-17 NOTE — Plan of Care (Signed)
No acute events this shift. ?Problem: Education: ?Goal: Knowledge of General Education information will improve ?Description: Including pain rating scale, medication(s)/side effects and non-pharmacologic comfort measures ?Outcome: Progressing ?  ?Problem: Health Behavior/Discharge Planning: ?Goal: Ability to manage health-related needs will improve ?Outcome: Progressing ?  ?Problem: Clinical Measurements: ?Goal: Ability to maintain clinical measurements within normal limits will improve ?Outcome: Progressing ?Goal: Will remain free from infection ?Outcome: Progressing ?Goal: Diagnostic test results will improve ?Outcome: Progressing ?Goal: Respiratory complications will improve ?Outcome: Progressing ?Goal: Cardiovascular complication will be avoided ?Outcome: Progressing ?  ?Problem: Activity: ?Goal: Risk for activity intolerance will decrease ?Outcome: Progressing ?  ?Problem: Nutrition: ?Goal: Adequate nutrition will be maintained ?Outcome: Progressing ?  ?Problem: Coping: ?Goal: Level of anxiety will decrease ?Outcome: Progressing ?  ?Problem: Elimination: ?Goal: Will not experience complications related to bowel motility ?Outcome: Progressing ?Goal: Will not experience complications related to urinary retention ?Outcome: Progressing ?  ?Problem: Pain Managment: ?Goal: General experience of comfort will improve ?Outcome: Progressing ?  ?Problem: Safety: ?Goal: Ability to remain free from injury will improve ?Outcome: Progressing ?  ?Problem: Skin Integrity: ?Goal: Risk for impaired skin integrity will decrease ?Outcome: Progressing ?  ?Problem: Urinary Elimination: ?Goal: Signs and symptoms of infection will decrease ?Outcome: Progressing ?  ?

## 2022-01-17 NOTE — Progress Notes (Signed)
Occupational Therapy Treatment ?Patient Details ?Name: [name] ?MRN: [id number] ?DOB: 1923-05-08 ?Today's Date: 01/17/2022 ? ? ?History of present illness 88 y.o. female with medical history significant of stroke, hypertension, recurrent UTI, CKD 3, CAD, HLD,diastolic CHF, GERD, gout, macular degeneration, glaucoma presenting with weakness , increased urinary frequency and incontinence. While trying to stand family noticed patient to be weak , felt very weak in both legs,fell,was unable to support herself. Dx of UTI. ?  ?OT comments ? Patient was able to participate in grooming tasks with occasional one UE support with SUP with cues for proper hand placement to transition into functional mobility. Patient continues to need min A for transfers on and off commode with use of grab bar. Patients daughter reported that they have handles on commode with higher toilet seat at home. Patient continued to be recommended to have 24/7 caregiver sup and physical assistance at home. Patient would continue to benefit from skilled OT services at this time while admitted and after d/c to address noted deficits in order to improve overall safety and independence in ADLs.  ?  ? ?Recommendations for follow up therapy are one component of a multi-disciplinary discharge planning process, led by the attending physician.  Recommendations may be updated based on patient status, additional functional criteria and insurance authorization. ?   ?Follow Up Recommendations ? Skilled nursing-short term rehab (<3 hours/day)  ?  ?Assistance Recommended at Discharge Frequent or constant Supervision/Assistance  ?Patient can return home with the following ? A little help with walking and/or transfers;A lot of help with bathing/dressing/bathroom;Assistance with cooking/housework;Help with stairs or ramp for entrance ?  ?Equipment Recommendations ? None recommended by OT  ?  ?Recommendations for Other Services   ? ?  ?Precautions / Restrictions  Precautions ?Precautions: Fall ?Precaution Comments: 3-4 falls in past 6 months, R knee feels like it could buckle per patient ?Restrictions ?Weight Bearing Restrictions: No  ? ? ?  ? ?Mobility Bed Mobility ?Overal bed mobility: Needs Assistance ?Bed Mobility: Sit to Supine ?  ?  ?  ?Sit to supine: Min assist ?  ?General bed mobility comments: physical assist to get BLE into bed. ?  ? ?Transfers ?  ?  ?  ?  ?  ?  ?  ?  ?  ?  ?  ?  ?Balance Overall balance assessment: Needs assistance ?Sitting-balance support: No upper extremity supported, Feet supported ?Sitting balance-Leahy Scale: Good ?  ?  ?Standing balance support: Single extremity supported, During functional activity ?Standing balance-Leahy Scale: Fair ?Standing balance comment: able to take one hand off of device for ADLs ?  ?  ?  ?  ?  ?  ?  ?  ?  ?  ?  ?   ? ?ADL either performed or assessed with clinical judgement  ? ?ADL Overall ADL's : Needs assistance/impaired ?  ?  ?Grooming: Oral care;Wash/dry hands;Standing;Supervision/safety;Brushing hair ?  ?  ?  ?  ?  ?  ?  ?  ?  ?Toilet Transfer: Minimal assistance;Rolling walker (2 wheels) ?Toilet Transfer Details (indicate cue type and reason): MIn assist to power up from toilet  with use of grab bars and cues to keep Rw with her. proper hand placement. ?Toileting- Water quality scientist and Hygiene: Min guard;Sitting/lateral lean ?Toileting - Clothing Manipulation Details (indicate cue type and reason): able to manage cleaning and clothing ?  ?  ?  ?General ADL Comments: patients daughter inquired about if patient needed bed rail. patient had one in past  and is still needing physical assist to get in and out of bed. patients daughter was educated on how one would help to increase patients independence in ability to engage in bed mobility. ?  ? ?Extremity/Trunk Assessment   ?  ?  ?  ?  ?  ? ?Vision   ?  ?  ?Perception   ?  ?Praxis   ?  ? ?Cognition Arousal/Alertness: Awake/alert ?Behavior During Therapy: [facility]  for tasks assessed/performed ?Overall Cognitive Status: Within Functional Limits for tasks assessed ?  ?  ?  ?  ?  ?  ?  ?  ?  ?  ?  ?  ?  ?  ?  ?  ?General Comments: HOH, daughter present as well. ?  ?  ?   ?Exercises   ? ?  ?Shoulder Instructions   ? ? ?  ?General Comments    ? ? ?Pertinent Vitals/ Pain       Pain Assessment ?Pain Assessment: Faces ?Faces Pain Scale: Hurts a little bit ?Pain Location: Knee and R shoulder ?Pain Descriptors / Indicators: Grimacing, Aching ?Pain Intervention(s): Monitored during session, Limited activity within patient's tolerance ? ?Home Living   ?  ?  ?  ?  ?  ?  ?  ?  ?  ?  ?  ?  ?  ?  ?  ?  ?  ?  ? ?  ?Prior Functioning/Environment    ?  ?  ?  ?   ? ?Frequency ? Min 2X/week  ? ? ? ? ?  ?Progress Toward Goals ? ?OT Goals(current goals can now be found in the care plan section) ? Progress towards OT goals: Progressing toward goals ? ?   ?Plan Discharge plan needs to be updated   ? ?Co-evaluation ? ? ?   ?  ?  ?  ?  ? ?  ?AM-PAC OT "6 Clicks" Daily Activity     ?Outcome Measure ? ? Help from another person eating meals?: A Little ?Help from another person taking care of personal grooming?: A Little ?Help from another person toileting, which includes using toliet, bedpan, or urinal?: A Lot ?Help from another person bathing (including washing, rinsing, drying)?: A Little ?Help from another person to put on and taking off regular upper body clothing?: A Little ?Help from another person to put on and taking off regular lower body clothing?: A Lot ?6 Click Score: 16 ? ?  ?End of Session Equipment Utilized During Treatment: Rolling walker (2 wheels) ? ?OT Visit Diagnosis: Muscle weakness (generalized) (M62.81) ?  ?Activity Tolerance Patient tolerated treatment well ?  ?Patient Left in bed;with call bell/phone within reach;with family/visitor present ?  ?Nurse Communication Other (comment) (time in bed for nurse to complete orthostatics pre nurse request) ?  ? ?   ? ?Time: [id number] ?OT  Time Calculation (min): 18 min ? ?Charges: OT General Charges ?$OT Visit: 1 Visit ?OT Treatments ?$Self Care/Home Management : 8-22 mins ? ?Beretta Ginsberg OTR/L, MS ?Acute Rehabilitation Department ?Office# [phone] ?Pager# [phone] ? ? ?[place] ?01/17/2022, 10:47 AM ?

## 2022-01-17 NOTE — Progress Notes (Signed)
Physical Therapy Treatment ?Patient Details ?Name: [name] ?MRN: [id number] ?DOB: 1923-09-03 ?Today's Date: 01/17/2022 ? ? ?History of Present Illness 88 y.o. female with medical history significant of stroke, hypertension, recurrent UTI, CKD 3, CAD, HLD,diastolic CHF, GERD, gout, macular degeneration, glaucoma presenting with weakness , increased urinary frequency and incontinence. While trying to stand family noticed patient to be weak , felt very weak in both legs,fell,was unable to support herself. Dx of UTI. ? ?  ?PT Comments  ? ? Patient making steady progress with mobility and completed transfers and gait with Min assist and RW. Multimodal cues needed to facilitate safe sequencing and maintain balance throughout. Pt ambulated to bathroom and provided seated rest on BSC to complete sink bath. Pt's daughter present and assisted with ~75-100% of sink bath for pt. EOS pt ambulated back to recliner and repositioned for comfort. HR stable 60's-70's throughout session. Pt's family able to provide 24/7 assist and comfortable providing physical assist level. Acute PT will continue to progress pt as able. ? ?  ?Recommendations for follow up therapy are one component of a multi-disciplinary discharge planning process, led by the attending physician.  Recommendations may be updated based on patient status, additional functional criteria and insurance authorization. ? ?Follow Up Recommendations ? Home health PT ?  ?  ?Assistance Recommended at Discharge Frequent or constant Supervision/Assistance  ?Patient can return home with the following A little help with walking and/or transfers;A little help with bathing/dressing/bathroom;Assistance with cooking/housework;Assist for transportation;Help with stairs or ramp for entrance ?  ?Equipment Recommendations ? None recommended by PT  ?  ?Recommendations for Other Services   ? ? ?  ?Precautions / Restrictions Precautions ?Precautions: Fall ?Precaution Comments: 3-4 falls in  past 6 months, R knee feels like it could buckle per patient ?Restrictions ?Weight Bearing Restrictions: No  ?  ? ?Mobility ? Bed Mobility ?  ?  ?  ?  ?  ?  ?  ?General bed mobility comments: OOB in recliner ?  ? ?Transfers ?Overall transfer level: Needs assistance ?Equipment used: Rolling walker (2 wheels) ?Transfers: Sit to/from Stand, Bed to chair/wheelchair/BSC ?Sit to Stand: Min assist ?  ?  ?  ?  ?  ?General transfer comment: verbal/tactile cues for safe hand placement/technique to rise from recliner, BSC, and toilet. Min assist to fully power up and steady in standing. ?  ? ?Ambulation/Gait ?Ambulation/Gait assistance: Min assist ?Gait Distance (Feet): 40 Feet (2x20) ?Assistive device: Rolling walker (2 wheels) ?Gait Pattern/deviations: Step-through pattern, Decreased stride length, Trunk flexed, Shuffle ?Gait velocity: decr ?  ?  ?General Gait Details: slow pattern and decreased hip elevation and limited dorsiflexion leading to low foot clearance. slight posterior lean and min tactile cues at back to facilitate forward weight shift. min assist to steady throughout turns in bathroom to prevent LOB and guide direction and walker. ? ? ?Stairs ?  ?  ?  ?  ?  ? ? ?Wheelchair Mobility ?  ? ?Modified Rankin (Stroke Patients Only) ?  ? ? ?  ?Balance Overall balance assessment: Needs assistance ?Sitting-balance support: No upper extremity supported, Feet supported ?Sitting balance-Leahy Scale: Good ?  ?  ?Standing balance support: Bilateral upper extremity supported, During functional activity ?Standing balance-Leahy Scale: Poor ?  ?  ?  ?  ?  ?  ?  ?  ?  ?  ?  ?  ?  ? ?  ?Cognition Arousal/Alertness: Awake/alert ?Behavior During Therapy: [facility] for tasks assessed/performed ?Overall Cognitive Status: Within Functional Limits  for tasks assessed ?  ?  ?  ?  ?  ?  ?  ?  ?  ?  ?  ?  ?  ?  ?  ?  ?General Comments: HOH, daughter present as well. ?  ?  ? ?  ?Exercises   ? ?  ?General Comments   ?  ?  ? ?Pertinent Vitals/Pain  Pain Assessment ?Pain Assessment: No/denies pain ?Pain Intervention(s): Monitored during session, Repositioned  ? ? ?Home Living   ?  ?  ?  ?  ?  ?  ?  ?  ?  ?   ?  ?Prior Function    ?  ?  ?   ? ?PT Goals (current goals can now be found in the care plan section) Acute Rehab PT Goals ?PT Goal Formulation: With patient ?Time For Goal Achievement: 01/28/22 ?Potential to Achieve Goals: Good ?Progress towards PT goals: Progressing toward goals ? ?  ?Frequency ? ? ? Min 3X/week ? ? ? ?  ?PT Plan Current plan remains appropriate  ? ? ?Co-evaluation   ?  ?  ?  ?  ? ?  ?AM-PAC PT "6 Clicks" Mobility   ?Outcome Measure ? Help needed turning from your back to your side while in a flat bed without using bedrails?: A Little ?Help needed moving from lying on your back to sitting on the side of a flat bed without using bedrails?: A Little ?Help needed moving to and from a bed to a chair (including a wheelchair)?: A Little ?Help needed standing up from a chair using your arms (e.g., wheelchair or bedside chair)?: A Little ?Help needed to walk in hospital room?: A Little ?Help needed climbing 3-5 steps with a railing? : A Lot ?6 Click Score: 17 ? ?  ?End of Session Equipment Utilized During Treatment: Gait belt ?Activity Tolerance: Patient tolerated treatment well ?Patient left: in chair;with call bell/phone within reach;with family/visitor present ?Nurse Communication: Mobility status ?PT Visit Diagnosis: Difficulty in walking, not elsewhere classified (R26.2);History of falling (Z91.81) ?  ? ? ?Time: [id number] ?PT Time Calculation (min) (ACUTE ONLY): 32 min ? ?Charges:  $Gait Training: 8-22 mins ?$Therapeutic Activity: 8-22 mins          ?          ? ?[name] PT, DPT ?Acute Rehabilitation Services ?Office [phone] ?Pager [phone]  ? ? ?[name] ?01/17/2022, 3:19 PM ? ?

## 2022-01-17 NOTE — Consult Note (Signed)
[facility] CM Inpatient Consult ? ? ?01/17/2022 ? ?[name] ?05/21/1923 ?[id number] ? ?[facility] Customer service manager Care Management [facility] CM) ?  ?Patient chart reviewed with noted high risk score for unplanned readmission. Assessed for post hospital chronic disease management needs with [facility] CM.  ? ?Per review, current recommendation is for skilled nursing facility. ? ?Plan: Will continue to follow for progression and disposition.  ? ?Of note, [facility] Care Management services does not replace or interfere with any services that are arranged by inpatient case management or social work.  ? ?Christophe Louis, MSN, RN ?[facility] [employer] Liaison ?Phone [phone] ?Toll free office [phone]  ? ?

## 2022-01-17 NOTE — Care Management Important Message (Signed)
Important Message ? ?Patient Details IM Letter placed in Patients room. ?Name: [name] ?MRN: [id number] ?Date of Birth: 06/10/1923 ? ? ?Medicare Important Message Given:  Yes ? ? ? ? ?Caren Macadam ?01/17/2022, 11:18 AM ?

## 2022-01-18 ENCOUNTER — Other Ambulatory Visit (HOSPITAL_COMMUNITY): Payer: Self-pay

## 2022-01-18 ENCOUNTER — Encounter: Payer: Self-pay | Admitting: Cardiology

## 2022-01-18 MED ORDER — AMLODIPINE BESYLATE 2.5 MG PO TABS: 2.5000 mg | ORAL_TABLET | Freq: Every day | ORAL | 3 refills | Status: DC

## 2022-01-22 ENCOUNTER — Emergency Department (HOSPITAL_COMMUNITY)
Admission: EM | Admit: 2022-01-22 | Discharge: 2022-01-22 | Disposition: A | Discharge status: Home / Self Care | Payer: Medicare Other | Attending: Emergency Medicine | Admitting: Emergency Medicine

## 2022-01-22 ENCOUNTER — Other Ambulatory Visit (HOSPITAL_COMMUNITY): Payer: Self-pay

## 2022-01-22 ENCOUNTER — Emergency Department (HOSPITAL_COMMUNITY): Payer: Medicare Other

## 2022-01-22 ENCOUNTER — Encounter (HOSPITAL_COMMUNITY): Payer: Self-pay | Admitting: *Deleted

## 2022-01-22 ENCOUNTER — Other Ambulatory Visit: Payer: Self-pay

## 2022-01-22 DIAGNOSIS — S0990XA Unspecified injury of head, initial encounter: Secondary | ICD-10-CM | POA: Diagnosis not present

## 2022-01-22 DIAGNOSIS — I10 Essential (primary) hypertension: Secondary | ICD-10-CM | POA: Diagnosis not present

## 2022-01-22 DIAGNOSIS — W01111A Fall on same level from slipping, tripping and stumbling with subsequent striking against power tool or machine, initial encounter: Secondary | ICD-10-CM | POA: Diagnosis not present

## 2022-01-22 DIAGNOSIS — S0181XA Laceration without foreign body of other part of head, initial encounter: Secondary | ICD-10-CM | POA: Diagnosis not present

## 2022-01-22 DIAGNOSIS — S0993XA Unspecified injury of face, initial encounter: Secondary | ICD-10-CM | POA: Diagnosis present

## 2022-01-22 DIAGNOSIS — M25511 Pain in right shoulder: Secondary | ICD-10-CM | POA: Diagnosis not present

## 2022-01-22 DIAGNOSIS — W19XXXA Unspecified fall, initial encounter: Secondary | ICD-10-CM

## 2022-01-22 NOTE — ED Provider Notes (Signed)
?[facility]-EMERGENCY DEPT ?Provider Note ? ? ?CSN: [id number] ?Arrival date & time: 01/22/22  1041 ? ?  ? ?History ? ?Chief Complaint  ?Patient presents with  ? Fall  ? Facial Injury  ? ? ?[name] is a 88 y.o. female.  Patient presents with injuries to the face and left arm secondary to a mechanical fall.  Patient's daughter states that the patient's legs got tangled and that she fell, hitting her left eye on the base of a fan.  The patient also had a skin tear on the left forearm in addition to a previous skin tear already bandaged on the left forearm. The patient complains of bruising around the left eye with a small skin tear.  The patient also complains of right shoulder pain. The patient was admitted to the hospital last week due to generalized weakness. The patient does not take blood thinners.  Other history significant for history of CVA, anemia, hypertension ? ?HPI ? ?  ? ?Home Medications ?Prior to Admission medications   ?Medication Sig Start Date End Date Taking? Authorizing Provider  ?Acetaminophen 325 MG CAPS Take 325 mg by mouth 3 (three) times daily as needed.    [provider]  ?amLODipine (NORVASC) 2.5 MG tablet Take 1 tablet (2.5 mg total) by mouth daily. 01/18/22 02/17/22  Lewayne Buntingrenshaw, Brian S, MD  ?aspirin 81 MG chewable tablet Chew 81 mg by mouth daily.    [provider]  ?atorvastatin (LIPITOR) 80 MG tablet Take 1 tablet (80 mg total) by mouth daily at 6 PM. 08/20/15   Ghimire, Werner LeanShanker M, MD  ?brimonidine (ALPHAGAN) 0.2 % ophthalmic solution Place 1 drop into both eyes in the morning and at bedtime.    [provider]  ?Calcium Carbonate-Vitamin D (CALTRATE 600+D PO) Take 1 tablet by mouth daily.    [provider]  ?chlorpheniramine (CHLOR-TRIMETON) 4 MG tablet Take 4 mg by mouth daily.    [provider]  ?denosumab (PROLIA) 60 MG/ML SOSY injection 60 mg See admin instructions. 12/18/17   [provider]  ?famotidine  (PEPCID) 20 MG tablet Take 20 mg by mouth daily.    [provider]  ?FLUZONE HIGH-DOSE QUADRIVALENT 0.7 ML SUSY  07/14/19   [provider]  ?furosemide (LASIX) 40 MG tablet Take 20 mg by mouth daily. 12/24/21   [provider]  ?GEMTESA 75 MG TABS Take 75 mg by mouth daily. 10/15/21   [provider]  ?gentamicin cream (GARAMYCIN) 0.1 % Apply 1 application topically 2 (two) times daily. ?Patient not taking: Reported on 01/14/2022 11/29/21   Felecia ShellingEvans, Brent M, DPM  ?LOTEMAX SM 0.38 % GEL INSTILL 1 DROP SIX TIMES A DAY IN RIGHT EYE. ?Patient taking differently: Place 1 drop into the right eye See admin instructions. Instill 1 drop six times a day in right eye. 11/07/21   Rennis ChrisZamora, Brian, MD  ?metoprolol (LOPRESSOR) 50 MG tablet Take 25 mg by mouth 2 (two) times daily.    [provider]  ?PFIZER-BIONT COVID-19 VAC-TRIS SUSP injection  05/08/21   [provider]  ?Polyethyl Glycol-Propyl Glycol (SYSTANE OP) Apply 1 drop to eye 2 (two) times daily as needed (dry eyes).    [provider]  ?sodium chloride (MURO 128) 5 % ophthalmic ointment Place 1 application into the right eye at bedtime. 06/04/21   Rennis ChrisZamora, Brian, MD  ?sodium chloride (MURO 128) 5 % ophthalmic solution INSTILL 1 DROP INTO RIGHT EYE 4 TIMES A DAY 01/18/22  Rennis Chris, MD  ?timolol (TIMOPTIC) 0.5 % ophthalmic solution Place 1 drop into both eyes every morning. 11/01/21   [provider]  ?   ? ?Allergies    ?Meloxicam, Nitrofurantoin, Other, Guaifenesin er, and Nitrofuran derivatives   ? ?Review of Systems   ?Review of Systems  ?Respiratory:  Negative for shortness of breath.   ?Cardiovascular:  Negative for chest pain.  ?Gastrointestinal:  Negative for abdominal pain.  ?Musculoskeletal:  Positive for arthralgias.  ?Skin:  Positive for wound.  ? ?Physical Exam ?Updated Vital Signs ?BP (!) 164/62   Pulse 67   Temp 97.8 ?F (36.6 ?C) (Oral)   Resp 16   Ht 5' (1.524 m)   Wt 59.9 kg   SpO2  100%   BMI 25.78 kg/m?  ?Physical Exam ?Vitals and nursing note reviewed.  ?Constitutional:   ?   General: She is not in acute distress. ?HENT:  ?   Head: Normocephalic. Contusion present.  ? ?   Comments: Contusions under left eye ?   Right Ear: External ear normal.  ?   Left Ear: External ear normal.  ?   Nose: Nose normal.  ?   Mouth/Throat:  ?   Mouth: Mucous membranes are moist.  ?Eyes:  ?   Conjunctiva/sclera: Conjunctivae normal.  ?Cardiovascular:  ?   Rate and Rhythm: Normal rate and regular rhythm.  ?   Pulses: Normal pulses.  ?Pulmonary:  ?   Effort: Pulmonary effort is normal.  ?   Breath sounds: Normal breath sounds.  ?Musculoskeletal:  ?   Cervical back: Normal range of motion.  ?   Comments: Patient complains of pain in right shoulder with active movement  ?Skin: ?   General: Skin is warm and dry.  ? ?    ?Neurological:  ?   Mental Status: She is alert. Mental status is at baseline.  ? ? ?ED Results / Procedures / Treatments   ?Labs ?(all labs ordered are listed, but only abnormal results are displayed) ?Labs Reviewed - No data to display ? ?EKG ?None ? ?Radiology ?DG Shoulder Right ? ?Result Date: 01/22/2022 ?CLINICAL DATA:  Anterior shoulder pain after fall this morning. EXAM: RIGHT SHOULDER - 2+ VIEW COMPARISON:  None. FINDINGS: No fracture or bone lesion. Glenohumeral and AC joints are normally spaced and aligned. Soft tissues are unremarkable. IMPRESSION: No fracture or dislocation. Electronically Signed   By: Amie Portland M.D.   On: 01/22/2022 15:18  ? ?CT Head Wo Contrast ? ?Result Date: 01/22/2022 ?CLINICAL DATA:  Fall, head trauma EXAM: CT HEAD WITHOUT CONTRAST TECHNIQUE: Contiguous axial images were obtained from the base of the skull through the vertex without intravenous contrast. RADIATION DOSE REDUCTION: This exam was performed according to the departmental dose-optimization program which includes automated exposure control, adjustment of the mA and/or kV according to patient size and/or  use of iterative reconstruction technique. COMPARISON:  01/13/2022 FINDINGS: Brain: No evidence of acute infarction, hemorrhage, hydrocephalus, extra-axial collection or mass lesion/mass effect. Periventricular and deep white matter hypodensity. Vascular: No hyperdense vessel or unexpected calcification. Skull: Normal. Negative for fracture or focal lesion. Sinuses/Orbits: No acute finding. Other: None. IMPRESSION: No acute intracranial pathology. Small-vessel white matter disease in keeping with advanced patient age. Electronically Signed   By: Jearld Lesch M.D.   On: 01/22/2022 11:41   ? ?Procedures ?Marland Kitchen.Laceration Repair ? ?Date/Time: 01/22/2022 3:30 PM ?Performed by: Darrick Grinder, PA-C ?Authorized by: Darrick Grinder, PA-C  ? ?Consent:  ?  Consent obtained:  Verbal ?  Consent given by:  Patient ?  Risks discussed:  Infection, need for additional repair, pain, poor cosmetic result and poor wound healing ?  Alternatives discussed:  No treatment and delayed treatment ?Universal protocol:  ?  Procedure explained and questions answered to patient or proxy's satisfaction: yes   ?  Relevant documents present and verified: yes   ?  Test results available: yes   ?  Imaging studies available: yes   ?  Required blood products, implants, devices, and special equipment available: yes   ?  Site/side marked: yes   ?  Immediately prior to procedure, a time out was called: yes   ?  Patient identity confirmed:  Verbally with patient ?Anesthesia:  ?  Anesthesia method:  None ?Laceration details:  ?  Location:  Shoulder/arm ?  Shoulder/arm location:  L lower arm ?Treatment:  ?  Area cleansed with:  Shur-Clens ?  Amount of cleaning:  Standard ?Skin repair:  ?  Repair method:  Steri-Strips ?Post-procedure details:  ?  Dressing:  Non-adherent dressing ?  Procedure completion:  Tolerated well, no immediate complications  ? ? ?Medications Ordered in ED ?Medications - No data to display ? ?ED Course/ Medical Decision Making/ A&P ?   ?                        ?Medical Decision Making ?Amount and/or Complexity of Data Reviewed ?Radiology: ordered. ? ? ?This patient presents to the ED for concern of head injury, this involves an extensive num

## 2022-01-22 NOTE — Discharge Instructions (Signed)
You were seen today for a head injury and skin tears. CT shows no acute intracranial abnormality. Your laceration on the arm was closed with steri-strips. Keep the area around the wound clean.  Follow up with your PCP as needed.  ?

## 2022-01-22 NOTE — ED Triage Notes (Signed)
Pt tripped and fell this morning, hitting face against fan leg, left eye bruised with small lac, skin tear to left arm. No LOC pt is not on thinners ?

## 2022-01-22 NOTE — ED Provider Triage Note (Signed)
Emergency Medicine Provider Triage Evaluation Note ? ?[name] , a 88 y.o. female  was evaluated in triage.  Pt complains of left thigh laceration and left forearm skin tears.  The patient's daughter states that the patient had a mechanical fall with her legs becoming entangled with each other this morning she fell and hit her left eye on the corner of the base of a fan at the ground-level.  She denies loss of consciousness.  Endorses skin tear inferior to the left eye.  Endorses skin tear on left forearm. ? ?Review of Systems  ?Positive: Skin year, bruising ?Negative: Loss of consciousness ? ?Physical Exam  ?BP (!) 165/71 (BP Location: Right Arm)   Pulse 71   Temp 97.8 ?F (36.6 ?C) (Oral)   Resp 18   Ht 5' (1.524 m)   Wt 59.9 kg   SpO2 100%   BMI 25.78 kg/m?  ?Gen:   Awake, no distress   ?Resp:  Normal effort  ?MSK:   Moves extremities without difficulty  ?Other:   ? ?Medical Decision Making  ?Medically screening exam initiated at 11:17 AM.  Appropriate orders placed.  [name] was informed that the remainder of the evaluation will be completed by another provider, this initial triage assessment does not replace that evaluation, and the importance of remaining in the ED until their evaluation is complete. ? ? ?  ?Darrick Grinder, PA-C ?01/22/22 1119 ? ?

## 2022-01-22 NOTE — ED Notes (Signed)
Pt states understanding of dc instructions, importance of follow up. Pt denies questions or concerns and transportation assistance upon dc was given. No belongings left in room upon dc. ? ?

## 2022-01-24 ENCOUNTER — Other Ambulatory Visit (HOSPITAL_COMMUNITY): Payer: Self-pay

## 2022-01-24 DIAGNOSIS — K219 Gastro-esophageal reflux disease without esophagitis: Secondary | ICD-10-CM | POA: Diagnosis not present

## 2022-01-24 DIAGNOSIS — Z1612 Extended spectrum beta lactamase (ESBL) resistance: Secondary | ICD-10-CM | POA: Diagnosis not present

## 2022-01-24 DIAGNOSIS — I2510 Atherosclerotic heart disease of native coronary artery without angina pectoris: Secondary | ICD-10-CM | POA: Diagnosis not present

## 2022-01-24 DIAGNOSIS — Z87440 Personal history of urinary (tract) infections: Secondary | ICD-10-CM | POA: Diagnosis not present

## 2022-01-24 DIAGNOSIS — N1832 Chronic kidney disease, stage 3b: Secondary | ICD-10-CM | POA: Diagnosis not present

## 2022-01-24 DIAGNOSIS — I5032 Chronic diastolic (congestive) heart failure: Secondary | ICD-10-CM | POA: Diagnosis not present

## 2022-01-24 DIAGNOSIS — R5381 Other malaise: Secondary | ICD-10-CM | POA: Diagnosis not present

## 2022-01-24 DIAGNOSIS — I130 Hypertensive heart and chronic kidney disease with heart failure and stage 1 through stage 4 chronic kidney disease, or unspecified chronic kidney disease: Secondary | ICD-10-CM | POA: Diagnosis not present

## 2022-01-24 DIAGNOSIS — H3530 Unspecified macular degeneration: Secondary | ICD-10-CM | POA: Diagnosis not present

## 2022-01-24 DIAGNOSIS — N390 Urinary tract infection, site not specified: Secondary | ICD-10-CM | POA: Diagnosis not present

## 2022-01-24 DIAGNOSIS — R531 Weakness: Secondary | ICD-10-CM | POA: Diagnosis not present

## 2022-01-24 DIAGNOSIS — H409 Unspecified glaucoma: Secondary | ICD-10-CM | POA: Diagnosis not present

## 2022-01-24 DIAGNOSIS — Z8673 Personal history of transient ischemic attack (TIA), and cerebral infarction without residual deficits: Secondary | ICD-10-CM | POA: Diagnosis not present

## 2022-01-24 DIAGNOSIS — Z9181 History of falling: Secondary | ICD-10-CM | POA: Diagnosis not present

## 2022-01-24 DIAGNOSIS — N3000 Acute cystitis without hematuria: Secondary | ICD-10-CM | POA: Diagnosis not present

## 2022-01-24 DIAGNOSIS — E875 Hyperkalemia: Secondary | ICD-10-CM | POA: Diagnosis not present

## 2022-01-24 DIAGNOSIS — E785 Hyperlipidemia, unspecified: Secondary | ICD-10-CM | POA: Diagnosis not present

## 2022-01-24 DIAGNOSIS — R238 Other skin changes: Secondary | ICD-10-CM | POA: Diagnosis not present

## 2022-01-25 DIAGNOSIS — R8271 Bacteriuria: Secondary | ICD-10-CM | POA: Diagnosis not present

## 2022-01-26 ENCOUNTER — Ambulatory Visit: Payer: Medicare Other | Admitting: Podiatry

## 2022-01-26 DIAGNOSIS — I130 Hypertensive heart and chronic kidney disease with heart failure and stage 1 through stage 4 chronic kidney disease, or unspecified chronic kidney disease: Secondary | ICD-10-CM | POA: Diagnosis not present

## 2022-01-26 DIAGNOSIS — Z9181 History of falling: Secondary | ICD-10-CM | POA: Diagnosis not present

## 2022-01-26 DIAGNOSIS — N390 Urinary tract infection, site not specified: Secondary | ICD-10-CM | POA: Diagnosis not present

## 2022-01-26 DIAGNOSIS — N3000 Acute cystitis without hematuria: Secondary | ICD-10-CM | POA: Diagnosis not present

## 2022-01-26 DIAGNOSIS — E0843 Diabetes mellitus due to underlying condition with diabetic autonomic (poly)neuropathy: Secondary | ICD-10-CM

## 2022-01-26 DIAGNOSIS — L97512 Non-pressure chronic ulcer of other part of right foot with fat layer exposed: Secondary | ICD-10-CM | POA: Diagnosis not present

## 2022-01-26 DIAGNOSIS — I2510 Atherosclerotic heart disease of native coronary artery without angina pectoris: Secondary | ICD-10-CM | POA: Diagnosis not present

## 2022-01-26 DIAGNOSIS — I5032 Chronic diastolic (congestive) heart failure: Secondary | ICD-10-CM | POA: Diagnosis not present

## 2022-01-26 DIAGNOSIS — E875 Hyperkalemia: Secondary | ICD-10-CM | POA: Diagnosis not present

## 2022-01-26 DIAGNOSIS — R238 Other skin changes: Secondary | ICD-10-CM | POA: Diagnosis not present

## 2022-01-26 DIAGNOSIS — K219 Gastro-esophageal reflux disease without esophagitis: Secondary | ICD-10-CM | POA: Diagnosis not present

## 2022-01-26 DIAGNOSIS — H3530 Unspecified macular degeneration: Secondary | ICD-10-CM | POA: Diagnosis not present

## 2022-01-26 DIAGNOSIS — E785 Hyperlipidemia, unspecified: Secondary | ICD-10-CM | POA: Diagnosis not present

## 2022-01-26 DIAGNOSIS — R531 Weakness: Secondary | ICD-10-CM | POA: Diagnosis not present

## 2022-01-26 DIAGNOSIS — Z1612 Extended spectrum beta lactamase (ESBL) resistance: Secondary | ICD-10-CM | POA: Diagnosis not present

## 2022-01-26 DIAGNOSIS — N1832 Chronic kidney disease, stage 3b: Secondary | ICD-10-CM | POA: Diagnosis not present

## 2022-01-26 DIAGNOSIS — Z8673 Personal history of transient ischemic attack (TIA), and cerebral infarction without residual deficits: Secondary | ICD-10-CM | POA: Diagnosis not present

## 2022-01-26 DIAGNOSIS — H409 Unspecified glaucoma: Secondary | ICD-10-CM | POA: Diagnosis not present

## 2022-01-26 DIAGNOSIS — Z87440 Personal history of urinary (tract) infections: Secondary | ICD-10-CM | POA: Diagnosis not present

## 2022-01-26 DIAGNOSIS — R5381 Other malaise: Secondary | ICD-10-CM | POA: Diagnosis not present

## 2022-01-26 NOTE — Progress Notes (Signed)
? ?Subjective:  ?88 y.o. female with PMHx of diabetes mellitus presenting today with her daughter for follow-up routine foot care.  No new complaints at this time.  The wound to the plantar foot is stable.  Patient did recently sustain a fall and sustained contusions to her face.  She was evaluated for this.  They present for further treatment and evaluation ? ?Past Medical History:  ?Diagnosis Date  ? Anemia   ? Arthritis   ? 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 Fall at home 09/26/2012  ? Glaucoma   ? OU  ? Gout, joint   ? Hypertension   ? Hypertensive retinopathy   ? OU  ? Macular degeneration   ? OU  ? NSTEMI (non-ST elevated myocardial infarction) (HCC) 08/2015  ? Stroke [facility])   ? ?Past Surgical History:  ?Procedure Laterality Date  ? ABDOMINAL HYSTERECTOMY    ? BREAST SURGERY    ? CYST  ? CARDIAC CATHETERIZATION N/A 08/18/2015  ? Procedure: Left Heart Cath and Coronary Angiography;  Surgeon: Lennette Bihari, MD;  Location: [facility] INVASIVE CV LAB;  Service: Cardiovascular;  Laterality: N/A;  ? CARDIAC CATHETERIZATION  08/18/2015  ? Procedure: Coronary Stent Intervention;  Surgeon: Lennette Bihari, MD;  Location: [facility] INVASIVE CV LAB;  Service: Cardiovascular;;  ? CATARACT EXTRACTION Bilateral   ? DILATION AND CURETTAGE OF UTERUS    ? EYE SURGERY Bilateral   ? HERNIA REPAIR    ? IRIDOTOMY / IRIDECTOMY Right   ? JOINT REPLACEMENT  left knee  ? REFRACTIVE SURGERY Right 2017  ? ?Allergies  ?Allergen Reactions  ? Meloxicam Hives  ? Nitrofurantoin   ? Other Other (See Comments)  ? Guaifenesin Er Rash  ?  Other reaction(s): rash  ? Nitrofuran Derivatives Rash  ? ?  ? ?Objective/Physical Exam ?General: The patient is alert and oriented x3 in no acute distress. ? ?Dermatology:  ?Wound #1 appears stable.  No significant change since last visit.  Wound #1 noted to the right plantar forefoot which continues to measure approximately 0.5x0.6 x 0.2 cm (LxWxD).  Periwound callus  noted. ?To the noted ulceration(s), there is no eschar. There is a moderate amount of slough, fibrin, and necrotic tissue noted. Granulation tissue and wound base is red. There is a minimal amount of serosanguineous drainage noted. There is no exposed bone muscle-tendon ligament or joint. There is no malodor.  ? ? ?Vascular: Palpable pedal pulses bilaterally. No edema or erythema noted. Capillary refill within normal limits. ? ?Neurological: Epicritic and protective threshold diminished bilaterally.  ? ?Musculoskeletal Exam: Fat pad atrophy with tenderness to palpation right forefoot.  Palpation of the metatarsal heads noted specifically to the subsecond and third MTPJ ? ?Assessment: ?1.  Ulcer right plantar forefoot secondary to diabetes mellitus ?2. diabetes mellitus w/ peripheral neuropathy ? ? ?Plan of Care:  ?1. Patient was evaluated.   ?2.  Continue conservative treatment ?3.  Medically necessary excisional debridement including subcutaneous tissue was performed using a tissue nipper.  Excisional debridement of all necrotic nonviable tissue down to healthier bleeding viable tissue was performed with postdebridement measurement same as pre- ?4.  Continue diabetic shoes and insoles with offloading felt pads ?5.  Continue gentamicin cream and a Band-Aid daily ?6.  Return to clinic monthly ? ?Felecia Shelling, DPM ?[facility] ? ?Dr. Felecia Shelling, DPM  ?  ?[address]                                       ?  [place], [place] [address]                ?Office [phone]  ?Fax [phone] ? ? ? ? ?

## 2022-01-27 DIAGNOSIS — E785 Hyperlipidemia, unspecified: Secondary | ICD-10-CM | POA: Diagnosis not present

## 2022-01-27 DIAGNOSIS — N390 Urinary tract infection, site not specified: Secondary | ICD-10-CM | POA: Diagnosis not present

## 2022-01-27 DIAGNOSIS — R238 Other skin changes: Secondary | ICD-10-CM | POA: Diagnosis not present

## 2022-01-27 DIAGNOSIS — E875 Hyperkalemia: Secondary | ICD-10-CM | POA: Diagnosis not present

## 2022-01-27 DIAGNOSIS — N1832 Chronic kidney disease, stage 3b: Secondary | ICD-10-CM | POA: Diagnosis not present

## 2022-01-27 DIAGNOSIS — K219 Gastro-esophageal reflux disease without esophagitis: Secondary | ICD-10-CM | POA: Diagnosis not present

## 2022-01-27 DIAGNOSIS — H3530 Unspecified macular degeneration: Secondary | ICD-10-CM | POA: Diagnosis not present

## 2022-01-27 DIAGNOSIS — Z8673 Personal history of transient ischemic attack (TIA), and cerebral infarction without residual deficits: Secondary | ICD-10-CM | POA: Diagnosis not present

## 2022-01-27 DIAGNOSIS — Z1612 Extended spectrum beta lactamase (ESBL) resistance: Secondary | ICD-10-CM | POA: Diagnosis not present

## 2022-01-27 DIAGNOSIS — I130 Hypertensive heart and chronic kidney disease with heart failure and stage 1 through stage 4 chronic kidney disease, or unspecified chronic kidney disease: Secondary | ICD-10-CM | POA: Diagnosis not present

## 2022-01-27 DIAGNOSIS — I2510 Atherosclerotic heart disease of native coronary artery without angina pectoris: Secondary | ICD-10-CM | POA: Diagnosis not present

## 2022-01-27 DIAGNOSIS — I5032 Chronic diastolic (congestive) heart failure: Secondary | ICD-10-CM | POA: Diagnosis not present

## 2022-01-27 DIAGNOSIS — R531 Weakness: Secondary | ICD-10-CM | POA: Diagnosis not present

## 2022-01-27 DIAGNOSIS — Z9181 History of falling: Secondary | ICD-10-CM | POA: Diagnosis not present

## 2022-01-27 DIAGNOSIS — R5381 Other malaise: Secondary | ICD-10-CM | POA: Diagnosis not present

## 2022-01-27 DIAGNOSIS — Z87440 Personal history of urinary (tract) infections: Secondary | ICD-10-CM | POA: Diagnosis not present

## 2022-01-27 DIAGNOSIS — H409 Unspecified glaucoma: Secondary | ICD-10-CM | POA: Diagnosis not present

## 2022-01-27 DIAGNOSIS — N3000 Acute cystitis without hematuria: Secondary | ICD-10-CM | POA: Diagnosis not present

## 2022-01-27 NOTE — Progress Notes (Signed)
?[facility] - Clinic Note ? ?02/03/2022 ? ?  ? ?CHIEF COMPLAINT ?Patient presents for Retina Follow Up ? ? ? ?HISTORY OF PRESENT ILLNESS: ?[name] is a 88 y.o. female who presents to the clinic today for:  ?HPI   ? ? Retina Follow Up   ?Patient presents with  Other.  In right eye.  This started 12 weeks ago.  I, the attending physician,  performed the HPI with the patient and updated documentation appropriately. ? ?  ?  ? ? 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 Patient states vision is ok. No eye pain. They itch. Had a UTI was in hospital about 3 weeks ago. About 2 weeks ago fell face hit the leg of a fan. Went to ER. ? ?  ?  ?Last edited by Rennis Chris, MD on 02/08/2022  7:15 PM.  ?  ?Pt has spent the last 3 weekends in the hospital or ER, she had a UTI that she was hospitalized for and she fell last week and landed on her face, she is still using Lotemax 6x/day and Muro 128 QID ? ?Referring physician: ?Irena Reichmann, DO ?[address] ?[address] ?[place],  [place] [address] ? ?HISTORICAL INFORMATION:  ? ?Selected notes from the MEDICAL RECORD NUMBER ?Referred by Dr. Marchelle Gearing for concern of RVO OS  ? ?CURRENT MEDICATIONS: ?Current Outpatient Medications (Ophthalmic Drugs)  ?Medication Sig  ? brimonidine (ALPHAGAN) 0.2 % ophthalmic solution Place 1 drop into both eyes in the morning and at bedtime.  ? LOTEMAX SM 0.38 % GEL INSTILL 1 DROP SIX TIMES A DAY IN RIGHT EYE. (Patient taking differently: Place 1 drop into the right eye See admin instructions. Instill 1 drop six times a day in right eye.)  ? Polyethyl Glycol-Propyl Glycol (SYSTANE OP) Apply 1 drop to eye 2 (two) times daily as needed (dry eyes).  ? sodium chloride (MURO 128) 5 % ophthalmic ointment Place 1 application into the right eye at bedtime.  ? sodium chloride (MURO 128) 5 % ophthalmic solution INSTILL 1 DROP INTO RIGHT EYE 4 TIMES A DAY  ? timolol (TIMOPTIC) 0.5 % ophthalmic  solution Place 1 drop into both eyes every morning.  ? ?No current facility-administered medications for this visit. (Ophthalmic Drugs)  ? ?Current Outpatient Medications (Other)  ?Medication Sig  ? Acetaminophen 325 MG CAPS Take 325 mg by mouth 3 (three) times daily as needed.  ? amLODipine (NORVASC) 2.5 MG tablet Take 1 tablet (2.5 mg total) by mouth daily.  ? aspirin 81 MG chewable tablet Chew 81 mg by mouth daily.  ? atorvastatin (LIPITOR) 80 MG tablet Take 1 tablet (80 mg total) by mouth daily at 6 PM.  ? Calcium Carbonate-Vitamin D (CALTRATE 600+D PO) Take 1 tablet by mouth daily.  ? chlorpheniramine (CHLOR-TRIMETON) 4 MG tablet Take 4 mg by mouth daily.  ? denosumab (PROLIA) 60 MG/ML SOSY injection 60 mg See admin instructions.  ? famotidine (PEPCID) 20 MG tablet Take 20 mg by mouth daily.  ? FLUZONE HIGH-DOSE QUADRIVALENT 0.7 ML SUSY   ? furosemide (LASIX) 40 MG tablet Take 20 mg by mouth daily.  ? GEMTESA 75 MG TABS Take 75 mg by mouth daily.  ? gentamicin cream (GARAMYCIN) 0.1 % Apply 1 application topically 2 (two) times daily.  ? metoprolol (LOPRESSOR) 50 MG tablet Take 25 mg by mouth 2 (two) times daily.  ? PFIZER-BIONT COVID-19 VAC-TRIS SUSP  injection   ? ?No current facility-administered medications for this visit. (Other)  ? ?REVIEW OF SYSTEMS: ?ROS   ?Positive for: Cardiovascular, Eyes, Respiratory ?Negative for: Constitutional, Gastrointestinal, Neurological, Skin, Genitourinary, Musculoskeletal, HENT, Endocrine, Psychiatric, Allergic/Imm, Heme/Lymph ?Last edited by Laddie Aquas, COA on 02/03/2022  9:56 AM.  ?  ? ? ?ALLERGIES ?Allergies  ?Allergen Reactions  ? Meloxicam Hives  ? Nitrofurantoin   ? Other Other (See Comments)  ? Guaifenesin Er Rash  ?  Other reaction(s): rash  ? Nitrofuran Derivatives Rash  ? ?PAST MEDICAL HISTORY ?Past Medical History:  ?Diagnosis Date  ? Anemia   ? Arthritis   ? 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 Fall at  home 09/26/2012  ? Glaucoma   ? OU  ? Gout, joint   ? Hypertension   ? Hypertensive retinopathy   ? OU  ? Macular degeneration   ? OU  ? NSTEMI (non-ST elevated myocardial infarction) (HCC) 08/2015  ? Stroke [facility])   ? ?Past Surgical History:  ?Procedure Laterality Date  ? ABDOMINAL HYSTERECTOMY    ? BREAST SURGERY    ? CYST  ? CARDIAC CATHETERIZATION N/A 08/18/2015  ? Procedure: Left Heart Cath and Coronary Angiography;  Surgeon: Lennette Bihari, MD;  Location: [facility] INVASIVE CV LAB;  Service: Cardiovascular;  Laterality: N/A;  ? CARDIAC CATHETERIZATION  08/18/2015  ? Procedure: Coronary Stent Intervention;  Surgeon: Lennette Bihari, MD;  Location: [facility] INVASIVE CV LAB;  Service: Cardiovascular;;  ? CATARACT EXTRACTION Bilateral   ? DILATION AND CURETTAGE OF UTERUS    ? EYE SURGERY Bilateral   ? HERNIA REPAIR    ? IRIDOTOMY / IRIDECTOMY Right   ? JOINT REPLACEMENT  left knee  ? REFRACTIVE SURGERY Right 2017  ? ?FAMILY HISTORY ?Family History  ?Problem Relation Age of Onset  ? Heart disease Mother   ? Stroke Mother   ? ?SOCIAL HISTORY ?Social History  ? ?Tobacco Use  ? Smoking status: Never  ? Smokeless tobacco: Never  ?Vaping Use  ? Vaping Use: Never used  ?Substance Use Topics  ? Alcohol use: No  ?  Alcohol/week: 0.0 standard drinks  ? Drug use: No  ?  ? ?  ?OPHTHALMIC EXAM: ? ?Base Eye Exam   ? ? Visual Acuity (Snellen - Linear)   ? ?   Right Left  ? Dist [place] HM 20/30 -2  ? Dist ph [place]  20/30 +2  ? ?  ?  ? ? Tonometry (Tonopen, 9:51 AM)   ? ?   Right Left  ? Pressure 11 16  ? ?  ?  ? ? Pupils   ? ?   Dark Light Shape React APD  ? Right 2 1 Round Minimal None  ? Left 3 2 Round Brisk None  ? ?  ?  ? ? Visual Fields (Counting fingers)   ? ?   Left Right  ?  Full   ? Restrictions  Total superior temporal, inferior temporal, inferior nasal deficiencies; Partial inner superior nasal deficiency  ? ?  ?  ? ? Extraocular Movement   ? ?   Right Left  ?  Full, Ortho Full, Ortho  ? ?  ?  ? ? Neuro/Psych   ? ? Oriented x3: Yes  ?  Mood/Affect: Normal  ? ?  ?  ? ? Dilation   ? ? Both eyes: 1.0% Mydriacyl, 2.5% Phenylephrine @ 9:51 AM  ? ?  ?  ? ?  ? ?  Slit Lamp and Fundus Exam   ? ? External Exam   ? ?   Right Left  ? External  left sided facial ecchymosis  ? ?  ?  ? ? Slit Lamp Exam   ? ?   Right Left  ? Lids/Lashes Dermatochalasis - upper lid, mild Telangiectasia Dermatochalasis - upper lid, mild Meibomian gland dysfunction  ? Conjunctiva/Sclera White and quiet Trace Injection  ? Cornea Arcus, 3+ edema w/ Descemet's folds, well healed temporal cataract wounds, 2+ fine endo pigment, 2-3+ Punctate epithelial erosions Arcus, 1-2+Punctate epithelial erosions, Well healed cataract wounds, tear film debris  ? Anterior Chamber Deep and clear, No cells or pigment Deep and quiet  ? Iris round and poorly dilated, No NVI Round and moderately dilated  ? Lens PC IOL in good position with open PC PC IOL in good position with open PC  ? Anterior Vitreous Vitreous syneresis, +RBC, mild diffuse VH, vitreous condensations, +pigment Vitreous syneresis, Posterior vitreous detachment  ? ?  ?  ? ? Fundus Exam   ? ?   Right Left  ? Disc Hazy view, 3+pallor, +cupping, thin inf rim, temp PPA, Sharp rim 1+ Pallor, Sharp rim, +cupping, temporal Peripapillary atrophy  ? C/D Ratio 0.8 0.6  ? Macula Hazy view -- grossly flat, central pigment clump, mild fibrosis, no heme Flat, good foveal reflex, RPE mottling and clumping, drusen, No heme or edema  ? Vessels attenuated, Tortuous attenuated, Tortuous  ? Periphery Hazy view, grossly Attached, obscured by corneal edema Attached, No heme   ? ?  ?  ? ?  ? ?Refraction   ? ? Wearing Rx   ? ?   Sphere Cylinder Axis  ? Right -1.50 +2.25 173  ? Left -0.75 +2.25 166  ? ?  ?  ? ?  ? ? ?IMAGING AND PROCEDURES  ?Imaging and Procedures for @ ? ?OCT, Retina - OU - Both Eyes   ? ?   ?Right Eye ?Quality was poor. Central Foveal Thickness: 995. Progression has worsened. Findings include abnormal foveal contour, no SRF, inner retinal  atrophy, outer retinal atrophy, subretinal hyper-reflective material (Very poor image quality, retina attached with persistent retinal thickening / edema).  ? ?Left Eye ?Quality was borderline. Central Foveal Thicknes

## 2022-01-28 DIAGNOSIS — I5032 Chronic diastolic (congestive) heart failure: Secondary | ICD-10-CM | POA: Diagnosis not present

## 2022-01-28 DIAGNOSIS — N3000 Acute cystitis without hematuria: Secondary | ICD-10-CM | POA: Diagnosis not present

## 2022-01-28 DIAGNOSIS — N1832 Chronic kidney disease, stage 3b: Secondary | ICD-10-CM | POA: Diagnosis not present

## 2022-01-28 DIAGNOSIS — R531 Weakness: Secondary | ICD-10-CM | POA: Diagnosis not present

## 2022-01-28 DIAGNOSIS — R238 Other skin changes: Secondary | ICD-10-CM | POA: Diagnosis not present

## 2022-01-28 DIAGNOSIS — H409 Unspecified glaucoma: Secondary | ICD-10-CM | POA: Diagnosis not present

## 2022-01-28 DIAGNOSIS — Z9181 History of falling: Secondary | ICD-10-CM | POA: Diagnosis not present

## 2022-01-28 DIAGNOSIS — Z8673 Personal history of transient ischemic attack (TIA), and cerebral infarction without residual deficits: Secondary | ICD-10-CM | POA: Diagnosis not present

## 2022-01-28 DIAGNOSIS — E785 Hyperlipidemia, unspecified: Secondary | ICD-10-CM | POA: Diagnosis not present

## 2022-01-28 DIAGNOSIS — E875 Hyperkalemia: Secondary | ICD-10-CM | POA: Diagnosis not present

## 2022-01-28 DIAGNOSIS — N390 Urinary tract infection, site not specified: Secondary | ICD-10-CM | POA: Diagnosis not present

## 2022-01-28 DIAGNOSIS — I2510 Atherosclerotic heart disease of native coronary artery without angina pectoris: Secondary | ICD-10-CM | POA: Diagnosis not present

## 2022-01-28 DIAGNOSIS — Z1612 Extended spectrum beta lactamase (ESBL) resistance: Secondary | ICD-10-CM | POA: Diagnosis not present

## 2022-01-28 DIAGNOSIS — K219 Gastro-esophageal reflux disease without esophagitis: Secondary | ICD-10-CM | POA: Diagnosis not present

## 2022-01-28 DIAGNOSIS — R5381 Other malaise: Secondary | ICD-10-CM | POA: Diagnosis not present

## 2022-01-28 DIAGNOSIS — I130 Hypertensive heart and chronic kidney disease with heart failure and stage 1 through stage 4 chronic kidney disease, or unspecified chronic kidney disease: Secondary | ICD-10-CM | POA: Diagnosis not present

## 2022-01-28 DIAGNOSIS — H3530 Unspecified macular degeneration: Secondary | ICD-10-CM | POA: Diagnosis not present

## 2022-01-28 DIAGNOSIS — Z87440 Personal history of urinary (tract) infections: Secondary | ICD-10-CM | POA: Diagnosis not present

## 2022-02-01 DIAGNOSIS — E785 Hyperlipidemia, unspecified: Secondary | ICD-10-CM | POA: Diagnosis not present

## 2022-02-01 DIAGNOSIS — I2510 Atherosclerotic heart disease of native coronary artery without angina pectoris: Secondary | ICD-10-CM | POA: Diagnosis not present

## 2022-02-01 DIAGNOSIS — K219 Gastro-esophageal reflux disease without esophagitis: Secondary | ICD-10-CM | POA: Diagnosis not present

## 2022-02-01 DIAGNOSIS — Z87440 Personal history of urinary (tract) infections: Secondary | ICD-10-CM | POA: Diagnosis not present

## 2022-02-01 DIAGNOSIS — E875 Hyperkalemia: Secondary | ICD-10-CM | POA: Diagnosis not present

## 2022-02-01 DIAGNOSIS — R531 Weakness: Secondary | ICD-10-CM | POA: Diagnosis not present

## 2022-02-01 DIAGNOSIS — I5032 Chronic diastolic (congestive) heart failure: Secondary | ICD-10-CM | POA: Diagnosis not present

## 2022-02-01 DIAGNOSIS — H3530 Unspecified macular degeneration: Secondary | ICD-10-CM | POA: Diagnosis not present

## 2022-02-01 DIAGNOSIS — N390 Urinary tract infection, site not specified: Secondary | ICD-10-CM | POA: Diagnosis not present

## 2022-02-01 DIAGNOSIS — Z9181 History of falling: Secondary | ICD-10-CM | POA: Diagnosis not present

## 2022-02-01 DIAGNOSIS — I130 Hypertensive heart and chronic kidney disease with heart failure and stage 1 through stage 4 chronic kidney disease, or unspecified chronic kidney disease: Secondary | ICD-10-CM | POA: Diagnosis not present

## 2022-02-01 DIAGNOSIS — Z1612 Extended spectrum beta lactamase (ESBL) resistance: Secondary | ICD-10-CM | POA: Diagnosis not present

## 2022-02-01 DIAGNOSIS — N1832 Chronic kidney disease, stage 3b: Secondary | ICD-10-CM | POA: Diagnosis not present

## 2022-02-01 DIAGNOSIS — H409 Unspecified glaucoma: Secondary | ICD-10-CM | POA: Diagnosis not present

## 2022-02-01 DIAGNOSIS — Z8673 Personal history of transient ischemic attack (TIA), and cerebral infarction without residual deficits: Secondary | ICD-10-CM | POA: Diagnosis not present

## 2022-02-01 DIAGNOSIS — N3000 Acute cystitis without hematuria: Secondary | ICD-10-CM | POA: Diagnosis not present

## 2022-02-01 DIAGNOSIS — R238 Other skin changes: Secondary | ICD-10-CM | POA: Diagnosis not present

## 2022-02-01 DIAGNOSIS — R5381 Other malaise: Secondary | ICD-10-CM | POA: Diagnosis not present

## 2022-02-02 DIAGNOSIS — K219 Gastro-esophageal reflux disease without esophagitis: Secondary | ICD-10-CM | POA: Diagnosis not present

## 2022-02-02 DIAGNOSIS — R531 Weakness: Secondary | ICD-10-CM | POA: Diagnosis not present

## 2022-02-02 DIAGNOSIS — E785 Hyperlipidemia, unspecified: Secondary | ICD-10-CM | POA: Diagnosis not present

## 2022-02-02 DIAGNOSIS — I130 Hypertensive heart and chronic kidney disease with heart failure and stage 1 through stage 4 chronic kidney disease, or unspecified chronic kidney disease: Secondary | ICD-10-CM | POA: Diagnosis not present

## 2022-02-02 DIAGNOSIS — H3530 Unspecified macular degeneration: Secondary | ICD-10-CM | POA: Diagnosis not present

## 2022-02-02 DIAGNOSIS — Z9181 History of falling: Secondary | ICD-10-CM | POA: Diagnosis not present

## 2022-02-02 DIAGNOSIS — N390 Urinary tract infection, site not specified: Secondary | ICD-10-CM | POA: Diagnosis not present

## 2022-02-02 DIAGNOSIS — E875 Hyperkalemia: Secondary | ICD-10-CM | POA: Diagnosis not present

## 2022-02-02 DIAGNOSIS — I2510 Atherosclerotic heart disease of native coronary artery without angina pectoris: Secondary | ICD-10-CM | POA: Diagnosis not present

## 2022-02-02 DIAGNOSIS — H409 Unspecified glaucoma: Secondary | ICD-10-CM | POA: Diagnosis not present

## 2022-02-02 DIAGNOSIS — I5032 Chronic diastolic (congestive) heart failure: Secondary | ICD-10-CM | POA: Diagnosis not present

## 2022-02-02 DIAGNOSIS — R5381 Other malaise: Secondary | ICD-10-CM | POA: Diagnosis not present

## 2022-02-02 DIAGNOSIS — N1832 Chronic kidney disease, stage 3b: Secondary | ICD-10-CM | POA: Diagnosis not present

## 2022-02-02 DIAGNOSIS — N3000 Acute cystitis without hematuria: Secondary | ICD-10-CM | POA: Diagnosis not present

## 2022-02-02 DIAGNOSIS — Z1612 Extended spectrum beta lactamase (ESBL) resistance: Secondary | ICD-10-CM | POA: Diagnosis not present

## 2022-02-02 DIAGNOSIS — Z8673 Personal history of transient ischemic attack (TIA), and cerebral infarction without residual deficits: Secondary | ICD-10-CM | POA: Diagnosis not present

## 2022-02-02 DIAGNOSIS — R238 Other skin changes: Secondary | ICD-10-CM | POA: Diagnosis not present

## 2022-02-02 DIAGNOSIS — Z87440 Personal history of urinary (tract) infections: Secondary | ICD-10-CM | POA: Diagnosis not present

## 2022-02-03 ENCOUNTER — Ambulatory Visit (INDEPENDENT_AMBULATORY_CARE_PROVIDER_SITE_OTHER): Payer: Medicare Other | Admitting: Ophthalmology

## 2022-02-03 ENCOUNTER — Encounter (INDEPENDENT_AMBULATORY_CARE_PROVIDER_SITE_OTHER): Payer: Self-pay | Admitting: Ophthalmology

## 2022-02-03 DIAGNOSIS — H34831 Tributary (branch) retinal vein occlusion, right eye, with macular edema: Secondary | ICD-10-CM | POA: Diagnosis not present

## 2022-02-03 DIAGNOSIS — H35033 Hypertensive retinopathy, bilateral: Secondary | ICD-10-CM | POA: Diagnosis not present

## 2022-02-03 DIAGNOSIS — H401133 Primary open-angle glaucoma, bilateral, severe stage: Secondary | ICD-10-CM | POA: Diagnosis not present

## 2022-02-03 DIAGNOSIS — H353211 Exudative age-related macular degeneration, right eye, with active choroidal neovascularization: Secondary | ICD-10-CM | POA: Diagnosis not present

## 2022-02-03 DIAGNOSIS — H4311 Vitreous hemorrhage, right eye: Secondary | ICD-10-CM

## 2022-02-03 DIAGNOSIS — H1820 Unspecified corneal edema: Secondary | ICD-10-CM | POA: Diagnosis not present

## 2022-02-03 DIAGNOSIS — H353122 Nonexudative age-related macular degeneration, left eye, intermediate dry stage: Secondary | ICD-10-CM

## 2022-02-03 DIAGNOSIS — H04123 Dry eye syndrome of bilateral lacrimal glands: Secondary | ICD-10-CM | POA: Diagnosis not present

## 2022-02-03 DIAGNOSIS — Z961 Presence of intraocular lens: Secondary | ICD-10-CM

## 2022-02-03 DIAGNOSIS — I10 Essential (primary) hypertension: Secondary | ICD-10-CM

## 2022-02-04 ENCOUNTER — Encounter (INDEPENDENT_AMBULATORY_CARE_PROVIDER_SITE_OTHER): Payer: Medicare Other | Admitting: Ophthalmology

## 2022-02-04 DIAGNOSIS — E875 Hyperkalemia: Secondary | ICD-10-CM | POA: Diagnosis not present

## 2022-02-04 DIAGNOSIS — Z9181 History of falling: Secondary | ICD-10-CM | POA: Diagnosis not present

## 2022-02-04 DIAGNOSIS — I2510 Atherosclerotic heart disease of native coronary artery without angina pectoris: Secondary | ICD-10-CM | POA: Diagnosis not present

## 2022-02-04 DIAGNOSIS — R5381 Other malaise: Secondary | ICD-10-CM | POA: Diagnosis not present

## 2022-02-04 DIAGNOSIS — H409 Unspecified glaucoma: Secondary | ICD-10-CM | POA: Diagnosis not present

## 2022-02-04 DIAGNOSIS — R238 Other skin changes: Secondary | ICD-10-CM | POA: Diagnosis not present

## 2022-02-04 DIAGNOSIS — N3000 Acute cystitis without hematuria: Secondary | ICD-10-CM | POA: Diagnosis not present

## 2022-02-04 DIAGNOSIS — N390 Urinary tract infection, site not specified: Secondary | ICD-10-CM | POA: Diagnosis not present

## 2022-02-04 DIAGNOSIS — I130 Hypertensive heart and chronic kidney disease with heart failure and stage 1 through stage 4 chronic kidney disease, or unspecified chronic kidney disease: Secondary | ICD-10-CM | POA: Diagnosis not present

## 2022-02-04 DIAGNOSIS — K219 Gastro-esophageal reflux disease without esophagitis: Secondary | ICD-10-CM | POA: Diagnosis not present

## 2022-02-04 DIAGNOSIS — R531 Weakness: Secondary | ICD-10-CM | POA: Diagnosis not present

## 2022-02-04 DIAGNOSIS — I5032 Chronic diastolic (congestive) heart failure: Secondary | ICD-10-CM | POA: Diagnosis not present

## 2022-02-04 DIAGNOSIS — Z87440 Personal history of urinary (tract) infections: Secondary | ICD-10-CM | POA: Diagnosis not present

## 2022-02-04 DIAGNOSIS — E785 Hyperlipidemia, unspecified: Secondary | ICD-10-CM | POA: Diagnosis not present

## 2022-02-04 DIAGNOSIS — Z8673 Personal history of transient ischemic attack (TIA), and cerebral infarction without residual deficits: Secondary | ICD-10-CM | POA: Diagnosis not present

## 2022-02-04 DIAGNOSIS — N1832 Chronic kidney disease, stage 3b: Secondary | ICD-10-CM | POA: Diagnosis not present

## 2022-02-04 DIAGNOSIS — Z1612 Extended spectrum beta lactamase (ESBL) resistance: Secondary | ICD-10-CM | POA: Diagnosis not present

## 2022-02-04 DIAGNOSIS — H3530 Unspecified macular degeneration: Secondary | ICD-10-CM | POA: Diagnosis not present

## 2022-02-08 ENCOUNTER — Encounter (INDEPENDENT_AMBULATORY_CARE_PROVIDER_SITE_OTHER): Payer: Self-pay | Admitting: Ophthalmology

## 2022-02-08 DIAGNOSIS — H409 Unspecified glaucoma: Secondary | ICD-10-CM | POA: Diagnosis not present

## 2022-02-08 DIAGNOSIS — Z9181 History of falling: Secondary | ICD-10-CM | POA: Diagnosis not present

## 2022-02-08 DIAGNOSIS — Z1612 Extended spectrum beta lactamase (ESBL) resistance: Secondary | ICD-10-CM | POA: Diagnosis not present

## 2022-02-08 DIAGNOSIS — I130 Hypertensive heart and chronic kidney disease with heart failure and stage 1 through stage 4 chronic kidney disease, or unspecified chronic kidney disease: Secondary | ICD-10-CM | POA: Diagnosis not present

## 2022-02-08 DIAGNOSIS — H3530 Unspecified macular degeneration: Secondary | ICD-10-CM | POA: Diagnosis not present

## 2022-02-08 DIAGNOSIS — E785 Hyperlipidemia, unspecified: Secondary | ICD-10-CM | POA: Diagnosis not present

## 2022-02-08 DIAGNOSIS — K219 Gastro-esophageal reflux disease without esophagitis: Secondary | ICD-10-CM | POA: Diagnosis not present

## 2022-02-08 DIAGNOSIS — E875 Hyperkalemia: Secondary | ICD-10-CM | POA: Diagnosis not present

## 2022-02-08 DIAGNOSIS — Z87440 Personal history of urinary (tract) infections: Secondary | ICD-10-CM | POA: Diagnosis not present

## 2022-02-08 DIAGNOSIS — R531 Weakness: Secondary | ICD-10-CM | POA: Diagnosis not present

## 2022-02-08 DIAGNOSIS — N3000 Acute cystitis without hematuria: Secondary | ICD-10-CM | POA: Diagnosis not present

## 2022-02-08 DIAGNOSIS — Z8673 Personal history of transient ischemic attack (TIA), and cerebral infarction without residual deficits: Secondary | ICD-10-CM | POA: Diagnosis not present

## 2022-02-08 DIAGNOSIS — N390 Urinary tract infection, site not specified: Secondary | ICD-10-CM | POA: Diagnosis not present

## 2022-02-08 DIAGNOSIS — N1832 Chronic kidney disease, stage 3b: Secondary | ICD-10-CM | POA: Diagnosis not present

## 2022-02-08 DIAGNOSIS — I2510 Atherosclerotic heart disease of native coronary artery without angina pectoris: Secondary | ICD-10-CM | POA: Diagnosis not present

## 2022-02-08 DIAGNOSIS — R238 Other skin changes: Secondary | ICD-10-CM | POA: Diagnosis not present

## 2022-02-08 DIAGNOSIS — I5032 Chronic diastolic (congestive) heart failure: Secondary | ICD-10-CM | POA: Diagnosis not present

## 2022-02-08 DIAGNOSIS — R5381 Other malaise: Secondary | ICD-10-CM | POA: Diagnosis not present

## 2022-02-09 DIAGNOSIS — K219 Gastro-esophageal reflux disease without esophagitis: Secondary | ICD-10-CM | POA: Diagnosis not present

## 2022-02-09 DIAGNOSIS — N3000 Acute cystitis without hematuria: Secondary | ICD-10-CM | POA: Diagnosis not present

## 2022-02-09 DIAGNOSIS — H3530 Unspecified macular degeneration: Secondary | ICD-10-CM | POA: Diagnosis not present

## 2022-02-09 DIAGNOSIS — N1832 Chronic kidney disease, stage 3b: Secondary | ICD-10-CM | POA: Diagnosis not present

## 2022-02-09 DIAGNOSIS — N390 Urinary tract infection, site not specified: Secondary | ICD-10-CM | POA: Diagnosis not present

## 2022-02-09 DIAGNOSIS — Z8673 Personal history of transient ischemic attack (TIA), and cerebral infarction without residual deficits: Secondary | ICD-10-CM | POA: Diagnosis not present

## 2022-02-09 DIAGNOSIS — R5381 Other malaise: Secondary | ICD-10-CM | POA: Diagnosis not present

## 2022-02-09 DIAGNOSIS — Z1612 Extended spectrum beta lactamase (ESBL) resistance: Secondary | ICD-10-CM | POA: Diagnosis not present

## 2022-02-09 DIAGNOSIS — R531 Weakness: Secondary | ICD-10-CM | POA: Diagnosis not present

## 2022-02-09 DIAGNOSIS — H409 Unspecified glaucoma: Secondary | ICD-10-CM | POA: Diagnosis not present

## 2022-02-09 DIAGNOSIS — Z87440 Personal history of urinary (tract) infections: Secondary | ICD-10-CM | POA: Diagnosis not present

## 2022-02-09 DIAGNOSIS — R238 Other skin changes: Secondary | ICD-10-CM | POA: Diagnosis not present

## 2022-02-09 DIAGNOSIS — I5032 Chronic diastolic (congestive) heart failure: Secondary | ICD-10-CM | POA: Diagnosis not present

## 2022-02-09 DIAGNOSIS — Z9181 History of falling: Secondary | ICD-10-CM | POA: Diagnosis not present

## 2022-02-09 DIAGNOSIS — I130 Hypertensive heart and chronic kidney disease with heart failure and stage 1 through stage 4 chronic kidney disease, or unspecified chronic kidney disease: Secondary | ICD-10-CM | POA: Diagnosis not present

## 2022-02-09 DIAGNOSIS — E785 Hyperlipidemia, unspecified: Secondary | ICD-10-CM | POA: Diagnosis not present

## 2022-02-09 DIAGNOSIS — I2510 Atherosclerotic heart disease of native coronary artery without angina pectoris: Secondary | ICD-10-CM | POA: Diagnosis not present

## 2022-02-09 DIAGNOSIS — E875 Hyperkalemia: Secondary | ICD-10-CM | POA: Diagnosis not present

## 2022-02-10 DIAGNOSIS — R350 Frequency of micturition: Secondary | ICD-10-CM | POA: Diagnosis not present

## 2022-02-10 DIAGNOSIS — R8271 Bacteriuria: Secondary | ICD-10-CM | POA: Diagnosis not present

## 2022-02-14 DIAGNOSIS — H3530 Unspecified macular degeneration: Secondary | ICD-10-CM | POA: Diagnosis not present

## 2022-02-14 DIAGNOSIS — E875 Hyperkalemia: Secondary | ICD-10-CM | POA: Diagnosis not present

## 2022-02-14 DIAGNOSIS — Z87440 Personal history of urinary (tract) infections: Secondary | ICD-10-CM | POA: Diagnosis not present

## 2022-02-14 DIAGNOSIS — I2510 Atherosclerotic heart disease of native coronary artery without angina pectoris: Secondary | ICD-10-CM | POA: Diagnosis not present

## 2022-02-14 DIAGNOSIS — I130 Hypertensive heart and chronic kidney disease with heart failure and stage 1 through stage 4 chronic kidney disease, or unspecified chronic kidney disease: Secondary | ICD-10-CM | POA: Diagnosis not present

## 2022-02-14 DIAGNOSIS — R238 Other skin changes: Secondary | ICD-10-CM | POA: Diagnosis not present

## 2022-02-14 DIAGNOSIS — K219 Gastro-esophageal reflux disease without esophagitis: Secondary | ICD-10-CM | POA: Diagnosis not present

## 2022-02-14 DIAGNOSIS — Z8673 Personal history of transient ischemic attack (TIA), and cerebral infarction without residual deficits: Secondary | ICD-10-CM | POA: Diagnosis not present

## 2022-02-14 DIAGNOSIS — Z9181 History of falling: Secondary | ICD-10-CM | POA: Diagnosis not present

## 2022-02-14 DIAGNOSIS — E785 Hyperlipidemia, unspecified: Secondary | ICD-10-CM | POA: Diagnosis not present

## 2022-02-14 DIAGNOSIS — I5032 Chronic diastolic (congestive) heart failure: Secondary | ICD-10-CM | POA: Diagnosis not present

## 2022-02-14 DIAGNOSIS — N3000 Acute cystitis without hematuria: Secondary | ICD-10-CM | POA: Diagnosis not present

## 2022-02-14 DIAGNOSIS — Z1612 Extended spectrum beta lactamase (ESBL) resistance: Secondary | ICD-10-CM | POA: Diagnosis not present

## 2022-02-14 DIAGNOSIS — N390 Urinary tract infection, site not specified: Secondary | ICD-10-CM | POA: Diagnosis not present

## 2022-02-14 DIAGNOSIS — R5381 Other malaise: Secondary | ICD-10-CM | POA: Diagnosis not present

## 2022-02-14 DIAGNOSIS — N1832 Chronic kidney disease, stage 3b: Secondary | ICD-10-CM | POA: Diagnosis not present

## 2022-02-14 DIAGNOSIS — H409 Unspecified glaucoma: Secondary | ICD-10-CM | POA: Diagnosis not present

## 2022-02-14 DIAGNOSIS — R531 Weakness: Secondary | ICD-10-CM | POA: Diagnosis not present

## 2022-02-15 DIAGNOSIS — K219 Gastro-esophageal reflux disease without esophagitis: Secondary | ICD-10-CM | POA: Diagnosis not present

## 2022-02-15 DIAGNOSIS — I5032 Chronic diastolic (congestive) heart failure: Secondary | ICD-10-CM | POA: Diagnosis not present

## 2022-02-15 DIAGNOSIS — R238 Other skin changes: Secondary | ICD-10-CM | POA: Diagnosis not present

## 2022-02-15 DIAGNOSIS — E875 Hyperkalemia: Secondary | ICD-10-CM | POA: Diagnosis not present

## 2022-02-15 DIAGNOSIS — N1832 Chronic kidney disease, stage 3b: Secondary | ICD-10-CM | POA: Diagnosis not present

## 2022-02-15 DIAGNOSIS — Z87440 Personal history of urinary (tract) infections: Secondary | ICD-10-CM | POA: Diagnosis not present

## 2022-02-15 DIAGNOSIS — R5381 Other malaise: Secondary | ICD-10-CM | POA: Diagnosis not present

## 2022-02-15 DIAGNOSIS — Z8673 Personal history of transient ischemic attack (TIA), and cerebral infarction without residual deficits: Secondary | ICD-10-CM | POA: Diagnosis not present

## 2022-02-15 DIAGNOSIS — E785 Hyperlipidemia, unspecified: Secondary | ICD-10-CM | POA: Diagnosis not present

## 2022-02-15 DIAGNOSIS — R531 Weakness: Secondary | ICD-10-CM | POA: Diagnosis not present

## 2022-02-15 DIAGNOSIS — N3000 Acute cystitis without hematuria: Secondary | ICD-10-CM | POA: Diagnosis not present

## 2022-02-15 DIAGNOSIS — I2510 Atherosclerotic heart disease of native coronary artery without angina pectoris: Secondary | ICD-10-CM | POA: Diagnosis not present

## 2022-02-15 DIAGNOSIS — N390 Urinary tract infection, site not specified: Secondary | ICD-10-CM | POA: Diagnosis not present

## 2022-02-15 DIAGNOSIS — H3530 Unspecified macular degeneration: Secondary | ICD-10-CM | POA: Diagnosis not present

## 2022-02-15 DIAGNOSIS — Z9181 History of falling: Secondary | ICD-10-CM | POA: Diagnosis not present

## 2022-02-15 DIAGNOSIS — H409 Unspecified glaucoma: Secondary | ICD-10-CM | POA: Diagnosis not present

## 2022-02-15 DIAGNOSIS — Z1612 Extended spectrum beta lactamase (ESBL) resistance: Secondary | ICD-10-CM | POA: Diagnosis not present

## 2022-02-15 DIAGNOSIS — I130 Hypertensive heart and chronic kidney disease with heart failure and stage 1 through stage 4 chronic kidney disease, or unspecified chronic kidney disease: Secondary | ICD-10-CM | POA: Diagnosis not present

## 2022-02-17 DIAGNOSIS — Z1612 Extended spectrum beta lactamase (ESBL) resistance: Secondary | ICD-10-CM | POA: Diagnosis not present

## 2022-02-17 DIAGNOSIS — N390 Urinary tract infection, site not specified: Secondary | ICD-10-CM | POA: Diagnosis not present

## 2022-02-17 DIAGNOSIS — Z8673 Personal history of transient ischemic attack (TIA), and cerebral infarction without residual deficits: Secondary | ICD-10-CM | POA: Diagnosis not present

## 2022-02-17 DIAGNOSIS — E785 Hyperlipidemia, unspecified: Secondary | ICD-10-CM | POA: Diagnosis not present

## 2022-02-17 DIAGNOSIS — Z9181 History of falling: Secondary | ICD-10-CM | POA: Diagnosis not present

## 2022-02-17 DIAGNOSIS — R531 Weakness: Secondary | ICD-10-CM | POA: Diagnosis not present

## 2022-02-17 DIAGNOSIS — Z87440 Personal history of urinary (tract) infections: Secondary | ICD-10-CM | POA: Diagnosis not present

## 2022-02-17 DIAGNOSIS — I5032 Chronic diastolic (congestive) heart failure: Secondary | ICD-10-CM | POA: Diagnosis not present

## 2022-02-17 DIAGNOSIS — I130 Hypertensive heart and chronic kidney disease with heart failure and stage 1 through stage 4 chronic kidney disease, or unspecified chronic kidney disease: Secondary | ICD-10-CM | POA: Diagnosis not present

## 2022-02-17 DIAGNOSIS — N3000 Acute cystitis without hematuria: Secondary | ICD-10-CM | POA: Diagnosis not present

## 2022-02-17 DIAGNOSIS — K219 Gastro-esophageal reflux disease without esophagitis: Secondary | ICD-10-CM | POA: Diagnosis not present

## 2022-02-17 DIAGNOSIS — I2510 Atherosclerotic heart disease of native coronary artery without angina pectoris: Secondary | ICD-10-CM | POA: Diagnosis not present

## 2022-02-17 DIAGNOSIS — E875 Hyperkalemia: Secondary | ICD-10-CM | POA: Diagnosis not present

## 2022-02-17 DIAGNOSIS — R238 Other skin changes: Secondary | ICD-10-CM | POA: Diagnosis not present

## 2022-02-17 DIAGNOSIS — H3530 Unspecified macular degeneration: Secondary | ICD-10-CM | POA: Diagnosis not present

## 2022-02-17 DIAGNOSIS — N1832 Chronic kidney disease, stage 3b: Secondary | ICD-10-CM | POA: Diagnosis not present

## 2022-02-17 DIAGNOSIS — H409 Unspecified glaucoma: Secondary | ICD-10-CM | POA: Diagnosis not present

## 2022-02-17 DIAGNOSIS — R5381 Other malaise: Secondary | ICD-10-CM | POA: Diagnosis not present

## 2022-02-22 DIAGNOSIS — N3000 Acute cystitis without hematuria: Secondary | ICD-10-CM | POA: Diagnosis not present

## 2022-02-22 DIAGNOSIS — Z8673 Personal history of transient ischemic attack (TIA), and cerebral infarction without residual deficits: Secondary | ICD-10-CM | POA: Diagnosis not present

## 2022-02-22 DIAGNOSIS — H409 Unspecified glaucoma: Secondary | ICD-10-CM | POA: Diagnosis not present

## 2022-02-22 DIAGNOSIS — R531 Weakness: Secondary | ICD-10-CM | POA: Diagnosis not present

## 2022-02-22 DIAGNOSIS — R5381 Other malaise: Secondary | ICD-10-CM | POA: Diagnosis not present

## 2022-02-22 DIAGNOSIS — Z87440 Personal history of urinary (tract) infections: Secondary | ICD-10-CM | POA: Diagnosis not present

## 2022-02-22 DIAGNOSIS — I2510 Atherosclerotic heart disease of native coronary artery without angina pectoris: Secondary | ICD-10-CM | POA: Diagnosis not present

## 2022-02-22 DIAGNOSIS — R238 Other skin changes: Secondary | ICD-10-CM | POA: Diagnosis not present

## 2022-02-22 DIAGNOSIS — N390 Urinary tract infection, site not specified: Secondary | ICD-10-CM | POA: Diagnosis not present

## 2022-02-22 DIAGNOSIS — E875 Hyperkalemia: Secondary | ICD-10-CM | POA: Diagnosis not present

## 2022-02-22 DIAGNOSIS — Z1612 Extended spectrum beta lactamase (ESBL) resistance: Secondary | ICD-10-CM | POA: Diagnosis not present

## 2022-02-22 DIAGNOSIS — K219 Gastro-esophageal reflux disease without esophagitis: Secondary | ICD-10-CM | POA: Diagnosis not present

## 2022-02-22 DIAGNOSIS — N1832 Chronic kidney disease, stage 3b: Secondary | ICD-10-CM | POA: Diagnosis not present

## 2022-02-22 DIAGNOSIS — Z9181 History of falling: Secondary | ICD-10-CM | POA: Diagnosis not present

## 2022-02-22 DIAGNOSIS — H3530 Unspecified macular degeneration: Secondary | ICD-10-CM | POA: Diagnosis not present

## 2022-02-22 DIAGNOSIS — E785 Hyperlipidemia, unspecified: Secondary | ICD-10-CM | POA: Diagnosis not present

## 2022-02-22 DIAGNOSIS — I5032 Chronic diastolic (congestive) heart failure: Secondary | ICD-10-CM | POA: Diagnosis not present

## 2022-02-22 DIAGNOSIS — I130 Hypertensive heart and chronic kidney disease with heart failure and stage 1 through stage 4 chronic kidney disease, or unspecified chronic kidney disease: Secondary | ICD-10-CM | POA: Diagnosis not present

## 2022-02-23 ENCOUNTER — Ambulatory Visit (INDEPENDENT_AMBULATORY_CARE_PROVIDER_SITE_OTHER): Payer: Medicare Other | Admitting: Podiatry

## 2022-02-23 DIAGNOSIS — M79674 Pain in right toe(s): Secondary | ICD-10-CM

## 2022-02-23 DIAGNOSIS — B351 Tinea unguium: Secondary | ICD-10-CM | POA: Diagnosis not present

## 2022-02-23 DIAGNOSIS — M79675 Pain in left toe(s): Secondary | ICD-10-CM | POA: Diagnosis not present

## 2022-02-23 DIAGNOSIS — E0843 Diabetes mellitus due to underlying condition with diabetic autonomic (poly)neuropathy: Secondary | ICD-10-CM

## 2022-02-23 DIAGNOSIS — L97512 Non-pressure chronic ulcer of other part of right foot with fat layer exposed: Secondary | ICD-10-CM

## 2022-02-23 NOTE — Progress Notes (Signed)
? ?Subjective:  ?88 y.o. female with PMHx of diabetes mellitus presenting today with her daughter for follow-up routine foot care.  No new complaints at this time.  The wound to the plantar foot is stable.  Patient did recently sustain a fall and sustained contusions to her face.  She was evaluated for this.  They present for further treatment and evaluation ? ?Past Medical History:  ?Diagnosis Date  ? Anemia   ? Arthritis   ? 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 Fall at home 09/26/2012  ? Glaucoma   ? OU  ? Gout, joint   ? Hypertension   ? Hypertensive retinopathy   ? OU  ? Macular degeneration   ? OU  ? NSTEMI (non-ST elevated myocardial infarction) (HCC) 08/2015  ? Stroke [facility])   ? ?Past Surgical History:  ?Procedure Laterality Date  ? ABDOMINAL HYSTERECTOMY    ? BREAST SURGERY    ? CYST  ? CARDIAC CATHETERIZATION N/A 08/18/2015  ? Procedure: Left Heart Cath and Coronary Angiography;  Surgeon: Lennette Bihari, MD;  [facility];  Service: Cardiovascular;  Laterality: N/A;  ? CARDIAC CATHETERIZATION  08/18/2015  ? Procedure: Coronary Stent Intervention;  Surgeon: Lennette Bihari, MD;  Location: [facility];  Service: Cardiovascular;;  ? CATARACT EXTRACTION Bilateral   ? DILATION AND CURETTAGE OF UTERUS    ? EYE SURGERY Bilateral   ? HERNIA REPAIR    ? IRIDOTOMY / IRIDECTOMY Right   ? JOINT REPLACEMENT  left knee  ? REFRACTIVE SURGERY Right 2017  ? ?Allergies  ?Allergen Reactions  ? Meloxicam Hives  ? Nitrofurantoin   ? Other Other (See Comments)  ? Guaifenesin Er Rash  ?  Other reaction(s): rash  ? Nitrofuran Derivatives Rash  ? ?  ? ?Objective/Physical Exam ?General: The patient is alert and oriented x3 in no acute distress. ? ?Dermatology:  ?Wound #1 appears stable.  There is some improvement since last visit.  Wound #1 noted to the right plantar forefoot which continues to measure approximately 0.4x0.5 x 0.2 cm (LxWxD).  Periwound callus  noted. ?To the noted ulceration(s), there is no eschar. There is a moderate amount of slough, fibrin, and necrotic tissue noted. Granulation tissue and wound base is red. There is a minimal amount of serosanguineous drainage noted. There is no exposed bone muscle-tendon ligament or joint. There is no malodor.  ? ?There is also elongated hyperkeratotic dystrophic nails noted 1-5 bilateral. ? ?Vascular: Palpable pedal pulses bilaterally. No edema or erythema noted. Capillary refill within normal limits. ? ?Neurological: Epicritic and protective threshold diminished bilaterally.  ? ?Musculoskeletal Exam: Fat pad atrophy with tenderness to palpation right forefoot.  Palpation of the metatarsal heads noted specifically to the subsecond and third MTPJ ? ?Assessment: ?1.  Ulcer right plantar forefoot secondary to diabetes mellitus ?2. diabetes mellitus w/ peripheral neuropathy ?3.  Pain due to onychomycosis of toenails both ? ? ?Plan of Care:  ?1. Patient was evaluated.   ?2.  Continue conservative treatment ?3.  Medically necessary excisional debridement including subcutaneous tissue was performed using a tissue nipper.  Excisional debridement of all necrotic nonviable tissue down to healthier bleeding viable tissue was performed with postdebridement measurement same as pre- ?4.  Continue diabetic shoes and insoles with offloading felt pads ?5.  Continue gentamicin cream and a Band-Aid daily ?6.  Mechanical debridement of nails 1-5 bilateral was performed using a nail nipper without incident or  bleeding  ?7.  Return to clinic monthly ? ?Felecia Shelling, DPM ?[facility] ? ?Dr. Felecia Shelling, DPM  ?  ?[address]                                       ?[place], [place] [address]                ?Office [phone]  ?Fax [phone] ? ? ? ? ?

## 2022-02-24 DIAGNOSIS — Z87440 Personal history of urinary (tract) infections: Secondary | ICD-10-CM | POA: Diagnosis not present

## 2022-02-24 DIAGNOSIS — E785 Hyperlipidemia, unspecified: Secondary | ICD-10-CM | POA: Diagnosis not present

## 2022-02-24 DIAGNOSIS — K219 Gastro-esophageal reflux disease without esophagitis: Secondary | ICD-10-CM | POA: Diagnosis not present

## 2022-02-24 DIAGNOSIS — Z8673 Personal history of transient ischemic attack (TIA), and cerebral infarction without residual deficits: Secondary | ICD-10-CM | POA: Diagnosis not present

## 2022-02-24 DIAGNOSIS — E875 Hyperkalemia: Secondary | ICD-10-CM | POA: Diagnosis not present

## 2022-02-24 DIAGNOSIS — I2510 Atherosclerotic heart disease of native coronary artery without angina pectoris: Secondary | ICD-10-CM | POA: Diagnosis not present

## 2022-02-24 DIAGNOSIS — N1832 Chronic kidney disease, stage 3b: Secondary | ICD-10-CM | POA: Diagnosis not present

## 2022-02-24 DIAGNOSIS — I130 Hypertensive heart and chronic kidney disease with heart failure and stage 1 through stage 4 chronic kidney disease, or unspecified chronic kidney disease: Secondary | ICD-10-CM | POA: Diagnosis not present

## 2022-02-24 DIAGNOSIS — R531 Weakness: Secondary | ICD-10-CM | POA: Diagnosis not present

## 2022-02-24 DIAGNOSIS — N3000 Acute cystitis without hematuria: Secondary | ICD-10-CM | POA: Diagnosis not present

## 2022-02-24 DIAGNOSIS — H409 Unspecified glaucoma: Secondary | ICD-10-CM | POA: Diagnosis not present

## 2022-02-24 DIAGNOSIS — N390 Urinary tract infection, site not specified: Secondary | ICD-10-CM | POA: Diagnosis not present

## 2022-02-24 DIAGNOSIS — Z1612 Extended spectrum beta lactamase (ESBL) resistance: Secondary | ICD-10-CM | POA: Diagnosis not present

## 2022-02-24 DIAGNOSIS — Z9181 History of falling: Secondary | ICD-10-CM | POA: Diagnosis not present

## 2022-02-24 DIAGNOSIS — I5032 Chronic diastolic (congestive) heart failure: Secondary | ICD-10-CM | POA: Diagnosis not present

## 2022-02-24 DIAGNOSIS — R238 Other skin changes: Secondary | ICD-10-CM | POA: Diagnosis not present

## 2022-02-24 DIAGNOSIS — H3530 Unspecified macular degeneration: Secondary | ICD-10-CM | POA: Diagnosis not present

## 2022-02-24 DIAGNOSIS — R5381 Other malaise: Secondary | ICD-10-CM | POA: Diagnosis not present

## 2022-02-28 DIAGNOSIS — R5381 Other malaise: Secondary | ICD-10-CM | POA: Diagnosis not present

## 2022-02-28 DIAGNOSIS — I2510 Atherosclerotic heart disease of native coronary artery without angina pectoris: Secondary | ICD-10-CM | POA: Diagnosis not present

## 2022-02-28 DIAGNOSIS — I130 Hypertensive heart and chronic kidney disease with heart failure and stage 1 through stage 4 chronic kidney disease, or unspecified chronic kidney disease: Secondary | ICD-10-CM | POA: Diagnosis not present

## 2022-02-28 DIAGNOSIS — N3000 Acute cystitis without hematuria: Secondary | ICD-10-CM | POA: Diagnosis not present

## 2022-02-28 DIAGNOSIS — E875 Hyperkalemia: Secondary | ICD-10-CM | POA: Diagnosis not present

## 2022-02-28 DIAGNOSIS — I5032 Chronic diastolic (congestive) heart failure: Secondary | ICD-10-CM | POA: Diagnosis not present

## 2022-02-28 DIAGNOSIS — H3530 Unspecified macular degeneration: Secondary | ICD-10-CM | POA: Diagnosis not present

## 2022-02-28 DIAGNOSIS — N1832 Chronic kidney disease, stage 3b: Secondary | ICD-10-CM | POA: Diagnosis not present

## 2022-02-28 DIAGNOSIS — Z87440 Personal history of urinary (tract) infections: Secondary | ICD-10-CM | POA: Diagnosis not present

## 2022-02-28 DIAGNOSIS — Z1612 Extended spectrum beta lactamase (ESBL) resistance: Secondary | ICD-10-CM | POA: Diagnosis not present

## 2022-02-28 DIAGNOSIS — H409 Unspecified glaucoma: Secondary | ICD-10-CM | POA: Diagnosis not present

## 2022-02-28 DIAGNOSIS — R531 Weakness: Secondary | ICD-10-CM | POA: Diagnosis not present

## 2022-02-28 DIAGNOSIS — Z9181 History of falling: Secondary | ICD-10-CM | POA: Diagnosis not present

## 2022-02-28 DIAGNOSIS — K219 Gastro-esophageal reflux disease without esophagitis: Secondary | ICD-10-CM | POA: Diagnosis not present

## 2022-02-28 DIAGNOSIS — N390 Urinary tract infection, site not specified: Secondary | ICD-10-CM | POA: Diagnosis not present

## 2022-02-28 DIAGNOSIS — Z8673 Personal history of transient ischemic attack (TIA), and cerebral infarction without residual deficits: Secondary | ICD-10-CM | POA: Diagnosis not present

## 2022-02-28 DIAGNOSIS — R238 Other skin changes: Secondary | ICD-10-CM | POA: Diagnosis not present

## 2022-02-28 DIAGNOSIS — E785 Hyperlipidemia, unspecified: Secondary | ICD-10-CM | POA: Diagnosis not present

## 2022-03-08 DIAGNOSIS — H3530 Unspecified macular degeneration: Secondary | ICD-10-CM | POA: Diagnosis not present

## 2022-03-08 DIAGNOSIS — Z1612 Extended spectrum beta lactamase (ESBL) resistance: Secondary | ICD-10-CM | POA: Diagnosis not present

## 2022-03-08 DIAGNOSIS — N390 Urinary tract infection, site not specified: Secondary | ICD-10-CM | POA: Diagnosis not present

## 2022-03-08 DIAGNOSIS — N1832 Chronic kidney disease, stage 3b: Secondary | ICD-10-CM | POA: Diagnosis not present

## 2022-03-08 DIAGNOSIS — R531 Weakness: Secondary | ICD-10-CM | POA: Diagnosis not present

## 2022-03-08 DIAGNOSIS — E785 Hyperlipidemia, unspecified: Secondary | ICD-10-CM | POA: Diagnosis not present

## 2022-03-08 DIAGNOSIS — Z9181 History of falling: Secondary | ICD-10-CM | POA: Diagnosis not present

## 2022-03-08 DIAGNOSIS — R238 Other skin changes: Secondary | ICD-10-CM | POA: Diagnosis not present

## 2022-03-08 DIAGNOSIS — R5381 Other malaise: Secondary | ICD-10-CM | POA: Diagnosis not present

## 2022-03-08 DIAGNOSIS — H409 Unspecified glaucoma: Secondary | ICD-10-CM | POA: Diagnosis not present

## 2022-03-08 DIAGNOSIS — N3000 Acute cystitis without hematuria: Secondary | ICD-10-CM | POA: Diagnosis not present

## 2022-03-08 DIAGNOSIS — K219 Gastro-esophageal reflux disease without esophagitis: Secondary | ICD-10-CM | POA: Diagnosis not present

## 2022-03-08 DIAGNOSIS — E875 Hyperkalemia: Secondary | ICD-10-CM | POA: Diagnosis not present

## 2022-03-08 DIAGNOSIS — I5032 Chronic diastolic (congestive) heart failure: Secondary | ICD-10-CM | POA: Diagnosis not present

## 2022-03-08 DIAGNOSIS — Z87440 Personal history of urinary (tract) infections: Secondary | ICD-10-CM | POA: Diagnosis not present

## 2022-03-08 DIAGNOSIS — I130 Hypertensive heart and chronic kidney disease with heart failure and stage 1 through stage 4 chronic kidney disease, or unspecified chronic kidney disease: Secondary | ICD-10-CM | POA: Diagnosis not present

## 2022-03-08 DIAGNOSIS — Z8673 Personal history of transient ischemic attack (TIA), and cerebral infarction without residual deficits: Secondary | ICD-10-CM | POA: Diagnosis not present

## 2022-03-08 DIAGNOSIS — I2510 Atherosclerotic heart disease of native coronary artery without angina pectoris: Secondary | ICD-10-CM | POA: Diagnosis not present

## 2022-03-10 DIAGNOSIS — H409 Unspecified glaucoma: Secondary | ICD-10-CM | POA: Diagnosis not present

## 2022-03-10 DIAGNOSIS — I2510 Atherosclerotic heart disease of native coronary artery without angina pectoris: Secondary | ICD-10-CM | POA: Diagnosis not present

## 2022-03-10 DIAGNOSIS — R238 Other skin changes: Secondary | ICD-10-CM | POA: Diagnosis not present

## 2022-03-10 DIAGNOSIS — K219 Gastro-esophageal reflux disease without esophagitis: Secondary | ICD-10-CM | POA: Diagnosis not present

## 2022-03-10 DIAGNOSIS — I5032 Chronic diastolic (congestive) heart failure: Secondary | ICD-10-CM | POA: Diagnosis not present

## 2022-03-10 DIAGNOSIS — E785 Hyperlipidemia, unspecified: Secondary | ICD-10-CM | POA: Diagnosis not present

## 2022-03-10 DIAGNOSIS — R350 Frequency of micturition: Secondary | ICD-10-CM | POA: Diagnosis not present

## 2022-03-10 DIAGNOSIS — Z9181 History of falling: Secondary | ICD-10-CM | POA: Diagnosis not present

## 2022-03-10 DIAGNOSIS — N3000 Acute cystitis without hematuria: Secondary | ICD-10-CM | POA: Diagnosis not present

## 2022-03-10 DIAGNOSIS — I130 Hypertensive heart and chronic kidney disease with heart failure and stage 1 through stage 4 chronic kidney disease, or unspecified chronic kidney disease: Secondary | ICD-10-CM | POA: Diagnosis not present

## 2022-03-10 DIAGNOSIS — N1832 Chronic kidney disease, stage 3b: Secondary | ICD-10-CM | POA: Diagnosis not present

## 2022-03-10 DIAGNOSIS — Z1612 Extended spectrum beta lactamase (ESBL) resistance: Secondary | ICD-10-CM | POA: Diagnosis not present

## 2022-03-10 DIAGNOSIS — R531 Weakness: Secondary | ICD-10-CM | POA: Diagnosis not present

## 2022-03-10 DIAGNOSIS — N390 Urinary tract infection, site not specified: Secondary | ICD-10-CM | POA: Diagnosis not present

## 2022-03-10 DIAGNOSIS — E875 Hyperkalemia: Secondary | ICD-10-CM | POA: Diagnosis not present

## 2022-03-10 DIAGNOSIS — R5381 Other malaise: Secondary | ICD-10-CM | POA: Diagnosis not present

## 2022-03-10 DIAGNOSIS — H3530 Unspecified macular degeneration: Secondary | ICD-10-CM | POA: Diagnosis not present

## 2022-03-10 DIAGNOSIS — Z87440 Personal history of urinary (tract) infections: Secondary | ICD-10-CM | POA: Diagnosis not present

## 2022-03-10 DIAGNOSIS — Z8673 Personal history of transient ischemic attack (TIA), and cerebral infarction without residual deficits: Secondary | ICD-10-CM | POA: Diagnosis not present

## 2022-03-11 DIAGNOSIS — I10 Essential (primary) hypertension: Secondary | ICD-10-CM | POA: Diagnosis not present

## 2022-03-11 DIAGNOSIS — D649 Anemia, unspecified: Secondary | ICD-10-CM | POA: Diagnosis not present

## 2022-03-11 DIAGNOSIS — E785 Hyperlipidemia, unspecified: Secondary | ICD-10-CM | POA: Diagnosis not present

## 2022-03-11 DIAGNOSIS — Z0000 Encounter for general adult medical examination without abnormal findings: Secondary | ICD-10-CM | POA: Diagnosis not present

## 2022-03-11 DIAGNOSIS — E1122 Type 2 diabetes mellitus with diabetic chronic kidney disease: Secondary | ICD-10-CM | POA: Diagnosis not present

## 2022-03-14 DIAGNOSIS — E875 Hyperkalemia: Secondary | ICD-10-CM | POA: Diagnosis not present

## 2022-03-14 DIAGNOSIS — Z8673 Personal history of transient ischemic attack (TIA), and cerebral infarction without residual deficits: Secondary | ICD-10-CM | POA: Diagnosis not present

## 2022-03-14 DIAGNOSIS — H409 Unspecified glaucoma: Secondary | ICD-10-CM | POA: Diagnosis not present

## 2022-03-14 DIAGNOSIS — K219 Gastro-esophageal reflux disease without esophagitis: Secondary | ICD-10-CM | POA: Diagnosis not present

## 2022-03-14 DIAGNOSIS — Z1612 Extended spectrum beta lactamase (ESBL) resistance: Secondary | ICD-10-CM | POA: Diagnosis not present

## 2022-03-14 DIAGNOSIS — H3530 Unspecified macular degeneration: Secondary | ICD-10-CM | POA: Diagnosis not present

## 2022-03-14 DIAGNOSIS — N1832 Chronic kidney disease, stage 3b: Secondary | ICD-10-CM | POA: Diagnosis not present

## 2022-03-14 DIAGNOSIS — E785 Hyperlipidemia, unspecified: Secondary | ICD-10-CM | POA: Diagnosis not present

## 2022-03-14 DIAGNOSIS — N390 Urinary tract infection, site not specified: Secondary | ICD-10-CM | POA: Diagnosis not present

## 2022-03-14 DIAGNOSIS — Z87440 Personal history of urinary (tract) infections: Secondary | ICD-10-CM | POA: Diagnosis not present

## 2022-03-14 DIAGNOSIS — I2510 Atherosclerotic heart disease of native coronary artery without angina pectoris: Secondary | ICD-10-CM | POA: Diagnosis not present

## 2022-03-14 DIAGNOSIS — R531 Weakness: Secondary | ICD-10-CM | POA: Diagnosis not present

## 2022-03-14 DIAGNOSIS — I130 Hypertensive heart and chronic kidney disease with heart failure and stage 1 through stage 4 chronic kidney disease, or unspecified chronic kidney disease: Secondary | ICD-10-CM | POA: Diagnosis not present

## 2022-03-14 DIAGNOSIS — I5032 Chronic diastolic (congestive) heart failure: Secondary | ICD-10-CM | POA: Diagnosis not present

## 2022-03-14 DIAGNOSIS — R238 Other skin changes: Secondary | ICD-10-CM | POA: Diagnosis not present

## 2022-03-14 DIAGNOSIS — N3000 Acute cystitis without hematuria: Secondary | ICD-10-CM | POA: Diagnosis not present

## 2022-03-14 DIAGNOSIS — R5381 Other malaise: Secondary | ICD-10-CM | POA: Diagnosis not present

## 2022-03-14 DIAGNOSIS — Z9181 History of falling: Secondary | ICD-10-CM | POA: Diagnosis not present

## 2022-03-16 DIAGNOSIS — E875 Hyperkalemia: Secondary | ICD-10-CM | POA: Diagnosis not present

## 2022-03-16 DIAGNOSIS — Z1612 Extended spectrum beta lactamase (ESBL) resistance: Secondary | ICD-10-CM | POA: Diagnosis not present

## 2022-03-16 DIAGNOSIS — R5381 Other malaise: Secondary | ICD-10-CM | POA: Diagnosis not present

## 2022-03-16 DIAGNOSIS — H3530 Unspecified macular degeneration: Secondary | ICD-10-CM | POA: Diagnosis not present

## 2022-03-16 DIAGNOSIS — N390 Urinary tract infection, site not specified: Secondary | ICD-10-CM | POA: Diagnosis not present

## 2022-03-16 DIAGNOSIS — E785 Hyperlipidemia, unspecified: Secondary | ICD-10-CM | POA: Diagnosis not present

## 2022-03-16 DIAGNOSIS — Z9181 History of falling: Secondary | ICD-10-CM | POA: Diagnosis not present

## 2022-03-16 DIAGNOSIS — N1832 Chronic kidney disease, stage 3b: Secondary | ICD-10-CM | POA: Diagnosis not present

## 2022-03-16 DIAGNOSIS — I130 Hypertensive heart and chronic kidney disease with heart failure and stage 1 through stage 4 chronic kidney disease, or unspecified chronic kidney disease: Secondary | ICD-10-CM | POA: Diagnosis not present

## 2022-03-16 DIAGNOSIS — H409 Unspecified glaucoma: Secondary | ICD-10-CM | POA: Diagnosis not present

## 2022-03-16 DIAGNOSIS — N3000 Acute cystitis without hematuria: Secondary | ICD-10-CM | POA: Diagnosis not present

## 2022-03-16 DIAGNOSIS — Z87440 Personal history of urinary (tract) infections: Secondary | ICD-10-CM | POA: Diagnosis not present

## 2022-03-16 DIAGNOSIS — I5032 Chronic diastolic (congestive) heart failure: Secondary | ICD-10-CM | POA: Diagnosis not present

## 2022-03-16 DIAGNOSIS — I2510 Atherosclerotic heart disease of native coronary artery without angina pectoris: Secondary | ICD-10-CM | POA: Diagnosis not present

## 2022-03-16 DIAGNOSIS — R238 Other skin changes: Secondary | ICD-10-CM | POA: Diagnosis not present

## 2022-03-16 DIAGNOSIS — Z8673 Personal history of transient ischemic attack (TIA), and cerebral infarction without residual deficits: Secondary | ICD-10-CM | POA: Diagnosis not present

## 2022-03-16 DIAGNOSIS — R531 Weakness: Secondary | ICD-10-CM | POA: Diagnosis not present

## 2022-03-16 DIAGNOSIS — K219 Gastro-esophageal reflux disease without esophagitis: Secondary | ICD-10-CM | POA: Diagnosis not present

## 2022-03-21 DIAGNOSIS — I872 Venous insufficiency (chronic) (peripheral): Secondary | ICD-10-CM | POA: Diagnosis not present

## 2022-03-21 DIAGNOSIS — I10 Essential (primary) hypertension: Secondary | ICD-10-CM | POA: Diagnosis not present

## 2022-03-21 DIAGNOSIS — Z23 Encounter for immunization: Secondary | ICD-10-CM | POA: Diagnosis not present

## 2022-03-21 DIAGNOSIS — E559 Vitamin D deficiency, unspecified: Secondary | ICD-10-CM | POA: Diagnosis not present

## 2022-03-21 DIAGNOSIS — E1122 Type 2 diabetes mellitus with diabetic chronic kidney disease: Secondary | ICD-10-CM | POA: Diagnosis not present

## 2022-03-21 DIAGNOSIS — D509 Iron deficiency anemia, unspecified: Secondary | ICD-10-CM | POA: Diagnosis not present

## 2022-03-21 DIAGNOSIS — E785 Hyperlipidemia, unspecified: Secondary | ICD-10-CM | POA: Diagnosis not present

## 2022-03-21 DIAGNOSIS — I2510 Atherosclerotic heart disease of native coronary artery without angina pectoris: Secondary | ICD-10-CM | POA: Diagnosis not present

## 2022-03-21 DIAGNOSIS — N1831 Chronic kidney disease, stage 3a: Secondary | ICD-10-CM | POA: Diagnosis not present

## 2022-03-21 DIAGNOSIS — N3001 Acute cystitis with hematuria: Secondary | ICD-10-CM | POA: Diagnosis not present

## 2022-03-21 DIAGNOSIS — Z0000 Encounter for general adult medical examination without abnormal findings: Secondary | ICD-10-CM | POA: Diagnosis not present

## 2022-03-21 DIAGNOSIS — D649 Anemia, unspecified: Secondary | ICD-10-CM | POA: Diagnosis not present

## 2022-03-23 ENCOUNTER — Telehealth: Payer: Self-pay | Admitting: *Deleted

## 2022-03-23 ENCOUNTER — Encounter: Payer: Self-pay | Admitting: Podiatry

## 2022-03-23 ENCOUNTER — Ambulatory Visit: Payer: Medicare Other | Admitting: Podiatry

## 2022-03-23 DIAGNOSIS — R5381 Other malaise: Secondary | ICD-10-CM | POA: Diagnosis not present

## 2022-03-23 DIAGNOSIS — L97512 Non-pressure chronic ulcer of other part of right foot with fat layer exposed: Secondary | ICD-10-CM | POA: Diagnosis not present

## 2022-03-23 DIAGNOSIS — Z9181 History of falling: Secondary | ICD-10-CM | POA: Diagnosis not present

## 2022-03-23 DIAGNOSIS — E785 Hyperlipidemia, unspecified: Secondary | ICD-10-CM | POA: Diagnosis not present

## 2022-03-23 DIAGNOSIS — H3530 Unspecified macular degeneration: Secondary | ICD-10-CM | POA: Diagnosis not present

## 2022-03-23 DIAGNOSIS — I5032 Chronic diastolic (congestive) heart failure: Secondary | ICD-10-CM | POA: Diagnosis not present

## 2022-03-23 DIAGNOSIS — R600 Localized edema: Secondary | ICD-10-CM

## 2022-03-23 DIAGNOSIS — N1832 Chronic kidney disease, stage 3b: Secondary | ICD-10-CM | POA: Diagnosis not present

## 2022-03-23 DIAGNOSIS — N3000 Acute cystitis without hematuria: Secondary | ICD-10-CM | POA: Diagnosis not present

## 2022-03-23 DIAGNOSIS — I130 Hypertensive heart and chronic kidney disease with heart failure and stage 1 through stage 4 chronic kidney disease, or unspecified chronic kidney disease: Secondary | ICD-10-CM | POA: Diagnosis not present

## 2022-03-23 DIAGNOSIS — E875 Hyperkalemia: Secondary | ICD-10-CM | POA: Diagnosis not present

## 2022-03-23 DIAGNOSIS — Z1612 Extended spectrum beta lactamase (ESBL) resistance: Secondary | ICD-10-CM | POA: Diagnosis not present

## 2022-03-23 DIAGNOSIS — N390 Urinary tract infection, site not specified: Secondary | ICD-10-CM | POA: Diagnosis not present

## 2022-03-23 DIAGNOSIS — I2510 Atherosclerotic heart disease of native coronary artery without angina pectoris: Secondary | ICD-10-CM | POA: Diagnosis not present

## 2022-03-23 DIAGNOSIS — Z8673 Personal history of transient ischemic attack (TIA), and cerebral infarction without residual deficits: Secondary | ICD-10-CM | POA: Diagnosis not present

## 2022-03-23 DIAGNOSIS — E0843 Diabetes mellitus due to underlying condition with diabetic autonomic (poly)neuropathy: Secondary | ICD-10-CM

## 2022-03-23 DIAGNOSIS — H409 Unspecified glaucoma: Secondary | ICD-10-CM | POA: Diagnosis not present

## 2022-03-23 DIAGNOSIS — K219 Gastro-esophageal reflux disease without esophagitis: Secondary | ICD-10-CM | POA: Diagnosis not present

## 2022-03-23 DIAGNOSIS — R531 Weakness: Secondary | ICD-10-CM | POA: Diagnosis not present

## 2022-03-23 DIAGNOSIS — Z87440 Personal history of urinary (tract) infections: Secondary | ICD-10-CM | POA: Diagnosis not present

## 2022-03-23 DIAGNOSIS — R238 Other skin changes: Secondary | ICD-10-CM | POA: Diagnosis not present

## 2022-03-23 NOTE — Telephone Encounter (Signed)
Okay thank you for the update. - Dr. Logan Bores

## 2022-03-23 NOTE — Telephone Encounter (Signed)
Jasmine December, nurse with [facility] is calling and they do not provide wrappings, according to medicare guidelines, not skilled nursing. They can come out and do a one time change to show patient's daughter.  Faxed [phone] last office notes.

## 2022-03-23 NOTE — Progress Notes (Signed)
Subjective:  88 y.o. female with PMHx of diabetes mellitus presenting today with her daughter for follow-up routine foot care.  Patient states that they have noticed a significant amount of swelling over the past month.  They have been applying wraps to the legs with minimal improvement.  They are planning to have an [facility] home health nurse come today for evaluation.  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)    Fall at home 09/26/2012   Glaucoma    OU   Gout, joint    Hypertension    Hypertensive retinopathy    OU   Macular degeneration    OU   NSTEMI (non-ST elevated myocardial infarction) (HCC) 08/2015   Stroke [facility])    Past Surgical History:  Procedure Laterality Date   ABDOMINAL HYSTERECTOMY     BREAST SURGERY     CYST   CARDIAC CATHETERIZATION N/A 08/18/2015   Procedure: Left Heart Cath and Coronary Angiography;  Surgeon: Lennette Bihari, MD;  [facility];  Service: Cardiovascular;  Laterality: N/A;   CARDIAC CATHETERIZATION  08/18/2015   Procedure: Coronary Stent Intervention;  Surgeon: Lennette Bihari, MD;  [facility];  Service: Cardiovascular;;   CATARACT EXTRACTION Bilateral    DILATION AND CURETTAGE OF UTERUS     EYE SURGERY Bilateral    HERNIA REPAIR     IRIDOTOMY / IRIDECTOMY Right    JOINT REPLACEMENT  left knee   REFRACTIVE SURGERY Right 2017   Allergies  Allergen Reactions   Meloxicam Hives   Nitrofurantoin    Other Other (See Comments)   Guaifenesin Er Rash    Other reaction(s): rash   Nitrofuran Derivatives Rash      Objective/Physical Exam General: The patient is alert and oriented x3 in no acute distress.  Dermatology:  Wound #1 appears stable.  Wound noted to the right plantar forefoot which continues to measure approximately 0.4x0.5 x 0.2 cm (LxWxD).   Periwound callus noted. To the noted ulceration(s), there is no eschar. There is a moderate amount of slough, fibrin, and necrotic tissue noted. Granulation tissue and wound base is red. There is a minimal amount of serosanguineous drainage noted. There is no exposed bone muscle-tendon ligament or joint. There is no malodor.   There is also elongated hyperkeratotic dystrophic nails noted 1-5 bilateral.  Vascular: Palpable pedal pulses bilaterally.  Today there is a significant amount of edema to the bilateral lower extremities  Neurological: Epicritic and protective threshold diminished bilaterally.   Musculoskeletal Exam: Fat pad atrophy with tenderness to palpation right forefoot.  Palpation of the metatarsal heads noted specifically to the subsecond and third MTPJ  Assessment: 1.  Ulcer right plantar forefoot secondary to diabetes mellitus 2. diabetes mellitus w/ peripheral neuropathy 3.  Edema bilateral lower extremities   Plan of Care:  1. Patient was evaluated.   2.  Continue conservative treatment 3.  Medically necessary excisional debridement including subcutaneous tissue was performed using a tissue nipper.  Excisional debridement of all necrotic nonviable tissue down to healthier bleeding viable tissue was performed with postdebridement measurement same as pre- 4.  Continue diabetic shoes and insoles with offloading felt pads 5.  Today multilayer Unna boot compression wraps were applied to the bilateral lower extremities for the edema.  Clean dry and intact x1 week 6.  Patient's daughter states that they are working with [facility] to get nursing orders.  I am more than happy to provide nursing orders however she would like to check with the [facility] home nurse who is coming out today to the house 7.  Return to clinic 4 weeks  Felecia Shelling, DPM [facility]  Dr. Felecia Shelling, DPM    [address] [place], [place] [address]                 Office [phone]  Fax [phone]

## 2022-03-28 NOTE — Telephone Encounter (Signed)
Please advise 

## 2022-04-01 DIAGNOSIS — I2510 Atherosclerotic heart disease of native coronary artery without angina pectoris: Secondary | ICD-10-CM | POA: Diagnosis not present

## 2022-04-01 DIAGNOSIS — Z9181 History of falling: Secondary | ICD-10-CM | POA: Diagnosis not present

## 2022-04-01 DIAGNOSIS — E785 Hyperlipidemia, unspecified: Secondary | ICD-10-CM | POA: Diagnosis not present

## 2022-04-01 DIAGNOSIS — R238 Other skin changes: Secondary | ICD-10-CM | POA: Diagnosis not present

## 2022-04-01 DIAGNOSIS — E875 Hyperkalemia: Secondary | ICD-10-CM | POA: Diagnosis not present

## 2022-04-01 DIAGNOSIS — H3530 Unspecified macular degeneration: Secondary | ICD-10-CM | POA: Diagnosis not present

## 2022-04-01 DIAGNOSIS — N390 Urinary tract infection, site not specified: Secondary | ICD-10-CM | POA: Diagnosis not present

## 2022-04-01 DIAGNOSIS — I130 Hypertensive heart and chronic kidney disease with heart failure and stage 1 through stage 4 chronic kidney disease, or unspecified chronic kidney disease: Secondary | ICD-10-CM | POA: Diagnosis not present

## 2022-04-01 DIAGNOSIS — K219 Gastro-esophageal reflux disease without esophagitis: Secondary | ICD-10-CM | POA: Diagnosis not present

## 2022-04-01 DIAGNOSIS — N1832 Chronic kidney disease, stage 3b: Secondary | ICD-10-CM | POA: Diagnosis not present

## 2022-04-01 DIAGNOSIS — I5032 Chronic diastolic (congestive) heart failure: Secondary | ICD-10-CM | POA: Diagnosis not present

## 2022-04-01 DIAGNOSIS — E11621 Type 2 diabetes mellitus with foot ulcer: Secondary | ICD-10-CM | POA: Diagnosis not present

## 2022-04-01 DIAGNOSIS — H409 Unspecified glaucoma: Secondary | ICD-10-CM | POA: Diagnosis not present

## 2022-04-01 DIAGNOSIS — Z87440 Personal history of urinary (tract) infections: Secondary | ICD-10-CM | POA: Diagnosis not present

## 2022-04-01 DIAGNOSIS — Z8673 Personal history of transient ischemic attack (TIA), and cerebral infarction without residual deficits: Secondary | ICD-10-CM | POA: Diagnosis not present

## 2022-04-04 DIAGNOSIS — M1711 Unilateral primary osteoarthritis, right knee: Secondary | ICD-10-CM | POA: Diagnosis not present

## 2022-04-06 DIAGNOSIS — M75121 Complete rotator cuff tear or rupture of right shoulder, not specified as traumatic: Secondary | ICD-10-CM | POA: Diagnosis not present

## 2022-04-06 DIAGNOSIS — M25512 Pain in left shoulder: Secondary | ICD-10-CM | POA: Diagnosis not present

## 2022-04-06 DIAGNOSIS — M25511 Pain in right shoulder: Secondary | ICD-10-CM | POA: Diagnosis not present

## 2022-04-07 DIAGNOSIS — N1832 Chronic kidney disease, stage 3b: Secondary | ICD-10-CM | POA: Diagnosis not present

## 2022-04-07 DIAGNOSIS — N390 Urinary tract infection, site not specified: Secondary | ICD-10-CM | POA: Diagnosis not present

## 2022-04-07 DIAGNOSIS — Z87440 Personal history of urinary (tract) infections: Secondary | ICD-10-CM | POA: Diagnosis not present

## 2022-04-07 DIAGNOSIS — R350 Frequency of micturition: Secondary | ICD-10-CM | POA: Diagnosis not present

## 2022-04-07 DIAGNOSIS — E785 Hyperlipidemia, unspecified: Secondary | ICD-10-CM | POA: Diagnosis not present

## 2022-04-07 DIAGNOSIS — H409 Unspecified glaucoma: Secondary | ICD-10-CM | POA: Diagnosis not present

## 2022-04-07 DIAGNOSIS — I5032 Chronic diastolic (congestive) heart failure: Secondary | ICD-10-CM | POA: Diagnosis not present

## 2022-04-07 DIAGNOSIS — H3530 Unspecified macular degeneration: Secondary | ICD-10-CM | POA: Diagnosis not present

## 2022-04-07 DIAGNOSIS — R238 Other skin changes: Secondary | ICD-10-CM | POA: Diagnosis not present

## 2022-04-07 DIAGNOSIS — I130 Hypertensive heart and chronic kidney disease with heart failure and stage 1 through stage 4 chronic kidney disease, or unspecified chronic kidney disease: Secondary | ICD-10-CM | POA: Diagnosis not present

## 2022-04-07 DIAGNOSIS — E875 Hyperkalemia: Secondary | ICD-10-CM | POA: Diagnosis not present

## 2022-04-07 DIAGNOSIS — Z9181 History of falling: Secondary | ICD-10-CM | POA: Diagnosis not present

## 2022-04-07 DIAGNOSIS — Z8673 Personal history of transient ischemic attack (TIA), and cerebral infarction without residual deficits: Secondary | ICD-10-CM | POA: Diagnosis not present

## 2022-04-07 DIAGNOSIS — K219 Gastro-esophageal reflux disease without esophagitis: Secondary | ICD-10-CM | POA: Diagnosis not present

## 2022-04-07 DIAGNOSIS — I2510 Atherosclerotic heart disease of native coronary artery without angina pectoris: Secondary | ICD-10-CM | POA: Diagnosis not present

## 2022-04-11 ENCOUNTER — Encounter: Payer: Self-pay | Admitting: Podiatry

## 2022-04-13 DIAGNOSIS — Z87440 Personal history of urinary (tract) infections: Secondary | ICD-10-CM | POA: Diagnosis not present

## 2022-04-13 DIAGNOSIS — N390 Urinary tract infection, site not specified: Secondary | ICD-10-CM | POA: Diagnosis not present

## 2022-04-13 DIAGNOSIS — R238 Other skin changes: Secondary | ICD-10-CM | POA: Diagnosis not present

## 2022-04-13 DIAGNOSIS — H409 Unspecified glaucoma: Secondary | ICD-10-CM | POA: Diagnosis not present

## 2022-04-13 DIAGNOSIS — Z9181 History of falling: Secondary | ICD-10-CM | POA: Diagnosis not present

## 2022-04-13 DIAGNOSIS — Z8673 Personal history of transient ischemic attack (TIA), and cerebral infarction without residual deficits: Secondary | ICD-10-CM | POA: Diagnosis not present

## 2022-04-13 DIAGNOSIS — N1832 Chronic kidney disease, stage 3b: Secondary | ICD-10-CM | POA: Diagnosis not present

## 2022-04-13 DIAGNOSIS — I130 Hypertensive heart and chronic kidney disease with heart failure and stage 1 through stage 4 chronic kidney disease, or unspecified chronic kidney disease: Secondary | ICD-10-CM | POA: Diagnosis not present

## 2022-04-13 DIAGNOSIS — E785 Hyperlipidemia, unspecified: Secondary | ICD-10-CM | POA: Diagnosis not present

## 2022-04-13 DIAGNOSIS — I5032 Chronic diastolic (congestive) heart failure: Secondary | ICD-10-CM | POA: Diagnosis not present

## 2022-04-13 DIAGNOSIS — E875 Hyperkalemia: Secondary | ICD-10-CM | POA: Diagnosis not present

## 2022-04-13 DIAGNOSIS — I2510 Atherosclerotic heart disease of native coronary artery without angina pectoris: Secondary | ICD-10-CM | POA: Diagnosis not present

## 2022-04-13 DIAGNOSIS — H3530 Unspecified macular degeneration: Secondary | ICD-10-CM | POA: Diagnosis not present

## 2022-04-13 DIAGNOSIS — K219 Gastro-esophageal reflux disease without esophagitis: Secondary | ICD-10-CM | POA: Diagnosis not present

## 2022-04-14 NOTE — Telephone Encounter (Signed)
Spoke to patient's daughter Waynetta Sandy. Discontinue unna boot wraps for now.  They will resume their original wraps of kerlex and coban. Has f/u appt with Dr. Allena Katz next week. - Dr. Logan Bores

## 2022-04-15 NOTE — Telephone Encounter (Signed)
Please make sure to reply to this patient. Thank you

## 2022-04-20 DIAGNOSIS — M810 Age-related osteoporosis without current pathological fracture: Secondary | ICD-10-CM | POA: Diagnosis not present

## 2022-04-20 DIAGNOSIS — R8271 Bacteriuria: Secondary | ICD-10-CM | POA: Diagnosis not present

## 2022-04-21 ENCOUNTER — Ambulatory Visit: Payer: Medicare Other | Admitting: Podiatry

## 2022-04-21 ENCOUNTER — Encounter: Payer: Self-pay | Admitting: Podiatry

## 2022-04-21 DIAGNOSIS — R600 Localized edema: Secondary | ICD-10-CM

## 2022-04-21 DIAGNOSIS — L97512 Non-pressure chronic ulcer of other part of right foot with fat layer exposed: Secondary | ICD-10-CM | POA: Diagnosis not present

## 2022-04-22 ENCOUNTER — Ambulatory Visit: Payer: Medicare Other | Admitting: Podiatry

## 2022-04-28 NOTE — Progress Notes (Signed)
   Subjective:  88 y.o. female with PMHx of diabetes mellitus presenting today with her daughter for follow-up routine foot care she states she is doing a lot better the [employer] wrapping has helped considerably.  She denies any other acute complaints.  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)    Fall at home 09/26/2012   Glaucoma    OU   Gout, joint    Hypertension    Hypertensive retinopathy    OU   Macular degeneration    OU   NSTEMI (non-ST elevated myocardial infarction) (HCC) 08/2015   Stroke [facility])    Past Surgical History:  Procedure Laterality Date   ABDOMINAL HYSTERECTOMY     BREAST SURGERY     CYST   CARDIAC CATHETERIZATION N/A 08/18/2015   Procedure: Left Heart Cath and Coronary Angiography;  Surgeon: Lennette Bihari, MD;  [facility];  Service: Cardiovascular;  Laterality: N/A;   CARDIAC CATHETERIZATION  08/18/2015   Procedure: Coronary Stent Intervention;  Surgeon: Lennette Bihari, MD;  [facility];  Service: Cardiovascular;;   CATARACT EXTRACTION Bilateral    DILATION AND CURETTAGE OF UTERUS     EYE SURGERY Bilateral    HERNIA REPAIR     IRIDOTOMY / IRIDECTOMY Right    JOINT REPLACEMENT  left knee   REFRACTIVE SURGERY Right 2017   Allergies  Allergen Reactions   Meloxicam Hives   Nitrofurantoin    Other Other (See Comments)   Guaifenesin Er Rash    Other reaction(s): rash   Nitrofuran Derivatives Rash      Objective/Physical Exam General: The patient is alert and oriented x3 in no acute distress.  Dermatology:  Wound #1 appears stable.  Wound noted to the right plantar forefoot which continues to measure approximately 0.4x0.5 x 0.2 cm (LxWxD).  Periwound callus noted. To the noted ulceration(s), there is no eschar. There is a moderate amount of slough, fibrin, and necrotic tissue noted. Granulation tissue and  wound base is red. There is a minimal amount of serosanguineous drainage noted. There is no exposed bone muscle-tendon ligament or joint. There is no malodor.   There is also elongated hyperkeratotic dystrophic nails noted 1-5 bilateral.  Vascular: Palpable pedal pulses bilaterally.  Today there is a significant amount of edema to the bilateral lower extremities  Neurological: Epicritic and protective threshold diminished bilaterally.   Musculoskeletal Exam: Fat pad atrophy with tenderness to palpation right forefoot.  Palpation of the metatarsal heads noted specifically to the subsecond and third MTPJ  Assessment: 1.  Ulcer right plantar forefoot secondary to diabetes mellitus 2. diabetes mellitus w/ peripheral neuropathy 3.  Edema bilateral lower extremities   Plan of Care:  1. Patient was evaluated.   2.  Continue conservative treatment 3.  Medically necessary excisional debridement including subcutaneous tissue was performed using a tissue nipper.  Excisional debridement of all necrotic nonviable tissue down to healthier bleeding viable tissue was performed with postdebridement measurement same as pre- 4.  Continue diabetic shoes and insoles with offloading felt pads 5.  Today multilayer Unna boot compression wraps were applied to the bilateral lower extremities for the edema.  Clean dry and intact x1 week 6.  Home nurse has been doing the dressing change. 7.  Return to clinic 4 weeks

## 2022-05-02 DIAGNOSIS — H04123 Dry eye syndrome of bilateral lacrimal glands: Secondary | ICD-10-CM | POA: Diagnosis not present

## 2022-05-02 DIAGNOSIS — H401113 Primary open-angle glaucoma, right eye, severe stage: Secondary | ICD-10-CM | POA: Diagnosis not present

## 2022-05-02 DIAGNOSIS — H20011 Primary iridocyclitis, right eye: Secondary | ICD-10-CM | POA: Diagnosis not present

## 2022-05-02 DIAGNOSIS — H353131 Nonexudative age-related macular degeneration, bilateral, early dry stage: Secondary | ICD-10-CM | POA: Diagnosis not present

## 2022-05-02 DIAGNOSIS — H40012 Open angle with borderline findings, low risk, left eye: Secondary | ICD-10-CM | POA: Diagnosis not present

## 2022-05-02 DIAGNOSIS — Z961 Presence of intraocular lens: Secondary | ICD-10-CM | POA: Diagnosis not present

## 2022-05-02 DIAGNOSIS — H34811 Central retinal vein occlusion, right eye, with macular edema: Secondary | ICD-10-CM | POA: Diagnosis not present

## 2022-05-05 DIAGNOSIS — R350 Frequency of micturition: Secondary | ICD-10-CM | POA: Diagnosis not present

## 2022-05-11 DIAGNOSIS — E11621 Type 2 diabetes mellitus with foot ulcer: Secondary | ICD-10-CM | POA: Diagnosis not present

## 2022-05-16 ENCOUNTER — Ambulatory Visit: Payer: Medicare Other | Admitting: Podiatry

## 2022-05-16 DIAGNOSIS — L97512 Non-pressure chronic ulcer of other part of right foot with fat layer exposed: Secondary | ICD-10-CM

## 2022-05-16 DIAGNOSIS — M79675 Pain in left toe(s): Secondary | ICD-10-CM

## 2022-05-16 DIAGNOSIS — B351 Tinea unguium: Secondary | ICD-10-CM | POA: Diagnosis not present

## 2022-05-16 DIAGNOSIS — M79674 Pain in right toe(s): Secondary | ICD-10-CM | POA: Diagnosis not present

## 2022-05-16 NOTE — Progress Notes (Signed)
Subjective:  88 y.o. female with PMHx of diabetes mellitus presenting today with her daughter for follow-up routine foot care.  Unna boot wraps are intact today.  Patient continues to have pain to the ulcer site.  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)    Fall at home 09/26/2012   Glaucoma    OU   Gout, joint    Hypertension    Hypertensive retinopathy    OU   Macular degeneration    OU   NSTEMI (non-ST elevated myocardial infarction) (HCC) 08/2015   Stroke [facility])    Past Surgical History:  Procedure Laterality Date   ABDOMINAL HYSTERECTOMY     BREAST SURGERY     CYST   CARDIAC CATHETERIZATION N/A 08/18/2015   Procedure: Left Heart Cath and Coronary Angiography;  Surgeon: Lennette Bihari, MD;  [facility];  Service: Cardiovascular;  Laterality: N/A;   CARDIAC CATHETERIZATION  08/18/2015   Procedure: Coronary Stent Intervention;  Surgeon: Lennette Bihari, MD;  [facility];  Service: Cardiovascular;;   CATARACT EXTRACTION Bilateral    DILATION AND CURETTAGE OF UTERUS     EYE SURGERY Bilateral    HERNIA REPAIR     IRIDOTOMY / IRIDECTOMY Right    JOINT REPLACEMENT  left knee   REFRACTIVE SURGERY Right 2017   Allergies  Allergen Reactions   Meloxicam Hives   Nitrofurantoin    Other Other (See Comments)   Guaifenesin Er Rash    Other reaction(s): rash   Nitrofuran Derivatives Rash      Objective/Physical Exam General: The patient is alert and oriented x3 in no acute distress.  Dermatology:  Wound #1 appears stable.  There continues to be a wound noted to the right plantar forefoot which continues to measure approximately 0.4x0.5 x 0.2 cm (LxWxD).  Periwound callus noted. To the noted ulceration(s), there is no eschar. There is a moderate amount of slough, fibrin, and necrotic tissue noted.  Granulation tissue and wound base is red. There is a minimal amount of serosanguineous drainage noted. There is no exposed bone muscle-tendon ligament or joint. There is no malodor.   There is also elongated hyperkeratotic dystrophic nails noted 1-5 bilateral.  Vascular: Palpable pedal pulses bilaterally.  Today there is a significant amount of edema to the bilateral lower extremities  Neurological: Epicritic and protective threshold diminished bilaterally.   Musculoskeletal Exam: Fat pad atrophy with tenderness to palpation right forefoot.  Palpation of the metatarsal heads noted specifically to the subsecond and third MTPJ  Assessment: 1.  Ulcer right plantar forefoot secondary to diabetes mellitus 2. diabetes mellitus w/ peripheral neuropathy 3.  Edema bilateral lower extremities   Plan of Care:  1. Patient was evaluated.  Continue conservative treatment 2.  Unna boot compression wraps were left intact today 3.  Medically necessary excisional debridement including subcutaneous tissue was performed using a tissue nipper.  Excisional debridement of all necrotic nonviable tissue down to healthier bleeding viable tissue was performed with postdebridement measurement same as pre- 4.  Continue diabetic shoes and insoles with offloading felt pads.  The felt pads were adjusted today 5.  Continue home Unna boot wraps applied by the family and supplies ordered through  [facility] 6.  Mechanical debridement of nails 1-5 bilateral was performed using nail nipper without incident or bleeding  7.  Return to clinic monthly  Felecia Shelling, DPM [facility]  Dr. Felecia Shelling, DPM    [address] [place], [place] [address]                Office [phone]  Fax [phone]

## 2022-05-19 NOTE — Progress Notes (Signed)
[facility] Note  06/02/2022    CHIEF COMPLAINT Patient presents for Retina Follow Up  HISTORY OF PRESENT ILLNESS: [name] is a 88 y.o. female who presents to the clinic today for:  HPI     Retina Follow Up   Patient presents with  CRVO/BRVO.  In right eye.  Severity is moderate.  Duration of 4 months.  Since onset it is gradually worsening.  I, the attending physician,  performed the HPI with the patient and updated documentation appropriately.        Comments   Pt here for 4 mo ret f/u BRVO OD. Pt states VA has worsened, feels most of the vision in OD is gone now. Reports just seeing mostly light in OD now.       Last edited by Bernarda Caffey, MD on 06/03/2022  9:34 AM.    Pts daughter states pt saw Dr. Katy Fitch last month and she was not able to see anything, daughter thought pt had lost all vision in that eye, but states this morning she was able to count fingers here, Dr. Katy Fitch did not change any drops, pt denies eye pain  Referring physician: [name], DO [place],  [place] [id number]  HISTORICAL INFORMATION:   Selected notes from the MEDICAL RECORD NUMBER Referred by Dr. Midge Aver for concern of RVO OS   CURRENT MEDICATIONS: Current Outpatient Medications (Ophthalmic Drugs)  Medication Sig   brimonidine (ALPHAGAN) 0.2 % ophthalmic solution Place 1 drop into both eyes in the morning and at bedtime.   LOTEMAX SM 0.38 % GEL INSTILL 1 DROP SIX TIMES A DAY IN RIGHT EYE. (Patient taking differently: Place 1 drop into the right eye See admin instructions. Instill 1 drop six times a day in right eye.)   Polyethyl Glycol-Propyl Glycol (SYSTANE OP) Apply 1 drop to eye 2 (two) times daily as needed (dry eyes).   sodium chloride (MURO 128) 5 % ophthalmic ointment Place 1 application into the right eye at bedtime.   sodium chloride (MURO 128) 5 % ophthalmic solution INSTILL 1 DROP INTO RIGHT EYE 4 TIMES A DAY   timolol  (TIMOPTIC) 0.5 % ophthalmic solution Place 1 drop into both eyes every morning.   No current facility-administered medications for this visit. (Ophthalmic Drugs)   Current Outpatient Medications (Other)  Medication Sig   Acetaminophen 325 MG CAPS Take 325 mg by mouth 3 (three) times daily as needed.   aspirin 81 MG chewable tablet Chew 81 mg by mouth daily.   atorvastatin (LIPITOR) 80 MG tablet Take 1 tablet (80 mg total) by mouth daily at 6 PM.   Calcium Carbonate-Vitamin D (CALTRATE 600+D PO) Take 1 tablet by mouth daily.   chlorpheniramine (CHLOR-TRIMETON) 4 MG tablet Take 4 mg by mouth daily.   denosumab (PROLIA) 60 MG/ML SOSY injection 60 mg See admin instructions.   famotidine (PEPCID) 20 MG tablet Take 20 mg by mouth daily.   FLUZONE HIGH-DOSE QUADRIVALENT 0.7 ML SUSY    furosemide (LASIX) 40 MG tablet Take 20 mg by mouth daily.   GEMTESA 75 MG TABS Take 75 mg by mouth daily.   gentamicin cream (GARAMYCIN) 0.1 % Apply 1 application topically 2 (two) times daily.   metoprolol (LOPRESSOR) 50 MG tablet Take 25 mg by mouth 2 (two) times daily.   PFIZER-BIONT COVID-19 VAC-TRIS SUSP injection    amLODipine (NORVASC) 2.5 MG tablet Take 1 tablet (2.5 mg total) by mouth  daily.   No current facility-administered medications for this visit. (Other)   REVIEW OF SYSTEMS: ROS   Positive for: Cardiovascular, Eyes, Respiratory Negative for: Constitutional, Gastrointestinal, Neurological, Skin, Genitourinary, Musculoskeletal, HENT, Endocrine, Psychiatric, Allergic/Imm, Heme/Lymph Last edited by Kingsley Spittle, COT on 06/02/2022  9:15 AM.     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 ([place])    Fall at home 09/26/2012   Glaucoma    OU   Gout, joint    Hypertension    Hypertensive retinopathy    OU   Macular degeneration    OU   NSTEMI (non-ST elevated myocardial infarction) ([place]) 08/2015   Stroke [facility])    Past Surgical History:  Procedure Laterality Date   ABDOMINAL HYSTERECTOMY     BREAST SURGERY     CYST   CARDIAC CATHETERIZATION N/A 08/18/2015   Procedure: Left Heart Cath and Coronary Angiography;  Surgeon: Troy Sine, MD;  Location: [facility];  Service: Cardiovascular;  Laterality: N/A;   CARDIAC CATHETERIZATION  08/18/2015   Procedure: Coronary Stent Intervention;  Surgeon: Troy Sine, MD;  Location: [facility];  Service: Cardiovascular;;   CATARACT EXTRACTION Bilateral    DILATION AND CURETTAGE OF UTERUS     EYE SURGERY Bilateral    HERNIA REPAIR     IRIDOTOMY / IRIDECTOMY Right    JOINT REPLACEMENT  left knee   REFRACTIVE SURGERY Right 2017   FAMILY HISTORY Family History  Problem Relation Age of Onset   Heart disease Mother    Stroke Mother    SOCIAL HISTORY Social History   Tobacco Use   Smoking status: Never   Smokeless tobacco: Never  Vaping Use   Vaping Use: Never used  Substance Use Topics   Alcohol use: No    Alcohol/week: 0.0 standard drinks of alcohol   Drug use: No       OPHTHALMIC EXAM:  Base Eye Exam     Visual Acuity (Snellen - Linear)       Right Left   Dist [place] CF @ 6in 20/30 -2   Dist ph  NI 20/30 +2         Tonometry (Tonopen, 9:21 AM)       Right Left   Pressure 9 13         Pupils       Dark Light Shape React APD   Right 3 3 Round NR None   Left 3 2 Round Brisk None         Visual Fields (Counting fingers)       Left Right    Full    Restrictions  Total superior temporal, inferior temporal, inferior nasal deficiencies; Partial inner superior nasal deficiency         Extraocular Movement       Right Left    Full, Ortho Full, Ortho          Neuro/Psych     Oriented x3: Yes   Mood/Affect: Normal         Dilation     Both eyes: 1.0% Mydriacyl, 2.5% Phenylephrine @ 9:21 AM  Slit Lamp and Fundus Exam     External Exam       Right Left   External  left sided facial ecchymosis         Slit Lamp Exam       Right Left   Lids/Lashes Dermatochalasis - upper lid, trace periorbital swelling Dermatochalasis - upper lid, mild Meibomian gland dysfunction   Conjunctiva/Sclera Trace Injection White and quiet   Cornea Arcus, 3+ edema w/ Descemet's folds, well healed temporal cataract wounds, 2+ fine endo pigment, 2-3+ Punctate epithelial erosions Arcus, 2+fine Punctate epithelial erosions, Well healed cataract wounds, tear film debris   Anterior Chamber Deep and clear, No cells or pigment Deep and quiet   Iris Round and dilated, No NVI Round and dilated   Lens PC IOL in good position with open PC PC IOL in good position with open PC   Anterior Vitreous Vitreous syneresis, +RBC, mild diffuse VH, vitreous condensations, +pigment Vitreous syneresis, Posterior vitreous detachment         Fundus Exam       Right Left   Disc Hazy view, 3-4+pallor, fully cupped, thin inf rim, 360 PPA, Sharp rim 1+ Pallor, Sharp rim, +cupping, temporal Peripapillary atrophy   C/D Ratio 0.99 0.6   Macula Hazy view -- grossly flat, central pigment clump, mild fibrosis, scattered DBH inferiorly, +edema Flat, good foveal reflex, RPE mottling and clumping, drusen, No heme or edema   Vessels attenuated, Tortuous attenuated, Tortuous   Periphery Hazy view, grossly attached, obscured by corneal edema Attached, No heme            IMAGING AND PROCEDURES  Imaging and Procedures for _0 @  OCT, Retina - OU - Both Eyes       Right Eye Quality was poor. Progression has worsened. Findings include no SRF, abnormal foveal contour, subretinal hyper-reflective material, intraretinal fluid, inner retinal atrophy, outer retinal atrophy (Very  poor image quality, retina attached with persistent retinal thickening / edema).   Left Eye Quality was borderline. Central Foveal Thickness: 256. Progression has been stable. Findings include normal foveal contour, no IRF, no SRF, retinal drusen , outer retinal atrophy (stable improvement in focal IRF SN fovea, patchy ORA).   Notes *Images captured and stored on drive  Diagnosis / Impression:  OD: Very poor image quality, retina attached with persistent retinal thickening / edema OS: NFP, no SRF, +drusen -- stable improvement of focal IRF SN fovea, patchy ORA  Clinical management:  See below  Abbreviations: NFP - Normal foveal profile. CME - cystoid macular edema. PED - pigment epithelial detachment. IRF - intraretinal fluid. SRF - subretinal fluid. EZ - ellipsoid zone. ERM - epiretinal membrane. ORA - outer retinal atrophy. ORT - outer retinal tubulation. SRHM - subretinal hyper-reflective material            ASSESSMENT/PLAN:    ICD-10-CM   1. Branch retinal vein occlusion of right eye with macular edema  H34.8310 OCT, Retina - OU - Both Eyes    2. Vitreous hemorrhage of right eye ([place])  H43.11     3. Exudative age-related macular degeneration of right eye with active choroidal neovascularization ([place])  H35.3211     4. Intermediate stage nonexudative age-related macular degeneration of left eye  H35.3122     5. Essential hypertension  I10     6. Hypertensive retinopathy of both eyes  H35.033     7. Primary open angle glaucoma (POAG) of both eyes, severe stage  E70.3500  8. Pseudophakia of both eyes  Z96.1     9. Dry eyes, bilateral  H04.123     10. Corneal edema  H18.20      1,2. Inferior HRVO w/ CME and VH, OD  - pt lost to f/u from 10/16/2019 to 07/14/2020 (9 mos)  - presenting BCVA CF 2' (09.14.20)  - pt subjectively reports decline in vision OD since Feb 2020  - presenting OCT shows severe CME/IRF temporal macula, +SRF overlying low PED (?exudative ARMD  component)  - S/p IVA OD #1 09.14.20, #2 (10.20.20), #3 (12.02.20), #4 (01.06.21), #5 (10.05.21), #6 (11.03.21), #7 (12.01.21), #8 (12.29.22), #9 (04.13.22), #10 (05.11.22), #11 (06.16.22), #12 (07.21.22)  - OCT today shows poor image quality, retina attached with diffuse atrophy + central edema  - BCVA CF at 6"  - discussed findings, poor prognosis and treatment options   - recommend holding IVA OD today  - f/u 4 mos, DFE, OCT  3. Exudative age related macular degeneration, OD    - initial OCT showed +IRF/SRF -- SRF overlying low lying PED  - original exam shows +CNVM with surrounding heme --stably improved today  - suspect exudative ARMD component complicating HRVO w/ CME  - S/p IVA OD #1 (09.14.20), #2 (10.20.20), #3 (12.02.20), #4 (01.06.21), #5 (10.05.21), #6 (11.03.21), #7 (12.01.21), #8 (12.29.22), #9 (04.13.22), #10 (05.11.22), #11 (06.16.22), #12 (07.12.22)  - recommend  holding IVA OD today as above   - f/u 4 months -- DFE/OCT, possible injection  4. Age related macular degeneration, non-exudative, OS  - OCT shows stable improvement of focal cystic changes  - no heme or edema visible on exam  - recommend close observation   - cont Amsler grid monitoring   - f/u 4 mos  5,6. Hypertensive retinopathy OU  - discussed importance of tight BP control  - monitor   7. POAG OU -- severe stage  - under the expert management of Dr. Midge Aver  - s/p SLT OD 3.23.2017  - s/p TDC OD w/ [name], 3.30.21  - IOP 9,13  - Brimonidine BID OU   - timolol QAM per Dr. Katy Fitch  - monitor   8. Pseudophakia OU  - s/p CE/IOL OU  - beautiful surgeries, doing well  - monitor   9. Dry eyes OU (OD > OS)  - recommend artificial tears and lubricating ointment as needed  10. Corneal edema OD  - 3+ Descemet folds and mild haze   - recommend treatment to improve monitoring of retina and posterior pole   - cont Lotemax SM OD -- decrease to BID  - cont Muro 128 gtts QID OD  - cont Muro 128 ung  qhs OD  - monitor    Ophthalmic Meds Ordered this visit:  No orders of the defined types were placed in this encounter.    Return in about 4 months (around 10/02/2022) for f/u BRVO OD, DFE, OCT.  There are no Patient Instructions on file for this visit.   Explained the diagnoses, plan, and follow up with the patient and they expressed understanding.  Patient expressed understanding of the importance of proper follow up care.   This document serves as a record of services personally performed by Gardiner Sleeper, MD, PhD. It was created on their behalf by San Jetty. Owens Shark, OA an ophthalmic technician. The creation of this record is the provider's dictation and/or activities during the visit.    Electronically signed by: San Jetty. Owens Shark, [place] 08.10.2023 9:36 AM  Gardiner Sleeper, M.D., Ph.D. Diseases & Surgery of the Retina and Vitreous [facility] [facility]  I have reviewed the above documentation for accuracy and completeness, and I agree with the above. Gardiner Sleeper, M.D., Ph.D. 06/03/22 9:39 AM   Abbreviations: M myopia (nearsighted); A astigmatism; H hyperopia (farsighted); P presbyopia; Mrx spectacle prescription;  CTL contact lenses; OD right eye; OS left eye; OU both eyes  XT exotropia; ET esotropia; PEK punctate epithelial keratitis; PEE punctate epithelial erosions; DES dry eye syndrome; MGD meibomian gland dysfunction; ATs artificial tears; PFAT's preservative free artificial tears; [place] nuclear sclerotic cataract; PSC posterior subcapsular cataract; ERM epi-retinal membrane; PVD posterior vitreous detachment; RD retinal detachment; DM diabetes mellitus; DR diabetic retinopathy; NPDR non-proliferative diabetic retinopathy; PDR proliferative diabetic retinopathy; CSME clinically significant macular edema; DME diabetic macular edema; dbh dot blot hemorrhages; CWS cotton wool spot; POAG primary open angle glaucoma; C/D cup-to-disc ratio; HVF humphrey visual field; GVF  goldmann visual field; OCT optical coherence tomography; IOP intraocular pressure; BRVO Branch retinal vein occlusion; CRVO central retinal vein occlusion; CRAO central retinal artery occlusion; BRAO branch retinal artery occlusion; RT retinal tear; SB scleral buckle; PPV pars plana vitrectomy; VH Vitreous hemorrhage; PRP panretinal laser photocoagulation; IVK intravitreal kenalog; VMT vitreomacular traction; MH Macular hole;  NVD neovascularization of the disc; NVE neovascularization elsewhere; AREDS age related eye disease study; ARMD age related macular degeneration; POAG primary open angle glaucoma; EBMD epithelial/anterior basement membrane dystrophy; ACIOL anterior chamber intraocular lens; IOL intraocular lens; PCIOL posterior chamber intraocular lens; Phaco/IOL phacoemulsification with intraocular lens placement; [place] photorefractive keratectomy; LASIK laser assisted in situ keratomileusis; HTN hypertension; DM diabetes mellitus; COPD chronic obstructive pulmonary disease

## 2022-05-31 ENCOUNTER — Telehealth: Payer: Self-pay

## 2022-05-31 NOTE — Telephone Encounter (Signed)
This message has been routed to the appropriate staff. 

## 2022-05-31 NOTE — Telephone Encounter (Signed)
Paula Number, do I need to fill out a completely new form for this patient or can you order from the prior order form that was faxed originally? Dr. Logan Bores

## 2022-06-02 ENCOUNTER — Ambulatory Visit (INDEPENDENT_AMBULATORY_CARE_PROVIDER_SITE_OTHER): Payer: Medicare Other | Admitting: Ophthalmology

## 2022-06-02 ENCOUNTER — Encounter (INDEPENDENT_AMBULATORY_CARE_PROVIDER_SITE_OTHER): Payer: Self-pay | Admitting: Ophthalmology

## 2022-06-02 DIAGNOSIS — H34831 Tributary (branch) retinal vein occlusion, right eye, with macular edema: Secondary | ICD-10-CM | POA: Diagnosis not present

## 2022-06-02 DIAGNOSIS — H353122 Nonexudative age-related macular degeneration, left eye, intermediate dry stage: Secondary | ICD-10-CM

## 2022-06-02 DIAGNOSIS — H353211 Exudative age-related macular degeneration, right eye, with active choroidal neovascularization: Secondary | ICD-10-CM | POA: Diagnosis not present

## 2022-06-02 DIAGNOSIS — I10 Essential (primary) hypertension: Secondary | ICD-10-CM

## 2022-06-02 DIAGNOSIS — H35033 Hypertensive retinopathy, bilateral: Secondary | ICD-10-CM

## 2022-06-02 DIAGNOSIS — H401133 Primary open-angle glaucoma, bilateral, severe stage: Secondary | ICD-10-CM

## 2022-06-02 DIAGNOSIS — R350 Frequency of micturition: Secondary | ICD-10-CM | POA: Diagnosis not present

## 2022-06-02 DIAGNOSIS — H4311 Vitreous hemorrhage, right eye: Secondary | ICD-10-CM

## 2022-06-02 DIAGNOSIS — H1820 Unspecified corneal edema: Secondary | ICD-10-CM

## 2022-06-02 DIAGNOSIS — H04123 Dry eye syndrome of bilateral lacrimal glands: Secondary | ICD-10-CM

## 2022-06-02 DIAGNOSIS — Z961 Presence of intraocular lens: Secondary | ICD-10-CM

## 2022-06-03 ENCOUNTER — Encounter (INDEPENDENT_AMBULATORY_CARE_PROVIDER_SITE_OTHER): Payer: Self-pay | Admitting: Ophthalmology

## 2022-06-07 NOTE — Telephone Encounter (Signed)
New order form needs to be completed w/ double supplies request, signed by physician.

## 2022-06-14 DIAGNOSIS — E11621 Type 2 diabetes mellitus with foot ulcer: Secondary | ICD-10-CM | POA: Diagnosis not present

## 2022-06-15 ENCOUNTER — Ambulatory Visit: Payer: Medicare Other | Admitting: Podiatry

## 2022-06-15 DIAGNOSIS — L97512 Non-pressure chronic ulcer of other part of right foot with fat layer exposed: Secondary | ICD-10-CM | POA: Diagnosis not present

## 2022-06-15 DIAGNOSIS — R600 Localized edema: Secondary | ICD-10-CM | POA: Diagnosis not present

## 2022-06-15 NOTE — Progress Notes (Signed)
Chief Complaint  Patient presents with   Diabetic Ulcer    Right foot ulcer, Una boot bilateral, right foot is having some pain, hard to walk on,     Subjective:  88 y.o. female with PMHx of diabetes mellitus presenting today with her daughter for follow-up routine foot care and for evaluation of chronic ulcers to the plantar aspect of the right forefoot.  They have been applying the Unna boot's at home which have helped with the lower extremity edema.  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)    Fall at home 09/26/2012   Glaucoma    OU   Gout, joint    Hypertension    Hypertensive retinopathy    OU   Macular degeneration    OU   NSTEMI (non-ST elevated myocardial infarction) ([facility]) 08/2015   Stroke [facility])    Past Surgical History:  Procedure Laterality Date   ABDOMINAL HYSTERECTOMY     BREAST SURGERY     CYST   CARDIAC CATHETERIZATION N/A 08/18/2015   Procedure: Left Heart Cath and Coronary Angiography;  Surgeon: Lennette Bihari, MD;  [facility];  Service: Cardiovascular;  Laterality: N/A;   CARDIAC CATHETERIZATION  08/18/2015   Procedure: Coronary Stent Intervention;  Surgeon: Lennette Bihari, MD;  Location: [facility];  Service: Cardiovascular;;   CATARACT EXTRACTION Bilateral    DILATION AND CURETTAGE OF UTERUS     EYE SURGERY Bilateral    HERNIA REPAIR     IRIDOTOMY / IRIDECTOMY Right    JOINT REPLACEMENT  left knee   REFRACTIVE SURGERY Right 2017   Allergies  Allergen Reactions   Meloxicam Hives   Nitrofurantoin    Other Other (See Comments)   Guaifenesin Er Rash    Other reaction(s): rash   Nitrofuran Derivatives Rash      Objective/Physical Exam General: The patient is alert and oriented x3 in no acute distress.  Dermatology:  Wound #1 appears stable.  There continues to be a wound noted to the right plantar forefoot which  continues to measure approximately 0.3 x 0.3 x 0.1 cm (LxWxD).  Periwound callus noted. To the noted ulceration(s), there is no eschar. There is a moderate amount of slough, fibrin, and necrotic tissue noted. Granulation tissue and wound base is red. There is a minimal amount of serosanguineous drainage noted. There is no exposed bone muscle-tendon ligament or joint. There is no malodor.   Vascular: Palpable pedal pulses bilaterally.  Chronic bilateral lower extremity edema noted  Neurological: Epicritic and protective threshold diminished bilaterally.   Musculoskeletal Exam: Fat pad atrophy with tenderness to palpation right forefoot.  Palpation of the metatarsal heads noted specifically to the subsecond and third MTPJ  Assessment: 1.  Ulcer right plantar forefoot secondary to diabetes mellitus 2. diabetes mellitus w/ peripheral neuropathy 3.  Edema bilateral lower extremities   Plan of Care:  1. Patient was evaluated.  Continue conservative treatment 2.  Unna boot compression wraps were applied to the bilateral lower extremities today 3.  Medically necessary excisional debridement including subcutaneous tissue was performed using a tissue nipper.  Excisional debridement of all necrotic nonviable tissue down to healthier bleeding viable tissue was performed with postdebridement measurement same as pre- 4.  Continue diabetic shoes and insoles with offloading felt pads.  The felt pads were adjusted today 5.  Continue home [employer]  boot wraps at home 6.  Order placed for supplies at  [facility] for unna boot application weekly. 7.  Return to clinic monthly  Felecia Shelling, DPM [facility]  Dr. Felecia Shelling, DPM    [address] [place], [place] [address]                Office [phone]  Fax [phone]

## 2022-06-17 NOTE — Telephone Encounter (Signed)
New supply orders were ordered at the patient's last office visit.  Thanks, Dr. Logan Bores

## 2022-06-20 NOTE — Telephone Encounter (Signed)
Last office visit 06/15/22

## 2022-06-21 ENCOUNTER — Telehealth: Payer: Self-pay

## 2022-06-21 DIAGNOSIS — E11621 Type 2 diabetes mellitus with foot ulcer: Secondary | ICD-10-CM | POA: Diagnosis not present

## 2022-06-21 NOTE — Telephone Encounter (Signed)
Encounter created in error

## 2022-06-25 ENCOUNTER — Other Ambulatory Visit (INDEPENDENT_AMBULATORY_CARE_PROVIDER_SITE_OTHER): Payer: Self-pay | Admitting: Ophthalmology

## 2022-06-25 DIAGNOSIS — H1820 Unspecified corneal edema: Secondary | ICD-10-CM

## 2022-06-28 ENCOUNTER — Other Ambulatory Visit (INDEPENDENT_AMBULATORY_CARE_PROVIDER_SITE_OTHER): Payer: Self-pay | Admitting: Ophthalmology

## 2022-06-30 DIAGNOSIS — R350 Frequency of micturition: Secondary | ICD-10-CM | POA: Diagnosis not present

## 2022-07-07 DIAGNOSIS — M25511 Pain in right shoulder: Secondary | ICD-10-CM | POA: Diagnosis not present

## 2022-07-07 DIAGNOSIS — M75121 Complete rotator cuff tear or rupture of right shoulder, not specified as traumatic: Secondary | ICD-10-CM | POA: Diagnosis not present

## 2022-07-07 DIAGNOSIS — M25512 Pain in left shoulder: Secondary | ICD-10-CM | POA: Diagnosis not present

## 2022-07-13 ENCOUNTER — Ambulatory Visit: Payer: Medicare Other | Admitting: Podiatry

## 2022-07-13 DIAGNOSIS — L97512 Non-pressure chronic ulcer of other part of right foot with fat layer exposed: Secondary | ICD-10-CM

## 2022-07-13 DIAGNOSIS — R600 Localized edema: Secondary | ICD-10-CM | POA: Diagnosis not present

## 2022-07-13 NOTE — Progress Notes (Signed)
Chief Complaint  Patient presents with   wound care    Patient is here for right foot ulcer, seems to be painful.Patient has a new spot on great toe on left foot that was noticed.    Subjective:  88 y.o. female with PMHx of diabetes mellitus presenting today with her daughter for follow-up routine foot care and for evaluation of chronic ulcers to the plantar aspect of the right forefoot.  They have been applying the Unna boot's at home which have helped with the lower extremity edema.  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)    Fall at home 09/26/2012   Glaucoma    OU   Gout, joint    Hypertension    Hypertensive retinopathy    OU   Macular degeneration    OU   NSTEMI (non-ST elevated myocardial infarction) ([facility]) 08/2015   Stroke [facility])    Past Surgical History:  Procedure Laterality Date   ABDOMINAL HYSTERECTOMY     BREAST SURGERY     CYST   CARDIAC CATHETERIZATION N/A 08/18/2015   Procedure: Left Heart Cath and Coronary Angiography;  Surgeon: Lennette Bihari, MD;  [facility];  Service: Cardiovascular;  Laterality: N/A;   CARDIAC CATHETERIZATION  08/18/2015   Procedure: Coronary Stent Intervention;  Surgeon: Lennette Bihari, MD;  [facility];  Service: Cardiovascular;;   CATARACT EXTRACTION Bilateral    DILATION AND CURETTAGE OF UTERUS     EYE SURGERY Bilateral    HERNIA REPAIR     IRIDOTOMY / IRIDECTOMY Right    JOINT REPLACEMENT  left knee   REFRACTIVE SURGERY Right 2017   Allergies  Allergen Reactions   Meloxicam Hives   Nitrofurantoin    Other Other (See Comments)   Guaifenesin Er Rash    Other reaction(s): rash   Nitrofuran Derivatives Rash      Objective/Physical Exam General: The patient is alert and oriented x3 in no acute distress.  Dermatology:  Wound #1 appears stable.  There continues to be a wound noted to  the right plantar forefoot which continues to measure approximately 0.3 x 0.3 x 0.1 cm (LxWxD).  Periwound callus noted. To the noted ulceration(s), there is no eschar. There is a moderate amount of slough, fibrin, and necrotic tissue noted. Granulation tissue and wound base is red. There is a minimal amount of serosanguineous drainage noted. There is no exposed bone muscle-tendon ligament or joint. There is no malodor.   Vascular: Palpable pedal pulses bilaterally.  Chronic bilateral lower extremity edema noted  Neurological: Epicritic and protective threshold diminished bilaterally.   Musculoskeletal Exam: Fat pad atrophy with tenderness to palpation right forefoot.  Palpation of the metatarsal heads noted specifically to the subsecond and third MTPJ  Assessment: 1.  Ulcer right plantar forefoot secondary to diabetes mellitus 2. diabetes mellitus w/ peripheral neuropathy 3.  Edema bilateral lower extremities   Plan of Care:  1. Patient was evaluated.  Continue conservative treatment 2.  Unna boot compression wraps were applied to the bilateral lower extremities today 3.  Medically necessary excisional debridement including subcutaneous tissue was performed using a tissue nipper.  Excisional debridement of all necrotic nonviable tissue down to healthier bleeding viable tissue was performed with postdebridement measurement same as pre- 4.  Continue diabetic shoes and insoles with offloading felt pads.  The felt pads were  adjusted today 5.  Continue home Unna boot wraps at home 6.  Unfortunately [facility] for unna boot application weekly was only provided for 1 extremity.  She needs bilateral lower extremities so we will reach out to [facility] to have the supplies doubled. 7.  Return to clinic monthly  Edrick Kins, DPM [facility]  Dr. Edrick Kins, DPM    [address], [place] [address]                Office  [phone]  Fax [phone]

## 2022-07-14 ENCOUNTER — Telehealth: Payer: Self-pay | Admitting: *Deleted

## 2022-07-14 DIAGNOSIS — E11621 Type 2 diabetes mellitus with foot ulcer: Secondary | ICD-10-CM | POA: Diagnosis not present

## 2022-07-14 DIAGNOSIS — M1711 Unilateral primary osteoarthritis, right knee: Secondary | ICD-10-CM | POA: Diagnosis not present

## 2022-07-14 NOTE — Telephone Encounter (Signed)
Called [facility] for additional [employer] supplies, spoke with Selinda Eon, will ship out remaining order corrections today.

## 2022-07-14 NOTE — Telephone Encounter (Signed)
-----   Message from Edrick Kins, DPM sent at 07/13/2022  9:07 AM EDT ----- Regarding: [facility] Gearldine Bienenstock,  We ordered [employer] supplies for this patient through [facility].  She needs supplies for her bilateral legs.  They only provided supplies for one leg.  Can we reach out to [facility] and double the supply order?  Thanks, Dr. Amalia Hailey

## 2022-07-28 DIAGNOSIS — R350 Frequency of micturition: Secondary | ICD-10-CM | POA: Diagnosis not present

## 2022-08-08 ENCOUNTER — Encounter: Payer: Self-pay | Admitting: Podiatry

## 2022-08-10 ENCOUNTER — Other Ambulatory Visit (INDEPENDENT_AMBULATORY_CARE_PROVIDER_SITE_OTHER): Payer: Self-pay | Admitting: Ophthalmology

## 2022-08-10 ENCOUNTER — Other Ambulatory Visit: Payer: Self-pay | Admitting: Podiatry

## 2022-08-10 MED ORDER — DOXYCYCLINE HYCLATE 100 MG PO TABS: 100.0000 mg | ORAL_TABLET | Freq: Two times a day (BID) | ORAL | 0 refills | Status: DC

## 2022-08-10 NOTE — Progress Notes (Signed)
PRN heel warmth/pain.

## 2022-08-11 ENCOUNTER — Encounter (INDEPENDENT_AMBULATORY_CARE_PROVIDER_SITE_OTHER): Payer: Self-pay | Admitting: Ophthalmology

## 2022-08-15 ENCOUNTER — Ambulatory Visit (INDEPENDENT_AMBULATORY_CARE_PROVIDER_SITE_OTHER): Payer: Medicare Other

## 2022-08-15 ENCOUNTER — Ambulatory Visit: Payer: Medicare Other | Admitting: Podiatry

## 2022-08-15 DIAGNOSIS — L97512 Non-pressure chronic ulcer of other part of right foot with fat layer exposed: Secondary | ICD-10-CM

## 2022-08-15 DIAGNOSIS — L03115 Cellulitis of right lower limb: Secondary | ICD-10-CM | POA: Diagnosis not present

## 2022-08-15 DIAGNOSIS — R600 Localized edema: Secondary | ICD-10-CM

## 2022-08-15 MED ORDER — TRAMADOL HCL 50 MG PO TABS: 50.0000 mg | ORAL_TABLET | ORAL | 0 refills | Status: AC | PRN

## 2022-08-15 NOTE — Progress Notes (Addendum)
Chief Complaint  Patient presents with   Foot Ulcer    Patient is here for right foot ulcer.    Subjective:  88 y.o. female with PMHx of diabetes mellitus presenting today with her granddaughter for follow-up routine foot care and for evaluation of chronic ulcers to the plantar aspect of the right forefoot.  Over the past week the patient has noticed increased redness and swelling especially to the right lower extremity with increased pain to the bilateral lower extremities.  Patient contacted the office earlier last week on 08/08/2022 and prescription for doxycycline 100 mg 2 times daily #20 was sent to the pharmacy on 08/10/2022.    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 ([place])    Fall at home 09/26/2012   Glaucoma    OU   Gout, joint    Hypertension    Hypertensive retinopathy    OU   Macular degeneration    OU   NSTEMI (non-ST elevated myocardial infarction) ([place]) 08/2015   Stroke [facility])    Past Surgical History:  Procedure Laterality Date   ABDOMINAL HYSTERECTOMY     BREAST SURGERY     CYST   CARDIAC CATHETERIZATION N/A 08/18/2015   Procedure: Left Heart Cath and Coronary Angiography;  Surgeon: Troy Sine, MD;  Location: [place] [facility];  Service: Cardiovascular;  Laterality: N/A;   CARDIAC CATHETERIZATION  08/18/2015   Procedure: Coronary Stent Intervention;  Surgeon: Troy Sine, MD;  Location: [place] [facility];  Service: Cardiovascular;;   CATARACT EXTRACTION Bilateral    DILATION AND CURETTAGE OF UTERUS     EYE SURGERY Bilateral    HERNIA REPAIR     IRIDOTOMY / IRIDECTOMY Right    JOINT REPLACEMENT  left knee   REFRACTIVE SURGERY Right 2017   Allergies  Allergen Reactions   Meloxicam Hives   Nitrofurantoin    Other Other (See Comments)   Guaifenesin Er Rash    Other reaction(s): rash   Nitrofuran Derivatives Rash     LT posterior heel  08/15/2022   RT leg 08/15/2022   RT leg 08/15/2022 Objective/Physical Exam General: The patient is alert and oriented x3 in no acute distress.  Dermatology:  Wound #1 to the plantar aspect of the right forefoot appears stable.  There continues to be a wound noted to the right plantar forefoot which continues to measure approximately 0.3 x 0.3 x 0.1 cm (LxWxD).  Periwound callus noted. New ulcers with superficial skin breakdown noted to the lateral aspect of the right leg as well as the left posterior heel.  Vascular: Palpable pedal pulses bilaterally.  Chronic bilateral lower extremity edema noted.  Mild erythema noted right leg  Neurological: Epicritic and protective threshold diminished bilaterally.   Musculoskeletal Exam: Fat pad atrophy with tenderness to palpation right forefoot.  Palpation of the metatarsal heads noted specifically to the subsecond and third MTPJ  Radiographic exam B/L feet 08/15/2022: Diffuse degenerative changes noted throughout the foot and osteopenia noted which is expected given the patient's age.  No osseous erosions or concern for underlying bone infection/osteomyelitis.  Posterior heel spurs noted bilateral.  Assessment: 1.  Ulcer right plantar forefoot secondary to diabetes mellitus 2. diabetes mellitus w/ peripheral neuropathy 3.  Increased edema bilateral lower extremities with mild to moderate erythema right leg 4.  New skin breakdown with ulcer development lateral aspect of the right leg  as well as left posterior heel   Plan of Care:  1. Patient was evaluated.  Patient's skin is very frail and new wounds have developed to the lateral aspect of the right leg just distal to the knee as well as the left posterior heel. 2.  Unna boot compression wraps were applied to the bilateral lower extremities today.  3.  Medically necessary excisional debridement including subcutaneous tissue was performed using a tissue nipper.  Excisional debridement of all necrotic  nonviable tissue down to healthier bleeding viable tissue was performed with postdebridement measurement same as pre- 4.  Continue diabetic shoes and insoles with offloading felt pads.  The felt pads were adjusted today.  Today due to the wraps the patient is unable to get her shoes on.  Postsurgical shoes dispensed.  WBAT 5.  Continue home Unna boot wraps at home 6.  Continue doxycycline that was prescribed earlier until completed.  Advised the patient and granddaughter to go immediately to the emergency department if she notices increased redness and swelling or worsening cellulitis of the leg or systemic signs of infection 7.  Recommend follow-up with PCP for fluid retention management.  Patient may need to increase or change her diuretic.  Appreciate PCP input 8.  Prescription for tramadol 50 mg #30 every 4 hours as needed pain  9.  Return to clinic 2 weeks  Edrick Kins, DPM [facility]  Dr. Edrick Kins, DPM    [address], [place] [address]                Office [phone]  Fax [phone]

## 2022-08-15 NOTE — Addendum Note (Signed)
Addended by: Edrick Kins on: 08/15/2022 09:45 AM   Modules accepted: Orders

## 2022-08-17 ENCOUNTER — Encounter: Payer: Self-pay | Admitting: Cardiology

## 2022-08-17 DIAGNOSIS — R600 Localized edema: Secondary | ICD-10-CM

## 2022-08-22 DIAGNOSIS — N390 Urinary tract infection, site not specified: Secondary | ICD-10-CM | POA: Diagnosis not present

## 2022-08-22 DIAGNOSIS — R600 Localized edema: Secondary | ICD-10-CM | POA: Diagnosis not present

## 2022-08-23 DIAGNOSIS — E11621 Type 2 diabetes mellitus with foot ulcer: Secondary | ICD-10-CM | POA: Diagnosis not present

## 2022-08-25 DIAGNOSIS — R350 Frequency of micturition: Secondary | ICD-10-CM | POA: Diagnosis not present

## 2022-08-31 DIAGNOSIS — R600 Localized edema: Secondary | ICD-10-CM | POA: Diagnosis not present

## 2022-08-31 DIAGNOSIS — I5032 Chronic diastolic (congestive) heart failure: Secondary | ICD-10-CM | POA: Diagnosis not present

## 2022-09-09 DIAGNOSIS — E1122 Type 2 diabetes mellitus with diabetic chronic kidney disease: Secondary | ICD-10-CM | POA: Diagnosis not present

## 2022-09-09 DIAGNOSIS — E559 Vitamin D deficiency, unspecified: Secondary | ICD-10-CM | POA: Diagnosis not present

## 2022-09-12 ENCOUNTER — Ambulatory Visit: Payer: Medicare Other | Admitting: Podiatry

## 2022-09-12 DIAGNOSIS — R600 Localized edema: Secondary | ICD-10-CM

## 2022-09-12 DIAGNOSIS — L97512 Non-pressure chronic ulcer of other part of right foot with fat layer exposed: Secondary | ICD-10-CM | POA: Diagnosis not present

## 2022-09-12 NOTE — Progress Notes (Signed)
Chief Complaint  Patient presents with   Foot Ulcer    Subjective:  88 y.o. female with PMHx of diabetes mellitus presenting today with her granddaughter for follow-up routine foot care and for evaluation of chronic ulcers to the plantar aspect of the right forefoot.  Overall the patient is doing well.  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)    Fall at home 09/26/2012   Glaucoma    OU   Gout, joint    Hypertension    Hypertensive retinopathy    OU   Macular degeneration    OU   NSTEMI (non-ST elevated myocardial infarction) ([facility]) 08/2015   Stroke [facility])    Past Surgical History:  Procedure Laterality Date   ABDOMINAL HYSTERECTOMY     BREAST SURGERY     CYST   CARDIAC CATHETERIZATION N/A 08/18/2015   Procedure: Left Heart Cath and Coronary Angiography;  Surgeon: Lennette Bihari, MD;  Location: [facility];  Service: Cardiovascular;  Laterality: N/A;   CARDIAC CATHETERIZATION  08/18/2015   Procedure: Coronary Stent Intervention;  Surgeon: Lennette Bihari, MD;  Location: [facility];  Service: Cardiovascular;;   CATARACT EXTRACTION Bilateral    DILATION AND CURETTAGE OF UTERUS     EYE SURGERY Bilateral    HERNIA REPAIR     IRIDOTOMY / IRIDECTOMY Right    JOINT REPLACEMENT  left knee   REFRACTIVE SURGERY Right 2017   Allergies  Allergen Reactions   Meloxicam Hives   Nitrofurantoin    Other Other (See Comments)   Guaifenesin Er Rash    Other reaction(s): rash   Nitrofuran Derivatives Rash     Objective/Physical Exam General: The patient is alert and oriented x3 in no acute distress.  Dermatology:  Wound #1 to the plantar aspect of the right forefoot appears stable.  There continues to be a wound noted to the right plantar forefoot which continues to measure approximately 0.3 x 0.3 x 0.1 cm (LxWxD).  Periwound  callus noted. Ulcers to the lateral aspect of the right leg as well as the left posterior heel appear resolved and healed completely  Vascular: Palpable pedal pulses bilaterally.  Chronic bilateral lower extremity edema noted.  Mild erythema noted right leg  Neurological: Epicritic and protective threshold diminished bilaterally.   Musculoskeletal Exam: Fat pad atrophy with tenderness to palpation right forefoot.  Palpation of the metatarsal heads noted specifically to the subsecond and third MTPJ  Radiographic exam B/L feet 08/15/2022: Diffuse degenerative changes noted throughout the foot and osteopenia noted which is expected given the patient's age.  No osseous erosions or concern for underlying bone infection/osteomyelitis.  Posterior heel spurs noted bilateral.  Assessment: 1.  Ulcer right plantar forefoot secondary to diabetes mellitus 2. diabetes mellitus w/ peripheral neuropathy 3.  Increased edema bilateral lower extremities with mild to moderate erythema right leg 4.  New skin breakdown with ulcer development lateral aspect of the right leg as well as left posterior heel   Plan of Care:  1. Patient was evaluated.  Patient's skin is very frail and new wounds have developed to the lateral aspect of the right leg just distal to the knee as well as the left posterior heel. 2.  Unna boot compression wraps were applied to the bilateral lower extremities today.  3.  Medically necessary excisional  debridement including subcutaneous tissue was performed using a tissue nipper.  Excisional debridement of all necrotic nonviable tissue down to healthier bleeding viable tissue was performed with postdebridement measurement same as pre- 4.  Continue diabetic shoes and insoles with offloading felt pads.  The felt pads were adjusted today.  Today due to the wraps the patient is unable to get her shoes on.  Postsurgical shoes dispensed.  WBAT 5.  Continue home Unna boot wraps at home 6.  Continue  tramadol 50 mg #30 every 4 hours as needed pain 7.  Recommend follow-up with PCP for fluid retention management.  Patient may need to increase or change her diuretic.  Appreciate PCP input 8.  Return to clinic 4 weeks  Felecia Shelling, DPM [facility]  Dr. Felecia Shelling, DPM    [address] [place], [place] [address]                Office [phone]  Fax [phone]

## 2022-09-16 ENCOUNTER — Encounter: Payer: Self-pay | Admitting: Podiatry

## 2022-09-19 ENCOUNTER — Ambulatory Visit: Payer: Medicare Other | Admitting: Podiatry

## 2022-09-19 NOTE — Telephone Encounter (Signed)
Please advise 

## 2022-09-20 DIAGNOSIS — I10 Essential (primary) hypertension: Secondary | ICD-10-CM | POA: Diagnosis not present

## 2022-09-20 DIAGNOSIS — J069 Acute upper respiratory infection, unspecified: Secondary | ICD-10-CM | POA: Diagnosis not present

## 2022-09-20 DIAGNOSIS — I5032 Chronic diastolic (congestive) heart failure: Secondary | ICD-10-CM | POA: Diagnosis not present

## 2022-09-20 DIAGNOSIS — I872 Venous insufficiency (chronic) (peripheral): Secondary | ICD-10-CM | POA: Diagnosis not present

## 2022-09-20 DIAGNOSIS — N1831 Chronic kidney disease, stage 3a: Secondary | ICD-10-CM | POA: Diagnosis not present

## 2022-09-20 DIAGNOSIS — I2510 Atherosclerotic heart disease of native coronary artery without angina pectoris: Secondary | ICD-10-CM | POA: Diagnosis not present

## 2022-09-20 DIAGNOSIS — E785 Hyperlipidemia, unspecified: Secondary | ICD-10-CM | POA: Diagnosis not present

## 2022-09-20 DIAGNOSIS — E1122 Type 2 diabetes mellitus with diabetic chronic kidney disease: Secondary | ICD-10-CM | POA: Diagnosis not present

## 2022-09-22 DIAGNOSIS — R350 Frequency of micturition: Secondary | ICD-10-CM | POA: Diagnosis not present

## 2022-09-23 NOTE — Telephone Encounter (Signed)
Ammie, do you know of another/better medical supply company that we can order this from? Same supplies needed that were ordered from [facility].  Thanks, Dr. Logan Bores

## 2022-09-26 NOTE — Progress Notes (Signed)
[facility] - Clinic Note  10/06/2022    CHIEF COMPLAINT Patient presents for Retina Follow Up  HISTORY OF PRESENT ILLNESS: [name] is a 88 y.o. female who presents to the clinic today for:  HPI     Retina Follow Up   Patient presents with  CRVO/BRVO.  In right eye.  This started 4 months ago.  I, the attending physician,  performed the HPI with the patient and updated documentation appropriately.        Comments   Patient here for 4 months retina follow up for BRVO OD. Patient states vision has to use glasses to read. No eye pain. Uses drops.      Last edited by Rennis Chris, MD on 10/07/2022 12:27 PM.    Patient states she has to wear glasses to read. She is using all of the same drops.   Referring physician: Sallye Lat, MD [address] [place],  [place] [address]  HISTORICAL INFORMATION:   Selected notes from the MEDICAL RECORD NUMBER Referred by Dr. Marchelle Gearing for concern of RVO OS   CURRENT MEDICATIONS: Current Outpatient Medications (Ophthalmic Drugs)  Medication Sig   brimonidine (ALPHAGAN) 0.2 % ophthalmic solution Place 1 drop into both eyes in the morning and at bedtime.   [employer] SOD CHLORIDE HYPERTONICITY 5 % ophthalmic ointment PLACE 1 APPLICATION INTO THE RIGHT EYE AT BEDTIME.   Loteprednol Etabonate (LOTEMAX SM) 0.38 % GEL Place 1 drop into the right eye 2 (two) times daily.   Polyethyl Glycol-Propyl Glycol (SYSTANE OP) Apply 1 drop to eye 2 (two) times daily as needed (dry eyes).   sodium chloride (MURO 128) 5 % ophthalmic solution INSTILL 1 DROP INTO RIGHT EYE 4 TIMES A DAY   timolol (TIMOPTIC) 0.5 % ophthalmic solution Place 1 drop into both eyes every morning.   No current facility-administered medications for this visit. (Ophthalmic Drugs)   Current Outpatient Medications (Other)  Medication Sig   Acetaminophen 325 MG CAPS Take 325 mg by mouth 3 (three) times daily as needed.   aspirin 81 MG chewable tablet  Chew 81 mg by mouth daily.   atorvastatin (LIPITOR) 80 MG tablet Take 1 tablet (80 mg total) by mouth daily at 6 PM.   Calcium Carbonate-Vitamin D (CALTRATE 600+D PO) Take 1 tablet by mouth daily.   chlorpheniramine (CHLOR-TRIMETON) 4 MG tablet Take 4 mg by mouth daily.   denosumab (PROLIA) 60 MG/ML SOSY injection 60 mg See admin instructions.   famotidine (PEPCID) 20 MG tablet Take 20 mg by mouth daily.   FLUZONE HIGH-DOSE QUADRIVALENT 0.7 ML SUSY    furosemide (LASIX) 40 MG tablet Take 20 mg by mouth daily.   GEMTESA 75 MG TABS Take 75 mg by mouth daily.   gentamicin cream (GARAMYCIN) 0.1 % Apply 1 application topically 2 (two) times daily.   metoprolol (LOPRESSOR) 50 MG tablet Take 25 mg by mouth 2 (two) times daily.   PFIZER-BIONT COVID-19 VAC-TRIS SUSP injection    amLODipine (NORVASC) 2.5 MG tablet Take 1 tablet (2.5 mg total) by mouth daily.   doxycycline (VIBRA-TABS) 100 MG tablet Take 1 tablet (100 mg total) by mouth 2 (two) times daily.   No current facility-administered medications for this visit. (Other)   REVIEW OF SYSTEMS: ROS   Positive for: Cardiovascular, Eyes, Respiratory Negative for: Constitutional, Gastrointestinal, Neurological, Skin, Genitourinary, Musculoskeletal, HENT, Endocrine, Psychiatric, Allergic/Imm, Heme/Lymph Last edited by Laddie Aquas, COA on 10/06/2022  9:02 AM.  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)    Fall at home 09/26/2012   Glaucoma    OU   Gout, joint    Hypertension    Hypertensive retinopathy    OU   Macular degeneration    OU   NSTEMI (non-ST elevated myocardial infarction) (HCC) 08/2015   Stroke [facility])     Past Surgical History:  Procedure Laterality Date   ABDOMINAL HYSTERECTOMY     BREAST SURGERY     CYST   CARDIAC CATHETERIZATION N/A 08/18/2015   Procedure: Left Heart Cath and Coronary Angiography;  Surgeon: Lennette Bihari, MD;  Location: [facility] INVASIVE CV LAB;  Service: Cardiovascular;  Laterality: N/A;   CARDIAC CATHETERIZATION  08/18/2015   Procedure: Coronary Stent Intervention;  Surgeon: Lennette Bihari, MD;  Location: [facility] INVASIVE CV LAB;  Service: Cardiovascular;;   CATARACT EXTRACTION Bilateral    DILATION AND CURETTAGE OF UTERUS     EYE SURGERY Bilateral    HERNIA REPAIR     IRIDOTOMY / IRIDECTOMY Right    JOINT REPLACEMENT  left knee   REFRACTIVE SURGERY Right 2017   FAMILY HISTORY Family History  Problem Relation Age of Onset   Heart disease Mother    Stroke Mother    SOCIAL HISTORY Social History   Tobacco Use   Smoking status: Never   Smokeless tobacco: Never  Vaping Use   Vaping Use: Never used  Substance Use Topics   Alcohol use: No    Alcohol/week: 0.0 standard drinks of alcohol   Drug use: No       OPHTHALMIC EXAM:  Base Eye Exam     Visual Acuity (Snellen - Linear)       Right Left   Dist [place] CF at 6" 20/30 -2   Dist ph [place] NI NI         Tonometry (Tonopen, 8:58 AM)       Right Left   Pressure 12 15         Pupils       Dark Light Shape React APD   Right 3 3 Round NR None   Left 3 2 Round Brisk None         Visual Fields       Left Right   Restrictions Total inferior temporal, superior nasal, inferior nasal deficiencies; Partial inner superior temporal deficiency          Neuro/Psych     Oriented x3: Yes   Mood/Affect: Normal         Dilation     Both eyes: 1.0% Mydriacyl, 2.5% Phenylephrine @ 8:58 AM           Slit Lamp and Fundus Exam     External Exam       Right Left   External  left sided facial ecchymosis         Slit Lamp Exam       Right Left   Lids/Lashes Dermatochalasis - upper lid, trace  periorbital swelling, Telangiectasia Dermatochalasis - upper lid, mild Meibomian gland dysfunction   Conjunctiva/Sclera Trace Injection White and quiet   Cornea Arcus, 3+ edema  w/ Descemet's folds, well healed temporal cataract wounds, 2+ fine endo pigment, 1-2+ Punctate epithelial erosions Arcus, 2+fine Punctate epithelial erosions, Well healed cataract wounds, tear film debris   Anterior Chamber Deep and clear, No cells or pigment Deep and quiet   Iris Round and poorly dilated, No NVI Round and dilated   Lens PC IOL in good position with open PC PC IOL in good position with open PC   Anterior Vitreous Vitreous syneresis, +RBC, mild diffuse VH, vitreous condensations, +pigment Vitreous syneresis, Posterior vitreous detachment         Fundus Exam       Right Left   Disc Hazy view, 3-4+pallor, fully cupped, thin inf rim, 360 PPA, Sharp rim 2+ Pallor, Sharp rim, +cupping, temporal Peripapillary atrophy   C/D Ratio 0.99 0.6   Macula Hazy view -- grossly flat, central pigment clump, mild fibrosis, scattered DBH inferiorly, +edema Flat, good foveal reflex, RPE mottling and clumping, drusen, No heme or edema   Vessels attenuated, Tortuous attenuated, Tortuous   Periphery Hazy view, grossly attached, obscured by corneal edema Attached, No heme            Refraction     Wearing Rx       Sphere Cylinder Axis   Right -1.50 +2.25 173   Left -0.75 +2.25 166           IMAGING AND PROCEDURES  Imaging and Procedures for @TODAY @  OCT, Retina - OU - Both Eyes       Right Eye Quality was poor. Progression has worsened. Findings include no SRF, abnormal foveal contour, subretinal hyper-reflective material, intraretinal fluid, inner retinal atrophy, outer retinal atrophy (Very poor image quality, retina attached with persistent retinal thickening / edema).   Left Eye Quality was borderline. Central Foveal Thickness: 248. Progression has been stable. Findings include normal foveal contour,  no IRF, no SRF, retinal drusen , outer retinal atrophy (stable improvement in focal IRF SN fovea, patchy ORA).   Notes *Images captured and stored on drive  Diagnosis / Impression:  OD: Very poor image quality, retina attached with persistent retinal thickening / edema OS: NFP, no SRF, +drusen -- stable improvement of focal IRF SN fovea, patchy ORA  Clinical management:  See below  Abbreviations: NFP - Normal foveal profile. CME - cystoid macular edema. PED - pigment epithelial detachment. IRF - intraretinal fluid. SRF - subretinal fluid. EZ - ellipsoid zone. ERM - epiretinal membrane. ORA - outer retinal atrophy. ORT - outer retinal tubulation. SRHM - subretinal hyper-reflective material            ASSESSMENT/PLAN:    ICD-10-CM   1. Branch retinal vein occlusion of right eye with macular edema  H34.8310 OCT, Retina - OU - Both Eyes    2. Vitreous hemorrhage of right eye (HCC)  H43.11     3. Exudative age-related macular degeneration of right eye with active choroidal neovascularization (HCC)  H35.3211     4. Intermediate stage nonexudative age-related macular degeneration of left eye  H35.3122     5. Essential hypertension  I10     6. Hypertensive retinopathy of both eyes  H35.033     7. Primary open angle glaucoma (POAG) of both eyes, severe stage  H40.1133     8. Pseudophakia of both eyes  Z96.1     9. Dry eyes, bilateral  H04.123     10. Corneal edema  H18.20      1,2. Inferior HRVO w/ CME and VH,  OD  - pt lost to f/u from 10/16/2019 to 07/14/2020 (9 mos)  - presenting BCVA CF 6' (09.14.20)  - pt subjectively reports decline in vision OD since Feb 2020 - presenting OCT shows severe CME/IRF temporal macula, +SRF overlying low PED (?exudative ARMD component) - S/p IVA OD #1 09.14.20, #2 (10.20.20), #3 (12.02.20), #4 (01.06.21), #5 (10.05.21), #6 (11.03.21), #7 (12.01.21), #8 (12.29.22), #9 (04.13.22), #10 (05.11.22), #11 (06.16.22), #12 (07.21.22) - OCT today shows  poor image quality - BCVA CF at 6"  - discussed findings, poor prognosis and treatment options   - recommend holding IVA OD today  - f/u 4 mos, DFE, OCT  3. Exudative age related macular degeneration, OD    - initial OCT showed +IRF/SRF -- SRF overlying low lying PED - original exam shows +CNVM with surrounding heme --stably improved today  - suspect exudative ARMD component complicating HRVO w/ CME - S/p IVA OD #1 (09.14.20), #2 (10.20.20), #3 (12.02.20), #4 (01.06.21), #5 (10.05.21), #6 (11.03.21), #7 (12.01.21), #8 (12.29.22), #9 (04.13.22), #10 (05.11.22), #11 (06.16.22), #12 (07.12.22)  - recommend holding IVA OD today as above   - f/u 4 months -- DFE/OCT, possible injection  4. Age related macular degeneration, non-exudative, OS  - OCT shows stable improvement of focal cystic changes  - no heme or edema visible on exam  - recommend close observation   - cont Amsler grid monitoring   - f/u 4 mos  5,6. Hypertensive retinopathy OU  - discussed importance of tight BP control  - continue to monitor   7. POAG OU -- severe stage  - under the expert management of Dr. Marchelle Gearing  - s/p SLT OD 3.23.2017  - s/p TDC OD w/ Dr. Loraine Grip, 3.30.21  - IOP 12,15  - Brimonidine BID OU   - Timolol QAM per Dr. Dione Booze  - continue to monitor   8. Pseudophakia OU  - s/p CE/IOL OU  - beautiful surgeries, doing well  - continue to monitor   9. Dry eyes OU (OD > OS)  - recommend artificial tears and lubricating ointment as needed  10. Corneal edema OD  - 3+ Descemet folds and mild haze  - recommend treatment to improve monitoring of retina and posterior pole   - cont Lotemax SM OD -- decrease to BID  - cont Muro 128 gtts QID OD  - cont Muro 128 ung qhs OD  - continue to monitor    Ophthalmic Meds Ordered this visit:  No orders of the defined types were placed in this encounter.    Return in about 4 months (around 02/05/2023) for f/u HRVO w/ CME OD, DFE, OCT.  There are no Patient  Instructions on file for this visit.   Explained the diagnoses, plan, and follow up with the patient and they expressed understanding.  Patient expressed understanding of the importance of proper follow up care.   This document serves as a record of services personally performed by Karie Chimera, MD, PhD. It was created on their behalf by Glee Arvin. Manson Passey, OA an ophthalmic technician. The creation of this record is the provider's dictation and/or activities during the visit.    Electronically signed by: Glee Arvin. Manson Passey, [place] 12.18.2023 12:28 PM  This document serves as a record of services personally performed by Karie Chimera, MD, PhD. It was created on their behalf by Gerilyn Nestle, COT an ophthalmic technician. The creation of this record is the provider's dictation and/or activities during the visit.  Electronically signed by:  Gerilyn Nestle, COT  12.28.23 12:28 PM  Karie Chimera, M.D., Ph.D. Diseases & Surgery of the Retina and Vitreous [facility]  I have reviewed the above documentation for accuracy and completeness, and I agree with the above. Karie Chimera, M.D., Ph.D. 10/07/22 12:29 PM  Abbreviations: M myopia (nearsighted); A astigmatism; H hyperopia (farsighted); P presbyopia; Mrx spectacle prescription;  CTL contact lenses; OD right eye; OS left eye; OU both eyes  XT exotropia; ET esotropia; PEK punctate epithelial keratitis; PEE punctate epithelial erosions; DES dry eye syndrome; MGD meibomian gland dysfunction; ATs artificial tears; PFAT's preservative free artificial tears; NSC nuclear sclerotic cataract; PSC posterior subcapsular cataract; ERM epi-retinal membrane; PVD posterior vitreous detachment; RD retinal detachment; DM diabetes mellitus; DR diabetic retinopathy; NPDR non-proliferative diabetic retinopathy; PDR proliferative diabetic retinopathy; CSME clinically significant macular edema; DME diabetic macular edema; dbh dot blot  hemorrhages; CWS cotton wool spot; POAG primary open angle glaucoma; C/D cup-to-disc ratio; HVF humphrey visual field; GVF goldmann visual field; OCT optical coherence tomography; IOP intraocular pressure; BRVO Branch retinal vein occlusion; CRVO central retinal vein occlusion; CRAO central retinal artery occlusion; BRAO branch retinal artery occlusion; RT retinal tear; SB scleral buckle; PPV pars plana vitrectomy; VH Vitreous hemorrhage; PRP panretinal laser photocoagulation; IVK intravitreal kenalog; VMT vitreomacular traction; MH Macular hole;  NVD neovascularization of the disc; NVE neovascularization elsewhere; AREDS age related eye disease study; ARMD age related macular degeneration; POAG primary open angle glaucoma; EBMD epithelial/anterior basement membrane dystrophy; ACIOL anterior chamber intraocular lens; IOL intraocular lens; PCIOL posterior chamber intraocular lens; Phaco/IOL phacoemulsification with intraocular lens placement; PRK photorefractive keratectomy; LASIK laser assisted in situ keratomileusis; HTN hypertension; DM diabetes mellitus; COPD chronic obstructive pulmonary disease

## 2022-09-29 ENCOUNTER — Telehealth: Payer: Self-pay | Admitting: *Deleted

## 2022-09-29 DIAGNOSIS — E11621 Type 2 diabetes mellitus with foot ulcer: Secondary | ICD-10-CM | POA: Diagnosis not present

## 2022-09-29 NOTE — Telephone Encounter (Signed)
Encounter created in error

## 2022-10-06 ENCOUNTER — Ambulatory Visit (INDEPENDENT_AMBULATORY_CARE_PROVIDER_SITE_OTHER): Payer: Medicare Other | Admitting: Ophthalmology

## 2022-10-06 ENCOUNTER — Encounter (INDEPENDENT_AMBULATORY_CARE_PROVIDER_SITE_OTHER): Payer: Self-pay | Admitting: Ophthalmology

## 2022-10-06 DIAGNOSIS — I10 Essential (primary) hypertension: Secondary | ICD-10-CM | POA: Diagnosis not present

## 2022-10-06 DIAGNOSIS — Z961 Presence of intraocular lens: Secondary | ICD-10-CM | POA: Diagnosis not present

## 2022-10-06 DIAGNOSIS — H34831 Tributary (branch) retinal vein occlusion, right eye, with macular edema: Secondary | ICD-10-CM | POA: Diagnosis not present

## 2022-10-06 DIAGNOSIS — H04123 Dry eye syndrome of bilateral lacrimal glands: Secondary | ICD-10-CM

## 2022-10-06 DIAGNOSIS — H353122 Nonexudative age-related macular degeneration, left eye, intermediate dry stage: Secondary | ICD-10-CM

## 2022-10-06 DIAGNOSIS — H353211 Exudative age-related macular degeneration, right eye, with active choroidal neovascularization: Secondary | ICD-10-CM

## 2022-10-06 DIAGNOSIS — H401133 Primary open-angle glaucoma, bilateral, severe stage: Secondary | ICD-10-CM | POA: Diagnosis not present

## 2022-10-06 DIAGNOSIS — H1820 Unspecified corneal edema: Secondary | ICD-10-CM

## 2022-10-06 DIAGNOSIS — H4311 Vitreous hemorrhage, right eye: Secondary | ICD-10-CM

## 2022-10-06 DIAGNOSIS — H35033 Hypertensive retinopathy, bilateral: Secondary | ICD-10-CM | POA: Diagnosis not present

## 2022-10-07 ENCOUNTER — Encounter (INDEPENDENT_AMBULATORY_CARE_PROVIDER_SITE_OTHER): Payer: Self-pay | Admitting: Ophthalmology

## 2022-10-12 ENCOUNTER — Ambulatory Visit: Payer: Medicare Other | Admitting: Podiatry

## 2022-10-12 DIAGNOSIS — R600 Localized edema: Secondary | ICD-10-CM

## 2022-10-12 DIAGNOSIS — L97512 Non-pressure chronic ulcer of other part of right foot with fat layer exposed: Secondary | ICD-10-CM

## 2022-10-12 NOTE — Progress Notes (Signed)
No chief complaint on file.   Subjective:  89 y.o. female with PMHx of diabetes mellitus presenting today with her granddaughter for follow-up routine foot care and for evaluation of chronic ulcers to the plantar aspect of the right forefoot.  Overall the patient is doing well.  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 CV LAB;  Service: Cardiovascular;  Laterality: N/A;   CARDIAC CATHETERIZATION  08/18/2015   Procedure: Coronary Stent Intervention;  Surgeon: Troy Sine, MD;  Location: [place] CV LAB;  Service: Cardiovascular;;   CATARACT EXTRACTION Bilateral    DILATION AND CURETTAGE OF UTERUS     EYE SURGERY Bilateral    HERNIA REPAIR     IRIDOTOMY / IRIDECTOMY Right    JOINT REPLACEMENT  left knee   REFRACTIVE SURGERY Right 2017   Allergies  Allergen Reactions   Meloxicam Hives   Nitrofurantoin    Other Other (See Comments)   Guaifenesin Er Rash    Other reaction(s): rash   Nitrofuran Derivatives Rash     Objective/Physical Exam General: The patient is alert and oriented x3 in no acute distress.  Dermatology:  Wound #1 to the plantar aspect of the right forefoot appears stable.  There continues to be a wound noted to the right plantar forefoot which continues to measure approximately 0.3 x 0.3 x 0.1 cm (LxWxD).  Periwound callus noted. Ulcers to the  lateral aspect of the right leg as well as the left posterior heel appear resolved and healed completely  Vascular: Palpable pedal pulses bilaterally.  Chronic bilateral lower extremity edema noted.  Mild erythema noted right leg  Neurological: Epicritic and protective threshold diminished bilaterally.   Musculoskeletal Exam: Fat pad atrophy with tenderness to palpation right forefoot.  Palpation of the metatarsal heads noted specifically to the subsecond and third MTPJ  Radiographic exam B/L feet 08/15/2022: Diffuse degenerative changes noted throughout the foot and osteopenia noted which is expected given the patient's age.  No osseous erosions or concern for underlying bone infection/osteomyelitis.  Posterior heel spurs noted bilateral.  Assessment: 1.  Ulcer right plantar forefoot secondary to diabetes mellitus 2. diabetes mellitus w/ peripheral neuropathy 3.  Increased edema bilateral lower extremities with mild to moderate erythema right leg 4.  New skin breakdown with ulcer development lateral aspect of the right leg as well as left posterior heel   Plan of Care:  1. Patient was evaluated.  Patient's skin is very frail and new wounds have developed to the lateral aspect of the right leg just distal to the knee as well as the left posterior heel. 2.  Unna boot compression wraps were applied to the bilateral lower extremities today.  3.  Medically necessary excisional debridement including subcutaneous tissue was performed  using a tissue nipper.  Excisional debridement of all necrotic nonviable tissue down to healthier bleeding viable tissue was performed with postdebridement measurement same as pre- 4.  Continue diabetic shoes and insoles with offloading felt pads.  The felt pads were adjusted today.  Today due to the wraps the patient is unable to get her shoes on.  Postsurgical shoes dispensed.  WBAT 5.  Continue home Unna boot wraps at home 6.  Return to clinic 4 weeks  Edrick Kins, DPM [facility]  Dr. Edrick Kins, DPM    [address], [place] [address]                Office [phone]  Fax [phone]

## 2022-10-20 ENCOUNTER — Encounter (HOSPITAL_BASED_OUTPATIENT_CLINIC_OR_DEPARTMENT_OTHER): Payer: Self-pay | Admitting: Emergency Medicine

## 2022-10-20 ENCOUNTER — Telehealth: Payer: Self-pay | Admitting: Cardiology

## 2022-10-20 ENCOUNTER — Emergency Department (HOSPITAL_BASED_OUTPATIENT_CLINIC_OR_DEPARTMENT_OTHER): Payer: Medicare Other

## 2022-10-20 ENCOUNTER — Inpatient Hospital Stay (HOSPITAL_BASED_OUTPATIENT_CLINIC_OR_DEPARTMENT_OTHER)
Admission: EM | Admit: 2022-10-20 | Discharge: 2022-10-31 | DRG: 291 | Disposition: A | Discharge status: Skilled Nursing Facility | Payer: Medicare Other | Attending: Internal Medicine | Admitting: Internal Medicine

## 2022-10-20 ENCOUNTER — Emergency Department (HOSPITAL_BASED_OUTPATIENT_CLINIC_OR_DEPARTMENT_OTHER): Payer: Medicare Other | Admitting: Radiology

## 2022-10-20 ENCOUNTER — Encounter (HOSPITAL_COMMUNITY): Payer: Self-pay

## 2022-10-20 ENCOUNTER — Other Ambulatory Visit: Payer: Self-pay

## 2022-10-20 DIAGNOSIS — H35033 Hypertensive retinopathy, bilateral: Secondary | ICD-10-CM | POA: Diagnosis present

## 2022-10-20 DIAGNOSIS — R062 Wheezing: Secondary | ICD-10-CM | POA: Diagnosis not present

## 2022-10-20 DIAGNOSIS — R680 Hypothermia, not associated with low environmental temperature: Secondary | ICD-10-CM | POA: Diagnosis present

## 2022-10-20 DIAGNOSIS — Z79899 Other long term (current) drug therapy: Secondary | ICD-10-CM | POA: Diagnosis not present

## 2022-10-20 DIAGNOSIS — B9620 Unspecified Escherichia coli [E. coli] as the cause of diseases classified elsewhere: Secondary | ICD-10-CM | POA: Diagnosis present

## 2022-10-20 DIAGNOSIS — N390 Urinary tract infection, site not specified: Secondary | ICD-10-CM | POA: Diagnosis not present

## 2022-10-20 DIAGNOSIS — I252 Old myocardial infarction: Secondary | ICD-10-CM | POA: Diagnosis not present

## 2022-10-20 DIAGNOSIS — R350 Frequency of micturition: Secondary | ICD-10-CM | POA: Diagnosis not present

## 2022-10-20 DIAGNOSIS — E875 Hyperkalemia: Secondary | ICD-10-CM | POA: Diagnosis not present

## 2022-10-20 DIAGNOSIS — I5033 Acute on chronic diastolic (congestive) heart failure: Secondary | ICD-10-CM | POA: Diagnosis present

## 2022-10-20 DIAGNOSIS — I872 Venous insufficiency (chronic) (peripheral): Secondary | ICD-10-CM | POA: Diagnosis present

## 2022-10-20 DIAGNOSIS — J439 Emphysema, unspecified: Secondary | ICD-10-CM | POA: Diagnosis not present

## 2022-10-20 DIAGNOSIS — N183 Chronic kidney disease, stage 3 unspecified: Secondary | ICD-10-CM | POA: Diagnosis present

## 2022-10-20 DIAGNOSIS — Z96652 Presence of left artificial knee joint: Secondary | ICD-10-CM | POA: Diagnosis not present

## 2022-10-20 DIAGNOSIS — E041 Nontoxic single thyroid nodule: Secondary | ICD-10-CM | POA: Diagnosis present

## 2022-10-20 DIAGNOSIS — H04123 Dry eye syndrome of bilateral lacrimal glands: Secondary | ICD-10-CM | POA: Diagnosis not present

## 2022-10-20 DIAGNOSIS — J9601 Acute respiratory failure with hypoxia: Secondary | ICD-10-CM | POA: Diagnosis present

## 2022-10-20 DIAGNOSIS — D509 Iron deficiency anemia, unspecified: Secondary | ICD-10-CM | POA: Diagnosis present

## 2022-10-20 DIAGNOSIS — N1832 Chronic kidney disease, stage 3b: Secondary | ICD-10-CM | POA: Diagnosis present

## 2022-10-20 DIAGNOSIS — Z823 Family history of stroke: Secondary | ICD-10-CM

## 2022-10-20 DIAGNOSIS — Z66 Do not resuscitate: Secondary | ICD-10-CM | POA: Diagnosis not present

## 2022-10-20 DIAGNOSIS — I2510 Atherosclerotic heart disease of native coronary artery without angina pectoris: Secondary | ICD-10-CM | POA: Diagnosis present

## 2022-10-20 DIAGNOSIS — Z9181 History of falling: Secondary | ICD-10-CM | POA: Diagnosis not present

## 2022-10-20 DIAGNOSIS — E785 Hyperlipidemia, unspecified: Secondary | ICD-10-CM | POA: Diagnosis present

## 2022-10-20 DIAGNOSIS — Z87440 Personal history of urinary (tract) infections: Secondary | ICD-10-CM | POA: Diagnosis not present

## 2022-10-20 DIAGNOSIS — I69959 Hemiplegia and hemiparesis following unspecified cerebrovascular disease affecting unspecified side: Secondary | ICD-10-CM

## 2022-10-20 DIAGNOSIS — M109 Gout, unspecified: Secondary | ICD-10-CM | POA: Diagnosis not present

## 2022-10-20 DIAGNOSIS — T68XXXA Hypothermia, initial encounter: Secondary | ICD-10-CM | POA: Diagnosis present

## 2022-10-20 DIAGNOSIS — J9811 Atelectasis: Secondary | ICD-10-CM | POA: Diagnosis not present

## 2022-10-20 DIAGNOSIS — R059 Cough, unspecified: Secondary | ICD-10-CM | POA: Diagnosis not present

## 2022-10-20 DIAGNOSIS — I270 Primary pulmonary hypertension: Secondary | ICD-10-CM | POA: Diagnosis present

## 2022-10-20 DIAGNOSIS — Z743 Need for continuous supervision: Secondary | ICD-10-CM | POA: Diagnosis not present

## 2022-10-20 DIAGNOSIS — T68XXXD Hypothermia, subsequent encounter: Secondary | ICD-10-CM | POA: Diagnosis not present

## 2022-10-20 DIAGNOSIS — Z955 Presence of coronary angioplasty implant and graft: Secondary | ICD-10-CM | POA: Diagnosis not present

## 2022-10-20 DIAGNOSIS — E43 Unspecified severe protein-calorie malnutrition: Secondary | ICD-10-CM | POA: Diagnosis not present

## 2022-10-20 DIAGNOSIS — H9190 Unspecified hearing loss, unspecified ear: Secondary | ICD-10-CM | POA: Diagnosis present

## 2022-10-20 DIAGNOSIS — M25512 Pain in left shoulder: Secondary | ICD-10-CM | POA: Diagnosis not present

## 2022-10-20 DIAGNOSIS — I130 Hypertensive heart and chronic kidney disease with heart failure and stage 1 through stage 4 chronic kidney disease, or unspecified chronic kidney disease: Secondary | ICD-10-CM | POA: Diagnosis not present

## 2022-10-20 DIAGNOSIS — M1711 Unilateral primary osteoarthritis, right knee: Secondary | ICD-10-CM | POA: Diagnosis not present

## 2022-10-20 DIAGNOSIS — M25561 Pain in right knee: Secondary | ICD-10-CM | POA: Diagnosis not present

## 2022-10-20 DIAGNOSIS — E8809 Other disorders of plasma-protein metabolism, not elsewhere classified: Secondary | ICD-10-CM | POA: Diagnosis present

## 2022-10-20 DIAGNOSIS — R600 Localized edema: Secondary | ICD-10-CM

## 2022-10-20 DIAGNOSIS — Z7401 Bed confinement status: Secondary | ICD-10-CM | POA: Diagnosis not present

## 2022-10-20 DIAGNOSIS — J90 Pleural effusion, not elsewhere classified: Secondary | ICD-10-CM | POA: Diagnosis not present

## 2022-10-20 DIAGNOSIS — E860 Dehydration: Secondary | ICD-10-CM | POA: Diagnosis not present

## 2022-10-20 DIAGNOSIS — Z1152 Encounter for screening for COVID-19: Secondary | ICD-10-CM

## 2022-10-20 DIAGNOSIS — Z8249 Family history of ischemic heart disease and other diseases of the circulatory system: Secondary | ICD-10-CM

## 2022-10-20 DIAGNOSIS — L89612 Pressure ulcer of right heel, stage 2: Secondary | ICD-10-CM | POA: Diagnosis not present

## 2022-10-20 DIAGNOSIS — I214 Non-ST elevation (NSTEMI) myocardial infarction: Secondary | ICD-10-CM | POA: Diagnosis not present

## 2022-10-20 DIAGNOSIS — I110 Hypertensive heart disease with heart failure: Secondary | ICD-10-CM | POA: Diagnosis not present

## 2022-10-20 DIAGNOSIS — Z8673 Personal history of transient ischemic attack (TIA), and cerebral infarction without residual deficits: Secondary | ICD-10-CM | POA: Diagnosis not present

## 2022-10-20 DIAGNOSIS — I69954 Hemiplegia and hemiparesis following unspecified cerebrovascular disease affecting left non-dominant side: Secondary | ICD-10-CM

## 2022-10-20 DIAGNOSIS — H35039 Hypertensive retinopathy, unspecified eye: Secondary | ICD-10-CM | POA: Diagnosis not present

## 2022-10-20 DIAGNOSIS — H3530 Unspecified macular degeneration: Secondary | ICD-10-CM | POA: Diagnosis present

## 2022-10-20 DIAGNOSIS — R918 Other nonspecific abnormal finding of lung field: Secondary | ICD-10-CM

## 2022-10-20 DIAGNOSIS — M25569 Pain in unspecified knee: Secondary | ICD-10-CM | POA: Diagnosis not present

## 2022-10-20 DIAGNOSIS — Z90710 Acquired absence of both cervix and uterus: Secondary | ICD-10-CM

## 2022-10-20 DIAGNOSIS — R0602 Shortness of breath: Secondary | ICD-10-CM | POA: Diagnosis not present

## 2022-10-20 DIAGNOSIS — H409 Unspecified glaucoma: Secondary | ICD-10-CM | POA: Diagnosis not present

## 2022-10-20 DIAGNOSIS — Z7982 Long term (current) use of aspirin: Secondary | ICD-10-CM

## 2022-10-20 DIAGNOSIS — I10 Essential (primary) hypertension: Secondary | ICD-10-CM | POA: Diagnosis not present

## 2022-10-20 DIAGNOSIS — I2721 Secondary pulmonary arterial hypertension: Secondary | ICD-10-CM | POA: Diagnosis present

## 2022-10-20 DIAGNOSIS — I509 Heart failure, unspecified: Secondary | ICD-10-CM | POA: Diagnosis not present

## 2022-10-20 DIAGNOSIS — E7800 Pure hypercholesterolemia, unspecified: Secondary | ICD-10-CM | POA: Diagnosis not present

## 2022-10-20 DIAGNOSIS — K219 Gastro-esophageal reflux disease without esophagitis: Secondary | ICD-10-CM | POA: Diagnosis present

## 2022-10-20 DIAGNOSIS — N3000 Acute cystitis without hematuria: Secondary | ICD-10-CM | POA: Diagnosis not present

## 2022-10-20 DIAGNOSIS — R410 Disorientation, unspecified: Secondary | ICD-10-CM | POA: Diagnosis not present

## 2022-10-20 DIAGNOSIS — B961 Klebsiella pneumoniae [K. pneumoniae] as the cause of diseases classified elsewhere: Secondary | ICD-10-CM | POA: Diagnosis present

## 2022-10-20 LAB — TROPONIN I (HIGH SENSITIVITY)
Troponin I (High Sensitivity): 12 ng/L (ref <18)
Troponin I (High Sensitivity): 14 ng/L (ref <18)

## 2022-10-20 LAB — COMPREHENSIVE METABOLIC PANEL
ALT: 23 U/L (ref 0–44)
AST: 26 U/L (ref 15–41)
Albumin: 3.5 g/dL (ref 3.5–5.0)
Alkaline Phosphatase: 110 U/L (ref 38–126)
Anion gap: 8 (ref 5–15)
BUN: 63 mg/dL — ABNORMAL HIGH (ref 8–23)
CO2: 22 mmol/L (ref 22–32)
Calcium: 9.4 mg/dL (ref 8.9–10.3)
Chloride: 110 mmol/L (ref 98–111)
Creatinine, Ser: 1.51 mg/dL — ABNORMAL HIGH (ref 0.44–1.00)
GFR, Estimated: 31 mL/min — ABNORMAL LOW (ref >60)
Glucose, Bld: 111 mg/dL — ABNORMAL HIGH (ref 70–99)
Potassium: 5.4 mmol/L — ABNORMAL HIGH (ref 3.5–5.1)
Sodium: 140 mmol/L (ref 135–145)
Total Bilirubin: 0.3 mg/dL (ref 0.3–1.2)
Total Protein: 6.5 g/dL (ref 6.5–8.1)

## 2022-10-20 LAB — CBC WITH DIFFERENTIAL/PLATELET
Abs Immature Granulocytes: 0.08 10*3/uL — ABNORMAL HIGH (ref 0.00–0.07)
Basophils Absolute: 0 10*3/uL (ref 0.0–0.1)
Basophils Relative: 0 %
Eosinophils Absolute: 0.1 10*3/uL (ref 0.0–0.5)
Eosinophils Relative: 1 %
HCT: 32.2 % — ABNORMAL LOW (ref 36.0–46.0)
Hemoglobin: 9.4 g/dL — ABNORMAL LOW (ref 12.0–15.0)
Immature Granulocytes: 1 %
Lymphocytes Relative: 14 %
Lymphs Abs: 1.3 10*3/uL (ref 0.7–4.0)
MCH: 26.6 pg (ref 26.0–34.0)
MCHC: 29.2 g/dL — ABNORMAL LOW (ref 30.0–36.0)
MCV: 91 fL (ref 80.0–100.0)
Monocytes Absolute: 0.8 10*3/uL (ref 0.1–1.0)
Monocytes Relative: 8 %
Neutro Abs: 7 10*3/uL (ref 1.7–7.7)
Neutrophils Relative %: 76 %
Platelets: 255 10*3/uL (ref 150–400)
RBC: 3.54 MIL/uL — ABNORMAL LOW (ref 3.87–5.11)
RDW: 16.7 % — ABNORMAL HIGH (ref 11.5–15.5)
WBC: 9.2 10*3/uL (ref 4.0–10.5)
nRBC: 1.1 % — ABNORMAL HIGH (ref 0.0–0.2)

## 2022-10-20 LAB — RESP PANEL BY RT-PCR (RSV, FLU A&B, COVID)  RVPGX2
Influenza A by PCR: NEGATIVE
Influenza B by PCR: NEGATIVE
Resp Syncytial Virus by PCR: NEGATIVE
SARS Coronavirus 2 by RT PCR: NEGATIVE

## 2022-10-20 LAB — BRAIN NATRIURETIC PEPTIDE: B Natriuretic Peptide: 580.4 pg/mL — ABNORMAL HIGH (ref 0.0–100.0)

## 2022-10-20 MED ORDER — FUROSEMIDE 10 MG/ML IJ SOLN
60.0000 mg | Freq: Once | INTRAMUSCULAR | Status: AC
  Administered 2022-10-20: 60 mg via INTRAVENOUS
  Filled 2022-10-20: qty 6

## 2022-10-20 MED ORDER — IOHEXOL 300 MG/ML  SOLN
100.0000 mL | Freq: Once | INTRAMUSCULAR | Status: AC | PRN
  Administered 2022-10-20: 75 mL via INTRAVENOUS

## 2022-10-20 NOTE — Telephone Encounter (Signed)
Pt c/o swelling: STAT is pt has developed SOB within 24 hours  If swelling, where is the swelling located? Hands and arms and legs,   How much weight have you gained and in what time span? Not sure  Have you gained 3 pounds in a day or 5 pounds in a week? Not sure  Do you have a log of your daily weights (if so, list)? Christmas she was around 130's, today she is 145.2 lbs  Are you currently taking a fluid pill? Yes, furosemide 20 mg  Are you currently SOB? no  Have you traveled recently? no   Patient's daughter states she was told by her niece that the patient is having swelling in her hands and arms. She says she does not live with the patient. She says the patient does still have swelling in her legs, but she has been having that already and wears una boots. She says she could not verify the patient's weights. She says she last saw the patient on christmas and at that time her weight was in the 130's. She says today they told her the patient is 145.2 lbs. She says she has not been made aware of any other symptoms. Phone: [phone]

## 2022-10-20 NOTE — Progress Notes (Signed)
Plan of Care Note for accepted transfer   Patient: [name] MRN: [id number]   DOA: 10/20/2022  Facility requesting transfer: [facility]   Requesting Provider: Dr. Mayra Neer  Reason for transfer: Acute CHF   Facility course: 89 yr old lady with HTN, CKD IIIb, CVA, HFpEF presents with weight gain, increased swelling, and DOE. Vitals stable on rm air. SCr is 1.51 (baseline appears closer to 1.2), Hgb 9.4 (11.2 in April), and BNP 580. CXR concerning for new right perihilar mass and small right pleural effusion. Troponin, respiratory virus panel, and chest CT are pending. She was given 60 mg IV Lasix.    Plan of care: The patient is accepted for admission to Telemetry unit, at [facility].   Author: Vianne Bulls, MD 10/20/2022  Check [email or web address] for on-call coverage.  Nursing staff, Please call [facility] number on Amion as soon as patient arrives so that the appropriate admitting provider can be called to evaluate the pt.

## 2022-10-20 NOTE — ED Provider Notes (Signed)
[facility] EMERGENCY DEPT Provider Note   CSN: [id number] Arrival date & time: 10/20/22  1635     History  Chief Complaint  Patient presents with   Leg Swelling   Shortness of Breath    [name] is a 89 y.o. female with history of TIA/CVA with residual facial droop, HLD, HTN, CKD stage III, chronic diastolic heart failure, primary pulmonary hypertension, ataxia, history of NSTEMI presents with SOB/leg swelling.   Pt arrives to ED accompanied by her daughter c/o generalized swelling x1 week. She notes weight gain, wheezing, increased leg swelling, and SOB.  Takes diuretics at home and has been compliant. She endorses pain in her R foot which daughter states is chronic. She denies chest pain, f/c, sick contacts. Daughter states that she or her sister stay with her mother to help care for her. She is ambulatory with a walker. Patient is extremely hard of hearing and majority of history is provided by daughter.    Shortness of Breath      Home Medications Prior to Admission medications   Medication Sig Start Date End Date Taking? Authorizing Provider  Acetaminophen 325 MG CAPS Take 325 mg by mouth 3 (three) times daily as needed.    [provider]  amLODipine (NORVASC) 2.5 MG tablet Take 1 tablet (2.5 mg total) by mouth daily. 01/18/22 02/17/22  Lelon Perla, MD  aspirin 81 MG chewable tablet Chew 81 mg by mouth daily.    [provider]  atorvastatin (LIPITOR) 80 MG tablet Take 1 tablet (80 mg total) by mouth daily at 6 PM. 08/20/15   Ghimire, Henreitta Leber, MD  brimonidine (ALPHAGAN) 0.2 % ophthalmic solution Place 1 drop into both eyes in the morning and at bedtime.    [provider]  Calcium Carbonate-Vitamin D (CALTRATE 600+D PO) Take 1 tablet by mouth daily.    [provider]  chlorpheniramine (CHLOR-TRIMETON) 4 MG tablet Take 4 mg by mouth daily.    [provider]  [facility] SOD CHLORIDE HYPERTONICITY 5 % ophthalmic  ointment PLACE 1 APPLICATION INTO THE RIGHT EYE AT BEDTIME. 07/01/22   Bernarda Caffey, MD  denosumab (PROLIA) 60 MG/ML SOSY injection 60 mg See admin instructions. 12/18/17   [provider]  doxycycline (VIBRA-TABS) 100 MG tablet Take 1 tablet (100 mg total) by mouth 2 (two) times daily. 08/10/22   Edrick Kins, DPM  famotidine (PEPCID) 20 MG tablet Take 20 mg by mouth daily.    [provider]  FLUZONE HIGH-DOSE QUADRIVALENT 0.7 ML SUSY  07/14/19   [provider]  furosemide (LASIX) 40 MG tablet Take 20 mg by mouth daily. 12/24/21   [provider]  GEMTESA 75 MG TABS Take 75 mg by mouth daily. 10/15/21   [provider]  gentamicin cream (GARAMYCIN) 0.1 % Apply 1 application topically 2 (two) times daily. 11/29/21   Edrick Kins, DPM  Loteprednol Etabonate (LOTEMAX SM) 0.38 % GEL Place 1 drop into the right eye 2 (two) times daily. 07/01/22   Bernarda Caffey, MD  metoprolol (LOPRESSOR) 50 MG tablet Take 25 mg by mouth 2 (two) times daily.    [provider]  PFIZER-BIONT COVID-19 VAC-TRIS SUSP injection  05/08/21   [provider]  Polyethyl Glycol-Propyl Glycol (SYSTANE OP) Apply 1 drop to eye 2 (two) times daily as needed (dry eyes).    [provider]  sodium chloride (MURO 128) 5 % ophthalmic solution INSTILL 1 DROP INTO RIGHT EYE 4 TIMES  A DAY 08/17/22   Bernarda Caffey, MD  timolol (TIMOPTIC) 0.5 % ophthalmic solution Place 1 drop into both eyes every morning. 11/01/21   [provider]      Allergies    Meloxicam, Nitrofurantoin, Other, Guaifenesin er, and Nitrofuran derivatives    Review of Systems   Review of Systems  Respiratory:  Positive for shortness of breath.    Review of systems Negative for f/c.  A 10 point review of systems was performed and is negative unless otherwise reported in HPI.  Physical Exam Updated Vital Signs BP (!) 131/95 (BP Location: Right Arm)   Pulse (!) 54   Temp 97.6 F (36.4 C)  (Temporal)   Resp 16   Ht 5' (1.524 m)   Wt 64.9 kg   SpO2 100%   BMI 27.93 kg/m  Physical Exam General: Normal appearing female, lying in bed.  HEENT: PERRLA, Sclera anicteric, MMM, trachea midline.  Cardiology: RRR, no murmurs/rubs/gallops. BL radial and DP pulses equal bilaterally.  Resp: Normal respiratory rate and effort. CTAB, no wheezes, rhonchi, crackles.  Abd: Soft, non-tender, non-distended. No rebound tenderness or guarding.  GU: Deferred. MSK: No peripheral edema or signs of trauma. Extremities without deformity or TTP. No cyanosis or clubbing. Skin: warm, dry. No rashes or lesions. Back: No CVA tenderness Neuro: A&Ox4, CNs II-XII grossly intact. MAEs. Sensation grossly intact.  Psych: Normal mood and affect.   ED Results / Procedures / Treatments   Labs (all labs ordered are listed, but only abnormal results are displayed) Labs Reviewed  CBC WITH DIFFERENTIAL/PLATELET - Abnormal; Notable for the following components:      Result Value   RBC 3.54 (*)    Hemoglobin 9.4 (*)    HCT 32.2 (*)    MCHC 29.2 (*)    RDW 16.7 (*)    nRBC 1.1 (*)    Abs Immature Granulocytes 0.08 (*)    All other components within normal limits  COMPREHENSIVE METABOLIC PANEL - Abnormal; Notable for the following components:   Potassium 5.4 (*)    Glucose, Bld 111 (*)    BUN 63 (*)    Creatinine, Ser 1.51 (*)    GFR, Estimated 31 (*)    All other components within normal limits    EKG EKG Interpretation  Date/Time:  Thursday October 20 2022 18:21:24 EST Ventricular Rate:  54 PR Interval:  165 QRS Duration: 114 QT Interval:  478 QTC Calculation: 453 R Axis:   71 Text Interpretation: Sinus rhythm  Similar to prior EKGs Confirmed by Cindee Lame [id number]) on 10/20/2022 6:48:32 PM  Radiology DG Chest Port 1 View  Result Date: 10/20/2022 CLINICAL DATA:  Shortness of breath EXAM: PORTABLE CHEST 1 VIEW COMPARISON:  Chest x-ray 07/24/2017.  Chest CT 04/07/2015. FINDINGS: There is a new  right hilar/perihilar masslike density measuring 4.0 x 4.2 cm. There is a new small right pleural effusion. The cardiac silhouette is within normal limits. No evidence for pneumothorax or acute fracture. IMPRESSION: 1. New right hilar/perihilar masslike density measuring 4.0 x 4.2 cm. This is concerning for malignancy. Recommend further evaluation with chest CT with contrast. 2. New small right pleural effusion. Electronically Signed   By: Ronney Asters M.D.   On: 10/20/2022 18:25    Procedures Procedures    Medications Ordered in ED Medications  furosemide (LASIX) injection 60 mg (60 mg Intravenous Given 10/20/22 1946)  iohexol (OMNIPAQUE) 300 MG/ML solution 100 mL (75 mLs Intravenous Contrast Given 10/20/22 1922)    ED  Course/ Medical Decision Making/ A&P                          Medical Decision Making Amount and/or Complexity of Data Reviewed Labs: ordered. Decision-making details documented in ED Course. Radiology: ordered. Decision-making details documented in ED Course.  Risk Prescription drug management. Decision regarding hospitalization.    This patient presents to the ED for concern of generalized swelling and SOB, this involves an extensive number of treatment options, and is a complaint that carries with it a high risk of complications and morbidity.  I considered the following differential and admission for this acute, potentially life threatening condition.   MDM:    Consider volume overload/pulm edema and heart failure exacerbation at top of differential. Also consider PNA, pleural effusion. She has audible wheezing and crackles on exam, she has no h/o obstructive lung disease, consider pulm edema. She is not hypoxic, but she does have mildly increased WOB on exam and is slightly tachypneic. She has no CP to indicate ACS but will evaluate with EKG/trop.   Clinical Course as of 10/21/22 0219  Thu Oct 20, 2022  1846 Hemoglobin(!): 9.4 BL 10-12.  [HN]  1846 WBC: 9.2 No  leukoctyosis [HN]  1847 Creatinine(!): 1.51 At BL [HN]  1847 Potassium(!): 5.4 [HN]  1847 B Natriuretic Peptide(!): 580.4 [HN]  1903 DG Chest Port 1 View 1. New right hilar/perihilar masslike density measuring 4.0 x 4.2 cm. This is concerning for malignancy. Recommend further evaluation with chest CT with contrast. 2. New small right pleural effusion.   [HN]  7939 In s/o diastolic HF exacerbation and increased WOB, will diurese patient with 60 mg IV lasix. CXR demonstrates pleural effusion and R perihilar mass c/f malignancy. Ordered CT chest w/ contrast. Also w/ hyperkalemia with EKG similar to priors. Renal function at her increased baseline. Will consult to medicine for admission. Trop pending. [HN]  1926 Admitted to medicine [HN]    Clinical Course User Index [HN] Audley Hose, MD    Labs: I Ordered, and personally interpreted labs.  The pertinent results include:  those listed above  Imaging Studies ordered: I ordered imaging studies including CXR I independently visualized and interpreted imaging. I agree with the radiologist interpretation  Additional history obtained from daughter at bedside, chart review.    Cardiac Monitoring: The patient was maintained on a cardiac monitor.  I personally viewed and interpreted the cardiac monitored which showed an underlying rhythm of: NSR  Reevaluation: After the interventions noted above, I reevaluated the patient and found that they have :stayed the same  Social Determinants of Health: Patient lives independently with help from daughters  Disposition:  Patient and her daughter are informed of the XR findings and need for CT chest. Patient is admitted to medicine for HF exacerbation, pleural effusion, new R hilar mass.  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  I have reviewed the patients home medicines and have made adjustments as needed  Problem List / ED Course: Problem List Items Addressed This Visit       Other   Hyperkalemia   Other Visit Diagnoses     Acute on chronic diastolic congestive heart failure (HCC)    -  Primary   Relevant Medications   furosemide (LASIX) injection 60 mg (Completed)   aspirin chewable tablet 81 mg   atorvastatin (LIPITOR) tablet 80 mg (Start on 10/21/2022  6:00 PM)   metoprolol tartrate (LOPRESSOR) tablet 25 mg   amLODipine (NORVASC) tablet 2.5 mg (Start on 10/21/2022  4:00 PM)   Pleural effusion, right       Mass of right lung       Bilateral lower extremity edema                       This note was created using dictation software, which may contain spelling or grammatical errors.    Loetta Rough, MD 10/21/22 [phone]

## 2022-10-20 NOTE — ED Triage Notes (Signed)
Pt arrives to ED with c/o generalized swelling x1 week. She notes weight gain, wheezing, increased leg swelling, and SOB.

## 2022-10-20 NOTE — Telephone Encounter (Signed)
Spoke with daughter who reported patient has gained 15 pounds in 3 weeks. Her arms and hands are edematous. Last creatinine 09/2022 was 1.7. Patient has exertional SOB  with possible wheezing. Last OV was Sept. 2022. Spoke with Dr. Margaretann Loveless (DOD) who recommended patient be taken to ED [facility]. Daughter advised and stated she will take patient to [facility]. Appointment made with Thomasene Mohair for 2/19.

## 2022-10-21 ENCOUNTER — Observation Stay (HOSPITAL_COMMUNITY): Payer: Medicare Other

## 2022-10-21 ENCOUNTER — Other Ambulatory Visit (HOSPITAL_COMMUNITY): Payer: Medicare Other

## 2022-10-21 DIAGNOSIS — J90 Pleural effusion, not elsewhere classified: Secondary | ICD-10-CM | POA: Diagnosis not present

## 2022-10-21 DIAGNOSIS — I2721 Secondary pulmonary arterial hypertension: Secondary | ICD-10-CM | POA: Diagnosis not present

## 2022-10-21 DIAGNOSIS — N1832 Chronic kidney disease, stage 3b: Secondary | ICD-10-CM | POA: Diagnosis not present

## 2022-10-21 DIAGNOSIS — I2510 Atherosclerotic heart disease of native coronary artery without angina pectoris: Secondary | ICD-10-CM | POA: Diagnosis not present

## 2022-10-21 DIAGNOSIS — I130 Hypertensive heart and chronic kidney disease with heart failure and stage 1 through stage 4 chronic kidney disease, or unspecified chronic kidney disease: Secondary | ICD-10-CM | POA: Diagnosis not present

## 2022-10-21 DIAGNOSIS — Z1152 Encounter for screening for COVID-19: Secondary | ICD-10-CM | POA: Diagnosis not present

## 2022-10-21 DIAGNOSIS — J9601 Acute respiratory failure with hypoxia: Secondary | ICD-10-CM | POA: Diagnosis not present

## 2022-10-21 DIAGNOSIS — N390 Urinary tract infection, site not specified: Secondary | ICD-10-CM | POA: Diagnosis not present

## 2022-10-21 DIAGNOSIS — Z8249 Family history of ischemic heart disease and other diseases of the circulatory system: Secondary | ICD-10-CM | POA: Diagnosis not present

## 2022-10-21 DIAGNOSIS — I5033 Acute on chronic diastolic (congestive) heart failure: Secondary | ICD-10-CM | POA: Diagnosis not present

## 2022-10-21 DIAGNOSIS — Z79899 Other long term (current) drug therapy: Secondary | ICD-10-CM | POA: Diagnosis not present

## 2022-10-21 DIAGNOSIS — I252 Old myocardial infarction: Secondary | ICD-10-CM | POA: Diagnosis not present

## 2022-10-21 DIAGNOSIS — E785 Hyperlipidemia, unspecified: Secondary | ICD-10-CM | POA: Diagnosis not present

## 2022-10-21 DIAGNOSIS — E860 Dehydration: Secondary | ICD-10-CM | POA: Diagnosis not present

## 2022-10-21 DIAGNOSIS — I69954 Hemiplegia and hemiparesis following unspecified cerebrovascular disease affecting left non-dominant side: Secondary | ICD-10-CM | POA: Diagnosis not present

## 2022-10-21 DIAGNOSIS — D509 Iron deficiency anemia, unspecified: Secondary | ICD-10-CM | POA: Diagnosis not present

## 2022-10-21 DIAGNOSIS — Z955 Presence of coronary angioplasty implant and graft: Secondary | ICD-10-CM | POA: Diagnosis not present

## 2022-10-21 DIAGNOSIS — H35033 Hypertensive retinopathy, bilateral: Secondary | ICD-10-CM | POA: Diagnosis not present

## 2022-10-21 DIAGNOSIS — K219 Gastro-esophageal reflux disease without esophagitis: Secondary | ICD-10-CM | POA: Diagnosis not present

## 2022-10-21 DIAGNOSIS — E875 Hyperkalemia: Secondary | ICD-10-CM | POA: Diagnosis present

## 2022-10-21 DIAGNOSIS — H409 Unspecified glaucoma: Secondary | ICD-10-CM | POA: Diagnosis not present

## 2022-10-21 DIAGNOSIS — Z66 Do not resuscitate: Secondary | ICD-10-CM | POA: Diagnosis not present

## 2022-10-21 DIAGNOSIS — E8809 Other disorders of plasma-protein metabolism, not elsewhere classified: Secondary | ICD-10-CM | POA: Diagnosis not present

## 2022-10-21 DIAGNOSIS — E041 Nontoxic single thyroid nodule: Secondary | ICD-10-CM | POA: Diagnosis not present

## 2022-10-21 DIAGNOSIS — T68XXXA Hypothermia, initial encounter: Secondary | ICD-10-CM | POA: Diagnosis present

## 2022-10-21 DIAGNOSIS — I509 Heart failure, unspecified: Secondary | ICD-10-CM | POA: Diagnosis not present

## 2022-10-21 DIAGNOSIS — Z96652 Presence of left artificial knee joint: Secondary | ICD-10-CM | POA: Diagnosis not present

## 2022-10-21 LAB — CBC
HCT: 31.4 % — ABNORMAL LOW (ref 36.0–46.0)
Hemoglobin: 9.1 g/dL — ABNORMAL LOW (ref 12.0–15.0)
MCH: 26.8 pg (ref 26.0–34.0)
MCHC: 29 g/dL — ABNORMAL LOW (ref 30.0–36.0)
MCV: 92.6 fL (ref 80.0–100.0)
Platelets: 212 10*3/uL (ref 150–400)
RBC: 3.39 MIL/uL — ABNORMAL LOW (ref 3.87–5.11)
RDW: 16.6 % — ABNORMAL HIGH (ref 11.5–15.5)
WBC: 6.5 10*3/uL (ref 4.0–10.5)
nRBC: 0.9 % — ABNORMAL HIGH (ref 0.0–0.2)

## 2022-10-21 LAB — URINALYSIS, ROUTINE W REFLEX MICROSCOPIC
Bacteria, UA: NONE SEEN
Bilirubin Urine: NEGATIVE
Glucose, UA: NEGATIVE mg/dL
Hgb urine dipstick: NEGATIVE
Ketones, ur: NEGATIVE mg/dL
Nitrite: NEGATIVE
Protein, ur: 30 mg/dL — AB
Specific Gravity, Urine: 1.021 (ref 1.005–1.030)
WBC, UA: >50 WBC/hpf — ABNORMAL HIGH (ref 0–5)
pH: 5 (ref 5.0–8.0)

## 2022-10-21 LAB — BASIC METABOLIC PANEL
Anion gap: 7 (ref 5–15)
BUN: 61 mg/dL — ABNORMAL HIGH (ref 8–23)
CO2: 25 mmol/L (ref 22–32)
Calcium: 9.3 mg/dL (ref 8.9–10.3)
Chloride: 108 mmol/L (ref 98–111)
Creatinine, Ser: 1.49 mg/dL — ABNORMAL HIGH (ref 0.44–1.00)
GFR, Estimated: 32 mL/min — ABNORMAL LOW (ref >60)
Glucose, Bld: 94 mg/dL (ref 70–99)
Potassium: 5.2 mmol/L — ABNORMAL HIGH (ref 3.5–5.1)
Sodium: 140 mmol/L (ref 135–145)

## 2022-10-21 LAB — PHOSPHORUS: Phosphorus: 5.7 mg/dL — ABNORMAL HIGH (ref 2.5–4.6)

## 2022-10-21 LAB — TSH: TSH: 3.088 u[IU]/mL (ref 0.350–4.500)

## 2022-10-21 LAB — MRSA NEXT GEN BY PCR, NASAL: MRSA by PCR Next Gen: NOT DETECTED

## 2022-10-21 LAB — MAGNESIUM: Magnesium: 2.1 mg/dL (ref 1.7–2.4)

## 2022-10-21 MED ORDER — METOPROLOL TARTRATE 25 MG PO TABS
25.0000 mg | ORAL_TABLET | Freq: Two times a day (BID) | ORAL | Status: DC
  Administered 2022-10-21: 25 mg via ORAL
  Filled 2022-10-21: qty 1

## 2022-10-21 MED ORDER — ASPIRIN 81 MG PO CHEW
81.0000 mg | CHEWABLE_TABLET | Freq: Every day | ORAL | Status: DC
  Administered 2022-10-21 – 2022-10-31 (×11): 81 mg via ORAL
  Filled 2022-10-21 (×11): qty 1

## 2022-10-21 MED ORDER — CHLORHEXIDINE GLUCONATE CLOTH 2 % EX PADS
6.0000 | MEDICATED_PAD | Freq: Every day | CUTANEOUS | Status: DC
  Administered 2022-10-21 – 2022-10-23 (×3): 6 via TOPICAL

## 2022-10-21 MED ORDER — ACETAMINOPHEN 650 MG RE SUPP: 650.0000 mg | Freq: Four times a day (QID) | RECTAL | Status: DC | PRN

## 2022-10-21 MED ORDER — ACETAMINOPHEN 325 MG PO TABS
650.0000 mg | ORAL_TABLET | Freq: Four times a day (QID) | ORAL | Status: DC | PRN
  Administered 2022-10-24 – 2022-10-27 (×7): 650 mg via ORAL
  Filled 2022-10-21 (×7): qty 2

## 2022-10-21 MED ORDER — BRIMONIDINE TARTRATE 0.2 % OP SOLN
1.0000 [drp] | Freq: Two times a day (BID) | OPHTHALMIC | Status: DC
  Administered 2022-10-21 – 2022-10-31 (×20): 1 [drp] via OPHTHALMIC
  Filled 2022-10-21 (×3): qty 5

## 2022-10-21 MED ORDER — POLYETHYL GLYCOL-PROPYL GLYCOL 0.4-0.3 % OP GEL
Freq: Two times a day (BID) | OPHTHALMIC | Status: DC | PRN
  Filled 2022-10-21: qty 10

## 2022-10-21 MED ORDER — ATORVASTATIN CALCIUM 40 MG PO TABS
80.0000 mg | ORAL_TABLET | Freq: Every day | ORAL | Status: DC
  Administered 2022-10-21 – 2022-10-31 (×10): 80 mg via ORAL
  Filled 2022-10-21 (×10): qty 2

## 2022-10-21 MED ORDER — MIRABEGRON ER 25 MG PO TB24
25.0000 mg | ORAL_TABLET | Freq: Every day | ORAL | Status: DC
  Administered 2022-10-21 – 2022-10-31 (×11): 25 mg via ORAL
  Filled 2022-10-21 (×11): qty 1

## 2022-10-21 MED ORDER — SODIUM CHLORIDE 0.9 % IV SOLN
1.0000 g | INTRAVENOUS | Status: DC
  Filled 2022-10-21: qty 10

## 2022-10-21 MED ORDER — FAMOTIDINE 20 MG PO TABS
20.0000 mg | ORAL_TABLET | Freq: Every day | ORAL | Status: DC
  Administered 2022-10-21 – 2022-10-31 (×11): 20 mg via ORAL
  Filled 2022-10-21 (×11): qty 1

## 2022-10-21 MED ORDER — AMLODIPINE BESYLATE 5 MG PO TABS
2.5000 mg | ORAL_TABLET | Freq: Every day | ORAL | Status: DC
  Administered 2022-10-22 – 2022-10-31 (×10): 2.5 mg via ORAL
  Filled 2022-10-21 (×11): qty 1

## 2022-10-21 MED ORDER — FUROSEMIDE 10 MG/ML IJ SOLN
20.0000 mg | Freq: Once | INTRAMUSCULAR | Status: AC
  Administered 2022-10-21: 20 mg via INTRAVENOUS
  Filled 2022-10-21: qty 2

## 2022-10-21 MED ORDER — PROCHLORPERAZINE EDISYLATE 10 MG/2ML IJ SOLN
5.0000 mg | INTRAMUSCULAR | Status: DC | PRN
  Administered 2022-10-22 – 2022-10-27 (×2): 5 mg via INTRAVENOUS
  Filled 2022-10-21 (×2): qty 2

## 2022-10-21 MED ORDER — LOTEPREDNOL ETABONATE 0.38 % OP GEL
1.0000 [drp] | Freq: Two times a day (BID) | OPHTHALMIC | Status: DC
  Administered 2022-10-28 – 2022-10-31 (×6): 1 [drp] via OPHTHALMIC

## 2022-10-21 MED ORDER — SODIUM CHLORIDE 0.9 % IV SOLN
1000.0000 mg | Freq: Two times a day (BID) | INTRAVENOUS | Status: DC
  Administered 2022-10-21 – 2022-10-25 (×8): 1000 mg via INTRAVENOUS
  Filled 2022-10-21 (×8): qty 20

## 2022-10-21 NOTE — ED Notes (Signed)
Report given to [facility] staff and [facility] inpatient nurse

## 2022-10-21 NOTE — ED Notes (Signed)
Called [facility] for transport at1:06

## 2022-10-21 NOTE — Progress Notes (Addendum)
Patient is alert, oriented x3, disoriented to time. HOH. Daughter is at bedside. Temperature remains low after heated blankets and hot packs applied.

## 2022-10-21 NOTE — Progress Notes (Signed)
PHARMACY NOTE:  ANTIMICROBIAL RENAL DOSAGE ADJUSTMENT  Current antimicrobial regimen includes a mismatch between antimicrobial dosage and estimated renal function.  As per policy approved by the Pharmacy & Therapeutics and Medical Executive Committees, the antimicrobial dosage will be adjusted accordingly.  Current antimicrobial dosage:  Meropenem 500 mg q12h  Indication: hx ESBL UTI  Renal Function:  Estimated Creatinine Clearance: 18.1 mL/min (A) (by C-G formula based on SCr of 1.49 mg/dL (H)).    Antimicrobial dosage has been changed to:  Meropenem 1 gm IV q12h  Thank you for allowing pharmacy to be a part of this patient's care.  Eudelia Bunch, Pharm.D Use secure chat for questions 10/21/2022 7:13 PM

## 2022-10-21 NOTE — ED Notes (Addendum)
Pt HR 36 on EKG leads and hypoxic. Upon entering room pt easily arousable to loud verbal stimuli due to [facility]. Pt HR and O2 immediately improved without intervention when waking up. Daughter denies this ever happening before. Pt placed on 3L [place] to prevent desaturations while sleeping and MD delo notified.

## 2022-10-21 NOTE — H&P (Signed)
History and Physical    Patient: [name] JOA:[id number] DOB: 1922/12/19 DOA: 10/20/2022 DOS: the patient was seen and examined on 10/21/2022 PCP: [name], DO  Patient coming from: Home  Chief Complaint:  Chief Complaint  Patient presents with   Leg Swelling   Shortness of Breath   HPI: [name] is a 89 y.o. 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   No chest pain, palpitations, diaphoresis, PND or orthopnea No fever, chills or night sweats. No sore throat, rhinorrhea, dyspnea, wheezing or hemoptysis  No appetite changes, abdominal pain, diarrhea, constipation, melena or hematochezia.  No flank pain, dysuria, frequency or hematuria.  No polyuria, polydipsia, polyphagia or blurred vision.    The patient was hypothermic on arrival with a temperature of 91.1 F.  She was transferred to the stepdown unit.  Urine analysis showed pyuria, but no bacteriuria.  Given hypothermia and history of ESBL UTI, urine culture and sensitivity order followed by meropenem 500 mg twice a day IVPB.  ED course: Initial vital signs were temperature 97.6 F, pulse 54, respirations 16, BP 131/95 mmHg O2 sat 100% on room air.  The patient is now on nasal cannula oxygen at 2 LPM most recent O2 sat is 100%.  She received furosemide 60 mg IVP and her home meds were ordered in the ED including metoprolol.  Lab work: Coronavirus, RSV and influenza PCR was negative.  CBC is her white count 9.2, hemoglobin 9.4 g/dL platelets 255.  Troponin x 2 normal.  BNP 580.4 pg/mL.  CMP showed a potassium of 5.4 mmol/L, glucose 111, BUN 63 and creatinine 1.51 mg/dL.  The rest of  the CMP measurements were normal.  Baseline creatinine in the last 2 years usually ranges between 1.2 and 1.6 mg/deciliter.  Imaging: Portable 1 view chest radiograph with a new right hilar/perihilar masslike density measuring 4.0 x 4.2 cm concerning for malignancy.  There is a new small right pleural effusion.  CT chest with contrast with marked enlargement of the main pulmonary artery and right pulmonary artery compatible with pulmonary hypertension.  No hilar mass identified.  Small bilateral pleural effusions with bibasilar compressive atelectasis.  Central peribronchial wall thickening that may be infectious or inflammatory.  Cardiomegaly.  1 cm incidental left thyroid nodule with no follow-up recommended.  Review of Systems: As mentioned in the history of present illness. All other systems reviewed and are negative. 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 ([place])    Fall at home 09/26/2012   Glaucoma    OU   Gout, joint    Hypertension    Hypertensive retinopathy    OU   Macular degeneration    OU   NSTEMI (non-ST elevated myocardial infarction) ([place]) 08/2015   Stroke [facility])    Past Surgical History:  Procedure Laterality Date   ABDOMINAL HYSTERECTOMY     BREAST SURGERY     CYST   CARDIAC CATHETERIZATION N/A 08/18/2015   Procedure: Left Heart Cath and Coronary Angiography;  Surgeon: Troy Sine, MD;  Location: [facility];  Service: Cardiovascular;  Laterality: N/A;  CARDIAC CATHETERIZATION  08/18/2015   Procedure: Coronary Stent Intervention;  Surgeon: Troy Sine, MD;  Location: [facility];  Service: Cardiovascular;;   CATARACT EXTRACTION Bilateral    DILATION AND CURETTAGE OF UTERUS     EYE SURGERY Bilateral    HERNIA REPAIR     IRIDOTOMY / IRIDECTOMY Right    JOINT REPLACEMENT  left knee   REFRACTIVE SURGERY Right 2017   Social History:  reports that  she has never smoked. She has never used smokeless tobacco. She reports that she does not drink alcohol and does not use drugs.  Allergies  Allergen Reactions   Meloxicam Hives   Nitrofurantoin    Other Other (See Comments)   Guaifenesin Er Rash    Other reaction(s): rash   Nitrofuran Derivatives Rash    Family History  Problem Relation Age of Onset   Heart disease Mother    Stroke Mother     Prior to Admission medications   Medication Sig Start Date End Date Taking? Authorizing Provider  amLODipine (NORVASC) 2.5 MG tablet Take 1 tablet (2.5 mg total) by mouth daily. 01/18/22 02/17/22 Yes Lelon Perla, MD  aspirin 81 MG chewable tablet Chew 81 mg by mouth daily.   Yes [provider]  atorvastatin (LIPITOR) 80 MG tablet Take 1 tablet (80 mg total) by mouth daily at 6 PM. 08/20/15  Yes Ghimire, Henreitta Leber, MD  brimonidine (ALPHAGAN) 0.2 % ophthalmic solution Place 1 drop into both eyes in the morning and at bedtime.   Yes [provider]  Calcium Carbonate-Vitamin D (CALTRATE 600+D PO) Take 1 tablet by mouth daily. gummy   Yes [provider]  Cranberry Extract 250 MG TABS as directed Orally   Yes [provider]  CVS SOD CHLORIDE HYPERTONICITY 5 % ophthalmic ointment PLACE 1 APPLICATION INTO THE RIGHT EYE AT BEDTIME. 07/01/22  Yes Bernarda Caffey, MD  famotidine (PEPCID) 20 MG tablet Take 20 mg by mouth daily.   Yes [provider]  furosemide (LASIX) 40 MG tablet Take 20 mg by mouth daily. 12/24/21  Yes [provider]  GEMTESA 75 MG TABS Take 75 mg by mouth daily. 10/15/21  Yes [provider]  Loteprednol Etabonate (LOTEMAX SM) 0.38 % GEL Place 1 drop into the right eye 2 (two) times daily. 07/01/22  Yes Bernarda Caffey, MD  metoprolol (LOPRESSOR) 50 MG tablet Take 25 mg by mouth 2 (two) times daily.   Yes [provider]  Polyethyl Glycol-Propyl Glycol (SYSTANE OP) Apply 1 drop to eye 2 (two) times daily as needed  (dry eyes).   Yes [provider]  Acetaminophen 325 MG CAPS Take 325 mg by mouth 3 (three) times daily as needed.    [provider]  chlorpheniramine (CHLOR-TRIMETON) 4 MG tablet Take 4 mg by mouth daily.    [provider]  Cyanocobalamin 1000 MCG TBCR 1 tablet Orally Once a day for 30 day(s)    [provider]  denosumab (PROLIA) 60 MG/ML SOSY injection 60 mg See admin instructions. 12/18/17   [provider]  doxycycline (VIBRA-TABS) 100 MG tablet Take 1 tablet (100 mg total) by mouth 2 (two) times daily. 08/10/22   Edrick Kins, DPM  FLUZONE HIGH-DOSE QUADRIVALENT 0.7 ML SUSY  07/14/19   [provider]  gentamicin cream (GARAMYCIN) 0.1 % Apply 1 application topically 2 (two) times daily. 11/29/21   Edrick Kins, DPM  PFIZER-BIONT COVID-19 VAC-TRIS SUSP injection  05/08/21  [provider]  sodium chloride (MURO 128) 5 % ophthalmic solution INSTILL 1 DROP INTO RIGHT EYE 4 TIMES A DAY 08/17/22   Rennis Chris, MD  timolol (TIMOPTIC) 0.5 % ophthalmic solution Place 1 drop into both eyes every morning. 11/01/21   [provider]    Physical Exam: Vitals:   10/21/22 1252 10/21/22 1254 10/21/22 1300 10/21/22 1413  BP:   (!) 98/57 115/64  Pulse: (!) 49  (!) 45 (!) 48  Resp: 16  11 18   Temp:  98.1 F (36.7 C)    TempSrc:  Oral    SpO2: 100%  100% 100%  Weight:      Height:       Physical Exam Vitals reviewed.  Constitutional:      General: She is not in acute distress.    Appearance: She is ill-appearing.  HENT:     Head: Normocephalic.     Nose: No rhinorrhea.     Mouth/Throat:     Mouth: Mucous membranes are moist.  Eyes:     General: No scleral icterus.    Pupils: Pupils are equal, round, and reactive to light.  Neck:     Vascular: No JVD.  Cardiovascular:     Rate and Rhythm: Normal rate and regular rhythm.  Pulmonary:     Breath sounds: No wheezing, rhonchi or rales.  Abdominal:     General: Bowel  sounds are normal.     Palpations: Abdomen is soft.     Tenderness: There is no abdominal tenderness.  Musculoskeletal:     Cervical back: Neck supple.  Skin:    General: Skin is warm and dry.  Neurological:     General: No focal deficit present.     Mental Status: She is alert. She is disoriented.  Psychiatric:        Mood and Affect: Mood normal.        Behavior: Behavior is cooperative.     Data Reviewed:  Results are pending, will review when available.  08/19/2015 echocardiogram LV EF: 65% -   70%   -------------------------------------------------------------------  Indications:     Dyspnea 786.09.   -------------------------------------------------------------------  History:  PMH:  Shortness of Breath.  Stroke.  Risk factors:  Hypertension.   -------------------------------------------------------------------  Study Conclusions   - Left ventricle: The cavity size was normal. Wall thickness was    increased in a pattern of mild LVH. Systolic function was    vigorous. The estimated ejection fraction was in the range of 65%    to 70%. Wall motion was normal; there were no regional wall    motion abnormalities. Doppler parameters are consistent with    abnormal left ventricular relaxation (grade 1 diastolic    dysfunction).  - Mitral valve: Calcified annulus. Mildly thickened leaflets .    There was mild regurgitation.  - Tricuspid valve: There was moderate regurgitation.  - Pulmonary arteries: PA peak pressure: 60 mm Hg (S).   Assessment and Plan: Principal Problem:   Acute on chronic diastolic CHF (congestive heart failure) (HCC) Observation/SDU. Supplemental oxygen as needed. Sodium and fluid restriction. Furosemide 20 mg IVP x 1. Monitor daily weights, intake and output. Monitor renal function electrolytes. Check echocardiogram. Hold beta-blocker due to bradycardia/CHF exacerbation.  Active Problems:   Hypothermia Warming measures. TSH normal. CXR  with cardiomegaly and edema. Urinalysis with pyuria. Urine culture and sensitivity ordered. Transferred to stepdown. History of ESBL infection so meropenem started.    Primary pulmonary hypertension (HCC) Check echocardiogram.  Essential hypertension Continue amlodipine 2.5 mg p.o. daily. Hold metoprolol due to bradycardia acute CHF. Continue furosemide as above.    CKD (chronic kidney disease), stage IIIb (HCC) Monitor renal function electrolytes closely.    Hyperlipidemia Continue atorvastatin 80 mg p.o. daily.    Gastroesophageal reflux disease Continue famotidine 20 mg p.o. daily.    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 Consider PT evaluation as needed.    Iron deficiency anemia Monitor hematocrit and hemoglobin.    Glaucoma Continue current eyedrops, except for timolol.     Advance Care Planning:   Code Status: DNR   Consults:   Family Communication: Her oldest daughter was at bedside and provided information.  Severity of Illness: The appropriate patient status for this patient is OBSERVATION. Observation status is judged to be reasonable and necessary in order to provide the required intensity of service to ensure the patient's safety. The patient's presenting symptoms, physical exam findings, and initial radiographic and laboratory data in the context of their medical condition is felt to place them at decreased risk for further clinical deterioration. Furthermore, it is anticipated that the patient will be medically stable for discharge from the hospital within 2 midnights of admission.   Author: Bobette Mo, MD 10/21/2022 2:25 PM  For on call review [email or web address].   This document was prepared using Dragon voice recognition software and may contain some unintended transcription errors.

## 2022-10-21 NOTE — ED Triage Notes (Signed)
Patients family would also like to note that the patient is pending a urine culture to rule out a UTI.

## 2022-10-21 NOTE — Telephone Encounter (Signed)
Patient has been seen in the ER 

## 2022-10-21 NOTE — Progress Notes (Addendum)
Patient arrived to unit a&ox4, HOH.  Skin assessed, bruising to bilateral chest. Incisions to rt/lt arms steri strips, wound right lateral thigh stage 2 x 2, one area 1x1 other 1x1.5 red groin bilaterally. [name]

## 2022-10-21 NOTE — Progress Notes (Signed)
The patient complained  of being cold, unable to obtain oral temp on initial admission, rectal temp completed 91.9. Hospitalist provider aware.

## 2022-10-22 ENCOUNTER — Observation Stay (HOSPITAL_COMMUNITY): Payer: Medicare Other

## 2022-10-22 DIAGNOSIS — H35033 Hypertensive retinopathy, bilateral: Secondary | ICD-10-CM | POA: Diagnosis present

## 2022-10-22 DIAGNOSIS — I69954 Hemiplegia and hemiparesis following unspecified cerebrovascular disease affecting left non-dominant side: Secondary | ICD-10-CM | POA: Diagnosis not present

## 2022-10-22 DIAGNOSIS — N1832 Chronic kidney disease, stage 3b: Secondary | ICD-10-CM | POA: Diagnosis present

## 2022-10-22 DIAGNOSIS — E7800 Pure hypercholesterolemia, unspecified: Secondary | ICD-10-CM | POA: Diagnosis not present

## 2022-10-22 DIAGNOSIS — R062 Wheezing: Secondary | ICD-10-CM | POA: Diagnosis not present

## 2022-10-22 DIAGNOSIS — N390 Urinary tract infection, site not specified: Secondary | ICD-10-CM | POA: Diagnosis present

## 2022-10-22 DIAGNOSIS — E041 Nontoxic single thyroid nodule: Secondary | ICD-10-CM | POA: Diagnosis present

## 2022-10-22 DIAGNOSIS — I270 Primary pulmonary hypertension: Secondary | ICD-10-CM

## 2022-10-22 DIAGNOSIS — R0602 Shortness of breath: Secondary | ICD-10-CM

## 2022-10-22 DIAGNOSIS — H409 Unspecified glaucoma: Secondary | ICD-10-CM

## 2022-10-22 DIAGNOSIS — Z79899 Other long term (current) drug therapy: Secondary | ICD-10-CM | POA: Diagnosis not present

## 2022-10-22 DIAGNOSIS — E875 Hyperkalemia: Secondary | ICD-10-CM | POA: Diagnosis present

## 2022-10-22 DIAGNOSIS — N3000 Acute cystitis without hematuria: Secondary | ICD-10-CM | POA: Diagnosis not present

## 2022-10-22 DIAGNOSIS — J90 Pleural effusion, not elsewhere classified: Secondary | ICD-10-CM | POA: Diagnosis not present

## 2022-10-22 DIAGNOSIS — N183 Chronic kidney disease, stage 3 unspecified: Secondary | ICD-10-CM

## 2022-10-22 DIAGNOSIS — Z66 Do not resuscitate: Secondary | ICD-10-CM | POA: Diagnosis present

## 2022-10-22 DIAGNOSIS — I130 Hypertensive heart and chronic kidney disease with heart failure and stage 1 through stage 4 chronic kidney disease, or unspecified chronic kidney disease: Secondary | ICD-10-CM | POA: Diagnosis present

## 2022-10-22 DIAGNOSIS — I5033 Acute on chronic diastolic (congestive) heart failure: Secondary | ICD-10-CM

## 2022-10-22 DIAGNOSIS — Z955 Presence of coronary angioplasty implant and graft: Secondary | ICD-10-CM | POA: Diagnosis not present

## 2022-10-22 DIAGNOSIS — I2510 Atherosclerotic heart disease of native coronary artery without angina pectoris: Secondary | ICD-10-CM | POA: Diagnosis present

## 2022-10-22 DIAGNOSIS — T68XXXA Hypothermia, initial encounter: Secondary | ICD-10-CM | POA: Diagnosis not present

## 2022-10-22 DIAGNOSIS — E860 Dehydration: Secondary | ICD-10-CM | POA: Diagnosis present

## 2022-10-22 DIAGNOSIS — Z8249 Family history of ischemic heart disease and other diseases of the circulatory system: Secondary | ICD-10-CM | POA: Diagnosis not present

## 2022-10-22 DIAGNOSIS — I2721 Secondary pulmonary arterial hypertension: Secondary | ICD-10-CM | POA: Diagnosis present

## 2022-10-22 DIAGNOSIS — T68XXXD Hypothermia, subsequent encounter: Secondary | ICD-10-CM | POA: Diagnosis not present

## 2022-10-22 DIAGNOSIS — E8809 Other disorders of plasma-protein metabolism, not elsewhere classified: Secondary | ICD-10-CM | POA: Diagnosis present

## 2022-10-22 DIAGNOSIS — Z96652 Presence of left artificial knee joint: Secondary | ICD-10-CM | POA: Diagnosis present

## 2022-10-22 DIAGNOSIS — M25561 Pain in right knee: Secondary | ICD-10-CM | POA: Diagnosis not present

## 2022-10-22 DIAGNOSIS — K219 Gastro-esophageal reflux disease without esophagitis: Secondary | ICD-10-CM

## 2022-10-22 DIAGNOSIS — I69959 Hemiplegia and hemiparesis following unspecified cerebrovascular disease affecting unspecified side: Secondary | ICD-10-CM | POA: Diagnosis not present

## 2022-10-22 DIAGNOSIS — J9601 Acute respiratory failure with hypoxia: Secondary | ICD-10-CM | POA: Diagnosis present

## 2022-10-22 DIAGNOSIS — J9811 Atelectasis: Secondary | ICD-10-CM | POA: Diagnosis not present

## 2022-10-22 DIAGNOSIS — J439 Emphysema, unspecified: Secondary | ICD-10-CM | POA: Diagnosis not present

## 2022-10-22 DIAGNOSIS — D509 Iron deficiency anemia, unspecified: Secondary | ICD-10-CM | POA: Diagnosis present

## 2022-10-22 DIAGNOSIS — I252 Old myocardial infarction: Secondary | ICD-10-CM | POA: Diagnosis not present

## 2022-10-22 DIAGNOSIS — I10 Essential (primary) hypertension: Secondary | ICD-10-CM

## 2022-10-22 DIAGNOSIS — Z1152 Encounter for screening for COVID-19: Secondary | ICD-10-CM | POA: Diagnosis not present

## 2022-10-22 DIAGNOSIS — E785 Hyperlipidemia, unspecified: Secondary | ICD-10-CM | POA: Diagnosis present

## 2022-10-22 LAB — ECHOCARDIOGRAM COMPLETE
AR max vel: 2.34 cm2
AV Area VTI: 2.46 cm2
AV Area mean vel: 2.36 cm2
AV Mean grad: 11 mmHg
AV Peak grad: 19.7 mmHg
Ao pk vel: 2.22 m/s
Area-P 1/2: 3.42 cm2
Height: 60 in
MV M vel: 5.15 m/s
MV Peak grad: 106.1 mmHg
MV VTI: 2 cm2
Radius: 0.6 cm
S' Lateral: 2 cm
Weight: 2271.62 oz

## 2022-10-22 LAB — COMPREHENSIVE METABOLIC PANEL
ALT: 25 U/L (ref 0–44)
AST: 27 U/L (ref 15–41)
Albumin: 2.6 g/dL — ABNORMAL LOW (ref 3.5–5.0)
Alkaline Phosphatase: 91 U/L (ref 38–126)
Anion gap: 11 (ref 5–15)
BUN: 55 mg/dL — ABNORMAL HIGH (ref 8–23)
CO2: 23 mmol/L (ref 22–32)
Calcium: 9.5 mg/dL (ref 8.9–10.3)
Chloride: 108 mmol/L (ref 98–111)
Creatinine, Ser: 1.51 mg/dL — ABNORMAL HIGH (ref 0.44–1.00)
GFR, Estimated: 31 mL/min — ABNORMAL LOW (ref >60)
Glucose, Bld: 64 mg/dL — ABNORMAL LOW (ref 70–99)
Potassium: 5.8 mmol/L — ABNORMAL HIGH (ref 3.5–5.1)
Sodium: 142 mmol/L (ref 135–145)
Total Bilirubin: 0.7 mg/dL (ref 0.3–1.2)
Total Protein: 5.5 g/dL — ABNORMAL LOW (ref 6.5–8.1)

## 2022-10-22 LAB — CBC
HCT: 30.2 % — ABNORMAL LOW (ref 36.0–46.0)
Hemoglobin: 9.1 g/dL — ABNORMAL LOW (ref 12.0–15.0)
MCH: 26.9 pg (ref 26.0–34.0)
MCHC: 30.1 g/dL (ref 30.0–36.0)
MCV: 89.3 fL (ref 80.0–100.0)
Platelets: 177 10*3/uL (ref 150–400)
RBC: 3.38 MIL/uL — ABNORMAL LOW (ref 3.87–5.11)
RDW: 16.4 % — ABNORMAL HIGH (ref 11.5–15.5)
WBC: 9.6 10*3/uL (ref 4.0–10.5)
nRBC: 1.4 % — ABNORMAL HIGH (ref 0.0–0.2)

## 2022-10-22 LAB — GLUCOSE, CAPILLARY: Glucose-Capillary: 113 mg/dL — ABNORMAL HIGH (ref 70–99)

## 2022-10-22 MED ORDER — FUROSEMIDE 10 MG/ML IJ SOLN
20.0000 mg | Freq: Once | INTRAMUSCULAR | Status: AC
  Administered 2022-10-22: 20 mg via INTRAVENOUS
  Filled 2022-10-22: qty 2

## 2022-10-22 MED ORDER — SODIUM ZIRCONIUM CYCLOSILICATE 5 G PO PACK
5.0000 g | PACK | Freq: Once | ORAL | Status: AC
  Administered 2022-10-22: 5 g via ORAL
  Filled 2022-10-22: qty 1

## 2022-10-22 NOTE — TOC Progression Note (Signed)
Transition of Care [facility]) - Progression Note    Patient Details  Name: [name] MRN: [id number] Date of Birth: 02/13/1923  Transition of Care [facility]) CM/SW Contact  Rodney Booze, [place] Phone Number: 10/22/2022, 4:20 PM  Clinical Narrative:    TOC will continue to follow at this time after chart review the patient is not medically cleared.        Expected Discharge Plan and Services                                               Social Determinants of Health (SDOH) Interventions SDOH Screenings   Food Insecurity: No Food Insecurity (10/21/2022)  Housing: Low Risk  (10/21/2022)  Transportation Needs: No Transportation Needs (10/21/2022)  Utilities: Not At Risk (10/21/2022)  Tobacco Use: Low Risk  (10/20/2022)    Readmission Risk Interventions    01/17/2022    3:24 PM  Readmission Risk Prevention Plan  Transportation Screening Complete  PCP or Specialist Appt within 3-5 Days Complete  Palliative Care Screening Not Applicable

## 2022-10-22 NOTE — Progress Notes (Signed)
  Echocardiogram 2D Echocardiogram has been performed.  [name] 10/22/2022, 8:29 AM

## 2022-10-22 NOTE — Progress Notes (Signed)
PROGRESS NOTE    [name]  GLO:[id number] DOB: [birth year] DOA: 10/20/2022 PCP: [name], DO   Brief Narrative:  HPI per Dr. Tennis Must [name] is a 89 y.o. 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   No chest pain, palpitations, diaphoresis, PND or orthopnea No fever, chills or night sweats. No sore throat, rhinorrhea, dyspnea, wheezing or hemoptysis  No appetite changes, abdominal pain, diarrhea, constipation, melena or hematochezia.  No flank pain, dysuria, frequency or hematuria.  No polyuria, polydipsia, polyphagia or blurred vision.     The patient was hypothermic on arrival with a temperature of 91.1 F.  She was transferred to the stepdown unit.  Urine analysis showed pyuria, but no bacteriuria.  Given hypothermia and history of ESBL UTI, urine culture and sensitivity order followed by meropenem 500 mg twice a day IVPB.   ED course: Initial vital signs were temperature 97.6 F, pulse 54, respirations 16, BP 131/95 mmHg O2 sat 100% on room air.  The patient is now on nasal cannula oxygen at 2 LPM most recent O2 sat is 100%.  She received furosemide 60 mg IVP and her home meds were ordered in the ED including metoprolol.   Lab work: Coronavirus, RSV and influenza PCR was negative.  CBC is her white count 9.2, hemoglobin 9.4 g/dL platelets 255.  Troponin x 2 normal.  BNP 580.4 pg/mL.  CMP showed a potassium of 5.4 mmol/L, glucose 111, BUN 63 and creatinine 1.51 mg/dL.  The rest of the CMP measurements were normal.  Baseline creatinine in the last 2 years usually ranges between 1.2 and 1.6 mg/deciliter.   Imaging: Portable 1 view  chest radiograph with a new right hilar/perihilar masslike density measuring 4.0 x 4.2 cm concerning for malignancy.  There is a new small right pleural effusion.  CT chest with contrast with marked enlargement of the main pulmonary artery and right pulmonary artery compatible with pulmonary hypertension.  No hilar mass identified.  Small bilateral pleural effusions with bibasilar compressive atelectasis.  Central peribronchial wall thickening that may be infectious or inflammatory.  Cardiomegaly.  1 cm incidental left thyroid nodule with no follow-up recommended.  **Interim History Patient has continued to be diuresed given her volume overload.  She requires oxygen and will need to be weaned.  PT OT to evaluate and treat.  Assessment and Plan:  Acute on Chronic Diastolic CHF (congestive heart failure) (HCC) -Observation/SDU will change to inpatient telemetry -Supplemental oxygen as needed. -Sodium and fluid restriction. Furosemide 20 mg IVP x 1 given once yesterday and will continue twice daily dosing -SpO2: 100 % O2 Flow Rate (L/min): 2 L/min -Monitor daily weights, intake and output.;  -Patient is -3.639 Liters  -Monitor renal function electrolytes. -Check Echocardiogram and showed "Small mid cavitary gradient . Left ventricular ejection fraction, by estimation, is 60 to 65%. The left ventricle has normal function. The left ventricle has no regional wall motion abnormalities. There is moderate left ventricular hypertrophy. Left ventricular diastolic parameters are consistent with Grade II diastolic dysfunction (pseudonormalization). Elevated left ventricular end-diastolic pressure." -Hold beta-blocker due to bradycardia/CHF exacerbation. -Continue to monitor for signs and symptoms of volume overload and repeat chest x-ray in a.m. -PT and OT to  further evaluate and treat   Hypothermia -Warming measures. -TSH normal. -CXR with cardiomegaly and edema. -Urinalysis with pyuria. -Urine culture  and sensitivity ordered. -Transferred to stepdown. -History of ESBL infection so meropenem started and family confirms that she was testing positive for a UTI  Suspected ESBL UTI -Urinalysis showed clear appearance with moderate leukocytes, negative nitrites, no bacteria seen, greater than 50 WBCs but urine culture showing greater than 100,000 colony-forming units of gram-negative rods -Continuing IV meropenem   Primary pulmonary hypertension ([place]) -Check echocardiogram and showed "There is severely elevated pulmonary artery systolic pressure. The tricuspid regurgitant velocity is  3.43 m/s, and with an assumed right atrial pressure of 15 mmHg, the estimated right ventricular systolic pressure is 54.6 mmHg " -CT Scan of the Chest w/ Contrast done and showed "Marked enlargement of the main pulmonary artery and right pulmonary artery compatible with pulmonary arterial hypertension. No hilar mass identified. Small bilateral pleural effusions with bibasilar compressive atelectasis. Central peribronchial wall thickening may be infectious/inflammatory. Cardiomegaly. 1 cm incidental left thyroid nodule. No follow-up imaging is recommended." -Continue IV diuresis   Essential Hypertension -Continue amlodipine 2.5 mg p.o. daily. -Hold metoprolol due to bradycardia acute CHF. -Continue furosemide as above.   CKD (chronic kidney disease), stage IIIb (HCC) -BUN/Cr Trend: Recent Labs  Lab 10/20/22 1658 10/21/22 1455 10/22/22 0301  BUN 63* 61* 55*  CREATININE 1.51* 1.49* 1.51*  -Avoid Nephrotoxic Medications, Contrast Dyes, Hypotension and Dehydration to Ensure Adequate Renal Perfusion and will need to Renally Adjust Meds -Continue to Monitor and Trend Renal Function carefully and repeat CMP in the AM   Hyperlipidemia -Continue Atorvastatin 80 mg p.o. daily.   Gastroesophageal reflux disease -Continue famotidine 20 mg p.o. daily.   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 -PT/OT to Evaluate and Treat   Iron Deficiency Anemia -Hgb/Hct Trend: Recent Labs  Lab 10/20/22 1658 10/21/22 1455 10/22/22 0301  HGB 9.4* 9.1* 9.1*  HCT 32.2* 31.4* 30.2*  MCV 91.0 92.6 89.3  -Check Anemia Panel in the Am -Continue to Monitor for S/Sx of Bleeding; No overt bleeding noted -Repeat CBC in the AM    Glaucoma -Continue current eyedrops, except for timolol.   Hyperkalemia -K+ Trend: Recent Labs  Lab 10/20/22 1658 10/21/22 1455 10/22/22 0301  K 5.4* 5.2* 5.8*  -C/w Lasix and Given Lokelma  -Continue to Monitor and Trend  Hypoalbuminemia -Patient's Albumin Level is now 2.6 -Continue to Monitor and Trend and repeat CMP in the AM   DVT prophylaxis: SCDs Start: 10/21/22 1423    Code Status: DNR Family Communication: Discussed with Daughter at bedside   Disposition Plan:  Level of care: Stepdown Status is: Observation The patient will require care spanning > 2 midnights and should be moved to inpatient because: Will Continue Diuresis    Consultants:  None  Procedures:  ECHOCARDIOGRAM IMPRESSIONS     1. Small mid cavitary gradient . Left ventricular ejection fraction, by  estimation, is 60 to 65%. The left ventricle has normal function. The left  ventricle has no regional wall motion abnormalities. There is moderate  left ventricular hypertrophy. Left  ventricular diastolic parameters are consistent with Grade II diastolic  dysfunction (pseudonormalization). Elevated left ventricular end-diastolic  pressure.   2. Right ventricular systolic function is normal. The right ventricular  size is normal. There is severely elevated pulmonary artery systolic  pressure.   3. Mean gradient across valve in diastole 12 mmHg but MVA normal by PT1/2  secondary to MAC. The mitral valve is normal in structure. Mild mitral  valve regurgitation. No evidence of mitral stenosis. Moderate mitral  annular  calcification.   4. Tricuspid valve regurgitation is moderate.   5. The aortic valve is tricuspid. There is moderate calcification of the  aortic valve. There is moderate thickening of the aortic valve. Aortic  valve regurgitation is not visualized. Aortic valve  sclerosis/calcification is present, without any evidence  of aortic stenosis.   6. The inferior vena cava is normal in size with greater than 50%  respiratory variability, suggesting right atrial pressure of 3 mmHg.   FINDINGS   Left Ventricle: Small mid cavitary gradient. Left ventricular ejection  fraction, by estimation, is 60 to 65%. The left ventricle has normal  function. The left ventricle has no regional wall motion abnormalities.  The left ventricular internal cavity size   was normal in size. There is moderate left ventricular hypertrophy. Left  ventricular diastolic parameters are consistent with Grade II diastolic  dysfunction (pseudonormalization). Elevated left ventricular end-diastolic  pressure.   Right Ventricle: The right ventricular size is normal. No increase in  right ventricular wall thickness. Right ventricular systolic function is  normal. There is severely elevated pulmonary artery systolic pressure. The  tricuspid regurgitant velocity is  3.43 m/s, and with an assumed right atrial pressure of 15 mmHg, the  estimated right ventricular systolic pressure is 27.2 mmHg.   Left Atrium: Left atrial size was normal in size.   Right Atrium: Right atrial size was normal in size.   Pericardium: There is no evidence of pericardial effusion.   Mitral Valve: Mean gradient across valve in diastole 12 mmHg but MVA  normal by PT1/2 secondary to MAC. The mitral valve is normal in structure.  There is moderate thickening of the mitral valve leaflet(s). Moderate  mitral annular calcification. Mild  mitral valve regurgitation. No evidence of mitral valve stenosis. MV peak  gradient, 23.8 mmHg. The mean mitral valve  gradient is 12.0 mmHg.   Tricuspid Valve: The tricuspid valve is normal in structure. Tricuspid  valve regurgitation is moderate . No evidence of tricuspid stenosis.   Aortic Valve: The aortic valve is tricuspid. There is moderate  calcification of the aortic valve. There is moderate thickening of the  aortic valve. Aortic valve regurgitation is not visualized. Aortic valve  sclerosis/calcification is present, without any   evidence of aortic stenosis. Aortic valve mean gradient measures 11.0  mmHg. Aortic valve peak gradient measures 19.7 mmHg. Aortic valve area, by  VTI measures 2.46 cm.   Pulmonic Valve: The pulmonic valve was normal in structure. Pulmonic valve  regurgitation is not visualized. No evidence of pulmonic stenosis.   Aorta: The aortic root is normal in size and structure.   Venous: The inferior vena cava is normal in size with greater than 50%  respiratory variability, suggesting right atrial pressure of 3 mmHg.   IAS/Shunts: No atrial level shunt detected by color flow Doppler.     LEFT VENTRICLE  PLAX 2D  LVIDd:         3.30 cm   Diastology  LVIDs:         2.00 cm   LV e' medial:    4.57 cm/s  LV PW:         1.40 cm   LV E/e' medial:  35.2  LV IVS:        1.50 cm   LV e' lateral:   4.68 cm/s  LVOT diam:  2.10 cm   LV E/e' lateral: 34.4  LV SV:         116  LV SV Index:   72  LVOT Area:     3.46 cm     RIGHT VENTRICLE             IVC  RV S prime:     10.20 cm/s  IVC diam: 2.05 cm  TAPSE (M-mode): 2.0 cm   LEFT ATRIUM             Index        RIGHT ATRIUM           Index  LA diam:        2.90 cm 1.80 cm/m   RA Area:     13.10 cm  LA Vol (A2C):   61.1 ml 37.86 ml/m  RA Volume:   29.50 ml  18.28 ml/m  LA Vol (A4C):   35.0 ml 21.69 ml/m  LA Biplane Vol: 49.9 ml 30.92 ml/m   AORTIC VALVE  AV Area (Vmax):    2.34 cm  AV Area (Vmean):   2.36 cm  AV Area (VTI):     2.46 cm  AV Vmax:           222.00 cm/s  AV Vmean:          152.667 cm/s  AV  VTI:            0.470 m  AV Peak Grad:      19.7 mmHg  AV Mean Grad:      11.0 mmHg  LVOT Vmax:         150.00 cm/s  LVOT Vmean:        104.000 cm/s  LVOT VTI:          0.334 m  LVOT/AV VTI ratio: 0.71    AORTA  Ao Root diam: 3.30 cm  Ao Asc diam:  3.30 cm   MITRAL VALVE                  TRICUSPID VALVE  MV Area (PHT): 3.42 cm       TR Peak grad:   47.1 mmHg  MV Area VTI:   2.00 cm       TR Vmax:        343.00 cm/s  MV Peak grad:  23.8 mmHg  MV Mean grad:  12.0 mmHg      SHUNTS  MV Vmax:       2.44 m/s       Systemic VTI:  0.33 m  MV Vmean:      170.0 cm/s     Systemic Diam: 2.10 cm  MV Decel Time: 222 msec  MR Peak grad:    106.1 mmHg  MR Mean grad:    63.0 mmHg  MR Vmax:         515.00 cm/s  MR Vmean:        381.0 cm/s  MR PISA:         2.26 cm  MR PISA Eff ROA: 17 mm  MR PISA Radius:  0.60 cm  MV E velocity: 161.00 cm/s  MV A velocity: 180.00 cm/s  MV E/A ratio:  0.89   Antimicrobials:  Anti-infectives (From admission, onward)    Start     Dose/Rate Route Frequency Ordered Stop   10/21/22 2000  cefTRIAXone (ROCEPHIN) 1 g in sodium chloride 0.9 % 100 mL IVPB  Status:  Discontinued  1 g 200 mL/hr over 30 Minutes Intravenous Every 24 hours 10/21/22 1859 10/21/22 1906   10/21/22 1915  meropenem (MERREM) 1,000 mg in sodium chloride 0.9 % 100 mL IVPB       Note to Pharmacy: Adjust dose as needed.  History of ESBL UTIs presenting with hypothermia and pyuria on urine analysis.   1,000 mg 200 mL/hr over 30 Minutes Intravenous Every 12 hours 10/21/22 1906         Subjective: Seen and examined at bedside and she is still "puffy".  Denied chest pain or shortness of breath but still wearing supplemental oxygen.  Thinks she is doing little bit better.  States that she is hungry.  No other concerns or complaints at this time.  Objective: Vitals:   10/22/22 0400 10/22/22 0428 10/22/22 0430 10/22/22 0755  BP: (!) 158/34     Pulse: 83     Resp: 13     Temp:  98.5 F  (36.9 C)  (!) 97.5 F (36.4 C)  TempSrc:  Oral  Axillary  SpO2: 98%     Weight:   64.4 kg   Height:        Intake/Output Summary (Last 24 hours) at 10/22/2022 0914 Last data filed at 10/22/2022 0541 Gross per 24 hour  Intake 304.82 ml  Output 1000 ml  Net -695.18 ml   Filed Weights   10/21/22 1413 10/21/22 1850 10/22/22 0430  Weight: 64.4 kg 64.2 kg 64.4 kg   Examination: Physical Exam:  Constitutional: WN/WD overweight elderly Caucasian female currently no acute distress Respiratory: Diminished to auscultation bilaterally with coarse breath sounds and has some slight crackles., no wheezing, rales, rhonchi. Normal respiratory effort and patient is not tachypenic. No accessory muscle use.  Unlabored breathing but wearing supplemental oxygen via nasal cannula Cardiovascular: RRR, no murmurs / rubs / gallops. S1 and S2 auscultated.  Has 1+ lower extremity edema Abdomen: Soft, non-tender, slightly distended secondary body habitus. Bowel sounds positive.  GU: Deferred. Musculoskeletal: No clubbing / cyanosis of digits/nails. No joint deformity upper and lower extremities.  Skin: No rashes, lesions, ulcers on a limited skin evaluation. No induration; Warm and dry.  Neurologic: CN 2-12 grossly intact with no focal deficits. Romberg sign cerebellar reflexes not assessed.  Psychiatric: Normal judgment and insight. Alert and oriented x 2. Normal mood and appropriate affect.   Data Reviewed: I have personally reviewed following labs and imaging studies  CBC: Recent Labs  Lab 10/20/22 1658 10/21/22 1455 10/22/22 0301  WBC 9.2 6.5 9.6  NEUTROABS 7.0  --   --   HGB 9.4* 9.1* 9.1*  HCT 32.2* 31.4* 30.2*  MCV 91.0 92.6 89.3  PLT 255 212 734   Basic Metabolic Panel: Recent Labs  Lab 10/20/22 1658 10/21/22 1455 10/22/22 0301  NA 140 140 142  K 5.4* 5.2* 5.8*  CL 110 108 108  CO2 _0 GLUCOSE 111* 94 64*  BUN 63* 61* 55*  CREATININE 1.51* 1.49* 1.51*  CALCIUM 9.4 9.3 9.5   MG  --  2.1  --   PHOS  --  5.7*  --    GFR: Estimated Creatinine Clearance: 17.9 mL/min (A) (by C-G formula based on SCr of 1.51 mg/dL (H)). Liver Function Tests: Recent Labs  Lab 10/20/22 1658 10/22/22 0301  AST 26 27  ALT 23 25  ALKPHOS 110 91  BILITOT 0.3 0.7  PROT 6.5 5.5*  ALBUMIN 3.5 2.6*   No results for input(s): "LIPASE", "AMYLASE" in the last  168 hours. No results for input(s): "AMMONIA" in the last 168 hours. Coagulation Profile: No results for input(s): "INR", "PROTIME" in the last 168 hours. Cardiac Enzymes: No results for input(s): "CKTOTAL", "CKMB", "CKMBINDEX", "TROPONINI" in the last 168 hours. BNP (last 3 results) No results for input(s): "PROBNP" in the last 8760 hours. HbA1C: No results for input(s): "HGBA1C" in the last 72 hours. CBG: Recent Labs  Lab 10/22/22 0616  GLUCAP 113*   Lipid Profile: No results for input(s): "CHOL", "HDL", "LDLCALC", "TRIG", "CHOLHDL", "LDLDIRECT" in the last 72 hours. Thyroid Function Tests: Recent Labs    10/21/22 1455  TSH 3.088   Anemia Panel: No results for input(s): "VITAMINB12", "FOLATE", "FERRITIN", "TIBC", "IRON", "RETICCTPCT" in the last 72 hours. Sepsis Labs: No results for input(s): "PROCALCITON", "LATICACIDVEN" in the last 168 hours.  Recent Results (from the past 240 hour(s))  Resp panel by RT-PCR (RSV, Flu A&B, Covid) Anterior Nasal Swab     Status: None   Collection Time: 10/20/22  6:55 PM   Specimen: Anterior Nasal Swab  Result Value Ref Range Status   SARS Coronavirus 2 by RT PCR NEGATIVE NEGATIVE Final    Comment: (NOTE) SARS-CoV-2 target nucleic acids are NOT DETECTED.  The SARS-CoV-2 RNA is generally detectable in upper respiratory specimens during the acute phase of infection. The lowest concentration of SARS-CoV-2 viral copies this assay can detect is 138 copies/mL. A negative result does not preclude SARS-Cov-2 infection and should not be used as the sole basis for treatment  or other patient management decisions. A negative result may occur with  improper specimen collection/handling, submission of specimen other than nasopharyngeal swab, presence of viral mutation(s) within the areas targeted by this assay, and inadequate number of viral copies(<138 copies/mL). A negative result must be combined with clinical observations, patient history, and epidemiological information. The expected result is Negative.  Fact Sheet for Patients:  EntrepreneurPulse.com.au  Fact Sheet for Healthcare Providers:  IncredibleEmployment.be  This test is no t yet approved or cleared by the [place] FDA and  has been authorized for detection and/or diagnosis of SARS-CoV-2 by FDA under an Emergency Use Authorization (EUA). This EUA will remain  in effect (meaning this test can be used) for the duration of the COVID-19 declaration under Section 564(b)(1) of the Act, 21 U.S.C.section 360bbb-3(b)(1), unless the authorization is terminated  or revoked sooner.       Influenza A by PCR NEGATIVE NEGATIVE Final   Influenza B by PCR NEGATIVE NEGATIVE Final    Comment: (NOTE) The Xpert Xpress SARS-CoV-2/FLU/RSV plus assay is intended as an aid in the diagnosis of influenza from Nasopharyngeal swab specimens and should not be used as a sole basis for treatment. Nasal washings and aspirates are unacceptable for Xpert Xpress SARS-CoV-2/FLU/RSV testing.  Fact Sheet for Patients: EntrepreneurPulse.com.au  Fact Sheet for Healthcare Providers: IncredibleEmployment.be  This test is not yet approved or cleared by the [place] FDA and has been authorized for detection and/or diagnosis of SARS-CoV-2 by FDA under an Emergency Use Authorization (EUA). This EUA will remain in effect (meaning this test can be used) for the duration of the COVID-19 declaration under Section 564(b)(1) of the Act, 21 U.S.C. section  360bbb-3(b)(1), unless the authorization is terminated or revoked.     Resp Syncytial Virus by PCR NEGATIVE NEGATIVE Final    Comment: (NOTE) Fact Sheet for Patients: EntrepreneurPulse.com.au  Fact Sheet for Healthcare Providers: IncredibleEmployment.be  This test is not yet approved or cleared by the [place] and  has been authorized for detection and/or diagnosis of SARS-CoV-2 by FDA under an Emergency Use Authorization (EUA). This EUA will remain in effect (meaning this test can be used) for the duration of the COVID-19 declaration under Section 564(b)(1) of the Act, 21 U.S.C. section 360bbb-3(b)(1), unless the authorization is terminated or revoked.  Performed at [facility], [address], [place], [place] [address]   MRSA Next Gen by PCR, Nasal     Status: None   Collection Time: 10/21/22  6:49 PM   Specimen: Nasal Mucosa; Nasal Swab  Result Value Ref Range Status   MRSA by PCR Next Gen NOT DETECTED NOT DETECTED Final    Comment: (NOTE) The GeneXpert MRSA Assay (FDA approved for NASAL specimens only), is one component of a comprehensive MRSA colonization surveillance program. It is not intended to diagnose MRSA infection nor to guide or monitor treatment for MRSA infections. Test performance is not FDA approved in patients less than 32 years old. Performed at [facility], [place] [address], [place], [place] [address]      Radiology Studies: ECHOCARDIOGRAM COMPLETE  Result Date: 10/22/2022    ECHOCARDIOGRAM REPORT   Patient Name:   [name] Date of Exam: 10/22/2022 Medical Rec #:  [id number]     Height:       60.0 in Accession #:    [id number]    Weight:       142.0 lb Date of Birth:  1923-09-02      BSA:          1.614 m Patient Age:    71 years      BP:           168/59 mmHg Patient Gender: F             HR:           77 bpm. Exam Location:  Inpatient Procedure: 2D Echo, Cardiac  Doppler and Color Doppler Indications:    R06.02 SOB; I50.40* Unspecified combined systolic (congestive)                 and diastolic (congestive) heart failure  History:        Patient has prior history of Echocardiogram examinations, most                 recent 08/19/2015. CHF and Cardiomegaly, Previous Myocardial                 Infarction, Stroke and TIA, Signs/Symptoms:Edema; Risk                 Factors:Hypertension and Dyslipidemia.  Sonographer:    Roseanna Rainbow RDCS Referring Phys: [id number] [place]  1. Small mid cavitary gradient . Left ventricular ejection fraction, by estimation, is 60 to 65%. The left ventricle has normal function. The left ventricle has no regional wall motion abnormalities. There is moderate left ventricular hypertrophy. Left ventricular diastolic parameters are consistent with Grade II diastolic dysfunction (pseudonormalization). Elevated left ventricular end-diastolic pressure.  2. Right ventricular systolic function is normal. The right ventricular size is normal. There is severely elevated pulmonary artery systolic pressure.  3. Mean gradient across valve in diastole 12 mmHg but MVA normal by PT1/2 secondary to MAC. The mitral valve is normal in structure. Mild mitral valve regurgitation. No evidence of mitral stenosis. Moderate mitral annular calcification.  4. Tricuspid valve regurgitation is moderate.  5. The aortic valve is tricuspid. There is moderate calcification of the aortic  valve. There is moderate thickening of the aortic valve. Aortic valve regurgitation is not visualized. Aortic valve sclerosis/calcification is present, without any evidence of aortic stenosis.  6. The inferior vena cava is normal in size with greater than 50% respiratory variability, suggesting right atrial pressure of 3 mmHg. FINDINGS  Left Ventricle: Small mid cavitary gradient. Left ventricular ejection fraction, by estimation, is 60 to 65%. The left ventricle has normal function.  The left ventricle has no regional wall motion abnormalities. The left ventricular internal cavity size  was normal in size. There is moderate left ventricular hypertrophy. Left ventricular diastolic parameters are consistent with Grade II diastolic dysfunction (pseudonormalization). Elevated left ventricular end-diastolic pressure. Right Ventricle: The right ventricular size is normal. No increase in right ventricular wall thickness. Right ventricular systolic function is normal. There is severely elevated pulmonary artery systolic pressure. The tricuspid regurgitant velocity is 3.43 m/s, and with an assumed right atrial pressure of 15 mmHg, the estimated right ventricular systolic pressure is 03.4 mmHg. Left Atrium: Left atrial size was normal in size. Right Atrium: Right atrial size was normal in size. Pericardium: There is no evidence of pericardial effusion. Mitral Valve: Mean gradient across valve in diastole 12 mmHg but MVA normal by PT1/2 secondary to MAC. The mitral valve is normal in structure. There is moderate thickening of the mitral valve leaflet(s). Moderate mitral annular calcification. Mild mitral valve regurgitation. No evidence of mitral valve stenosis. MV peak gradient, 23.8 mmHg. The mean mitral valve gradient is 12.0 mmHg. Tricuspid Valve: The tricuspid valve is normal in structure. Tricuspid valve regurgitation is moderate . No evidence of tricuspid stenosis. Aortic Valve: The aortic valve is tricuspid. There is moderate calcification of the aortic valve. There is moderate thickening of the aortic valve. Aortic valve regurgitation is not visualized. Aortic valve sclerosis/calcification is present, without any  evidence of aortic stenosis. Aortic valve mean gradient measures 11.0 mmHg. Aortic valve peak gradient measures 19.7 mmHg. Aortic valve area, by VTI measures 2.46 cm. Pulmonic Valve: The pulmonic valve was normal in structure. Pulmonic valve regurgitation is not visualized. No evidence  of pulmonic stenosis. Aorta: The aortic root is normal in size and structure. Venous: The inferior vena cava is normal in size with greater than 50% respiratory variability, suggesting right atrial pressure of 3 mmHg. IAS/Shunts: No atrial level shunt detected by color flow Doppler.  LEFT VENTRICLE PLAX 2D LVIDd:         3.30 cm   Diastology LVIDs:         2.00 cm   LV e' medial:    4.57 cm/s LV PW:         1.40 cm   LV E/e' medial:  35.2 LV IVS:        1.50 cm   LV e' lateral:   4.68 cm/s LVOT diam:     2.10 cm   LV E/e' lateral: 34.4 LV SV:         116 LV SV Index:   72 LVOT Area:     3.46 cm  RIGHT VENTRICLE             IVC RV S prime:     10.20 cm/s  IVC diam: 2.05 cm TAPSE (M-mode): 2.0 cm LEFT ATRIUM             Index        RIGHT ATRIUM           Index LA diam:  2.90 cm 1.80 cm/m   RA Area:     13.10 cm LA Vol (A2C):   61.1 ml 37.86 ml/m  RA Volume:   29.50 ml  18.28 ml/m LA Vol (A4C):   35.0 ml 21.69 ml/m LA Biplane Vol: 49.9 ml 30.92 ml/m  AORTIC VALVE AV Area (Vmax):    2.34 cm AV Area (Vmean):   2.36 cm AV Area (VTI):     2.46 cm AV Vmax:           222.00 cm/s AV Vmean:          152.667 cm/s AV VTI:            0.470 m AV Peak Grad:      19.7 mmHg AV Mean Grad:      11.0 mmHg LVOT Vmax:         150.00 cm/s LVOT Vmean:        104.000 cm/s LVOT VTI:          0.334 m LVOT/AV VTI ratio: 0.71  AORTA Ao Root diam: 3.30 cm Ao Asc diam:  3.30 cm MITRAL VALVE                  TRICUSPID VALVE MV Area (PHT): 3.42 cm       TR Peak grad:   47.1 mmHg MV Area VTI:   2.00 cm       TR Vmax:        343.00 cm/s MV Peak grad:  23.8 mmHg MV Mean grad:  12.0 mmHg      SHUNTS MV Vmax:       2.44 m/s       Systemic VTI:  0.33 m MV Vmean:      170.0 cm/s     Systemic Diam: 2.10 cm MV Decel Time: 222 msec MR Peak grad:    106.1 mmHg MR Mean grad:    63.0 mmHg MR Vmax:         515.00 cm/s MR Vmean:        381.0 cm/s MR PISA:         2.26 cm MR PISA Eff ROA: 17 mm MR PISA Radius:  0.60 cm MV E velocity: 161.00  cm/s MV A velocity: 180.00 cm/s MV E/A ratio:  0.89 Jenkins Rouge MD Electronically signed by Jenkins Rouge MD Signature Date/Time: 10/22/2022/9:10:47 AM    Final    DG CHEST PORT 1 VIEW  Result Date: 10/21/2022 CLINICAL DATA:  Acute CHF EXAM: PORTABLE CHEST 1 VIEW COMPARISON:  10/20/2022 chest x-ray and chest CT, radiograph 07/24/2017 FINDINGS: Cardiomegaly with central congestion and small pleural effusions, not significantly changed. Aortic atherosclerosis. No pneumothorax IMPRESSION: Cardiomegaly with central congestion and small pleural effusions. Electronically Signed   By: Donavan Foil M.D.   On: 10/21/2022 18:22   CT Chest W Contrast  Result Date: 10/20/2022 CLINICAL DATA:  Abnormal lung nodule. EXAM: CT CHEST WITH CONTRAST TECHNIQUE: Multidetector CT imaging of the chest was performed during intravenous contrast administration. RADIATION DOSE REDUCTION: This exam was performed according to the departmental dose-optimization program which includes automated exposure control, adjustment of the mA and/or kV according to patient size and/or use of iterative reconstruction technique. CONTRAST:  27m OMNIPAQUE IOHEXOL 300 MG/ML  SOLN COMPARISON:  Chest x-ray 10/20/2022 FINDINGS: Cardiovascular: Aorta is ectatic without focal dilatation. The heart is enlarged. There is no pericardial effusion. There are atherosclerotic calcifications of the aorta and coronary arteries. The main pulmonary artery and right pulmonary artery are  markedly enlarged likely accounting for masslike density seen on chest x-ray. Mediastinum/Nodes: There is nodularity of the thyroid gland with largest nodule measuring 1 cm in the left lobe. There are no enlarged mediastinal, hilar or axillary lymph nodes. Esophagus is nondilated. There is a small hiatal hernia. Lungs/Pleura: Small pleural effusions are present. There is compressive atelectasis in the bilateral lower lobes. There is some central peribronchial wall thickening. There is no  pneumothorax. Upper Abdomen: Left renal cyst is partially imaged measuring up to 6.3 cm. Musculoskeletal: No acute fractures are identified. Right shoulder joint effusion is present. There is stable chronic compression deformity of L1. IMPRESSION: 1. Marked enlargement of the main pulmonary artery and right pulmonary artery compatible with pulmonary arterial hypertension. No hilar mass identified. 2. Small bilateral pleural effusions with bibasilar compressive atelectasis. 3. Central peribronchial wall thickening may be infectious/inflammatory. 4. Cardiomegaly. 5. 1 cm incidental left thyroid nodule. No follow-up imaging is recommended. Reference: J Am Coll Radiol. 2015 Feb;12(2): 143-50 Aortic Atherosclerosis (ICD10-I70.0). Electronically Signed   By: Ronney Asters M.D.   On: 10/20/2022 19:53   DG Chest Port 1 View  Result Date: 10/20/2022 CLINICAL DATA:  Shortness of breath EXAM: PORTABLE CHEST 1 VIEW COMPARISON:  Chest x-ray 07/24/2017.  Chest CT 04/07/2015. FINDINGS: There is a new right hilar/perihilar masslike density measuring 4.0 x 4.2 cm. There is a new small right pleural effusion. The cardiac silhouette is within normal limits. No evidence for pneumothorax or acute fracture. IMPRESSION: 1. New right hilar/perihilar masslike density measuring 4.0 x 4.2 cm. This is concerning for malignancy. Recommend further evaluation with chest CT with contrast. 2. New small right pleural effusion. Electronically Signed   By: Ronney Asters M.D.   On: 10/20/2022 18:25    Scheduled Meds:  amLODipine  2.5 mg Oral Daily   aspirin  81 mg Oral Daily   atorvastatin  80 mg Oral q1800   brimonidine  1 drop Both Eyes BID   Chlorhexidine Gluconate Cloth  6 each Topical Daily   famotidine  20 mg Oral Daily   furosemide  20 mg Intravenous Once   Loteprednol Etabonate  1 drop Right Eye BID   mirabegron ER  25 mg Oral Daily   sodium zirconium cyclosilicate  5 g Oral Once   Continuous Infusions:  meropenem (MERREM) IV  Stopped (10/21/22 2136)    LOS: 0 days   [name], DO [facility] Available via Epic secure chat 7am-7pm After these hours, please refer to coverage provider listed on [email or web address] 10/22/2022, 9:14 AM

## 2022-10-22 NOTE — Plan of Care (Signed)

## 2022-10-23 ENCOUNTER — Inpatient Hospital Stay (HOSPITAL_COMMUNITY): Payer: Medicare Other

## 2022-10-23 DIAGNOSIS — I5033 Acute on chronic diastolic (congestive) heart failure: Secondary | ICD-10-CM | POA: Diagnosis not present

## 2022-10-23 DIAGNOSIS — I10 Essential (primary) hypertension: Secondary | ICD-10-CM | POA: Diagnosis not present

## 2022-10-23 DIAGNOSIS — K219 Gastro-esophageal reflux disease without esophagitis: Secondary | ICD-10-CM | POA: Diagnosis not present

## 2022-10-23 DIAGNOSIS — E7800 Pure hypercholesterolemia, unspecified: Secondary | ICD-10-CM

## 2022-10-23 DIAGNOSIS — N183 Chronic kidney disease, stage 3 unspecified: Secondary | ICD-10-CM | POA: Diagnosis not present

## 2022-10-23 DIAGNOSIS — I69959 Hemiplegia and hemiparesis following unspecified cerebrovascular disease affecting unspecified side: Secondary | ICD-10-CM

## 2022-10-23 LAB — COMPREHENSIVE METABOLIC PANEL
ALT: 20 U/L (ref 0–44)
AST: 20 U/L (ref 15–41)
Albumin: 2.5 g/dL — ABNORMAL LOW (ref 3.5–5.0)
Alkaline Phosphatase: 89 U/L (ref 38–126)
Anion gap: 11 (ref 5–15)
BUN: 60 mg/dL — ABNORMAL HIGH (ref 8–23)
CO2: 26 mmol/L (ref 22–32)
Calcium: 9.2 mg/dL (ref 8.9–10.3)
Chloride: 103 mmol/L (ref 98–111)
Creatinine, Ser: 1.94 mg/dL — ABNORMAL HIGH (ref 0.44–1.00)
GFR, Estimated: 23 mL/min — ABNORMAL LOW (ref >60)
Glucose, Bld: 95 mg/dL (ref 70–99)
Potassium: 5.1 mmol/L (ref 3.5–5.1)
Sodium: 140 mmol/L (ref 135–145)
Total Bilirubin: 0.4 mg/dL (ref 0.3–1.2)
Total Protein: 5.9 g/dL — ABNORMAL LOW (ref 6.5–8.1)

## 2022-10-23 LAB — CBC WITH DIFFERENTIAL/PLATELET
Abs Immature Granulocytes: 0.08 10*3/uL — ABNORMAL HIGH (ref 0.00–0.07)
Basophils Absolute: 0 10*3/uL (ref 0.0–0.1)
Basophils Relative: 0 %
Eosinophils Absolute: 0.1 10*3/uL (ref 0.0–0.5)
Eosinophils Relative: 1 %
HCT: 29.5 % — ABNORMAL LOW (ref 36.0–46.0)
Hemoglobin: 8.9 g/dL — ABNORMAL LOW (ref 12.0–15.0)
Immature Granulocytes: 1 %
Lymphocytes Relative: 15 %
Lymphs Abs: 1.5 10*3/uL (ref 0.7–4.0)
MCH: 27.1 pg (ref 26.0–34.0)
MCHC: 30.2 g/dL (ref 30.0–36.0)
MCV: 89.7 fL (ref 80.0–100.0)
Monocytes Absolute: 1 10*3/uL (ref 0.1–1.0)
Monocytes Relative: 9 %
Neutro Abs: 7.6 10*3/uL (ref 1.7–7.7)
Neutrophils Relative %: 74 %
Platelets: 198 10*3/uL (ref 150–400)
RBC: 3.29 MIL/uL — ABNORMAL LOW (ref 3.87–5.11)
RDW: 16.4 % — ABNORMAL HIGH (ref 11.5–15.5)
WBC: 10.2 10*3/uL (ref 4.0–10.5)
nRBC: 0.4 % — ABNORMAL HIGH (ref 0.0–0.2)

## 2022-10-23 LAB — URINE CULTURE: Culture: >=100000 — AB

## 2022-10-23 LAB — MAGNESIUM: Magnesium: 1.8 mg/dL (ref 1.7–2.4)

## 2022-10-23 LAB — PHOSPHORUS: Phosphorus: 4.8 mg/dL — ABNORMAL HIGH (ref 2.5–4.6)

## 2022-10-23 MED ORDER — LEVALBUTEROL HCL 0.63 MG/3ML IN NEBU
0.6300 mg | INHALATION_SOLUTION | Freq: Four times a day (QID) | RESPIRATORY_TRACT | Status: DC
  Administered 2022-10-23: 0.63 mg via RESPIRATORY_TRACT
  Filled 2022-10-23: qty 3

## 2022-10-23 MED ORDER — IPRATROPIUM BROMIDE 0.02 % IN SOLN
0.5000 mg | Freq: Four times a day (QID) | RESPIRATORY_TRACT | Status: DC
  Administered 2022-10-23: 0.5 mg via RESPIRATORY_TRACT
  Filled 2022-10-23: qty 2.5

## 2022-10-23 MED ORDER — LEVALBUTEROL HCL 0.63 MG/3ML IN NEBU
0.6300 mg | INHALATION_SOLUTION | Freq: Three times a day (TID) | RESPIRATORY_TRACT | Status: DC
  Administered 2022-10-24 – 2022-10-26 (×7): 0.63 mg via RESPIRATORY_TRACT
  Filled 2022-10-23 (×8): qty 3

## 2022-10-23 MED ORDER — IPRATROPIUM BROMIDE 0.02 % IN SOLN
0.5000 mg | Freq: Three times a day (TID) | RESPIRATORY_TRACT | Status: DC
  Administered 2022-10-24 – 2022-10-26 (×7): 0.5 mg via RESPIRATORY_TRACT
  Filled 2022-10-23 (×8): qty 2.5

## 2022-10-23 NOTE — Plan of Care (Signed)
  Problem: Education: Goal: Knowledge of General Education information will improve Description: Including pain rating scale, medication(s)/side effects and non-pharmacologic comfort measures Outcome: Progressing   Problem: Health Behavior/Discharge Planning: Goal: Ability to manage health-related needs will improve Outcome: Progressing

## 2022-10-23 NOTE — Consult Note (Signed)
[place] Nurse Consult Note: Reason for Consult:Placement of bilateral Unna's Boots. Followed in the community by Dr. Marilynn Rail (Podiatric Medicine). Last seen by that provider on 10/12/22. To see next in 4 weeks. Wound type: venous insufficiency, neuropathic ulcerations Pressure Injury POA: N/A Measurement:Per last visit with Dr. Amalia Hailey,  Right plantar foot wound: partial; thickness measures 0.3cm x 0.3cm x 0.1cm Left posterior heel wound: to be measured by Bedside RN and documented on Nursing Flow Sheet Right lateral LE wound: to be measured by Bedside RN and documented on Nursing Flow Sheet Drainage (amount, consistency, odor) small, serous Periwound: friable Dressing procedure/placement/frequency: I will continue the POC in place from Dr. Amalia Hailey, i.e. twice weekly Unna's boots following application of topical care. LEs are to be cleansed with house skin cleanser and patted dry, then topical wound care will be to cover lesions with silver hydrofiber (Aquacel Ag+ Advantage) topped with dry gauze and secured with silicone foam dressings. Nursing is to then contact Ortho Tech for the application of Unna's Boots.  Boots are to be placed today, then Thursday, 10/27/22, then every Monday and Thursday routinely until the patient sees Dr. Amalia Hailey again.  [place] nursing team will not follow, but will remain available to this patient, the nursing and medical teams.  Please re-consult if needed.  Thank you for inviting [place] to participate in this patient's Plan of Care.  Maudie Flakes, MSN, RN, CNS, [place], Serita Grammes, [employer], [employer] phone:  [phone]

## 2022-10-23 NOTE — Progress Notes (Signed)
Patient transferred to 4th floor and left with primary RN. RN getting report said he would call family in the AM, did not want to wake them up in the middle of the night.

## 2022-10-23 NOTE — Progress Notes (Signed)
PROGRESS NOTE    [name]  QQP:[id number] DOB: 17-Sep-1923 DOA: 10/20/2022 PCP: [name], DO   Brief Narrative:  HPI per Dr. Tennis Must on 10/21/21 [name] is a 89 y.o. 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   No chest pain, palpitations, diaphoresis, PND or orthopnea No fever, chills or night sweats. No sore throat, rhinorrhea, dyspnea, wheezing or hemoptysis  No appetite changes, abdominal pain, diarrhea, constipation, melena or hematochezia.  No flank pain, dysuria, frequency or hematuria.  No polyuria, polydipsia, polyphagia or blurred vision.     The patient was hypothermic on arrival with a temperature of 91.1 F.  She was transferred to the stepdown unit.  Urine analysis showed pyuria, but no bacteriuria.  Given hypothermia and history of ESBL UTI, urine culture and sensitivity order followed by meropenem 500 mg twice a day IVPB.   ED course: Initial vital signs were temperature 97.6 F, pulse 54, respirations 16, BP 131/95 mmHg O2 sat 100% on room air.  The patient is now on nasal cannula oxygen at 2 LPM most recent O2 sat is 100%.  She received furosemide 60 mg IVP and her home meds were ordered in the ED including metoprolol.   Lab work: Coronavirus, RSV and influenza PCR was negative.  CBC is her white count 9.2, hemoglobin 9.4 g/dL platelets 255.  Troponin x 2 normal.  BNP 580.4 pg/mL.  CMP showed a potassium of 5.4 mmol/L, glucose 111, BUN 63 and creatinine 1.51 mg/dL.  The rest of the CMP measurements were normal.  Baseline creatinine in the last 2 years usually ranges between 1.2 and 1.6 mg/deciliter.   Imaging:  Portable 1 view chest radiograph with a new right hilar/perihilar masslike density measuring 4.0 x 4.2 cm concerning for malignancy.  There is a new small right pleural effusion.  CT chest with contrast with marked enlargement of the main pulmonary artery and right pulmonary artery compatible with pulmonary hypertension.  No hilar mass identified.  Small bilateral pleural effusions with bibasilar compressive atelectasis.  Central peribronchial wall thickening that may be infectious or inflammatory.  Cardiomegaly.  1 cm incidental left thyroid nodule with no follow-up recommended.   **Interim History Patient has continued to be diuresed given her volume overload.  She requires oxygen and will need to be weaned but she is improving.  PT OT to evaluate and PT OT recommending home health.  Renal function elevated in the setting of diuresis but will hold IV Lasix dose today given the slight bump in renal function.  Assessment and Plan:  Acute Respiratory Failure with Hypoxia in the setting of Acute on Chronic Diastolic CHF (congestive heart failure) (HCC) -Observation/SDU will change to inpatient telemetry -Supplemental oxygen as needed. -Sodium and fluid restriction. -Hold Furosemide Today  -SpO2: 95 % O2 Flow Rate (L/min): 2 L/min -Monitor daily weights, intake and output.;  -Patient is -95.089 Liters  -Monitor renal function electrolytes. -Check Echocardiogram and showed "Small mid cavitary gradient . Left ventricular ejection fraction, by estimation, is 60 to 65%. The left ventricle has normal function. The left ventricle has no regional wall motion abnormalities. There is moderate left ventricular hypertrophy. Left ventricular diastolic parameters are consistent with Grade II diastolic dysfunction (  pseudonormalization). Elevated left ventricular end-diastolic pressure." -DG Chest X-Ray done and showed "No interval change in the appearance of the chest compared to 10/21/2022.Persistent bilateral  layering pleural effusions and associated bibasilar atelectasis versus infiltrate."  -Hold beta-blocker due to bradycardia/CHF exacerbation. -Continue to monitor for signs and symptoms of volume overload and repeat chest x-ray in a.m. -Will add Xopenex/Atrovent, Flutter Valve, and Incentive Spirometry  -PT and OT to further evaluate and treat and PT recommending Home Health    Hypothermia, improved  -Warming measures. -TSH normal. -CXR with cardiomegaly and edema. -Urinalysis with pyuria. -Urine culture and sensitivity ordered. -Transferred to stepdown. -History of ESBL infection so meropenem started and family confirms that she was testing positive for a UTI   Hx of ESBL UTI but now showing Klebsiella Pneumoniae  -Urinalysis showed clear appearance with moderate leukocytes, negative nitrites, no bacteria seen, greater than 50 WBCs but urine culture showing greater than 100,000 colony-forming units of gram-negative rods and turned out to be Klebsiella Pneumoniae -Cx Sensitivities:  Klebsiella pneumoniae    MIC    AMPICILLIN >=32 RESIST... Resistant    AMPICILLIN/SULBACTAM 16 INTERMED... Intermediate    CEFAZOLIN <=4 SENSITIVE Sensitive    CEFEPIME <=0.12 SENS... Sensitive    CEFTRIAXONE <=0.25 SENS... Sensitive    CIPROFLOXACIN <=0.25 SENS... Sensitive    GENTAMICIN <=1 SENSITIVE Sensitive    IMIPENEM <=0.25 SENS... Sensitive    NITROFURANTOIN 128 RESISTANT Resistant    PIP/TAZO <=4 SENSITIVE Sensitive    TRIMETH/SULFA >=320 RESIS... Resistant   -Continuing IV meropenem but can de-escalate     Primary pulmonary hypertension (HCC) -Check echocardiogram and showed "There is severely elevated pulmonary artery systolic pressure. The tricuspid regurgitant velocity is  3.43 m/s, and with an assumed right atrial pressure of 15 mmHg, the estimated right ventricular systolic pressure is 93.9 mmHg " -CT Scan of the Chest w/ Contrast done and showed "Marked enlargement of the main pulmonary  artery and right pulmonary artery compatible with pulmonary arterial hypertension. No hilar mass identified. Small bilateral pleural effusions with bibasilar compressive atelectasis. Central peribronchial wall thickening may be infectious/inflammatory. Cardiomegaly. 1 cm incidental left thyroid nodule. No follow-up imaging is recommended." -IV diuresis now held given bump in Cr    Essential Hypertension -Continue amlodipine 2.5 mg p.o. daily. -Hold metoprolol due to bradycardia acute CHF. -Holding Lasix today -Continue to Monitor BP per Protocol; Last BP reading is now 124/51   CKD (chronic kidney disease), stage IIIb (HCC) -BUN/Cr Trend: Recent Labs  Lab 10/20/22 1658 10/21/22 1455 10/22/22 0301 10/23/22 0442  BUN 63* 61* 55* 60*  CREATININE 1.51* 1.49* 1.51* 1.94*  -Avoid Nephrotoxic Medications, Contrast Dyes, Hypotension and Dehydration to Ensure Adequate Renal Perfusion and will need to Renally Adjust Meds -Continue to Monitor and Trend Renal Function carefully and repeat CMP in the AM    Hyperlipidemia -Continue Atorvastatin 80 mg p.o. daily.   Gastroesophageal reflux disease -Continue famotidine 20 mg p.o. daily.   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 -PT/OT to Evaluate and Treat OT recommending SNF but PT recommending home health   Iron Deficiency Anemia -Hgb/Hct Trend: Recent Labs  Lab 10/20/22 1658 10/21/22 1455 10/22/22 0301 10/23/22 0442  HGB 9.4* 9.1* 9.1* 8.9*  HCT 32.2* 31.4* 30.2* 29.5*  MCV 91.0 92.6 89.3 89.7  -Check Anemia Panel in the Am -Continue to Monitor for S/Sx of Bleeding; No overt bleeding noted -Repeat CBC in the AM    Glaucoma -Continue current eyedrops, except  for timolol.   Hyperkalemia -K+ Trend: Recent Labs  Lab 10/20/22 1658 10/21/22 1455 10/22/22 0301 10/23/22 0442  K 5.4* 5.2* 5.8* 5.1  -Given Lasix and Lokelma with improvement; will hold Lasix dose today  given renal function -Continue to Monitor and Trend   Hypoalbuminemia -Patient's Albumin Level is now 2.6 -> 2.5 -Continue to Monitor and Trend and repeat CMP in the AM    Venous Insufficiency and Neuropathic Ulcerations -WOC Nurse consulted -C/w Louretta Parma Boots   DVT prophylaxis: SCDs Start: 10/21/22 1423    Code Status: DNR Family Communication: Discussed with Daughters at bedside   Disposition Plan:  Level of care: Telemetry Status is: Inpatient Remains inpatient appropriate because: Further clinical improvement in her respiratory status prior to safe discharge disposition and will need her Unna boot change.  She is slowly improving and getting close to being weaned off of oxygen.  Will hold today's diuresis given her renal function.   Consultants:  None  Procedures:  ECHOCARDIOGRAM IMPRESSIONS     1. Small mid cavitary gradient . Left ventricular ejection fraction, by  estimation, is 60 to 65%. The left ventricle has normal function. The left  ventricle has no regional wall motion abnormalities. There is moderate  left ventricular hypertrophy. Left  ventricular diastolic parameters are consistent with Grade II diastolic  dysfunction (pseudonormalization). Elevated left ventricular end-diastolic  pressure.   2. Right ventricular systolic function is normal. The right ventricular  size is normal. There is severely elevated pulmonary artery systolic  pressure.   3. Mean gradient across valve in diastole 12 mmHg but MVA normal by PT1/2  secondary to MAC. The mitral valve is normal in structure. Mild mitral  valve regurgitation. No evidence of mitral stenosis. Moderate mitral  annular calcification.   4. Tricuspid valve regurgitation is moderate.   5. The aortic valve is tricuspid. There is moderate calcification of the  aortic valve. There is moderate thickening of the aortic valve. Aortic  valve regurgitation is not visualized. Aortic valve  sclerosis/calcification is  present, without any evidence  of aortic stenosis.   6. The inferior vena cava is normal in size with greater than 50%  respiratory variability, suggesting right atrial pressure of 3 mmHg.   FINDINGS   Left Ventricle: Small mid cavitary gradient. Left ventricular ejection  fraction, by estimation, is 60 to 65%. The left ventricle has normal  function. The left ventricle has no regional wall motion abnormalities.  The left ventricular internal cavity size   was normal in size. There is moderate left ventricular hypertrophy. Left  ventricular diastolic parameters are consistent with Grade II diastolic  dysfunction (pseudonormalization). Elevated left ventricular end-diastolic  pressure.   Right Ventricle: The right ventricular size is normal. No increase in  right ventricular wall thickness. Right ventricular systolic function is  normal. There is severely elevated pulmonary artery systolic pressure. The  tricuspid regurgitant velocity is  3.43 m/s, and with an assumed right atrial pressure of 15 mmHg, the  estimated right ventricular systolic pressure is 64.3 mmHg.   Left Atrium: Left atrial size was normal in size.   Right Atrium: Right atrial size was normal in size.   Pericardium: There is no evidence of pericardial effusion.   Mitral Valve: Mean gradient across valve in diastole 12 mmHg but MVA  normal by PT1/2 secondary to MAC. The mitral valve is normal in structure.  There is moderate thickening of the mitral valve leaflet(s). Moderate  mitral annular calcification. Mild  mitral valve regurgitation.  No evidence of mitral valve stenosis. MV peak  gradient, 23.8 mmHg. The mean mitral valve gradient is 12.0 mmHg.   Tricuspid Valve: The tricuspid valve is normal in structure. Tricuspid  valve regurgitation is moderate . No evidence of tricuspid stenosis.   Aortic Valve: The aortic valve is tricuspid. There is moderate  calcification of the aortic valve. There is moderate  thickening of the  aortic valve. Aortic valve regurgitation is not visualized. Aortic valve  sclerosis/calcification is present, without any   evidence of aortic stenosis. Aortic valve mean gradient measures 11.0  mmHg. Aortic valve peak gradient measures 19.7 mmHg. Aortic valve area, by  VTI measures 2.46 cm.   Pulmonic Valve: The pulmonic valve was normal in structure. Pulmonic valve  regurgitation is not visualized. No evidence of pulmonic stenosis.   Aorta: The aortic root is normal in size and structure.   Venous: The inferior vena cava is normal in size with greater than 50%  respiratory variability, suggesting right atrial pressure of 3 mmHg.   IAS/Shunts: No atrial level shunt detected by color flow Doppler.     LEFT VENTRICLE  PLAX 2D  LVIDd:         3.30 cm   Diastology  LVIDs:         2.00 cm   LV e' medial:    4.57 cm/s  LV PW:         1.40 cm   LV E/e' medial:  35.2  LV IVS:        1.50 cm   LV e' lateral:   4.68 cm/s  LVOT diam:     2.10 cm   LV E/e' lateral: 34.4  LV SV:         116  LV SV Index:   72  LVOT Area:     3.46 cm     RIGHT VENTRICLE             IVC  RV S prime:     10.20 cm/s  IVC diam: 2.05 cm  TAPSE (M-mode): 2.0 cm   LEFT ATRIUM             Index        RIGHT ATRIUM           Index  LA diam:        2.90 cm 1.80 cm/m   RA Area:     13.10 cm  LA Vol (A2C):   61.1 ml 37.86 ml/m  RA Volume:   29.50 ml  18.28 ml/m  LA Vol (A4C):   35.0 ml 21.69 ml/m  LA Biplane Vol: 49.9 ml 30.92 ml/m   AORTIC VALVE  AV Area (Vmax):    2.34 cm  AV Area (Vmean):   2.36 cm  AV Area (VTI):     2.46 cm  AV Vmax:           222.00 cm/s  AV Vmean:          152.667 cm/s  AV VTI:            0.470 m  AV Peak Grad:      19.7 mmHg  AV Mean Grad:      11.0 mmHg  LVOT Vmax:         150.00 cm/s  LVOT Vmean:        104.000 cm/s  LVOT VTI:          0.334 m  LVOT/AV VTI ratio: 0.71  AORTA  Ao Root diam: 3.30 cm  Ao Asc diam:  3.30 cm   MITRAL VALVE                   TRICUSPID VALVE  MV Area (PHT): 3.42 cm       TR Peak grad:   47.1 mmHg  MV Area VTI:   2.00 cm       TR Vmax:        343.00 cm/s  MV Peak grad:  23.8 mmHg  MV Mean grad:  12.0 mmHg      SHUNTS  MV Vmax:       2.44 m/s       Systemic VTI:  0.33 m  MV Vmean:      170.0 cm/s     Systemic Diam: 2.10 cm  MV Decel Time: 222 msec  MR Peak grad:    106.1 mmHg  MR Mean grad:    63.0 mmHg  MR Vmax:         515.00 cm/s  MR Vmean:        381.0 cm/s  MR PISA:         2.26 cm  MR PISA Eff ROA: 17 mm  MR PISA Radius:  0.60 cm  MV E velocity: 161.00 cm/s  MV A velocity: 180.00 cm/s  MV E/A ratio:  0.89   Antimicrobials:  Anti-infectives (From admission, onward)    Start     Dose/Rate Route Frequency Ordered Stop   10/21/22 2000  cefTRIAXone (ROCEPHIN) 1 g in sodium chloride 0.9 % 100 mL IVPB  Status:  Discontinued        1 g 200 mL/hr over 30 Minutes Intravenous Every 24 hours 10/21/22 1859 10/21/22 1906   10/21/22 1915  meropenem (MERREM) 1,000 mg in sodium chloride 0.9 % 100 mL IVPB       Note to Pharmacy: Adjust dose as needed.  History of ESBL UTIs presenting with hypothermia and pyuria on urine analysis.   1,000 mg 200 mL/hr over 30 Minutes Intravenous Every 12 hours 10/21/22 1906         Subjective: Seen and examined at bedside and she is doing better and family thinks that she is getting close to her baseline or is at her baseline.  She continues to wear oxygen and will need to wean.  PT OT about to work with the patient.  Patient denies any nausea or vomiting.  No other concerns or complaints this time.  Objective: Vitals:   10/23/22 0500 10/23/22 0756 10/23/22 1140 10/23/22 1407  BP:  139/62 (!) 130/51 (!) 124/51  Pulse:  94  88  Resp:  14  18  Temp:  98.3 F (36.8 C)  97.8 F (36.6 C)  TempSrc:  Oral  Oral  SpO2:  98%  95%  Weight: 63 kg     Height:        Intake/Output Summary (Last 24 hours) at 10/23/2022 1618 Last data filed at 10/23/2022 1150 Gross per 24  hour  Intake 350 ml  Output 1800 ml  Net -1450 ml   Filed Weights   10/21/22 1850 10/22/22 0430 10/23/22 0500  Weight: 64.2 kg 64.4 kg 63 kg   Examination: Physical Exam:  Constitutional: WN/WD, overweight elderly Caucasian female currently no acute distress Respiratory: Diminished to auscultation bilaterally with coarse breath sounds and has some slight crackles, but no appreciable no wheezing, rales, rhonchi. Normal respiratory effort and patient is not tachypenic. No accessory muscle  use.  Unlabored breathing but she is wearing supplemental oxygen via nasal cannula Cardiovascular: RRR, no murmurs / rubs / gallops. S1 and S2 auscultated.  Has 1+ lower extremity edema Abdomen: Soft, non-tender, mildly distended secondary to body habitus. Bowel sounds positive.  GU: Deferred. Musculoskeletal: No clubbing / cyanosis of digits/nails. No joint deformity upper and lower extremities.  Skin: Bilateral lower extremities are wrapped in Unna boots Neurologic: CN 2-12 grossly intact with no appreciable focal deficits.  Romberg sign cerebellar reflexes not assessed.  Psychiatric: Normal judgment and insight. Alert and oriented x 2. Normal mood and appropriate affect.   Data Reviewed: I have personally reviewed following labs and imaging studies  CBC: Recent Labs  Lab 10/20/22 1658 10/21/22 1455 10/22/22 0301 10/23/22 0442  WBC 9.2 6.5 9.6 10.2  NEUTROABS 7.0  --   --  7.6  HGB 9.4* 9.1* 9.1* 8.9*  HCT 32.2* 31.4* 30.2* 29.5*  MCV 91.0 92.6 89.3 89.7  PLT 255 212 177 876   Basic Metabolic Panel: Recent Labs  Lab 10/20/22 1658 10/21/22 1455 10/22/22 0301 10/23/22 0442  NA 140 140 142 140  K 5.4* 5.2* 5.8* 5.1  CL 110 108 108 103  CO2 _0 GLUCOSE 111* 94 64* 95  BUN 63* 61* 55* 60*  CREATININE 1.51* 1.49* 1.51* 1.94*  CALCIUM 9.4 9.3 9.5 9.2  MG  --  2.1  --  1.8  PHOS  --  5.7*  --  4.8*   GFR: Estimated Creatinine Clearance: 13.7 mL/min (A) (by C-G formula based  on SCr of 1.94 mg/dL (H)). Liver Function Tests: Recent Labs  Lab 10/20/22 1658 10/22/22 0301 10/23/22 0442  AST _1 ALT _2 ALKPHOS 110 91 89  BILITOT 0.3 0.7 0.4  PROT 6.5 5.5* 5.9*  ALBUMIN 3.5 2.6* 2.5*   No results for input(s): "LIPASE", "AMYLASE" in the last 168 hours. No results for input(s): "AMMONIA" in the last 168 hours. Coagulation Profile: No results for input(s): "INR", "PROTIME" in the last 168 hours. Cardiac Enzymes: No results for input(s): "CKTOTAL", "CKMB", "CKMBINDEX", "TROPONINI" in the last 168 hours. BNP (last 3 results) No results for input(s): "PROBNP" in the last 8760 hours. HbA1C: No results for input(s): "HGBA1C" in the last 72 hours. CBG: Recent Labs  Lab 10/22/22 0616  GLUCAP 113*   Lipid Profile: No results for input(s): "CHOL", "HDL", "LDLCALC", "TRIG", "CHOLHDL", "LDLDIRECT" in the last 72 hours. Thyroid Function Tests: Recent Labs    10/21/22 1455  TSH 3.088   Anemia Panel: No results for input(s): "VITAMINB12", "FOLATE", "FERRITIN", "TIBC", "IRON", "RETICCTPCT" in the last 72 hours. Sepsis Labs: No results for input(s): "PROCALCITON", "LATICACIDVEN" in the last 168 hours.  Recent Results (from the past 240 hour(s))  Resp panel by RT-PCR (RSV, Flu A&B, Covid) Anterior Nasal Swab     Status: None   Collection Time: 10/20/22  6:55 PM   Specimen: Anterior Nasal Swab  Result Value Ref Range Status   SARS Coronavirus 2 by RT PCR NEGATIVE NEGATIVE Final    Comment: (NOTE) SARS-CoV-2 target nucleic acids are NOT DETECTED.  The SARS-CoV-2 RNA is generally detectable in upper respiratory specimens during the acute phase of infection. The lowest concentration of SARS-CoV-2 viral copies this assay can detect is 138 copies/mL. A negative result does not preclude SARS-Cov-2 infection and should not be used as the sole basis for treatment or other patient management decisions. A negative result may occur with  improper  specimen collection/handling, submission of specimen other than nasopharyngeal swab, presence of viral mutation(s) within the areas targeted by this assay, and inadequate number of viral copies(<138 copies/mL). A negative result must be combined with clinical observations, patient history, and epidemiological information. The expected result is Negative.  Fact Sheet for Patients:  EntrepreneurPulse.com.au  Fact Sheet for Healthcare Providers:  IncredibleEmployment.be  This test is no t yet approved or cleared by the [place] FDA and  has been authorized for detection and/or diagnosis of SARS-CoV-2 by FDA under an Emergency Use Authorization (EUA). This EUA will remain  in effect (meaning this test can be used) for the duration of the COVID-19 declaration under Section 564(b)(1) of the Act, 21 U.S.C.section 360bbb-3(b)(1), unless the authorization is terminated  or revoked sooner.       Influenza A by PCR NEGATIVE NEGATIVE Final   Influenza B by PCR NEGATIVE NEGATIVE Final    Comment: (NOTE) The Xpert Xpress SARS-CoV-2/FLU/RSV plus assay is intended as an aid in the diagnosis of influenza from Nasopharyngeal swab specimens and should not be used as a sole basis for treatment. Nasal washings and aspirates are unacceptable for Xpert Xpress SARS-CoV-2/FLU/RSV testing.  Fact Sheet for Patients: EntrepreneurPulse.com.au  Fact Sheet for Healthcare Providers: IncredibleEmployment.be  This test is not yet approved or cleared by the [place] FDA and has been authorized for detection and/or diagnosis of SARS-CoV-2 by FDA under an Emergency Use Authorization (EUA). This EUA will remain in effect (meaning this test can be used) for the duration of the COVID-19 declaration under Section 564(b)(1) of the Act, 21 U.S.C. section 360bbb-3(b)(1), unless the authorization is terminated or revoked.     Resp  Syncytial Virus by PCR NEGATIVE NEGATIVE Final    Comment: (NOTE) Fact Sheet for Patients: EntrepreneurPulse.com.au  Fact Sheet for Healthcare Providers: IncredibleEmployment.be  This test is not yet approved or cleared by the [place] FDA and has been authorized for detection and/or diagnosis of SARS-CoV-2 by FDA under an Emergency Use Authorization (EUA). This EUA will remain in effect (meaning this test can be used) for the duration of the COVID-19 declaration under Section 564(b)(1) of the Act, 21 U.S.C. section 360bbb-3(b)(1), unless the authorization is terminated or revoked.  Performed at [facility], [address], [place], [place] [address]   MRSA Next Gen by PCR, Nasal     Status: None   Collection Time: 10/21/22  6:49 PM   Specimen: Nasal Mucosa; Nasal Swab  Result Value Ref Range Status   MRSA by PCR Next Gen NOT DETECTED NOT DETECTED Final    Comment: (NOTE) The GeneXpert MRSA Assay (FDA approved for NASAL specimens only), is one component of a comprehensive MRSA colonization surveillance program. It is not intended to diagnose MRSA infection nor to guide or monitor treatment for MRSA infections. Test performance is not FDA approved in patients less than 70 years old. Performed at [facility], [place] [address], [place], [place] [address]   Urine Culture     Status: Abnormal   Collection Time: 10/21/22  9:30 PM   Specimen: Urine, Catheterized  Result Value Ref Range Status   Specimen Description   Final    URINE, CATHETERIZED Performed at [facility] [address] [place], [place] [address]    Special Requests   Final    NONE Performed at [facility], [place] [address], [place], [place] [address]    Culture >=100,000 COLONIES/mL KLEBSIELLA PNEUMONIAE (A)  Final  Report Status 10/23/2022 FINAL  Final   Organism ID, Bacteria KLEBSIELLA  PNEUMONIAE (A)  Final      Susceptibility   Klebsiella pneumoniae - MIC*    AMPICILLIN >=32 RESISTANT Resistant     CEFAZOLIN <=4 SENSITIVE Sensitive     CEFEPIME <=0.12 SENSITIVE Sensitive     CEFTRIAXONE <=0.25 SENSITIVE Sensitive     CIPROFLOXACIN <=0.25 SENSITIVE Sensitive     GENTAMICIN <=1 SENSITIVE Sensitive     IMIPENEM <=0.25 SENSITIVE Sensitive     NITROFURANTOIN 128 RESISTANT Resistant     TRIMETH/SULFA >=320 RESISTANT Resistant     AMPICILLIN/SULBACTAM 16 INTERMEDIATE Intermediate     PIP/TAZO <=4 SENSITIVE Sensitive     * >=100,000 COLONIES/mL KLEBSIELLA PNEUMONIAE    Radiology Studies: DG CHEST PORT 1 VIEW  Result Date: 10/23/2022 CLINICAL DATA:  Short of breath EXAM: PORTABLE CHEST 1 VIEW COMPARISON:  Prior chest x-ray 10/21/2022 FINDINGS: Unchanged appearance of the lungs with cardiomegaly, enlarged pulmonary arteries, bilateral layering effusions and bibasilar atelectasis. Chronic bronchitic changes and interstitial prominence are similar. No pneumothorax. No new airspace opacities. No acute osseous abnormality. IMPRESSION: No interval change in the appearance of the chest compared to 10/21/2022. Persistent bilateral layering pleural effusions and associated bibasilar atelectasis versus infiltrate. Electronically Signed   By: Jacqulynn Cadet M.D.   On: 10/23/2022 07:45   ECHOCARDIOGRAM COMPLETE  Result Date: 10/22/2022    ECHOCARDIOGRAM REPORT   Patient Name:   BREYONA SWANDER Date of Exam: 10/22/2022 Medical Rec #:  [id number]     Height:       60.0 in Accession #:    [id number]    Weight:       142.0 lb Date of Birth:  08-25-1923      BSA:          1.614 m Patient Age:    37 years      BP:           168/59 mmHg Patient Gender: F             HR:           77 bpm. Exam Location:  Inpatient Procedure: 2D Echo, Cardiac Doppler and Color Doppler Indications:    R06.02 SOB; I50.40* Unspecified combined systolic (congestive)                 and diastolic (congestive) heart failure   History:        Patient has prior history of Echocardiogram examinations, most                 recent 08/19/2015. CHF and Cardiomegaly, Previous Myocardial                 Infarction, Stroke and TIA, Signs/Symptoms:Edema; Risk                 Factors:Hypertension and Dyslipidemia.  Sonographer:    Roseanna Rainbow RDCS Referring Phys: [id number] [place]  1. Small mid cavitary gradient . Left ventricular ejection fraction, by estimation, is 60 to 65%. The left ventricle has normal function. The left ventricle has no regional wall motion abnormalities. There is moderate left ventricular hypertrophy. Left ventricular diastolic parameters are consistent with Grade II diastolic dysfunction (pseudonormalization). Elevated left ventricular end-diastolic pressure.  2. Right ventricular systolic function is normal. The right ventricular size is normal. There is severely elevated pulmonary artery systolic pressure.  3. Mean gradient across valve in diastole 12 mmHg but MVA normal by PT1/2 secondary  to MAC. The mitral valve is normal in structure. Mild mitral valve regurgitation. No evidence of mitral stenosis. Moderate mitral annular calcification.  4. Tricuspid valve regurgitation is moderate.  5. The aortic valve is tricuspid. There is moderate calcification of the aortic valve. There is moderate thickening of the aortic valve. Aortic valve regurgitation is not visualized. Aortic valve sclerosis/calcification is present, without any evidence of aortic stenosis.  6. The inferior vena cava is normal in size with greater than 50% respiratory variability, suggesting right atrial pressure of 3 mmHg. FINDINGS  Left Ventricle: Small mid cavitary gradient. Left ventricular ejection fraction, by estimation, is 60 to 65%. The left ventricle has normal function. The left ventricle has no regional wall motion abnormalities. The left ventricular internal cavity size  was normal in size. There is moderate left ventricular  hypertrophy. Left ventricular diastolic parameters are consistent with Grade II diastolic dysfunction (pseudonormalization). Elevated left ventricular end-diastolic pressure. Right Ventricle: The right ventricular size is normal. No increase in right ventricular wall thickness. Right ventricular systolic function is normal. There is severely elevated pulmonary artery systolic pressure. The tricuspid regurgitant velocity is 3.43 m/s, and with an assumed right atrial pressure of 15 mmHg, the estimated right ventricular systolic pressure is 58.5 mmHg. Left Atrium: Left atrial size was normal in size. Right Atrium: Right atrial size was normal in size. Pericardium: There is no evidence of pericardial effusion. Mitral Valve: Mean gradient across valve in diastole 12 mmHg but MVA normal by PT1/2 secondary to MAC. The mitral valve is normal in structure. There is moderate thickening of the mitral valve leaflet(s). Moderate mitral annular calcification. Mild mitral valve regurgitation. No evidence of mitral valve stenosis. MV peak gradient, 23.8 mmHg. The mean mitral valve gradient is 12.0 mmHg. Tricuspid Valve: The tricuspid valve is normal in structure. Tricuspid valve regurgitation is moderate . No evidence of tricuspid stenosis. Aortic Valve: The aortic valve is tricuspid. There is moderate calcification of the aortic valve. There is moderate thickening of the aortic valve. Aortic valve regurgitation is not visualized. Aortic valve sclerosis/calcification is present, without any  evidence of aortic stenosis. Aortic valve mean gradient measures 11.0 mmHg. Aortic valve peak gradient measures 19.7 mmHg. Aortic valve area, by VTI measures 2.46 cm. Pulmonic Valve: The pulmonic valve was normal in structure. Pulmonic valve regurgitation is not visualized. No evidence of pulmonic stenosis. Aorta: The aortic root is normal in size and structure. Venous: The inferior vena cava is normal in size with greater than 50% respiratory  variability, suggesting right atrial pressure of 3 mmHg. IAS/Shunts: No atrial level shunt detected by color flow Doppler.  LEFT VENTRICLE PLAX 2D LVIDd:         3.30 cm   Diastology LVIDs:         2.00 cm   LV e' medial:    4.57 cm/s LV PW:         1.40 cm   LV E/e' medial:  35.2 LV IVS:        1.50 cm   LV e' lateral:   4.68 cm/s LVOT diam:     2.10 cm   LV E/e' lateral: 34.4 LV SV:         116 LV SV Index:   72 LVOT Area:     3.46 cm  RIGHT VENTRICLE             IVC RV S prime:     10.20 cm/s  IVC diam: 2.05 cm TAPSE (M-mode): 2.0 cm  LEFT ATRIUM             Index        RIGHT ATRIUM           Index LA diam:        2.90 cm 1.80 cm/m   RA Area:     13.10 cm LA Vol (A2C):   61.1 ml 37.86 ml/m  RA Volume:   29.50 ml  18.28 ml/m LA Vol (A4C):   35.0 ml 21.69 ml/m LA Biplane Vol: 49.9 ml 30.92 ml/m  AORTIC VALVE AV Area (Vmax):    2.34 cm AV Area (Vmean):   2.36 cm AV Area (VTI):     2.46 cm AV Vmax:           222.00 cm/s AV Vmean:          152.667 cm/s AV VTI:            0.470 m AV Peak Grad:      19.7 mmHg AV Mean Grad:      11.0 mmHg LVOT Vmax:         150.00 cm/s LVOT Vmean:        104.000 cm/s LVOT VTI:          0.334 m LVOT/AV VTI ratio: 0.71  AORTA Ao Root diam: 3.30 cm Ao Asc diam:  3.30 cm MITRAL VALVE                  TRICUSPID VALVE MV Area (PHT): 3.42 cm       TR Peak grad:   47.1 mmHg MV Area VTI:   2.00 cm       TR Vmax:        343.00 cm/s MV Peak grad:  23.8 mmHg MV Mean grad:  12.0 mmHg      SHUNTS MV Vmax:       2.44 m/s       Systemic VTI:  0.33 m MV Vmean:      170.0 cm/s     Systemic Diam: 2.10 cm MV Decel Time: 222 msec MR Peak grad:    106.1 mmHg MR Mean grad:    63.0 mmHg MR Vmax:         515.00 cm/s MR Vmean:        381.0 cm/s MR PISA:         2.26 cm MR PISA Eff ROA: 17 mm MR PISA Radius:  0.60 cm MV E velocity: 161.00 cm/s MV A velocity: 180.00 cm/s MV E/A ratio:  0.89 Jenkins Rouge MD Electronically signed by Jenkins Rouge MD Signature Date/Time: 10/22/2022/9:10:47 AM    Final     DG CHEST PORT 1 VIEW  Result Date: 10/21/2022 CLINICAL DATA:  Acute CHF EXAM: PORTABLE CHEST 1 VIEW COMPARISON:  10/20/2022 chest x-ray and chest CT, radiograph 07/24/2017 FINDINGS: Cardiomegaly with central congestion and small pleural effusions, not significantly changed. Aortic atherosclerosis. No pneumothorax IMPRESSION: Cardiomegaly with central congestion and small pleural effusions. Electronically Signed   By: Donavan Foil M.D.   On: 10/21/2022 18:22    Scheduled Meds:  amLODipine  2.5 mg Oral Daily   aspirin  81 mg Oral Daily   atorvastatin  80 mg Oral q1800   brimonidine  1 drop Both Eyes BID   Chlorhexidine Gluconate Cloth  6 each Topical Daily   famotidine  20 mg Oral Daily   Loteprednol Etabonate  1 drop Right Eye BID   mirabegron ER  25 mg Oral Daily  Continuous Infusions:  meropenem (MERREM) IV 1,000 mg (10/23/22 1244)    LOS: 1 day   [name], DO [facility] Available via Epic secure chat 7am-7pm After these hours, please refer to coverage provider listed on [email or web address] 10/23/2022, 4:18 PM

## 2022-10-23 NOTE — Evaluation (Signed)
Physical Therapy Evaluation Patient Details Name: [name] MRN: [id number] DOB: 01/18/1923 Today's Date: 10/23/2022  History of Present Illness  89 yr old female admitted to the hospital with dyspnea and BLE edema. She was found to have acute on chronic diastolic CHF, hypothermia, and possible UTI. PMH. CAD, NSTEMI, anemia, OA, CVA, glaucoma, macular degeneration, gout, HTN  Clinical Impression  On eval,  pt required Min-Mod A for mobility. She walked ~15 feet x 2 with a RW. Pt presents with general weakness, decreased activity tolerance, and impaired gait and balance. Pt fatigues fairly easily with activity. 2 daughters were present during session to observe. Discussed d/c plan during session-family prefers pt return home with HHPT f/u. Informed family that pt will very likely require increased assistance compared to her baseline. Will plan to follow pt during this hospital stay.        Recommendations for follow up therapy are one component of a multi-disciplinary discharge planning process, led by the attending physician.  Recommendations may be updated based on patient status, additional functional criteria and insurance authorization.  Follow Up Recommendations Home health PT (family prefers home with HHPT)      Assistance Recommended at Discharge Frequent or constant Supervision/Assistance  Patient can return home with the following  A little help with walking and/or transfers;A little help with bathing/dressing/bathroom;Assistance with cooking/housework;Assist for transportation;Help with stairs or ramp for entrance    Equipment Recommendations None recommended by PT  Recommendations for Other Services       Functional Status Assessment Patient has had a recent decline in their functional status and demonstrates the ability to make significant improvements in function in a reasonable and predictable amount of time.     Precautions / Restrictions Precautions Precautions:  Fall Restrictions Weight Bearing Restrictions: No Other Position/Activity Restrictions: chronic bilateral feet wounds. wears specialized shoes      Mobility  Bed Mobility               General bed mobility comments: oob in recliner    Transfers Overall transfer level: Needs assistance Equipment used: Rolling walker (2 wheels) Transfers: Sit to/from Stand Sit to Stand: Mod assist, Min assist           General transfer comment: Min A to rise from recliner. Mod A to rise from low toilet in bathroom. Increased time. Cues provided. Assist to power up, stabilize, control descent.    Ambulation/Gait Ambulation/Gait assistance: Min assist Gait Distance (Feet): 12 Feet (x2) Assistive device: Rolling walker (2 wheels) Gait Pattern/deviations: Step-through pattern, Decreased stride length       General Gait Details: Min A to steady pt and manage RW. Slow gait speed. Unsteady. Fatigues fairly easily. Seated rest break between walks.  Stairs            Wheelchair Mobility    Modified Rankin (Stroke Patients Only)       Balance Overall balance assessment: Needs assistance         Standing balance support: Bilateral upper extremity supported, Reliant on assistive device for balance, During functional activity Standing balance-Leahy Scale: Poor                               Pertinent Vitals/Pain Pain Assessment Pain Assessment: Faces Faces Pain Scale: Hurts little more Pain Location: She reported having chronic bilateral feet pain due to sores. Pain Descriptors / Indicators: Discomfort, Sore Pain Intervention(s): Monitored during session, Limited activity within  patient's tolerance, Repositioned    Home Living Family/patient expects to be discharged to:: Private residence Living Arrangements: Children Available Help at Discharge: Family;Available PRN/intermittently Type of Home: House Home Access: Stairs to enter Entrance Stairs-Rails:  Psychiatric nurse of Steps: 4   Home Layout: One level Home Equipment: Rollator (4 wheels);BSC/3in1;Tub bench;Cane - single point;Wheelchair - manual (lift chair) Additional Comments: 2 daughters assist pt, alternating weeks, one stays with her but other uses video monitor (she may be able to arrange staying with pt in her home if she can get permission at work to work remotely)    Prior Function Prior Level of Function : Needs assist             Mobility Comments: She ambulated without assistance in the home using a rollator; sometimes used a cane; used a manual wheelchair for community mobility. ADLs Comments: She performed toileting and feeding without assistance, she needed minimal assist for bathing at tub level, and she could perform dresssing except for needing assistance for bra management & donning/doffing her socks and shoes. Her family managed the household chores. The pt does not drive.     Hand Dominance        Extremity/Trunk Assessment   Upper Extremity Assessment Upper Extremity Assessment: Defer to OT evaluation    Lower Extremity Assessment Lower Extremity Assessment: Generalized weakness    Cervical / Trunk Assessment Cervical / Trunk Assessment: Kyphotic  Communication   Communication: HOH  Cognition Arousal/Alertness: Awake/alert Behavior During Therapy: WFL for tasks assessed/performed Overall Cognitive Status: Within Functional Limits for tasks assessed                                          General Comments      Exercises     Assessment/Plan    PT Assessment Patient needs continued PT services  PT Problem List Decreased strength;Decreased activity tolerance;Decreased balance;Decreased mobility;Decreased knowledge of use of DME;Pain       PT Treatment Interventions DME instruction;Gait training;Therapeutic exercise;Balance training;Functional mobility training;Therapeutic activities;Patient/family  education    PT Goals (Current goals can be found in the Care Plan section)  Acute Rehab PT Goals Patient Stated Goal: home per family PT Goal Formulation: With family Time For Goal Achievement: 11/06/22 Potential to Achieve Goals: Good    Frequency Min 3X/week     Co-evaluation               AM-PAC PT "6 Clicks" Mobility  Outcome Measure Help needed turning from your back to your side while in a flat bed without using bedrails?: A Lot Help needed moving from lying on your back to sitting on the side of a flat bed without using bedrails?: A Lot Help needed moving to and from a bed to a chair (including a wheelchair)?: A Little Help needed standing up from a chair using your arms (e.g., wheelchair or bedside chair)?: A Little Help needed to walk in hospital room?: A Little Help needed climbing 3-5 steps with a railing? : A Lot 6 Click Score: 15    End of Session Equipment Utilized During Treatment: Gait belt Activity Tolerance: Patient tolerated treatment well;Patient limited by fatigue Patient left: in chair;with call bell/phone within reach;with family/visitor present   PT Visit Diagnosis: Muscle weakness (generalized) (M62.81);Difficulty in walking, not elsewhere classified (R26.2)    Time: 1610-9604 PT Time Calculation (min) (ACUTE ONLY): 24 min  Charges:   PT Evaluation $PT Eval Low Complexity: 1 Low PT Treatments $Gait Training: 8-22 mins           Doreatha Massed, PT Acute Rehabilitation  Office: [phone]

## 2022-10-23 NOTE — Evaluation (Signed)
Occupational Therapy Evaluation Patient Details Name: [name] MRN: [id number] DOB: 08-10-1923 Today's Date: 10/23/2022   History of Present Illness [name] is a 89 yr old female admitted to the hospital with dyspnea and BLE edema. She was found to have acute on chronic diastolic CHF, hypothermia, and possible UTI. PMH. CAD, NSTEMI, anemia, OA, CVA, glaucoma, macular degeneration, gout, HTN   Clinical Impression   The patient is currently presenting below her baseline level of functioning for self-care management, due to deconditioning, generalized weakness, and impaired functional mobility. Per her daughters, the pt has chronic wounds of her feet, with the pt reporting associated pain when performing sit to stand. The pt was initially unable to demonstrate adequate standing tolerance and advancing her BLE after performing two sit to stand transfers. On the third sit to stand, she required assistance to perform a stand-pivot transfer to the bedside chair. She will benefit from further OT services to facilitate progressive ADL performance & to decrease the risk for further weakness and deconditioning. Short-term SNF rehab is recommended at current, however the pt's daughter are to decide on whether or not they want to purse SNF vs. Home therapy for the patient upon her hospital discharge.      Recommendations for follow up therapy are one component of a multi-disciplinary discharge planning process, led by the attending physician.  Recommendations may be updated based on patient status, additional functional criteria and insurance authorization.   Follow Up Recommendations  Skilled nursing-short term rehab (<3 hours/day)     Assistance Recommended at Discharge Frequent or constant Supervision/Assistance  Patient can return home with the following A lot of help with bathing/dressing/bathroom;Direct supervision/assist for medications management;A lot of help with walking and/or  transfers;Direct supervision/assist for financial management;Assist for transportation;Assistance with cooking/housework;Help with stairs or ramp for entrance    Functional Status Assessment  Patient has had a recent decline in their functional status and demonstrates the ability to make significant improvements in function in a reasonable and predictable amount of time.  Equipment Recommendations  None recommended by OT       Precautions / Restrictions Precautions Precautions: Fall Restrictions Weight Bearing Restrictions: No Other Position/Activity Restrictions: chronic bilateral feet wounds. wears specialized shoes      Mobility Bed Mobility Overal bed mobility: Needs Assistance Bed Mobility: Supine to Sit     Supine to sit: HOB elevated, Mod assist     General bed mobility comments: Required increased time and effort, as well as cues to reach for bed rail and to shift trunk in order to perform    Transfers Overall transfer level: Needs assistance Equipment used: Rolling walker (2 wheels) Transfers: Sit to/from Stand, Bed to chair/wheelchair/BSC Sit to Stand: Mod assist Stand pivot transfers: Mod assist         General transfer comment: Pt performed 3 sit to stand transfers using a RW, requiring cues to bend knees further prior to standing, as well as to push up with at least 1 upper extremity from the transfer surface. She was unable to lift her BLE off the floor on the first two stands, given weakness and reports of BLE pain. During the 3rd stand, she required min to mod assist with general cues for transfer technique, in order to stand-pivot to the bedside chair using a RW      Balance Overall balance assessment: Needs assistance   Sitting balance-[name] Scale: Good       Standing balance-[name] Scale: Poor  ADL either performed or assessed with clinical judgement   ADL Overall ADL's : Needs assistance/impaired Eating/Feeding:  Independent;Sitting   Grooming: Set up;Sitting           Upper Body Dressing : Minimal assistance;Sitting   Lower Body Dressing: Maximal assistance Lower Body Dressing Details (indicate cue type and reason): She required assistance to don and her socks and shoes seated EOB     Toileting- Clothing Manipulation and Hygiene: Maximal assistance Toileting - Clothing Manipulation Details (indicate cue type and reason): at bedside commode level, based on clinical judgement             Vision   Additional Comments: per her medical chart, she has a history of glaucoma and macular degeneration            Pertinent Vitals/Pain Pain Assessment Pain Assessment: Faces Pain Score: 5  Pain Location: She reported having chronic bilateral feet pain due to sores. She also reported L elbow and BLE pain when standing. Pain Intervention(s): Limited activity within patient's tolerance, Repositioned     Hand Dominance Right   Extremity/Trunk Assessment Upper Extremity Assessment Upper Extremity Assessment: RUE deficits/detail RUE Deficits / Details: Chronic shoulder AROM limitations, otherwise AROM WFL. Functional grip strength   Lower Extremity Assessment Lower Extremity Assessment: Generalized weakness       Communication Communication Communication: HOH   Cognition Arousal/Alertness: Awake/alert Behavior During Therapy: WFL for tasks assessed/performed Overall Cognitive Status: Within Functional Limits for tasks assessed        General Comments: Oriented to person, place, month, and year, able to follow 1-2 step commands consistently                Home Living Family/patient expects to be discharged to:: Private residence Living Arrangements: Children (her daughters take turns staying with her)   Type of Home: House Home Access: Stairs to enter Technical brewer of Steps: 4   Home Layout: One level     Bathroom Shower/Tub: Engineer, site: Handicapped height     Home Equipment: Rollator (4 wheels);BSC/3in1;Tub bench;Cane - single point;Wheelchair - manual, Lift chair, Bed rail           Prior Functioning/Environment Prior Level of Function : Needs assist             Mobility Comments: She ambulated without assistance in the home using a rollator & used a manual wheelchair for community mobility. ADLs Comments: She performed toileting and feeding without assistance, she needed minimal assist for bathing at tub level, and she could perform dresssing except for needing assistance for bra management & donning/doffing her socks and shoes. Her family managed the household chores. The pt does not drive.        OT Problem List: Decreased strength;Decreased range of motion;Decreased activity tolerance;Impaired balance (sitting and/or standing);Decreased knowledge of use of DME or AE;Pain      OT Treatment/Interventions: Self-care/ADL training;Therapeutic exercise;Therapeutic activities;Energy conservation;Patient/family education;DME and/or AE instruction;Balance training    OT Goals(Current goals can be found in the care plan section) Acute Rehab OT Goals Patient Stated Goal: to get better OT Goal Formulation: With patient/family Time For Goal Achievement: 11/06/22 Potential to Achieve Goals: Good ADL Goals Pt Will Perform Grooming: with supervision;standing Pt Will Perform Upper Body Dressing: with set-up;sitting Pt Will Perform Lower Body Dressing: with min assist;sit to/from stand Pt Will Transfer to Toilet: with supervision;ambulating Pt Will Perform Toileting - Clothing Manipulation and hygiene: with supervision;sit to/from stand  OT Frequency: Min  2X/week       AM-PAC OT "6 Clicks" Daily Activity     Outcome Measure Help from another person eating meals?: None Help from another person taking care of personal grooming?: A Little Help from another person toileting, which includes using toliet, bedpan,  or urinal?: A Lot Help from another person bathing (including washing, rinsing, drying)?: A Lot Help from another person to put on and taking off regular upper body clothing?: A Little Help from another person to put on and taking off regular lower body clothing?: A Lot 6 Click Score: 16   End of Session Equipment Utilized During Treatment: Gait belt;Rolling walker (2 wheels) Nurse Communication: Mobility status  Activity Tolerance: Other (comment) (fair tolerance) Patient left: in chair;with call bell/phone within reach;with family/visitor present  OT Visit Diagnosis: Unsteadiness on feet (R26.81);Muscle weakness (generalized) (M62.81)                Time: 1610-9604 OT Time Calculation (min): 27 min Charges:  OT General Charges $OT Visit: 1 Visit OT Evaluation $OT Eval Moderate Complexity: 1 Mod OT Treatments $Therapeutic Activity: 8-22 mins    Reuben Likes, OTR/L 10/23/2022, 1:00 PM

## 2022-10-24 ENCOUNTER — Inpatient Hospital Stay (HOSPITAL_COMMUNITY): Payer: Medicare Other

## 2022-10-24 DIAGNOSIS — N183 Chronic kidney disease, stage 3 unspecified: Secondary | ICD-10-CM | POA: Diagnosis not present

## 2022-10-24 DIAGNOSIS — I10 Essential (primary) hypertension: Secondary | ICD-10-CM | POA: Diagnosis not present

## 2022-10-24 DIAGNOSIS — I5033 Acute on chronic diastolic (congestive) heart failure: Secondary | ICD-10-CM | POA: Diagnosis not present

## 2022-10-24 DIAGNOSIS — K219 Gastro-esophageal reflux disease without esophagitis: Secondary | ICD-10-CM | POA: Diagnosis not present

## 2022-10-24 LAB — CBC WITH DIFFERENTIAL/PLATELET
Abs Immature Granulocytes: 0.12 10*3/uL — ABNORMAL HIGH (ref 0.00–0.07)
Basophils Absolute: 0 10*3/uL (ref 0.0–0.1)
Basophils Relative: 0 %
Eosinophils Absolute: 0.1 10*3/uL (ref 0.0–0.5)
Eosinophils Relative: 1 %
HCT: 30.2 % — ABNORMAL LOW (ref 36.0–46.0)
Hemoglobin: 9.2 g/dL — ABNORMAL LOW (ref 12.0–15.0)
Immature Granulocytes: 1 %
Lymphocytes Relative: 12 %
Lymphs Abs: 1.5 10*3/uL (ref 0.7–4.0)
MCH: 27.1 pg (ref 26.0–34.0)
MCHC: 30.5 g/dL (ref 30.0–36.0)
MCV: 88.8 fL (ref 80.0–100.0)
Monocytes Absolute: 1.1 10*3/uL — ABNORMAL HIGH (ref 0.1–1.0)
Monocytes Relative: 9 %
Neutro Abs: 9.6 10*3/uL — ABNORMAL HIGH (ref 1.7–7.7)
Neutrophils Relative %: 77 %
Platelets: 173 10*3/uL (ref 150–400)
RBC: 3.4 MIL/uL — ABNORMAL LOW (ref 3.87–5.11)
RDW: 16.2 % — ABNORMAL HIGH (ref 11.5–15.5)
WBC: 12.3 10*3/uL — ABNORMAL HIGH (ref 4.0–10.5)
nRBC: 0 % (ref 0.0–0.2)

## 2022-10-24 LAB — COMPREHENSIVE METABOLIC PANEL
ALT: 17 U/L (ref 0–44)
AST: 20 U/L (ref 15–41)
Albumin: 2.5 g/dL — ABNORMAL LOW (ref 3.5–5.0)
Alkaline Phosphatase: 77 U/L (ref 38–126)
Anion gap: 9 (ref 5–15)
BUN: 58 mg/dL — ABNORMAL HIGH (ref 8–23)
CO2: 26 mmol/L (ref 22–32)
Calcium: 9 mg/dL (ref 8.9–10.3)
Chloride: 102 mmol/L (ref 98–111)
Creatinine, Ser: 1.74 mg/dL — ABNORMAL HIGH (ref 0.44–1.00)
GFR, Estimated: 26 mL/min — ABNORMAL LOW (ref >60)
Glucose, Bld: 104 mg/dL — ABNORMAL HIGH (ref 70–99)
Potassium: 5.1 mmol/L (ref 3.5–5.1)
Sodium: 137 mmol/L (ref 135–145)
Total Bilirubin: 0.7 mg/dL (ref 0.3–1.2)
Total Protein: 5.5 g/dL — ABNORMAL LOW (ref 6.5–8.1)

## 2022-10-24 LAB — PHOSPHORUS: Phosphorus: 4.7 mg/dL — ABNORMAL HIGH (ref 2.5–4.6)

## 2022-10-24 LAB — MAGNESIUM: Magnesium: 1.8 mg/dL (ref 1.7–2.4)

## 2022-10-24 MED ORDER — FUROSEMIDE 10 MG/ML IJ SOLN
20.0000 mg | Freq: Once | INTRAMUSCULAR | Status: AC
  Administered 2022-10-24: 20 mg via INTRAVENOUS
  Filled 2022-10-24: qty 2

## 2022-10-24 NOTE — Progress Notes (Signed)
Occupational Therapy Treatment Patient Details Name: [name] MRN: [id number] DOB: 01-Jan-1923 Today's Date: 10/24/2022   History of present illness Patient is a 89 yr old female admitted to the hospital with dyspnea and BLE edema. She was found to have acute on chronic diastolic CHF, hypothermia, and possible UTI. PMH. CAD, NSTEMI, anemia, OA, CVA, glaucoma, macular degeneration, gout, HTN   OT comments  Patient was seen per MD order today. Patient was evaluated on 1/14 for PT and OT and is currently at the same level making this session a treat v.s. re evaluation. Patient noted to have increased pain in BLE with reports that they feel like they are in spasms. Patient was able to still participate in sitting EOB to brush teeth with min guard and attempted standing with HR reaching 130 bpm with increased pain in bilateral knees R more than L.Patients daughter in room educated that if patient wants to get up nursing was also able to assist patient. Patients daughter verbalized understanding. Patient's discharge plan remains appropriate at this time. OT will continue to follow acutely.     Recommendations for follow up therapy are one component of a multi-disciplinary discharge planning process, led by the attending physician.  Recommendations may be updated based on patient status, additional functional criteria and insurance authorization.    Follow Up Recommendations  Skilled nursing-short term rehab (<3 hours/day)     Assistance Recommended at Discharge Frequent or constant Supervision/Assistance  Patient can return home with the following  A lot of help with bathing/dressing/bathroom;Direct supervision/assist for medications management;A lot of help with walking and/or transfers;Direct supervision/assist for financial management;Assist for transportation;Assistance with cooking/housework;Help with stairs or ramp for entrance   Equipment Recommendations  None recommended by OT     Recommendations for Other Services      Precautions / Restrictions Precautions Precautions: Fall Restrictions Weight Bearing Restrictions: No Other Position/Activity Restrictions: chronic bilateral feet wounds. wears specialized shoes       Mobility Bed Mobility Overal bed mobility: Needs Assistance Bed Mobility: Supine to Sit, Sit to Supine     Supine to sit: HOB elevated, Mod assist Sit to supine: Max assist   General bed mobility comments: to advance BLE to EOB and get back into bed.           ADL either performed or assessed with clinical judgement   ADL Overall ADL's : Needs assistance/impaired     Grooming: Set up;Sitting;Wash/dry face;Oral care;Wash/dry hands Grooming Details (indicate cue type and reason): EOB with increaed time. HR noted to be in 120s sitting EOB       Toilet Transfer Details (indicate cue type and reason): patient attempted standing with reports that Bilateral knees were too painful and returned to supine with HR 130bpm. nurse made aware. daughter present during session.                  Cognition Arousal/Alertness: Awake/alert Behavior During Therapy: WFL for tasks assessed/performed Overall Cognitive Status: Within Functional Limits for tasks assessed           General Comments: HOH, daughter was present in room, able to follow commands.                   Pertinent Vitals/ Pain       Pain Assessment Pain Assessment: Faces Faces Pain Scale: Hurts little more Pain Location: bilateral knees R more than L, Pain Descriptors / Indicators: Discomfort, Sore Pain Intervention(s): Limited activity within patient's tolerance, Monitored during session,  Premedicated before session   Frequency  Min 2X/week        Progress Toward Goals  OT Goals(current goals can now be found in the care plan section)  Progress towards OT goals: Progressing toward goals     Plan Discharge plan remains appropriate       AM-PAC OT  "6 Clicks" Daily Activity     Outcome Measure   Help from another person eating meals?: None Help from another person taking care of personal grooming?: A Little Help from another person toileting, which includes using toliet, bedpan, or urinal?: A Lot Help from another person bathing (including washing, rinsing, drying)?: A Lot Help from another person to put on and taking off regular upper body clothing?: A Little Help from another person to put on and taking off regular lower body clothing?: A Lot 6 Click Score: 16    End of Session Equipment Utilized During Treatment: Rolling walker (2 wheels)  OT Visit Diagnosis: Unsteadiness on feet (R26.81);Muscle weakness (generalized) (M62.81)   Activity Tolerance Patient limited by pain   Patient Left in bed;with call bell/phone within reach;with family/visitor present;with nursing/sitter in room   Nurse Communication Mobility status        Time: 2505-3976 OT Time Calculation (min): 27 min  Charges: OT General Charges $OT Visit: 1 Visit OT Treatments $Self Care/Home Management : 8-22 mins $Therapeutic Activity: 8-22 mins  [name], MS Acute Rehabilitation Department Office# [phone]   [name] 10/24/2022, 4:10 PM

## 2022-10-24 NOTE — Progress Notes (Signed)
PROGRESS NOTE    [name]  VZD:[id number] DOB: August 30, 1923 DOA: 10/20/2022 PCP: Irena Reichmann, DO   Brief Narrative:  HPI per Dr. Sanda Klein on 10/21/21 [name] is a 89 y.o. 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   No chest pain, palpitations, diaphoresis, PND or orthopnea No fever, chills or night sweats. No sore throat, rhinorrhea, dyspnea, wheezing or hemoptysis  No appetite changes, abdominal pain, diarrhea, constipation, melena or hematochezia.  No flank pain, dysuria, frequency or hematuria.  No polyuria, polydipsia, polyphagia or blurred vision.     The patient was hypothermic on arrival with a temperature of 91.1 F.  She was transferred to the stepdown unit.  Urine analysis showed pyuria, but no bacteriuria.  Given hypothermia and history of ESBL UTI, urine culture and sensitivity order followed by meropenem 500 mg twice a day IVPB.   ED course: Initial vital signs were temperature 97.6 F, pulse 54, respirations 16, BP 131/95 mmHg O2 sat 100% on room air.  The patient is now on nasal cannula oxygen at 2 LPM most recent O2 sat is 100%.  She received furosemide 60 mg IVP and her home meds were ordered in the ED including metoprolol.   Lab work: Coronavirus, RSV and influenza PCR was negative.  CBC is her white count 9.2, hemoglobin 9.4 g/dL platelets 295.  Troponin x 2 normal.  BNP 580.4 pg/mL.  CMP showed a potassium of 5.4 mmol/L, glucose 111, BUN 63 and creatinine 1.51 mg/dL.  The rest of the CMP measurements were normal.  Baseline creatinine in the last 2 years usually ranges between 1.2 and 1.6 mg/deciliter.   Imaging:  Portable 1 view chest radiograph with a new right hilar/perihilar masslike density measuring 4.0 x 4.2 cm concerning for malignancy.  There is a new small right pleural effusion.  CT chest with contrast with marked enlargement of the main pulmonary artery and right pulmonary artery compatible with pulmonary hypertension.  No hilar mass identified.  Small bilateral pleural effusions with bibasilar compressive atelectasis.  Central peribronchial wall thickening that may be infectious or inflammatory.  Cardiomegaly.  1 cm incidental left thyroid nodule with no follow-up recommended.   **Interim History Patient has continued to be diuresed given her volume overload.  She requires oxygen and will need to be weaned but she is improving.  PT OT to evaluate and PT OT recommending home health.  Renal function elevated in the setting of diuresis but will hold IV Lasix dose today given the slight bump in renal function.   Assessment and Plan:  Acute Respiratory Failure with Hypoxia in the setting of Acute on Chronic Diastolic CHF (congestive heart failure) (HCC) -Observation/SDU will change to inpatient telemetry -Supplemental oxygen as needed. -Sodium and fluid restriction. -Hold Furosemide Today  -SpO2: 95 % O2 Flow Rate (L/min): 2 L/min -Monitor daily weights, intake and output.;  -Patient is -5.039 Liters  -Monitor renal function electrolytes. -Check Echocardiogram and showed "Small mid cavitary gradient . Left ventricular ejection fraction, by estimation, is 60 to 65%. The left ventricle has normal function. The left ventricle has no regional wall motion abnormalities. There is moderate left ventricular hypertrophy. Left ventricular diastolic parameters are consistent with Grade II diastolic  dysfunction (pseudonormalization). Elevated left ventricular end-diastolic pressure." -DG Chest X-Ray done today and showed "The heart is normal in size. Stable prominent right hilum due to right pulmonary artery  prominence. Underlying emphysematous changes and pulmonary scarring. Persistent small bilateral pleural effusions and bibasilar atelectasis." -On IV Meropenem  -Hold beta-blocker due to bradycardia/CHF exacerbation. -Continue to monitor for signs and symptoms of volume overload and repeat chest x-ray in a.m. -Will continue Xopenex/Atrovent, Flutter Valve, and Incentive Spirometry  -PT and OT to further evaluate and treat and PT recommending Home Health    Hypothermia, improved  -Warming measures. -TSH normal. -CXR with cardiomegaly and edema. -Urinalysis with pyuria. -Urine culture and sensitivity ordered. -Transferred to stepdown. -History of ESBL infection so meropenem started and family confirms that she was testing positive for a UTI -Being treated with Meropenem  Leukocytosis -Worsening but ? Reactive -WBC Trend: Recent Labs  Lab 10/20/22 1658 10/21/22 1455 10/22/22 0301 10/23/22 0442 10/24/22 0443  WBC 9.2 6.5 9.6 10.2 12.3*  -No S/Sx of Infection as she denies any Respiratory Symptoms such as cough or urinary Symptoms -On Meropenem -Continue to Monitor and and Trend and Repeat CBC in the AM    Hx of ESBL UTI but now showing Klebsiella Pneumoniae  -Urinalysis showed clear appearance with moderate leukocytes, negative nitrites, no bacteria seen, greater than 50 WBCs but urine culture showing greater than 100,000 colony-forming units of gram-negative rods and turned out to be Klebsiella  Pneumoniae -WBC Trend: Recent Labs  Lab 10/20/22 1658 10/21/22 1455 10/22/22 0301 10/23/22 0442 10/24/22 0443  WBC 9.2 6.5 9.6 10.2 12.3*  -Cx Sensitivities:         Klebsiella pneumoniae      MIC      AMPICILLIN >=32 RESIST... Resistant      AMPICILLIN/SULBACTAM 16 INTERMED... Intermediate      CEFAZOLIN <=4 SENSITIVE Sensitive      CEFEPIME <=0.12 SENS... Sensitive      CEFTRIAXONE <=0.25 SENS... Sensitive      CIPROFLOXACIN <=0.25 SENS... Sensitive      GENTAMICIN <=1  SENSITIVE Sensitive      IMIPENEM <=0.25 SENS... Sensitive      NITROFURANTOIN 128 RESISTANT Resistant      PIP/TAZO <=4 SENSITIVE Sensitive      TRIMETH/SULFA >=320 RESIS... Resistant    -Continuing IV meropenem but can de-escalate likely     Primary pulmonary hypertension (HCC) -Check echocardiogram and showed "There is severely elevated pulmonary artery systolic pressure. The tricuspid regurgitant velocity is  3.43 m/s, and with an assumed right atrial pressure of 15 mmHg, the estimated right ventricular systolic pressure is [id number] mmHg " -CT Scan of the Chest w/ Contrast done and showed "Marked enlargement of the main pulmonary artery and right pulmonary artery compatible with pulmonary arterial hypertension. No hilar mass identified. Small bilateral pleural effusions with bibasilar compressive atelectasis. Central peribronchial wall thickening may be infectious/inflammatory. Cardiomegaly. 1 cm incidental left thyroid nodule. No follow-up imaging is recommended." -IV diuresis was held yesterday but resumed today at IV 20 mg x1   Essential Hypertension -Continue amlodipine 2.5 mg p.o. daily. -Hold metoprolol due to bradycardia acute CHF. -Held Lasix yesterday but will give another dose of IV Lasix 20 mg x1 today  -Continue to Monitor BP per Protocol; Last BP reading is now 124/51   CKD (chronic kidney disease), stage IIIb (HCC) -BUN/Cr Trend: Recent Labs  Lab 10/20/22 1658 10/21/22 1455 10/22/22 0301 10/23/22 0442 10/24/22 0443  BUN 63* 61* 55* 60* 58*  CREATININE 1.51*  1.49* 1.51* 1.94* 1.74*   -Avoid Nephrotoxic Medications, Contrast Dyes, Hypotension and Dehydration to Ensure Adequate Renal Perfusion and will need to Renally Adjust Meds -Continue to Monitor and Trend Renal Function carefully and repeat CMP in the AM    Hyperlipidemia -Continue Atorvastatin 80 mg p.o. daily.   Gastroesophageal reflux disease -Continue famotidine 20 mg p.o. daily.   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 -PT/OT to Evaluate and Treat OT recommending SNF but PT recommending home health but will have them re-evaluate given that she feels weaker   Iron Deficiency Anemia -Hgb/Hct Trend: Recent Labs  Lab 10/20/22 1658 10/21/22 1455 10/22/22 0301 10/23/22 0442 10/24/22 0443  HGB 9.4* 9.1* 9.1* 8.9* 9.2*  HCT 32.2* 31.4* 30.2* 29.5* 30.2*  MCV 91.0 92.6 89.3 89.7 88.8  -Check Anemia Panel in the Am -Continue to Monitor for S/Sx of Bleeding; No overt bleeding noted -Repeat CBC in the AM    Glaucoma -Continue current eyedrops, except for timolol.   Hyperkalemia -K+ Trend: Recent Labs  Lab 10/20/22 1658 10/21/22 1455 10/22/22 0301 10/23/22 0442 10/24/22 0443  K 5.4* 5.2* 5.8* 5.1 5.1  -Given Lasix and Lokelma with improvement; held Lasix dose yesterday but will resume again today and give a dose of IV 20 mg today -Continue to Monitor and Trend   Hypoalbuminemia -Patient's Albumin Level Trend: Recent Labs  Lab 10/20/22 1658 10/22/22 0301 10/23/22 0442 10/24/22 0443  ALBUMIN 3.5 2.6* 2.5* 2.5*  -Continue to Monitor and Trend and repeat CMP in the AM    Venous Insufficiency and Neuropathic Ulcerations -WOC Nurse consulted -C/w Louretta Parma Boots and to be changed   DVT prophylaxis: SCDs Start: 10/21/22 1423    Code Status: DNR Family Communication: Discussed with family present at bedside   Disposition Plan:  Level of care: Telemetry Status is: Inpatient Remains inpatient appropriate because: Getting diuresis today and anticipate discharge in the next 24 to 48 hours   Consultants:  None  Procedures:  As delineated as above  Antimicrobials:  Anti-infectives (From admission, onward)    Start     Dose/Rate Route Frequency Ordered Stop   10/21/22 2000  cefTRIAXone (ROCEPHIN) 1 g in sodium chloride 0.9 % 100 mL IVPB  Status:  Discontinued        1 g 200 mL/hr over 30 Minutes Intravenous Every 24  hours 10/21/22 1859 10/21/22 1906   10/21/22 1915  meropenem (MERREM) 1,000 mg in sodium chloride 0.9 % 100 mL IVPB       Note to Pharmacy: Adjust dose as needed.  History of ESBL UTIs presenting with hypothermia and pyuria on urine analysis.   1,000 mg 200 mL/hr over 30 Minutes Intravenous Every 12 hours 10/21/22 1906         Subjective: Seen and examined at bedside and she is doing okay denies any respiratory distress or burning or discomfort in her urine.  No complaints but felt weak.  No other concerns or complaints at this time and is to have her Unna boots changed today.  Objective: Vitals:   10/23/22 2128 10/24/22 0445 10/24/22 0456 10/24/22 1203  BP: (!) 149/61 (!) 161/68  (!) 113/55  Pulse: (!) 109 (!) 110  (!) 107  Resp: 20 18  19   Temp: 97.8 F (36.6 C) 98.6 F (37 C)  98.7 F (37.1 C)  TempSrc: Oral Oral  Oral  SpO2: 93% 94%  93%  Weight:   63.8 kg   Height:  Intake/Output Summary (Last 24 hours) at 10/24/2022 1537 Last data filed at 10/24/2022 1203 Gross per 24 hour  Intake 120 ml  Output 550 ml  Net -430 ml   Filed Weights   10/22/22 0430 10/23/22 0500 10/24/22 0456  Weight: 64.4 kg 63 kg 63.8 kg   Examination: Physical Exam:  Constitutional: Elderly Caucasian female currently no acute distress Respiratory: Diminished to auscultation bilaterally with coarse breath sounds, no wheezing, rales, rhonchi or crackles. Normal respiratory effort and patient is not tachypenic. No accessory muscle use.  Unlabored breathing and not wearing any supplemental oxygen via nasal cannula Cardiovascular: RRR, no murmurs / rubs / gallops. S1 and S2 auscultated.  Mild 1+ extremity edema Abdomen: Soft, non-tender, non-distended. Bowel sounds positive.  GU: Deferred. Musculoskeletal: No clubbing / cyanosis of digits/nails. No joint deformity upper and lower extremities. Skin: No rashes, lesions, ulcers on limited skin evaluation has bilateral lower extremities that are  wrapped and pneumoboots. No induration; Warm and dry.  Neurologic: CN 2-12 grossly intact with no focal deficits. Romberg sign cerebellar reflexes not assessed.  Psychiatric: Normal judgment and insight. Alert and oriented x 2. Normal mood and appropriate affect.   Data Reviewed: I have personally reviewed following labs and imaging studies  CBC: Recent Labs  Lab 10/20/22 1658 10/21/22 1455 10/22/22 0301 10/23/22 0442 10/24/22 0443  WBC 9.2 6.5 9.6 10.2 12.3*  NEUTROABS 7.0  --   --  7.6 9.6*  HGB 9.4* 9.1* 9.1* 8.9* 9.2*  HCT 32.2* 31.4* 30.2* 29.5* 30.2*  MCV 91.0 92.6 89.3 89.7 88.8  PLT 255 212 177 198 417   Basic Metabolic Panel: Recent Labs  Lab 10/20/22 1658 10/21/22 1455 10/22/22 0301 10/23/22 0442 10/24/22 0443  NA 140 140 142 140 137  K 5.4* 5.2* 5.8* 5.1 5.1  CL 110 108 108 103 102  CO2 22 25 23 26 26   GLUCOSE 111* 94 64* 95 104*  BUN 63* 61* 55* 60* 58*  CREATININE 1.51* 1.49* 1.51* 1.94* 1.74*  CALCIUM 9.4 9.3 9.5 9.2 9.0  MG  --  2.1  --  1.8 1.8  PHOS  --  5.7*  --  4.8* 4.7*   GFR: Estimated Creatinine Clearance: 15.4 mL/min (A) (by C-G formula based on SCr of 1.74 mg/dL (H)). Liver Function Tests: Recent Labs  Lab 10/20/22 1658 10/22/22 0301 10/23/22 0442 10/24/22 0443  AST 26 27 20 20   ALT 23 25 20 17   ALKPHOS 110 91 89 77  BILITOT 0.3 0.7 0.4 0.7  PROT 6.5 5.5* 5.9* 5.5*  ALBUMIN 3.5 2.6* 2.5* 2.5*   No results for input(s): "LIPASE", "AMYLASE" in the last 168 hours. No results for input(s): "AMMONIA" in the last 168 hours. Coagulation Profile: No results for input(s): "INR", "PROTIME" in the last 168 hours. Cardiac Enzymes: No results for input(s): "CKTOTAL", "CKMB", "CKMBINDEX", "TROPONINI" in the last 168 hours. BNP (last 3 results) No results for input(s): "PROBNP" in the last 8760 hours. HbA1C: No results for input(s): "HGBA1C" in the last 72 hours. CBG: Recent Labs  Lab 10/22/22 0616  GLUCAP 113*   Lipid Profile: No  results for input(s): "CHOL", "HDL", "LDLCALC", "TRIG", "CHOLHDL", "LDLDIRECT" in the last 72 hours. Thyroid Function Tests: No results for input(s): "TSH", "T4TOTAL", "FREET4", "T3FREE", "THYROIDAB" in the last 72 hours. Anemia Panel: No results for input(s): "VITAMINB12", "FOLATE", "FERRITIN", "TIBC", "IRON", "RETICCTPCT" in the last 72 hours. Sepsis Labs: No results for input(s): "PROCALCITON", "LATICACIDVEN" in the last 168 hours.  Recent Results (from the  past 240 hour(s))  Resp panel by RT-PCR (RSV, Flu A&B, Covid) Anterior Nasal Swab     Status: None   Collection Time: 10/20/22  6:55 PM   Specimen: Anterior Nasal Swab  Result Value Ref Range Status   SARS Coronavirus 2 by RT PCR NEGATIVE NEGATIVE Final    Comment: (NOTE) SARS-CoV-2 target nucleic acids are NOT DETECTED.  The SARS-CoV-2 RNA is generally detectable in upper respiratory specimens during the acute phase of infection. The lowest concentration of SARS-CoV-2 viral copies this assay can detect is 138 copies/mL. A negative result does not preclude SARS-Cov-2 infection and should not be used as the sole basis for treatment or other patient management decisions. A negative result may occur with  improper specimen collection/handling, submission of specimen other than nasopharyngeal swab, presence of viral mutation(s) within the areas targeted by this assay, and inadequate number of viral copies(<138 copies/mL). A negative result must be combined with clinical observations, patient history, and epidemiological information. The expected result is Negative.  Fact Sheet for Patients:  EntrepreneurPulse.com.au  Fact Sheet for Healthcare Providers:  IncredibleEmployment.be  This test is no t yet approved or cleared by the [place] FDA and  has been authorized for detection and/or diagnosis of SARS-CoV-2 by FDA under an Emergency Use Authorization (EUA). This EUA will remain  in  effect (meaning this test can be used) for the duration of the COVID-19 declaration under Section 564(b)(1) of the Act, 21 U.S.C.section 360bbb-3(b)(1), unless the authorization is terminated  or revoked sooner.       Influenza A by PCR NEGATIVE NEGATIVE Final   Influenza B by PCR NEGATIVE NEGATIVE Final    Comment: (NOTE) The Xpert Xpress SARS-CoV-2/FLU/RSV plus assay is intended as an aid in the diagnosis of influenza from Nasopharyngeal swab specimens and should not be used as a sole basis for treatment. Nasal washings and aspirates are unacceptable for Xpert Xpress SARS-CoV-2/FLU/RSV testing.  Fact Sheet for Patients: EntrepreneurPulse.com.au  Fact Sheet for Healthcare Providers: IncredibleEmployment.be  This test is not yet approved or cleared by the [place] FDA and has been authorized for detection and/or diagnosis of SARS-CoV-2 by FDA under an Emergency Use Authorization (EUA). This EUA will remain in effect (meaning this test can be used) for the duration of the COVID-19 declaration under Section 564(b)(1) of the Act, 21 U.S.C. section 360bbb-3(b)(1), unless the authorization is terminated or revoked.     Resp Syncytial Virus by PCR NEGATIVE NEGATIVE Final    Comment: (NOTE) Fact Sheet for Patients: EntrepreneurPulse.com.au  Fact Sheet for Healthcare Providers: IncredibleEmployment.be  This test is not yet approved or cleared by the [place] FDA and has been authorized for detection and/or diagnosis of SARS-CoV-2 by FDA under an Emergency Use Authorization (EUA). This EUA will remain in effect (meaning this test can be used) for the duration of the COVID-19 declaration under Section 564(b)(1) of the Act, 21 U.S.C. section 360bbb-3(b)(1), unless the authorization is terminated or revoked.  Performed at [facility], [address], [place], [place] [address]    MRSA Next Gen by PCR, Nasal     Status: None   Collection Time: 10/21/22  6:49 PM   Specimen: Nasal Mucosa; Nasal Swab  Result Value Ref Range Status   MRSA by PCR Next Gen NOT DETECTED NOT DETECTED Final    Comment: (NOTE) The GeneXpert MRSA Assay (FDA approved for NASAL specimens only), is one component of a comprehensive MRSA colonization surveillance program. It is not intended to diagnose  MRSA infection nor to guide or monitor treatment for MRSA infections. Test performance is not FDA approved in patients less than 76 years old. Performed at [facility], [place] [address] [place], [place] [address]   Urine Culture     Status: Abnormal   Collection Time: 10/21/22  9:30 PM   Specimen: Urine, Catheterized  Result Value Ref Range Status   Specimen Description   Final    URINE, CATHETERIZED Performed at [facility] [address] [place], [place] [address]    Special Requests   Final    NONE Performed at [facility], [place] [address], [place], [place] [address]    Culture >=100,000 COLONIES/mL KLEBSIELLA PNEUMONIAE (A)  Final   Report Status 10/23/2022 FINAL  Final   Organism ID, Bacteria KLEBSIELLA PNEUMONIAE (A)  Final      Susceptibility   Klebsiella pneumoniae - MIC*    AMPICILLIN >=32 RESISTANT Resistant     CEFAZOLIN <=4 SENSITIVE Sensitive     CEFEPIME <=0.12 SENSITIVE Sensitive     CEFTRIAXONE <=0.25 SENSITIVE Sensitive     CIPROFLOXACIN <=0.25 SENSITIVE Sensitive     GENTAMICIN <=1 SENSITIVE Sensitive     IMIPENEM <=0.25 SENSITIVE Sensitive     NITROFURANTOIN 128 RESISTANT Resistant     TRIMETH/SULFA >=320 RESISTANT Resistant     AMPICILLIN/SULBACTAM 16 INTERMEDIATE Intermediate     PIP/TAZO <=4 SENSITIVE Sensitive     * >=100,000 COLONIES/mL KLEBSIELLA PNEUMONIAE    Radiology Studies: DG CHEST PORT 1 VIEW  Result Date: 10/24/2022 CLINICAL DATA:  Shortness of breath. EXAM: PORTABLE CHEST 1 VIEW  COMPARISON:  Chest x-ray 10/23/2022 and chest CT 10/20/2022. FINDINGS: The heart is normal in size. Stable prominent right hilum due to right pulmonary artery prominence. Underlying emphysematous changes and pulmonary scarring. Persistent small bilateral pleural effusions and bibasilar atelectasis. IMPRESSION: Persistent small bilateral pleural effusions and bibasilar atelectasis. Electronically Signed   By: Marijo Sanes M.D.   On: 10/24/2022 08:19   DG CHEST PORT 1 VIEW  Result Date: 10/23/2022 CLINICAL DATA:  Short of breath EXAM: PORTABLE CHEST 1 VIEW COMPARISON:  Prior chest x-ray 10/21/2022 FINDINGS: Unchanged appearance of the lungs with cardiomegaly, enlarged pulmonary arteries, bilateral layering effusions and bibasilar atelectasis. Chronic bronchitic changes and interstitial prominence are similar. No pneumothorax. No new airspace opacities. No acute osseous abnormality. IMPRESSION: No interval change in the appearance of the chest compared to 10/21/2022. Persistent bilateral layering pleural effusions and associated bibasilar atelectasis versus infiltrate. Electronically Signed   By: Jacqulynn Cadet M.D.   On: 10/23/2022 07:45    Scheduled Meds:  amLODipine  2.5 mg Oral Daily   aspirin  81 mg Oral Daily   atorvastatin  80 mg Oral q1800   brimonidine  1 drop Both Eyes BID   Chlorhexidine Gluconate Cloth  6 each Topical Daily   famotidine  20 mg Oral Daily   ipratropium  0.5 mg Nebulization TID   levalbuterol  0.63 mg Nebulization TID   Loteprednol Etabonate  1 drop Right Eye BID   mirabegron ER  25 mg Oral Daily   Continuous Infusions:  meropenem (MERREM) IV 1,000 mg (10/24/22 0913)    LOS: 2 days   [name], DO [facility] Available via Epic secure chat 7am-7pm After these hours, please refer to coverage provider listed on [email or web address] 10/24/2022, 3:37 PM

## 2022-10-24 NOTE — TOC Initial Note (Addendum)
Transition of Care [facility]) - Initial/Assessment Note    Patient Details  Name: [name] MRN: [id number] Date of Birth: 1922-12-19  Transition of Care [facility]) CM/SW Contact:    Illene Regulus, LCSW Phone Number: 10/24/2022, 12:15 PM  Clinical Narrative:                 CSW spoke with pt's daughter beth in regards to [facility] recommendations. Beth reported pt lives in her own home , where she and her sister switch out every week so pt is not alone. Beth reported pt uses a walker and does not need any DME. Pt's daughter has requested [facility] for [facility] services. CSW sent referral out to [facility] who is reviewing pt's information. TOC to follow.   ADDEN 2:00pm [facility] was able to accept pt for [facility] PT/Ot services. Will need orders MD made aware. TOC will continue to follow for d/c needs   Expected Discharge Plan: [place] Barriers to Discharge: Continued Medical Work up   Patient Goals and CMS Choice Patient states their goals for this hospitalization and ongoing recovery are:: retrun home with [facility] services CMS Medicare.gov Compare Post Acute Care list provided to:: Patient Represenative (must comment) Choice offered to / list presented to : Adult Children      Expected Discharge Plan and Services In-house Referral: Clinical Social Work     Living arrangements for the past 2 months: Single Family Home                                      Prior Living Arrangements/Services Living arrangements for the past 2 months: Single Family Home Lives with:: Self, Adult Children   Do you feel safe going back to the place where you live?: Yes          Current home services: DME    Activities of Daily Living Home Assistive Devices/Equipment: Cane (specify quad or straight) ADL Screening (condition at time of admission) Patient's cognitive ability adequate to safely complete daily activities?: Yes Is the patient deaf or have difficulty hearing?: Yes Does the  patient have difficulty seeing, even when wearing glasses/contacts?: No Does the patient have difficulty concentrating, remembering, or making decisions?: No Patient able to express need for assistance with ADLs?: Yes Does the patient have difficulty dressing or bathing?: No Independently performs ADLs?: No Communication: Independent Dressing (OT): Needs assistance Is this a change from baseline?: Pre-admission baseline Grooming: Needs assistance Is this a change from baseline?: Pre-admission baseline Feeding: Independent Bathing: Needs assistance Is this a change from baseline?: Pre-admission baseline In/Out Bed: Needs assistance Is this a change from baseline?: Pre-admission baseline Walks in Home: Needs assistance Is this a change from baseline?: Pre-admission baseline Does the patient have difficulty walking or climbing stairs?: No Weakness of Legs: None Weakness of Arms/Hands: Both  Permission Sought/Granted                  Emotional Assessment Appearance:: Appears stated age Attitude/Demeanor/Rapport: Unable to Assess (spoke with daughter) Affect (typically observed): Unable to Assess (spoke with daughter) Orientation: : Oriented to Self, Oriented to Place, Oriented to  Time, Oriented to Situation Alcohol / Substance Use: Not Applicable Psych Involvement: No (comment)  Admission diagnosis:  Hyperkalemia [E87.5] Pleural effusion, right [J90] Acute on chronic diastolic congestive heart failure (HCC) [I50.33] Bilateral lower extremity edema [R60.0] Acute on chronic diastolic CHF (congestive heart failure) ([place]) [  I50.33] Mass of right lung [R91.8] Patient Active Problem List   Diagnosis Date Noted   Hypothermia 10/21/2022   Acute on chronic diastolic CHF (congestive heart failure) ([place]) 10/20/2022   UTI (urinary tract infection) 01/14/2022   Glaucoma 01/14/2022   Macular degeneration 01/14/2022   Cystitis 01/14/2022   Ataxia following cerebral infarction  10/07/2020   Chronic diastolic heart failure ([place]) 10/07/2020   Cough 10/07/2020   Diabetic renal disease ([place]) 10/07/2020   Encounter for general adult medical examination without abnormal findings 10/07/2020   Gastroesophageal reflux disease 10/07/2020   Generalized osteoarthritis 10/07/2020   Gout 10/07/2020   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 ([place]) 10/07/2020   Iron deficiency anemia 10/07/2020   Knee pain 10/07/2020   Osteoporosis 10/07/2020   Primary pulmonary hypertension ([place]) 10/07/2020   Recurrent falls 10/07/2020   Tricuspid valve disorder 10/07/2020   Vitamin B12 deficiency (non anemic) 10/07/2020   Vitamin D deficiency 10/07/2020   Bilateral elbow joint pain 03/17/2020   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, severe stage 01/06/2020   Pain of left hip joint 06/12/2019   Pain in joint of left shoulder 05/03/2018   Osteoarthritis of knee 12/28/2017   Upper airway cough syndrome 07/24/2017   Coronary artery disease 11/12/2015   Hyperlipidemia 11/12/2015   TIA (transient ischemic attack)    NSTEMI (non-ST elevated myocardial infarction) ([place]) 08/18/2015   History of CVA (cerebrovascular accident) 09/26/2012   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 09/26/2012   UTI (lower urinary tract infection) 09/26/2012   Hyperkalemia 09/26/2012   CKD (chronic kidney disease), stage III ([place]) 09/26/2012   Essential hypertension    PCP:  [name], DO Pharmacy:   [id number] - [place], [address] [place] [address] Phone: [phone] Fax: [phone]     Social Determinants of Health (SDOH) Social History: SDOH Screenings   Food Insecurity: No Food Insecurity (10/21/2022)  Housing: Low Risk  (10/21/2022)  Transportation Needs: No Transportation Needs (10/21/2022)  Utilities: Not At Risk (10/21/2022)  Tobacco Use: Low Risk  (10/20/2022)   SDOH  Interventions:     Readmission Risk Interventions    01/17/2022    3:24 PM  Readmission Risk Prevention Plan  Transportation Screening Complete  PCP or Specialist Appt within 3-5 Days Complete  Palliative Care Screening Not Applicable

## 2022-10-25 ENCOUNTER — Inpatient Hospital Stay (HOSPITAL_COMMUNITY): Payer: Medicare Other

## 2022-10-25 DIAGNOSIS — I10 Essential (primary) hypertension: Secondary | ICD-10-CM | POA: Diagnosis not present

## 2022-10-25 DIAGNOSIS — M25561 Pain in right knee: Secondary | ICD-10-CM

## 2022-10-25 DIAGNOSIS — K219 Gastro-esophageal reflux disease without esophagitis: Secondary | ICD-10-CM | POA: Diagnosis not present

## 2022-10-25 DIAGNOSIS — I5033 Acute on chronic diastolic (congestive) heart failure: Secondary | ICD-10-CM | POA: Diagnosis not present

## 2022-10-25 DIAGNOSIS — N183 Chronic kidney disease, stage 3 unspecified: Secondary | ICD-10-CM | POA: Diagnosis not present

## 2022-10-25 LAB — CBC WITH DIFFERENTIAL/PLATELET
Abs Immature Granulocytes: 0.14 10*3/uL — ABNORMAL HIGH (ref 0.00–0.07)
Basophils Absolute: 0 10*3/uL (ref 0.0–0.1)
Basophils Relative: 0 %
Eosinophils Absolute: 0.1 10*3/uL (ref 0.0–0.5)
Eosinophils Relative: 1 %
HCT: 28.6 % — ABNORMAL LOW (ref 36.0–46.0)
Hemoglobin: 8.7 g/dL — ABNORMAL LOW (ref 12.0–15.0)
Immature Granulocytes: 1 %
Lymphocytes Relative: 12 %
Lymphs Abs: 1.4 10*3/uL (ref 0.7–4.0)
MCH: 26.9 pg (ref 26.0–34.0)
MCHC: 30.4 g/dL (ref 30.0–36.0)
MCV: 88.5 fL (ref 80.0–100.0)
Monocytes Absolute: 1.1 10*3/uL — ABNORMAL HIGH (ref 0.1–1.0)
Monocytes Relative: 9 %
Neutro Abs: 9.5 10*3/uL — ABNORMAL HIGH (ref 1.7–7.7)
Neutrophils Relative %: 77 %
Platelets: 170 10*3/uL (ref 150–400)
RBC: 3.23 MIL/uL — ABNORMAL LOW (ref 3.87–5.11)
RDW: 16.1 % — ABNORMAL HIGH (ref 11.5–15.5)
WBC: 12.3 10*3/uL — ABNORMAL HIGH (ref 4.0–10.5)
nRBC: 0 % (ref 0.0–0.2)

## 2022-10-25 LAB — COMPREHENSIVE METABOLIC PANEL
ALT: 13 U/L (ref 0–44)
AST: 18 U/L (ref 15–41)
Albumin: 2.1 g/dL — ABNORMAL LOW (ref 3.5–5.0)
Alkaline Phosphatase: 77 U/L (ref 38–126)
Anion gap: 7 (ref 5–15)
BUN: 59 mg/dL — ABNORMAL HIGH (ref 8–23)
CO2: 26 mmol/L (ref 22–32)
Calcium: 8.7 mg/dL — ABNORMAL LOW (ref 8.9–10.3)
Chloride: 105 mmol/L (ref 98–111)
Creatinine, Ser: 1.95 mg/dL — ABNORMAL HIGH (ref 0.44–1.00)
GFR, Estimated: 23 mL/min — ABNORMAL LOW (ref >60)
Glucose, Bld: 113 mg/dL — ABNORMAL HIGH (ref 70–99)
Potassium: 4.7 mmol/L (ref 3.5–5.1)
Sodium: 138 mmol/L (ref 135–145)
Total Bilirubin: 0.6 mg/dL (ref 0.3–1.2)
Total Protein: 5.1 g/dL — ABNORMAL LOW (ref 6.5–8.1)

## 2022-10-25 LAB — IRON AND TIBC
Iron: 36 ug/dL (ref 28–170)
Saturation Ratios: 12 % (ref 10.4–31.8)
TIBC: 305 ug/dL (ref 250–450)
UIBC: 269 ug/dL

## 2022-10-25 LAB — RETICULOCYTES
Immature Retic Fract: 26.4 % — ABNORMAL HIGH (ref 2.3–15.9)
RBC.: 3.22 MIL/uL — ABNORMAL LOW (ref 3.87–5.11)
Retic Count, Absolute: 65 10*3/uL (ref 19.0–186.0)
Retic Ct Pct: 2 % (ref 0.4–3.1)

## 2022-10-25 LAB — VITAMIN B12: Vitamin B-12: 357 pg/mL (ref 180–914)

## 2022-10-25 LAB — MAGNESIUM: Magnesium: 1.7 mg/dL (ref 1.7–2.4)

## 2022-10-25 LAB — FOLATE: Folate: 6.7 ng/mL (ref >5.9)

## 2022-10-25 LAB — FERRITIN: Ferritin: 37 ng/mL (ref 11–307)

## 2022-10-25 LAB — PHOSPHORUS: Phosphorus: 4.4 mg/dL (ref 2.5–4.6)

## 2022-10-25 MED ORDER — DICLOFENAC SODIUM 1 % EX GEL
2.0000 g | Freq: Four times a day (QID) | CUTANEOUS | Status: DC
  Administered 2022-10-25 – 2022-10-31 (×23): 2 g via TOPICAL
  Filled 2022-10-25: qty 100
  Filled 2022-10-25: qty 50

## 2022-10-25 MED ORDER — CEPHALEXIN 500 MG PO CAPS
500.0000 mg | ORAL_CAPSULE | Freq: Two times a day (BID) | ORAL | Status: DC
  Administered 2022-10-25 – 2022-10-30 (×12): 500 mg via ORAL
  Filled 2022-10-25 (×12): qty 1

## 2022-10-25 MED ORDER — MAGNESIUM SULFATE 2 GM/50ML IV SOLN
2.0000 g | Freq: Once | INTRAVENOUS | Status: AC
  Administered 2022-10-25: 2 g via INTRAVENOUS
  Filled 2022-10-25: qty 50

## 2022-10-25 NOTE — Progress Notes (Signed)
Physical Therapy Treatment Patient Details Name: [name] MRN: [id number] DOB: 01-12-1923 Today's Date: 10/25/2022   History of Present Illness Patient is a 89 yr old female admitted to the hospital with dyspnea and BLE edema. She was found to have acute on chronic diastolic CHF, hypothermia, and possible UTI. PMH. CAD, NSTEMI, anemia, OA, CVA, glaucoma, macular degeneration, gout, HTN    PT Comments    Pt agreeable to trying to mobilize. She reports L elbow and R knee pain with activity/WBing on today. Multiple attempts for pt to be able to stand and step over to the recliner. Daughter was present to observe during session-discussed d/c plan again since MD rounded and stated pt is nearing discharge. Daughter stated they may have to consider ST SNF if pt is not able to ambulate. Applied ice to L elbow and R knee. Pt's daughter reports patient gets steroid injections for R knee-she discussed this with MD during visit. Will continue to follow and progress activity as tolerated. D/C plan depends on pt's ability to mobilize and family decision. Recommend 24/7 care and HHPT,if pt does d/c home.     Recommendations for follow up therapy are one component of a multi-disciplinary discharge planning process, led by the attending physician.  Recommendations may be updated based on patient status, additional functional criteria and insurance authorization.  Follow Up Recommendations  Skilled nursing-short term rehab (<3 hours/day) (family considering ST rehab but still undecided) Can patient physically be transported by private vehicle: No   Assistance Recommended at Discharge Frequent or constant Supervision/Assistance  Patient can return home with the following A lot of help with walking and/or transfers;A lot of help with bathing/dressing/bathroom;Assistance with cooking/housework;Assist for transportation;Help with stairs or ramp for entrance   Equipment Recommendations  None recommended by PT     Recommendations for Other Services OT consult     Precautions / Restrictions Precautions Precautions: Fall Restrictions Weight Bearing Restrictions: No Other Position/Activity Restrictions: chronic bilateral feet wounds. wears specialized shoes     Mobility  Bed Mobility Overal bed mobility: Needs Assistance Bed Mobility: Supine to Sit     Supine to sit: Mod assist, HOB elevated     General bed mobility comments: Assist to get to EOB with use of bedpad for positioning.    Transfers   Equipment used: Rolling walker (2 wheels) Transfers: Sit to/from Stand Sit to Stand: Mod assist, From elevated surface   Step pivot transfers: Mod assist       General transfer comment: Multiple attempts for pt to be able to stand long enough to pivot to recliner. Pt reports L elbow and R knee pain with activity/WBing. Cues for safety, technique, feet placement.    Ambulation/Gait               General Gait Details: Unable to ambulate on today 2* pain.   Stairs             Wheelchair Mobility    Modified Rankin (Stroke Patients Only)       Balance Overall balance assessment: Needs assistance         Standing balance support: Bilateral upper extremity supported, Reliant on assistive device for balance, During functional activity Standing balance-Leahy Scale: Poor                              Cognition Arousal/Alertness: Awake/alert Behavior During Therapy: WFL for tasks assessed/performed Overall Cognitive Status: Within Functional Limits for  tasks assessed                                          Exercises Total Joint Exercises Heel Slides: AAROM, Right, 5 reps, Supine Long Arc Quad: AROM, Left, 5 reps, Seated Goniometric ROM: R knee ROM limited by pain    General Comments        Pertinent Vitals/Pain Pain Assessment Pain Assessment: Faces Faces Pain Scale: Hurts even more Pain Location: R knee, L elbow Pain  Descriptors / Indicators: Grimacing, Guarding Pain Intervention(s): Limited activity within patient's tolerance, Monitored during session, Ice applied, Repositioned    Home Living                          Prior Function            PT Goals (current goals can now be found in the care plan section) Progress towards PT goals: Progressing toward goals (limited by pain on today)    Frequency    Min 3X/week      PT Plan Current plan remains appropriate    Co-evaluation              AM-PAC PT "6 Clicks" Mobility   Outcome Measure  Help needed turning from your back to your side while in a flat bed without using bedrails?: A Lot Help needed moving from lying on your back to sitting on the side of a flat bed without using bedrails?: A Lot Help needed moving to and from a bed to a chair (including a wheelchair)?: A Lot Help needed standing up from a chair using your arms (e.g., wheelchair or bedside chair)?: A Lot Help needed to walk in hospital room?: A Lot Help needed climbing 3-5 steps with a railing? : Total 6 Click Score: 11    End of Session Equipment Utilized During Treatment: Gait belt Activity Tolerance: Patient limited by pain;Patient limited by fatigue Patient left: in chair;with call bell/phone within reach;with family/visitor present   PT Visit Diagnosis: Muscle weakness (generalized) (M62.81);Difficulty in walking, not elsewhere classified (R26.2)     Time: 5102-5852 PT Time Calculation (min) (ACUTE ONLY): 53 min  Charges:  $Therapeutic Activity: 53-67 mins                         Doreatha Massed, PT Acute Rehabilitation  Office: [phone]

## 2022-10-25 NOTE — Progress Notes (Signed)
PROGRESS NOTE    [name]  VOZ:[id number] DOB: Nov 19, 1922 DOA: 10/20/2022 PCP: [name], DO   Brief Narrative:  HPI per Dr. Tennis Must on 10/21/21 [name] is a 89 y.o. 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   No chest pain, palpitations, diaphoresis, PND or orthopnea No fever, chills or night sweats. No sore throat, rhinorrhea, dyspnea, wheezing or hemoptysis  No appetite changes, abdominal pain, diarrhea, constipation, melena or hematochezia.  No flank pain, dysuria, frequency or hematuria.  No polyuria, polydipsia, polyphagia or blurred vision.     The patient was hypothermic on arrival with a temperature of 91.1 F.  She was transferred to the stepdown unit.  Urine analysis showed pyuria, but no bacteriuria.  Given hypothermia and history of ESBL UTI, urine culture and sensitivity order followed by meropenem 500 mg twice a day IVPB.   ED course: Initial vital signs were temperature 97.6 F, pulse 54, respirations 16, BP 131/95 mmHg O2 sat 100% on room air.  The patient is now on nasal cannula oxygen at 2 LPM most recent O2 sat is 100%.  She received furosemide 60 mg IVP and her home meds were ordered in the ED including metoprolol.   Lab work: Coronavirus, RSV and influenza PCR was negative.  CBC is her white count 9.2, hemoglobin 9.4 g/dL platelets 255.  Troponin x 2 normal.  BNP 580.4 pg/mL.  CMP showed a potassium of 5.4 mmol/L, glucose 111, BUN 63 and creatinine 1.51 mg/dL.  The rest of the CMP measurements were normal.  Baseline creatinine in the last 2 years usually ranges between 1.2 and 1.6 mg/deciliter.   Imaging:  Portable 1 view chest radiograph with a new right hilar/perihilar masslike density measuring 4.0 x 4.2 cm concerning for malignancy.  There is a new small right pleural effusion.  CT chest with contrast with marked enlargement of the main pulmonary artery and right pulmonary artery compatible with pulmonary hypertension.  No hilar mass identified.  Small bilateral pleural effusions with bibasilar compressive atelectasis.  Central peribronchial wall thickening that may be infectious or inflammatory.  Cardiomegaly.  1 cm incidental left thyroid nodule with no follow-up recommended.   **Interim History Patient has continued to be diuresed given her volume overload.  She requires oxygen and will need to be weaned but she is improving.  PT OT to evaluate and PT OT recommending home health.  Renal function elevated in the setting of diuresis but will hold IV Lasix dose today given the slight bump in renal function.   Assessment and Plan:  Acute Respiratory Failure with Hypoxia in the setting of Acute on Chronic Diastolic CHF (congestive heart failure) (HCC) -Observation/SDU will change to inpatient telemetry -Supplemental oxygen as needed. -Sodium and fluid restriction. -Hold Furosemide Today  -SpO2: 95 % O2 Flow Rate (L/min): 2 L/min -Monitor daily weights, intake and output.;  -Patient is -5.539 Liters  -Monitor renal function electrolytes. -Check Echocardiogram and showed "Small mid cavitary gradient . Left ventricular ejection fraction, by estimation, is 60 to 65%. The left ventricle has normal function. The left ventricle has no regional wall motion abnormalities. There is moderate left ventricular hypertrophy. Left ventricular diastolic parameters are consistent with Grade II diastolic  dysfunction (pseudonormalization). Elevated left ventricular end-diastolic pressure." -DG Chest X-Ray done today and showed "The heart is normal in size. Stable prominent right hilum due to right pulmonary artery  prominence. Underlying emphysematous changes and pulmonary scarring. Persistent small bilateral pleural effusions and bibasilar atelectasis." -On IV Meropenem  -Hold beta-blocker due to bradycardia/CHF exacerbation. -Continue to monitor for signs and symptoms of volume overload and repeat chest x-ray in a.m. -Will continue Xopenex/Atrovent, Flutter Valve, and Incentive Spirometry  -PT and OT to further evaluate and treat and PT recommending Home Health initially but now recommending SNF   Hypothermia, improved  -Warming measures. -TSH normal. -CXR with cardiomegaly and edema. -Urinalysis with pyuria. -Urine culture and sensitivity ordered. -Transferred to stepdown. -History of ESBL infection so meropenem started and family confirms that she was testing positive for a UTI -Being treated with Meropenem   Leukocytosis -Worsening but ? Reactive -WBC Trend: Recent Labs  Lab 10/20/22 1658 10/21/22 1455 10/22/22 0301 10/23/22 0442 10/24/22 0443 10/25/22 0415  WBC 9.2 6.5 9.6 10.2 12.3* 12.3*  -No S/Sx of Infection as she denies any Respiratory Symptoms such as cough or urinary Symptoms -On Meropenem -Continue to Monitor and and Trend and Repeat CBC in the AM   Right Knee Pain -DG Knee ordered for further evaluation and showed "Moderate patellofemoral  compartment and mild medial and lateral compartment degenerative changes. Chondrocalcinosis." -Will order Topical Analgesics -Has a Hx of getting Steroid Injections and may get Ortho Involvement  -Knee Pain limiting ability for the patient to ambulate and now PT/OT recommend SNF   Hx of ESBL UTI but now showing Klebsiella Pneumoniae  -Urinalysis showed clear appearance with moderate leukocytes, negative nitrites, no bacteria seen, greater than 50 WBCs but urine culture showing greater than 100,000 colony-forming units of gram-negative rods and turned out to be Klebsiella  Pneumoniae -WBC Trend: Recent Labs  Lab 10/20/22 1658  10/21/22 1455 10/22/22 0301 10/23/22 0442 10/24/22 0443 10/25/22 0415  WBC 9.2 6.5 9.6 10.2 12.3* 12.3*  -Cx Sensitivities:              Klebsiella pneumoniae      MIC      AMPICILLIN >=32 RESIST... Resistant      AMPICILLIN/SULBACTAM 16 INTERMED... Intermediate      CEFAZOLIN <=4 SENSITIVE Sensitive      CEFEPIME <=0.12 SENS... Sensitive      CEFTRIAXONE <=0.25 SENS... Sensitive      CIPROFLOXACIN <=0.25 SENS... Sensitive      GENTAMICIN <=1 SENSITIVE Sensitive      IMIPENEM <=0.25 SENS... Sensitive      NITROFURANTOIN 128 RESISTANT Resistant      PIP/TAZO <=4 SENSITIVE Sensitive      TRIMETH/SULFA >=320 RESIS... Resistant    -Continued IV meropenem but will de-escalate to po Keflex     Primary pulmonary hypertension (HCC) -Check echocardiogram and showed "There is severely elevated pulmonary artery systolic pressure. The tricuspid regurgitant velocity is  3.43 m/s, and with an assumed right atrial pressure of 15 mmHg, the estimated right ventricular systolic pressure is 63.3 mmHg " -CT Scan of the Chest w/ Contrast done and showed "Marked enlargement of the main pulmonary artery and right pulmonary artery compatible with pulmonary arterial hypertension. No hilar mass identified. Small bilateral pleural effusions with bibasilar compressive atelectasis. Central peribronchial wall thickening may be infectious/inflammatory. Cardiomegaly. 1 cm incidental left thyroid nodule. No follow-up imaging is recommended." -IV diuresis was held yesterday but resumed today at IV 20 mg x1   Essential Hypertension -Continue amlodipine  2.5 mg p.o. daily. -Hold metoprolol due to bradycardia acute CHF. -Held Lasix yesterday but will give another dose of IV Lasix 20 mg x1 today  -Continue to Monitor BP per Protocol; Last BP reading is now 135/102   CKD (chronic kidney disease), stage IIIb (HCC) -BUN/Cr Trend: Recent Labs  Lab 10/20/22 1658 10/21/22 1455 10/22/22 0301 10/23/22 0442  10/24/22 0443 10/25/22 0415  BUN 63* 61* 55* 60* 58* 59*  CREATININE 1.51* 1.49* 1.51* 1.94* 1.74* 1.95*  -Avoid Nephrotoxic Medications, Contrast Dyes, Hypotension and Dehydration to Ensure Adequate Renal Perfusion and will need to Renally Adjust Meds -Continue to Monitor and Trend Renal Function carefully and repeat CMP in the AM    Hyperlipidemia -Continue Atorvastatin 80 mg p.o. daily.   Gastroesophageal reflux disease -Continue famotidine 20 mg p.o. daily.   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 -PT/OT to Evaluate and Treat OT recommending SNF but PT recommending home health but will have them re-evaluate given that she feels weaker   Iron Deficiency Anemia -Hgb/Hct Trend: Recent Labs  Lab 10/20/22 1658 10/21/22 1455 10/22/22 0301 10/23/22 0442 10/24/22 0443 10/25/22 0415  HGB 9.4* 9.1* 9.1* 8.9* 9.2* 8.7*  HCT 32.2* 31.4* 30.2* 29.5* 30.2* 28.6*  MCV 91.0 92.6 89.3 89.7 88.8 88.5  -Checked Anemia Panel showed Iron 36, UIBC 269, TIBC 305, Saturation Ratios 12, Ferritin of 37, Folate of 6.7, and Vitamin 357 -Continue to Monitor for S/Sx of Bleeding; No overt bleeding noted -Repeat CBC in the AM    Glaucoma -Continue current eyedrops, except for timolol.   Hyperkalemia -K+ Trend: Recent Labs  Lab 10/20/22 1658 10/21/22 1455 10/22/22 0301 10/23/22 0442 10/24/22 0443 10/25/22 0415  K 5.4* 5.2* 5.8* 5.1 5.1 4.7  -Given Lasix and Lokelma with improvement; held Lasix dose yesterday but will resume again today and give a dose of IV 20 mg today -Continue to Monitor and Trend   Hypoalbuminemia -Patient's Albumin Level Trend: Recent Labs  Lab 10/20/22 1658 10/22/22 0301 10/23/22 0442 10/24/22 0443 10/25/22 0415  ALBUMIN 3.5 2.6* 2.5* 2.5* 2.1*  -Continue to Monitor and Trend and repeat CMP in the AM    Venous Insufficiency and Neuropathic Ulcerations -WOC Nurse consulted -C/w Louretta Parma Boots and to  be changed  DVT prophylaxis: SCDs Start: 10/21/22 1423    Code Status: DNR Family Communication: Discussed with Daughter at bedside   Disposition Plan:  Level of care: Telemetry Status is: Inpatient Remains inpatient appropriate because: needs SNF now and evaluation for her Right Knee Pain   Consultants:  None  Procedures:  ECHOCARDIOGRAM IMPRESSIONS     1. Small mid cavitary gradient . Left ventricular ejection fraction, by  estimation, is 60 to 65%. The left ventricle has normal function. The left  ventricle has no regional wall motion abnormalities. There is moderate  left ventricular hypertrophy. Left  ventricular diastolic parameters are consistent with Grade II diastolic  dysfunction (pseudonormalization). Elevated left ventricular end-diastolic  pressure.   2. Right ventricular systolic function is normal. The right ventricular  size is normal. There is severely elevated pulmonary artery systolic  pressure.   3. Mean gradient across valve in diastole 12 mmHg but MVA normal by PT1/2  secondary to MAC. The mitral valve is normal in structure. Mild mitral  valve regurgitation. No evidence of mitral stenosis. Moderate mitral  annular calcification.   4. Tricuspid valve regurgitation is moderate.   5. The aortic valve is tricuspid. There is moderate calcification of  the  aortic valve. There is moderate thickening of the aortic valve. Aortic  valve regurgitation is not visualized. Aortic valve  sclerosis/calcification is present, without any evidence  of aortic stenosis.   6. The inferior vena cava is normal in size with greater than 50%  respiratory variability, suggesting right atrial pressure of 3 mmHg.   FINDINGS   Left Ventricle: Small mid cavitary gradient. Left ventricular ejection  fraction, by estimation, is 60 to 65%. The left ventricle has normal  function. The left ventricle has no regional wall motion abnormalities.  The left ventricular internal cavity  size   was normal in size. There is moderate left ventricular hypertrophy. Left  ventricular diastolic parameters are consistent with Grade II diastolic  dysfunction (pseudonormalization). Elevated left ventricular end-diastolic  pressure.   Right Ventricle: The right ventricular size is normal. No increase in  right ventricular wall thickness. Right ventricular systolic function is  normal. There is severely elevated pulmonary artery systolic pressure. The  tricuspid regurgitant velocity is  3.43 m/s, and with an assumed right atrial pressure of 15 mmHg, the  estimated right ventricular systolic pressure is 81.1 mmHg.   Left Atrium: Left atrial size was normal in size.   Right Atrium: Right atrial size was normal in size.   Pericardium: There is no evidence of pericardial effusion.   Mitral Valve: Mean gradient across valve in diastole 12 mmHg but MVA  normal by PT1/2 secondary to MAC. The mitral valve is normal in structure.  There is moderate thickening of the mitral valve leaflet(s). Moderate  mitral annular calcification. Mild  mitral valve regurgitation. No evidence of mitral valve stenosis. MV peak  gradient, 23.8 mmHg. The mean mitral valve gradient is 12.0 mmHg.   Tricuspid Valve: The tricuspid valve is normal in structure. Tricuspid  valve regurgitation is moderate . No evidence of tricuspid stenosis.   Aortic Valve: The aortic valve is tricuspid. There is moderate  calcification of the aortic valve. There is moderate thickening of the  aortic valve. Aortic valve regurgitation is not visualized. Aortic valve  sclerosis/calcification is present, without any   evidence of aortic stenosis. Aortic valve mean gradient measures 11.0  mmHg. Aortic valve peak gradient measures 19.7 mmHg. Aortic valve area, by  VTI measures 2.46 cm.   Pulmonic Valve: The pulmonic valve was normal in structure. Pulmonic valve  regurgitation is not visualized. No evidence of pulmonic stenosis.    Aorta: The aortic root is normal in size and structure.   Venous: The inferior vena cava is normal in size with greater than 50%  respiratory variability, suggesting right atrial pressure of 3 mmHg.   IAS/Shunts: No atrial level shunt detected by color flow Doppler.     LEFT VENTRICLE  PLAX 2D  LVIDd:         3.30 cm   Diastology  LVIDs:         2.00 cm   LV e' medial:    4.57 cm/s  LV PW:         1.40 cm   LV E/e' medial:  35.2  LV IVS:        1.50 cm   LV e' lateral:   4.68 cm/s  LVOT diam:     2.10 cm   LV E/e' lateral: 34.4  LV SV:         116  LV SV Index:   72  LVOT Area:     3.46 cm     RIGHT VENTRICLE  IVC  RV S prime:     10.20 cm/s  IVC diam: 2.05 cm  TAPSE (M-mode): 2.0 cm   LEFT ATRIUM             Index        RIGHT ATRIUM           Index  LA diam:        2.90 cm 1.80 cm/m   RA Area:     13.10 cm  LA Vol (A2C):   61.1 ml 37.86 ml/m  RA Volume:   29.50 ml  18.28 ml/m  LA Vol (A4C):   35.0 ml 21.69 ml/m  LA Biplane Vol: 49.9 ml 30.92 ml/m   AORTIC VALVE  AV Area (Vmax):    2.34 cm  AV Area (Vmean):   2.36 cm  AV Area (VTI):     2.46 cm  AV Vmax:           222.00 cm/s  AV Vmean:          152.667 cm/s  AV VTI:            0.470 m  AV Peak Grad:      19.7 mmHg  AV Mean Grad:      11.0 mmHg  LVOT Vmax:         150.00 cm/s  LVOT Vmean:        104.000 cm/s  LVOT VTI:          0.334 m  LVOT/AV VTI ratio: 0.71    AORTA  Ao Root diam: 3.30 cm  Ao Asc diam:  3.30 cm   MITRAL VALVE                  TRICUSPID VALVE  MV Area (PHT): 3.42 cm       TR Peak grad:   47.1 mmHg  MV Area VTI:   2.00 cm       TR Vmax:        343.00 cm/s  MV Peak grad:  23.8 mmHg  MV Mean grad:  12.0 mmHg      SHUNTS  MV Vmax:       2.44 m/s       Systemic VTI:  0.33 m  MV Vmean:      170.0 cm/s     Systemic Diam: 2.10 cm  MV Decel Time: 222 msec  MR Peak grad:    106.1 mmHg  MR Mean grad:    63.0 mmHg  MR Vmax:         515.00 cm/s  MR Vmean:        381.0 cm/s   MR PISA:         2.26 cm  MR PISA Eff ROA: 17 mm  MR PISA Radius:  0.60 cm  MV E velocity: 161.00 cm/s  MV A velocity: 180.00 cm/s  MV E/A ratio:  0.89   Antimicrobials:  Anti-infectives (From admission, onward)    Start     Dose/Rate Route Frequency Ordered Stop   10/25/22 1115  cephALEXin (KEFLEX) capsule 500 mg        500 mg Oral Every 12 hours 10/25/22 1018     10/21/22 2000  cefTRIAXone (ROCEPHIN) 1 g in sodium chloride 0.9 % 100 mL IVPB  Status:  Discontinued        1 g 200 mL/hr over 30 Minutes Intravenous Every 24 hours 10/21/22 1859 10/21/22 1906   10/21/22 1915  meropenem (MERREM) 1,000  mg in sodium chloride 0.9 % 100 mL IVPB  Status:  Discontinued       Note to Pharmacy: Adjust dose as needed.  History of ESBL UTIs presenting with hypothermia and pyuria on urine analysis.   1,000 mg 200 mL/hr over 30 Minutes Intravenous Every 12 hours 10/21/22 1906 10/25/22 1018       Subjective: Seen and Examined at bedside and her breathing was doing much better however she is having very difficult time ambulating and states that she cannot really put pressure on her knee given the pain.  Also having trouble with her left elbow.  She continues to be weak.  Initially she is okay to go home with home health PT however now the physical therapist is recommending SNF.  Objective: Vitals:   10/25/22 0833 10/25/22 1021 10/25/22 1337 10/25/22 1401  BP:  116/68 (!) 135/102   Pulse:   (!) 101   Resp:   18   Temp:      TempSrc:      SpO2: 92%  96% 95%  Weight:      Height:        Intake/Output Summary (Last 24 hours) at 10/25/2022 1806 Last data filed at 10/25/2022 1856 Gross per 24 hour  Intake --  Output 500 ml  Net -500 ml   Filed Weights   10/23/22 0500 10/24/22 0456 10/25/22 0500  Weight: 63 kg 63.8 kg 62.5 kg   Examination: Physical Exam:  Constitutional: Elderly Caucasian female in no acute distress sitting up in the bedside Respiratory: Diminished to auscultation  bilaterally with coarse breath sounds, no wheezing, rales, rhonchi or crackles. Normal respiratory effort and patient is not tachypenic. No accessory muscle use.  Unlabored breathing and not wearing any supplemental oxygen via nasal cannula Cardiovascular: RRR, no murmurs / rubs / gallops. S1 and S2 auscultated.  Trace extremity edema Abdomen: Soft, non-tender, distended secondary to body habitus. Bowel sounds positive.  GU: Deferred. Musculoskeletal: No clubbing / cyanosis of digits/nails. No joint deformity upper and lower extremities.  Skin: No rashes, lesions, ulcers on limited skin evaluation. No induration; Warm and dry.  Neurologic: CN 2-12 grossly intact with no focal deficits. Romberg sign cerebellar reflexes not assessed.  Psychiatric: Normal judgment and insight. Alert and oriented x 2. Normal mood and appropriate affect.   Data Reviewed: I have personally reviewed following labs and imaging studies  CBC: Recent Labs  Lab 10/20/22 1658 10/21/22 1455 10/22/22 0301 10/23/22 0442 10/24/22 0443 10/25/22 0415  WBC 9.2 6.5 9.6 10.2 12.3* 12.3*  NEUTROABS 7.0  --   --  7.6 9.6* 9.5*  HGB 9.4* 9.1* 9.1* 8.9* 9.2* 8.7*  HCT 32.2* 31.4* 30.2* 29.5* 30.2* 28.6*  MCV 91.0 92.6 89.3 89.7 88.8 88.5  PLT 255 212 177 198 173 314   Basic Metabolic Panel: Recent Labs  Lab 10/21/22 1455 10/22/22 0301 10/23/22 0442 10/24/22 0443 10/25/22 0415  NA 140 142 140 137 138  K 5.2* 5.8* 5.1 5.1 4.7  CL 108 108 103 102 105  CO2 _0 GLUCOSE 94 64* 95 104* 113*  BUN 61* 55* 60* 58* 59*  CREATININE 1.49* 1.51* 1.94* 1.74* 1.95*  CALCIUM 9.3 9.5 9.2 9.0 8.7*  MG 2.1  --  1.8 1.8 1.7  PHOS 5.7*  --  4.8* 4.7* 4.4   GFR: Estimated Creatinine Clearance: 13.6 mL/min (A) (by C-G formula based on SCr of 1.95 mg/dL (H)). Liver Function Tests: Recent Labs  Lab 10/20/22 1658  10/22/22 0301 10/23/22 0442 10/24/22 0443 10/25/22 0415  AST _0 ALT _1 ALKPHOS  110 91 89 77 77  BILITOT 0.3 0.7 0.4 0.7 0.6  PROT 6.5 5.5* 5.9* 5.5* 5.1*  ALBUMIN 3.5 2.6* 2.5* 2.5* 2.1*   No results for input(s): "LIPASE", "AMYLASE" in the last 168 hours. No results for input(s): "AMMONIA" in the last 168 hours. Coagulation Profile: No results for input(s): "INR", "PROTIME" in the last 168 hours. Cardiac Enzymes: No results for input(s): "CKTOTAL", "CKMB", "CKMBINDEX", "TROPONINI" in the last 168 hours. BNP (last 3 results) No results for input(s): "PROBNP" in the last 8760 hours. HbA1C: No results for input(s): "HGBA1C" in the last 72 hours. CBG: Recent Labs  Lab 10/22/22 0616  GLUCAP 113*   Lipid Profile: No results for input(s): "CHOL", "HDL", "LDLCALC", "TRIG", "CHOLHDL", "LDLDIRECT" in the last 72 hours. Thyroid Function Tests: No results for input(s): "TSH", "T4TOTAL", "FREET4", "T3FREE", "THYROIDAB" in the last 72 hours. Anemia Panel: Recent Labs    10/25/22 0415  VITAMINB12 357  FOLATE 6.7  FERRITIN 37  TIBC 305  IRON 36  RETICCTPCT 2.0   Sepsis Labs: No results for input(s): "PROCALCITON", "LATICACIDVEN" in the last 168 hours.  Recent Results (from the past 240 hour(s))  Resp panel by RT-PCR (RSV, Flu A&B, Covid) Anterior Nasal Swab     Status: None   Collection Time: 10/20/22  6:55 PM   Specimen: Anterior Nasal Swab  Result Value Ref Range Status   SARS Coronavirus 2 by RT PCR NEGATIVE NEGATIVE Final    Comment: (NOTE) SARS-CoV-2 target nucleic acids are NOT DETECTED.  The SARS-CoV-2 RNA is generally detectable in upper respiratory specimens during the acute phase of infection. The lowest concentration of SARS-CoV-2 viral copies this assay can detect is 138 copies/mL. A negative result does not preclude SARS-Cov-2 infection and should not be used as the sole basis for treatment or other patient management decisions. A negative result may occur with  improper specimen collection/handling, submission of specimen other than  nasopharyngeal swab, presence of viral mutation(s) within the areas targeted by this assay, and inadequate number of viral copies(<138 copies/mL). A negative result must be combined with clinical observations, patient history, and epidemiological information. The expected result is Negative.  Fact Sheet for Patients:  EntrepreneurPulse.com.au  Fact Sheet for Healthcare Providers:  IncredibleEmployment.be  This test is no t yet approved or cleared by the [place] FDA and  has been authorized for detection and/or diagnosis of SARS-CoV-2 by FDA under an Emergency Use Authorization (EUA). This EUA will remain  in effect (meaning this test can be used) for the duration of the COVID-19 declaration under Section 564(b)(1) of the Act, 21 U.S.C.section 360bbb-3(b)(1), unless the authorization is terminated  or revoked sooner.       Influenza A by PCR NEGATIVE NEGATIVE Final   Influenza B by PCR NEGATIVE NEGATIVE Final    Comment: (NOTE) The Xpert Xpress SARS-CoV-2/FLU/RSV plus assay is intended as an aid in the diagnosis of influenza from Nasopharyngeal swab specimens and should not be used as a sole basis for treatment. Nasal washings and aspirates are unacceptable for Xpert Xpress SARS-CoV-2/FLU/RSV testing.  Fact Sheet for Patients: EntrepreneurPulse.com.au  Fact Sheet for Healthcare Providers: IncredibleEmployment.be  This test is not yet approved or cleared by the [place] FDA and has been authorized for detection and/or diagnosis of SARS-CoV-2 by FDA under an Emergency Use Authorization (EUA). This EUA will remain in  effect (meaning this test can be used) for the duration of the COVID-19 declaration under Section 564(b)(1) of the Act, 21 U.S.C. section 360bbb-3(b)(1), unless the authorization is terminated or revoked.     Resp Syncytial Virus by PCR NEGATIVE NEGATIVE Final    Comment:  (NOTE) Fact Sheet for Patients: EntrepreneurPulse.com.au  Fact Sheet for Healthcare Providers: IncredibleEmployment.be  This test is not yet approved or cleared by the [place] FDA and has been authorized for detection and/or diagnosis of SARS-CoV-2 by FDA under an Emergency Use Authorization (EUA). This EUA will remain in effect (meaning this test can be used) for the duration of the COVID-19 declaration under Section 564(b)(1) of the Act, 21 U.S.C. section 360bbb-3(b)(1), unless the authorization is terminated or revoked.  Performed at [facility], [address] [place], [place] [address]   MRSA Next Gen by PCR, Nasal     Status: None   Collection Time: 10/21/22  6:49 PM   Specimen: Nasal Mucosa; Nasal Swab  Result Value Ref Range Status   MRSA by PCR Next Gen NOT DETECTED NOT DETECTED Final    Comment: (NOTE) The GeneXpert MRSA Assay (FDA approved for NASAL specimens only), is one component of a comprehensive MRSA colonization surveillance program. It is not intended to diagnose MRSA infection nor to guide or monitor treatment for MRSA infections. Test performance is not FDA approved in patients less than 35 years old. Performed at [facility], [place] [address] [place], [place] [address]   Urine Culture     Status: Abnormal   Collection Time: 10/21/22  9:30 PM   Specimen: Urine, Catheterized  Result Value Ref Range Status   Specimen Description   Final    URINE, CATHETERIZED Performed at [facility] [address], [place], [place] [address]    Special Requests   Final    NONE Performed at [facility], [place] [address], [place], [place] [address]    Culture >=100,000 COLONIES/mL KLEBSIELLA PNEUMONIAE (A)  Final   Report Status 10/23/2022 FINAL  Final   Organism ID, Bacteria KLEBSIELLA PNEUMONIAE (A)  Final      Susceptibility   Klebsiella  pneumoniae - MIC*    AMPICILLIN >=32 RESISTANT Resistant     CEFAZOLIN <=4 SENSITIVE Sensitive     CEFEPIME <=0.12 SENSITIVE Sensitive     CEFTRIAXONE <=0.25 SENSITIVE Sensitive     CIPROFLOXACIN <=0.25 SENSITIVE Sensitive     GENTAMICIN <=1 SENSITIVE Sensitive     IMIPENEM <=0.25 SENSITIVE Sensitive     NITROFURANTOIN 128 RESISTANT Resistant     TRIMETH/SULFA >=320 RESISTANT Resistant     AMPICILLIN/SULBACTAM 16 INTERMEDIATE Intermediate     PIP/TAZO <=4 SENSITIVE Sensitive     * >=100,000 COLONIES/mL KLEBSIELLA PNEUMONIAE    Radiology Studies: DG Knee 1-2 Views Right  Result Date: 10/25/2022 CLINICAL DATA:  Right knee pain EXAM: RIGHT KNEE - 2 VIEW COMPARISON:  None Available. FINDINGS: No evidence of fracture, dislocation, or joint effusion. Moderate patellofemoral compartment and mild medial and lateral compartment degenerative changes. Diffuse demineralization. Chondrocalcinosis. Vascular calcifications. Soft tissues are unremarkable. IMPRESSION: 1. Moderate patellofemoral compartment and mild medial and lateral compartment degenerative changes. 2. Chondrocalcinosis. Electronically Signed   By: Yetta Glassman M.D.   On: 10/25/2022 12:07   DG CHEST PORT 1 VIEW  Result Date: 10/24/2022 CLINICAL DATA:  Shortness of breath. EXAM: PORTABLE CHEST 1 VIEW COMPARISON:  Chest x-ray 10/23/2022 and chest CT 10/20/2022. FINDINGS: The heart is normal in size.  Stable prominent right hilum due to right pulmonary artery prominence. Underlying emphysematous changes and pulmonary scarring. Persistent small bilateral pleural effusions and bibasilar atelectasis. IMPRESSION: Persistent small bilateral pleural effusions and bibasilar atelectasis. Electronically Signed   By: Marijo Sanes M.D.   On: 10/24/2022 08:19    Scheduled Meds:  amLODipine  2.5 mg Oral Daily   aspirin  81 mg Oral Daily   atorvastatin  80 mg Oral q1800   brimonidine  1 drop Both Eyes BID   cephALEXin  500 mg Oral Q12H    Chlorhexidine Gluconate Cloth  6 each Topical Daily   diclofenac Sodium  2 g Topical QID   famotidine  20 mg Oral Daily   ipratropium  0.5 mg Nebulization TID   levalbuterol  0.63 mg Nebulization TID   Loteprednol Etabonate  1 drop Right Eye BID   mirabegron ER  25 mg Oral Daily   Continuous Infusions:   LOS: 3 days   [name], DO [facility] Available via Epic secure chat 7am-7pm After these hours, please refer to coverage provider listed on [email or web address] 10/25/2022, 6:06 PM

## 2022-10-25 NOTE — Progress Notes (Addendum)
Heart Failure Navigator Progress Note  Assessed for Heart & Vascular TOC clinic readiness.  Patient EF 60-65% G2DD, may go to SNF at discharge depending upon any improvement with mobility. . Patient has a close hospital follow up with [facility] on 11/28/22.   Navigator will sign off at this time. Earnestine Leys, BSN, RN Heart Failure Transport planner Only

## 2022-10-25 NOTE — Progress Notes (Signed)
Orthopedic Tech Progress Note Patient Details:  [name] [birth year] [id number] Bilateral Unna Boots were applied on 1/15 Ortho Devices Type of Ortho Device: Haematologist Ortho Device/Splint Location: Bi LE Ortho Device/Splint Interventions: Application   Post Interventions Patient Tolerated: Well  [name] 10/25/2022, 7:23 AM

## 2022-10-26 DIAGNOSIS — H409 Unspecified glaucoma: Secondary | ICD-10-CM | POA: Diagnosis not present

## 2022-10-26 DIAGNOSIS — I10 Essential (primary) hypertension: Secondary | ICD-10-CM | POA: Diagnosis not present

## 2022-10-26 DIAGNOSIS — I5033 Acute on chronic diastolic (congestive) heart failure: Secondary | ICD-10-CM | POA: Diagnosis not present

## 2022-10-26 DIAGNOSIS — N1832 Chronic kidney disease, stage 3b: Secondary | ICD-10-CM | POA: Diagnosis not present

## 2022-10-26 DIAGNOSIS — T68XXXD Hypothermia, subsequent encounter: Secondary | ICD-10-CM

## 2022-10-26 LAB — CBC WITH DIFFERENTIAL/PLATELET
Abs Immature Granulocytes: 0.13 10*3/uL — ABNORMAL HIGH (ref 0.00–0.07)
Basophils Absolute: 0 10*3/uL (ref 0.0–0.1)
Basophils Relative: 0 %
Eosinophils Absolute: 0.2 10*3/uL (ref 0.0–0.5)
Eosinophils Relative: 1 %
HCT: 29.7 % — ABNORMAL LOW (ref 36.0–46.0)
Hemoglobin: 8.9 g/dL — ABNORMAL LOW (ref 12.0–15.0)
Immature Granulocytes: 1 %
Lymphocytes Relative: 11 %
Lymphs Abs: 1.4 10*3/uL (ref 0.7–4.0)
MCH: 26.7 pg (ref 26.0–34.0)
MCHC: 30 g/dL (ref 30.0–36.0)
MCV: 89.2 fL (ref 80.0–100.0)
Monocytes Absolute: 1.3 10*3/uL — ABNORMAL HIGH (ref 0.1–1.0)
Monocytes Relative: 10 %
Neutro Abs: 9.9 10*3/uL — ABNORMAL HIGH (ref 1.7–7.7)
Neutrophils Relative %: 77 %
Platelets: 177 10*3/uL (ref 150–400)
RBC: 3.33 MIL/uL — ABNORMAL LOW (ref 3.87–5.11)
RDW: 16.5 % — ABNORMAL HIGH (ref 11.5–15.5)
WBC: 12.8 10*3/uL — ABNORMAL HIGH (ref 4.0–10.5)
nRBC: 0 % (ref 0.0–0.2)

## 2022-10-26 LAB — COMPREHENSIVE METABOLIC PANEL
ALT: 15 U/L (ref 0–44)
AST: 22 U/L (ref 15–41)
Albumin: 2.3 g/dL — ABNORMAL LOW (ref 3.5–5.0)
Alkaline Phosphatase: 80 U/L (ref 38–126)
Anion gap: 8 (ref 5–15)
BUN: 61 mg/dL — ABNORMAL HIGH (ref 8–23)
CO2: 26 mmol/L (ref 22–32)
Calcium: 8.9 mg/dL (ref 8.9–10.3)
Chloride: 105 mmol/L (ref 98–111)
Creatinine, Ser: 1.67 mg/dL — ABNORMAL HIGH (ref 0.44–1.00)
GFR, Estimated: 28 mL/min — ABNORMAL LOW (ref >60)
Glucose, Bld: 112 mg/dL — ABNORMAL HIGH (ref 70–99)
Potassium: 5.1 mmol/L (ref 3.5–5.1)
Sodium: 139 mmol/L (ref 135–145)
Total Bilirubin: 0.7 mg/dL (ref 0.3–1.2)
Total Protein: 5.4 g/dL — ABNORMAL LOW (ref 6.5–8.1)

## 2022-10-26 LAB — PHOSPHORUS: Phosphorus: 3.9 mg/dL (ref 2.5–4.6)

## 2022-10-26 LAB — MAGNESIUM: Magnesium: 2.6 mg/dL — ABNORMAL HIGH (ref 1.7–2.4)

## 2022-10-26 MED ORDER — LEVALBUTEROL HCL 0.63 MG/3ML IN NEBU
0.6300 mg | INHALATION_SOLUTION | Freq: Four times a day (QID) | RESPIRATORY_TRACT | Status: DC | PRN
  Administered 2022-10-27: 0.63 mg via RESPIRATORY_TRACT
  Filled 2022-10-26: qty 3

## 2022-10-26 MED ORDER — IPRATROPIUM BROMIDE 0.02 % IN SOLN: 0.5000 mg | Freq: Four times a day (QID) | RESPIRATORY_TRACT | Status: DC | PRN

## 2022-10-26 MED ORDER — FUROSEMIDE 20 MG PO TABS
20.0000 mg | ORAL_TABLET | Freq: Every day | ORAL | Status: DC
  Administered 2022-10-26 – 2022-10-27 (×2): 20 mg via ORAL
  Filled 2022-10-26 (×2): qty 1

## 2022-10-26 MED ORDER — METOPROLOL TARTRATE 5 MG/5ML IV SOLN
2.5000 mg | Freq: Once | INTRAVENOUS | Status: AC
  Administered 2022-10-26: 2.5 mg via INTRAVENOUS
  Filled 2022-10-26: qty 5

## 2022-10-26 NOTE — Progress Notes (Signed)
PROGRESS NOTE    [name]  VOJ:[id number] DOB: 1923-03-19 DOA: 10/20/2022 PCP: [name], DO    Chief Complaint  Patient presents with   Leg Swelling   Shortness of Breath    Brief Narrative:  HPI per Dr. Tennis Must on 10/21/21 [name] is a 89 y.o. 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   No chest pain, palpitations, diaphoresis, PND or orthopnea No fever, chills or night sweats. No sore throat, rhinorrhea, dyspnea, wheezing or hemoptysis  No appetite changes, abdominal pain, diarrhea, constipation, melena or hematochezia.  No flank pain, dysuria, frequency or hematuria.  No polyuria, polydipsia, polyphagia or blurred vision.     The patient was hypothermic on arrival with a temperature of 91.1 F.  She was transferred to the stepdown unit.  Urine analysis showed pyuria, but no bacteriuria.  Given hypothermia and history of ESBL UTI, urine culture and sensitivity order followed by meropenem 500 mg twice a day IVPB.   ED course: Initial vital signs were temperature 97.6 F, pulse 54, respirations 16, BP 131/95 mmHg O2 sat 100% on room air.  The patient is now on nasal cannula oxygen at 2 LPM most recent O2 sat is 100%.  She received furosemide 60 mg IVP and her home meds were ordered in the ED including metoprolol.   Lab work: Coronavirus, RSV and influenza PCR was negative.  CBC is her white count 9.2, hemoglobin 9.4 g/dL platelets 255.  Troponin x 2 normal.  BNP 580.4 pg/mL.  CMP showed a potassium of 5.4 mmol/L, glucose 111, BUN 63 and creatinine 1.51 mg/dL.  The rest of the CMP measurements were normal.  Baseline creatinine in  the last 2 years usually ranges between 1.2 and 1.6 mg/deciliter.   Imaging: Portable 1 view chest radiograph with a new right hilar/perihilar masslike density measuring 4.0 x 4.2 cm concerning for malignancy.  There is a new small right pleural effusion.  CT chest with contrast with marked enlargement of the main pulmonary artery and right pulmonary artery compatible with pulmonary hypertension.  No hilar mass identified.  Small bilateral pleural effusions with bibasilar compressive atelectasis.  Central peribronchial wall thickening that may be infectious or inflammatory.  Cardiomegaly.  1 cm incidental left thyroid nodule with no follow-up recommended.  Patient noted to be volume overloaded being diuresed with IV Lasix requiring oxygen needed to be weaned.  Renal function noted to be elevated in setting of diuresis and as such IV Lasix held.   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  Moderate tricuspid valvular regurgitation, tricuspid aortic valve with moderate thickening of aortic valve no evidence of aortic  stenosis aortic valve sclerosis/calcification present. -Patient diuresed with IV Lasix with urine output of 1.050 L over the past 24 hours, patient is -5.989 L during this hospitalization. -Patient with improvement in hypoxia. -Patient was on IV Lasix which was held due to bump in creatinine, renal  function trending down. -Placed on oral Lasix 20 mg daily. -Follow.  2.  Hypothermia, likely secondary to Klebsiella pneumoniae E. Coli -Patient noted to be hypothermic on admission, TSH within normal limits. -Chest x-ray concerning for cardiomegaly and edema. -Urinalysis consistent with UTI, urine cultures positive for Klebsiella pneumoniae. -Patient was on IV Merrem, and subsequently transition to Keflex to complete course of antibiotic treatment. -Supportive care.  3.  Leukocytosis -Likely secondary to Klebsiella pneumonia E. coli. -Patient with no respiratory symptoms. -Was on IV meropenem and has been transition to Keflex.  4.  Right knee pain -Patient with end-stage osteoarthritis of the right knee receiving steroid injections every 3 months in the outpatient setting per orthopedics. -Plain films of the right knee ordered that showed moderate patellofemoral compartment and mild medial and lateral compartment degenerative changes, chondrocalcinosis. -Orthopedics consulted who assessed patient this morning patient received a steroid injection of 1 cc Kenalog and 3 cc Marcaine. -PT/OT. -Outpatient follow-up with orthopedics.  5.  Primary pulmonary hypertension -2D echo done with severely elevated pulmonary artery systolic pressure, tricuspid regurgitant velocity 3.43 m/s and resume right atrial pressure 15 mmHg with estimated right ventricular systolic pressure 95.0 mmHg. -CT chest done with marked enlargement of main pulmonary artery and right pulmonary artery compatible with pulmonary artery hypertension.  No hilar mass noted.  Small bilateral pleural effusions with bibasilar compressive atelectasis.  Central bronchial wall thickening may be infectious/inflammatory.  Cardiomegaly.  1 cm incidental left thyroid nodule.  No follow-up imaging recommended. -IV Lasix held, placed on oral Lasix. -Outpatient follow-up.  6.  Hypertension -Was on IV Lasix which was held due to bump in  creatinine. -Was on metoprolol currently on hold due to bradycardia and an acute CHF exacerbation. -Continue Norvasc 2.5 mg daily. -Placed back on Lasix 20 mg orally daily.  7.  CKD stage IIIb -IV diuretics held due to bump in creatinine. -Renal function trending back down. -Place on oral Lasix and repeat labs in the AM.  8.  Hyperlipidemia -Statin.  9.  GERD Pepcid.  10.  Hemiplegia of nondominant side as late effect of CVA -PT/OT recommending SNF at this time.  11.  Iron deficiency anemia -Hemoglobin stable. -Follow H&H. -Transfusion threshold hemoglobin < 7.  12.  Glaucoma,- -continue home regimen eyedrops.  13.  Hyperkalemia -Status post Lasix and Lokelma with improvement. -IV Lasix held. -Placed back on oral Lasix 20 mg daily.  14.  Hypoalbuminemia -Follow.  15.  Venous insufficiency and neuropathic ulcerations -Patient seen by [place] RN. -Continue Unna boot with changes every Monday and Thursday until outpatient follow-up with Dr. Amalia Hailey.   DVT prophylaxis: SCDs Code Status: DNR Family Communication: Updated patient and son-in-law at bedside. Disposition: SNF when bed available hopefully in the next 24 to 48 hours.  Status is: Inpatient Remains inpatient appropriate because: Severity of illness   Consultants:  Orthopedics: Elizabeth Sauer, PA 10/26/2022 Wound care, RN Phineas Douglas, RN 10/23/2022  Procedures:  CT chest 10/20/2022 Chest x-ray 10/20/2022, 10/21/2022, 10/23/2022, 10/24/2022 Plain films of the right knee 10/25/2022 2D echo 10/22/2022 Steroid injection right knee per orthopedics: Genia Harold, PA 10/26/2022  Antimicrobials:  Anti-infectives (From admission, onward)    Start     Dose/Rate Route Frequency Ordered  Stop   10/25/22 1115  cephALEXin (KEFLEX) capsule 500 mg        500 mg Oral Every 12 hours 10/25/22 1018     10/21/22 2000  cefTRIAXone (ROCEPHIN) 1 g in sodium chloride 0.9 % 100 mL IVPB  Status:  Discontinued        1 g 200 mL/hr over 30  Minutes Intravenous Every 24 hours 10/21/22 1859 10/21/22 1906   10/21/22 1915  meropenem (MERREM) 1,000 mg in sodium chloride 0.9 % 100 mL IVPB  Status:  Discontinued       Note to Pharmacy: Adjust dose as needed.  History of ESBL UTIs presenting with hypothermia and pyuria on urine analysis.   1,000 mg 200 mL/hr over 30 Minutes Intravenous Every 12 hours 10/21/22 1906 10/25/22 1018         Subjective: Sitting up in recliner, eating pizza.  Son-in-law at bedside.  States lower extremity swelling has improved significantly.  Denies any chest pain.  Denies any significant shortness of breath.  Objective: Vitals:   10/26/22 0514 10/26/22 0935 10/26/22 1005 10/26/22 1244  BP: (!) 149/70  130/70 134/70  Pulse: (!) 115   96  Resp: 19   16  Temp: 98.5 F (36.9 C)   97.7 F (36.5 C)  TempSrc: Oral   Oral  SpO2: 93% 94%  97%  Weight: 62.9 kg     Height:        Intake/Output Summary (Last 24 hours) at 10/26/2022 1925 Last data filed at 10/26/2022 1340 Gross per 24 hour  Intake 120 ml  Output 1050 ml  Net -930 ml   Filed Weights   10/24/22 0456 10/25/22 0500 10/26/22 0514  Weight: 63.8 kg 62.5 kg 62.9 kg    Examination:  General exam: Appears calm and comfortable  Respiratory system: Clear to auscultation. Respiratory effort normal. Cardiovascular system: RRR with  3/6 SEM left upper sternal border.  No JVD, murmurs, rubs, gallops or clicks. No pedal edema. Gastrointestinal system: Abdomen is nondistended, soft and nontender. No organomegaly or masses felt. Normal bowel sounds heard. Central nervous system: Alert and oriented. No focal neurological deficits. Extremities: Symmetric 5 x 5 power.  Lower extremities in Unna boots. Skin: No rashes, lesions or ulcers Psychiatry: Judgement and insight appear normal. Mood & affect appropriate.     Data Reviewed: I have personally reviewed following labs and imaging studies  CBC: Recent Labs  Lab 10/20/22 1658 10/21/22 1455  10/22/22 0301 10/23/22 0442 10/24/22 0443 10/25/22 0415 10/26/22 0427  WBC 9.2   < > 9.6 10.2 12.3* 12.3* 12.8*  NEUTROABS 7.0  --   --  7.6 9.6* 9.5* 9.9*  HGB 9.4*   < > 9.1* 8.9* 9.2* 8.7* 8.9*  HCT 32.2*   < > 30.2* 29.5* 30.2* 28.6* 29.7*  MCV 91.0   < > 89.3 89.7 88.8 88.5 89.2  PLT 255   < > 177 198 173 170 177   < > = values in this interval not displayed.    Basic Metabolic Panel: Recent Labs  Lab 10/21/22 1455 10/22/22 0301 10/23/22 0442 10/24/22 0443 10/25/22 0415 10/26/22 0427  NA 140 142 140 137 138 139  K 5.2* 5.8* 5.1 5.1 4.7 5.1  CL 108 108 103 102 105 105  CO2 25 23 26 26 26 26   GLUCOSE 94 64* 95 104* 113* 112*  BUN 61* 55* 60* 58* 59* 61*  CREATININE 1.49* 1.51* 1.94* 1.74* 1.95* 1.67*  CALCIUM 9.3 9.5 9.2 9.0 8.7*  8.9  MG 2.1  --  1.8 1.8 1.7 2.6*  PHOS 5.7*  --  4.8* 4.7* 4.4 3.9    GFR: Estimated Creatinine Clearance: 16 mL/min (A) (by C-G formula based on SCr of 1.67 mg/dL (H)).  Liver Function Tests: Recent Labs  Lab 10/22/22 0301 10/23/22 0442 10/24/22 0443 10/25/22 0415 10/26/22 0427  AST 27 20 20 18 22   ALT 25 20 17 13 15   ALKPHOS 91 89 77 77 80  BILITOT 0.7 0.4 0.7 0.6 0.7  PROT 5.5* 5.9* 5.5* 5.1* 5.4*  ALBUMIN 2.6* 2.5* 2.5* 2.1* 2.3*    CBG: Recent Labs  Lab 10/22/22 0616  GLUCAP 113*     Recent Results (from the past 240 hour(s))  Resp panel by RT-PCR (RSV, Flu A&B, Covid) Anterior Nasal Swab     Status: None   Collection Time: 10/20/22  6:55 PM   Specimen: Anterior Nasal Swab  Result Value Ref Range Status   SARS Coronavirus 2 by RT PCR NEGATIVE NEGATIVE Final    Comment: (NOTE) SARS-CoV-2 target nucleic acids are NOT DETECTED.  The SARS-CoV-2 RNA is generally detectable in upper respiratory specimens during the acute phase of infection. The lowest concentration of SARS-CoV-2 viral copies this assay can detect is 138 copies/mL. A negative result does not preclude SARS-Cov-2 infection and should not be used as  the sole basis for treatment or other patient management decisions. A negative result may occur with  improper specimen collection/handling, submission of specimen other than nasopharyngeal swab, presence of viral mutation(s) within the areas targeted by this assay, and inadequate number of viral copies(<138 copies/mL). A negative result must be combined with clinical observations, patient history, and epidemiological information. The expected result is Negative.  Fact Sheet for Patients:  10/24/22  Fact Sheet for Healthcare Providers:  12/19/22  This test is no t yet approved or cleared by the [employer] FDA and  has been authorized for detection and/or diagnosis of SARS-CoV-2 by FDA under an Emergency Use Authorization (EUA). This EUA will remain  in effect (meaning this test can be used) for the duration of the COVID-19 declaration under Section 564(b)(1) of the Act, 21 U.S.C.section 360bbb-3(b)(1), unless the authorization is terminated  or revoked sooner.       Influenza A by PCR NEGATIVE NEGATIVE Final   Influenza B by PCR NEGATIVE NEGATIVE Final    Comment: (NOTE) The Xpert Xpress SARS-CoV-2/FLU/RSV plus assay is intended as an aid in the diagnosis of influenza from Nasopharyngeal swab specimens and should not be used as a sole basis for treatment. Nasal washings and aspirates are unacceptable for Xpert Xpress SARS-CoV-2/FLU/RSV testing.  Fact Sheet for Patients: [employer]ker[employer]it  Fact Sheet for Healthcare Providers: [place]  This test is not yet approved or cleared by the [employer] FDA and has been authorized for detection and/or diagnosis of SARS-CoV-2 by FDA under an Emergency Use Authorization (EUA). This EUA will remain in effect (meaning this test can be used) for the duration of the COVID-19 declaration under Section 564(b)(1) of  the Act, 21 U.S.C. section 360bbb-3(b)(1), unless the authorization is terminated or revoked.     Resp Syncytial Virus by PCR NEGATIVE NEGATIVE Final    Comment: (NOTE) Fact Sheet for Patients: SeriousBroker.it  Fact Sheet for Healthcare Providers: [place]  This test is not yet approved or cleared by the [employer] FDA and has been authorized for detection and/or diagnosis of SARS-CoV-2 by FDA under an Emergency Use Authorization (EUA). This EUA will remain in  effect (meaning this test can be used) for the duration of the COVID-19 declaration under Section 564(b)(1) of the Act, 21 U.S.C. section 360bbb-3(b)(1), unless the authorization is terminated or revoked.  Performed at [facility], [address], [place], [place] [address]   MRSA Next Gen by PCR, Nasal     Status: None   Collection Time: 10/21/22  6:49 PM   Specimen: Nasal Mucosa; Nasal Swab  Result Value Ref Range Status   MRSA by PCR Next Gen NOT DETECTED NOT DETECTED Final    Comment: (NOTE) The GeneXpert MRSA Assay (FDA approved for NASAL specimens only), is one component of a comprehensive MRSA colonization surveillance program. It is not intended to diagnose MRSA infection nor to guide or monitor treatment for MRSA infections. Test performance is not FDA approved in patients less than 27 years old. Performed at [facility], [place] [address] [place], [place] [address]   Urine Culture     Status: Abnormal   Collection Time: 10/21/22  9:30 PM   Specimen: Urine, Catheterized  Result Value Ref Range Status   Specimen Description   Final    URINE, CATHETERIZED Performed at [facility] [address] [place], [place] [address]    Special Requests   Final    NONE Performed at [facility], [place] [address] [place], [place] [address]    Culture >=100,000 COLONIES/mL  KLEBSIELLA PNEUMONIAE (A)  Final   Report Status 10/23/2022 FINAL  Final   Organism ID, Bacteria KLEBSIELLA PNEUMONIAE (A)  Final      Susceptibility   Klebsiella pneumoniae - MIC*    AMPICILLIN >=32 RESISTANT Resistant     CEFAZOLIN <=4 SENSITIVE Sensitive     CEFEPIME <=0.12 SENSITIVE Sensitive     CEFTRIAXONE <=0.25 SENSITIVE Sensitive     CIPROFLOXACIN <=0.25 SENSITIVE Sensitive     GENTAMICIN <=1 SENSITIVE Sensitive     IMIPENEM <=0.25 SENSITIVE Sensitive     NITROFURANTOIN 128 RESISTANT Resistant     TRIMETH/SULFA >=320 RESISTANT Resistant     AMPICILLIN/SULBACTAM 16 INTERMEDIATE Intermediate     PIP/TAZO <=4 SENSITIVE Sensitive     * >=100,000 COLONIES/mL KLEBSIELLA PNEUMONIAE         Radiology Studies: DG Knee 1-2 Views Right  Result Date: 10/25/2022 CLINICAL DATA:  Right knee pain EXAM: RIGHT KNEE - 2 VIEW COMPARISON:  None Available. FINDINGS: No evidence of fracture, dislocation, or joint effusion. Moderate patellofemoral compartment and mild medial and lateral compartment degenerative changes. Diffuse demineralization. Chondrocalcinosis. Vascular calcifications. Soft tissues are unremarkable. IMPRESSION: 1. Moderate patellofemoral compartment and mild medial and lateral compartment degenerative changes. 2. Chondrocalcinosis. Electronically Signed   By: Yetta Glassman M.D.   On: 10/25/2022 12:07        Scheduled Meds:  amLODipine  2.5 mg Oral Daily   aspirin  81 mg Oral Daily   atorvastatin  80 mg Oral q1800   brimonidine  1 drop Both Eyes BID   cephALEXin  500 mg Oral Q12H   diclofenac Sodium  2 g Topical QID   famotidine  20 mg Oral Daily   furosemide  20 mg Oral Daily   Loteprednol Etabonate  1 drop Right Eye BID   mirabegron ER  25 mg Oral Daily   Continuous Infusions:   LOS: 4 days    Time spent: 35 minutes    Irine Seal, MD [facility]   To contact the attending provider between 7A-7P or the covering provider during  after hours  7P-7A, please log into the web site [email or web address] and access using universal [place] password for that web site. If you do not have the password, please call the hospital operator.  10/26/2022, 7:25 PM

## 2022-10-26 NOTE — TOC Progression Note (Signed)
Transition of Care [facility]) - Progression Note    Patient Details  Name: NICKIE WARWICK MRN: [id number] Date of Birth: 1923/07/21  Transition [facility]) CM/SW [place], LCSW Phone Number: 10/26/2022, 11:19 AM  Clinical Narrative:    CSW spoke with pt's daughter [name], to discuss recommendations for SNF placement. Beth stated she would like to see if her mother can walk more, before deciding for SNF placement. Beth reported they would like for CSW to start the process for placement while they wait. Pt's daughter reported pt has been to [facility] before but they do not have a preferred facility. CSW explained SNF placement process. cSW to work pt up for SNF placement. TOC to follow.    Expected Discharge Plan: [place] Barriers to Discharge: Continued Medical Work up  Expected Discharge Plan and Services In-house Referral: Clinical Social Work     Living arrangements for the past 2 months: Single Family Home                                       Social Determinants of Health (SDOH) Interventions SDOH Screenings   Food Insecurity: No Food Insecurity (10/21/2022)  Housing: Low Risk  (10/21/2022)  Transportation Needs: No Transportation Needs (10/21/2022)  Utilities: Not At Risk (10/21/2022)  Tobacco Use: Low Risk  (10/20/2022)    Readmission Risk Interventions    01/17/2022    3:24 PM  Readmission Risk Prevention Plan  Transportation Screening Complete  PCP or Specialist Appt within 3-5 Days Complete  Palliative Care Screening Not Applicable

## 2022-10-26 NOTE — Progress Notes (Addendum)
Physical Therapy Treatment Patient Details Name: [name] MRN: [id number] DOB: 01-13-1923 Today's Date: 10/26/2022   History of Present Illness Patient is a 89 yr old female admitted to the hospital with dyspnea and BLE edema. She was found to have acute on chronic diastolic CHF, hypothermia, and possible UTI. PMH. CAD, NSTEMI, anemia, OA, CVA, glaucoma, macular degeneration, gout, HTN    PT Comments    Pt agreeable to working with therapy. She reported she walked "a little bit" earlier today. Noted open area on outside of R great toe when changing socks on today-made RN and MD aware thru secure chat. She was able to walk across the room and back with me on today with +1 assist. Improved activity tolerance and ability to weightbear compared to yesterday. End of session: HR 87 bpm, O2 97% on RA. Pt coughing quite a bit. No family present during session. Will continue to follow and progress activity. D/c plan depends on family input.     Recommendations for follow up therapy are one component of a multi-disciplinary discharge planning process, led by the attending physician.  Recommendations may be updated based on patient status, additional functional criteria and insurance authorization.  Follow Up Recommendations  Skilled nursing-short term rehab (<3 hours/day) (vs HHPT and 24/7 supervision/assist-depending on family decision) Can patient physically be transported by private vehicle: Yes   Assistance Recommended at Discharge Frequent or constant Supervision/Assistance  Patient can return home with the following A little help with walking and/or transfers;A little help with bathing/dressing/bathroom;Assistance with cooking/housework;Direct supervision/assist for financial management;Assist for transportation;Help with stairs or ramp for entrance   Equipment Recommendations  None recommended by PT    Recommendations for Other Services OT consult     Precautions / Restrictions  Precautions Precautions: Fall Restrictions Weight Bearing Restrictions: No Other Position/Activity Restrictions: chronic bilateral feet wounds. wears specialized shoes     Mobility  Bed Mobility Overal bed mobility: Needs Assistance Bed Mobility: Supine to Sit     Supine to sit: Min assist, HOB elevated Sit to supine: Mod assist   General bed mobility comments: Min-Mod A for bed mobility. Increased time. Pt used bedrail to assist herself.    Transfers Overall transfer level: Needs assistance Equipment used: Rolling walker (2 wheels) Transfers: Sit to/from Stand Sit to Stand: From elevated surface, Min assist           General transfer comment: Assist to power up and block bil feet from sliding. Increased time. Cues provided. Improved WBing tolerance today.    Ambulation/Gait Ambulation/Gait assistance: Min assist Gait Distance (Feet): 12 Feet Assistive device: Rolling walker (2 wheels) Gait Pattern/deviations: Decreased step length - left, Decreased step length - right, Decreased stride length       General Gait Details: Used youth height RW today. Min A to steady. Pt able to walk across room and back to bed. She tolerated activity well.   Stairs             Wheelchair Mobility    Modified Rankin (Stroke Patients Only)       Balance Overall balance assessment: Needs assistance         Standing balance support: Bilateral upper extremity supported, Reliant on assistive device for balance, During functional activity Standing balance-Leahy Scale: Poor                              Cognition Arousal/Alertness: Awake/alert Behavior During Therapy: WFL for tasks  assessed/performed Overall Cognitive Status: Within Functional Limits for tasks assessed                                          Exercises      General Comments        Pertinent Vitals/Pain Pain Assessment Pain Assessment: Faces Faces Pain Scale: Hurts  little more Pain Location: R knee, L elbow Pain Descriptors / Indicators: Discomfort, Sore Pain Intervention(s): Limited activity within patient's tolerance, Monitored during session, Repositioned    Home Living                          Prior Function            PT Goals (current goals can now be found in the care plan section) Progress towards PT goals: Progressing toward goals    Frequency    Min 3X/week      PT Plan Current plan remains appropriate    Co-evaluation              AM-PAC PT "6 Clicks" Mobility   Outcome Measure  Help needed turning from your back to your side while in a flat bed without using bedrails?: A Little Help needed moving from lying on your back to sitting on the side of a flat bed without using bedrails?: A Lot Help needed moving to and from a bed to a chair (including a wheelchair)?: A Little Help needed standing up from a chair using your arms (e.g., wheelchair or bedside chair)?: A Little Help needed to walk in hospital room?: A Little Help needed climbing 3-5 steps with a railing? : Total 6 Click Score: 15    End of Session Equipment Utilized During Treatment: Gait belt Activity Tolerance: Patient tolerated treatment well;Patient limited by fatigue Patient left: in bed;with call bell/phone within reach;with bed alarm set   PT Visit Diagnosis: Muscle weakness (generalized) (M62.81);Difficulty in walking, not elsewhere classified (R26.2)     Time: 7989-2119 PT Time Calculation (min) (ACUTE ONLY): 14 min  Charges:  $Gait Training: 8-22 mins                         Doreatha Massed, PT Acute Rehabilitation  Office: [phone]

## 2022-10-26 NOTE — Plan of Care (Signed)
  Problem: Education: Goal: Knowledge of General Education information will improve Description: Including pain rating scale, medication(s)/side effects and non-pharmacologic comfort measures Outcome: Progressing   Problem: Health Behavior/Discharge Planning: Goal: Ability to manage health-related needs will improve Outcome: Progressing

## 2022-10-26 NOTE — Progress Notes (Signed)
Occupational Therapy Treatment Patient Details Name: [name] MRN: [id number] DOB: [birth year] Today's Date: 10/26/2022   History of present illness Patient is a 89 yr old female admitted to the hospital with dyspnea and BLE edema. She was found to have acute on chronic diastolic CHF, hypothermia, and possible UTI. PMH. CAD, NSTEMI, anemia, OA, CVA, glaucoma, macular degeneration, gout, HTN   OT comments  Patient was noted to have improvement in transfer assistance during this session compared to previous OT session. Patient was min A to transfer from recliner to bed with patient participating in therapeutic activity to increased functional activity tolerance. Patient's discharge plan remains appropriate at this time. OT will continue to follow acutely.     Recommendations for follow up therapy are one component of a multi-disciplinary discharge planning process, led by the attending physician.  Recommendations may be updated based on patient status, additional functional criteria and insurance authorization.    Follow Up Recommendations  Skilled nursing-short term rehab (<3 hours/day)     Assistance Recommended at Discharge Frequent or constant Supervision/Assistance  Patient can return home with the following  A lot of help with bathing/dressing/bathroom;Direct supervision/assist for medications management;A lot of help with walking and/or transfers;Direct supervision/assist for financial management;Assist for transportation;Assistance with cooking/housework;Help with stairs or ramp for entrance   Equipment Recommendations  None recommended by OT       Precautions / Restrictions Precautions Precautions: Fall Restrictions Weight Bearing Restrictions: No Other Position/Activity Restrictions: chronic bilateral feet wounds. wears specialized shoes        ADL either performed or assessed with clinical judgement      Cognition Arousal/Alertness: Awake/alert Behavior During  Therapy: WFL for tasks assessed/performed Overall Cognitive Status: Within Functional Limits for tasks assessed            Exercises Other Exercises Other Exercises: patient completed 10 sit to stands with increased time and cues for proper hand placement with min A. patient then transitioned to bed with min A with RW and cues to finish transfer prior to attempting to sit down.            Pertinent Vitals/ Pain       Pain Assessment Pain Assessment: Faces Faces Pain Scale: Hurts little more Pain Location: R knee, L elbow Pain Descriptors / Indicators: Discomfort, Sore Pain Intervention(s): Limited activity within patient's tolerance, Monitored during session         Frequency  Min 2X/week        Progress Toward Goals  OT Goals(current goals can now be found in the care plan section)  Progress towards OT goals: Progressing toward goals     Plan Discharge plan remains appropriate       AM-PAC OT "6 Clicks" Daily Activity     Outcome Measure   Help from another person eating meals?: None Help from another person taking care of personal grooming?: A Little Help from another person toileting, which includes using toliet, bedpan, or urinal?: A Lot Help from another person bathing (including washing, rinsing, drying)?: A Lot Help from another person to put on and taking off regular upper body clothing?: A Little Help from another person to put on and taking off regular lower body clothing?: A Lot 6 Click Score: 16    End of Session Equipment Utilized During Treatment: Rolling walker (2 wheels);Gait belt  OT Visit Diagnosis: Unsteadiness on feet (R26.81);Muscle weakness (generalized) (M62.81)   Activity Tolerance Patient limited by pain   Patient Left in bed;with call bell/phone  within reach;with bed alarm set   Nurse Communication Mobility status        Time: 7741-2878 OT Time Calculation (min): 16 min  Charges: OT General Charges $OT Visit: 1 Visit OT  Treatments $Therapeutic Activity: 8-22 mins  [name], MS Acute Rehabilitation Department Office# [phone]   [name] 10/26/2022, 2:54 PM

## 2022-10-26 NOTE — NC FL2 (Signed)
[place] LEVEL OF CARE FORM     IDENTIFICATION  Patient Name: [name] Birthdate: Jul 20, 1923 Sex: female Admission Date (Current Location): 10/20/2022  [facility] and [place] Number:  Herbalist and Address:  [facility],  [place], [place]      Provider Number: [id number]  Attending Physician Name and Address:  Eugenie Filler, MD  Relative Name and Phone Number:  Earnie Larsson (Daughter) [phone] (Mobile)    Current Level of Care: Hospital Recommended Level of Care: [place] Prior Approval Number:    Date Approved/Denied:   PASRR Number: [id number]  Discharge Plan: SNF    Current Diagnoses: Patient Active Problem List   Diagnosis Date Noted   Hypothermia 10/21/2022   Acute on chronic diastolic CHF (congestive heart failure) ([place]) 10/20/2022   UTI (urinary tract infection) 01/14/2022   Glaucoma 01/14/2022   Macular degeneration 01/14/2022   Cystitis 01/14/2022   Ataxia following cerebral infarction 10/07/2020   Chronic diastolic heart failure ([place]) 10/07/2020   Cough 10/07/2020   Diabetic renal disease ([place]) 10/07/2020   Encounter for general adult medical examination without abnormal findings 10/07/2020   Gastroesophageal reflux disease 10/07/2020   Generalized osteoarthritis 10/07/2020   Gout 10/07/2020   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 ([place]) 10/07/2020   Iron deficiency anemia 10/07/2020   Knee pain 10/07/2020   Osteoporosis 10/07/2020   Primary pulmonary hypertension ([place]) 10/07/2020   Recurrent falls 10/07/2020   Tricuspid valve disorder 10/07/2020   Vitamin B12 deficiency (non anemic) 10/07/2020   Vitamin D deficiency 10/07/2020   Bilateral elbow joint pain 03/17/2020   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, severe stage 01/06/2020   Pain of left hip joint 06/12/2019   Pain in joint of left shoulder  05/03/2018   Osteoarthritis of knee 12/28/2017   Upper airway cough syndrome 07/24/2017   Coronary artery disease 11/12/2015   Hyperlipidemia 11/12/2015   TIA (transient ischemic attack)    NSTEMI (non-ST elevated myocardial infarction) ([place]) 08/18/2015   History of CVA (cerebrovascular accident) 09/26/2012   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 09/26/2012   UTI (lower urinary tract infection) 09/26/2012   Hyperkalemia 09/26/2012   CKD (chronic kidney disease), stage III ([place]) 09/26/2012   Essential hypertension     Orientation RESPIRATION BLADDER Height & Weight     Self, Time, Situation, Place  Normal Incontinent Weight: 138 lb 10.7 oz (62.9 kg) Height:  5' (152.4 cm)  BEHAVIORAL SYMPTOMS/MOOD NEUROLOGICAL BOWEL NUTRITION STATUS      Continent Diet (Heart Healthy)  AMBULATORY STATUS COMMUNICATION OF NEEDS Skin   Limited Assist Verbally Skin abrasions (skin tear Arm anterior; Right; upper and right thigh)                       Personal Care Assistance Level of Assistance  Bathing, Feeding, Dressing Bathing Assistance: Limited assistance Feeding assistance: Independent Dressing Assistance: Limited assistance     Functional Limitations Info  Sight, Hearing, Speech Sight Info: Adequate Hearing Info: Adequate Speech Info: Adequate    SPECIAL CARE FACTORS FREQUENCY  PT (By licensed PT), OT (By licensed OT)     PT Frequency: 5 x a week OT Frequency: 5 x a week            Contractures Contractures Info: Not present    Additional Factors Info  Code Status, Allergies Code Status Info: DNR Allergies Info: Meloxicam,Nitrofurantoin,Guaifenesin Er,Nitrofuran Derivatives  Current Medications (10/26/2022):  This is the current hospital active medication list Current Facility-Administered Medications  Medication Dose Route Frequency Provider Last Rate Last Admin   acetaminophen (TYLENOL) tablet 650 mg  650 mg Oral Q6H PRN Reubin Milan, MD   650 mg at  10/26/22 1005   Or   acetaminophen (TYLENOL) suppository 650 mg  650 mg Rectal Q6H PRN Reubin Milan, MD       amLODipine (NORVASC) tablet 2.5 mg  2.5 mg Oral Daily Reubin Milan, MD   2.5 mg at 10/26/22 1005   aspirin chewable tablet 81 mg  81 mg Oral Daily Reubin Milan, MD   81 mg at 10/26/22 1005   atorvastatin (LIPITOR) tablet 80 mg  80 mg Oral q1800 Reubin Milan, MD   80 mg at 10/25/22 1721   brimonidine (ALPHAGAN) 0.2 % ophthalmic solution 1 drop  1 drop Both Eyes BID Reubin Milan, MD   1 drop at 10/26/22 1008   cephALEXin (KEFLEX) capsule 500 mg  500 mg Oral Q12H [name] [place], DO   500 mg at 10/26/22 1005   diclofenac Sodium (VOLTAREN) 1 % topical gel 2 g  2 g Topical QID Sheikh, Omair [place], DO   2 g at 10/26/22 1007   famotidine (PEPCID) tablet 20 mg  20 mg Oral Daily Reubin Milan, MD   20 mg at 10/26/22 1005   furosemide (LASIX) tablet 20 mg  20 mg Oral Daily Eugenie Filler, MD   20 mg at 10/26/22 1005   ipratropium (ATROVENT) nebulizer solution 0.5 mg  0.5 mg Nebulization Q6H PRN Alfredia Ferguson, Omair Latif, DO       levalbuterol [facility]) nebulizer solution 0.63 mg  0.63 mg Nebulization Q6H PRN Sheikh, Omair Latif, DO       Loteprednol Etabonate 0.38 % GEL 1 drop  1 drop Right Eye BID Reubin Milan, MD       mirabegron ER [facility]) tablet 25 mg  25 mg Oral Daily Reubin Milan, MD   25 mg at 10/26/22 1016   polyethylene glycol 0.4% and propylene glycol 0.3% (SYSTANE) ophthalmic gel   Both Eyes BID PRN Reubin Milan, MD       prochlorperazine (COMPAZINE) injection 5 mg  5 mg Intravenous Q4H PRN Reubin Milan, MD   5 mg at 10/22/22 0120     Discharge Medications: Please see discharge summary for a list of discharge medications.  Relevant Imaging Results:  Relevant Lab Results:   Additional Information SSN:[id number]  [place], LCSW

## 2022-10-26 NOTE — Consult Note (Signed)
Reason for Consult:Right knee pain Referring Physician: Hospitalist  [name] is an 89 y.o. female.  HPI: This is a very pleasant 89 year old female who is very known to our practice at [facility].  She is a patient of Dr. Maureen Ralphs who comes in every 3 months or so for a right knee cortisone injection due to end-stage right knee osteoarthritis.  She was admitted on January 12 for hypothermia and other comorbid conditions.  She has been being treated here at the hospital.  She was working physical therapy and Occupational Therapy when her right knee started bothering her.  She does have a history of right knee osteoarthritis with cortisone injections every 3 months.  Her last cortisone injection was about 3 months ago.  She has been unable to get up with PT due to the pain in the knee.  They are planning on sending her to a rehab facility but they would like for her to get up so that she is able to ambulate.  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 ([place])    Fall at home 09/26/2012   Glaucoma    OU   Gout, joint    Hypertension    Hypertensive retinopathy    OU   Macular degeneration    OU   NSTEMI (non-ST elevated myocardial infarction) ([place]) 08/2015   Stroke [facility])     Past Surgical History:  Procedure Laterality Date   ABDOMINAL HYSTERECTOMY     BREAST SURGERY     CYST   CARDIAC CATHETERIZATION N/A 08/18/2015   Procedure: Left Heart Cath and Coronary Angiography;  Surgeon: Troy Sine, MD;  Location: [place] [facility];  Service: Cardiovascular;  Laterality: N/A;   CARDIAC CATHETERIZATION  08/18/2015   Procedure: Coronary Stent Intervention;  Surgeon: Troy Sine, MD;  Location: [place] [facility];  Service: Cardiovascular;;   CATARACT EXTRACTION Bilateral    DILATION AND CURETTAGE OF UTERUS     EYE SURGERY Bilateral    HERNIA REPAIR     IRIDOTOMY / IRIDECTOMY Right     JOINT REPLACEMENT  left knee   REFRACTIVE SURGERY Right 2017    Family History  Problem Relation Age of Onset   Heart disease Mother    Stroke Mother     Social History:  reports that she has never smoked. She has never used smokeless tobacco. She reports that she does not drink alcohol and does not use drugs.  Allergies:  Allergies  Allergen Reactions   Meloxicam Hives   Nitrofurantoin    Other Other (See Comments)   Guaifenesin Er Rash    Other reaction(s): rash   Nitrofuran Derivatives Rash    Medications: I have reviewed the patient's current medications.  Results for orders placed or performed during the hospital encounter of 10/20/22 (from the past 48 hour(s))  CBC with Differential/Platelet     Status: Abnormal   Collection Time: 10/25/22  4:15 AM  Result Value Ref Range   WBC 12.3 (H) 4.0 - 10.5 K/uL   RBC 3.23 (L) 3.87 - 5.11 MIL/uL   Hemoglobin 8.7 (L) 12.0 - 15.0 g/dL   HCT 28.6 (L) 36.0 - 46.0 %   MCV 88.5 80.0 - 100.0 fL   MCH 26.9 26.0 - 34.0 pg   MCHC 30.4 30.0 - 36.0 g/dL   RDW 16.1 (H) 11.5 - 15.5 %   Platelets  170 150 - 400 K/uL   nRBC 0.0 0.0 - 0.2 %   Neutrophils Relative % 77 %   Neutro Abs 9.5 (H) 1.7 - 7.7 K/uL   Lymphocytes Relative 12 %   Lymphs Abs 1.4 0.7 - 4.0 K/uL   Monocytes Relative 9 %   Monocytes Absolute 1.1 (H) 0.1 - 1.0 K/uL   Eosinophils Relative 1 %   Eosinophils Absolute 0.1 0.0 - 0.5 K/uL   Basophils Relative 0 %   Basophils Absolute 0.0 0.0 - 0.1 K/uL   Immature Granulocytes 1 %   Abs Immature Granulocytes 0.14 (H) 0.00 - 0.07 K/uL    Comment: Performed at [facility], [place] [address], [place], [place] [address]  Comprehensive metabolic panel     Status: Abnormal   Collection Time: 10/25/22  4:15 AM  Result Value Ref Range   Sodium 138 135 - 145 mmol/L   Potassium 4.7 3.5 - 5.1 mmol/L   Chloride 105 98 - 111 mmol/L   CO2 26 22 - 32 mmol/L   Glucose, Bld 113 (H) 70 - 99 mg/dL    Comment: Glucose  reference range applies only to samples taken after fasting for at least 8 hours.   BUN 59 (H) 8 - 23 mg/dL   Creatinine, Ser 1.95 (H) 0.44 - 1.00 mg/dL   Calcium 8.7 (L) 8.9 - 10.3 mg/dL   Total Protein 5.1 (L) 6.5 - 8.1 g/dL   Albumin 2.1 (L) 3.5 - 5.0 g/dL   AST 18 15 - 41 U/L   ALT 13 0 - 44 U/L   Alkaline Phosphatase 77 38 - 126 U/L   Total Bilirubin 0.6 0.3 - 1.2 mg/dL   GFR, Estimated 23 (L) >60 mL/min    Comment: (NOTE) Calculated using the CKD-EPI Creatinine Equation (2021)    Anion gap 7 5 - 15    Comment: Performed at [facility], [place] [address] [place], [place] [address]  Phosphorus     Status: None   Collection Time: 10/25/22  4:15 AM  Result Value Ref Range   Phosphorus 4.4 2.5 - 4.6 mg/dL    Comment: Performed at [facility], [place] [address] [place], [place] [address]  Magnesium     Status: None   Collection Time: 10/25/22  4:15 AM  Result Value Ref Range   Magnesium 1.7 1.7 - 2.4 mg/dL    Comment: Performed at [facility], [place] [address], [place], [place] [address]  Vitamin B12     Status: None   Collection Time: 10/25/22  4:15 AM  Result Value Ref Range   Vitamin B-12 357 180 - 914 pg/mL    Comment: (NOTE) This assay is not validated for testing neonatal or myeloproliferative syndrome specimens for Vitamin B12 levels. Performed at [facility], [place] [address] [place],  [address]   Folate     Status: None   Collection Time: 10/25/22  4:15 AM  Result Value Ref Range   Folate 6.7 >5.9 ng/mL    Comment: Performed at [facility], [place] [address], [place], [place] [address]  Iron and TIBC     Status: None   Collection Time: 10/25/22  4:15 AM  Result Value Ref Range   Iron 36 28 - 170 ug/dL   TIBC 305 250 - 450 ug/dL   Saturation Ratios 12 10.4 - 31.8 %   UIBC 269 ug/dL    Comment: Performed at [facility], [address]  [place], [place] [address]  Ferritin     Status: None   Collection Time: 10/25/22  4:15 AM  Result Value Ref Range   Ferritin 37 11 - 307 ng/mL    Comment: Performed at [facility], [address] [place], [place] [address]  Reticulocytes     Status: Abnormal   Collection Time: 10/25/22  4:15 AM  Result Value Ref Range   Retic Ct Pct 2.0 0.4 - 3.1 %   RBC. 3.22 (L) 3.87 - 5.11 MIL/uL   Retic Count, Absolute 65.0 19.0 - 186.0 K/uL   Immature Retic Fract 26.4 (H) 2.3 - 15.9 %    Comment: Performed at [facility], [address] [place], [place] [address]  Comprehensive metabolic panel     Status: Abnormal   Collection Time: 10/26/22  4:27 AM  Result Value Ref Range   Sodium 139 135 - 145 mmol/L   Potassium 5.1 3.5 - 5.1 mmol/L   Chloride 105 98 - 111 mmol/L   CO2 26 22 - 32 mmol/L   Glucose, Bld 112 (H) 70 - 99 mg/dL    Comment: Glucose reference range applies only to samples taken after fasting for at least 8 hours.   BUN 61 (H) 8 - 23 mg/dL   Creatinine, Ser 4.88 (H) 0.44 - 1.00 mg/dL   Calcium 8.9 8.9 - 89.1 mg/dL   Total Protein 5.4 (L) 6.5 - 8.1 g/dL   Albumin 2.3 (L) 3.5 - 5.0 g/dL   AST 22 15 - 41 U/L   ALT 15 0 - 44 U/L   Alkaline Phosphatase 80 38 - 126 U/L   Total Bilirubin 0.7 0.3 - 1.2 mg/dL   GFR, Estimated 28 (L) >60 mL/min    Comment: (NOTE) Calculated using the CKD-EPI Creatinine Equation (2021)    Anion gap 8 5 - 15    Comment: Performed at [facility], [address] [place], [place] [address]  CBC with Differential/Platelet     Status: Abnormal   Collection Time: 10/26/22  4:27 AM  Result Value Ref Range   WBC 12.8 (H) 4.0 - 10.5 K/uL   RBC 3.33 (L) 3.87 - 5.11 MIL/uL   Hemoglobin 8.9 (L) 12.0 - 15.0 g/dL   HCT 38.8 (L) 82.8 - 00.3 %   MCV 89.2 80.0 - 100.0 fL   MCH 26.7 26.0 - 34.0 pg   MCHC 30.0 30.0 - 36.0 g/dL   RDW 49.1 (H) 79.1 - 50.5 %   Platelets 177 150 - 400 K/uL   nRBC 0.0 0.0 - 0.2  %   Neutrophils Relative % 77 %   Neutro Abs 9.9 (H) 1.7 - 7.7 K/uL   Lymphocytes Relative 11 %   Lymphs Abs 1.4 0.7 - 4.0 K/uL   Monocytes Relative 10 %   Monocytes Absolute 1.3 (H) 0.1 - 1.0 K/uL   Eosinophils Relative 1 %   Eosinophils Absolute 0.2 0.0 - 0.5 K/uL   Basophils Relative 0 %   Basophils Absolute 0.0 0.0 - 0.1 K/uL   Immature Granulocytes 1 %   Abs Immature Granulocytes 0.13 (H) 0.00 - 0.07 K/uL    Comment: Performed at [facility], [address], [place], [place] [address]  Phosphorus     Status: None   Collection Time: 10/26/22  4:27 AM  Result Value Ref Range   Phosphorus 3.9 2.5 - 4.6 mg/dL    Comment: Performed at  [facility], [address], [place], [place] [address]  Magnesium     Status: Abnormal   Collection Time: 10/26/22  4:27 AM  Result Value Ref Range   Magnesium 2.6 (H) 1.7 - 2.4 mg/dL    Comment: Performed at [facility], [address] [place], [place] [address]    DG Knee 1-2 Views Right  Result Date: 10/25/2022 CLINICAL DATA:  Right knee pain EXAM: RIGHT KNEE - 2 VIEW COMPARISON:  None Available. FINDINGS: No evidence of fracture, dislocation, or joint effusion. Moderate patellofemoral compartment and mild medial and lateral compartment degenerative changes. Diffuse demineralization. Chondrocalcinosis. Vascular calcifications. Soft tissues are unremarkable. IMPRESSION: 1. Moderate patellofemoral compartment and mild medial and lateral compartment degenerative changes. 2. Chondrocalcinosis. Electronically Signed   By: Allegra Lai M.D.   On: 10/25/2022 12:07    Review of Systems Blood pressure 130/70, pulse (!) 115, temperature 98.5 F (36.9 C), temperature source Oral, resp. rate 19, height 5' (1.524 m), weight 62.9 kg, SpO2 94 %. Physical Exam Patient is very sleepy upon my arrival, she is alert to person.  She was able to tell me that she has right knee pain.  Musculoskeletal: On  exam of her right knee very sensitive to palpation.  Is a sizable effusion noted of the right knee joint.  Very limited range of motion.  Calf is supple.  Able to move toes.  Good cap refill   X-rays that were taken a few days ago were reviewed by myself in the office, 2 views AP and lateral.  X-rays show end-stage tricompartmental osteoarthritis  Assessment/Plan: Right knee osteoarthritis: She normally receives cortisone injections every 3 months in our office.  I did check our office notes in the last time she received a cortisone injection was in October 2023.  It has been about 3 months since she received her last cortisone injection.  I obtained verbal consent from the patient today.  She is agreeable.  I was able to inject her with 1 cc of Kenalog and 3 cc of Marcaine.  Injection was brought to my office.  She tolerated the injection well.  She is okay to be discharged from an orthopedic standpoint.  She can follow-up in the office as an as-needed basis.  I did check with Dr. Tana Felts team just to make sure that they were okay with me giving this. I received their permission.   We appreciate this consult.  Cherie Dark, PA-C [facility] with Dr. Thomasena Edis [phone] 10/26/2022, 11:57 AM

## 2022-10-27 ENCOUNTER — Inpatient Hospital Stay (HOSPITAL_COMMUNITY): Payer: Medicare Other

## 2022-10-27 DIAGNOSIS — I5033 Acute on chronic diastolic (congestive) heart failure: Secondary | ICD-10-CM | POA: Diagnosis not present

## 2022-10-27 DIAGNOSIS — I10 Essential (primary) hypertension: Secondary | ICD-10-CM | POA: Diagnosis not present

## 2022-10-27 DIAGNOSIS — N1832 Chronic kidney disease, stage 3b: Secondary | ICD-10-CM | POA: Diagnosis not present

## 2022-10-27 DIAGNOSIS — H409 Unspecified glaucoma: Secondary | ICD-10-CM | POA: Diagnosis not present

## 2022-10-27 LAB — CBC WITH DIFFERENTIAL/PLATELET
Abs Immature Granulocytes: 0.11 10*3/uL — ABNORMAL HIGH (ref 0.00–0.07)
Basophils Absolute: 0 10*3/uL (ref 0.0–0.1)
Basophils Relative: 0 %
Eosinophils Absolute: 0 10*3/uL (ref 0.0–0.5)
Eosinophils Relative: 0 %
HCT: 32.2 % — ABNORMAL LOW (ref 36.0–46.0)
Hemoglobin: 9.8 g/dL — ABNORMAL LOW (ref 12.0–15.0)
Immature Granulocytes: 1 %
Lymphocytes Relative: 8 %
Lymphs Abs: 0.9 10*3/uL (ref 0.7–4.0)
MCH: 27.1 pg (ref 26.0–34.0)
MCHC: 30.4 g/dL (ref 30.0–36.0)
MCV: 89.2 fL (ref 80.0–100.0)
Monocytes Absolute: 0.7 10*3/uL (ref 0.1–1.0)
Monocytes Relative: 6 %
Neutro Abs: 9.2 10*3/uL — ABNORMAL HIGH (ref 1.7–7.7)
Neutrophils Relative %: 85 %
Platelets: 198 10*3/uL (ref 150–400)
RBC: 3.61 MIL/uL — ABNORMAL LOW (ref 3.87–5.11)
RDW: 16.4 % — ABNORMAL HIGH (ref 11.5–15.5)
WBC: 11 10*3/uL — ABNORMAL HIGH (ref 4.0–10.5)
nRBC: 0 % (ref 0.0–0.2)

## 2022-10-27 LAB — BASIC METABOLIC PANEL
Anion gap: 7 (ref 5–15)
BUN: 59 mg/dL — ABNORMAL HIGH (ref 8–23)
CO2: 23 mmol/L (ref 22–32)
Calcium: 9 mg/dL (ref 8.9–10.3)
Chloride: 110 mmol/L (ref 98–111)
Creatinine, Ser: 1.55 mg/dL — ABNORMAL HIGH (ref 0.44–1.00)
GFR, Estimated: 30 mL/min — ABNORMAL LOW (ref >60)
Glucose, Bld: 133 mg/dL — ABNORMAL HIGH (ref 70–99)
Potassium: 4.9 mmol/L (ref 3.5–5.1)
Sodium: 140 mmol/L (ref 135–145)

## 2022-10-27 LAB — BRAIN NATRIURETIC PEPTIDE: B Natriuretic Peptide: 699.9 pg/mL — ABNORMAL HIGH (ref 0.0–100.0)

## 2022-10-27 LAB — MAGNESIUM: Magnesium: 2.5 mg/dL — ABNORMAL HIGH (ref 1.7–2.4)

## 2022-10-27 MED ORDER — METOPROLOL TARTRATE 25 MG PO TABS
12.5000 mg | ORAL_TABLET | Freq: Two times a day (BID) | ORAL | Status: DC
  Administered 2022-10-27 – 2022-10-31 (×9): 12.5 mg via ORAL
  Filled 2022-10-27 (×10): qty 1

## 2022-10-27 MED ORDER — FUROSEMIDE 10 MG/ML IJ SOLN
20.0000 mg | Freq: Two times a day (BID) | INTRAMUSCULAR | Status: AC
  Administered 2022-10-27 – 2022-10-29 (×4): 20 mg via INTRAVENOUS
  Filled 2022-10-27 (×4): qty 2

## 2022-10-27 MED ORDER — BENZONATATE 100 MG PO CAPS
100.0000 mg | ORAL_CAPSULE | Freq: Three times a day (TID) | ORAL | Status: DC | PRN
  Administered 2022-10-27: 100 mg via ORAL
  Filled 2022-10-27: qty 1

## 2022-10-27 MED ORDER — MELATONIN 3 MG PO TABS
6.0000 mg | ORAL_TABLET | Freq: Once | ORAL | Status: AC
  Administered 2022-10-27: 6 mg via ORAL
  Filled 2022-10-27: qty 2

## 2022-10-27 NOTE — TOC Progression Note (Signed)
Transition of Care [facility]) - Progression Note    Patient Details  Name: [name] MRN: [id number] Date of Birth: Sep 15, 1923  Transition of Care [facility]) CM/SW [place], LCSW Phone Number: 10/27/2022, 3:39 PM  Clinical Narrative:    CSW presented pt's daughter with bed offers, she is requesting time to review. TOC to follow.   Expected Discharge Plan: [place] Barriers to Discharge: Continued Medical Work up  Expected Discharge Plan and Services In-house Referral: Clinical Social Work     Living arrangements for the past 2 months: Single Family Home                                       Social Determinants of Health (SDOH) Interventions SDOH Screenings   Food Insecurity: No Food Insecurity (10/21/2022)  Housing: Low Risk  (10/21/2022)  Transportation Needs: No Transportation Needs (10/21/2022)  Utilities: Not At Risk (10/21/2022)  Tobacco Use: Low Risk  (10/20/2022)    Readmission Risk Interventions    01/17/2022    3:24 PM  Readmission Risk Prevention Plan  Transportation Screening Complete  PCP or Specialist Appt within 3-5 Days Complete  Palliative Care Screening Not Applicable

## 2022-10-27 NOTE — Progress Notes (Signed)
PROGRESS NOTE    [name]  VOJ:[id number] DOB: 1923-03-19 DOA: 10/20/2022 PCP: [name], DO    Chief Complaint  Patient presents with   Leg Swelling   Shortness of Breath    Brief Narrative:  HPI per Dr. Tennis Must on 10/21/21 [name] is a 89 y.o. 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   No chest pain, palpitations, diaphoresis, PND or orthopnea No fever, chills or night sweats. No sore throat, rhinorrhea, dyspnea, wheezing or hemoptysis  No appetite changes, abdominal pain, diarrhea, constipation, melena or hematochezia.  No flank pain, dysuria, frequency or hematuria.  No polyuria, polydipsia, polyphagia or blurred vision.     The patient was hypothermic on arrival with a temperature of 91.1 F.  She was transferred to the stepdown unit.  Urine analysis showed pyuria, but no bacteriuria.  Given hypothermia and history of ESBL UTI, urine culture and sensitivity order followed by meropenem 500 mg twice a day IVPB.   ED course: Initial vital signs were temperature 97.6 F, pulse 54, respirations 16, BP 131/95 mmHg O2 sat 100% on room air.  The patient is now on nasal cannula oxygen at 2 LPM most recent O2 sat is 100%.  She received furosemide 60 mg IVP and her home meds were ordered in the ED including metoprolol.   Lab work: Coronavirus, RSV and influenza PCR was negative.  CBC is her white count 9.2, hemoglobin 9.4 g/dL platelets 255.  Troponin x 2 normal.  BNP 580.4 pg/mL.  CMP showed a potassium of 5.4 mmol/L, glucose 111, BUN 63 and creatinine 1.51 mg/dL.  The rest of the CMP measurements were normal.  Baseline creatinine in  the last 2 years usually ranges between 1.2 and 1.6 mg/deciliter.   Imaging: Portable 1 view chest radiograph with a new right hilar/perihilar masslike density measuring 4.0 x 4.2 cm concerning for malignancy.  There is a new small right pleural effusion.  CT chest with contrast with marked enlargement of the main pulmonary artery and right pulmonary artery compatible with pulmonary hypertension.  No hilar mass identified.  Small bilateral pleural effusions with bibasilar compressive atelectasis.  Central peribronchial wall thickening that may be infectious or inflammatory.  Cardiomegaly.  1 cm incidental left thyroid nodule with no follow-up recommended.  Patient noted to be volume overloaded being diuresed with IV Lasix requiring oxygen needed to be weaned.  Renal function noted to be elevated in setting of diuresis and as such IV Lasix held.   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  Moderate tricuspid valvular regurgitation, tricuspid aortic valve with moderate thickening of aortic valve no evidence of aortic  stenosis aortic valve sclerosis/calcification present. -Patient diuresed with IV Lasix with urine output of 1.050 L on 09/25/2023.  -Patient currently on oral Lasix urine output recorded as 300 cc.  -Patient noted to be -6.9 L during this hospitalization.  -Continue oral Lasix.  -Follow.  2.   Hypothermia, likely secondary to Klebsiella pneumoniae E. Coli -Patient noted to be hypothermic on admission, TSH within normal limits. -Chest x-ray concerning for cardiomegaly and edema. -Urinalysis consistent with UTI, urine cultures positive for Klebsiella pneumoniae. -Patient was on IV Merrem, and subsequently transitioned to Keflex to complete course of antibiotic treatment. -Supportive care.  3.  Leukocytosis -Likely secondary to Klebsiella pneumonia E. coli. -Patient with no respiratory symptoms. -Leukocytosis trending down. -Was on IV meropenem and has been transition to Keflex.  4.  Right knee pain -Patient with end-stage osteoarthritis of the right knee receiving steroid injections every 3 months in the outpatient setting per orthopedics. -Plain films of the right knee ordered that showed moderate patellofemoral compartment and mild medial and lateral compartment degenerative changes, chondrocalcinosis. -Orthopedics consulted who assessed patient on 08/27/2023, patient received a steroid injection of 1 cc Kenalog and 3 cc Marcaine. -PT/OT. -Outpatient follow-up with orthopedics.  5.  Primary pulmonary hypertension -2D echo done with severely elevated pulmonary artery systolic pressure, tricuspid regurgitant velocity 3.43 m/s and resume right atrial pressure 15 mmHg with estimated right ventricular systolic pressure 18.8 mmHg. -CT chest done with marked enlargement of main pulmonary artery and right pulmonary artery compatible with pulmonary artery hypertension.  No hilar mass noted.  Small bilateral pleural effusions with bibasilar compressive atelectasis.  Central bronchial wall thickening may be infectious/inflammatory.  Cardiomegaly.  1 cm incidental left thyroid nodule.  No follow-up imaging recommended. -IV Lasix held, placed on oral Lasix. -Outpatient follow-up.  6.  Hypertension -Was on IV Lasix which was held due to bump in creatinine. -Was on metoprolol which was held  due to bradycardia and acute CHF exacerbation.  -Continue Norvasc 2.5 mg daily, continue Lasix 20 mg daily.   -Resume Lopressor at a lower dose of 12.5 mg twice daily and uptitrate as needed.   7.  CKD stage IIIb -IV diuretics held due to bump in creatinine. -Renal function trending back down. -Continue oral Lasix.    8.  Hyperlipidemia -Continue statin.   9.  GERD Continue Pepcid.  10.  Hemiplegia of nondominant side as late effect of CVA -PT/OT recommending SNF at this time.  11.  Iron deficiency anemia -Hemoglobin stable at 9.8.Marland Kitchen -Follow H&H. -Transfusion threshold hemoglobin < 7.  12.  Glaucoma,- -continue home regimen eyedrops.  13.  Hyperkalemia -Status post Lasix and Lokelma with improvement. -IV Lasix held. -Continue oral Lasix 20 mg daily.  14.  Hypoalbuminemia -Follow.  15.  Venous insufficiency and neuropathic ulcerations -Patient seen by [place] RN. -Continue Unna boot with changes every Monday and Thursday until outpatient follow-up with Dr. Amalia Hailey.  64.  Confusion/cough -Per daughter patient with some confusion over the past 2 days. -Patient on antibiotics being treated for UTI. -Check a chest x-ray due to coughing to rule out aspiration. -SLP evaluation for swallow and cognitive. -Tessalon Perles as needed.   DVT prophylaxis: SCDs Code Status: DNR Family Communication: Updated patient and daughter at bedside.  Disposition: SNF when bed available hopefully in the next 24 to 48 hours.  Status is: Inpatient Remains inpatient appropriate because: Severity of illness   Consultants:  Orthopedics: Elizabeth Sauer, [place] 10/26/2022 Wound care, RN Phineas Douglas, RN 10/23/2022  Procedures:  CT chest 10/20/2022 Chest x-ray 10/20/2022, 10/21/2022, 10/23/2022, 10/24/2022 Plain films of the right knee 10/25/2022 2D echo 10/22/2022 Steroid injection right knee per orthopedics: Genia Harold, PA 10/26/2022  Antimicrobials:  Anti-infectives (From admission, onward)    Start      Dose/Rate Route Frequency Ordered Stop   10/25/22 1115  cephALEXin (KEFLEX) capsule 500 mg        500 mg Oral Every 12 hours 10/25/22 1018     10/21/22 2000  cefTRIAXone (ROCEPHIN) 1 g in sodium chloride 0.9 % 100 mL IVPB  Status:  Discontinued        1 g 200 mL/hr over 30 Minutes Intravenous Every 24 hours 10/21/22 1859 10/21/22 1906   10/21/22 1915  meropenem (MERREM) 1,000 mg in sodium chloride 0.9 % 100 mL IVPB  Status:  Discontinued       Note to Pharmacy: Adjust dose as needed.  History of ESBL UTIs presenting with hypothermia and pyuria on urine analysis.   1,000 mg 200 mL/hr over 30 Minutes Intravenous Every 12 hours 10/21/22 1906 10/25/22 1018         Subjective: Sitting up in bed.  Granddaughter at bedside patient with some bouts of confusion over the past 2 days.  Patient with coughing noted.  Patient denies any shortness of breath.  No chest pain.  Tolerating current diet.    Objective: Vitals:   10/26/22 2309 10/26/22 2358 10/27/22 0539 10/27/22 1148  BP: (!) 154/71 139/79 (!) 163/84 (!) 154/81  Pulse: (!) 115 (!) 105 (!) 110 (!) 102  Resp: 19     Temp: 97.7 F (36.5 C) 97.7 F (36.5 C) 98.5 F (36.9 C)   TempSrc: Oral Oral Oral   SpO2: 93% 95%    Weight:   61.4 kg   Height:        Intake/Output Summary (Last 24 hours) at 10/27/2022 1211 Last data filed at 10/26/2022 2031 Gross per 24 hour  Intake 120 ml  Output 300 ml  Net -180 ml    Filed Weights   10/25/22 0500 10/26/22 0514 10/27/22 0539  Weight: 62.5 kg 62.9 kg 61.4 kg    Examination:  General exam: NAD. Respiratory system: Minimal expiratory wheezing.  No crackles.  No rhonchi.  Fair air movement.   Cardiovascular system: RRR with  3/6 SEM left upper sternal border.  No JVD, murmurs, rubs, gallops or clicks. No pedal edema. Gastrointestinal system: Abdomen is nondistended, soft and nontender. No organomegaly or masses felt. Normal bowel sounds heard. Central nervous system: Alert and oriented.  No focal neurological deficits. Extremities: Symmetric 5 x 5 power.  Lower extremities in Unna boots. Skin: No rashes, lesions or ulcers Psychiatry: Judgement and insight appear normal. Mood & affect appropriate.     Data Reviewed: I have personally reviewed following labs and imaging studies  CBC: Recent Labs  Lab 10/23/22 0442 10/24/22 0443 10/25/22 0415 10/26/22 0427 10/27/22 0953  WBC 10.2 12.3* 12.3* 12.8* 11.0*  NEUTROABS 7.6 9.6* 9.5* 9.9* 9.2*  HGB 8.9* 9.2* 8.7* 8.9* 9.8*  HCT 29.5* 30.2* 28.6* 29.7* 32.2*  MCV 89.7 88.8 88.5 89.2 89.2  PLT 198 173 170 177 198     Basic Metabolic Panel: Recent Labs  Lab 10/21/22 1455 10/22/22 0301 10/23/22 0442 10/24/22 0443 10/25/22 0415 10/26/22 0427 10/27/22 0953  NA 140   < > 140 137 138 139 140  K 5.2*   < > 5.1 5.1 4.7 5.1 4.9  CL 108   < > 103 102  105 105 110  CO2 25   < > 26 26 26 26 23   GLUCOSE 94   < > 95 104* 113* 112* 133*  BUN 61*   < > 60* 58* 59* 61* 59*  CREATININE 1.49*   < > 1.94* 1.74* 1.95* 1.67* 1.55*  CALCIUM 9.3   < > 9.2 9.0 8.7* 8.9 9.0  MG 2.1  --  1.8 1.8 1.7 2.6* 2.5*  PHOS 5.7*  --  4.8* 4.7* 4.4 3.9  --    < > = values in this interval not displayed.     GFR: Estimated Creatinine Clearance: 17 mL/min (A) (by C-G formula based on SCr of 1.55 mg/dL (H)).  Liver Function Tests: Recent Labs  Lab 10/22/22 0301 10/23/22 0442 10/24/22 0443 10/25/22 0415 10/26/22 0427  AST 27 20 20 18 22   ALT 25 20 17 13 15   ALKPHOS 91 89 77 77 80  BILITOT 0.7 0.4 0.7 0.6 0.7  PROT 5.5* 5.9* 5.5* 5.1* 5.4*  ALBUMIN 2.6* 2.5* 2.5* 2.1* 2.3*     CBG: Recent Labs  Lab 10/22/22 0616  GLUCAP 113*      Recent Results (from the past 240 hour(s))  Resp panel by RT-PCR (RSV, Flu A&B, Covid) Anterior Nasal Swab     Status: None   Collection Time: 10/20/22  6:55 PM   Specimen: Anterior Nasal Swab  Result Value Ref Range Status   SARS Coronavirus 2 by RT PCR NEGATIVE NEGATIVE Final    Comment:  (NOTE) SARS-CoV-2 target nucleic acids are NOT DETECTED.  The SARS-CoV-2 RNA is generally detectable in upper respiratory specimens during the acute phase of infection. The lowest concentration of SARS-CoV-2 viral copies this assay can detect is 138 copies/mL. A negative result does not preclude SARS-Cov-2 infection and should not be used as the sole basis for treatment or other patient management decisions. A negative result may occur with  improper specimen collection/handling, submission of specimen other than nasopharyngeal swab, presence of viral mutation(s) within the areas targeted by this assay, and inadequate number of viral copies(<138 copies/mL). A negative result must be combined with clinical observations, patient history, and epidemiological information. The expected result is Negative.  Fact Sheet for Patients:  EntrepreneurPulse.com.au  Fact Sheet for Healthcare Providers:  IncredibleEmployment.be  This test is no t yet approved or cleared by the [place] FDA and  has been authorized for detection and/or diagnosis of SARS-CoV-2 by FDA under an Emergency Use Authorization (EUA). This EUA will remain  in effect (meaning this test can be used) for the duration of the COVID-19 declaration under Section 564(b)(1) of the Act, 21 U.S.C.section 360bbb-3(b)(1), unless the authorization is terminated  or revoked sooner.       Influenza A by PCR NEGATIVE NEGATIVE Final   Influenza B by PCR NEGATIVE NEGATIVE Final    Comment: (NOTE) The Xpert Xpress SARS-CoV-2/FLU/RSV plus assay is intended as an aid in the diagnosis of influenza from Nasopharyngeal swab specimens and should not be used as a sole basis for treatment. Nasal washings and aspirates are unacceptable for Xpert Xpress SARS-CoV-2/FLU/RSV testing.  Fact Sheet for Patients: EntrepreneurPulse.com.au  Fact Sheet for Healthcare  Providers: IncredibleEmployment.be  This test is not yet approved or cleared by the [place] FDA and has been authorized for detection and/or diagnosis of SARS-CoV-2 by FDA under an Emergency Use Authorization (EUA). This EUA will remain in effect (meaning this test can be used) for the duration of the COVID-19 declaration under Section 564(b)(1)  of the Act, 21 U.S.C. section 360bbb-3(b)(1), unless the authorization is terminated or revoked.     Resp Syncytial Virus by PCR NEGATIVE NEGATIVE Final    Comment: (NOTE) Fact Sheet for Patients: BloggerCourse.com  Fact Sheet for Healthcare Providers: SeriousBroker.it  This test is not yet approved or cleared by the [place] FDA and has been authorized for detection and/or diagnosis of SARS-CoV-2 by FDA under an Emergency Use Authorization (EUA). This EUA will remain in effect (meaning this test can be used) for the duration of the COVID-19 declaration under Section 564(b)(1) of the Act, 21 U.S.C. section 360bbb-3(b)(1), unless the authorization is terminated or revoked.  Performed at [facility], [address] [place], [place] [address]   MRSA Next Gen by PCR, Nasal     Status: None   Collection Time: 10/21/22  6:49 PM   Specimen: Nasal Mucosa; Nasal Swab  Result Value Ref Range Status   MRSA by PCR Next Gen NOT DETECTED NOT DETECTED Final    Comment: (NOTE) The GeneXpert MRSA Assay (FDA approved for NASAL specimens only), is one component of a comprehensive MRSA colonization surveillance program. It is not intended to diagnose MRSA infection nor to guide or monitor treatment for MRSA infections. Test performance is not FDA approved in patients less than 58 years old. Performed at [facility], [address] [place], [place] [address]   Urine Culture     Status: Abnormal   Collection Time: 10/21/22  9:30 PM    Specimen: Urine, Catheterized  Result Value Ref Range Status   Specimen Description   Final    URINE, CATHETERIZED Performed at [facility], [address], [place], [place] [address]    Special Requests   Final    NONE Performed at [facility], [address] [place], [place] [address]    Culture >=100,000 COLONIES/mL KLEBSIELLA PNEUMONIAE (A)  Final   Report Status 10/23/2022 FINAL  Final   Organism ID, Bacteria KLEBSIELLA PNEUMONIAE (A)  Final      Susceptibility   Klebsiella pneumoniae - MIC*    AMPICILLIN >=32 RESISTANT Resistant     CEFAZOLIN <=4 SENSITIVE Sensitive     CEFEPIME <=0.12 SENSITIVE Sensitive     CEFTRIAXONE <=0.25 SENSITIVE Sensitive     CIPROFLOXACIN <=0.25 SENSITIVE Sensitive     GENTAMICIN <=1 SENSITIVE Sensitive     IMIPENEM <=0.25 SENSITIVE Sensitive     NITROFURANTOIN 128 RESISTANT Resistant     TRIMETH/SULFA >=320 RESISTANT Resistant     AMPICILLIN/SULBACTAM 16 INTERMEDIATE Intermediate     PIP/TAZO <=4 SENSITIVE Sensitive     * >=100,000 COLONIES/mL KLEBSIELLA PNEUMONIAE         Radiology Studies: No results found.      Scheduled Meds:  amLODipine  2.5 mg Oral Daily   aspirin  81 mg Oral Daily   atorvastatin  80 mg Oral q1800   brimonidine  1 drop Both Eyes BID   cephALEXin  500 mg Oral Q12H   diclofenac Sodium  2 g Topical QID   famotidine  20 mg Oral Daily   furosemide  20 mg Oral Daily   Loteprednol Etabonate  1 drop Right Eye BID   metoprolol tartrate  12.5 mg Oral BID   mirabegron ER  25 mg Oral Daily   Continuous Infusions:   LOS: 5 days    Time spent: 35 minutes    Ramiro Harvest, MD [facility]   To contact the attending provider  between 7A-7P or the covering provider during after hours 7P-7A, please log into the web site [email or web address] and access using universal [place] password for that web site. If you do not have the password, please call the hospital  operator.  10/27/2022, 12:11 PM

## 2022-10-27 NOTE — Care Management Important Message (Signed)
Important Message  Patient Details IM Letter placed in door caddy due to isolation contact precautions Name: DORRIE COCUZZA MRN: [id number] Date of Birth: Apr 07, 1923   Medicare Important Message Given:  Yes     Kerin Salen 10/27/2022, 12:49 PM

## 2022-10-27 NOTE — Plan of Care (Signed)
  Problem: Education: Goal: Knowledge of General Education information will improve Description: Including pain rating scale, medication(s)/side effects and non-pharmacologic comfort measures Outcome: Progressing   Problem: Health Behavior/Discharge Planning: Goal: Ability to manage health-related needs will improve Outcome: Progressing

## 2022-10-27 NOTE — Progress Notes (Signed)
Orthopedic Tech Progress Note Patient Details:  [name] 07/10/1923 [id number]  Patient ID: [name], female   DOB: Aug 20, 1923, 89 y.o.   MRN: [id number]  [name] 10/27/2022, 12:02 PM Bi lat unna boot applied

## 2022-10-28 DIAGNOSIS — H409 Unspecified glaucoma: Secondary | ICD-10-CM | POA: Diagnosis not present

## 2022-10-28 DIAGNOSIS — N1832 Chronic kidney disease, stage 3b: Secondary | ICD-10-CM | POA: Diagnosis not present

## 2022-10-28 DIAGNOSIS — I5033 Acute on chronic diastolic (congestive) heart failure: Secondary | ICD-10-CM | POA: Diagnosis not present

## 2022-10-28 DIAGNOSIS — I10 Essential (primary) hypertension: Secondary | ICD-10-CM | POA: Diagnosis not present

## 2022-10-28 LAB — CBC WITH DIFFERENTIAL/PLATELET
Abs Immature Granulocytes: 0.11 10*3/uL — ABNORMAL HIGH (ref 0.00–0.07)
Basophils Absolute: 0 10*3/uL (ref 0.0–0.1)
Basophils Relative: 0 %
Eosinophils Absolute: 0 10*3/uL (ref 0.0–0.5)
Eosinophils Relative: 0 %
HCT: 34.7 % — ABNORMAL LOW (ref 36.0–46.0)
Hemoglobin: 10.6 g/dL — ABNORMAL LOW (ref 12.0–15.0)
Immature Granulocytes: 1 %
Lymphocytes Relative: 10 %
Lymphs Abs: 1.2 10*3/uL (ref 0.7–4.0)
MCH: 27 pg (ref 26.0–34.0)
MCHC: 30.5 g/dL (ref 30.0–36.0)
MCV: 88.5 fL (ref 80.0–100.0)
Monocytes Absolute: 0.8 10*3/uL (ref 0.1–1.0)
Monocytes Relative: 6 %
Neutro Abs: 10.3 10*3/uL — ABNORMAL HIGH (ref 1.7–7.7)
Neutrophils Relative %: 83 %
Platelets: 258 10*3/uL (ref 150–400)
RBC: 3.92 MIL/uL (ref 3.87–5.11)
RDW: 16.4 % — ABNORMAL HIGH (ref 11.5–15.5)
WBC: 12.5 10*3/uL — ABNORMAL HIGH (ref 4.0–10.5)
nRBC: 0 % (ref 0.0–0.2)

## 2022-10-28 LAB — BASIC METABOLIC PANEL
Anion gap: 11 (ref 5–15)
BUN: 70 mg/dL — ABNORMAL HIGH (ref 8–23)
CO2: 25 mmol/L (ref 22–32)
Calcium: 9.2 mg/dL (ref 8.9–10.3)
Chloride: 103 mmol/L (ref 98–111)
Creatinine, Ser: 1.52 mg/dL — ABNORMAL HIGH (ref 0.44–1.00)
GFR, Estimated: 31 mL/min — ABNORMAL LOW (ref >60)
Glucose, Bld: 107 mg/dL — ABNORMAL HIGH (ref 70–99)
Potassium: 5.1 mmol/L (ref 3.5–5.1)
Sodium: 139 mmol/L (ref 135–145)

## 2022-10-28 LAB — AMMONIA: Ammonia: 14 umol/L (ref 9–35)

## 2022-10-28 NOTE — TOC Progression Note (Addendum)
Transition of Care [facility]) - Progression Note    Patient Details  Name: [name] MRN: [id number] Date of Birth: 07/21/1923  Transition of Care [facility]) CM/SW [place], LCSW Phone Number: 10/28/2022, 12:22 PM  Clinical Narrative:    CSW spoke with pt's daughter [name] they have chosen [facility]. CSW to start insurance auth when new PT note is place. TOC to follow    Expected Discharge Plan: [place] Barriers to Discharge: Continued Medical Work up  Expected Discharge Plan and Services In-house Referral: Clinical Social Work     Living arrangements for the past 2 months: Single Family Home                                       Social Determinants of Health (SDOH) Interventions SDOH Screenings   Food Insecurity: No Food Insecurity (10/21/2022)  Housing: Low Risk  (10/21/2022)  Transportation Needs: No Transportation Needs (10/21/2022)  Utilities: Not At Risk (10/21/2022)  Tobacco Use: Low Risk  (10/20/2022)    Readmission Risk Interventions    01/17/2022    3:24 PM  Readmission Risk Prevention Plan  Transportation Screening Complete  PCP or Specialist Appt within 3-5 Days Complete  Palliative Care Screening Not Applicable

## 2022-10-28 NOTE — Progress Notes (Signed)
Physical Therapy Treatment Patient Details Name: [name] MRN: [id number] DOB: [birth year] Today's Date: 10/28/2022   History of Present Illness Patient is a 89 yr old female admitted to the hospital with dyspnea and BLE edema. She was found to have acute on chronic diastolic CHF, hypothermia, and possible UTI. PMH. CAD, NSTEMI, anemia, OA, CVA, glaucoma, macular degeneration, gout, HTN    PT Comments    General Comments: pt was unable to arouse enough to participate.  Eyes shut.  Some maoning.  RN called into room. Daughter present during session, stated "she didn't sleep last night".   General bed mobility comments: Pt required Total Assist to transfer from supine to EOB in attempt to arouse pt however no responce.  Eyes shut.  Some moaning.  Reqyuired Total Assist to stay seated.  Therapist sat next to pt to offer full support while RN was called to room.  EOB x 7 min with NO increase in alertness.  Assisted back to supine.  Positioned to comfort.  RN took vitals.  See vital section in EPIC.  all WNL.  Pt will need ST Rehab at SNF.     Recommendations for follow up therapy are one component of a multi-disciplinary discharge planning process, led by the attending physician.  Recommendations may be updated based on patient status, additional functional criteria and insurance authorization.  Follow Up Recommendations  Skilled nursing-short term rehab (<3 hours/day) Can patient physically be transported by private vehicle: No   Assistance Recommended at Discharge    Patient can return home with the following A little help with walking and/or transfers;A little help with bathing/dressing/bathroom;Assistance with cooking/housework;Direct supervision/assist for financial management;Assist for transportation;Help with stairs or ramp for entrance   Equipment Recommendations  None recommended by PT    Recommendations for Other Services       Precautions / Restrictions  Precautions Precautions: Fall Restrictions Weight Bearing Restrictions: No Other Position/Activity Restrictions: chronic bilateral feet wounds. wears specialized shoes.  Currenly wrapped     Mobility  Bed Mobility Overal bed mobility: Needs Assistance Bed Mobility: Supine to Sit, Sit to Supine     Supine to sit: Total assist Sit to supine: Total assist   General bed mobility comments: Pt required Total Assist to transfer from supine to EOB in attempt to arouse pt however no responce.  Eyes shut.  Some moaning.  Reqyuired Total Assist to stay seated.  Therapist sat next to pt to offer full support while RN was called to room.  EOB x 7 min with NO increase in alertness.  Assisted back to supine.  RN took vitals.  See vital section in EPIC.  all WNL.    Transfers                   General transfer comment: unable to arouse enough to attempt    Ambulation/Gait                   Stairs             Wheelchair Mobility    Modified Rankin (Stroke Patients Only)       Balance                                            Cognition Arousal/Alertness: Awake/alert   Overall Cognitive Status: Impaired/Different from baseline  General Comments: pt was unable to arouse enough to participate.  Eyes shut.  Some maoning.  RN called into room.        Exercises      General Comments        Pertinent Vitals/Pain Pain Assessment Pain Assessment: 0-10    Home Living                          Prior Function            PT Goals (current goals can now be found in the care plan section) Progress towards PT goals: Progressing toward goals    Frequency    Min 3X/week      PT Plan Current plan remains appropriate    Co-evaluation              AM-PAC PT "6 Clicks" Mobility   Outcome Measure  Help needed turning from your back to your side while in a flat bed without  using bedrails?: Total Help needed moving from lying on your back to sitting on the side of a flat bed without using bedrails?: Total Help needed moving to and from a bed to a chair (including a wheelchair)?: Total Help needed standing up from a chair using your arms (e.g., wheelchair or bedside chair)?: Total Help needed to walk in hospital room?: Total Help needed climbing 3-5 steps with a railing? : Total 6 Click Score: 6    End of Session Equipment Utilized During Treatment: Gait belt Activity Tolerance: Patient limited by lethargy Patient left: in bed;with call bell/phone within reach;with bed alarm set;with family/visitor present Nurse Communication: Mobility status PT Visit Diagnosis: Muscle weakness (generalized) (M62.81);Difficulty in walking, not elsewhere classified (R26.2)     Time: 7340-3709 PT Time Calculation (min) (ACUTE ONLY): 24 min  Charges:  $Therapeutic Activity: 23-37 mins                    Rica Koyanagi  PTA Acute  Rehabilitation Services Office M-F          [phone] Weekend pager [phone]

## 2022-10-28 NOTE — Progress Notes (Signed)
PROGRESS NOTE    [name]  VOJ:[id number] DOB: 1923-03-19 DOA: 10/20/2022 PCP: [name], DO    Chief Complaint  Patient presents with   Leg Swelling   Shortness of Breath    Brief Narrative:  HPI per Dr. Tennis Must on 10/21/21 [name] is a 89 y.o. 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   No chest pain, palpitations, diaphoresis, PND or orthopnea No fever, chills or night sweats. No sore throat, rhinorrhea, dyspnea, wheezing or hemoptysis  No appetite changes, abdominal pain, diarrhea, constipation, melena or hematochezia.  No flank pain, dysuria, frequency or hematuria.  No polyuria, polydipsia, polyphagia or blurred vision.     The patient was hypothermic on arrival with a temperature of 91.1 F.  She was transferred to the stepdown unit.  Urine analysis showed pyuria, but no bacteriuria.  Given hypothermia and history of ESBL UTI, urine culture and sensitivity order followed by meropenem 500 mg twice a day IVPB.   ED course: Initial vital signs were temperature 97.6 F, pulse 54, respirations 16, BP 131/95 mmHg O2 sat 100% on room air.  The patient is now on nasal cannula oxygen at 2 LPM most recent O2 sat is 100%.  She received furosemide 60 mg IVP and her home meds were ordered in the ED including metoprolol.   Lab work: Coronavirus, RSV and influenza PCR was negative.  CBC is her white count 9.2, hemoglobin 9.4 g/dL platelets 255.  Troponin x 2 normal.  BNP 580.4 pg/mL.  CMP showed a potassium of 5.4 mmol/L, glucose 111, BUN 63 and creatinine 1.51 mg/dL.  The rest of the CMP measurements were normal.  Baseline creatinine in  the last 2 years usually ranges between 1.2 and 1.6 mg/deciliter.   Imaging: Portable 1 view chest radiograph with a new right hilar/perihilar masslike density measuring 4.0 x 4.2 cm concerning for malignancy.  There is a new small right pleural effusion.  CT chest with contrast with marked enlargement of the main pulmonary artery and right pulmonary artery compatible with pulmonary hypertension.  No hilar mass identified.  Small bilateral pleural effusions with bibasilar compressive atelectasis.  Central peribronchial wall thickening that may be infectious or inflammatory.  Cardiomegaly.  1 cm incidental left thyroid nodule with no follow-up recommended.  Patient noted to be volume overloaded being diuresed with IV Lasix requiring oxygen needed to be weaned.  Renal function noted to be elevated in setting of diuresis and as such IV Lasix held.   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  Moderate tricuspid valvular regurgitation, tricuspid aortic valve with moderate thickening of aortic valve no evidence of aortic  stenosis aortic valve sclerosis/calcification present. -Patient diuresed with IV Lasix with urine output of 1.050 L on 09/25/2023.  -Patient was transition to oral Lasix.   -Patient noted to have cough and congestion with some expiratory wheezing on 10/27/2022, workup done including chest x-ray and  BNP consistent with volume overload.   -Patient started back on Lasix 20 mg IV every 12 hours, urine output of 1.3 L over the past 24 hours.   -Patient currently -7.9 L during this hospitalization.   -Continue IV Lasix.  -Follow.  2.  Hypothermia, likely secondary to Klebsiella pneumoniae E. Coli -Patient noted to be hypothermic on admission, TSH within normal limits. -Chest x-ray concerning for cardiomegaly and edema. -Urinalysis consistent with UTI, urine cultures positive for Klebsiella pneumoniae. -Patient was on IV Merrem, and subsequently transitioned to Keflex to complete course of antibiotic treatment. -Supportive care.  3.  Leukocytosis -Likely secondary to Klebsiella pneumonia E. coli. -Patient with no respiratory symptoms. -Leukocytosis trending down. -Was on IV meropenem and has been transitioned to Keflex.  4.  Right knee pain -Patient with end-stage osteoarthritis of the right knee receiving steroid injections every 3 months in the outpatient setting per orthopedics. -Plain films of the right knee ordered that showed moderate patellofemoral compartment and mild medial and lateral compartment degenerative changes, chondrocalcinosis. -Orthopedics consulted who assessed patient on 08/27/2023, patient received a steroid injection of 1 cc Kenalog and 3 cc Marcaine. -PT/OT. -Outpatient follow-up with orthopedics.  5.  Primary pulmonary hypertension -2D echo done with severely elevated pulmonary artery systolic pressure, tricuspid regurgitant velocity 3.43 m/s and resume right atrial pressure 15 mmHg with estimated right ventricular systolic pressure 62.1 mmHg. -CT chest done with marked enlargement of main pulmonary artery and right pulmonary artery compatible with pulmonary artery hypertension.  No hilar mass noted.  Small bilateral pleural effusions with bibasilar compressive atelectasis.  Central bronchial wall thickening may be infectious/inflammatory.  Cardiomegaly.  1 cm  incidental left thyroid nodule.  No follow-up imaging recommended. -IV Lasix initially held, patient placed on oral Lasix and placed him back on IV Lasix.  -Outpatient follow-up.  6.  Hypertension -Was on IV Lasix which was held due to bump in creatinine. -Was on metoprolol which was held due to bradycardia and acute CHF exacerbation.  -Continue Norvasc 2.5 mg daily, continue low-dose Lopressor 12.5 mg twice daily.  -Patient placed back on IV Lasix.   -Follow.  7.  CKD stage IIIb -IV diuretics held due to bump in creatinine. -Renal function trending back down. -Patient with concerns for volume overload and placed back on IV Lasix.   -Monitor renal function.    8.  Hyperlipidemia -Statin.  9.  GERD Pepcid.    10.  Hemiplegia of nondominant side as late effect of CVA -PT/OT recommending SNF at this time.  11.  Iron deficiency anemia -Hemoglobin stable at 10.6. -Follow H&H. -Transfusion threshold hemoglobin < 7.  12.  Glaucoma,- -continue home regimen eyedrops.  13.  Hyperkalemia -Status post Lasix and Lokelma with improvement. -Patient resumed back on IV Lasix, potassium of 5.1 today. -Follow.  14.  Hypoalbuminemia -Follow.  15.  Venous insufficiency and neuropathic ulcerations -Patient seen by WOC RN. -Continue Unna boot with changes every Monday and Thursday until outpatient follow-up with Dr. Logan Bores.  16.  Confusion/cough likely secondary to volume overload -Per daughter patient with some confusion over the past 2 days. -Patient on antibiotics being treated for UTI. -Chest x-ray done concerning for volume overload.   -Patient placed on Lasix  20 mg IV every 12 hours with urine output of 1.3 L over the past 24 hours. -SLP evaluation for swallow and cognition, -Tessalon Perles as needed.   DVT prophylaxis: SCDs Code Status: DNR Family Communication: Updated patient and daughter at bedside.  Disposition: SNF when bed available and patient improved  clinically..  Status is: Inpatient Remains inpatient appropriate because: Severity of illness   Consultants:  Orthopedics: Zadie Cleverly, PA 10/26/2022 Wound care, RN Ria Bush, RN 10/23/2022  Procedures:  CT chest 10/20/2022 Chest x-ray 10/20/2022, 10/21/2022, 10/23/2022, 10/24/2022 Plain films of the right knee 10/25/2022 2D echo 10/22/2022 Steroid injection right knee per orthopedics: Rene Paci, PA 10/26/2022  Antimicrobials:  Anti-infectives (From admission, onward)    Start     Dose/Rate Route Frequency Ordered Stop   10/25/22 1115  cephALEXin (KEFLEX) capsule 500 mg        500 mg Oral Every 12 hours 10/25/22 1018     10/21/22 2000  cefTRIAXone (ROCEPHIN) 1 g in sodium chloride 0.9 % 100 mL IVPB  Status:  Discontinued        1 g 200 mL/hr over 30 Minutes Intravenous Every 24 hours 10/21/22 1859 10/21/22 1906   10/21/22 1915  meropenem (MERREM) 1,000 mg in sodium chloride 0.9 % 100 mL IVPB  Status:  Discontinued       Note to Pharmacy: Adjust dose as needed.  History of ESBL UTIs presenting with hypothermia and pyuria on urine analysis.   1,000 mg 200 mL/hr over 30 Minutes Intravenous Every 12 hours 10/21/22 1906 10/25/22 1018         Subjective: Seen sleeping deeply.  Daughter at bedside and states patient was up all night.  Cough is seems to be improved.   Objective: Vitals:   10/27/22 1148 10/27/22 1237 10/27/22 1924 10/28/22 0359  BP: (!) 154/81  (!) 164/79 (!) 147/74  Pulse: (!) 102  94 87  Resp:   18 18  Temp:   97.9 F (36.6 C) 98.4 F (36.9 C)  TempSrc:   Oral Oral  SpO2:  96% 98% 98%  Weight:      Height:        Intake/Output Summary (Last 24 hours) at 10/28/2022 1243 Last data filed at 10/28/2022 0749 Gross per 24 hour  Intake 120 ml  Output 2000 ml  Net -1880 ml    Filed Weights   10/25/22 0500 10/26/22 0514 10/27/22 0539  Weight: 62.5 kg 62.9 kg 61.4 kg    Examination:  General exam: NAD.  Sleepy. Respiratory system: Some coarse breath  sounds anterior lung fields.  No significant wheezing noted.  No rhonchi.  Fair air movement.  Cardiovascular system: RRR with  3/6 SEM left upper sternal border.  No JVD, murmurs, rubs, gallops or clicks. No pedal edema. Gastrointestinal system: Abdomen is soft, nontender, nondistended, positive bowel sounds.  No rebound.  No guarding.  Central nervous system: Alert and oriented. No focal neurological deficits. Extremities: Symmetric 5 x 5 power.  Lower extremities in Unna boots. Skin: No rashes, lesions or ulcers Psychiatry: Judgement and insight unable to assess today.. Mood & affect appropriate.     Data Reviewed: I have personally reviewed following labs and imaging studies  CBC: Recent Labs  Lab 10/24/22 0443 10/25/22 0415 10/26/22 0427 10/27/22 0953 10/28/22 0417  WBC 12.3* 12.3* 12.8* 11.0* 12.5*  NEUTROABS 9.6* 9.5* 9.9* 9.2* 10.3*  HGB 9.2* 8.7* 8.9* 9.8* 10.6*  HCT 30.2* 28.6* 29.7* 32.2* 34.7*  MCV 88.8 88.5 89.2 89.2  88.5  PLT 173 170 177 198 258     Basic Metabolic Panel: Recent Labs  Lab 10/21/22 1455 10/22/22 0301 10/23/22 0442 10/24/22 0443 10/25/22 0415 10/26/22 0427 10/27/22 0953 10/28/22 0417  NA 140   < > 140 137 138 139 140 139  K 5.2*   < > 5.1 5.1 4.7 5.1 4.9 5.1  CL 108   < > 103 102 105 105 110 103  CO2 25   < > 26 26 26 26 23 25   GLUCOSE 94   < > 95 104* 113* 112* 133* 107*  BUN 61*   < > 60* 58* 59* 61* 59* 70*  CREATININE 1.49*   < > 1.94* 1.74* 1.95* 1.67* 1.55* 1.52*  CALCIUM 9.3   < > 9.2 9.0 8.7* 8.9 9.0 9.2  MG 2.1  --  1.8 1.8 1.7 2.6* 2.5*  --   PHOS 5.7*  --  4.8* 4.7* 4.4 3.9  --   --    < > = values in this interval not displayed.     GFR: Estimated Creatinine Clearance: 17.3 mL/min (A) (by C-G formula based on SCr of 1.52 mg/dL (H)).  Liver Function Tests: Recent Labs  Lab 10/22/22 0301 10/23/22 0442 10/24/22 0443 10/25/22 0415 10/26/22 0427  AST 27 20 20 18 22   ALT 25 20 17 13 15   ALKPHOS 91 89 77 77 80   BILITOT 0.7 0.4 0.7 0.6 0.7  PROT 5.5* 5.9* 5.5* 5.1* 5.4*  ALBUMIN 2.6* 2.5* 2.5* 2.1* 2.3*     CBG: Recent Labs  Lab 10/22/22 0616  GLUCAP 113*      Recent Results (from the past 240 hour(s))  Resp panel by RT-PCR (RSV, Flu A&B, Covid) Anterior Nasal Swab     Status: None   Collection Time: 10/20/22  6:55 PM   Specimen: Anterior Nasal Swab  Result Value Ref Range Status   SARS Coronavirus 2 by RT PCR NEGATIVE NEGATIVE Final    Comment: (NOTE) SARS-CoV-2 target nucleic acids are NOT DETECTED.  The SARS-CoV-2 RNA is generally detectable in upper respiratory specimens during the acute phase of infection. The lowest concentration of SARS-CoV-2 viral copies this assay can detect is 138 copies/mL. A negative result does not preclude SARS-Cov-2 infection and should not be used as the sole basis for treatment or other patient management decisions. A negative result may occur with  improper specimen collection/handling, submission of specimen other than nasopharyngeal swab, presence of viral mutation(s) within the areas targeted by this assay, and inadequate number of viral copies(<138 copies/mL). A negative result must be combined with clinical observations, patient history, and epidemiological information. The expected result is Negative.  Fact Sheet for Patients:  BloggerCourse.comhttps://www.fda.gov/media/152166/download  Fact Sheet for Healthcare Providers:  SeriousBroker.ithttps://www.fda.gov/media/152162/download  This test is no t yet approved or cleared by the [place] FDA and  has been authorized for detection and/or diagnosis of SARS-CoV-2 by FDA under an Emergency Use Authorization (EUA). This EUA will remain  in effect (meaning this test can be used) for the duration of the COVID-19 declaration under Section 564(b)(1) of the Act, 21 U.S.C.section 360bbb-3(b)(1), unless the authorization is terminated  or revoked sooner.       Influenza A by PCR NEGATIVE NEGATIVE Final   Influenza B  by PCR NEGATIVE NEGATIVE Final    Comment: (NOTE) The Xpert Xpress SARS-CoV-2/FLU/RSV plus assay is intended as an aid in the diagnosis of influenza from Nasopharyngeal swab specimens and should not be used as a  sole basis for treatment. Nasal washings and aspirates are unacceptable for Xpert Xpress SARS-CoV-2/FLU/RSV testing.  Fact Sheet for Patients: EntrepreneurPulse.com.au  Fact Sheet for Healthcare Providers: IncredibleEmployment.be  This test is not yet approved or cleared by the [place] FDA and has been authorized for detection and/or diagnosis of SARS-CoV-2 by FDA under an Emergency Use Authorization (EUA). This EUA will remain in effect (meaning this test can be used) for the duration of the COVID-19 declaration under Section 564(b)(1) of the Act, 21 U.S.C. section 360bbb-3(b)(1), unless the authorization is terminated or revoked.     Resp Syncytial Virus by PCR NEGATIVE NEGATIVE Final    Comment: (NOTE) Fact Sheet for Patients: EntrepreneurPulse.com.au  Fact Sheet for Healthcare Providers: IncredibleEmployment.be  This test is not yet approved or cleared by the [place] FDA and has been authorized for detection and/or diagnosis of SARS-CoV-2 by FDA under an Emergency Use Authorization (EUA). This EUA will remain in effect (meaning this test can be used) for the duration of the COVID-19 declaration under Section 564(b)(1) of the Act, 21 U.S.C. section 360bbb-3(b)(1), unless the authorization is terminated or revoked.  Performed at [facility], [address] [place], [place] [address]   MRSA Next Gen by PCR, Nasal     Status: None   Collection Time: 10/21/22  6:49 PM   Specimen: Nasal Mucosa; Nasal Swab  Result Value Ref Range Status   MRSA by PCR Next Gen NOT DETECTED NOT DETECTED Final    Comment: (NOTE) The GeneXpert MRSA Assay (FDA approved for NASAL  specimens only), is one component of a comprehensive MRSA colonization surveillance program. It is not intended to diagnose MRSA infection nor to guide or monitor treatment for MRSA infections. Test performance is not FDA approved in patients less than 44 years old. Performed at [facility], [place] [address] [place], [place] [address]   Urine Culture     Status: Abnormal   Collection Time: 10/21/22  9:30 PM   Specimen: Urine, Catheterized  Result Value Ref Range Status   Specimen Description   Final    URINE, CATHETERIZED Performed at [facility] [address] [place], [place] [address]    Special Requests   Final    NONE Performed at [facility], [place] [address], [place], [place] [address]    Culture >=100,000 COLONIES/mL KLEBSIELLA PNEUMONIAE (A)  Final   Report Status 10/23/2022 FINAL  Final   Organism ID, Bacteria KLEBSIELLA PNEUMONIAE (A)  Final      Susceptibility   Klebsiella pneumoniae - MIC*    AMPICILLIN >=32 RESISTANT Resistant     CEFAZOLIN <=4 SENSITIVE Sensitive     CEFEPIME <=0.12 SENSITIVE Sensitive     CEFTRIAXONE <=0.25 SENSITIVE Sensitive     CIPROFLOXACIN <=0.25 SENSITIVE Sensitive     GENTAMICIN <=1 SENSITIVE Sensitive     IMIPENEM <=0.25 SENSITIVE Sensitive     NITROFURANTOIN 128 RESISTANT Resistant     TRIMETH/SULFA >=320 RESISTANT Resistant     AMPICILLIN/SULBACTAM 16 INTERMEDIATE Intermediate     PIP/TAZO <=4 SENSITIVE Sensitive     * >=100,000 COLONIES/mL KLEBSIELLA PNEUMONIAE         Radiology Studies: DG CHEST PORT 1 VIEW  Result Date: 10/27/2022 CLINICAL DATA:  15176 Wheezing 10028 EXAM: PORTABLE CHEST - 1 VIEW COMPARISON:  10/24/2022 FINDINGS: Cardiac silhouette is prominent. Hilar prominence consistent with pulmonary arterial hypertension. Right perihilar linear subsegmental atelectasis is new. Minimal bibasilar consolidation or volume loss. There is pulmonary  interstitial  prominence with vascular congestion. No pneumothorax or pleural effusion identified. IMPRESSION: Findings suggest CHF. Bibasilar minimal consolidation or volume loss. Hilar prominence suggesting pulmonary arterial hypertension. Electronically Signed   By: Layla Maw M.D.   On: 10/27/2022 12:30        Scheduled Meds:  amLODipine  2.5 mg Oral Daily   aspirin  81 mg Oral Daily   atorvastatin  80 mg Oral q1800   brimonidine  1 drop Both Eyes BID   cephALEXin  500 mg Oral Q12H   diclofenac Sodium  2 g Topical QID   famotidine  20 mg Oral Daily   furosemide  20 mg Intravenous Q12H   Loteprednol Etabonate  1 drop Right Eye BID   metoprolol tartrate  12.5 mg Oral BID   mirabegron ER  25 mg Oral Daily   Continuous Infusions:   LOS: 6 days    Time spent: 35 minutes    Ramiro Harvest, MD [facility]   To contact the attending provider between 7A-7P or the covering provider during after hours 7P-7A, please log into the web site [email or web address] and access using universal [place] password for that web site. If you do not have the password, please call the hospital operator.  10/28/2022, 12:43 PM

## 2022-10-28 NOTE — Evaluation (Addendum)
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 ([place])    Fall at home 09/26/2012   Glaucoma    OU   Gout, joint    Hypertension    Hypertensive retinopathy    OU   Macular degeneration    OU   NSTEMI (non-ST elevated myocardial infarction) ([place]) 08/2015   Stroke [facility])    Past Surgical History:  Past Surgical History:  Procedure Laterality Date   ABDOMINAL HYSTERECTOMY     BREAST SURGERY     CYST   CARDIAC CATHETERIZATION N/A 08/18/2015   Procedure: Left Heart Cath and Coronary Angiography;  Surgeon: Troy Sine, MD;  Location: [place] [facility];  Service: Cardiovascular;  Laterality: N/A;   CARDIAC CATHETERIZATION  08/18/2015   Procedure: Coronary Stent Intervention;  Surgeon: Troy Sine, MD;  Location: [place] [facility];  Service: Cardiovascular;;   CATARACT EXTRACTION Bilateral    DILATION AND CURETTAGE OF UTERUS     EYE SURGERY Bilateral    HERNIA REPAIR     IRIDOTOMY / IRIDECTOMY Right    JOINT REPLACEMENT  left knee   REFRACTIVE SURGERY Right 2017   HPI:  89 yo female adm to [facility] with respiratory deficits - found to have CHF.  Pt has h/o CVA 2013 causing dysphagia and left sided weakness, CAD, NSTEMI, anemia, OA, glaucome, gout, HTN, macular degeneration and glaucoma.  Chest imaging showed bilateral pleural effusions with bibasilar compressive ATX, small hiatal hernia.  Swallow and cognitive evaluation.    Assessment / Plan / Recommendation  Clinical Impression  Patient currently sleepy but will awaken to SLP verbal stimulation into her left ear *she is very HOH.  At rest mild left  facial asymmetry noted otherwise no focal CN defictis.  Generalized strength diminished.  Pt willing to consume some intake as she had just finished breakfast.  Baseline throat clearing noted that continued throughout intake along with delayed cough - did not appear coorelated to po.   Assisted pt to consume solids, applesauce, ensure and water.  She was able to hold her own cup - and demonstrates reflexive double swallow with 2nd swallow being delayed across all bolus types.  No overt indication of aspiration or airway compromise.  Daughter present and reports pt has had some problems swallowing - pt confirms - since she had her CVA.  1, daughter, reports pt eats rapidly and will sometime "choke", pt admits to some issues with swallowing liquids as well.  No overt difficulties noted today with minimal po pt took before becoming very fatigued and nodding off.  Educated daughter to aspiration, dysphagia mitigation strategies and encouraged po only when pt is fully alert/accepting and advised for pt to self feed to assure adequate time for reflexive dry swallows.  Further advised that pt likely inconsistently aspirates and has tolerated, prudent precaution needed.  Recommend brief follow up at [facility] for dysphagia management.  Cog eval deferred *will attempt later today if pt will wake* due to pt's lethargy at end of session.  Advised daughter that SLP suspects pt's cognitive issues likley coorelated to pt's lengthy hospital stay, UTI and current  level of illness.  Will follow up briefly regarding swallowing given pt's current mentation and dysphagia from cva in 2013/  SLP Visit Diagnosis: Dysphagia, oropharyngeal phase (R13.12);Dysphagia, unspecified (R13.10)    Aspiration Risk  Mild aspiration risk    Diet Recommendation Dysphagia 3 (Mech soft);Thin liquid   Liquid Administration via: Cup;Straw Medication Administration: Whole meds with puree Supervision: Intermittent supervision to cue for compensatory  strategies;Patient able to self feed Compensations: Slow rate;Small sips/bites;Lingual sweep for clearance of pocketing Postural Changes: Remain upright for at least 30 minutes after po intake;Seated upright at 90 degrees    Other  Recommendations Oral Care Recommendations: Oral care BID    Recommendations for follow up therapy are one component of a multi-disciplinary discharge planning process, led by the attending physician.  Recommendations may be updated based on patient status, additional functional criteria and insurance authorization.  Follow up Recommendations Skilled nursing-short term rehab (<3 hours/day)      Assistance Recommended at Discharge  full  Functional Status Assessment Patient has had a recent decline in their functional status and demonstrates the ability to make significant improvements in function in a reasonable and predictable amount of time.  Frequency and Duration min 1 x/week  1 week       Prognosis Prognosis for Safe Diet Advancement: Fair Barriers to Reach Goals: Time post onset      Swallow Study   General Date of Onset: 10/28/22 HPI: 89 yo female adm to [facility] with respiratory deficits - found to have CHF.  Pt has h/o CVA 2013 causing dysphagia and left sided weakness, CAD, NSTEMI, anemia, OA, glaucome, gout, HTN, macular degeneration and glaucoma.  Chest imaging showed bilateral pleural effusions with bibasilar compressive ATX, small hiatal hernia.  Swallow and cognitive evaluation. Type of Study: Bedside Swallow Evaluation Diet Prior to this Study: Dysphagia 3 (soft);Thin liquids Temperature Spikes Noted: No Respiratory Status: Room air History of Recent Intubation: No Behavior/Cognition: Alert;Cooperative;Pleasant mood Oral Cavity Assessment: Within Functional Limits Oral Care Completed by SLP: No Oral Cavity - Dentition: Adequate natural dentition Vision: Functional for self-feeding Self-Feeding Abilities: Able to feed self Patient  Positioning: Upright in bed Baseline Vocal Quality: Normal Volitional Cough: Weak Volitional Swallow: Able to elicit    Oral/Motor/Sensory Function Overall Oral Motor/Sensory Function: Within functional limits   Ice Chips Ice chips: Not tested   Thin Liquid Thin Liquid: Impaired Presentation: Self Fed;Cup;Spoon Pharyngeal  Phase Impairments: Suspected delayed Swallow;Multiple swallows;Throat Clearing - Immediate    Nectar Thick Nectar Thick Liquid: Impaired Presentation: Cup;Self Fed Pharyngeal Phase Impairments: Suspected delayed Swallow;Multiple swallows;Throat Clearing - Immediate   Honey Thick Honey Thick Liquid: Not tested   Puree Puree: Impaired Presentation: Spoon Pharyngeal Phase Impairments: Suspected delayed Swallow;Multiple swallows   Solid     Solid: Impaired Presentation: Self Fed Pharyngeal Phase Impairments: Multiple swallows;Throat Clearing - Immediate;Suspected delayed Swallow      Macario Golds 10/28/2022,10:48 AM  Kathleen Lime, MS [facility] SLP [place] Office [phone] Pager [phone]

## 2022-10-29 DIAGNOSIS — I5033 Acute on chronic diastolic (congestive) heart failure: Secondary | ICD-10-CM | POA: Diagnosis not present

## 2022-10-29 DIAGNOSIS — N1832 Chronic kidney disease, stage 3b: Secondary | ICD-10-CM | POA: Diagnosis not present

## 2022-10-29 DIAGNOSIS — H409 Unspecified glaucoma: Secondary | ICD-10-CM | POA: Diagnosis not present

## 2022-10-29 DIAGNOSIS — I10 Essential (primary) hypertension: Secondary | ICD-10-CM | POA: Diagnosis not present

## 2022-10-29 LAB — CBC
HCT: 38.7 % (ref 36.0–46.0)
Hemoglobin: 11.7 g/dL — ABNORMAL LOW (ref 12.0–15.0)
MCH: 27.1 pg (ref 26.0–34.0)
MCHC: 30.2 g/dL (ref 30.0–36.0)
MCV: 89.8 fL (ref 80.0–100.0)
Platelets: 241 10*3/uL (ref 150–400)
RBC: 4.31 MIL/uL (ref 3.87–5.11)
RDW: 16.3 % — ABNORMAL HIGH (ref 11.5–15.5)
WBC: 10.5 10*3/uL (ref 4.0–10.5)
nRBC: 0 % (ref 0.0–0.2)

## 2022-10-29 LAB — BASIC METABOLIC PANEL
Anion gap: 11 (ref 5–15)
BUN: 78 mg/dL — ABNORMAL HIGH (ref 8–23)
CO2: 25 mmol/L (ref 22–32)
Calcium: 9.1 mg/dL (ref 8.9–10.3)
Chloride: 103 mmol/L (ref 98–111)
Creatinine, Ser: 1.72 mg/dL — ABNORMAL HIGH (ref 0.44–1.00)
GFR, Estimated: 27 mL/min — ABNORMAL LOW (ref >60)
Glucose, Bld: 115 mg/dL — ABNORMAL HIGH (ref 70–99)
Potassium: 5.4 mmol/L — ABNORMAL HIGH (ref 3.5–5.1)
Sodium: 139 mmol/L (ref 135–145)

## 2022-10-29 LAB — GLUCOSE, CAPILLARY
Glucose-Capillary: 69 mg/dL — ABNORMAL LOW (ref 70–99)
Glucose-Capillary: 110 mg/dL — ABNORMAL HIGH (ref 70–99)

## 2022-10-29 MED ORDER — ORAL CARE MOUTH RINSE
15.0000 mL | OROMUCOSAL | Status: DC
  Administered 2022-10-29 – 2022-10-31 (×5): 15 mL via OROMUCOSAL

## 2022-10-29 MED ORDER — ORAL CARE MOUTH RINSE: 15.0000 mL | OROMUCOSAL | Status: DC | PRN

## 2022-10-29 MED ORDER — SODIUM ZIRCONIUM CYCLOSILICATE 10 G PO PACK
10.0000 g | PACK | Freq: Two times a day (BID) | ORAL | Status: AC
  Administered 2022-10-29 (×2): 10 g via ORAL
  Filled 2022-10-29 (×2): qty 1

## 2022-10-29 NOTE — Progress Notes (Signed)
PROGRESS NOTE    [name]  VOJ:[id number] DOB: 1923-03-19 DOA: 10/20/2022 PCP: [name], DO    Chief Complaint  Patient presents with   Leg Swelling   Shortness of Breath    Brief Narrative:  HPI per Dr. Tennis Must on 10/21/21 [name] is a 89 y.o. 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   No chest pain, palpitations, diaphoresis, PND or orthopnea No fever, chills or night sweats. No sore throat, rhinorrhea, dyspnea, wheezing or hemoptysis  No appetite changes, abdominal pain, diarrhea, constipation, melena or hematochezia.  No flank pain, dysuria, frequency or hematuria.  No polyuria, polydipsia, polyphagia or blurred vision.     The patient was hypothermic on arrival with a temperature of 91.1 F.  She was transferred to the stepdown unit.  Urine analysis showed pyuria, but no bacteriuria.  Given hypothermia and history of ESBL UTI, urine culture and sensitivity order followed by meropenem 500 mg twice a day IVPB.   ED course: Initial vital signs were temperature 97.6 F, pulse 54, respirations 16, BP 131/95 mmHg O2 sat 100% on room air.  The patient is now on nasal cannula oxygen at 2 LPM most recent O2 sat is 100%.  She received furosemide 60 mg IVP and her home meds were ordered in the ED including metoprolol.   Lab work: Coronavirus, RSV and influenza PCR was negative.  CBC is her white count 9.2, hemoglobin 9.4 g/dL platelets 255.  Troponin x 2 normal.  BNP 580.4 pg/mL.  CMP showed a potassium of 5.4 mmol/L, glucose 111, BUN 63 and creatinine 1.51 mg/dL.  The rest of the CMP measurements were normal.  Baseline creatinine in  the last 2 years usually ranges between 1.2 and 1.6 mg/deciliter.   Imaging: Portable 1 view chest radiograph with a new right hilar/perihilar masslike density measuring 4.0 x 4.2 cm concerning for malignancy.  There is a new small right pleural effusion.  CT chest with contrast with marked enlargement of the main pulmonary artery and right pulmonary artery compatible with pulmonary hypertension.  No hilar mass identified.  Small bilateral pleural effusions with bibasilar compressive atelectasis.  Central peribronchial wall thickening that may be infectious or inflammatory.  Cardiomegaly.  1 cm incidental left thyroid nodule with no follow-up recommended.  Patient noted to be volume overloaded being diuresed with IV Lasix requiring oxygen needed to be weaned.  Renal function noted to be elevated in setting of diuresis and as such IV Lasix held.   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  Moderate tricuspid valvular regurgitation, tricuspid aortic valve with moderate thickening of aortic valve no evidence of aortic  stenosis aortic valve sclerosis/calcification present. -Patient diuresed with IV Lasix with urine output of 1.050 L on 09/25/2023.  -Patient was transition to oral Lasix.   -Patient noted to have cough and congestion with some expiratory wheezing on 10/27/2022, workup done including chest x-ray and  BNP consistent with volume overload.   -Patient started back on Lasix 20 mg IV every 12 hours, urine output of 1.8 L over the past 24 hours.   -Patient currently - 8.669 L during this hospitalization.   -Hold diuretics today.  -Follow.  2.  Hypothermia, likely secondary to Klebsiella pneumoniae E. Coli -Patient noted to be hypothermic on admission, TSH within normal limits. -Chest x-ray concerning for cardiomegaly and edema. -Urinalysis consistent with UTI, urine cultures positive for Klebsiella pneumoniae. -Patient was on IV Merrem, and subsequently transitioned to Keflex to complete course of antibiotic treatment. -Supportive care.  3.  Leukocytosis -Likely secondary to Klebsiella pneumonia E. coli. -Patient with no respiratory symptoms. -Leukocytosis trending down. -Was on IV meropenem and has been transitioned to Keflex.  4.  Right knee pain -Patient with end-stage osteoarthritis of the right knee receiving steroid injections every 3 months in the outpatient setting per orthopedics. -Plain films of the right knee ordered that showed moderate patellofemoral compartment and mild medial and lateral compartment degenerative changes, chondrocalcinosis. -Orthopedics consulted who assessed patient on 08/27/2023, patient received a steroid injection of 1 cc Kenalog and 3 cc Marcaine. -PT/OT. -Outpatient follow-up with orthopedics.  5.  Primary pulmonary hypertension -2D echo done with severely elevated pulmonary artery systolic pressure, tricuspid regurgitant velocity 3.43 m/s and resume right atrial pressure 15 mmHg with estimated right ventricular systolic pressure 10.2 mmHg. -CT chest done with marked enlargement of main pulmonary artery and right pulmonary artery compatible with pulmonary artery hypertension.  No hilar mass noted.  Small bilateral pleural effusions with bibasilar compressive atelectasis.  Central bronchial wall thickening may be infectious/inflammatory.  Cardiomegaly.  1  cm incidental left thyroid nodule.  No follow-up imaging recommended. -IV Lasix initially held, patient placed on oral Lasix and placed back on IV Lasix.  -Hold diuretics today. -Outpatient follow-up.  6.  Hypertension -Was on IV Lasix which was held due to bump in creatinine. -Was on metoprolol which was held due to bradycardia and acute CHF exacerbation.  -Continue Norvasc 2.5 mg daily, continue low-dose Lopressor 12.5 mg twice daily.  -Patient placed back on IV Lasix which we will hold today..   -Follow.  7.  CKD stage IIIb -IV diuretics held due to bump in creatinine. -Renal function trending back up after patient placed on IV Lasix due to concerns for volume overload.   -Hold diuretics today.   -Repeat labs in the AM.   8.  Hyperlipidemia -Continue statin.  9.  GERD Continue Pepcid.    10.  Hemiplegia of nondominant side as late effect of CVA -PT/OT recommending SNF at this time.  11.  Iron deficiency anemia -Hemoglobin stable at 11.7. -Follow H&H. -Transfusion threshold hemoglobin < 7.  12.  Glaucoma,- -continue home regimen eyedrops.  13.  Hyperkalemia -Status post Lasix and Lokelma with improvement. -Patient on IV Lasix.   -Repeat potassium of 5.4.   -Lokelma twice daily x 2 doses.   -Repeat labs in the morning. -Follow.  14.  Hypoalbuminemia -Follow.  15.  Venous insufficiency and neuropathic ulcerations -Patient seen by Burton RN. -Continue Unna boot with changes every Monday and Thursday until outpatient follow-up with Dr. Amalia Hailey.  16.  Confusion/cough likely secondary to volume overload -  Per daughter patient with some confusion over the past 2 days, likely secondary to dehydration and increased BUN. -Patient more alert today and following commands.. -Patient on antibiotics being treated for UTI. -Chest x-ray done concerning for volume overload.   -Patient placed on Lasix 20 mg IV every 12 hours with urine output of 1.8 L over the past 24 hours. -Hold  diuretics today. -SLP evaluation for swallow and cognition, -Tessalon Perles as needed.   DVT prophylaxis: SCDs Code Status: DNR Family Communication: Updated patient and daughter at bedside.  Disposition: SNF when bed available and patient improved clinically..  Status is: Inpatient Remains inpatient appropriate because: Severity of illness   Consultants:  Orthopedics: Zadie Cleverly, PA 10/26/2022 Wound care, RN Ria Bush, RN 10/23/2022  Procedures:  CT chest 10/20/2022 Chest x-ray 10/20/2022, 10/21/2022, 10/23/2022, 10/24/2022 Plain films of the right knee 10/25/2022 2D echo 10/22/2022 Steroid injection right knee per orthopedics: Rene Paci, PA 10/26/2022  Antimicrobials:  Anti-infectives (From admission, onward)    Start     Dose/Rate Route Frequency Ordered Stop   10/25/22 1115  cephALEXin (KEFLEX) capsule 500 mg        500 mg Oral Every 12 hours 10/25/22 1018     10/21/22 2000  cefTRIAXone (ROCEPHIN) 1 g in sodium chloride 0.9 % 100 mL IVPB  Status:  Discontinued        1 g 200 mL/hr over 30 Minutes Intravenous Every 24 hours 10/21/22 1859 10/21/22 1906   10/21/22 1915  meropenem (MERREM) 1,000 mg in sodium chloride 0.9 % 100 mL IVPB  Status:  Discontinued       Note to Pharmacy: Adjust dose as needed.  History of ESBL UTIs presenting with hypothermia and pyuria on urine analysis.   1,000 mg 200 mL/hr over 30 Minutes Intravenous Every 12 hours 10/21/22 1906 10/25/22 1018         Subjective: More alert today.  Daughter at bedside.  Patient denies any chest pain, no shortness of breath, no abdominal pain.  Cough seems to have improved.   Objective: Vitals:   10/28/22 1254 10/28/22 2130 10/29/22 0522 10/29/22 1216  BP: 133/69 139/73 135/70 (!) 91/57  Pulse: 71 81 80 65  Resp:  16 17 18   Temp: 98 F (36.7 C) 98.6 F (37 C) 98.1 F (36.7 C) 98.3 F (36.8 C)  TempSrc: Oral Axillary Oral Oral  SpO2: 98% 97% 96% 92%  Weight:   57.2 kg   Height:         Intake/Output Summary (Last 24 hours) at 10/29/2022 1724 Last data filed at 10/29/2022 0535 Gross per 24 hour  Intake 0 ml  Output 700 ml  Net -700 ml    Filed Weights   10/26/22 0514 10/27/22 0539 10/29/22 0522  Weight: 62.9 kg 61.4 kg 57.2 kg    Examination:  General exam: More alert. Respiratory system: Less coarse breath sounds.  No wheezing.  No rhonchi.  Fair air movement.   Cardiovascular system: RRR with 3/6 SEM  LUSB.  No JVD, no murmurs rubs or gallops.  No lower extremity edema. Gastrointestinal system: Abdomen soft, nontender, nondistended, positive bowel sounds.  No rebound.  No guarding.   Central nervous system: More alert today.  Moving extremities spontaneously.  No focal neurological deficits.  Extremities: Symmetric 5 x 5 power.  Lower extremities in Unna boots. Skin: No rashes, lesions or ulcers Psychiatry: Judgement and insight unable to assess today.. Mood & affect appropriate.     Data Reviewed: I have  personally reviewed following labs and imaging studies  CBC: Recent Labs  Lab 10/24/22 0443 10/25/22 0415 10/26/22 0427 10/27/22 0953 10/28/22 0417 10/29/22 0510  WBC 12.3* 12.3* 12.8* 11.0* 12.5* 10.5  NEUTROABS 9.6* 9.5* 9.9* 9.2* 10.3*  --   HGB 9.2* 8.7* 8.9* 9.8* 10.6* 11.7*  HCT 30.2* 28.6* 29.7* 32.2* 34.7* 38.7  MCV 88.8 88.5 89.2 89.2 88.5 89.8  PLT 173 170 177 198 258 241     Basic Metabolic Panel: Recent Labs  Lab 10/23/22 0442 10/24/22 0443 10/25/22 0415 10/26/22 0427 10/27/22 0953 10/28/22 0417 10/29/22 0807  NA 140 137 138 139 140 139 139  K 5.1 5.1 4.7 5.1 4.9 5.1 5.4*  CL 103 102 105 105 110 103 103  CO2 26 26 26 26 23 25 25   GLUCOSE 95 104* 113* 112* 133* 107* 115*  BUN 60* 58* 59* 61* 59* 70* 78*  CREATININE 1.94* 1.74* 1.95* 1.67* 1.55* 1.52* 1.72*  CALCIUM 9.2 9.0 8.7* 8.9 9.0 9.2 9.1  MG 1.8 1.8 1.7 2.6* 2.5*  --   --   PHOS 4.8* 4.7* 4.4 3.9  --   --   --      GFR: Estimated Creatinine Clearance: 14.8  mL/min (A) (by C-G formula based on SCr of 1.72 mg/dL (H)).  Liver Function Tests: Recent Labs  Lab 10/23/22 0442 10/24/22 0443 10/25/22 0415 10/26/22 0427  AST 20 20 18 22   ALT 20 17 13 15   ALKPHOS 89 77 77 80  BILITOT 0.4 0.7 0.6 0.7  PROT 5.9* 5.5* 5.1* 5.4*  ALBUMIN 2.5* 2.5* 2.1* 2.3*     CBG: Recent Labs  Lab 10/29/22 0626 10/29/22 0701  GLUCAP 69* 110*      Recent Results (from the past 240 hour(s))  Resp panel by RT-PCR (RSV, Flu A&B, Covid) Anterior Nasal Swab     Status: None   Collection Time: 10/20/22  6:55 PM   Specimen: Anterior Nasal Swab  Result Value Ref Range Status   SARS Coronavirus 2 by RT PCR NEGATIVE NEGATIVE Final    Comment: (NOTE) SARS-CoV-2 target nucleic acids are NOT DETECTED.  The SARS-CoV-2 RNA is generally detectable in upper respiratory specimens during the acute phase of infection. The lowest concentration of SARS-CoV-2 viral copies this assay can detect is 138 copies/mL. A negative result does not preclude SARS-Cov-2 infection and should not be used as the sole basis for treatment or other patient management decisions. A negative result may occur with  improper specimen collection/handling, submission of specimen other than nasopharyngeal swab, presence of viral mutation(s) within the areas targeted by this assay, and inadequate number of viral copies(<138 copies/mL). A negative result must be combined with clinical observations, patient history, and epidemiological information. The expected result is Negative.  Fact Sheet for Patients:  EntrepreneurPulse.com.au  Fact Sheet for Healthcare Providers:  IncredibleEmployment.be  This test is no t yet approved or cleared by the [place] FDA and  has been authorized for detection and/or diagnosis of SARS-CoV-2 by FDA under an Emergency Use Authorization (EUA). This EUA will remain  in effect (meaning this test can be used) for the duration  of the COVID-19 declaration under Section 564(b)(1) of the Act, 21 U.S.C.section 360bbb-3(b)(1), unless the authorization is terminated  or revoked sooner.       Influenza A by PCR NEGATIVE NEGATIVE Final   Influenza B by PCR NEGATIVE NEGATIVE Final    Comment: (NOTE) The Xpert Xpress SARS-CoV-2/FLU/RSV plus assay is intended as an aid in  the diagnosis of influenza from Nasopharyngeal swab specimens and should not be used as a sole basis for treatment. Nasal washings and aspirates are unacceptable for Xpert Xpress SARS-CoV-2/FLU/RSV testing.  Fact Sheet for Patients: EntrepreneurPulse.com.au  Fact Sheet for Healthcare Providers: IncredibleEmployment.be  This test is not yet approved or cleared by the [place] FDA and has been authorized for detection and/or diagnosis of SARS-CoV-2 by FDA under an Emergency Use Authorization (EUA). This EUA will remain in effect (meaning this test can be used) for the duration of the COVID-19 declaration under Section 564(b)(1) of the Act, 21 U.S.C. section 360bbb-3(b)(1), unless the authorization is terminated or revoked.     Resp Syncytial Virus by PCR NEGATIVE NEGATIVE Final    Comment: (NOTE) Fact Sheet for Patients: EntrepreneurPulse.com.au  Fact Sheet for Healthcare Providers: IncredibleEmployment.be  This test is not yet approved or cleared by the [place] FDA and has been authorized for detection and/or diagnosis of SARS-CoV-2 by FDA under an Emergency Use Authorization (EUA). This EUA will remain in effect (meaning this test can be used) for the duration of the COVID-19 declaration under Section 564(b)(1) of the Act, 21 U.S.C. section 360bbb-3(b)(1), unless the authorization is terminated or revoked.  Performed at [facility], [address], [place], [place] [address]   MRSA Next Gen by PCR, Nasal     Status: None    Collection Time: 10/21/22  6:49 PM   Specimen: Nasal Mucosa; Nasal Swab  Result Value Ref Range Status   MRSA by PCR Next Gen NOT DETECTED NOT DETECTED Final    Comment: (NOTE) The GeneXpert MRSA Assay (FDA approved for NASAL specimens only), is one component of a comprehensive MRSA colonization surveillance program. It is not intended to diagnose MRSA infection nor to guide or monitor treatment for MRSA infections. Test performance is not FDA approved in patients less than 87 years old. Performed at [facility], [place] [address] [place], [place] [address]   Urine Culture     Status: Abnormal   Collection Time: 10/21/22  9:30 PM   Specimen: Urine, Catheterized  Result Value Ref Range Status   Specimen Description   Final    URINE, CATHETERIZED Performed at [facility] [address] [place], [place] [address]    Special Requests   Final    NONE Performed at [facility], [place] [address] [place], [place] [address]    Culture >=100,000 COLONIES/mL KLEBSIELLA PNEUMONIAE (A)  Final   Report Status 10/23/2022 FINAL  Final   Organism ID, Bacteria KLEBSIELLA PNEUMONIAE (A)  Final      Susceptibility   Klebsiella pneumoniae - MIC*    AMPICILLIN >=32 RESISTANT Resistant     CEFAZOLIN <=4 SENSITIVE Sensitive     CEFEPIME <=0.12 SENSITIVE Sensitive     CEFTRIAXONE <=0.25 SENSITIVE Sensitive     CIPROFLOXACIN <=0.25 SENSITIVE Sensitive     GENTAMICIN <=1 SENSITIVE Sensitive     IMIPENEM <=0.25 SENSITIVE Sensitive     NITROFURANTOIN 128 RESISTANT Resistant     TRIMETH/SULFA >=320 RESISTANT Resistant     AMPICILLIN/SULBACTAM 16 INTERMEDIATE Intermediate     PIP/TAZO <=4 SENSITIVE Sensitive     * >=100,000 COLONIES/mL KLEBSIELLA PNEUMONIAE         Radiology Studies: No results found.      Scheduled Meds:  amLODipine  2.5 mg Oral Daily   aspirin  81 mg Oral Daily   atorvastatin  80 mg Oral q1800   brimonidine  1  drop Both Eyes BID   cephALEXin  500 mg Oral Q12H   diclofenac Sodium  2 g Topical QID   famotidine  20 mg Oral Daily   Loteprednol Etabonate  1 drop Right Eye BID   metoprolol tartrate  12.5 mg Oral BID   mirabegron ER  25 mg Oral Daily   mouth rinse  15 mL Mouth Rinse 4 times per day   sodium zirconium cyclosilicate  10 g Oral BID   Continuous Infusions:   LOS: 7 days    Time spent: 35 minutes    Irine Seal, MD [facility]   To contact the attending provider between 7A-7P or the covering provider during after hours 7P-7A, please log into the web site [email or web address] and access using universal  password for that web site. If you do not have the password, please call the hospital operator.  10/29/2022, 5:24 PM

## 2022-10-30 DIAGNOSIS — I10 Essential (primary) hypertension: Secondary | ICD-10-CM | POA: Diagnosis not present

## 2022-10-30 DIAGNOSIS — N1832 Chronic kidney disease, stage 3b: Secondary | ICD-10-CM | POA: Diagnosis not present

## 2022-10-30 DIAGNOSIS — H409 Unspecified glaucoma: Secondary | ICD-10-CM | POA: Diagnosis not present

## 2022-10-30 DIAGNOSIS — I5033 Acute on chronic diastolic (congestive) heart failure: Secondary | ICD-10-CM | POA: Diagnosis not present

## 2022-10-30 LAB — CBC
HCT: 34.7 % — ABNORMAL LOW (ref 36.0–46.0)
Hemoglobin: 10.3 g/dL — ABNORMAL LOW (ref 12.0–15.0)
MCH: 26.7 pg (ref 26.0–34.0)
MCHC: 29.7 g/dL — ABNORMAL LOW (ref 30.0–36.0)
MCV: 89.9 fL (ref 80.0–100.0)
Platelets: 280 10*3/uL (ref 150–400)
RBC: 3.86 MIL/uL — ABNORMAL LOW (ref 3.87–5.11)
RDW: 16.4 % — ABNORMAL HIGH (ref 11.5–15.5)
WBC: 11.1 10*3/uL — ABNORMAL HIGH (ref 4.0–10.5)
nRBC: 0 % (ref 0.0–0.2)

## 2022-10-30 LAB — RENAL FUNCTION PANEL
Albumin: 2.5 g/dL — ABNORMAL LOW (ref 3.5–5.0)
Anion gap: 10 (ref 5–15)
BUN: 78 mg/dL — ABNORMAL HIGH (ref 8–23)
CO2: 27 mmol/L (ref 22–32)
Calcium: 8.7 mg/dL — ABNORMAL LOW (ref 8.9–10.3)
Chloride: 102 mmol/L (ref 98–111)
Creatinine, Ser: 1.84 mg/dL — ABNORMAL HIGH (ref 0.44–1.00)
GFR, Estimated: 25 mL/min — ABNORMAL LOW (ref >60)
Glucose, Bld: 100 mg/dL — ABNORMAL HIGH (ref 70–99)
Phosphorus: 4.9 mg/dL — ABNORMAL HIGH (ref 2.5–4.6)
Potassium: 4.8 mmol/L (ref 3.5–5.1)
Sodium: 139 mmol/L (ref 135–145)

## 2022-10-30 MED ORDER — CAMPHOR-MENTHOL 0.5-0.5 % EX LOTN
TOPICAL_LOTION | CUTANEOUS | Status: DC | PRN
  Filled 2022-10-30: qty 222

## 2022-10-30 NOTE — Progress Notes (Signed)
PROGRESS NOTE    [name]  VOJ:[id number] DOB: 1923-03-19 DOA: 10/20/2022 PCP: [name], DO    Chief Complaint  Patient presents with   Leg Swelling   Shortness of Breath    Brief Narrative:  HPI per Dr. Tennis Must on 10/21/21 [name] is a 89 y.o. 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   No chest pain, palpitations, diaphoresis, PND or orthopnea No fever, chills or night sweats. No sore throat, rhinorrhea, dyspnea, wheezing or hemoptysis  No appetite changes, abdominal pain, diarrhea, constipation, melena or hematochezia.  No flank pain, dysuria, frequency or hematuria.  No polyuria, polydipsia, polyphagia or blurred vision.     The patient was hypothermic on arrival with a temperature of 91.1 F.  She was transferred to the stepdown unit.  Urine analysis showed pyuria, but no bacteriuria.  Given hypothermia and history of ESBL UTI, urine culture and sensitivity order followed by meropenem 500 mg twice a day IVPB.   ED course: Initial vital signs were temperature 97.6 F, pulse 54, respirations 16, BP 131/95 mmHg O2 sat 100% on room air.  The patient is now on nasal cannula oxygen at 2 LPM most recent O2 sat is 100%.  She received furosemide 60 mg IVP and her home meds were ordered in the ED including metoprolol.   Lab work: Coronavirus, RSV and influenza PCR was negative.  CBC is her white count 9.2, hemoglobin 9.4 g/dL platelets 255.  Troponin x 2 normal.  BNP 580.4 pg/mL.  CMP showed a potassium of 5.4 mmol/L, glucose 111, BUN 63 and creatinine 1.51 mg/dL.  The rest of the CMP measurements were normal.  Baseline creatinine in  the last 2 years usually ranges between 1.2 and 1.6 mg/deciliter.   Imaging: Portable 1 view chest radiograph with a new right hilar/perihilar masslike density measuring 4.0 x 4.2 cm concerning for malignancy.  There is a new small right pleural effusion.  CT chest with contrast with marked enlargement of the main pulmonary artery and right pulmonary artery compatible with pulmonary hypertension.  No hilar mass identified.  Small bilateral pleural effusions with bibasilar compressive atelectasis.  Central peribronchial wall thickening that may be infectious or inflammatory.  Cardiomegaly.  1 cm incidental left thyroid nodule with no follow-up recommended.  Patient noted to be volume overloaded being diuresed with IV Lasix requiring oxygen needed to be weaned.  Renal function noted to be elevated in setting of diuresis and as such IV Lasix held.   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  Moderate tricuspid valvular regurgitation, tricuspid aortic valve with moderate thickening of aortic valve no evidence of aortic  stenosis aortic valve sclerosis/calcification present. -Patient diuresed with IV Lasix with urine output of 1.050 L on 09/25/2023.  -Patient was transitioned to oral Lasix.   -Patient noted to have cough and congestion with some expiratory wheezing on 10/27/2022, workup done including chest x-ray and  BNP consistent with volume overload.   -Patient started back on Lasix 20 mg IV every 12 hours, urine output of 1.9 L over the past 24 hours.   -Patient currently -10.644 L during this hospitalization.   -Diuretics were held on 10/29/2022, continue to hold diuretics through today and could likely resume on oral diuretics in the next 24 hours.  -Follow.  2.  Hypothermia, likely secondary to Klebsiella pneumoniae E. Coli -Patient noted to be hypothermic on admission, TSH within normal limits. -Chest x-ray concerning for cardiomegaly and edema. -Urinalysis consistent with UTI, urine cultures positive for Klebsiella pneumoniae. -Patient was on IV Merrem, and subsequently transitioned to Keflex to complete course of antibiotic treatment. -Supportive care.  3.  Leukocytosis -Likely secondary to Klebsiella pneumonia E. coli. -Patient with no respiratory symptoms. -Leukocytosis trending down. -Was on IV meropenem and has been transitioned to Keflex.  4.  Right knee pain -Patient with end-stage osteoarthritis of the right knee receiving steroid injections every 3 months in the outpatient setting per orthopedics. -Plain films of the right knee ordered that showed moderate patellofemoral compartment and mild medial and lateral compartment degenerative changes, chondrocalcinosis. -Orthopedics consulted who assessed patient on 08/27/2023, patient received a steroid injection of 1 cc Kenalog and 3 cc Marcaine. -PT/OT. -Outpatient follow-up with orthopedics.  5.  Primary pulmonary hypertension -2D echo done with severely elevated pulmonary artery systolic pressure, tricuspid regurgitant velocity 3.43 m/s and resume right atrial pressure 15 mmHg with estimated right ventricular systolic pressure 62.1 mmHg. -CT chest done with marked enlargement of main pulmonary artery and right pulmonary artery compatible with pulmonary artery hypertension.  No hilar mass noted.  Small bilateral pleural effusions with  bibasilar compressive atelectasis.  Central bronchial wall thickening may be infectious/inflammatory.  Cardiomegaly.  1 cm incidental left thyroid nodule.  No follow-up imaging recommended. -IV Lasix initially held, patient placed on oral Lasix and placed back on IV Lasix.  -Diuretics held 10/29/2022, continue to hold diuretics again today.   -Hopefully may resume diuretics orally tomorrow.  -Outpatient follow-up.  6.  Hypertension -Was on IV Lasix which was held due to bump in creatinine. -Was on metoprolol which was held due to bradycardia and acute CHF exacerbation.  -Continue Norvasc 2.5 mg daily, continue low-dose Lopressor 12.5 mg twice daily.  -Patient was on IV Lasix which is currently being on hold.  -Follow.  7.  CKD stage IIIb -IV diuretics held due to bump in creatinine. -Renal function trending back up after patient placed on IV Lasix due to concerns for volume overload.   -Diuretics held yesterday continue to hold diuretics again today.    -Repeat labs in the AM.   8.  Hyperlipidemia -Statin.  9.  GERD Pepcid.   10.  Hemiplegia of nondominant side as late effect of CVA -PT/OT recommending SNF at this time.  11.  Iron deficiency anemia -Hemoglobin stable at 10.3. -Follow H&H. -Transfusion threshold hemoglobin < 7.  12.  Glaucoma,- -continue home regimen eyedrops.  13.  Hyperkalemia -Status post Lasix and Lokelma with improvement. -Patient was on IV Lasix.   -Potassium of 4.8 today. -Lokelma twice daily x 2 doses given.   -Repeat labs in the morning. -Follow.  14.  Hypoalbuminemia -Follow.  15.  Venous insufficiency and neuropathic ulcerations -Patient seen by WOC RN. -Continue Unna boot with changes every Monday and Thursday until outpatient follow-up with Dr. Logan Bores.  16.  Confusion/cough likely secondary to volume overload -Per daughter patient with some confusion a few days ago felt likely secondary to dehydration in the setting of increased  BUN/creatinine.  -Patient alert, following commands appropriately. -Patient on antibiotics being treated for UTI. -Chest x-ray done concerning for volume overload.   -Patient placed on Lasix 20 mg IV every 12 hours with urine output of 1.9 L over the past 24 hours. -IV diuretics held over the past 24 hours. -Continue to hold diuretics. -SLP following.  -Tessalon Perles as needed.   DVT prophylaxis: SCDs Code Status: DNR Family Communication: Updated patient and daughter at bedside.  Disposition: SNF when bed available and patient improved clinically with improvement in renal function hopefully in the next 24 hours...  Status is: Inpatient Remains inpatient appropriate because: Severity of illness   Consultants:  Orthopedics: Zadie Cleverly, PA 10/26/2022 Wound care, RN Ria Bush, RN 10/23/2022  Procedures:  CT chest 10/20/2022 Chest x-ray 10/20/2022, 10/21/2022, 10/23/2022, 10/24/2022 Plain films of the right knee 10/25/2022 2D echo 10/22/2022 Steroid injection right knee per orthopedics: Rene Paci, PA 10/26/2022  Antimicrobials:  Anti-infectives (From admission, onward)    Start     Dose/Rate Route Frequency Ordered Stop   10/25/22 1115  cephALEXin (KEFLEX) capsule 500 mg        500 mg Oral Every 12 hours 10/25/22 1018     10/21/22 2000  cefTRIAXone (ROCEPHIN) 1 g in sodium chloride 0.9 % 100 mL IVPB  Status:  Discontinued        1 g 200 mL/hr over 30 Minutes Intravenous Every 24 hours 10/21/22 1859 10/21/22 1906   10/21/22 1915  meropenem (MERREM) 1,000 mg in sodium chloride 0.9 % 100 mL IVPB  Status:  Discontinued       Note to Pharmacy: Adjust dose as needed.  History of ESBL UTIs presenting with hypothermia and pyuria on urine analysis.   1,000 mg 200 mL/hr over 30 Minutes Intravenous Every 12 hours 10/21/22 1906 10/25/22 1018         Subjective: Sitting up in bed, alert.  Watching church on television.  No chest pain.  No shortness of breath.  No abdominal pain noted.  Daughter at bedside.  Objective: Vitals:   10/30/22 0336 10/30/22 0500 10/30/22 0538 10/30/22 0831  BP:   115/75 139/65  Pulse:   71 76  Resp:  18 16   Temp:   98.3 F (36.8 C) 98.3 F (36.8 C)  TempSrc:   Oral Oral  SpO2:   100% 100%  Weight: 58.5 kg     Height:        Intake/Output Summary (Last 24 hours) at 10/30/2022 1216 Last data filed at 10/30/2022 0500 Gross per 24 hour  Intake 300 ml  Output 1900 ml  Net -1600 ml    Filed Weights   10/27/22 0539 10/29/22 0522 10/30/22 0336  Weight: 61.4 kg 57.2 kg 58.5 kg    Examination:  General exam: Alert. Respiratory system: Clear to auscultation bilaterally anterior lung fields.  No wheezes, no crackles, no rhonchi.  Fair air movement.  Speaking in full sentences.   Cardiovascular system: RRR with 3/6 SEM LUSB.  No murmurs rubs or gallops.  No JVD.  No lower extremity edema.  Gastrointestinal system: Abdomen is soft, not distended, nontender, positive bowel sounds.  No rebound.  No guarding.  Central nervous system: Alert.  Following commands.  Moving extremities spontaneously.  No focal neurological deficits.   Extremities: Symmetric 5 x 5 power.  Lower extremities in Unna boots. Skin: No rashes, lesions or ulcers Psychiatry: Judgement and insight fair. Mood & affect appropriate.     Data Reviewed: I have personally reviewed following labs and imaging studies  CBC: Recent Labs  Lab 10/24/22 0443 10/25/22 0415 10/26/22 0427 10/27/22 0953 10/28/22 0417 10/29/22 0510 10/30/22 0527  WBC 12.3* 12.3* 12.8* 11.0* 12.5* 10.5 11.1*  NEUTROABS 9.6* 9.5* 9.9* 9.2* 10.3*  --   --   HGB 9.2* 8.7* 8.9* 9.8* 10.6* 11.7* 10.3*  HCT 30.2* 28.6* 29.7* 32.2* 34.7* 38.7 34.7*  MCV 88.8 88.5 89.2 89.2 88.5 89.8 89.9  PLT 173 170 177 198 258 241 280     Basic Metabolic Panel: Recent Labs  Lab 10/24/22 0443 10/25/22 0415 10/26/22 0427 10/27/22 0953 10/28/22 0417 10/29/22 0807 10/30/22 0527  NA 137 138 139 140 139 139  139  K 5.1 4.7 5.1 4.9 5.1 5.4* 4.8  CL 102 105 105 110 103 103 102  CO2 26 26 26 23 25 25 27   GLUCOSE 104* 113* 112* 133* 107* 115* 100*  BUN 58* 59* 61* 59* 70* 78* 78*  CREATININE 1.74* 1.95* 1.67* 1.55* 1.52* 1.72* 1.84*  CALCIUM 9.0 8.7* 8.9 9.0 9.2 9.1 8.7*  MG 1.8 1.7 2.6* 2.5*  --   --   --   PHOS 4.7* 4.4 3.9  --   --   --  4.9*     GFR: Estimated Creatinine Clearance: 14 mL/min (A) (by C-G formula based on SCr of 1.84 mg/dL (H)).  Liver Function Tests: Recent Labs  Lab 10/24/22 0443 10/25/22 0415 10/26/22 0427 10/30/22 0527  AST 20 18 22   --   ALT 17 13 15   --   ALKPHOS 77 77 80  --   BILITOT 0.7 0.6 0.7  --   PROT 5.5* 5.1* 5.4*  --   ALBUMIN 2.5* 2.1* 2.3* 2.5*     CBG: Recent Labs  Lab 10/29/22 0626 10/29/22 0701  GLUCAP 69* 110*      Recent Results (from the past 240 hour(s))  Resp panel by RT-PCR (RSV, Flu A&B, Covid) Anterior Nasal Swab     Status: None   Collection Time: 10/20/22  6:55 PM   Specimen: Anterior Nasal Swab  Result Value Ref Range Status   SARS Coronavirus 2 by RT PCR NEGATIVE NEGATIVE Final    Comment: (NOTE) SARS-CoV-2 target nucleic acids are NOT DETECTED.  The SARS-CoV-2 RNA is generally detectable in upper respiratory specimens during the acute phase of infection. The lowest concentration of SARS-CoV-2 viral copies this assay can detect is 138 copies/mL. A negative result does not preclude SARS-Cov-2 infection and should not be used as the sole basis for treatment or other patient management decisions. A negative result may occur with  improper specimen collection/handling, submission of specimen other than nasopharyngeal swab, presence of viral mutation(s) within the areas targeted by this assay, and inadequate number of viral copies(<138 copies/mL). A negative result must be combined with clinical observations, patient history, and epidemiological information. The expected result is Negative.  Fact Sheet for  Patients:  EntrepreneurPulse.com.au  Fact Sheet for Healthcare Providers:  IncredibleEmployment.be  This test is no t yet approved or cleared by the [place] FDA and  has been authorized for detection and/or diagnosis of SARS-CoV-2 by FDA under an Emergency Use Authorization (  EUA). This EUA will remain  in effect (meaning this test can be used) for the duration of the COVID-19 declaration under Section 564(b)(1) of the Act, 21 U.S.C.section 360bbb-3(b)(1), unless the authorization is terminated  or revoked sooner.       Influenza A by PCR NEGATIVE NEGATIVE Final   Influenza B by PCR NEGATIVE NEGATIVE Final    Comment: (NOTE) The Xpert Xpress SARS-CoV-2/FLU/RSV plus assay is intended as an aid in the diagnosis of influenza from Nasopharyngeal swab specimens and should not be used as a sole basis for treatment. Nasal washings and aspirates are unacceptable for Xpert Xpress SARS-CoV-2/FLU/RSV testing.  Fact Sheet for Patients: EntrepreneurPulse.com.au  Fact Sheet for Healthcare Providers: IncredibleEmployment.be  This test is not yet approved or cleared by the [place] FDA and has been authorized for detection and/or diagnosis of SARS-CoV-2 by FDA under an Emergency Use Authorization (EUA). This EUA will remain in effect (meaning this test can be used) for the duration of the COVID-19 declaration under Section 564(b)(1) of the Act, 21 U.S.C. section 360bbb-3(b)(1), unless the authorization is terminated or revoked.     Resp Syncytial Virus by PCR NEGATIVE NEGATIVE Final    Comment: (NOTE) Fact Sheet for Patients: EntrepreneurPulse.com.au  Fact Sheet for Healthcare Providers: IncredibleEmployment.be  This test is not yet approved or cleared by the [place] FDA and has been authorized for detection and/or diagnosis of SARS-CoV-2 by FDA under an Emergency  Use Authorization (EUA). This EUA will remain in effect (meaning this test can be used) for the duration of the COVID-19 declaration under Section 564(b)(1) of the Act, 21 U.S.C. section 360bbb-3(b)(1), unless the authorization is terminated or revoked.  Performed at [facility], [address] [place], [place] [address]   MRSA Next Gen by PCR, Nasal     Status: None   Collection Time: 10/21/22  6:49 PM   Specimen: Nasal Mucosa; Nasal Swab  Result Value Ref Range Status   MRSA by PCR Next Gen NOT DETECTED NOT DETECTED Final    Comment: (NOTE) The GeneXpert MRSA Assay (FDA approved for NASAL specimens only), is one component of a comprehensive MRSA colonization surveillance program. It is not intended to diagnose MRSA infection nor to guide or monitor treatment for MRSA infections. Test performance is not FDA approved in patients less than 60 years old. Performed at [facility], [place] [address] [place], [place] [address]   Urine Culture     Status: Abnormal   Collection Time: 10/21/22  9:30 PM   Specimen: Urine, Catheterized  Result Value Ref Range Status   Specimen Description   Final    URINE, CATHETERIZED Performed at [facility] [address], [place], [place] [address]    Special Requests   Final    NONE Performed at [facility], [place] [address], [place], [place] [address]    Culture >=100,000 COLONIES/mL KLEBSIELLA PNEUMONIAE (A)  Final   Report Status 10/23/2022 FINAL  Final   Organism ID, Bacteria KLEBSIELLA PNEUMONIAE (A)  Final      Susceptibility   Klebsiella pneumoniae - MIC*    AMPICILLIN >=32 RESISTANT Resistant     CEFAZOLIN <=4 SENSITIVE Sensitive     CEFEPIME <=0.12 SENSITIVE Sensitive     CEFTRIAXONE <=0.25 SENSITIVE Sensitive     CIPROFLOXACIN <=0.25 SENSITIVE Sensitive     GENTAMICIN <=1 SENSITIVE Sensitive     IMIPENEM <=0.25 SENSITIVE Sensitive     NITROFURANTOIN 128  RESISTANT Resistant  TRIMETH/SULFA >=320 RESISTANT Resistant     AMPICILLIN/SULBACTAM 16 INTERMEDIATE Intermediate     PIP/TAZO <=4 SENSITIVE Sensitive     * >=100,000 COLONIES/mL KLEBSIELLA PNEUMONIAE         Radiology Studies: No results found.      Scheduled Meds:  amLODipine  2.5 mg Oral Daily   aspirin  81 mg Oral Daily   atorvastatin  80 mg Oral q1800   brimonidine  1 drop Both Eyes BID   cephALEXin  500 mg Oral Q12H   diclofenac Sodium  2 g Topical QID   famotidine  20 mg Oral Daily   Loteprednol Etabonate  1 drop Right Eye BID   metoprolol tartrate  12.5 mg Oral BID   mirabegron ER  25 mg Oral Daily   mouth rinse  15 mL Mouth Rinse 4 times per day   Continuous Infusions:   LOS: 8 days    Time spent: 35 minutes    Ramiro Harvest, MD [facility]   To contact the attending provider between 7A-7P or the covering provider during after hours 7P-7A, please log into the web site [email or web address] and access using universal [place] password for that web site. If you do not have the password, please call the hospital operator.  10/30/2022, 12:16 PM

## 2022-10-30 NOTE — Plan of Care (Signed)
Discussed with patient plan of care for the evening, pain management and when we are getting back to bed from chair with some teach back displayed.  Problem: Education: Goal: Knowledge of General Education information will improve Description: Including pain rating scale, medication(s)/side effects and non-pharmacologic comfort measures Outcome: Progressing   Problem: Health Behavior/Discharge Planning: Goal: Ability to manage health-related needs will improve Outcome: Progressing

## 2022-10-31 DIAGNOSIS — I83019 Varicose veins of right lower extremity with ulcer of unspecified site: Secondary | ICD-10-CM | POA: Diagnosis not present

## 2022-10-31 DIAGNOSIS — R059 Cough, unspecified: Secondary | ICD-10-CM | POA: Diagnosis not present

## 2022-10-31 DIAGNOSIS — Z79899 Other long term (current) drug therapy: Secondary | ICD-10-CM | POA: Diagnosis not present

## 2022-10-31 DIAGNOSIS — Z8673 Personal history of transient ischemic attack (TIA), and cerebral infarction without residual deficits: Secondary | ICD-10-CM | POA: Diagnosis not present

## 2022-10-31 DIAGNOSIS — H409 Unspecified glaucoma: Secondary | ICD-10-CM | POA: Diagnosis not present

## 2022-10-31 DIAGNOSIS — R410 Disorientation, unspecified: Secondary | ICD-10-CM | POA: Diagnosis not present

## 2022-10-31 DIAGNOSIS — M109 Gout, unspecified: Secondary | ICD-10-CM | POA: Diagnosis not present

## 2022-10-31 DIAGNOSIS — R0989 Other specified symptoms and signs involving the circulatory and respiratory systems: Secondary | ICD-10-CM | POA: Diagnosis not present

## 2022-10-31 DIAGNOSIS — I69959 Hemiplegia and hemiparesis following unspecified cerebrovascular disease affecting unspecified side: Secondary | ICD-10-CM | POA: Diagnosis not present

## 2022-10-31 DIAGNOSIS — M75121 Complete rotator cuff tear or rupture of right shoulder, not specified as traumatic: Secondary | ICD-10-CM | POA: Diagnosis not present

## 2022-10-31 DIAGNOSIS — E875 Hyperkalemia: Secondary | ICD-10-CM

## 2022-10-31 DIAGNOSIS — R600 Localized edema: Secondary | ICD-10-CM | POA: Diagnosis not present

## 2022-10-31 DIAGNOSIS — Z743 Need for continuous supervision: Secondary | ICD-10-CM | POA: Diagnosis not present

## 2022-10-31 DIAGNOSIS — R350 Frequency of micturition: Secondary | ICD-10-CM | POA: Diagnosis not present

## 2022-10-31 DIAGNOSIS — H3530 Unspecified macular degeneration: Secondary | ICD-10-CM | POA: Diagnosis not present

## 2022-10-31 DIAGNOSIS — D509 Iron deficiency anemia, unspecified: Secondary | ICD-10-CM | POA: Diagnosis not present

## 2022-10-31 DIAGNOSIS — Z87440 Personal history of urinary (tract) infections: Secondary | ICD-10-CM | POA: Diagnosis not present

## 2022-10-31 DIAGNOSIS — E785 Hyperlipidemia, unspecified: Secondary | ICD-10-CM | POA: Diagnosis not present

## 2022-10-31 DIAGNOSIS — I270 Primary pulmonary hypertension: Secondary | ICD-10-CM | POA: Diagnosis not present

## 2022-10-31 DIAGNOSIS — L89622 Pressure ulcer of left heel, stage 2: Secondary | ICD-10-CM | POA: Diagnosis not present

## 2022-10-31 DIAGNOSIS — N3000 Acute cystitis without hematuria: Secondary | ICD-10-CM | POA: Diagnosis not present

## 2022-10-31 DIAGNOSIS — M25512 Pain in left shoulder: Secondary | ICD-10-CM | POA: Diagnosis not present

## 2022-10-31 DIAGNOSIS — I079 Rheumatic tricuspid valve disease, unspecified: Secondary | ICD-10-CM | POA: Diagnosis not present

## 2022-10-31 DIAGNOSIS — Z9181 History of falling: Secondary | ICD-10-CM | POA: Diagnosis not present

## 2022-10-31 DIAGNOSIS — I214 Non-ST elevation (NSTEMI) myocardial infarction: Secondary | ICD-10-CM | POA: Diagnosis not present

## 2022-10-31 DIAGNOSIS — I110 Hypertensive heart disease with heart failure: Secondary | ICD-10-CM | POA: Diagnosis not present

## 2022-10-31 DIAGNOSIS — N390 Urinary tract infection, site not specified: Secondary | ICD-10-CM | POA: Diagnosis not present

## 2022-10-31 DIAGNOSIS — I10 Essential (primary) hypertension: Secondary | ICD-10-CM | POA: Diagnosis not present

## 2022-10-31 DIAGNOSIS — M25511 Pain in right shoulder: Secondary | ICD-10-CM | POA: Diagnosis not present

## 2022-10-31 DIAGNOSIS — L89612 Pressure ulcer of right heel, stage 2: Secondary | ICD-10-CM | POA: Diagnosis not present

## 2022-10-31 DIAGNOSIS — L97512 Non-pressure chronic ulcer of other part of right foot with fat layer exposed: Secondary | ICD-10-CM | POA: Diagnosis not present

## 2022-10-31 DIAGNOSIS — M25522 Pain in left elbow: Secondary | ICD-10-CM | POA: Diagnosis not present

## 2022-10-31 DIAGNOSIS — H04123 Dry eye syndrome of bilateral lacrimal glands: Secondary | ICD-10-CM | POA: Diagnosis not present

## 2022-10-31 DIAGNOSIS — I2510 Atherosclerotic heart disease of native coronary artery without angina pectoris: Secondary | ICD-10-CM | POA: Diagnosis not present

## 2022-10-31 DIAGNOSIS — E7800 Pure hypercholesterolemia, unspecified: Secondary | ICD-10-CM | POA: Diagnosis not present

## 2022-10-31 DIAGNOSIS — K219 Gastro-esophageal reflux disease without esophagitis: Secondary | ICD-10-CM | POA: Diagnosis not present

## 2022-10-31 DIAGNOSIS — T68XXXD Hypothermia, subsequent encounter: Secondary | ICD-10-CM | POA: Diagnosis not present

## 2022-10-31 DIAGNOSIS — E11621 Type 2 diabetes mellitus with foot ulcer: Secondary | ICD-10-CM | POA: Diagnosis not present

## 2022-10-31 DIAGNOSIS — S91301A Unspecified open wound, right foot, initial encounter: Secondary | ICD-10-CM | POA: Diagnosis not present

## 2022-10-31 DIAGNOSIS — I5032 Chronic diastolic (congestive) heart failure: Secondary | ICD-10-CM | POA: Diagnosis not present

## 2022-10-31 DIAGNOSIS — N1832 Chronic kidney disease, stage 3b: Secondary | ICD-10-CM | POA: Diagnosis not present

## 2022-10-31 DIAGNOSIS — I272 Pulmonary hypertension, unspecified: Secondary | ICD-10-CM | POA: Diagnosis not present

## 2022-10-31 DIAGNOSIS — L97919 Non-pressure chronic ulcer of unspecified part of right lower leg with unspecified severity: Secondary | ICD-10-CM | POA: Diagnosis not present

## 2022-10-31 DIAGNOSIS — E43 Unspecified severe protein-calorie malnutrition: Secondary | ICD-10-CM | POA: Diagnosis not present

## 2022-10-31 DIAGNOSIS — M1711 Unilateral primary osteoarthritis, right knee: Secondary | ICD-10-CM | POA: Diagnosis not present

## 2022-10-31 DIAGNOSIS — M25569 Pain in unspecified knee: Secondary | ICD-10-CM | POA: Diagnosis not present

## 2022-10-31 DIAGNOSIS — Z7401 Bed confinement status: Secondary | ICD-10-CM | POA: Diagnosis not present

## 2022-10-31 DIAGNOSIS — N183 Chronic kidney disease, stage 3 unspecified: Secondary | ICD-10-CM | POA: Diagnosis not present

## 2022-10-31 DIAGNOSIS — E119 Type 2 diabetes mellitus without complications: Secondary | ICD-10-CM | POA: Diagnosis not present

## 2022-10-31 DIAGNOSIS — H35039 Hypertensive retinopathy, unspecified eye: Secondary | ICD-10-CM | POA: Diagnosis not present

## 2022-10-31 DIAGNOSIS — I5033 Acute on chronic diastolic (congestive) heart failure: Secondary | ICD-10-CM | POA: Diagnosis not present

## 2022-10-31 LAB — CBC
HCT: 31.8 % — ABNORMAL LOW (ref 36.0–46.0)
Hemoglobin: 9.7 g/dL — ABNORMAL LOW (ref 12.0–15.0)
MCH: 27.3 pg (ref 26.0–34.0)
MCHC: 30.5 g/dL (ref 30.0–36.0)
MCV: 89.6 fL (ref 80.0–100.0)
Platelets: 301 10*3/uL (ref 150–400)
RBC: 3.55 MIL/uL — ABNORMAL LOW (ref 3.87–5.11)
RDW: 16.2 % — ABNORMAL HIGH (ref 11.5–15.5)
WBC: 10.6 10*3/uL — ABNORMAL HIGH (ref 4.0–10.5)
nRBC: 0 % (ref 0.0–0.2)

## 2022-10-31 LAB — RENAL FUNCTION PANEL
Albumin: 2.6 g/dL — ABNORMAL LOW (ref 3.5–5.0)
Anion gap: 9 (ref 5–15)
BUN: 81 mg/dL — ABNORMAL HIGH (ref 8–23)
CO2: 24 mmol/L (ref 22–32)
Calcium: 8.7 mg/dL — ABNORMAL LOW (ref 8.9–10.3)
Chloride: 104 mmol/L (ref 98–111)
Creatinine, Ser: 1.72 mg/dL — ABNORMAL HIGH (ref 0.44–1.00)
GFR, Estimated: 27 mL/min — ABNORMAL LOW (ref >60)
Glucose, Bld: 115 mg/dL — ABNORMAL HIGH (ref 70–99)
Phosphorus: 4.4 mg/dL (ref 2.5–4.6)
Potassium: 4.6 mmol/L (ref 3.5–5.1)
Sodium: 137 mmol/L (ref 135–145)

## 2022-10-31 MED ORDER — AMLODIPINE BESYLATE 2.5 MG PO TABS: 2.5000 mg | ORAL_TABLET | Freq: Every day | ORAL | 1 refills | Status: DC

## 2022-10-31 MED ORDER — CAMPHOR-MENTHOL 0.5-0.5 % EX LOTN: TOPICAL_LOTION | CUTANEOUS | 0 refills | Status: DC | PRN

## 2022-10-31 MED ORDER — FLUTICASONE PROPIONATE 50 MCG/ACT NA SUSP: 1.0000 | Freq: Every day | NASAL | 2 refills | Status: DC

## 2022-10-31 MED ORDER — DICLOFENAC SODIUM 1 % EX GEL: 2.0000 g | Freq: Four times a day (QID) | CUTANEOUS | Status: DC

## 2022-10-31 MED ORDER — METOPROLOL TARTRATE 25 MG PO TABS: 12.5000 mg | ORAL_TABLET | Freq: Two times a day (BID) | ORAL | Status: DC

## 2022-10-31 MED ORDER — BENZONATATE 100 MG PO CAPS: 100.0000 mg | ORAL_CAPSULE | Freq: Three times a day (TID) | ORAL | 0 refills | Status: DC | PRN

## 2022-10-31 MED ORDER — FUROSEMIDE 20 MG PO TABS
20.0000 mg | ORAL_TABLET | Freq: Every day | ORAL | Status: DC
  Administered 2022-10-31: 20 mg via ORAL
  Filled 2022-10-31: qty 1

## 2022-10-31 NOTE — Care Management Important Message (Signed)
Important Message  Patient Details IM Letter given. Name: [name] MRN: [id number] Date of Birth: 22-Apr-1923   Medicare Important Message Given:  Yes     Kerin Salen 10/31/2022, 12:52 PM

## 2022-10-31 NOTE — Progress Notes (Signed)
Speech Language Pathology Treatment: Dysphagia  Patient Details Name: [name] MRN: [id number] DOB: [birth year] Today's Date: 10/31/2022 Time: 1215-1230 SLP Time Calculation (min) (ACUTE ONLY): 15 min  Assessment / Plan / Recommendation Clinical Impression  Patient seen by SLP for skilled treatment session focused on dysphagia goals. Son in law present in room. SLP observed patient eating her lunch meal and despite mildly prolonged mastication of soft solids, no overt s/s aspiration or penetration observed. Patient was able to self-feed and even open some containers and only required a mild amount of setup assistance from NT. Per son in law, patient seems to be close to baseline as far as cognition and he has not noticed any swallowing concerns. SLP advised him that if patient still exhibiting cognitive decline or if she has any future concerns of dysphagia, this can be addressed by SLP at SNF. No further acute care level SLP intervention recommended at this time.    HPI HPI: 89 yo female adm to [facility] with respiratory deficits - found to have CHF.  Pt has h/o CVA 2013 causing dysphagia and left sided weakness, CAD, NSTEMI, anemia, OA, glaucome, gout, HTN, macular degeneration and glaucoma.  Chest imaging showed bilateral pleural effusions with bibasilar compressive ATX, small hiatal hernia.  Swallow and cognitive evaluation.      SLP Plan  Discharge SLP treatment due to (comment);All goals met      Recommendations for follow up therapy are one component of a multi-disciplinary discharge planning process, led by the attending physician.  Recommendations may be updated based on patient status, additional functional criteria and insurance authorization.    Recommendations  Diet recommendations: Dysphagia 3 (mechanical soft);Thin liquid Liquids provided via: Cup;Straw Medication Administration: Whole meds with puree Supervision: Patient able to self feed Compensations: Slow rate;Small  sips/bites;Lingual sweep for clearance of pocketing Postural Changes and/or Swallow Maneuvers: Seated upright 90 degrees;Out of bed for meals                Oral Care Recommendations: Oral care BID Follow Up Recommendations: Skilled nursing-short term rehab (<3 hours/day) Assistance recommended at discharge: Intermittent Supervision/Assistance SLP Visit Diagnosis: Dysphagia, unspecified (R13.10) Plan: Discharge SLP treatment due to (comment);All goals met           [name], MA, CCC-SLP Speech Therapy

## 2022-10-31 NOTE — TOC Transition Note (Signed)
Transition of Care [facility]) - CM/SW Discharge Note   Patient Details  Name: SYESHA THAW MRN: [id number] Date of Birth: 1922-10-18  [facility] CM/SW Contact:  Illene Regulus, LCSW Phone Number: 10/31/2022, 3:16 PM   Clinical Narrative:     PT to d/c to [facility], CSW left daughter VM regarding pt's transition.pt's room assignment 207 call report to [phone]  PTAR called. No additional needs. TOC sign off.     Final next level of care: Skilled Nursing Facility Barriers to Discharge: No Barriers Identified   Patient Goals and CMS Choice CMS Medicare.gov Compare Post Acute Care list provided to:: Patient Represenative (must comment) Choice offered to / list presented to : Adult Children  Discharge Placement                Patient chooses bed at: [facility], [place] Patient to be transferred to facility by: [facility] Name of family member notified: McElveen,Beth (Daughter)    Discharge Plan and Services Additional resources added to the After Visit Summary for   In-house Referral: Clinical Social Work                                   Social Determinants of Health (SDOH) Interventions SDOH Screenings   Food Insecurity: No Food Insecurity (10/21/2022)  Housing: Low Risk  (10/21/2022)  Transportation Needs: No Transportation Needs (10/21/2022)  Utilities: Not At Risk (10/21/2022)  Tobacco Use: Low Risk  (10/20/2022)     Readmission Risk Interventions    01/17/2022    3:24 PM  Readmission Risk Prevention Plan  Transportation Screening Complete  PCP or Specialist Appt within 3-5 Days Complete  Palliative Care Screening Not Applicable

## 2022-10-31 NOTE — Progress Notes (Signed)
Physical Therapy Treatment Patient Details Name: [name] MRN: [id number] DOB: May 24, 1923 Today's Date: 10/31/2022   History of Present Illness Patient is a 89 yr old female admitted to the hospital with dyspnea and BLE edema. She was found to have acute on chronic diastolic CHF, hypothermia, and possible UTI. PMH. CAD, NSTEMI, anemia, OA, CVA, glaucoma, macular degeneration, gout, HTN    PT Comments    Pt agreeable to therapy, daughter at bedside. Pt needing min A to come to sitting EOB, painful BLE needing increased time and effort. Pt powers to stand with mod A on initial rep, heavy cues for hand placement and BLE engagement. Pt able to power to stand with min A for following reps with good hand placement. Pt able to take a step over to recliner at bedside using RW and min A to assist. Pt tolerates seated exercises without complaints. Pt remains up in recliner at [place] with all needs in reach.   Recommendations for follow up therapy are one component of a multi-disciplinary discharge planning process, led by the attending physician.  Recommendations may be updated based on patient status, additional functional criteria and insurance authorization.  Follow Up Recommendations  Skilled nursing-short term rehab (<3 hours/day) Can patient physically be transported by private vehicle: No   Assistance Recommended at Discharge Frequent or constant Supervision/Assistance  Patient can return home with the following A little help with walking and/or transfers;A little help with bathing/dressing/bathroom;Assistance with cooking/housework;Assist for transportation;Help with stairs or ramp for entrance   Equipment Recommendations  None recommended by PT    Recommendations for Other Services       Precautions / Restrictions Precautions Precautions: Fall Restrictions Weight Bearing Restrictions: No Other Position/Activity Restrictions: chronic bilateral feet wounds. wears specialized shoes.   Currenly wrapped     Mobility  Bed Mobility Overal bed mobility: Needs Assistance Bed Mobility: Supine to Sit  Supine to sit: Min assist  General bed mobility comments: min A to clear BLE and raise trunk to sitting EOB, increased time and effort to mobilize    Transfers Overall transfer level: Needs assistance Equipment used: Rolling walker (2 wheels) Transfers: Sit to/from Stand, Bed to chair/wheelchair/BSC Sit to Stand: Mod assist, Min assist Step pivot transfers: Min assist  General transfer comment: mod A to min A with power to stand, assist to shift weight anterior and cues to push from seated surface with hands and engage BLE; min A to steady with taking steps to recliner at bedside, cues for upright posture and BLE engagement to improve bil knee extension in stance.    Ambulation/Gait      Stairs             Wheelchair Mobility    Modified Rankin (Stroke Patients Only)       Balance Overall balance assessment: Needs assistance Sitting-balance support: Feet supported Sitting balance-Leahy Scale: Good  Standing balance support: Bilateral upper extremity supported, Reliant on assistive device for balance, During functional activity Standing balance-Leahy Scale: Poor     Cognition Arousal/Alertness: Awake/alert Behavior During Therapy: WFL for tasks assessed/performed Overall Cognitive Status: Within Functional Limits for tasks assessed  General Comments: pt hard of hearing, needing repeat cues at times        Exercises General Exercises - Lower Extremity Ankle Circles/Pumps: Seated, AROM, Both, 10 reps Long Arc Quad: Seated, AROM, Strengthening, Both, 10 reps Hip Flexion/Marching: Seated, AROM, Strengthening, Both, 10 reps Other Exercises Other Exercises: 4 STS reps from recliner, mod A for initial, progressing  to min A with final 3    General Comments        Pertinent Vitals/Pain Pain Assessment Pain Assessment: No/denies pain    Home Living                           Prior Function            PT Goals (current goals can now be found in the care plan section) Acute Rehab PT Goals Patient Stated Goal: home per family PT Goal Formulation: With family Time For Goal Achievement: 11/06/22 Potential to Achieve Goals: Good Progress towards PT goals: Progressing toward goals    Frequency    Min 3X/week      PT Plan Current plan remains appropriate    Co-evaluation              AM-PAC PT "6 Clicks" Mobility   Outcome Measure  Help needed turning from your back to your side while in a flat bed without using bedrails?: A Little Help needed moving from lying on your back to sitting on the side of a flat bed without using bedrails?: A Little Help needed moving to and from a bed to a chair (including a wheelchair)?: A Little Help needed standing up from a chair using your arms (e.g., wheelchair or bedside chair)?: A Little Help needed to walk in hospital room?: A Lot Help needed climbing 3-5 steps with a railing? : Total 6 Click Score: 15    End of Session Equipment Utilized During Treatment: Gait belt Activity Tolerance: Patient tolerated treatment well Patient left: in chair;with call bell/phone within reach;with chair alarm set;with family/visitor present Nurse Communication: Mobility status PT Visit Diagnosis: Muscle weakness (generalized) (M62.81);Difficulty in walking, not elsewhere classified (R26.2)     Time: 1478-2956 PT Time Calculation (min) (ACUTE ONLY): 24 min  Charges:  $Therapeutic Exercise: 8-22 mins $Therapeutic Activity: 8-22 mins                      Tori Quince Santana PT, DPT 10/31/22, 1:30 PM

## 2022-10-31 NOTE — TOC Progression Note (Addendum)
Transition of Care [facility]) - Progression Note    Patient Details  Name: [name] MRN: [id number] Date of Birth: Jun 03, 1923  [facility] CM/SW [place], LCSW Phone Number: 10/31/2022, 11:57 AM  Clinical Narrative:     Pt's insurance Josem Kaufmann is pending. TOC to follow.   Adden  1:15pm Pt's Josem Kaufmann was approved if medically stable will need d/c summary before 2pm. MD made aware.    Barriers to Discharge: Continued Medical Work up  Expected Discharge Plan and Services In-house Referral: Clinical Social Work     Living arrangements for the past 2 months: Single Family Home                                       Social Determinants of Health (SDOH) Interventions SDOH Screenings   Food Insecurity: No Food Insecurity (10/21/2022)  Housing: Low Risk  (10/21/2022)  Transportation Needs: No Transportation Needs (10/21/2022)  Utilities: Not At Risk (10/21/2022)  Tobacco Use: Low Risk  (10/20/2022)    Readmission Risk Interventions    01/17/2022    3:24 PM  Readmission Risk Prevention Plan  Transportation Screening Complete  PCP or Specialist Appt within 3-5 Days Complete  Palliative Care Screening Not Applicable

## 2022-10-31 NOTE — Discharge Summary (Signed)
Physician Discharge Summary  [name] TSV:[id number] DOB: 1923-04-28 DOA: 10/20/2022  PCP: [name], DO  Admit date: 10/20/2022 Discharge date: 10/31/2022  Time spent: 60 minutes  Recommendations for Outpatient Follow-up:  Patient was discharged to skilled nursing facility.  Follow-up with MD at SNF.  Patient will need a basic metabolic profile done in 1 week to follow-up on electrolytes and renal function.  Patient be discharged dysphagia 3 diet with thin liquids will need speech therapy follow-up at the outpatient setting. Follow-up with Dr. Daylene Katayama, podiatry in 1 week.   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  Diet recommendation: Dysphagia 3 diet with thin liquids/aspiration precautions  Filed Weights   10/29/22 0522 10/30/22 0336 10/31/22 0534  Weight: 57.2 kg 58.5 kg 58.3 kg    History of present illness:  HPI per Dr Etta Grandchild is a 89 y.o. 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   No chest pain, palpitations, diaphoresis, PND or orthopnea No fever, chills or night sweats. No sore throat, rhinorrhea, dyspnea, wheezing or hemoptysis  No appetite changes, abdominal pain, diarrhea,  constipation, melena or hematochezia.  No flank pain, dysuria, frequency or hematuria.  No polyuria, polydipsia, polyphagia or blurred vision.     The patient was hypothermic on arrival with a temperature of 91.1 F.  She was transferred to the stepdown unit.  Urine analysis showed pyuria, but no bacteriuria.  Given hypothermia and history of ESBL UTI, urine culture and sensitivity order followed by meropenem 500 mg twice a day IVPB.   ED course: Initial vital signs were temperature 97.6 F, pulse 54, respirations 16, BP 131/95 mmHg O2 sat 100% on room air.  The patient is now on nasal cannula oxygen at 2 LPM most recent O2 sat is 100%.  She received furosemide 60 mg IVP and her home meds were ordered in the ED including metoprolol.   Lab work: Coronavirus, RSV and influenza PCR was negative.  CBC is her white count 9.2, hemoglobin 9.4 g/dL platelets 255.  Troponin x 2 normal.  BNP 580.4 pg/mL.  CMP showed a potassium of 5.4 mmol/L, glucose 111, BUN 63 and creatinine 1.51 mg/dL.  The rest of the CMP measurements were normal.  Baseline creatinine in the last 2 years usually ranges between 1.2 and 1.6 mg/deciliter.   Imaging: Portable 1 view chest radiograph with a new right hilar/perihilar masslike density measuring 4.0 x 4.2 cm concerning for malignancy.  There is a new small right pleural effusion.  CT chest with contrast with marked enlargement of the main pulmonary artery and right pulmonary artery compatible with pulmonary hypertension.  No hilar mass identified.  Small bilateral pleural effusions with bibasilar compressive atelectasis.  Central peribronchial wall thickening that may be infectious or inflammatory.  Cardiomegaly.  1 cm incidental left thyroid  nodule with no follow-up recommended.  Hospital Course:  #1 acute respiratory failure with hypoxia likely secondary to acute on chronic diastolic CHF exacerbation -Patient admitted, 2D echo obtained with a EF of 60 to 65%,NWMA, moderate LVH,  grade 2 diastolic dysfunction.  Moderate tricuspid valvular regurgitation, tricuspid aortic valve with moderate thickening of aortic valve no evidence of aortic stenosis aortic valve sclerosis/calcification present. -Patient diuresed with IV Lasix with good urine output and patient was -10.494 L during this hospitalization.   -Patient initially transitioned to oral Lasix. -Patient noted to have cough and congestion with some expiratory wheezing on 10/27/2022, workup done including chest x-ray and BNP consistent with volume overload.   -Patient started back on Lasix 20 mg IV every 12 hours, with good urine output.    -Patient was -10.494 L during this hospitalization.   -Diuretics were held on 10/29/2022, and will be resumed on discharge.   -Outpatient follow-up.    2.  Hypothermia, likely secondary to Klebsiella pneumoniae E. Coli -Patient noted to be hypothermic on admission, TSH within normal limits. -Chest x-ray concerning for cardiomegaly and edema. -Urinalysis consistent with UTI, urine cultures positive for Klebsiella pneumoniae. -Patient was on IV Merrem, and subsequently transitioned to Keflex and completed a full course of antibiotic treatment.     3.  Leukocytosis -Likely secondary to Klebsiella pneumonia E. coli. -Patient with no respiratory symptoms. -Leukocytosis trended down. -Despotes IV Merrem, transition to oral Keflex and completed a full course of antibiotic treatment during the hospitalization.    4.  Right knee pain -Patient with end-stage osteoarthritis of the right knee receiving steroid injections every 3 months in the outpatient setting per orthopedics. -Plain films of the right knee ordered that showed moderate patellofemoral compartment and mild medial and lateral compartment degenerative changes, chondrocalcinosis. -Orthopedics consulted who assessed patient on 08/27/2023, patient received a steroid injection of 1 cc Kenalog and 3 cc Marcaine. -PT/OT. -Outpatient  follow-up with orthopedics.   5.  Primary pulmonary hypertension -2D echo done with severely elevated pulmonary artery systolic pressure, tricuspid regurgitant velocity 3.43 m/s and resume right atrial pressure 15 mmHg with estimated right ventricular systolic pressure 80.9 mmHg. -CT chest done with marked enlargement of main pulmonary artery and right pulmonary artery compatible with pulmonary artery hypertension.  No hilar mass noted.  Small bilateral pleural effusions with bibasilar compressive atelectasis.  Central bronchial wall thickening may be infectious/inflammatory.  Cardiomegaly.  1 cm incidental left thyroid nodule.  No follow-up imaging recommended. -IV Lasix initially held, patient placed on oral Lasix and placed back on IV Lasix.  -Diuretics held 10/29/2022, and will be resumed on day of discharge.   -Outpatient follow-up.    6.  Hypertension -Was on IV Lasix which was held due to bump in creatinine. -Was on metoprolol which was held due to bradycardia and acute CHF exacerbation.  -Patient maintained on home regimen Norvasc 2.5 mg daily, Lopressor start at a lower dose of 12.5 mg twice daily.  Patient also maintained on diuretics.   -Outpatient follow-up.    7.  CKD stage IIIb -IV diuretics held due to bump in creatinine. -Renal function trended back up after patient placed on IV Lasix due to concerns for volume overload.   -Diuretics held and will be resumed on discharge.   -Outpatient follow-up.     8.  Hyperlipidemia -Patient maintained on home regimen statin.   9.  GERD Patient maintained on home regimen Pepcid.    10.  Hemiplegia of nondominant side as late  effect of CVA -PT/OT recommending SNF at this time.   11.  Iron deficiency anemia -Hemoglobin remained stable and was 9.7 by day of discharge.    12.  Glaucoma,- -patient maintained on home regimen eyedrops.   13.  Hyperkalemia -Status post Lasix and Lokelma with improvement. -Patient was on IV Lasix.    -Patient placed on Lokelma twice daily with resolution of hyperkalemia potassium was 4.6 by day of discharge. -Outpatient follow-up.   14.  Hypoalbuminemia -Stable.   15.  Venous insufficiency and neuropathic ulcerations -Patient seen by [place] RN. -Patient placed on Unna boot with changes every Monday and Thursday until outpatient follow-up with Dr. Amalia Hailey.   16.  Confusion/cough likely secondary to volume overload -Per daughter patient with some confusion early on in the hospitalization, felt likely secondary to dehydration in the setting of increased BUN/creatinine.  -Patient alert, following commands appropriately. -Patient status post full course of antibiotics for UTI. -Chest x-ray done concerning for volume overload.   -Patient placed on Lasix 20 mg IV every 12 hours with good urine output, patient noted to be -10.4 L during this hospitalization.   -Diuretics held for 24 to 48 hours and patient will be resumed back on home regimen of Lasix 20 mg daily on discharge.   -Patient improved clinically and was close to baseline by day of discharge.     Procedures: CT chest 10/20/2022 Chest x-ray 10/20/2022, 10/21/2022, 10/23/2022, 10/24/2022 Plain films of the right knee 10/25/2022 2D echo 10/22/2022 Steroid injection right knee per orthopedics: Genia Harold, PA 1/17/202  Consultations: Orthopedics: Elizabeth Sauer, PA 10/26/2022 Wound care, RN Phineas Douglas, RN 10/23/2022  Discharge Exam: Vitals:   10/31/22 0534 10/31/22 1326  BP: 122/68 115/73  Pulse: 76 74  Resp: 18 12  Temp: 98.5 F (36.9 C) 98.2 F (36.8 C)  SpO2: 95% 98%    General: NAD Cardiovascular: RRR no m/r/g Respiratory: CTAB. No wheezes, no crackles, no rhonchi  Discharge Instructions   Discharge Instructions     Diet - low sodium heart healthy   Complete by: As directed    Dysphagia 3 diet with thin liquids Aspiration precautions.   Discharge wound care:   Complete by: As directed    Wound care to bilateral LE  wounds (Right plantar foot, left posterior heel and right lateral LE). Perform today, then on Thursday, 1/18, then twice weekly on Mondays and Thursdays:  Bedside RN to remove Unna's Boots. Cleanse LEs with house skin cleanser, pat dry. Cover wounds with size appropriate piece of silver hydrofiber (Aquacel Ag+ Advantage Lawson # [id number]). Cover with dry gauze and secure with silicone foam. Contact Ortho Tech to place Unna's boots per orders.   Increase activity slowly   Complete by: As directed       Allergies as of 10/31/2022       Reactions   Meloxicam Hives   Nitrofurantoin    Other Other (See Comments)   Guaifenesin Er Rash   Other reaction(s): rash   Nitrofuran Derivatives Rash        Medication List     STOP taking these medications    doxycycline 100 MG tablet Commonly known as: VIBRA-TABS       TAKE these medications    Acetaminophen 325 MG Caps Take 325 mg by mouth 3 (three) times daily as needed (pain).   amLODipine 2.5 MG tablet Commonly known as: NORVASC Take 1 tablet (2.5 mg total) by mouth daily.   aspirin 81 MG chewable tablet Chew 81  mg by mouth daily.   atorvastatin 80 MG tablet Commonly known as: LIPITOR Take 1 tablet (80 mg total) by mouth daily at 6 PM.   benzonatate 100 MG capsule Commonly known as: TESSALON Take 1 capsule (100 mg total) by mouth 3 (three) times daily as needed for cough.   Biofreeze 4 % Gel Generic drug: Menthol (Topical Analgesic) Apply 1 Application topically daily as needed (shoulder pain).   brimonidine 0.2 % ophthalmic solution Commonly known as: ALPHAGAN Place 1 drop into both eyes in the morning and at bedtime.   CALTRATE 600+D PO Take 1 tablet by mouth daily. gummy   camphor-menthol lotion Commonly known as: SARNA Apply topically as needed for itching.   chlorpheniramine 4 MG tablet Commonly known as: CHLOR-TRIMETON Take 4 mg by mouth daily.   Cranberry Extract 250 MG Tabs as directed Orally   [employer] Sod  Chloride Hypertonicity 5 % ophthalmic ointment Generic drug: sodium chloride PLACE 1 APPLICATION INTO THE RIGHT EYE AT BEDTIME.   sodium chloride 5 % ophthalmic solution Commonly known as: MURO 128 INSTILL 1 DROP INTO RIGHT EYE 4 TIMES A DAY   Cyanocobalamin 1000 MCG Tbcr 1 tablet Orally Once a day for 30 day(s)   diclofenac Sodium 1 % Gel Commonly known as: VOLTAREN Apply 2 g topically 4 (four) times daily. Apply to Knee   famotidine 20 MG tablet Commonly known as: PEPCID Take 20 mg by mouth daily.   fluticasone 50 MCG/ACT nasal spray Commonly known as: FLONASE Place 1 spray into both nostrils daily for 7 days.   Fluzone High-Dose Quadrivalent 0.7 ML Susy Generic drug: Influenza Vac High-Dose Quad   furosemide 40 MG tablet Commonly known as: LASIX Take 20 mg by mouth daily.   Gemtesa 75 MG Tabs Generic drug: Vibegron Take 75 mg by mouth daily.   gentamicin cream 0.1 % Commonly known as: GARAMYCIN Apply 1 application topically 2 (two) times daily.   Lotemax SM 0.38 % Gel Generic drug: Loteprednol Etabonate Place 1 drop into the right eye 2 (two) times daily.   metoprolol tartrate 25 MG tablet Commonly known as: LOPRESSOR Take 0.5 tablets (12.5 mg total) by mouth 2 (two) times daily. What changed:  medication strength how much to take   Pfizer-BioNT COVID-19 Vac-TriS Susp injection Generic drug: COVID-19 mRNA Vac-TriS (Pfizer)   Prolia 60 MG/ML Sosy injection Generic drug: denosumab 60 mg See admin instructions.   SYSTANE OP Apply 1 drop to eye 2 (two) times daily as needed (dry eyes).   timolol 0.5 % ophthalmic solution Commonly known as: TIMOPTIC Place 1 drop into both eyes every morning.               Discharge Care Instructions  (From admission, onward)           Start     Ordered   10/31/22 0000  Discharge wound care:       Comments: Wound care to bilateral LE wounds (Right plantar foot, left posterior heel and right lateral LE).  Perform today, then on Thursday, 1/18, then twice weekly on Mondays and Thursdays:  Bedside RN to remove Unna's Boots. Cleanse LEs with house skin cleanser, pat dry. Cover wounds with size appropriate piece of silver hydrofiber (Aquacel Ag+ Advantage Lawson # [id number]). Cover with dry gauze and secure with silicone foam. Contact Ortho Tech to place Unna's boots per orders.   10/31/22 1355           Allergies  Allergen Reactions   Meloxicam  Hives   Nitrofurantoin    Other Other (See Comments)   Guaifenesin Er Rash    Other reaction(s): rash   Nitrofuran Derivatives Rash    Contact information for follow-up providers     [facility] Follow up.   Why: Will follow up 24 to 48hrs after discharge. Contact information: [place] [address] [phone]         MD AT SNF Follow up.          Edrick Kins, DPM. Schedule an appointment as soon as possible for a visit in 1 week(s).   Specialty: Podiatry Contact information: [address] [place] [address] [phone]              Contact information for after-discharge care     Destination     [facility], [place] Preferred SNF .   Service: Skilled Nursing Contact information: [place] [phone]                      The results of significant diagnostics from this hospitalization (including imaging, microbiology, ancillary and laboratory) are listed below for reference.    Significant Diagnostic Studies: DG CHEST PORT 1 VIEW  Result Date: 10/27/2022 CLINICAL DATA:  10028 Wheezing 10028 EXAM: PORTABLE CHEST - 1 VIEW COMPARISON:  10/24/2022 FINDINGS: Cardiac silhouette is prominent. Hilar prominence consistent with pulmonary arterial hypertension. Right perihilar linear subsegmental atelectasis is new. Minimal bibasilar consolidation or volume loss. There is pulmonary interstitial prominence  with vascular congestion. No pneumothorax or pleural effusion identified. IMPRESSION: Findings suggest CHF. Bibasilar minimal consolidation or volume loss. Hilar prominence suggesting pulmonary arterial hypertension. Electronically Signed   By: Sammie Bench M.D.   On: 10/27/2022 12:30   DG Knee 1-2 Views Right  Result Date: 10/25/2022 CLINICAL DATA:  Right knee pain EXAM: RIGHT KNEE - 2 VIEW COMPARISON:  None Available. FINDINGS: No evidence of fracture, dislocation, or joint effusion. Moderate patellofemoral compartment and mild medial and lateral compartment degenerative changes. Diffuse demineralization. Chondrocalcinosis. Vascular calcifications. Soft tissues are unremarkable. IMPRESSION: 1. Moderate patellofemoral compartment and mild medial and lateral compartment degenerative changes. 2. Chondrocalcinosis. Electronically Signed   By: Yetta Glassman M.D.   On: 10/25/2022 12:07   DG CHEST PORT 1 VIEW  Result Date: 10/24/2022 CLINICAL DATA:  Shortness of breath. EXAM: PORTABLE CHEST 1 VIEW COMPARISON:  Chest x-ray 10/23/2022 and chest CT 10/20/2022. FINDINGS: The heart is normal in size. Stable prominent right hilum due to right pulmonary artery prominence. Underlying emphysematous changes and pulmonary scarring. Persistent small bilateral pleural effusions and bibasilar atelectasis. IMPRESSION: Persistent small bilateral pleural effusions and bibasilar atelectasis. Electronically Signed   By: Marijo Sanes M.D.   On: 10/24/2022 08:19   DG CHEST PORT 1 VIEW  Result Date: 10/23/2022 CLINICAL DATA:  Short of breath EXAM: PORTABLE CHEST 1 VIEW COMPARISON:  Prior chest x-ray 10/21/2022 FINDINGS: Unchanged appearance of the lungs with cardiomegaly, enlarged pulmonary arteries, bilateral layering effusions and bibasilar atelectasis. Chronic bronchitic changes and interstitial prominence are similar. No pneumothorax. No new airspace opacities. No acute osseous abnormality. IMPRESSION: No interval  change in the appearance of the chest compared to 10/21/2022. Persistent bilateral layering pleural effusions and associated bibasilar atelectasis versus infiltrate. Electronically Signed   By: Jacqulynn Cadet M.D.   On: 10/23/2022 07:45   ECHOCARDIOGRAM COMPLETE  Result Date: 10/22/2022    ECHOCARDIOGRAM REPORT   Patient Name:  [name] Date of Exam: 10/22/2022 Medical Rec #:  [id number]     Height:       60.0 in Accession #:    [id number]    Weight:       142.0 lb Date of Birth:  [birth year]      BSA:          1.614 m Patient Age:    9 years      BP:           168/59 mmHg Patient Gender: F             HR:           77 bpm. Exam Location:  Inpatient Procedure: 2D Echo, Cardiac Doppler and Color Doppler Indications:    R06.02 SOB; I50.40* Unspecified combined systolic (congestive)                 and diastolic (congestive) heart failure  History:        Patient has prior history of Echocardiogram examinations, most                 recent 08/19/2015. CHF and Cardiomegaly, Previous Myocardial                 Infarction, Stroke and TIA, Signs/Symptoms:Edema; Risk                 Factors:Hypertension and Dyslipidemia.  Sonographer:    Roseanna Rainbow RDCS Referring Phys: [id number] [place]  1. Small mid cavitary gradient . Left ventricular ejection fraction, by estimation, is 60 to 65%. The left ventricle has normal function. The left ventricle has no regional wall motion abnormalities. There is moderate left ventricular hypertrophy. Left ventricular diastolic parameters are consistent with Grade II diastolic dysfunction (pseudonormalization). Elevated left ventricular end-diastolic pressure.  2. Right ventricular systolic function is normal. The right ventricular size is normal. There is severely elevated pulmonary artery systolic pressure.  3. Mean gradient across valve in diastole 12 mmHg but MVA normal by PT1/2 secondary to MAC. The mitral valve is normal in structure. Mild mitral valve  regurgitation. No evidence of mitral stenosis. Moderate mitral annular calcification.  4. Tricuspid valve regurgitation is moderate.  5. The aortic valve is tricuspid. There is moderate calcification of the aortic valve. There is moderate thickening of the aortic valve. Aortic valve regurgitation is not visualized. Aortic valve sclerosis/calcification is present, without any evidence of aortic stenosis.  6. The inferior vena cava is normal in size with greater than 50% respiratory variability, suggesting right atrial pressure of 3 mmHg. FINDINGS  Left Ventricle: Small mid cavitary gradient. Left ventricular ejection fraction, by estimation, is 60 to 65%. The left ventricle has normal function. The left ventricle has no regional wall motion abnormalities. The left ventricular internal cavity size  was normal in size. There is moderate left ventricular hypertrophy. Left ventricular diastolic parameters are consistent with Grade II diastolic dysfunction (pseudonormalization). Elevated left ventricular end-diastolic pressure. Right Ventricle: The right ventricular size is normal. No increase in right ventricular wall thickness. Right ventricular systolic function is normal. There is severely elevated pulmonary artery systolic pressure. The tricuspid regurgitant velocity is 3.43 m/s, and with an assumed right atrial pressure of 15 mmHg, the estimated right ventricular systolic pressure is 19.4 mmHg. Left Atrium: Left atrial size was normal in size. Right Atrium: Right atrial size was normal in size. Pericardium: There is no evidence of pericardial effusion. Mitral Valve: Mean gradient across valve in diastole  12 mmHg but MVA normal by PT1/2 secondary to MAC. The mitral valve is normal in structure. There is moderate thickening of the mitral valve leaflet(s). Moderate mitral annular calcification. Mild mitral valve regurgitation. No evidence of mitral valve stenosis. MV peak gradient, 23.8 mmHg. The mean mitral valve  gradient is 12.0 mmHg. Tricuspid Valve: The tricuspid valve is normal in structure. Tricuspid valve regurgitation is moderate . No evidence of tricuspid stenosis. Aortic Valve: The aortic valve is tricuspid. There is moderate calcification of the aortic valve. There is moderate thickening of the aortic valve. Aortic valve regurgitation is not visualized. Aortic valve sclerosis/calcification is present, without any  evidence of aortic stenosis. Aortic valve mean gradient measures 11.0 mmHg. Aortic valve peak gradient measures 19.7 mmHg. Aortic valve area, by VTI measures 2.46 cm. Pulmonic Valve: The pulmonic valve was normal in structure. Pulmonic valve regurgitation is not visualized. No evidence of pulmonic stenosis. Aorta: The aortic root is normal in size and structure. Venous: The inferior vena cava is normal in size with greater than 50% respiratory variability, suggesting right atrial pressure of 3 mmHg. IAS/Shunts: No atrial level shunt detected by color flow Doppler.  LEFT VENTRICLE PLAX 2D LVIDd:         3.30 cm   Diastology LVIDs:         2.00 cm   LV e' medial:    4.57 cm/s LV PW:         1.40 cm   LV E/e' medial:  35.2 LV IVS:        1.50 cm   LV e' lateral:   4.68 cm/s LVOT diam:     2.10 cm   LV E/e' lateral: 34.4 LV SV:         116 LV SV Index:   72 LVOT Area:     3.46 cm  RIGHT VENTRICLE             IVC RV S prime:     10.20 cm/s  IVC diam: 2.05 cm TAPSE (M-mode): 2.0 cm LEFT ATRIUM             Index        RIGHT ATRIUM           Index LA diam:        2.90 cm 1.80 cm/m   RA Area:     13.10 cm LA Vol (A2C):   61.1 ml 37.86 ml/m  RA Volume:   29.50 ml  18.28 ml/m LA Vol (A4C):   35.0 ml 21.69 ml/m LA Biplane Vol: 49.9 ml 30.92 ml/m  AORTIC VALVE AV Area (Vmax):    2.34 cm AV Area (Vmean):   2.36 cm AV Area (VTI):     2.46 cm AV Vmax:           222.00 cm/s AV Vmean:          152.667 cm/s AV VTI:            0.470 m AV Peak Grad:      19.7 mmHg AV Mean Grad:      11.0 mmHg LVOT Vmax:          150.00 cm/s LVOT Vmean:        104.000 cm/s LVOT VTI:          0.334 m LVOT/AV VTI ratio: 0.71  AORTA Ao Root diam: 3.30 cm Ao Asc diam:  3.30 cm MITRAL VALVE  TRICUSPID VALVE MV Area (PHT): 3.42 cm       TR Peak grad:   47.1 mmHg MV Area VTI:   2.00 cm       TR Vmax:        343.00 cm/s MV Peak grad:  23.8 mmHg MV Mean grad:  12.0 mmHg      SHUNTS MV Vmax:       2.44 m/s       Systemic VTI:  0.33 m MV Vmean:      170.0 cm/s     Systemic Diam: 2.10 cm MV Decel Time: 222 msec MR Peak grad:    106.1 mmHg MR Mean grad:    63.0 mmHg MR Vmax:         515.00 cm/s MR Vmean:        381.0 cm/s MR PISA:         2.26 cm MR PISA Eff ROA: 17 mm MR PISA Radius:  0.60 cm MV E velocity: 161.00 cm/s MV A velocity: 180.00 cm/s MV E/A ratio:  0.89 Jenkins Rouge MD Electronically signed by Jenkins Rouge MD Signature Date/Time: 10/22/2022/9:10:47 AM    Final    DG CHEST PORT 1 VIEW  Result Date: 10/21/2022 CLINICAL DATA:  Acute CHF EXAM: PORTABLE CHEST 1 VIEW COMPARISON:  10/20/2022 chest x-ray and chest CT, radiograph 07/24/2017 FINDINGS: Cardiomegaly with central congestion and small pleural effusions, not significantly changed. Aortic atherosclerosis. No pneumothorax IMPRESSION: Cardiomegaly with central congestion and small pleural effusions. Electronically Signed   By: Donavan Foil M.D.   On: 10/21/2022 18:22   CT Chest W Contrast  Result Date: 10/20/2022 CLINICAL DATA:  Abnormal lung nodule. EXAM: CT CHEST WITH CONTRAST TECHNIQUE: Multidetector CT imaging of the chest was performed during intravenous contrast administration. RADIATION DOSE REDUCTION: This exam was performed according to the departmental dose-optimization program which includes automated exposure control, adjustment of the mA and/or kV according to patient size and/or use of iterative reconstruction technique. CONTRAST:  81m OMNIPAQUE IOHEXOL 300 MG/ML  SOLN COMPARISON:  Chest x-ray 10/20/2022 FINDINGS: Cardiovascular: Aorta is ectatic  without focal dilatation. The heart is enlarged. There is no pericardial effusion. There are atherosclerotic calcifications of the aorta and coronary arteries. The main pulmonary artery and right pulmonary artery are markedly enlarged likely accounting for masslike density seen on chest x-ray. Mediastinum/Nodes: There is nodularity of the thyroid gland with largest nodule measuring 1 cm in the left lobe. There are no enlarged mediastinal, hilar or axillary lymph nodes. Esophagus is nondilated. There is a small hiatal hernia. Lungs/Pleura: Small pleural effusions are present. There is compressive atelectasis in the bilateral lower lobes. There is some central peribronchial wall thickening. There is no pneumothorax. Upper Abdomen: Left renal cyst is partially imaged measuring up to 6.3 cm. Musculoskeletal: No acute fractures are identified. Right shoulder joint effusion is present. There is stable chronic compression deformity of L1. IMPRESSION: 1. Marked enlargement of the main pulmonary artery and right pulmonary artery compatible with pulmonary arterial hypertension. No hilar mass identified. 2. Small bilateral pleural effusions with bibasilar compressive atelectasis. 3. Central peribronchial wall thickening may be infectious/inflammatory. 4. Cardiomegaly. 5. 1 cm incidental left thyroid nodule. No follow-up imaging is recommended. Reference: J Am Coll Radiol. 2015 Feb;12(2): 143-50 Aortic Atherosclerosis (ICD10-I70.0). Electronically Signed   By: ARonney AstersM.D.   On: 10/20/2022 19:53   DG Chest Port 1 View  Result Date: 10/20/2022 CLINICAL DATA:  Shortness of breath EXAM: PORTABLE CHEST 1 VIEW COMPARISON:  Chest  x-ray 07/24/2017.  Chest CT 04/07/2015. FINDINGS: There is a new right hilar/perihilar masslike density measuring 4.0 x 4.2 cm. There is a new small right pleural effusion. The cardiac silhouette is within normal limits. No evidence for pneumothorax or acute fracture. IMPRESSION: 1. New right  hilar/perihilar masslike density measuring 4.0 x 4.2 cm. This is concerning for malignancy. Recommend further evaluation with chest CT with contrast. 2. New small right pleural effusion. Electronically Signed   By: Ronney Asters M.D.   On: 10/20/2022 18:25   OCT, Retina - OU - Both Eyes  Result Date: 10/07/2022 Right Eye Quality was poor. Progression has worsened. Findings include no SRF, abnormal foveal contour, subretinal hyper-reflective material, intraretinal fluid, inner retinal atrophy, outer retinal atrophy (Very poor image quality, retina attached with persistent retinal thickening / edema). Left Eye Quality was borderline. Central Foveal Thickness: 248. Progression has been stable. Findings include normal foveal contour, no IRF, no SRF, retinal drusen , outer retinal atrophy (stable improvement in focal IRF SN fovea, patchy ORA). Notes *Images captured and stored on drive Diagnosis / Impression: OD: Very poor image quality, retina attached with persistent retinal thickening / edema OS: NFP, no SRF, +drusen -- stable improvement of focal IRF SN fovea, patchy ORA Clinical management: See below Abbreviations: NFP - Normal foveal profile. CME - cystoid macular edema. PED - pigment epithelial detachment. IRF - intraretinal fluid. SRF - subretinal fluid. EZ - ellipsoid zone. ERM - epiretinal membrane. ORA - outer retinal atrophy. ORT - outer retinal tubulation. SRHM - subretinal hyper-reflective material    Microbiology: Recent Results (from the past 240 hour(s))  MRSA Next Gen by PCR, Nasal     Status: None   Collection Time: 10/21/22  6:49 PM   Specimen: Nasal Mucosa; Nasal Swab  Result Value Ref Range Status   MRSA by PCR Next Gen NOT DETECTED NOT DETECTED Final    Comment: (NOTE) The GeneXpert MRSA Assay (FDA approved for NASAL specimens only), is one component of a comprehensive MRSA colonization surveillance program. It is not intended to diagnose MRSA infection nor to guide or monitor  treatment for MRSA infections. Test performance is not FDA approved in patients less than 61 years old. Performed at [facility] [place] [address] [place], [place] [address]   Urine Culture     Status: Abnormal   Collection Time: 10/21/22  9:30 PM   Specimen: Urine, Catheterized  Result Value Ref Range Status   Specimen Description   Final    URINE, CATHETERIZED Performed at [place] [facility] [address], [place], [place] [address]    Special Requests   Final    NONE Performed at [facility], [place] [address] [place], [place] [address]    Culture >=100,000 COLONIES/mL KLEBSIELLA PNEUMONIAE (A)  Final   Report Status 10/23/2022 FINAL  Final   Organism ID, Bacteria KLEBSIELLA PNEUMONIAE (A)  Final      Susceptibility   Klebsiella pneumoniae - MIC*    AMPICILLIN >=32 RESISTANT Resistant     CEFAZOLIN <=4 SENSITIVE Sensitive     CEFEPIME <=0.12 SENSITIVE Sensitive     CEFTRIAXONE <=0.25 SENSITIVE Sensitive     CIPROFLOXACIN <=0.25 SENSITIVE Sensitive     GENTAMICIN <=1 SENSITIVE Sensitive     IMIPENEM <=0.25 SENSITIVE Sensitive     NITROFURANTOIN 128 RESISTANT Resistant     TRIMETH/SULFA >=320 RESISTANT Resistant     AMPICILLIN/SULBACTAM 16 INTERMEDIATE Intermediate     PIP/TAZO <=4 SENSITIVE Sensitive     * >=  100,000 COLONIES/mL KLEBSIELLA PNEUMONIAE     Labs: Basic Metabolic Panel: Recent Labs  Lab 10/25/22 0415 10/26/22 0427 10/27/22 0953 10/28/22 0417 10/29/22 0807 10/30/22 0527 10/31/22 0433  NA 138 139 140 139 139 139 137  K 4.7 5.1 4.9 5.1 5.4* 4.8 4.6  CL 105 105 110 103 103 102 104  CO2 _0 GLUCOSE 113* 112* 133* 107* 115* 100* 115*  BUN 59* 61* 59* 70* 78* 78* 81*  CREATININE 1.95* 1.67* 1.55* 1.52* 1.72* 1.84* 1.72*  CALCIUM 8.7* 8.9 9.0 9.2 9.1 8.7* 8.7*  MG 1.7 2.6* 2.5*  --   --   --   --   PHOS 4.4 3.9  --   --   --  4.9* 4.4   Liver Function Tests: Recent Labs  Lab  10/25/22 0415 10/26/22 0427 10/30/22 0527 10/31/22 0433  AST 18 22  --   --   ALT 13 15  --   --   ALKPHOS 77 80  --   --   BILITOT 0.6 0.7  --   --   PROT 5.1* 5.4*  --   --   ALBUMIN 2.1* 2.3* 2.5* 2.6*   No results for input(s): "LIPASE", "AMYLASE" in the last 168 hours. Recent Labs  Lab 10/28/22 1337  AMMONIA 14   CBC: Recent Labs  Lab 10/25/22 0415 10/26/22 0427 10/27/22 0953 10/28/22 0417 10/29/22 0510 10/30/22 0527 10/31/22 0433  WBC 12.3* 12.8* 11.0* 12.5* 10.5 11.1* 10.6*  NEUTROABS 9.5* 9.9* 9.2* 10.3*  --   --   --   HGB 8.7* 8.9* 9.8* 10.6* 11.7* 10.3* 9.7*  HCT 28.6* 29.7* 32.2* 34.7* 38.7 34.7* 31.8*  MCV 88.5 89.2 89.2 88.5 89.8 89.9 89.6  PLT 170 177 198 258 241 280 301   Cardiac Enzymes: No results for input(s): "CKTOTAL", "CKMB", "CKMBINDEX", "TROPONINI" in the last 168 hours. BNP: BNP (last 3 results) Recent Labs    10/20/22 1658 10/27/22 0953  BNP 580.4* 699.9*    ProBNP (last 3 results) No results for input(s): "PROBNP" in the last 8760 hours.  CBG: Recent Labs  Lab 10/29/22 0626 10/29/22 0701  GLUCAP 69* 110*       Signed:  Irine Seal MD.  [facility] 10/31/2022, 2:11 PM

## 2022-11-01 DIAGNOSIS — E119 Type 2 diabetes mellitus without complications: Secondary | ICD-10-CM | POA: Diagnosis not present

## 2022-11-02 DIAGNOSIS — L89622 Pressure ulcer of left heel, stage 2: Secondary | ICD-10-CM | POA: Diagnosis not present

## 2022-11-02 DIAGNOSIS — L89612 Pressure ulcer of right heel, stage 2: Secondary | ICD-10-CM | POA: Diagnosis not present

## 2022-11-04 DIAGNOSIS — I2510 Atherosclerotic heart disease of native coronary artery without angina pectoris: Secondary | ICD-10-CM | POA: Diagnosis not present

## 2022-11-04 DIAGNOSIS — I5032 Chronic diastolic (congestive) heart failure: Secondary | ICD-10-CM | POA: Diagnosis not present

## 2022-11-04 DIAGNOSIS — Z87440 Personal history of urinary (tract) infections: Secondary | ICD-10-CM | POA: Diagnosis not present

## 2022-11-04 DIAGNOSIS — E785 Hyperlipidemia, unspecified: Secondary | ICD-10-CM | POA: Diagnosis not present

## 2022-11-04 DIAGNOSIS — L97919 Non-pressure chronic ulcer of unspecified part of right lower leg with unspecified severity: Secondary | ICD-10-CM | POA: Diagnosis not present

## 2022-11-04 DIAGNOSIS — I83019 Varicose veins of right lower extremity with ulcer of unspecified site: Secondary | ICD-10-CM | POA: Diagnosis not present

## 2022-11-05 DIAGNOSIS — E119 Type 2 diabetes mellitus without complications: Secondary | ICD-10-CM | POA: Diagnosis not present

## 2022-11-05 DIAGNOSIS — N390 Urinary tract infection, site not specified: Secondary | ICD-10-CM | POA: Diagnosis not present

## 2022-11-07 DIAGNOSIS — I10 Essential (primary) hypertension: Secondary | ICD-10-CM | POA: Diagnosis not present

## 2022-11-08 DIAGNOSIS — I110 Hypertensive heart disease with heart failure: Secondary | ICD-10-CM | POA: Diagnosis not present

## 2022-11-08 DIAGNOSIS — Z79899 Other long term (current) drug therapy: Secondary | ICD-10-CM | POA: Diagnosis not present

## 2022-11-09 ENCOUNTER — Other Ambulatory Visit: Payer: Self-pay | Admitting: *Deleted

## 2022-11-09 DIAGNOSIS — L89622 Pressure ulcer of left heel, stage 2: Secondary | ICD-10-CM | POA: Diagnosis not present

## 2022-11-09 DIAGNOSIS — L89612 Pressure ulcer of right heel, stage 2: Secondary | ICD-10-CM | POA: Diagnosis not present

## 2022-11-09 NOTE — Patient Outreach (Signed)
Ms. Forry resides in [facility] skilled nursing facility. Screening for potential [facility] care coordination services as benefit of health plan and Primary Care Provider.   Collaboration with Bryson Ha, Conservation officer, nature. Ms. Trafton is doing well with therapy. Will know more about transition plans after family meeting.   Will continue to follow for potential [facility] care coordination needs.   Marthenia Rolling, MSN, RN,BSN [place] Acute Care Coordinator [phone] (Direct dial)

## 2022-11-10 ENCOUNTER — Encounter: Payer: Self-pay | Admitting: Podiatry

## 2022-11-14 ENCOUNTER — Telehealth: Payer: Self-pay

## 2022-11-14 ENCOUNTER — Ambulatory Visit: Payer: Medicare Other | Admitting: Podiatry

## 2022-11-14 DIAGNOSIS — I110 Hypertensive heart disease with heart failure: Secondary | ICD-10-CM | POA: Diagnosis not present

## 2022-11-14 DIAGNOSIS — E11621 Type 2 diabetes mellitus with foot ulcer: Secondary | ICD-10-CM | POA: Diagnosis not present

## 2022-11-14 DIAGNOSIS — R600 Localized edema: Secondary | ICD-10-CM | POA: Diagnosis not present

## 2022-11-14 DIAGNOSIS — L97512 Non-pressure chronic ulcer of other part of right foot with fat layer exposed: Secondary | ICD-10-CM

## 2022-11-14 NOTE — Telephone Encounter (Signed)
[facility] order sent Calcium Alginate dressing 3X1wk 90 days

## 2022-11-14 NOTE — Progress Notes (Signed)
Chief Complaint  Patient presents with   Foot Ulcer    Right foot ulcer check, heel pain, left leg wound from cutting the unna boot off and skin being so soft,     Subjective:  89 y.o. female with PMHx of diabetes mellitus presenting today with her granddaughter for follow-up routine foot care and for evaluation of chronic ulcers to the plantar aspect of the right forefoot.  Overall the patient is doing well.  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 ([place])    Fall at home 09/26/2012   Glaucoma    OU   Gout, joint    Hypertension    Hypertensive retinopathy    OU   Macular degeneration    OU   NSTEMI (non-ST elevated myocardial infarction) ([place]) 08/2015   Stroke [facility])    Past Surgical History:  Procedure Laterality Date   ABDOMINAL HYSTERECTOMY     BREAST SURGERY     CYST   CARDIAC CATHETERIZATION N/A 08/18/2015   Procedure: Left Heart Cath and Coronary Angiography;  Surgeon: Troy Sine, MD;  Location: [place] [facility];  Service: Cardiovascular;  Laterality: N/A;   CARDIAC CATHETERIZATION  08/18/2015   Procedure: Coronary Stent Intervention;  Surgeon: Troy Sine, MD;  Location: [place];  Service: Cardiovascular;;   CATARACT EXTRACTION Bilateral    DILATION AND CURETTAGE OF UTERUS     EYE SURGERY Bilateral    HERNIA REPAIR     IRIDOTOMY / IRIDECTOMY Right    JOINT REPLACEMENT  left knee   REFRACTIVE SURGERY Right 2017   Allergies  Allergen Reactions   Meloxicam Hives   Nitrofurantoin    Other Other (See Comments)   Guaifenesin Er Rash    Other reaction(s): rash   Nitrofuran Derivatives Rash    LT heel 11/14/2022   RT heel 11/14/2022   RT foot 11/14/2022  Objective/Physical Exam General: The patient is alert and oriented x3 in no acute distress.  Dermatology:  Ulcers noted to the first MTP as well as the  posterior heels bilateral.  They appear stable.  Extensive fibrotic debris within the first MTP ulcer with serous drainage.  The posterior heels appear very superficial and stable with good potential for healing.  No malodor.  No appreciable purulence.  Vascular: Palpable pedal pulses bilaterally.  Chronic bilateral lower extremity edema noted.  No erythema noted today  Neurological: Light touch and protective threshold diminished bilaterally.   Musculoskeletal Exam: Fat pad atrophy with tenderness to palpation right forefoot.  Palpation of the metatarsal heads noted specifically to the subsecond and third MTPJ  Radiographic exam B/L feet 08/15/2022: Diffuse degenerative changes noted throughout the foot and osteopenia noted which is expected given the patient's age.  No osseous erosions or concern for underlying bone infection/osteomyelitis.  Posterior heel spurs noted bilateral.  Assessment: 1.  Preulcerative callus right plantar forefoot secondary to diabetes mellitus 2.  Ulcer posterior heel bilateral 3.  Chronic lower extremity edema  4.  Diabetes mellitus with peripheral polyneuropathy   Plan of Care:  1. Patient was evaluated.  2.  Unna boot compression wraps were applied to the bilateral lower extremities today.  3.  Medically necessary excisional debridement including subcutaneous tissue was performed using a tissue nipper.  Excisional debridement of all necrotic nonviable tissue down to healthier bleeding viable tissue  was performed with postdebridement measurement same as pre- 4.  Continue WBAT postsurgical shoes 5.  Continue home Unna boot wraps at home.  Order placed to [facility] for Aquacel Ag to be applied to the posterior heels as well as the first MTP of the right foot with the Unna boot wraps 6.  Patient's daughter is going to purchase offloading heel cushions.  Recommend wearing daily when she is resting in the recliner and also nightly when she is sleeping  7.   Return to clinic 4 weeks  Edrick Kins, DPM [facility]  Dr. Edrick Kins, DPM    [address], [place] [address]                Office [phone]  Fax [phone]

## 2022-11-15 ENCOUNTER — Other Ambulatory Visit: Payer: Self-pay | Admitting: *Deleted

## 2022-11-15 NOTE — Patient Outreach (Signed)
[place] Coordinator follow up. Ms. Behne resides in [facility] skilled nursing facility. Screening for potential [facility] care coordination services as benefit of health plan and Primary Care Provider.   Collaboration with Bryson Ha, Conservation officer, nature. Bryson Ha reports transition plan is to return home with family alternating shifts as before. States therapy has recommended caregiver assist as well. Unsure of transition date. Currently in appeal process.   Will continue to follow.   Marthenia Rolling, MSN, RN,BSN [place] Acute Care Coordinator [phone] (Direct dial)

## 2022-11-17 DIAGNOSIS — R350 Frequency of micturition: Secondary | ICD-10-CM | POA: Diagnosis not present

## 2022-11-18 DIAGNOSIS — E11621 Type 2 diabetes mellitus with foot ulcer: Secondary | ICD-10-CM | POA: Diagnosis not present

## 2022-11-21 DIAGNOSIS — I10 Essential (primary) hypertension: Secondary | ICD-10-CM | POA: Diagnosis not present

## 2022-11-21 DIAGNOSIS — I110 Hypertensive heart disease with heart failure: Secondary | ICD-10-CM | POA: Diagnosis not present

## 2022-11-21 DIAGNOSIS — E119 Type 2 diabetes mellitus without complications: Secondary | ICD-10-CM | POA: Diagnosis not present

## 2022-11-21 DIAGNOSIS — N390 Urinary tract infection, site not specified: Secondary | ICD-10-CM | POA: Diagnosis not present

## 2022-11-23 ENCOUNTER — Other Ambulatory Visit: Payer: Self-pay | Admitting: *Deleted

## 2022-11-23 DIAGNOSIS — L89622 Pressure ulcer of left heel, stage 2: Secondary | ICD-10-CM | POA: Diagnosis not present

## 2022-11-23 DIAGNOSIS — S91301A Unspecified open wound, right foot, initial encounter: Secondary | ICD-10-CM | POA: Diagnosis not present

## 2022-11-23 DIAGNOSIS — L89612 Pressure ulcer of right heel, stage 2: Secondary | ICD-10-CM | POA: Diagnosis not present

## 2022-11-23 NOTE — Patient Outreach (Signed)
[place] Coordinator follow up. Ms. Everman resides in [facility]. Screening for potential [facility] care coordination services as benefit of health plan and Primary Care Provider.   Update received from [place], [place] social worker. Discharge is in appeal process again. Discharge date not known yet. However, transition plan remains to return home with family taking turns staying with Ms. Wardell.   Will continue to follow.   Marthenia Rolling, MSN, RN,BSN [place] Acute Care Coordinator [phone] (Direct dial)

## 2022-11-25 NOTE — Progress Notes (Unsigned)
Cardiology Office Note:    Date:  11/28/2022   ID:  [name], DOB 1923/10/01, MRN [id number]  PCP:  [name], [facility] Cardiologist:  Paula Ruths, MD {  Referring MD: Paula Morning, DO   Chief Complaint  Patient presents with   Hospitalization Follow-up    Diastolic CHF     History of Present Illness:    [name] is a 89 y.o. female with a hx of CAD, HTN, HLD, history of CVA, anemia. Patient is followed by Dr. Stanford Breed and presents today for follow up after a recent hospitalization.   Per chart review, patient had bilateral carotid ultrasounds in 07/2015 that were unremarkable. Later had an NSTEMI in 08/2015 and underwent successful PCI to the distal RCA with PTCA and insertion of a DES. Echocardiogram on 08/19/15 showed EF 65-70%, no regional wall motion abnormalities, mild LVH, moderate tricuspid regurgitation. She had normal ABIs in 06/2019.   Patient was admitted to [facility] hospital from 10/20/22-10/31/22 for treatment of acute on chronic diastolic heart failure. Patient had presented to the ED complaining of dyspnea and bilateral lower extremity edema. She was hypothermic on arrival and was found to have a UTI. BNP was elevated to 580.4. CT chest showed marked enlargement of the main pulmonary artery and right pulmonary artery compatible with pulmonary arterial hypertension, small bilateral pleural effusions with bibasilar compressive atelectasis. Echocardiogram on 10/22/22 showed EF 60-65%, no regional wall motion abnormalities, moderate LVH, grade II diastolic dysfunction, normal RV systolic function, severely elevated pulmonary artery systolic pressure, mild mitral valve regurgitation, moderate tricuspid valve regurgitation. Patient was treated with IV lasix and was net -10.494 L at discharge. Patient was discharged to a skilled nursing facility.   Today, patient presents accompanied by her daughter. Patient is hard of hearing, and daughter helps  to supplement the history. Patient denies chest pain, shortness of breath, orthopnea, palpitations, dizziness, syncope, near syncope. She does have mild ankle edema, but it improves with una boots and lasix. Patient's daughter reports that the patient has been living in a skilled nursing rehab facility for the past month. She has been getting stronger, so the family plans to take her home this week. When she is home, her daughters take turns staying with her. Daughters make sure to feed the patient a low sodium diet and weigh her every [name] Her weight is usually 132-134 lbs. She is almost always in her wheelchair. No falls or bleeding issues on ASA.    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 ([place])    Fall at home 09/26/2012   Glaucoma    OU   Gout, joint    Hypertension    Hypertensive retinopathy    OU   Macular degeneration    OU   NSTEMI (non-ST elevated myocardial infarction) ([place]) 08/2015   Stroke [facility])     Past Surgical History:  Procedure Laterality Date   ABDOMINAL HYSTERECTOMY     BREAST SURGERY     CYST   CARDIAC CATHETERIZATION N/A 08/18/2015   Procedure: Left Heart Cath and Coronary Angiography;  Surgeon: Troy Sine, MD;  Location: [facility];  Service: Cardiovascular;  Laterality: N/A;   CARDIAC CATHETERIZATION  08/18/2015   Procedure: Coronary Stent Intervention;  Surgeon: Troy Sine, MD;  Location: [facility];  Service: Cardiovascular;;   CATARACT EXTRACTION  Bilateral    DILATION AND CURETTAGE OF UTERUS     EYE SURGERY Bilateral    HERNIA REPAIR     IRIDOTOMY / IRIDECTOMY Right    JOINT REPLACEMENT  left knee   REFRACTIVE SURGERY Right 2017    Current Medications: Current Meds  Medication Sig   Acetaminophen 325 MG CAPS Take 325 mg by mouth 3 (three) times daily as needed (pain).   amLODipine (NORVASC) 2.5 MG tablet Take 1 tablet  (2.5 mg total) by mouth daily.   aspirin 81 MG chewable tablet Chew 81 mg by mouth daily.   atorvastatin (LIPITOR) 80 MG tablet Take 1 tablet (80 mg total) by mouth daily at 6 PM.   benzonatate (TESSALON) 100 MG capsule Take 1 capsule (100 mg total) by mouth 3 (three) times daily as needed for cough.   brimonidine (ALPHAGAN) 0.2 % ophthalmic solution Place 1 drop into both eyes in the [name] and at bedtime.   Calcium Carbonate-Vitamin D (CALTRATE 600+D PO) Take 1 tablet by mouth daily. gummy   camphor-menthol (SARNA) lotion Apply topically as needed for itching.   chlorpheniramine (CHLOR-TRIMETON) 4 MG tablet Take 4 mg by mouth daily.   Cranberry Extract 250 MG TABS as directed Orally   [employer] SOD CHLORIDE HYPERTONICITY 5 % ophthalmic ointment PLACE 1 APPLICATION INTO THE RIGHT EYE AT BEDTIME. (Patient taking differently: Place 1 Application into the right eye at bedtime.)   Cyanocobalamin 1000 MCG TBCR 1 tablet Orally Once a day for 30 day(s)   diclofenac Sodium (VOLTAREN) 1 % GEL Apply 2 g topically 4 (four) times daily. Apply to Knee   famotidine (PEPCID) 20 MG tablet Take 20 mg by mouth daily.   FLUZONE HIGH-DOSE QUADRIVALENT 0.7 ML SUSY    furosemide (LASIX) 40 MG tablet Take 20 mg by mouth daily. MONDAY , WEDNESDAY and FRIDAY   GEMTESA 75 MG TABS Take 75 mg by mouth daily.   Loteprednol Etabonate (LOTEMAX SM) 0.38 % GEL Place 1 drop into the right eye 2 (two) times daily.   Menthol, Topical Analgesic, (BIOFREEZE) 4 % GEL Apply 1 Application topically daily as needed (shoulder pain).   metoprolol tartrate (LOPRESSOR) 25 MG tablet Take 0.5 tablets (12.5 mg total) by mouth 2 (two) times daily.   PFIZER-BIONT COVID-19 VAC-TRIS SUSP injection    sodium chloride (MURO 128) 5 % ophthalmic solution INSTILL 1 DROP INTO RIGHT EYE 4 TIMES A DAY   timolol (TIMOPTIC) 0.5 % ophthalmic solution Place 1 drop into both eyes every [name]     Allergies:   Meloxicam, Nitrofurantoin, Other, Guaifenesin er,  and Nitrofuran derivatives   Social History   Socioeconomic History   Marital status: Divorced    Spouse name: Not on file   Number of children: 3   Years of education: 12   Highest education level: Not on file  Occupational History    Comment: retired  Tobacco Use   Smoking status: Never   Smokeless tobacco: Never  Vaping Use   Vaping Use: Never used  Substance and Sexual Activity   Alcohol use: No    Alcohol/week: 0.0 standard drinks of alcohol   Drug use: No   Sexual activity: Never    Birth control/protection: Post-menopausal  Other Topics Concern   Not on file  Social History Narrative   Patient is single and lives at home alone.   Retired   Education 12th grade    Right handed   Caffeine  sometimes   Social Determinants of Health  Financial Resource Strain: Not on file  Food Insecurity: No Food Insecurity (10/21/2022)   Hunger Vital Sign    Worried About Running Out of Food in the Last Year: Never true    Ran Out of Food in the Last Year: Never true  Transportation Needs: No Transportation Needs (10/21/2022)   PRAPARE - Hydrologist (Medical): No    Lack of Transportation (Non-Medical): No  Physical Activity: Not on file  Stress: Not on file  Social Connections: Not on file     Family History: The patient's family history includes Heart disease in her mother; Stroke in her mother.  ROS:   Please see the history of present illness.    All other systems reviewed and are negative.  EKGs/Labs/Other Studies Reviewed:    The following studies were reviewed today:  Echocardiogram 10/22/22 1. Small mid cavitary gradient . Left ventricular ejection fraction, by  estimation, is 60 to 65%. The left ventricle has normal function. The left  ventricle has no regional wall motion abnormalities. There is moderate  left ventricular hypertrophy. Left  ventricular diastolic parameters are consistent with Grade II diastolic  dysfunction  (pseudonormalization). Elevated left ventricular end-diastolic  pressure.   2. Right ventricular systolic function is normal. The right ventricular  size is normal. There is severely elevated pulmonary artery systolic  pressure.   3. Mean gradient across valve in diastole 12 mmHg but MVA normal by PT1/2  secondary to MAC. The mitral valve is normal in structure. Mild mitral  valve regurgitation. No evidence of mitral stenosis. Moderate mitral  annular calcification.   4. Tricuspid valve regurgitation is moderate.   5. The aortic valve is tricuspid. There is moderate calcification of the  aortic valve. There is moderate thickening of the aortic valve. Aortic  valve regurgitation is not visualized. Aortic valve  sclerosis/calcification is present, without any evidence  of aortic stenosis.   6. The inferior vena cava is normal in size with greater than 50%  respiratory variability, suggesting right atrial pressure of 3 mmHg.    Recent Labs: 10/21/2022: TSH 3.088 10/26/2022: ALT 15 10/27/2022: B Natriuretic Peptide 699.9; Magnesium 2.5 10/31/2022: BUN 81; Creatinine, Ser 1.72; Hemoglobin 9.7; Platelets 301; Potassium 4.6; Sodium 137  Recent Lipid Panel    Component Value Date/Time   CHOL 147 08/11/2017 0844   TRIG 128 08/11/2017 0844   HDL 71 08/11/2017 0844   CHOLHDL 2.1 08/11/2017 0844   CHOLHDL 2.2 06/23/2016 0845   VLDL 29 06/23/2016 0845   LDLCALC 50 08/11/2017 0844     Risk Assessment/Calculations:                Physical Exam:    VS:  BP 125/69   Pulse 72   Ht 5' (1.524 m)   Wt 132 lb (59.9 kg)   SpO2 98%   BMI 25.78 kg/m     Wt Readings from Last 3 Encounters:  11/28/22 132 lb (59.9 kg)  10/31/22 128 lb 8.5 oz (58.3 kg)  01/22/22 132 lb (59.9 kg)     GEN:  Elderly female. Sitting in her wheelchair in no acute distress  HEENT: Normal NECK: No JVD when sitting upright  CARDIAC: RRR, no murmurs, rubs, gallops. Radial pulses 2+ bilaterally  RESPIRATORY:   Crackles in bilateral lung bases. Otherwise clear to auscultation. Normal WOB on room air  ABDOMEN: Soft, non-tender, non-distended MUSCULOSKELETAL:  Trace edema in BLE. Lower extremities wrapped. No deformity  SKIN: Warm and dry NEUROLOGIC:  Alert and oriented x 3 PSYCHIATRIC:  Normal affect   ASSESSMENT:    1. Chronic diastolic heart failure (HCC)   2. Pulmonary hypertension, unspecified ([place])   3. Essential hypertension   4. Coronary artery disease involving native coronary artery of native heart without angina pectoris   5. Tricuspid valve disorder   6. Pure hypercholesterolemia    PLAN:    In order of problems listed above:  Chronic Diastolic Heart Failure  Pulmonary HTN  - Patient was admitted from 10/20/22-10/31/22 for treatment of acute on chronic diastolic heart failure. Patient was treated with IV lasix and was net -10.9 L at discharge  - Echocardiogram 1/13 showed EF 60-65%, grade II diastolic dysfunction, severely elevated pulmonary artery systolic pressure - Now, patient denies shortness of breath, orthopnea. Does have mild ankle edema, but edema goes away with compression and lasix. Patient has not been SOB when working with PT  - Currently on lasix 20 mg MWF - Patient has some crackles in lung bases, but is otherwise euvolemic on exam. Dry weight is about 132-134 lbs. Weight today is 132 lbs  - Patient has been getting weekly BMPs drawn at her SNF. Labs were drawn there earlier this AM. Asked that lab results be sent to the office  - Discussed daily weights, low sodium diet. Daughter understands she is to call the office if patient's weight increases by >3 lbs in one day or >5 lbs in one week   HTN - BP well controlled. Patient denies dizziness, lightheadedness, syncope/near syncope  - Continue amlodipine 2.5 mg daily, metoprolol tartrate 12.5 mg BID   CAD  - Patient had an NSTEMI in 08/2015 and underwent successful PCI to the distal RCA with PTCA and insertion of a  DES - Patient denies recent chest pain  - Continue ASA 81 mg daily, metoprolol 12.5 mg BID, lipitor 80 mg daily   Mild Mitral Valve Regurgitation  Moderate tricuspid valve regurgitation  - Noted on echocardiogram from 10/22/22 - Given patients advanced age and as patient is not wanting to consider procedures in the future,  no need to monitor with echocardiograms in the future   HLD  - Continue lipitor 80 mg daily    Medication Adjustments/Labs and Tests Ordered: Current medicines are reviewed at length with the patient today.  Concerns regarding medicines are outlined above.  No orders of the defined types were placed in this encounter.  No orders of the defined types were placed in this encounter.   Patient Instructions  Medication Instructions:  The current medical regimen is effective;  continue present plan and medications as directed. Please refer to the Current Medication list given to you today.  *If you need a refill on your cardiac medications before your next appointment, please call your pharmacy*  Lab Work: PLEASE HAVE  [facility]AP[facility] SEND [place] THE LAB RESULTS FROM TODAY [phone]  If you have labs (blood work) drawn today and your tests are completely normal, you will receive your results only by: [place] (if you have MyChart) OR A paper copy in the mail If you have any lab test that is abnormal or we need to change your treatment, we will call you to review the results.  Testing/Procedures: NONE  Other Instructions PLEASE WEIGH DAILY AND CALL IF YOU GAIN  3 LB/DAILY -OR- 5 LB/WEEKLY   Follow-Up: At [facility], you and your health needs are our priority.  As part of our continuing mission to provide you with exceptional heart  care, we have created designated Provider Care Teams.  These Care Teams include your primary Cardiologist (physician) and Advanced Practice Providers (APPs -  Physician Assistants and Nurse Practitioners) who all work together to  provide you with the care you need, when you need it.  Your next appointment:   6-8 week(s)  Provider:   Coletta Memos, FNP or ANY OTHER APP    Then, Paula Ruths, MD will plan to see you again in 6 month(s).       Signed, Margie Billet, PA-C  11/28/2022 1:23 PM    [place]

## 2022-11-28 ENCOUNTER — Ambulatory Visit: Payer: Medicare Other | Attending: General Practice | Admitting: Cardiology

## 2022-11-28 ENCOUNTER — Encounter: Payer: Self-pay | Admitting: General Practice

## 2022-11-28 VITALS — BP 125/69 | HR 72 | Ht 60.0 in | Wt 132.0 lb

## 2022-11-28 DIAGNOSIS — I079 Rheumatic tricuspid valve disease, unspecified: Secondary | ICD-10-CM

## 2022-11-28 DIAGNOSIS — E7800 Pure hypercholesterolemia, unspecified: Secondary | ICD-10-CM

## 2022-11-28 DIAGNOSIS — I10 Essential (primary) hypertension: Secondary | ICD-10-CM

## 2022-11-28 DIAGNOSIS — I2510 Atherosclerotic heart disease of native coronary artery without angina pectoris: Secondary | ICD-10-CM | POA: Diagnosis not present

## 2022-11-28 DIAGNOSIS — I5032 Chronic diastolic (congestive) heart failure: Secondary | ICD-10-CM

## 2022-11-28 DIAGNOSIS — I272 Pulmonary hypertension, unspecified: Secondary | ICD-10-CM | POA: Diagnosis not present

## 2022-11-28 DIAGNOSIS — Z79899 Other long term (current) drug therapy: Secondary | ICD-10-CM | POA: Diagnosis not present

## 2022-11-28 DIAGNOSIS — I110 Hypertensive heart disease with heart failure: Secondary | ICD-10-CM | POA: Diagnosis not present

## 2022-11-28 NOTE — Patient Instructions (Signed)
Medication Instructions:  The current medical regimen is effective;  continue present plan and medications as directed. Please refer to the Current Medication list given to you today.  *If you need a refill on your cardiac medications before your next appointment, please call your pharmacy*  Lab Work: PLEASE HAVE  CLAPPS SEND [place] THE LAB RESULTS FROM TODAY [phone]  If you have labs (blood work) drawn today and your tests are completely normal, you will receive your results only by: [place] (if you have MyChart) OR A paper copy in the mail If you have any lab test that is abnormal or we need to change your treatment, we will call you to review the results.  Testing/Procedures: NONE  Other Instructions PLEASE WEIGH DAILY AND CALL IF YOU GAIN  3 LB/DAILY -OR- 5 LB/WEEKLY   Follow-Up: At [facility], you and your health needs are our priority.  As part of our continuing mission to provide you with exceptional heart care, we have created designated Provider Care Teams.  These Care Teams include your primary Cardiologist (physician) and Advanced Practice Providers (APPs -  Physician Assistants and Nurse Practitioners) who all work together to provide you with the care you need, when you need it.  Your next appointment:   6-8 week(s)  Provider:   Coletta Memos, FNP or ANY OTHER APP    Then, Kirk Ruths, MD will plan to see you again in 6 month(s).

## 2022-11-29 DIAGNOSIS — Z79899 Other long term (current) drug therapy: Secondary | ICD-10-CM | POA: Diagnosis not present

## 2022-11-30 DIAGNOSIS — M25511 Pain in right shoulder: Secondary | ICD-10-CM | POA: Diagnosis not present

## 2022-11-30 DIAGNOSIS — S91301A Unspecified open wound, right foot, initial encounter: Secondary | ICD-10-CM | POA: Diagnosis not present

## 2022-11-30 DIAGNOSIS — M75121 Complete rotator cuff tear or rupture of right shoulder, not specified as traumatic: Secondary | ICD-10-CM | POA: Diagnosis not present

## 2022-11-30 DIAGNOSIS — L89612 Pressure ulcer of right heel, stage 2: Secondary | ICD-10-CM | POA: Diagnosis not present

## 2022-11-30 DIAGNOSIS — L89622 Pressure ulcer of left heel, stage 2: Secondary | ICD-10-CM | POA: Diagnosis not present

## 2022-11-30 DIAGNOSIS — M25512 Pain in left shoulder: Secondary | ICD-10-CM | POA: Diagnosis not present

## 2022-11-30 DIAGNOSIS — M25522 Pain in left elbow: Secondary | ICD-10-CM | POA: Diagnosis not present

## 2022-12-06 ENCOUNTER — Other Ambulatory Visit: Payer: Self-pay | Admitting: *Deleted

## 2022-12-06 NOTE — Patient Outreach (Signed)
[place] Coordinator follow up.   Update received from Bryson Ha, [place] social worker. Ms. Ruebush discharged from [facility] on 12/03/22. She will have [facility] home health. Has supportive family that is very involved. Family to schedule PCP follow up appointment.   No identifiable [facility] care coordination services.    Marthenia Rolling, MSN, RN,BSN [facility] Acute Care Coordinator [phone] (Direct dial)

## 2022-12-12 ENCOUNTER — Ambulatory Visit: Payer: Medicare Other | Admitting: Podiatry

## 2022-12-12 ENCOUNTER — Ambulatory Visit (INDEPENDENT_AMBULATORY_CARE_PROVIDER_SITE_OTHER): Payer: Medicare Other

## 2022-12-12 ENCOUNTER — Encounter: Payer: Self-pay | Admitting: *Deleted

## 2022-12-12 DIAGNOSIS — F32A Depression, unspecified: Secondary | ICD-10-CM | POA: Diagnosis not present

## 2022-12-12 DIAGNOSIS — M79675 Pain in left toe(s): Secondary | ICD-10-CM

## 2022-12-12 DIAGNOSIS — B351 Tinea unguium: Secondary | ICD-10-CM | POA: Diagnosis not present

## 2022-12-12 DIAGNOSIS — L97512 Non-pressure chronic ulcer of other part of right foot with fat layer exposed: Secondary | ICD-10-CM

## 2022-12-12 DIAGNOSIS — I69354 Hemiplegia and hemiparesis following cerebral infarction affecting left non-dominant side: Secondary | ICD-10-CM | POA: Diagnosis not present

## 2022-12-12 DIAGNOSIS — I872 Venous insufficiency (chronic) (peripheral): Secondary | ICD-10-CM | POA: Diagnosis not present

## 2022-12-12 DIAGNOSIS — G8929 Other chronic pain: Secondary | ICD-10-CM | POA: Diagnosis not present

## 2022-12-12 DIAGNOSIS — I2510 Atherosclerotic heart disease of native coronary artery without angina pectoris: Secondary | ICD-10-CM | POA: Diagnosis not present

## 2022-12-12 DIAGNOSIS — J9601 Acute respiratory failure with hypoxia: Secondary | ICD-10-CM | POA: Diagnosis not present

## 2022-12-12 DIAGNOSIS — D509 Iron deficiency anemia, unspecified: Secondary | ICD-10-CM | POA: Diagnosis not present

## 2022-12-12 DIAGNOSIS — M79674 Pain in right toe(s): Secondary | ICD-10-CM

## 2022-12-12 DIAGNOSIS — M79671 Pain in right foot: Secondary | ICD-10-CM

## 2022-12-12 DIAGNOSIS — L89612 Pressure ulcer of right heel, stage 2: Secondary | ICD-10-CM | POA: Diagnosis not present

## 2022-12-12 DIAGNOSIS — H409 Unspecified glaucoma: Secondary | ICD-10-CM | POA: Diagnosis not present

## 2022-12-12 DIAGNOSIS — E119 Type 2 diabetes mellitus without complications: Secondary | ICD-10-CM | POA: Diagnosis not present

## 2022-12-12 DIAGNOSIS — E785 Hyperlipidemia, unspecified: Secondary | ICD-10-CM | POA: Diagnosis not present

## 2022-12-12 DIAGNOSIS — M109 Gout, unspecified: Secondary | ICD-10-CM | POA: Diagnosis not present

## 2022-12-12 DIAGNOSIS — L89622 Pressure ulcer of left heel, stage 2: Secondary | ICD-10-CM | POA: Diagnosis not present

## 2022-12-12 DIAGNOSIS — N1832 Chronic kidney disease, stage 3b: Secondary | ICD-10-CM | POA: Diagnosis not present

## 2022-12-12 DIAGNOSIS — H547 Unspecified visual loss: Secondary | ICD-10-CM | POA: Diagnosis not present

## 2022-12-12 DIAGNOSIS — E43 Unspecified severe protein-calorie malnutrition: Secondary | ICD-10-CM | POA: Diagnosis not present

## 2022-12-12 DIAGNOSIS — H3530 Unspecified macular degeneration: Secondary | ICD-10-CM | POA: Diagnosis not present

## 2022-12-12 DIAGNOSIS — H35039 Hypertensive retinopathy, unspecified eye: Secondary | ICD-10-CM | POA: Diagnosis not present

## 2022-12-12 DIAGNOSIS — I083 Combined rheumatic disorders of mitral, aortic and tricuspid valves: Secondary | ICD-10-CM | POA: Diagnosis not present

## 2022-12-12 DIAGNOSIS — L97412 Non-pressure chronic ulcer of right heel and midfoot with fat layer exposed: Secondary | ICD-10-CM | POA: Diagnosis not present

## 2022-12-12 DIAGNOSIS — I5033 Acute on chronic diastolic (congestive) heart failure: Secondary | ICD-10-CM | POA: Diagnosis not present

## 2022-12-12 DIAGNOSIS — M1711 Unilateral primary osteoarthritis, right knee: Secondary | ICD-10-CM | POA: Diagnosis not present

## 2022-12-12 DIAGNOSIS — I130 Hypertensive heart and chronic kidney disease with heart failure and stage 1 through stage 4 chronic kidney disease, or unspecified chronic kidney disease: Secondary | ICD-10-CM | POA: Diagnosis not present

## 2022-12-12 DIAGNOSIS — R600 Localized edema: Secondary | ICD-10-CM | POA: Diagnosis not present

## 2022-12-12 DIAGNOSIS — I270 Primary pulmonary hypertension: Secondary | ICD-10-CM | POA: Diagnosis not present

## 2022-12-12 DIAGNOSIS — M25511 Pain in right shoulder: Secondary | ICD-10-CM | POA: Diagnosis not present

## 2022-12-12 DIAGNOSIS — E538 Deficiency of other specified B group vitamins: Secondary | ICD-10-CM | POA: Diagnosis not present

## 2022-12-12 NOTE — Progress Notes (Signed)
Chief Complaint  Patient presents with   Foot Ulcer    Right foot bunion ulcer, Drainage,     Subjective:  89 y.o. female with PMHx of diabetes mellitus presenting today with her granddaughter for follow-up routine foot care and for evaluation of chronic ulcers to the plantar aspect of the right forefoot.  Overall the patient is doing well.  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 ([place])    Fall at home 09/26/2012   Glaucoma    OU   Gout, joint    Hypertension    Hypertensive retinopathy    OU   Macular degeneration    OU   NSTEMI (non-ST elevated myocardial infarction) ([place]) 08/2015   Stroke [facility])    Past Surgical History:  Procedure Laterality Date   ABDOMINAL HYSTERECTOMY     BREAST SURGERY     CYST   CARDIAC CATHETERIZATION N/A 08/18/2015   Procedure: Left Heart Cath and Coronary Angiography;  Surgeon: Troy Sine, MD;  Location: [facility];  Service: Cardiovascular;  Laterality: N/A;   CARDIAC CATHETERIZATION  08/18/2015   Procedure: Coronary Stent Intervention;  Surgeon: Troy Sine, MD;  Location: [facility];  Service: Cardiovascular;;   CATARACT EXTRACTION Bilateral    DILATION AND CURETTAGE OF UTERUS     EYE SURGERY Bilateral    HERNIA REPAIR     IRIDOTOMY / IRIDECTOMY Right    JOINT REPLACEMENT  left knee   REFRACTIVE SURGERY Right 2017   Allergies  Allergen Reactions   Meloxicam Hives   Nitrofurantoin    Other Other (See Comments)   Guaifenesin Er Rash    Other reaction(s): rash   Nitrofuran Derivatives Rash    LT heel 11/14/2022   RT heel 11/14/2022   RT foot 11/14/2022   RT foot 12/12/2022  Objective/Physical Exam General: The patient is alert and oriented x3 in no acute distress.  Dermatology:  Posterior heel ulcers appear significantly improved.  There is increased deterioration of the  ulcer to the bunion area of the right foot.  Tophaceous gout noted within the ulcer with bone exposed.  Clinically there is no aggressive infection or underlying cellulitis.  It appears somewhat stable  Vascular: Chronic bilateral lower extremity edema noted.  No erythema noted today VAS [place] LOWER EXT ART SEG MULTI 06/25/2019 ABI Findings:  +---------+------------------+-----+-----------+--------+  Right   Rt Pressure (mmHg)IndexWaveform   Comment   +---------+------------------+-----+-----------+--------+  Brachial 172                                         +---------+------------------+-----+-----------+--------+  CFA                            triphasic            +---------+------------------+-----+-----------+--------+  Popliteal                      triphasic            +---------+------------------+-----+-----------+--------+  ATA     165               0.96 biphasic             +---------+------------------+-----+-----------+--------+  PTA     179               1.04 multiphasic          +---------+------------------+-----+-----------+--------+  PERO    149               0.87 biphasic             +---------+------------------+-----+-----------+--------+  Great Toe106               0.62 Normal               +---------+------------------+-----+-----------+--------+   +---------+------------------+-----+---------+-------+  Left    Lt Pressure (mmHg)IndexWaveform Comment  +---------+------------------+-----+---------+-------+  Brachial 162                                      +---------+------------------+-----+---------+-------+  CFA                            triphasic         +---------+------------------+-----+---------+-------+  Popliteal                      triphasic         +---------+------------------+-----+---------+-------+  ATA     162               0.94 triphasic          +---------+------------------+-----+---------+-------+  PTA     177               1.03 triphasic         +---------+------------------+-----+---------+-------+  PERO    170               0.99 biphasic          +---------+------------------+-----+---------+-------+  Great Toe143               0.83 Normal            +---------+------------------+-----+---------+-------+   +-------+-----------+-----------+------------+------------+  ABI/TBIToday's ABIToday's TBIPrevious ABIPrevious TBI  +-------+-----------+-----------+------------+------------+  Right 1.04       0.62                                 +-------+-----------+-----------+------------+------------+  Left  1.03       0.83                                 +-------+-----------+-----------+------------+------------+     Right second toe hammertoe deformity, could not obtain PPG tracing  PPG tracings display appropriate pulsatility.    Summary:  Right: Resting right ankle-brachial index is within normal range. No  evidence of significant right lower extremity arterial disease. The right  toe-brachial index is mildly abnormal.   Left: Resting left ankle-brachial index is within normal range. No  evidence of significant left lower extremity arterial disease. The left  toe-brachial index is normal.   Neurological: Light touch and protective threshold diminished bilaterally.   Musculoskeletal Exam: Fat pad atrophy with tenderness to palpation right forefoot.  Palpation of the metatarsal heads noted specifically to the subsecond and third MTPJ  Radiographic exam B/L feet 08/15/2022: Diffuse degenerative changes noted throughout the foot and osteopenia noted which is expected given the patient's age.  No osseous erosions or concern  for underlying bone infection/osteomyelitis.  Posterior heel spurs noted bilateral.  Assessment: 1.  Preulcerative callus right plantar forefoot secondary to diabetes  mellitus 2.  Ulcer posterior heel bilateral 3.  Chronic lower extremity edema  4.  Diabetes mellitus with peripheral polyneuropathy   Plan of Care:  1. Patient was evaluated.  Diabetic foot exam performed today.  We are going to continue to pursue palliative nonsurgical care for the patient.   2.  Unna boot compression wraps were applied to the bilateral lower extremities today.  3.  Medically necessary excisional debridement including subcutaneous tissue was performed using a tissue nipper.  Excisional debridement of all necrotic nonviable tissue down to healthier bleeding viable tissue was performed with postdebridement measurement same as pre- 4.  Continue WBAT postsurgical shoes 5.  Continue home Unna boot wraps at home.  [employer] for Aquacel Ag to be applied to the posterior heels as well as the first MTP of the right foot with the Unna boot wraps 6.  Patient does not wear the offloading heel cushions 7.  Due to the progressive deterioration of the ulcer to the medial aspect of the right first MTP order placed for referral to the [facility]. Always greatly appreciated.  8.  Return to clinic 3 months routine footcare  Edrick Kins, DPM [facility]  Dr. Edrick Kins, DPM    [address], [place] [address]                Office [phone]  Fax [phone]

## 2022-12-13 DIAGNOSIS — H34811 Central retinal vein occlusion, right eye, with macular edema: Secondary | ICD-10-CM | POA: Diagnosis not present

## 2022-12-13 DIAGNOSIS — H20011 Primary iridocyclitis, right eye: Secondary | ICD-10-CM | POA: Diagnosis not present

## 2022-12-13 DIAGNOSIS — H401113 Primary open-angle glaucoma, right eye, severe stage: Secondary | ICD-10-CM | POA: Diagnosis not present

## 2022-12-13 DIAGNOSIS — Z961 Presence of intraocular lens: Secondary | ICD-10-CM | POA: Diagnosis not present

## 2022-12-13 DIAGNOSIS — H40012 Open angle with borderline findings, low risk, left eye: Secondary | ICD-10-CM | POA: Diagnosis not present

## 2022-12-13 DIAGNOSIS — H04123 Dry eye syndrome of bilateral lacrimal glands: Secondary | ICD-10-CM | POA: Diagnosis not present

## 2022-12-13 DIAGNOSIS — H353131 Nonexudative age-related macular degeneration, bilateral, early dry stage: Secondary | ICD-10-CM | POA: Diagnosis not present

## 2022-12-14 ENCOUNTER — Encounter: Payer: Self-pay | Admitting: Podiatry

## 2022-12-14 DIAGNOSIS — I69354 Hemiplegia and hemiparesis following cerebral infarction affecting left non-dominant side: Secondary | ICD-10-CM | POA: Diagnosis not present

## 2022-12-14 DIAGNOSIS — M25511 Pain in right shoulder: Secondary | ICD-10-CM | POA: Diagnosis not present

## 2022-12-14 DIAGNOSIS — L89622 Pressure ulcer of left heel, stage 2: Secondary | ICD-10-CM | POA: Diagnosis not present

## 2022-12-14 DIAGNOSIS — I5033 Acute on chronic diastolic (congestive) heart failure: Secondary | ICD-10-CM | POA: Diagnosis not present

## 2022-12-14 DIAGNOSIS — J9601 Acute respiratory failure with hypoxia: Secondary | ICD-10-CM | POA: Diagnosis not present

## 2022-12-14 DIAGNOSIS — M109 Gout, unspecified: Secondary | ICD-10-CM | POA: Diagnosis not present

## 2022-12-14 DIAGNOSIS — D509 Iron deficiency anemia, unspecified: Secondary | ICD-10-CM | POA: Diagnosis not present

## 2022-12-14 DIAGNOSIS — E785 Hyperlipidemia, unspecified: Secondary | ICD-10-CM | POA: Diagnosis not present

## 2022-12-14 DIAGNOSIS — H35039 Hypertensive retinopathy, unspecified eye: Secondary | ICD-10-CM | POA: Diagnosis not present

## 2022-12-14 DIAGNOSIS — N1832 Chronic kidney disease, stage 3b: Secondary | ICD-10-CM | POA: Diagnosis not present

## 2022-12-14 DIAGNOSIS — H3530 Unspecified macular degeneration: Secondary | ICD-10-CM | POA: Diagnosis not present

## 2022-12-14 DIAGNOSIS — G8929 Other chronic pain: Secondary | ICD-10-CM | POA: Diagnosis not present

## 2022-12-14 DIAGNOSIS — E538 Deficiency of other specified B group vitamins: Secondary | ICD-10-CM | POA: Diagnosis not present

## 2022-12-14 DIAGNOSIS — F32A Depression, unspecified: Secondary | ICD-10-CM | POA: Diagnosis not present

## 2022-12-14 DIAGNOSIS — L97412 Non-pressure chronic ulcer of right heel and midfoot with fat layer exposed: Secondary | ICD-10-CM | POA: Diagnosis not present

## 2022-12-14 DIAGNOSIS — H547 Unspecified visual loss: Secondary | ICD-10-CM | POA: Diagnosis not present

## 2022-12-14 DIAGNOSIS — I083 Combined rheumatic disorders of mitral, aortic and tricuspid valves: Secondary | ICD-10-CM | POA: Diagnosis not present

## 2022-12-14 DIAGNOSIS — H409 Unspecified glaucoma: Secondary | ICD-10-CM | POA: Diagnosis not present

## 2022-12-14 DIAGNOSIS — I130 Hypertensive heart and chronic kidney disease with heart failure and stage 1 through stage 4 chronic kidney disease, or unspecified chronic kidney disease: Secondary | ICD-10-CM | POA: Diagnosis not present

## 2022-12-14 DIAGNOSIS — I270 Primary pulmonary hypertension: Secondary | ICD-10-CM | POA: Diagnosis not present

## 2022-12-14 DIAGNOSIS — E43 Unspecified severe protein-calorie malnutrition: Secondary | ICD-10-CM | POA: Diagnosis not present

## 2022-12-14 DIAGNOSIS — L89612 Pressure ulcer of right heel, stage 2: Secondary | ICD-10-CM | POA: Diagnosis not present

## 2022-12-14 DIAGNOSIS — M1711 Unilateral primary osteoarthritis, right knee: Secondary | ICD-10-CM | POA: Diagnosis not present

## 2022-12-14 DIAGNOSIS — I2510 Atherosclerotic heart disease of native coronary artery without angina pectoris: Secondary | ICD-10-CM | POA: Diagnosis not present

## 2022-12-14 DIAGNOSIS — I872 Venous insufficiency (chronic) (peripheral): Secondary | ICD-10-CM | POA: Diagnosis not present

## 2022-12-19 DIAGNOSIS — M109 Gout, unspecified: Secondary | ICD-10-CM | POA: Diagnosis not present

## 2022-12-19 DIAGNOSIS — L97412 Non-pressure chronic ulcer of right heel and midfoot with fat layer exposed: Secondary | ICD-10-CM | POA: Diagnosis not present

## 2022-12-19 DIAGNOSIS — I270 Primary pulmonary hypertension: Secondary | ICD-10-CM | POA: Diagnosis not present

## 2022-12-19 DIAGNOSIS — M1711 Unilateral primary osteoarthritis, right knee: Secondary | ICD-10-CM | POA: Diagnosis not present

## 2022-12-19 DIAGNOSIS — H547 Unspecified visual loss: Secondary | ICD-10-CM | POA: Diagnosis not present

## 2022-12-19 DIAGNOSIS — E538 Deficiency of other specified B group vitamins: Secondary | ICD-10-CM | POA: Diagnosis not present

## 2022-12-19 DIAGNOSIS — H3530 Unspecified macular degeneration: Secondary | ICD-10-CM | POA: Diagnosis not present

## 2022-12-19 DIAGNOSIS — I2510 Atherosclerotic heart disease of native coronary artery without angina pectoris: Secondary | ICD-10-CM | POA: Diagnosis not present

## 2022-12-19 DIAGNOSIS — I083 Combined rheumatic disorders of mitral, aortic and tricuspid valves: Secondary | ICD-10-CM | POA: Diagnosis not present

## 2022-12-19 DIAGNOSIS — E785 Hyperlipidemia, unspecified: Secondary | ICD-10-CM | POA: Diagnosis not present

## 2022-12-19 DIAGNOSIS — G8929 Other chronic pain: Secondary | ICD-10-CM | POA: Diagnosis not present

## 2022-12-19 DIAGNOSIS — N1832 Chronic kidney disease, stage 3b: Secondary | ICD-10-CM | POA: Diagnosis not present

## 2022-12-19 DIAGNOSIS — H35039 Hypertensive retinopathy, unspecified eye: Secondary | ICD-10-CM | POA: Diagnosis not present

## 2022-12-19 DIAGNOSIS — L89622 Pressure ulcer of left heel, stage 2: Secondary | ICD-10-CM | POA: Diagnosis not present

## 2022-12-19 DIAGNOSIS — L89612 Pressure ulcer of right heel, stage 2: Secondary | ICD-10-CM | POA: Diagnosis not present

## 2022-12-19 DIAGNOSIS — I69354 Hemiplegia and hemiparesis following cerebral infarction affecting left non-dominant side: Secondary | ICD-10-CM | POA: Diagnosis not present

## 2022-12-19 DIAGNOSIS — D509 Iron deficiency anemia, unspecified: Secondary | ICD-10-CM | POA: Diagnosis not present

## 2022-12-19 DIAGNOSIS — M25511 Pain in right shoulder: Secondary | ICD-10-CM | POA: Diagnosis not present

## 2022-12-19 DIAGNOSIS — F32A Depression, unspecified: Secondary | ICD-10-CM | POA: Diagnosis not present

## 2022-12-19 DIAGNOSIS — E43 Unspecified severe protein-calorie malnutrition: Secondary | ICD-10-CM | POA: Diagnosis not present

## 2022-12-19 DIAGNOSIS — I130 Hypertensive heart and chronic kidney disease with heart failure and stage 1 through stage 4 chronic kidney disease, or unspecified chronic kidney disease: Secondary | ICD-10-CM | POA: Diagnosis not present

## 2022-12-19 DIAGNOSIS — H409 Unspecified glaucoma: Secondary | ICD-10-CM | POA: Diagnosis not present

## 2022-12-19 DIAGNOSIS — I872 Venous insufficiency (chronic) (peripheral): Secondary | ICD-10-CM | POA: Diagnosis not present

## 2022-12-19 DIAGNOSIS — I5033 Acute on chronic diastolic (congestive) heart failure: Secondary | ICD-10-CM | POA: Diagnosis not present

## 2022-12-19 DIAGNOSIS — J9601 Acute respiratory failure with hypoxia: Secondary | ICD-10-CM | POA: Diagnosis not present

## 2022-12-21 ENCOUNTER — Other Ambulatory Visit: Payer: Self-pay

## 2022-12-21 ENCOUNTER — Encounter (HOSPITAL_BASED_OUTPATIENT_CLINIC_OR_DEPARTMENT_OTHER): Payer: Medicare Other | Attending: General Surgery | Admitting: Internal Medicine

## 2022-12-21 DIAGNOSIS — L89613 Pressure ulcer of right heel, stage 3: Secondary | ICD-10-CM

## 2022-12-21 DIAGNOSIS — S91301A Unspecified open wound, right foot, initial encounter: Secondary | ICD-10-CM | POA: Diagnosis not present

## 2022-12-21 DIAGNOSIS — L89623 Pressure ulcer of left heel, stage 3: Secondary | ICD-10-CM

## 2022-12-21 DIAGNOSIS — M109 Gout, unspecified: Secondary | ICD-10-CM | POA: Insufficient documentation

## 2022-12-21 DIAGNOSIS — L97822 Non-pressure chronic ulcer of other part of left lower leg with fat layer exposed: Secondary | ICD-10-CM | POA: Diagnosis not present

## 2022-12-21 DIAGNOSIS — I2510 Atherosclerotic heart disease of native coronary artery without angina pectoris: Secondary | ICD-10-CM | POA: Diagnosis not present

## 2022-12-21 DIAGNOSIS — I5032 Chronic diastolic (congestive) heart failure: Secondary | ICD-10-CM | POA: Diagnosis not present

## 2022-12-21 DIAGNOSIS — L89612 Pressure ulcer of right heel, stage 2: Secondary | ICD-10-CM | POA: Diagnosis not present

## 2022-12-21 DIAGNOSIS — I779 Disorder of arteries and arterioles, unspecified: Secondary | ICD-10-CM | POA: Diagnosis not present

## 2022-12-21 DIAGNOSIS — M86671 Other chronic osteomyelitis, right ankle and foot: Secondary | ICD-10-CM | POA: Insufficient documentation

## 2022-12-21 DIAGNOSIS — L97514 Non-pressure chronic ulcer of other part of right foot with necrosis of bone: Secondary | ICD-10-CM | POA: Diagnosis not present

## 2022-12-21 DIAGNOSIS — L89622 Pressure ulcer of left heel, stage 2: Secondary | ICD-10-CM | POA: Diagnosis not present

## 2022-12-21 DIAGNOSIS — L89894 Pressure ulcer of other site, stage 4: Secondary | ICD-10-CM | POA: Insufficient documentation

## 2022-12-21 DIAGNOSIS — M868X7 Other osteomyelitis, ankle and foot: Secondary | ICD-10-CM | POA: Diagnosis not present

## 2022-12-21 DIAGNOSIS — N183 Chronic kidney disease, stage 3 unspecified: Secondary | ICD-10-CM | POA: Insufficient documentation

## 2022-12-21 DIAGNOSIS — M1990 Unspecified osteoarthritis, unspecified site: Secondary | ICD-10-CM | POA: Diagnosis not present

## 2022-12-21 DIAGNOSIS — M86672 Other chronic osteomyelitis, left ankle and foot: Secondary | ICD-10-CM | POA: Insufficient documentation

## 2022-12-21 DIAGNOSIS — K219 Gastro-esophageal reflux disease without esophagitis: Secondary | ICD-10-CM | POA: Insufficient documentation

## 2022-12-21 DIAGNOSIS — I130 Hypertensive heart and chronic kidney disease with heart failure and stage 1 through stage 4 chronic kidney disease, or unspecified chronic kidney disease: Secondary | ICD-10-CM | POA: Diagnosis not present

## 2022-12-21 DIAGNOSIS — I872 Venous insufficiency (chronic) (peripheral): Secondary | ICD-10-CM | POA: Diagnosis not present

## 2022-12-21 DIAGNOSIS — L97412 Non-pressure chronic ulcer of right heel and midfoot with fat layer exposed: Secondary | ICD-10-CM | POA: Diagnosis not present

## 2022-12-22 DIAGNOSIS — I69354 Hemiplegia and hemiparesis following cerebral infarction affecting left non-dominant side: Secondary | ICD-10-CM | POA: Diagnosis not present

## 2022-12-22 DIAGNOSIS — J9601 Acute respiratory failure with hypoxia: Secondary | ICD-10-CM | POA: Diagnosis not present

## 2022-12-22 DIAGNOSIS — E43 Unspecified severe protein-calorie malnutrition: Secondary | ICD-10-CM | POA: Diagnosis not present

## 2022-12-22 DIAGNOSIS — E538 Deficiency of other specified B group vitamins: Secondary | ICD-10-CM | POA: Diagnosis not present

## 2022-12-22 DIAGNOSIS — R350 Frequency of micturition: Secondary | ICD-10-CM | POA: Diagnosis not present

## 2022-12-22 DIAGNOSIS — I872 Venous insufficiency (chronic) (peripheral): Secondary | ICD-10-CM | POA: Diagnosis not present

## 2022-12-22 DIAGNOSIS — F32A Depression, unspecified: Secondary | ICD-10-CM | POA: Diagnosis not present

## 2022-12-22 DIAGNOSIS — H547 Unspecified visual loss: Secondary | ICD-10-CM | POA: Diagnosis not present

## 2022-12-22 DIAGNOSIS — I2510 Atherosclerotic heart disease of native coronary artery without angina pectoris: Secondary | ICD-10-CM | POA: Diagnosis not present

## 2022-12-22 DIAGNOSIS — I5033 Acute on chronic diastolic (congestive) heart failure: Secondary | ICD-10-CM | POA: Diagnosis not present

## 2022-12-22 DIAGNOSIS — I083 Combined rheumatic disorders of mitral, aortic and tricuspid valves: Secondary | ICD-10-CM | POA: Diagnosis not present

## 2022-12-22 DIAGNOSIS — H35039 Hypertensive retinopathy, unspecified eye: Secondary | ICD-10-CM | POA: Diagnosis not present

## 2022-12-22 DIAGNOSIS — M25511 Pain in right shoulder: Secondary | ICD-10-CM | POA: Diagnosis not present

## 2022-12-22 DIAGNOSIS — H3530 Unspecified macular degeneration: Secondary | ICD-10-CM | POA: Diagnosis not present

## 2022-12-22 DIAGNOSIS — M109 Gout, unspecified: Secondary | ICD-10-CM | POA: Diagnosis not present

## 2022-12-22 DIAGNOSIS — E785 Hyperlipidemia, unspecified: Secondary | ICD-10-CM | POA: Diagnosis not present

## 2022-12-22 DIAGNOSIS — M1711 Unilateral primary osteoarthritis, right knee: Secondary | ICD-10-CM | POA: Diagnosis not present

## 2022-12-22 DIAGNOSIS — L89622 Pressure ulcer of left heel, stage 2: Secondary | ICD-10-CM | POA: Diagnosis not present

## 2022-12-22 DIAGNOSIS — L89612 Pressure ulcer of right heel, stage 2: Secondary | ICD-10-CM | POA: Diagnosis not present

## 2022-12-22 DIAGNOSIS — I130 Hypertensive heart and chronic kidney disease with heart failure and stage 1 through stage 4 chronic kidney disease, or unspecified chronic kidney disease: Secondary | ICD-10-CM | POA: Diagnosis not present

## 2022-12-22 DIAGNOSIS — I270 Primary pulmonary hypertension: Secondary | ICD-10-CM | POA: Diagnosis not present

## 2022-12-22 DIAGNOSIS — D509 Iron deficiency anemia, unspecified: Secondary | ICD-10-CM | POA: Diagnosis not present

## 2022-12-22 DIAGNOSIS — L97412 Non-pressure chronic ulcer of right heel and midfoot with fat layer exposed: Secondary | ICD-10-CM | POA: Diagnosis not present

## 2022-12-22 DIAGNOSIS — N1832 Chronic kidney disease, stage 3b: Secondary | ICD-10-CM | POA: Diagnosis not present

## 2022-12-22 DIAGNOSIS — H409 Unspecified glaucoma: Secondary | ICD-10-CM | POA: Diagnosis not present

## 2022-12-22 DIAGNOSIS — G8929 Other chronic pain: Secondary | ICD-10-CM | POA: Diagnosis not present

## 2022-12-22 NOTE — Progress Notes (Signed)
[name], [name] ([id number]) [id number] Page 1 of 4 Visit Report for 12/21/2022 Abuse Risk Screen Details Patient Name: Date of Service: [name], [name] 12/21/2022 1:15 PM Medical Record Number: [id number] Patient Account Number: [id number] Date of Birth/Sex: Treating RN: 11/03/1922 (89 y.o. Elam Dutch Primary Care Erline Siddoway: [name] Other Clinician: Referring Eiley Mcginnity: Treating Dontrey Snellgrove/Extender: Barbie Banner in Treatment: 0 Abuse Risk Screen Items Answer ABUSE RISK SCREEN: Has anyone close to you tried to hurt or harm you recentlyo No Do you feel uncomfortable with anyone in your familyo No Has anyone forced you do things that you didnt want to doo No Electronic Signature(s) Signed: 12/21/2022 5:03:32 PM By: Baruch Gouty RN, BSN Entered By: Baruch Gouty on 12/21/2022 13:49:37 -------------------------------------------------------------------------------- Activities of Daily Living Details Patient Name: Date of Service: [name], [name] 12/21/2022 1:15 PM Medical Record Number: [id number] Patient Account Number: [id number] Date of Birth/Sex: Treating RN: [birth year] (89 y.o. Elam Dutch Primary Care Kahne Helfand: [name] Other Clinician: Referring Azelea Seguin: Treating Macdonald Rigor/Extender: Barbie Banner in Treatment: 0 Activities of Daily Living Items Answer Activities of Daily Living (Please select one for each item) Drive Automobile Not Able T Medications ake Need Assistance Use T elephone Completely Able Care for Appearance Need Assistance Use T oilet Need Assistance Bath / Shower Need Assistance Dress Self Need Assistance Feed Self Completely Able Walk Need Assistance Get In / Out Bed Need Assistance Housework Need Assistance Prepare Meals Need Assistance Handle Money Need Assistance Shop for Self Need Assistance Electronic Signature(s) Signed: 12/21/2022 5:03:32  PM By: Baruch Gouty RN, BSN Entered By: Baruch Gouty on 12/21/2022 13:50:31 -------------------------------------------------------------------------------- Education Screening Details Patient Name: Date of Service: [name], [name] 12/21/2022 1:15 PM Medical Record Number: [id number] Patient Account Number: [id number] Date of Birth/Sex: Treating RN: 02-01-1923 (89 y.o. Elam Dutch Primary Care Josselyne Onofrio: [name] Other Clinician: Referring Quantavius Humm: Treating Providence Stivers/Extender: Barbie Banner in Treatment: 0 [name], [name] ([id number]) [id number] Page 2 of 4 Primary Learner Assessed: Patient Learning Preferences/Education Level/Primary Language Learning Preference: Explanation, Demonstration, Printed Material Highest Education Level: High School Preferred Language: Diplomatic Services operational officer Language Barrier: No Translator Needed: No Memory Deficit: No Emotional Barrier: No Cultural/Religious Beliefs Affecting Medical Care: No Physical Barrier Impaired Vision: Yes Impaired Hearing: Yes Decreased Hand dexterity: No Knowledge/Comprehension Knowledge Level: Medium Comprehension Level: Medium Ability to understand written instructions: Medium Ability to understand verbal instructions: Medium Motivation Anxiety Level: Calm Cooperation: Cooperative Education Importance: Acknowledges Need Interest in Health Problems: Asks Questions Perception: Coherent Willingness to Engage in Self-Management High Activities: Readiness to Engage in Self-Management High Activities: Electronic Signature(s) Signed: 12/21/2022 5:03:32 PM By: Baruch Gouty RN, BSN Entered By: Baruch Gouty on 12/21/2022 13:51:14 -------------------------------------------------------------------------------- Fall Risk Assessment Details Patient Name: Date of Service: [name] [name] 12/21/2022 1:15 PM Medical Record Number:  [id number] Patient Account Number: [id number] Date of Birth/Sex: Treating RN: 15-Mar-1923 (89 y.o. Elam Dutch Primary Care Deshunda Thackston: [name] Other Clinician: Referring Gurnoor Sloop: Treating Daviel Allegretto/Extender: Barbie Banner in Treatment: 0 Fall Risk Assessment Items Have you had 2 or more falls in the last 12 monthso 0 Yes Have you had any fall that resulted in injury in the last 12 monthso 0 No FALLS RISK SCREEN History of falling - immediate or within 3 months 25 Yes Secondary diagnosis (Do you have 2 or more medical diagnoseso) 0 No Ambulatory aid None/bed rest/wheelchair/nurse 0  No Crutches/cane/walker 15 Yes Furniture 0 No Intravenous therapy Access/Saline/Heparin Lock 0 No Gait/Transferring Normal/ bed rest/ wheelchair 0 No Weak (short steps with or without shuffle, stooped but able to lift head while walking, may seek 10 Yes support from furniture) Impaired (short steps with shuffle, may have difficulty arising from chair, head down, impaired 0 No balance) Mental Status Oriented to own ability 0 Yes Overestimates or forgets limitations 0 No Risk Level: Medium Risk Score: 50 [name], [name] ([id number]) [id number] Page 3 of 4 Electronic Signature(s) -------------------------------------------------------------------------------- Foot Assessment Details Patient Name: Date of Service: [name], [name] 12/21/2022 1:15 PM Medical Record Number: [id number] Patient Account Number: [id number] Date of Birth/Sex: Treating RN: 06/24/1923 (89 y.o. Elam Dutch Primary Care Benton Tooker: [name] Other Clinician: Referring Hellon Vaccarella: Treating Garnetta Fedrick/Extender: Barbie Banner in Treatment: 0 Foot Assessment Items Site Locations + = Sensation present, - = Sensation absent, [name] = Callus, U = Ulcer R = Redness, W = Warmth, M = Maceration, PU = Pre-ulcerative lesion F = Fissure, S = Swelling, D  = Dryness Assessment Right: Left: Other Deformity: No No Prior Foot Ulcer: No No Prior Amputation: No No Charcot Joint: No No Ambulatory Status: Ambulatory With Help Assistance Device: Walker Gait: Administrator, arts) Signed: 12/21/2022 5:03:32 PM By: Baruch Gouty RN, BSN Entered By: Baruch Gouty on 12/21/2022 13:54:13 -------------------------------------------------------------------------------- Nutrition Risk Screening Details Patient Name: Date of Service: [facility] [place], [name] 12/21/2022 1:15 PM Medical Record Number: [id number] Patient Account Number: [id number] Date of Birth/Sex: Treating RN: 12-Jan-1923 (89 y.o. Elam Dutch Primary Care Jerzy Roepke: [name] Other Clinician: Referring Mikah Rottinghaus: Treating Vernadine Coombs/Extender: Barbie Banner in Treatment: 0 Height (in): 60 Weight (lbs): 130 Body Mass Index (BMI): 25.4 [name], [name] ([id number]) [id number] Page 4 of 4 Nutrition Risk Screening Items Score Screening NUTRITION RISK SCREEN: I have an illness or condition that made me change the kind and/or amount of food I eat 0 No I eat fewer than two meals per day 0 No I eat few fruits and vegetables, or milk products 0 No I have three or more drinks of beer, liquor or wine almost every day 0 No I have tooth or mouth problems that make it hard for me to eat 0 No I don't always have enough money to buy the food I need 0 No I eat alone most of the time 0 No I take three or more different prescribed or over-the-counter drugs a day 1 Yes Without wanting to, I have lost or gained 10 pounds in the last six months 0 No I am not always physically able to shop, cook and/or feed myself 2 Yes Nutrition Protocols Good Risk Protocol Moderate Risk Protocol 0 Provide education on nutrition High Risk Proctocol Risk Level: Moderate Risk Score: 3 Electronic Signature(s) Signed: 12/21/2022 5:03:32 PM By:  Baruch Gouty RN, BSN Entered By: Baruch Gouty on 12/21/2022 13:52:57

## 2022-12-23 DIAGNOSIS — H409 Unspecified glaucoma: Secondary | ICD-10-CM | POA: Diagnosis not present

## 2022-12-23 DIAGNOSIS — F32A Depression, unspecified: Secondary | ICD-10-CM | POA: Diagnosis not present

## 2022-12-23 DIAGNOSIS — I2510 Atherosclerotic heart disease of native coronary artery without angina pectoris: Secondary | ICD-10-CM | POA: Diagnosis not present

## 2022-12-23 DIAGNOSIS — N1832 Chronic kidney disease, stage 3b: Secondary | ICD-10-CM | POA: Diagnosis not present

## 2022-12-23 DIAGNOSIS — M109 Gout, unspecified: Secondary | ICD-10-CM | POA: Diagnosis not present

## 2022-12-23 DIAGNOSIS — E785 Hyperlipidemia, unspecified: Secondary | ICD-10-CM | POA: Diagnosis not present

## 2022-12-23 DIAGNOSIS — H547 Unspecified visual loss: Secondary | ICD-10-CM | POA: Diagnosis not present

## 2022-12-23 DIAGNOSIS — D509 Iron deficiency anemia, unspecified: Secondary | ICD-10-CM | POA: Diagnosis not present

## 2022-12-23 DIAGNOSIS — I270 Primary pulmonary hypertension: Secondary | ICD-10-CM | POA: Diagnosis not present

## 2022-12-23 DIAGNOSIS — E538 Deficiency of other specified B group vitamins: Secondary | ICD-10-CM | POA: Diagnosis not present

## 2022-12-23 DIAGNOSIS — H35039 Hypertensive retinopathy, unspecified eye: Secondary | ICD-10-CM | POA: Diagnosis not present

## 2022-12-23 DIAGNOSIS — M1711 Unilateral primary osteoarthritis, right knee: Secondary | ICD-10-CM | POA: Diagnosis not present

## 2022-12-23 DIAGNOSIS — I872 Venous insufficiency (chronic) (peripheral): Secondary | ICD-10-CM | POA: Diagnosis not present

## 2022-12-23 DIAGNOSIS — I083 Combined rheumatic disorders of mitral, aortic and tricuspid valves: Secondary | ICD-10-CM | POA: Diagnosis not present

## 2022-12-23 DIAGNOSIS — L89622 Pressure ulcer of left heel, stage 2: Secondary | ICD-10-CM | POA: Diagnosis not present

## 2022-12-23 DIAGNOSIS — I69354 Hemiplegia and hemiparesis following cerebral infarction affecting left non-dominant side: Secondary | ICD-10-CM | POA: Diagnosis not present

## 2022-12-23 DIAGNOSIS — G8929 Other chronic pain: Secondary | ICD-10-CM | POA: Diagnosis not present

## 2022-12-23 DIAGNOSIS — L97412 Non-pressure chronic ulcer of right heel and midfoot with fat layer exposed: Secondary | ICD-10-CM | POA: Diagnosis not present

## 2022-12-23 DIAGNOSIS — I5033 Acute on chronic diastolic (congestive) heart failure: Secondary | ICD-10-CM | POA: Diagnosis not present

## 2022-12-23 DIAGNOSIS — M25511 Pain in right shoulder: Secondary | ICD-10-CM | POA: Diagnosis not present

## 2022-12-23 DIAGNOSIS — H3530 Unspecified macular degeneration: Secondary | ICD-10-CM | POA: Diagnosis not present

## 2022-12-23 DIAGNOSIS — L89612 Pressure ulcer of right heel, stage 2: Secondary | ICD-10-CM | POA: Diagnosis not present

## 2022-12-23 DIAGNOSIS — I130 Hypertensive heart and chronic kidney disease with heart failure and stage 1 through stage 4 chronic kidney disease, or unspecified chronic kidney disease: Secondary | ICD-10-CM | POA: Diagnosis not present

## 2022-12-23 DIAGNOSIS — E43 Unspecified severe protein-calorie malnutrition: Secondary | ICD-10-CM | POA: Diagnosis not present

## 2022-12-23 DIAGNOSIS — J9601 Acute respiratory failure with hypoxia: Secondary | ICD-10-CM | POA: Diagnosis not present

## 2022-12-23 NOTE — Progress Notes (Signed)
HAYSLEE, GENSCH ([id number]) [id number] Page 1 of 10 Visit Report for 12/21/2022 Chief Complaint Document Details Patient Name: Date of Service: [facility] [place], [place] 12/21/2022 1:15 PM Medical Record Number: [id number] Patient Account Number: [id number] Date of Birth/Sex: Treating RN: 01-Apr-1923 (89 y.o. F) Primary Care Provider: [name] Other Clinician: Referring Provider: Treating Provider/Extender: Barbie Banner in Treatment: 0 Information Obtained from: Patient Chief Complaint 12/21/2022; bilateral heel wounds, right medial foot wound and left posterior leg wound Electronic Signature(s) Signed: 12/22/2022 3:34:03 PM By: Kalman Shan DO Entered By: Kalman Shan on 12/21/2022 16:59:49 -------------------------------------------------------------------------------- Debridement Details Patient Name: Date of Service: [facility] [place], [place] C. 12/21/2022 1:15 PM Medical Record Number: [id number] Patient Account Number: [id number] Date of Birth/Sex: Treating RN: [birth year] (89 y.o. Elam Dutch Primary Care Provider: [name] Other Clinician: Referring Provider: Treating Provider/Extender: Barbie Banner in Treatment: 0 Debridement Performed for Assessment: Wound #1 Right,Medial Metatarsal head first Performed By: Physician Kalman Shan, DO Debridement Type: Debridement Level of Consciousness (Pre-procedure): Awake and Alert Pre-procedure Verification/Time Out Yes - 14:50 Taken: Start Time: 14:50 Pain Control: Lidocaine 4% T opical Solution T Area Debrided (L x W): otal 1 (cm) x 1.7 (cm) = 1.7 (cm) Tissue and other material debrided: Viable, Non-Viable, Bone Level: Skin/Subcutaneous Tissue/Muscle/Bone Debridement Description: Excisional Instrument: Nippers Specimen: Tissue Culture Number of Specimens T aken: 1 Bleeding: Minimum Hemostasis Achieved: Pressure Procedural Pain: 0 Post  Procedural Pain: 0 Response to Treatment: Procedure was tolerated well Level of Consciousness (Post- Awake and Alert procedure): Post Debridement Measurements of Total Wound Length: (cm) 1 Stage: Category/Stage IV Width: (cm) 1.7 Depth: (cm) 0.3 Volume: (cm) 0.401 Character of Wound/Ulcer Post Debridement: Stable Post Procedure Diagnosis Same as Pre-procedure Electronic Signature(s) Signed: 12/21/2022 5:03:32 PM By: Baruch Gouty RN, BSN Signed: 12/22/2022 3:34:03 PM By: Kalman Shan DO Entered By: Baruch Gouty on 12/21/2022 [place], [place] ([id number]) [id number] Page 2 of 10 -------------------------------------------------------------------------------- HPI Details Patient Name: Date of Service: [facility] [place], [place] 12/21/2022 1:15 PM Medical Record Number: [id number] Patient Account Number: [id number] Date of Birth/Sex: Treating RN: September 01, 1923 (89 y.o. F) Primary Care Provider: [name] Other Clinician: Referring Provider: Treating Provider/Extender: Barbie Banner in Treatment: 0 History of Present Illness HPI Description: 12/21/2022 Ms. Mackinlee Halderman is a 89 year old female with a past medical history of CVA, CAD, and chronic diastolic heart failure that presents to the clinic for a 1 month history of nonhealing ulcers to her feet and a 1 week history of wound to her left leg. She states that she was in the hospital for heart failure in January and developed pressure ulcers to her feet and heels. She has been following with podiatry for this issue. They have been using Aquacel Ag to the wound beds. She has also been having her legs wrapped in Unna boots (by her PCP) to help with swelling however she does not have wounds to her legs that are being treated with the wrap. The Unna boot wrap started cutting into the back of her left leg and now she has an area open. She currently denies signs of infection. She  has been using Prevalon boots to her feet bilaterally. Electronic Signature(s) Signed: 12/22/2022 3:34:03 PM By: Kalman Shan DO Entered By: Kalman Shan on 12/21/2022 17:03:06 -------------------------------------------------------------------------------- Physical Exam Details Patient Name: Date of Service: [facility], [place] C. 12/21/2022 1:15 PM Medical Record Number: [id number] Patient Account Number: [id number] Date  of Birth/Sex: Treating RN: [birth year] (89 y.o. F) Primary Care Provider: [name] Other Clinician: Referring Provider: Treating Provider/Extender: Barbie Banner in Treatment: 0 Constitutional respirations regular, non-labored and within target range for patient.. Cardiovascular 2+ dorsalis pedis/posterior tibialis pulses. Psychiatric pleasant and cooperative. Notes Bilateral heel wounds with granulation tissue at the opening. T the right medial foot there is a large open wound with bone easily visible. T the left posterior leg o o there is an area of skin breakdown caused by the Unna boot cutting into her. No signs of acute soft tissue infection to any of the wound beds. Electronic Signature(s) Signed: 12/22/2022 3:34:03 PM By: Kalman Shan DO Entered By: Kalman Shan on 12/21/2022 17:03:55 -------------------------------------------------------------------------------- Physician Orders Details Patient Name: Date of Service: [facility] [place], [name] R[place] C. 12/21/2022 1:15 PM Medical Record Number: [id number] Patient Account Number: [id number] Date of Birth/Sex: Treating RN: Dec 21, 1922 (89 y.o. Elam Dutch Primary Care Provider: [name] Other Clinician: Referring Provider: Treating Provider/Extender: Barbie Banner in Treatment: 0 Verbal / Phone Orders: No Diagnosis Coding ICD-10 Coding Code Description L89.613 Pressure ulcer of right heel, stage 3 PRACHI, ASSINK C ([id number])  [phone] Page 3 of 10 [phone] Pressure ulcer of left heel, stage 3 L97.822 Non-pressure chronic ulcer of other part of left lower leg with fat layer exposed I87.312 Chronic venous hypertension (idiopathic) with ulcer of left lower extremity L97.514 Non-pressure chronic ulcer of other part of right foot with necrosis of bone Follow-up Appointments ppointment in 1 week. - Dr. Heber Struble Return A Anesthetic (In clinic) Topical Lidocaine 4% applied to wound bed Bathing/ Shower/ Hygiene May shower and wash wound with soap and water. Edema Control - Lymphedema / SCD / Other Compression stocking or Garment 20-30 mm/Hg pressure to: - compression stockings both legs daily or tubigrip Off-Loading Other: - heel protectors both heels especially while in bed Home Health New wound care orders this week; continue Home Health for wound care. May utilize formulary equivalent dressing for wound treatment orders unless otherwise specified. Other Home Health Orders/Instructions: - [facility] Wound Treatment Wound #1 - Metatarsal head first Wound Laterality: Right, Medial Prim Dressing: Hydrofera Blue Ready Transfer Foam, 2.5x2.5 (in/in) 3 x Per Week/30 Days ary Discharge Instructions: Apply directly to wound bed as directed Secondary Dressing: Woven Gauze Sponge, Non-Sterile 4x4 in 3 x Per Week/30 Days Discharge Instructions: Apply over primary dressing as directed. Secured With: [employer], 4.5x3.1 (in/yd) 3 x Per Week/30 Days Discharge Instructions: Secure with Kerlix as directed. Compression Wrap: tubigrip 3 x Per Week/30 Days Discharge Instructions: apply in morning and may remove at night Wound #2 - Calcaneus Wound Laterality: Right Prim Dressing: Hydrofera Blue Ready Transfer Foam, 2.5x2.5 (in/in) 3 x Per Week/30 Days ary Discharge Instructions: Apply directly to wound bed as directed Secondary Dressing: Zetuvit Plus Silicone Border Dressing 4x4 (in/in) 3 x Per  Week/30 Days Discharge Instructions: Apply silicone border over primary dressing as directed. Wound #3 - Lower Leg Wound Laterality: Left, Posterior Prim Dressing: Hydrofera Blue Ready Transfer Foam, 2.5x2.5 (in/in) 3 x Per Week/30 Days ary Discharge Instructions: Apply directly to wound bed as directed Secondary Dressing: Zetuvit Plus Silicone Border Dressing 4x4 (in/in) 3 x Per Week/30 Days Discharge Instructions: Apply silicone border over primary dressing as directed. Secondary Dressing: tubigrip or stocking 3 x Per Week/30 Days Wound #4 - Calcaneus Wound Laterality: Left Prim Dressing: Hydrofera Blue Ready Transfer Foam, 2.5x2.5 (in/in) 3 x Per Week/30 Days ary Discharge Instructions: Apply directly  to wound bed as directed Secondary Dressing: Zetuvit Plus Silicone Border Dressing 4x4 (in/in) 3 x Per Week/30 Days Discharge Instructions: Apply silicone border over primary dressing as directed. Laboratory naerobe culture (MICRO) - right 1st met head bone Bacteria identified in Unspecified specimen by A LOINC Code: [id number] Convenience Name: Anaerobic culture Bacteria identified in Tissue by Biopsy culture (MICRO) - right 1st met head bone LOINC Code: 20474-3 Convenience Name: Biopsy specimen culture EMAURI, FOXALL ([id number]) [id number] Page 4 of 10 Patient Medications llergies: meloxicam, nitrofurantoin, guaifenesin, nitrofuran derivative A Notifications Medication Indication Start End prior to debridement 12/21/2022 lidocaine DOSE topical 4 % cream - cream topical Electronic Signature(s) Signed: 12/22/2022 3:34:03 PM By: Kalman Shan DO Previous Signature: 12/21/2022 5:03:32 PM Version By: Baruch Gouty RN, BSN Entered By: Kalman Shan on 12/21/2022 17:04:27 -------------------------------------------------------------------------------- Problem List Details Patient Name: Date of Service: [facility] [place], [name] R[place] C. 12/21/2022 1:15 PM Medical Record  Number: [id number] Patient Account Number: [id number] Date of Birth/Sex: Treating RN: 22-Jun-1923 (89 y.o. F) Primary Care Provider: [name] Other Clinician: Referring Provider: Treating Provider/Extender: Barbie Banner in Treatment: 0 Active Problems ICD-10 Encounter Code Description Active Date MDM Diagnosis L89.613 Pressure ulcer of right heel, stage 3 12/21/2022 No Yes L89.623 Pressure ulcer of left heel, stage 3 12/21/2022 No Yes L97.822 Non-pressure chronic ulcer of other part of left lower leg with fat layer exposed3/13/2024 No Yes I87.312 Chronic venous hypertension (idiopathic) with ulcer of left lower extremity 12/21/2022 No Yes L89.894 Pressure ulcer of other site, stage 4 12/21/2022 No Yes S91.301A Unspecified open wound, right foot, initial encounter 12/21/2022 No Yes Inactive Problems Resolved Problems Electronic Signature(s) Signed: 12/22/2022 3:34:03 PM By: Kalman Shan DO Entered By: Kalman Shan on 12/21/2022 17:08:53 -------------------------------------------------------------------------------- Progress Note Details Patient Name: Date of Service: [facility], [place]. 12/21/2022 1:15 PM Medical Record Number: [id number] Patient Account Number: [id number] Date of Birth/Sex: Treating RN: Jul 17, 1923 (89 y.o. Sharra Zerangue, Leilani Able ([id number]) [id number] Page 5 of 10 Primary Care Provider: [name] Other Clinician: Referring Provider: Treating Provider/Extender: Barbie Banner in Treatment: 0 Subjective Chief Complaint Information obtained from Patient 12/21/2022; bilateral heel wounds, right medial foot wound and left posterior leg wound History of Present Illness (HPI) 12/21/2022 Ms. Zakyrah Reuss is a 89 year old female with a past medical history of CVA, CAD, and chronic diastolic heart failure that presents to the clinic for a 1 month history of nonhealing ulcers to her feet and a 1  week history of wound to her left leg. She states that she was in the hospital for heart failure in January and developed pressure ulcers to her feet and heels. She has been following with podiatry for this issue. They have been using Aquacel Ag to the wound beds. She has also been having her legs wrapped in Unna boots (by her PCP) to help with swelling however she does not have wounds to her legs that are being treated with the wrap. The Unna boot wrap started cutting into the back of her left leg and now she has an area open. She currently denies signs of infection. She has been using Prevalon boots to her feet bilaterally. Patient History Information obtained from Patient, Chart. Allergies meloxicam (Reaction: hives), nitrofurantoin, guaifenesin (Reaction: rash), nitrofuran derivative (Reaction: rash) Family History Heart Disease - Mother, Stroke - Mother. Social History Never smoker, Marital Status - Divorced, Alcohol Use - Never, Drug Use - No History, Caffeine Use - Rarely. Medical History Eyes  Patient has history of Cataracts - bil extractions, Glaucoma Hematologic/Lymphatic Patient has history of Anemia Cardiovascular Patient has history of Congestive Heart Failure, Coronary Artery Disease, Hypertension Genitourinary Denies history of End Stage Renal Disease Integumentary (Skin) Denies history of History of Burn Musculoskeletal Patient has history of Gout, Osteoarthritis Denies history of Rheumatoid Arthritis, Osteomyelitis Neurologic Denies history of Neuropathy Oncologic Denies history of Received Chemotherapy, Received Radiation Psychiatric Patient has history of Confinement Anxiety Denies history of Anorexia/bulimia Hospitalization/Surgery History - 11/16 NSTEMI. - 11/16 heart caths. - breast surgery. - hysterectomy. - extraction cataract. - left knee replacement. Medical A Surgical History Notes nd Constitutional Symptoms (General Health) CVA TIA Eyes hypertension  retinopathy macular degeneration Ear/Nose/Mouth/Throat hard of hearing Gastrointestinal GERD Genitourinary stage III CKD UTIs, incontinence Neurologic CVA Review of Systems (ROS) Constitutional Symptoms (General Health) Complains or has symptoms of Fatigue. Eyes Complains or has symptoms of Glasses / Contacts - reading. Ear/Nose/Mouth/Throat Denies complaints or symptoms of Chronic sinus problems or rhinitis. Respiratory Complains or has symptoms of Chronic or frequent coughs. Cardiovascular Denies complaints or symptoms of Chest pain. Gastrointestinal Denies complaints or symptoms of Frequent diarrhea, Nausea, Vomiting. Endocrine Denies complaints or symptoms of Heat/cold intolerance. Genitourinary Complains or has symptoms of Frequent urination. Integumentary (Skin) MAIZIE, UPTHEGROVE ([id number]) [id number] Page 6 of 10 Complains or has symptoms of Wounds - right foot. Musculoskeletal Complains or has symptoms of Muscle Weakness. Neurologic Denies complaints or symptoms of Numbness/parasthesias. Psychiatric Complains or has symptoms of Claustrophobia. Objective Constitutional respirations regular, non-labored and within target range for patient.. Vitals Time Taken: 1:38 PM, Height: 60 in, Weight: 130 lbs, BMI: 25.4, Temperature: 97.5 F, Pulse: 80 bpm, Respiratory Rate: 18 breaths/min, Blood Pressure: 139/70 mmHg. Cardiovascular 2+ dorsalis pedis/posterior tibialis pulses. Psychiatric pleasant and cooperative. General Notes: Bilateral heel wounds with granulation tissue at the opening. T the right medial foot there is a large open wound with bone easily visible. T the o o left posterior leg there is an area of skin breakdown caused by the Unna boot cutting into her. No signs of acute soft tissue infection to any of the wound beds. Integumentary (Hair, Skin) Wound #1 status is Open. Original cause of wound was Gradually Appeared. The date  acquired was: 12/07/2022. The wound is located on the Right,Medial Metatarsal head first. The wound measures 1cm length x 1.7cm width x 0.3cm depth; 1.335cm^2 area and 0.401cm^3 volume. There is bone and Fat Layer (Subcutaneous Tissue) exposed. There is no tunneling or undermining noted. There is a small amount of serosanguineous drainage noted. The wound margin is distinct with the outline attached to the wound base. There is no granulation within the wound bed. There is a medium (34-66%) amount of necrotic tissue within the wound bed including Adherent Slough. The periwound skin appearance exhibited: Callus, Hemosiderin Staining. The periwound skin appearance did not exhibit: Dry/Scaly, Maceration. Periwound temperature was noted as Cool/Cold. The periwound has tenderness on palpation. Wound #2 status is Open. Original cause of wound was Pressure Injury. The date acquired was: 10/24/2022. The wound is located on the Right Calcaneus. The wound measures 0.4cm length x 0.7cm width x 0.1cm depth; 0.22cm^2 area and 0.022cm^3 volume. There is Fat Layer (Subcutaneous Tissue) exposed. There is no tunneling or undermining noted. There is a medium amount of serous drainage noted. The wound margin is distinct with the outline attached to the wound base. There is no granulation within the wound bed. There is a large (67-100%) amount of necrotic tissue within the wound bed  including [place]. The periwound skin appearance had no abnormalities noted for texture. The periwound skin appearance had no abnormalities noted for color. The periwound skin appearance exhibited: Dry/Scaly. The periwound skin appearance did not exhibit: Maceration. Periwound temperature was noted as No Abnormality. The periwound has tenderness on palpation. Wound #3 status is Open. Original cause of wound was Pressure Injury. The date acquired was: 12/21/2022. The wound is located on the Left,Posterior Lower Leg. The wound measures 1.2cm  length x 2.3cm width x 0.1cm depth; 2.168cm^2 area and 0.217cm^3 volume. There is Fat Layer (Subcutaneous Tissue) exposed. There is no tunneling or undermining noted. There is a medium amount of serosanguineous drainage noted. The wound margin is flat and intact. There is medium (34-66%) red granulation within the wound bed. There is a medium (34-66%) amount of necrotic tissue within the wound bed including Adherent Slough. The periwound skin appearance had no abnormalities noted for texture. The periwound skin appearance had no abnormalities noted for moisture. The periwound skin appearance exhibited: Erythema. The surrounding wound skin color is noted with erythema. Periwound temperature was noted as No Abnormality. The periwound has tenderness on palpation. Wound #4 status is Open. Original cause of wound was Pressure Injury. The date acquired was: 10/24/2022. The wound is located on the Left Calcaneus. The wound measures 0.4cm length x 0.5cm width x 0.1cm depth; 0.157cm^2 area and 0.016cm^3 volume. There is Fat Layer (Subcutaneous Tissue) exposed. There is no tunneling or undermining noted. There is a medium amount of serosanguineous drainage noted. The wound margin is distinct with the outline attached to the wound base. There is large (67-100%) pink, pale granulation within the wound bed. There is a small (1-33%) amount of necrotic tissue within the wound bed including Adherent Slough. The periwound skin appearance had no abnormalities noted for color. The periwound skin appearance exhibited: Callus, Dry/Scaly. Periwound temperature was noted as No Abnormality. The periwound has tenderness on palpation. Assessment Active Problems ICD-10 Pressure ulcer of right heel, stage 3 Pressure ulcer of left heel, stage 3 Non-pressure chronic ulcer of other part of left lower leg with fat layer exposed Chronic venous hypertension (idiopathic) with ulcer of left lower extremity Pressure ulcer of other  site, stage 4 Unspecified open wound, right foot, initial encounter Patient presents with a 1 month history of pressure ulcers to her feet bilaterally. T the heel wounds I recommended Hydrofera Blue and aggressive o offloading. She has Prevalon boots and I recommended she wear these at all times. She knows she cannot transfer with these on. She needs help ambulating and cannot do so without assistance so I recommended her caregiver take the boots off when she transfers and ambulates with a walker. T the o right medial foot there is exposed bone. granddaughter was present during the encounter. Her Aunt was on the phone. We discussed the treatment options MARIM, KLOPP ([id number]) [phone].pdf Page 7 of 10 for osteomyelitis. This includes amputation versus long course of antibiotics. He would like to proceed with a course of antibiotics. I went ahead and obtained a bone culture and bone biopsy. She can use Hydrofera Blue here as well. The left posterior leg wound was caused by friction from her Unna boot. At this time I recommended she wear compression stockings she can also placed Hydrofera Blue here. Follow-up in 1 week. Procedures Wound #1 Pre-procedure diagnosis of Wound #1 is a Pressure Ulcer located on the Right,Medial Metatarsal head first . There was a Excisional Skin/Subcutaneous Tissue/Muscle/Bone Debridement with a total  area of 1.7 sq cm performed by Kalman Shan, DO. With the following instrument(s): Nippers to remove Viable and Non-Viable tissue/material. Material removed includes Bone after achieving pain control using Lidocaine 4% T opical Solution. 1 specimen was taken by a Tissue Culture and sent to the lab per facility protocol. A time out was conducted at 14:50, prior to the start of the procedure. A Minimum amount of bleeding was controlled with Pressure. The procedure was tolerated well with a pain level of 0 throughout and a pain level of 0  following the procedure. Post Debridement Measurements: 1cm length x 1.7cm width x 0.3cm depth; 0.401cm^3 volume. Post debridement Stage noted as Category/Stage IV. Character of Wound/Ulcer Post Debridement is stable. Post procedure Diagnosis Wound #1: Same as Pre-Procedure Plan Follow-up Appointments: Return Appointment in 1 week. - Dr. Heber [place] Anesthetic: (In clinic) Topical Lidocaine 4% applied to wound bed Bathing/ Shower/ Hygiene: May shower and wash wound with soap and water. Edema Control - Lymphedema / SCD / Other: Compression stocking or Garment 20-30 mm/Hg pressure to: - compression stockings both legs daily or tubigrip Off-Loading: Other: - heel protectors both heels especially while in bed Home Health: New wound care orders this week; continue Home Health for wound care. May utilize formulary equivalent dressing for wound treatment orders unless otherwise specified. Other Home Health Orders/Instructions: - [facility] Laboratory ordered were: Anaerobic culture - right 1st met head bone, Biopsy specimen culture - right 1st met head bone The following medication(s) was prescribed: lidocaine topical 4 % cream cream topical for prior to debridement was prescribed at facility WOUND #1: - Metatarsal head first Wound Laterality: Right, Medial Prim Dressing: Hydrofera Blue Ready Transfer Foam, 2.5x2.5 (in/in) 3 x Per Week/30 Days ary Discharge Instructions: Apply directly to wound bed as directed Secondary Dressing: Woven Gauze Sponge, Non-Sterile 4x4 in 3 x Per Week/30 Days Discharge Instructions: Apply over primary dressing as directed. Secured With: [employer], 4.5x3.1 (in/yd) 3 x Per Week/30 Days Discharge Instructions: Secure with Kerlix as directed. Com pression Wrap: tubigrip 3 x Per Week/30 Days Discharge Instructions: apply in morning and may remove at night WOUND #2: - Calcaneus Wound Laterality: Right Prim Dressing: Hydrofera Blue Ready Transfer Foam, 2.5x2.5  (in/in) 3 x Per Week/30 Days ary Discharge Instructions: Apply directly to wound bed as directed Secondary Dressing: Zetuvit Plus Silicone Border Dressing 4x4 (in/in) 3 x Per Week/30 Days Discharge Instructions: Apply silicone border over primary dressing as directed. WOUND #3: - Lower Leg Wound Laterality: Left, Posterior Prim Dressing: Hydrofera Blue Ready Transfer Foam, 2.5x2.5 (in/in) 3 x Per Week/30 Days ary Discharge Instructions: Apply directly to wound bed as directed Secondary Dressing: Zetuvit Plus Silicone Border Dressing 4x4 (in/in) 3 x Per Week/30 Days Discharge Instructions: Apply silicone border over primary dressing as directed. Secondary Dressing: tubigrip or stocking 3 x Per Week/30 Days WOUND #4: - Calcaneus Wound Laterality: Left Prim Dressing: Hydrofera Blue Ready Transfer Foam, 2.5x2.5 (in/in) 3 x Per Week/30 Days ary Discharge Instructions: Apply directly to wound bed as directed Secondary Dressing: Zetuvit Plus Silicone Border Dressing 4x4 (in/in) 3 x Per Week/30 Days Discharge Instructions: Apply silicone border over primary dressing as directed. 1. Bone biopsy and culture 2. Hydrofera Blue 3. Aggressive offloadingooPrevalon boot 4. Follow-up in 1 week 5. Compression stockings daily Electronic Signature(s) Signed: 12/22/2022 3:34:03 PM By: Kalman Shan DO Signed: 12/22/2022 4:49:10 PM By: Deon Pilling RN, BSN Entered By: Deon Pilling on 12/22/2022 09:23:04 [name] ([id number] Page 8 of 10 -------------------------------------------------------------------------------- HxROS  Details Patient Name: Date of Service: [facility] [place], [place] 12/21/2022 1:15 PM Medical Record Number: [id number] Patient Account Number: [id number] Date of Birth/Sex: Treating RN: November 05, 1922 (89 y.o. Helene Shoe, Tammi Klippel Primary Care Provider: [name] Other Clinician: Referring Provider: Treating Provider/Extender: Barbie Banner in Treatment: 0 Information Obtained From Patient Chart Constitutional Symptoms (General Health) Complaints and Symptoms: Positive for: Fatigue Medical History: Past Medical History Notes: CVA TIA Eyes Complaints and Symptoms: Positive for: Glasses / Contacts - reading Medical History: Positive for: Cataracts - bil extractions; Glaucoma Past Medical History Notes: hypertension retinopathy macular degeneration Ear/Nose/Mouth/Throat Complaints and Symptoms: Negative for: Chronic sinus problems or rhinitis Medical History: Past Medical History Notes: hard of hearing Respiratory Complaints and Symptoms: Positive for: Chronic or frequent coughs Cardiovascular Complaints and Symptoms: Negative for: Chest pain Medical History: Positive for: Congestive Heart Failure; Coronary Artery Disease; Hypertension Gastrointestinal Complaints and Symptoms: Negative for: Frequent diarrhea; Nausea; Vomiting Medical History: Past Medical History Notes: GERD Endocrine Complaints and Symptoms: Negative for: Heat/cold intolerance Genitourinary Complaints and Symptoms: Positive for: Frequent urination Medical History: Negative for: End Stage Renal Disease Past Medical History Notes: stage III CKD UTIs, incontinence Integumentary (Skin) Complaints and Symptoms: ARCHISHA, ESKOLA ([id number]) [id number] Page 9 of 10 Positive for: Wounds - right foot Medical History: Negative for: History of Burn Musculoskeletal Complaints and Symptoms: Positive for: Muscle Weakness Medical History: Positive for: Gout; Osteoarthritis Negative for: Rheumatoid Arthritis; Osteomyelitis Neurologic Complaints and Symptoms: Negative for: Numbness/parasthesias Medical History: Negative for: Neuropathy Past Medical History Notes: CVA Psychiatric Complaints and Symptoms: Positive for: Claustrophobia Medical History: Positive for: Confinement  Anxiety Negative for: Anorexia/bulimia Hematologic/Lymphatic Medical History: Positive for: Anemia Immunological Oncologic Medical History: Negative for: Received Chemotherapy; Received Radiation HBO Extended History Items Eyes: Eyes: Cataracts Glaucoma Immunizations Pneumococcal Vaccine: Received Pneumococcal Vaccination: Yes Received Pneumococcal Vaccination On or After 60th Birthday: Yes Implantable Devices No devices added Hospitalization / Surgery History Type of Hospitalization/Surgery 11/16 NSTEMI 11/16 heart caths breast surgery hysterectomy extraction cataract left knee replacement Family and Social History Heart Disease: Yes - Mother; Stroke: Yes - Mother; Never smoker; Marital Status - Divorced; Alcohol Use: Never; Drug Use: No History; Caffeine Use: Rarely; Financial Concerns: No; Food, Clothing or Shelter Needs: No; Support System Lacking: No; Transportation Concerns: No Engineer, maintenance) Signed: 12/21/2022 5:03:32 PM By: Baruch Gouty RN, BSN Signed: 12/21/2022 5:26:11 PM By: Deon Pilling RN, BSN Signed: 12/22/2022 3:34:03 PM By: Kalman Shan DO Previous Signature: 12/21/2022 11:58:37 AM Version By: Kalman Shan DO Entered By: Baruch Gouty on 12/21/2022 13:49:29 KIMBRELY, TORI ([id number]) [id number] Page 10 of 10 -------------------------------------------------------------------------------- SuperBill Details Patient Name: Date of Service: [facility] [place], [place] 12/21/2022 Medical Record Number: [id number] Patient Account Number: [id number] Date of Birth/Sex: Treating RN: 1923/07/28 (89 y.o. Elam Dutch Primary Care Provider: [name] Other Clinician: Referring Provider: Treating Provider/Extender: Barbie Banner in Treatment: 0 Diagnosis Coding ICD-10 Codes Code Description [phone] Pressure ulcer of right heel, stage 3 L89.623 Pressure ulcer of left heel, stage 3 L97.822  Non-pressure chronic ulcer of other part of left lower leg with fat layer exposed I87.312 Chronic venous hypertension (idiopathic) with ulcer of left lower extremity L89.894 Pressure ulcer of other site, stage 4 S91.301A Unspecified open wound, right foot, initial encounter Facility Procedures : CPT4 Code: [id number] Description: 99213 - WOUND CARE VISIT-LEV 3 EST PT Modifier: 25 Quantity: 1 : CPT4 Code: [id number] Description: [id number] - DEB BONE 20 SQ CM/< ICD-10 Diagnosis Description  [id number] Pressure ulcer of other site, stage 4 S91.301A Unspecified open wound, right foot, initial encounter Modifier: Quantity: 1 Physician Procedures : CPT4: Description Modifier Code [id number] - WC PHYS LEVEL 4 - NEW PT ICD-10 Diagnosis Description L89.613 Pressure ulcer of right heel, stage 3 L89.623 Pressure ulcer of left heel, stage 3 L97.822 Non-pressure chronic ulcer of other part of left  lower leg with fat layer exposed L89.894 Pressure ulcer of other site, stage 4 Quantity: 1 : [id number] Debridement; bone (includes epidermis, dermis, subQ tissue, muscle and/or fascia, if performed) 1st 20 sqcm or less ICD-10 Diagnosis Description L89.894 Pressure ulcer of other site, stage 4 S91.301A Unspecified open wound, right foot,  initial encounter Quantity: 1 Electronic Signature(s) Signed: 12/22/2022 3:34:03 PM By: Kalman Shan DO Previous Signature: 12/21/2022 5:03:32 PM Version By: Baruch Gouty RN, BSN Entered By: Kalman Shan on 12/21/2022 17:09:15

## 2022-12-26 ENCOUNTER — Telehealth: Payer: Self-pay | Admitting: *Deleted

## 2022-12-26 DIAGNOSIS — G8929 Other chronic pain: Secondary | ICD-10-CM | POA: Diagnosis not present

## 2022-12-26 DIAGNOSIS — D509 Iron deficiency anemia, unspecified: Secondary | ICD-10-CM | POA: Diagnosis not present

## 2022-12-26 DIAGNOSIS — L97412 Non-pressure chronic ulcer of right heel and midfoot with fat layer exposed: Secondary | ICD-10-CM | POA: Diagnosis not present

## 2022-12-26 DIAGNOSIS — I5033 Acute on chronic diastolic (congestive) heart failure: Secondary | ICD-10-CM | POA: Diagnosis not present

## 2022-12-26 DIAGNOSIS — J9601 Acute respiratory failure with hypoxia: Secondary | ICD-10-CM | POA: Diagnosis not present

## 2022-12-26 DIAGNOSIS — S91302D Unspecified open wound, left foot, subsequent encounter: Secondary | ICD-10-CM | POA: Diagnosis not present

## 2022-12-26 DIAGNOSIS — H3530 Unspecified macular degeneration: Secondary | ICD-10-CM | POA: Diagnosis not present

## 2022-12-26 DIAGNOSIS — E785 Hyperlipidemia, unspecified: Secondary | ICD-10-CM | POA: Diagnosis not present

## 2022-12-26 DIAGNOSIS — M1711 Unilateral primary osteoarthritis, right knee: Secondary | ICD-10-CM | POA: Diagnosis not present

## 2022-12-26 DIAGNOSIS — I130 Hypertensive heart and chronic kidney disease with heart failure and stage 1 through stage 4 chronic kidney disease, or unspecified chronic kidney disease: Secondary | ICD-10-CM | POA: Diagnosis not present

## 2022-12-26 DIAGNOSIS — M25511 Pain in right shoulder: Secondary | ICD-10-CM | POA: Diagnosis not present

## 2022-12-26 DIAGNOSIS — L89622 Pressure ulcer of left heel, stage 2: Secondary | ICD-10-CM | POA: Diagnosis not present

## 2022-12-26 DIAGNOSIS — I083 Combined rheumatic disorders of mitral, aortic and tricuspid valves: Secondary | ICD-10-CM | POA: Diagnosis not present

## 2022-12-26 DIAGNOSIS — N1832 Chronic kidney disease, stage 3b: Secondary | ICD-10-CM | POA: Diagnosis not present

## 2022-12-26 DIAGNOSIS — H35039 Hypertensive retinopathy, unspecified eye: Secondary | ICD-10-CM | POA: Diagnosis not present

## 2022-12-26 DIAGNOSIS — E43 Unspecified severe protein-calorie malnutrition: Secondary | ICD-10-CM | POA: Diagnosis not present

## 2022-12-26 DIAGNOSIS — H547 Unspecified visual loss: Secondary | ICD-10-CM | POA: Diagnosis not present

## 2022-12-26 DIAGNOSIS — L89612 Pressure ulcer of right heel, stage 2: Secondary | ICD-10-CM | POA: Diagnosis not present

## 2022-12-26 DIAGNOSIS — F32A Depression, unspecified: Secondary | ICD-10-CM | POA: Diagnosis not present

## 2022-12-26 DIAGNOSIS — E538 Deficiency of other specified B group vitamins: Secondary | ICD-10-CM | POA: Diagnosis not present

## 2022-12-26 DIAGNOSIS — I872 Venous insufficiency (chronic) (peripheral): Secondary | ICD-10-CM | POA: Diagnosis not present

## 2022-12-26 DIAGNOSIS — I69354 Hemiplegia and hemiparesis following cerebral infarction affecting left non-dominant side: Secondary | ICD-10-CM | POA: Diagnosis not present

## 2022-12-26 DIAGNOSIS — I270 Primary pulmonary hypertension: Secondary | ICD-10-CM | POA: Diagnosis not present

## 2022-12-26 DIAGNOSIS — I2510 Atherosclerotic heart disease of native coronary artery without angina pectoris: Secondary | ICD-10-CM | POA: Diagnosis not present

## 2022-12-26 DIAGNOSIS — M109 Gout, unspecified: Secondary | ICD-10-CM | POA: Diagnosis not present

## 2022-12-26 DIAGNOSIS — H409 Unspecified glaucoma: Secondary | ICD-10-CM | POA: Diagnosis not present

## 2022-12-26 LAB — AEROBIC/ANAEROBIC CULTURE W GRAM STAIN (SURGICAL/DEEP WOUND): Gram Stain: NONE SEEN

## 2022-12-26 NOTE — Patient Outreach (Signed)
  Care Coordination   Follow Up Visit Note   12/26/2022 Name: [name] MRN: [id number] DOB: 11-Nov-1922  [name] is a 89 y.o. year old female who sees [name], [place] for primary care. I  spoke with pt's daughter, [name], by phone today.  What matters to the patients health and wellness today?  Was recently discharged to home from SNF rehab- has [facility] RN, PT and would like to consider Care Coordination program in a few weeks.    Goals Addressed   None     SDOH assessments and interventions completed:  Yes  SDOH Interventions Today    Flowsheet Row Most Recent Value  SDOH Interventions   Housing Interventions Intervention Not Indicated  Transportation Interventions Intervention Not Indicated  Physical Activity Interventions --  [currently receiving HHPT and RN visits (wound care) and is s/p SNF rehab]        Care Coordination Interventions:  Yes, provided  Interventions Today    Flowsheet Row Most Recent Value  Chronic Disease   Chronic disease during today's visit Congestive Heart Failure (CHF)  General Interventions   General Interventions Discussed/Reviewed General Interventions Discussed, Level of Care, Community Resources  Level of Care --  [recently released from SNF rehab to home with HH]        Follow up plan: Follow up call scheduled for 01/18/23    Encounter Outcome:  Pt. Visit Completed

## 2022-12-27 ENCOUNTER — Encounter (HOSPITAL_BASED_OUTPATIENT_CLINIC_OR_DEPARTMENT_OTHER): Payer: Medicare Other | Admitting: Internal Medicine

## 2022-12-27 DIAGNOSIS — L97822 Non-pressure chronic ulcer of other part of left lower leg with fat layer exposed: Secondary | ICD-10-CM

## 2022-12-27 DIAGNOSIS — M1711 Unilateral primary osteoarthritis, right knee: Secondary | ICD-10-CM | POA: Diagnosis not present

## 2022-12-27 DIAGNOSIS — I5032 Chronic diastolic (congestive) heart failure: Secondary | ICD-10-CM | POA: Diagnosis not present

## 2022-12-27 DIAGNOSIS — N183 Chronic kidney disease, stage 3 unspecified: Secondary | ICD-10-CM | POA: Diagnosis not present

## 2022-12-27 DIAGNOSIS — D509 Iron deficiency anemia, unspecified: Secondary | ICD-10-CM | POA: Diagnosis not present

## 2022-12-27 DIAGNOSIS — E538 Deficiency of other specified B group vitamins: Secondary | ICD-10-CM | POA: Diagnosis not present

## 2022-12-27 DIAGNOSIS — H547 Unspecified visual loss: Secondary | ICD-10-CM | POA: Diagnosis not present

## 2022-12-27 DIAGNOSIS — L89894 Pressure ulcer of other site, stage 4: Secondary | ICD-10-CM | POA: Diagnosis not present

## 2022-12-27 DIAGNOSIS — K219 Gastro-esophageal reflux disease without esophagitis: Secondary | ICD-10-CM | POA: Diagnosis not present

## 2022-12-27 DIAGNOSIS — I69354 Hemiplegia and hemiparesis following cerebral infarction affecting left non-dominant side: Secondary | ICD-10-CM | POA: Diagnosis not present

## 2022-12-27 DIAGNOSIS — I872 Venous insufficiency (chronic) (peripheral): Secondary | ICD-10-CM | POA: Diagnosis not present

## 2022-12-27 DIAGNOSIS — E785 Hyperlipidemia, unspecified: Secondary | ICD-10-CM | POA: Diagnosis not present

## 2022-12-27 DIAGNOSIS — J9601 Acute respiratory failure with hypoxia: Secondary | ICD-10-CM | POA: Diagnosis not present

## 2022-12-27 DIAGNOSIS — F32A Depression, unspecified: Secondary | ICD-10-CM | POA: Diagnosis not present

## 2022-12-27 DIAGNOSIS — I779 Disorder of arteries and arterioles, unspecified: Secondary | ICD-10-CM | POA: Diagnosis not present

## 2022-12-27 DIAGNOSIS — I270 Primary pulmonary hypertension: Secondary | ICD-10-CM | POA: Diagnosis not present

## 2022-12-27 DIAGNOSIS — G8929 Other chronic pain: Secondary | ICD-10-CM | POA: Diagnosis not present

## 2022-12-27 DIAGNOSIS — M25511 Pain in right shoulder: Secondary | ICD-10-CM | POA: Diagnosis not present

## 2022-12-27 DIAGNOSIS — H3530 Unspecified macular degeneration: Secondary | ICD-10-CM | POA: Diagnosis not present

## 2022-12-27 DIAGNOSIS — L97514 Non-pressure chronic ulcer of other part of right foot with necrosis of bone: Secondary | ICD-10-CM | POA: Diagnosis not present

## 2022-12-27 DIAGNOSIS — I083 Combined rheumatic disorders of mitral, aortic and tricuspid valves: Secondary | ICD-10-CM | POA: Diagnosis not present

## 2022-12-27 DIAGNOSIS — E43 Unspecified severe protein-calorie malnutrition: Secondary | ICD-10-CM | POA: Diagnosis not present

## 2022-12-27 DIAGNOSIS — M1990 Unspecified osteoarthritis, unspecified site: Secondary | ICD-10-CM | POA: Diagnosis not present

## 2022-12-27 DIAGNOSIS — I2510 Atherosclerotic heart disease of native coronary artery without angina pectoris: Secondary | ICD-10-CM | POA: Diagnosis not present

## 2022-12-27 DIAGNOSIS — R350 Frequency of micturition: Secondary | ICD-10-CM | POA: Diagnosis not present

## 2022-12-27 DIAGNOSIS — M86672 Other chronic osteomyelitis, left ankle and foot: Secondary | ICD-10-CM | POA: Diagnosis not present

## 2022-12-27 DIAGNOSIS — H409 Unspecified glaucoma: Secondary | ICD-10-CM | POA: Diagnosis not present

## 2022-12-27 DIAGNOSIS — L89622 Pressure ulcer of left heel, stage 2: Secondary | ICD-10-CM | POA: Diagnosis not present

## 2022-12-27 DIAGNOSIS — H35039 Hypertensive retinopathy, unspecified eye: Secondary | ICD-10-CM | POA: Diagnosis not present

## 2022-12-27 DIAGNOSIS — L89613 Pressure ulcer of right heel, stage 3: Secondary | ICD-10-CM

## 2022-12-27 DIAGNOSIS — M86671 Other chronic osteomyelitis, right ankle and foot: Secondary | ICD-10-CM | POA: Diagnosis not present

## 2022-12-27 DIAGNOSIS — M109 Gout, unspecified: Secondary | ICD-10-CM | POA: Diagnosis not present

## 2022-12-27 DIAGNOSIS — N1832 Chronic kidney disease, stage 3b: Secondary | ICD-10-CM | POA: Diagnosis not present

## 2022-12-27 DIAGNOSIS — I130 Hypertensive heart and chronic kidney disease with heart failure and stage 1 through stage 4 chronic kidney disease, or unspecified chronic kidney disease: Secondary | ICD-10-CM | POA: Diagnosis not present

## 2022-12-27 DIAGNOSIS — L89623 Pressure ulcer of left heel, stage 3: Secondary | ICD-10-CM | POA: Diagnosis not present

## 2022-12-27 DIAGNOSIS — L97412 Non-pressure chronic ulcer of right heel and midfoot with fat layer exposed: Secondary | ICD-10-CM | POA: Diagnosis not present

## 2022-12-27 DIAGNOSIS — L89612 Pressure ulcer of right heel, stage 2: Secondary | ICD-10-CM | POA: Diagnosis not present

## 2022-12-27 DIAGNOSIS — I5033 Acute on chronic diastolic (congestive) heart failure: Secondary | ICD-10-CM | POA: Diagnosis not present

## 2022-12-28 NOTE — Progress Notes (Addendum)
[name] ([id number]) [id number] Page 1 of 8 Visit Report for 12/27/2022 Chief Complaint Document Details Patient Name: Date of Service: [facility] [place], [place] 12/27/2022 8:45 A M Medical Record Number: [id number] Patient Account Number: [id number] Date of Birth/Sex: Treating RN: [birth year] (89 y.o. F) Primary Care Provider: [name] Other Clinician: Referring Provider: Treating Provider/Extender: Barbie Banner in Treatment: 0 Information Obtained from: Patient Chief Complaint 12/21/2022; bilateral heel wounds, right medial foot wound and left posterior leg wound Electronic Signature(s) Signed: 12/27/2022 5:03:25 PM By: Kalman Shan DO Entered By: Kalman Shan on 12/27/2022 11:45:52 -------------------------------------------------------------------------------- HPI Details Patient Name: Date of Service: Paula Sorrow, MA RY C. 12/27/2022 8:45 A M Medical Record Number: [id number] Patient Account Number: [id number] Date of Birth/Sex: Treating RN: December 31, 1922 (89 y.o. F) Primary Care Provider: [name] Other Clinician: Referring Provider: Treating Provider/Extender: Barbie Banner in Treatment: 0 History of Present Illness HPI Description: 12/21/2022 Ms. [name] is a 89 year old female with a past medical history of CVA, CAD, and chronic diastolic heart failure that presents to the clinic for a 1 month history of nonhealing ulcers to her feet and a 1 week history of wound to her left leg. She states that she was in the hospital for heart failure in January and developed pressure ulcers to her feet and heels. She has been following with podiatry for this issue. They have been using Aquacel Ag to the wound beds. She has also been having her legs wrapped in Unna boots (by her PCP) to help with swelling however she does not have wounds to her legs that are being treated with the wrap. The Unna boot wrap  started cutting into the back of her left leg and now she has an area open. She currently denies signs of infection. She has been using Prevalon boots to her feet bilaterally. 3/19; patient presents for follow-up. She had a bone biopsy done at last clinic visit that was positive for osteomyelitis. She had a culture done as well that showed Staphylococcus lugdunensis and Stenotrophomonas maltophilia. Patient been using Hydrofera Blue to the wound beds. Overall she denies any signs of infection. She is being referred to infectious disease. Electronic Signature(s) Signed: 12/27/2022 5:03:25 PM By: Kalman Shan DO Entered By: Kalman Shan on 12/27/2022 11:46:15 -------------------------------------------------------------------------------- Physical Exam Details Patient Name: Date of Service: [name], [place] C. 12/27/2022 8:45 A M Medical Record Number: [id number] Patient Account Number: [id number] Date of Birth/Sex: Treating RN: 20-Jun-1923 (89 y.o. F) Primary Care Provider: [name] Other Clinician: Referring Provider: Treating Provider/Extender: Barbie Banner in Treatment: 0 Constitutional respirations regular, non-labored and within target range for patient.. Cardiovascular 2+ dorsalis pedis/posterior tibialis pulses. Psychiatric VANESA, ANTONE ([id number]) [id number] Page 2 of 8 pleasant and cooperative. Notes Right heel wound with granulation tissue at the opening. T the left heel epithelialization to the previous wound. T the right medial foot there is a large open o o wound with bone easily visible. T the left posterior leg there is an area of skin breakdown. No signs of acute soft tissue infection to any of the wound beds. o Electronic Signature(s) Signed: 12/27/2022 5:03:25 PM By: Kalman Shan DO Entered By: Kalman Shan on 12/27/2022  11:47:30 -------------------------------------------------------------------------------- Physician Orders Details Patient Name: Date of Service: [name], [name] RY C. 12/27/2022 8:45 A M Medical Record Number: [id number] Patient Account Number: [id number] Date of Birth/Sex: Treating RN: 08-23-1923 (89 y.o. [name], Lauren  Primary Care Provider: [name] Other Clinician: Referring Provider: Treating Provider/Extender: Barbie Banner in Treatment: 0 Verbal / Phone Orders: No Diagnosis Coding Follow-up Appointments Return appointment in 3 weeks. - w/ Dr. Heber [place] and Morey Hummingbird rm # 7 Tuesday 01/17/23 @ 9:45 Anesthetic (In clinic) Topical Lidocaine 4% applied to wound bed Bathing/ Shower/ Hygiene May shower and wash wound with soap and water. Edema Control - Lymphedema / SCD / Other Compression stocking or Garment 20-30 mm/Hg pressure to: - compression stockings both legs daily or tubigrip Off-Loading Other: - heel protectors both heels especially while in bed Home Health New wound care orders this week; continue Home Health for wound care. May utilize formulary equivalent dressing for wound treatment orders unless otherwise specified. Other Home Health Orders/Instructions: - [facility] Wound Treatment Wound #1 - Metatarsal head first Wound Laterality: Right, Medial Prim Dressing: Hydrofera Blue Ready Transfer Foam, 2.5x2.5 (in/in) 1 x Per Day/30 Days ary Discharge Instructions: Apply directly to wound bed as directed Secondary Dressing: Woven Gauze Sponge, Non-Sterile 4x4 in 1 x Per Day/30 Days Discharge Instructions: Apply over primary dressing as directed. Secured With: [employer], 4.5x3.1 (in/yd) 1 x Per Day/30 Days Discharge Instructions: Secure with Kerlix as directed. Compression Wrap: tubigrip 1 x Per Day/30 Days Discharge Instructions: apply in morning and may remove at night Wound #2 - Calcaneus Wound Laterality: Right Prim Dressing:  Hydrofera Blue Ready Transfer Foam, 2.5x2.5 (in/in) 3 x Per Week/30 Days ary Discharge Instructions: Apply directly to wound bed as directed Secondary Dressing: Zetuvit Plus Silicone Border Dressing 4x4 (in/in) 3 x Per Week/30 Days Discharge Instructions: Apply silicone border over primary dressing as directed. Wound #3 - Lower Leg Wound Laterality: Left, Posterior Prim Dressing: Hydrofera Blue Ready Transfer Foam, 2.5x2.5 (in/in) 3 x Per Week/30 Days ary Discharge Instructions: Apply directly to wound bed as directed Secondary Dressing: Zetuvit Plus Silicone Border Dressing 4x4 (in/in) 3 x Per Week/30 Days Discharge Instructions: Apply silicone border over primary dressing as directed. Secondary Dressing: tubigrip or stocking 3 x Per Week/30 Days LICIA, HARL ([id number]) [phone].pdf Page 3 of 8 Consults Infectious Disease - Urgent Referral for positive bone biopsy confirming osteomyelitis of right foot wound. Electronic Signature(s) Signed: 12/27/2022 5:03:25 PM By: Kalman Shan DO Entered By: Kalman Shan on 12/27/2022 11:47:43 Prescription 12/27/2022 -------------------------------------------------------------------------------- Magda Bernheim C. Kalman Shan DO Patient Name: Provider: [birth year] [id number] Date of Birth: NPI#: [id number] Sex: DEA #: [phone] Phone #: License #: [place] Patient Address: [address] [place], [place] [address] [place], [place] [address] [phone] Allergies meloxicam; nitrofurantoin; guaifenesin; nitrofuran derivative Provider's Orders Infectious Disease - Urgent Referral for positive bone biopsy confirming osteomyelitis of right foot wound. Hand Signature: Date(s): Electronic Signature(s) Signed: 12/27/2022 5:03:25 PM By: Kalman Shan DO Entered By: Kalman Shan on 12/27/2022  11:47:43 -------------------------------------------------------------------------------- Problem List Details Patient Name: Date of Service: [facility] [place], [name] RY C. 12/27/2022 8:45 A M Medical Record Number: [id number] Patient Account Number: [id number] Date of Birth/Sex: Treating RN: Sep 18, 1923 (89 y.o. F) Primary Care Provider: [name] Other Clinician: Referring Provider: Treating Provider/Extender: Barbie Banner in Treatment: 0 Active Problems ICD-10 Encounter Code Description Active Date MDM Diagnosis L89.613 Pressure ulcer of right heel, stage 3 12/21/2022 No Yes L89.623 Pressure ulcer of left heel, stage 3 12/21/2022 No Yes L97.822 Non-pressure chronic ulcer of other part of left lower leg with fat layer exposed3/13/2024 No Yes I87.312 Chronic  venous hypertension (idiopathic) with ulcer of left lower extremity 12/21/2022 No Yes BESSY, SNODDY ([id number]) [phone] Page 4 of 8 [phone] Pressure ulcer of other site, stage 4 12/21/2022 No Yes S91.301A Unspecified open wound, right foot, initial encounter 12/21/2022 No Yes M86.671 Other chronic osteomyelitis, right ankle and foot 12/27/2022 No Yes Inactive Problems Resolved Problems Electronic Signature(s) Signed: 01/02/2023 12:03:15 PM By: Kalman Shan DO Previous Signature: 12/27/2022 5:03:25 PM Version By: Kalman Shan DO Entered By: Kalman Shan on 01/02/2023 09:21:40 -------------------------------------------------------------------------------- Progress Note Details Patient Name: Date of Service: Paula Sorrow, MA RY C. 12/27/2022 8:45 A M Medical Record Number: [id number] Patient Account Number: [id number] Date of Birth/Sex: Treating RN: 11-08-1922 (89 y.o. F) Primary Care Provider: [name] Other Clinician: Referring Provider: Treating Provider/Extender: Barbie Banner in Treatment: 0 Subjective Chief Complaint Information obtained from  Patient 12/21/2022; bilateral heel wounds, right medial foot wound and left posterior leg wound History of Present Illness (HPI) 12/21/2022 Ms. Srishti Labrake is a 89 year old female with a past medical history of CVA, CAD, and chronic diastolic heart failure that presents to the clinic for a 1 month history of nonhealing ulcers to her feet and a 1 week history of wound to her left leg. She states that she was in the hospital for heart failure in January and developed pressure ulcers to her feet and heels. She has been following with podiatry for this issue. They have been using Aquacel Ag to the wound beds. She has also been having her legs wrapped in Unna boots (by her PCP) to help with swelling however she does not have wounds to her legs that are being treated with the wrap. The Unna boot wrap started cutting into the back of her left leg and now she has an area open. She currently denies signs of infection. She has been using Prevalon boots to her feet bilaterally. 3/19; patient presents for follow-up. She had a bone biopsy done at last clinic visit that was positive for osteomyelitis. She had a culture done as well that showed Staphylococcus lugdunensis and Stenotrophomonas maltophilia. Patient been using Hydrofera Blue to the wound beds. Overall she denies any signs of infection. She is being referred to infectious disease. Patient History Information obtained from Patient, Chart. Family History Heart Disease - Mother, Stroke - Mother. Social History Never smoker, Marital Status - Divorced, Alcohol Use - Never, Drug Use - No History, Caffeine Use - Rarely. Medical History Eyes Patient has history of Cataracts - bil extractions, Glaucoma Hematologic/Lymphatic Patient has history of Anemia Cardiovascular Patient has history of Congestive Heart Failure, Coronary Artery Disease, Hypertension Genitourinary Denies history of End Stage Renal Disease Integumentary (Skin) Denies history of  History of Burn Musculoskeletal Patient has history of Gout, Osteoarthritis Denies history of Rheumatoid Arthritis, Osteomyelitis Neurologic Denies history of Neuropathy Oncologic Denies history of Received Chemotherapy, Received Radiation MEGHANN, AVERBECK ([id number]) [phone].pdf Page 5 of 8 Psychiatric Patient has history of Confinement Anxiety Denies history of Anorexia/bulimia Hospitalization/Surgery History - 11/16 NSTEMI. - 11/16 heart caths. - breast surgery. - hysterectomy. - extraction cataract. - left knee replacement. Medical A Surgical History Notes nd Constitutional Symptoms (General Health) CVA TIA Eyes hypertension retinopathy macular degeneration Ear/Nose/Mouth/Throat hard of hearing Gastrointestinal GERD Genitourinary stage III CKD UTIs, incontinence Neurologic CVA Objective Constitutional respirations regular, non-labored and within target range for patient.. Vitals Time Taken: 8:49 AM, Height: 60 in, Weight: 130 lbs, BMI: 25.4, Temperature: 97.5 F, Pulse: 67 bpm, Respiratory Rate: 18 breaths/min, Blood Pressure:  166/81 mmHg. Cardiovascular 2+ dorsalis pedis/posterior tibialis pulses. Psychiatric pleasant and cooperative. General Notes: Right heel wound with granulation tissue at the opening. T the left heel epithelialization to the previous wound. T the right medial foot there is a o o large open wound with bone easily visible. T the left posterior leg there is an area of skin breakdown. No signs of acute soft tissue infection to any of the o wound beds. Integumentary (Hair, Skin) Wound #1 status is Open. Original cause of wound was Gradually Appeared. The date acquired was: 12/07/2022. The wound is located on the Right,Medial Metatarsal head first. The wound measures 1cm length x 1.5cm width x 0.3cm depth; 1.178cm^2 area and 0.353cm^3 volume. There is bone and Fat Layer (Subcutaneous Tissue) exposed. There is a small amount of  serosanguineous drainage noted. The wound margin is distinct with the outline attached to the wound base. There is no granulation within the wound bed. There is a medium (34-66%) amount of necrotic tissue within the wound bed including Adherent Slough. The periwound skin appearance exhibited: Callus, Hemosiderin Staining. The periwound skin appearance did not exhibit: Dry/Scaly, Maceration. Periwound temperature was noted as Cool/Cold. The periwound has tenderness on palpation. Wound #2 status is Open. Original cause of wound was Pressure Injury. The date acquired was: 10/24/2022. The wound is located on the Right Calcaneus. The wound measures 0.5cm length x 0.5cm width x 0.1cm depth; 0.196cm^2 area and 0.02cm^3 volume. There is Fat Layer (Subcutaneous Tissue) exposed. There is a medium amount of serous drainage noted. The wound margin is distinct with the outline attached to the wound base. There is no granulation within the wound bed. There is a large (67-100%) amount of necrotic tissue within the wound bed including Adherent Slough. The periwound skin appearance had no abnormalities noted for texture. The periwound skin appearance had no abnormalities noted for color. The periwound skin appearance exhibited: Dry/Scaly. The periwound skin appearance did not exhibit: Maceration. Periwound temperature was noted as No Abnormality. The periwound has tenderness on palpation. Wound #3 status is Open. Original cause of wound was Pressure Injury. The date acquired was: 12/21/2022. The wound is located on the Left,Posterior Lower Leg. The wound measures 0.5cm length x 1.9cm width x 0.1cm depth; 0.746cm^2 area and 0.075cm^3 volume. There is Fat Layer (Subcutaneous Tissue) exposed. There is a medium amount of serosanguineous drainage noted. The wound margin is flat and intact. There is medium (34-66%) red granulation within the wound bed. There is a medium (34-66%) amount of necrotic tissue within the wound bed  including Adherent Slough. The periwound skin appearance had no abnormalities noted for texture. The periwound skin appearance had no abnormalities noted for moisture. The periwound skin appearance exhibited: Erythema. The surrounding wound skin color is noted with erythema. Periwound temperature was noted as No Abnormality. The periwound has tenderness on palpation. Wound #4 status is Healed - Epithelialized. Original cause of wound was Pressure Injury. The date acquired was: 10/24/2022. The wound is located on the Left Calcaneus. The wound measures 0cm length x 0cm width x 0cm depth; 0cm^2 area and 0cm^3 volume. There is Fat Layer (Subcutaneous Tissue) exposed. There is a medium amount of serosanguineous drainage noted. The wound margin is distinct with the outline attached to the wound base. There is large (67- 100%) pink, pale granulation within the wound bed. There is a small (1-33%) amount of necrotic tissue within the wound bed. The periwound skin appearance had no abnormalities noted for color. The periwound skin appearance exhibited: Callus,  Dry/Scaly. Periwound temperature was noted as No Abnormality. The periwound has tenderness on palpation. Assessment Active Problems ICD-10 Pressure ulcer of right heel, stage 3 Pressure ulcer of left heel, stage 3 Non-pressure chronic ulcer of other part of left lower leg with fat layer exposed Chronic venous hypertension (idiopathic) with ulcer of left lower extremity LINAH, SEGARS ([id number]) [id number] Page 6 of 8 Pressure ulcer of other site, stage 4 Unspecified open wound, right foot, initial encounter Other chronic osteomyelitis, right ankle and foot Patient's left heel wound has closed. The right heel and the left leg wound appear well-healing. I recommended continuing Hydrofera Blue here. The right medial foot wound still has exposed bone. Biopsy was positive for osteomyelitis we will refer her to infectious  disease. No signs of acute infection. I did recommend she go to the ED if she were to develop increased warmth, erythema or purulent drainage. Can continue Hydrofera Blue here as well. Plan Follow-up Appointments: Return appointment in 3 weeks. - w/ Dr. Heber [place] and [facility] rm # 7 Tuesday 01/17/23 @ 9:45 Anesthetic: (In clinic) Topical Lidocaine 4% applied to wound bed Bathing/ Shower/ Hygiene: May shower and wash wound with soap and water. Edema Control - Lymphedema / SCD / Other: Compression stocking or Garment 20-30 mm/Hg pressure to: - compression stockings both legs daily or tubigrip Off-Loading: Other: - heel protectors both heels especially while in bed Home Health: New wound care orders this week; continue Home Health for wound care. May utilize formulary equivalent dressing for wound treatment orders unless otherwise specified. Other Home Health Orders/Instructions: - [facility] Consults ordered were: Infectious Disease - Urgent Referral for positive bone biopsy confirming osteomyelitis of right foot wound. WOUND #1: - Metatarsal head first Wound Laterality: Right, Medial Prim Dressing: Hydrofera Blue Ready Transfer Foam, 2.5x2.5 (in/in) 1 x Per Day/30 Days ary Discharge Instructions: Apply directly to wound bed as directed Secondary Dressing: Woven Gauze Sponge, Non-Sterile 4x4 in 1 x Per Day/30 Days Discharge Instructions: Apply over primary dressing as directed. Secured With: [employer], 4.5x3.1 (in/yd) 1 x Per Day/30 Days Discharge Instructions: Secure with Kerlix as directed. Com pression Wrap: tubigrip 1 x Per Day/30 Days Discharge Instructions: apply in morning and may remove at night WOUND #2: - Calcaneus Wound Laterality: Right Prim Dressing: Hydrofera Blue Ready Transfer Foam, 2.5x2.5 (in/in) 3 x Per Week/30 Days ary Discharge Instructions: Apply directly to wound bed as directed Secondary Dressing: Zetuvit Plus Silicone Border Dressing 4x4 (in/in) 3 x Per  Week/30 Days Discharge Instructions: Apply silicone border over primary dressing as directed. WOUND #3: - Lower Leg Wound Laterality: Left, Posterior Prim Dressing: Hydrofera Blue Ready Transfer Foam, 2.5x2.5 (in/in) 3 x Per Week/30 Days ary Discharge Instructions: Apply directly to wound bed as directed Secondary Dressing: Zetuvit Plus Silicone Border Dressing 4x4 (in/in) 3 x Per Week/30 Days Discharge Instructions: Apply silicone border over primary dressing as directed. Secondary Dressing: tubigrip or stocking 3 x Per Week/30 Days 1. Hydrofera Blue 2. Referral to infectious disease 3. Follow-up in 3 weeks Electronic Signature(s) Signed: 01/02/2023 3:19:45 PM By: Kalman Shan DO Signed: 01/02/2023 4:40:03 PM By: Deon Pilling RN, BSN Previous Signature: 12/27/2022 5:03:25 PM Version By: Kalman Shan DO Entered By: Deon Pilling on 01/02/2023 14:55:16 -------------------------------------------------------------------------------- HxROS Details Patient Name: Date of Service: Paula Sorrow, MA RY C. 12/27/2022 8:45 A M Medical Record Number: [id number] Patient Account Number: [id number] Date of Birth/Sex: Treating RN: 03/21/1923 (89 y.o. F) Primary Care Provider: [name] Other Clinician: Referring Provider: Treating Provider/Extender:  Craig Staggers Weeks in Treatment: 0 Information Obtained From Patient Chart Constitutional Symptoms ([place]) Medical History: Past Medical History NotesMarland Kitchen AMRA, NOCERINO ([id number]) [id number] Page 7 of 8 CVA TIA Eyes Medical History: Positive for: Cataracts - bil extractions; Glaucoma Past Medical History Notes: hypertension retinopathy macular degeneration Ear/Nose/Mouth/Throat Medical History: Past Medical History Notes: hard of hearing Hematologic/Lymphatic Medical History: Positive for: Anemia Cardiovascular Medical History: Positive for: Congestive Heart Failure; Coronary  Artery Disease; Hypertension Gastrointestinal Medical History: Past Medical History Notes: GERD Genitourinary Medical History: Negative for: End Stage Renal Disease Past Medical History Notes: stage III CKD UTIs, incontinence Integumentary (Skin) Medical History: Negative for: History of Burn Musculoskeletal Medical History: Positive for: Gout; Osteoarthritis Negative for: Rheumatoid Arthritis; Osteomyelitis Neurologic Medical History: Negative for: Neuropathy Past Medical History Notes: CVA Oncologic Medical History: Negative for: Received Chemotherapy; Received Radiation Psychiatric Medical History: Positive for: Confinement Anxiety Negative for: Anorexia/bulimia HBO Extended History Items Eyes: Eyes: Cataracts Glaucoma Immunizations Pneumococcal Vaccine: Received Pneumococcal Vaccination: Yes Received Pneumococcal Vaccination On or After [address]CHANNELL, STRICKFADEN ([id number]) [id number] Page 8 of 8 Implantable Devices No devices added Hospitalization / Surgery History Type of Hospitalization/Surgery 11/16 NSTEMI 11/16 heart caths breast surgery hysterectomy extraction cataract left knee replacement Family and Social History Heart Disease: Yes - Mother; Stroke: Yes - Mother; Never smoker; Marital Status - Divorced; Alcohol Use: Never; Drug Use: No History; Caffeine Use: Rarely; Financial Concerns: No; Food, Clothing or Shelter Needs: No; Support System Lacking: No; Transportation Concerns: No Electronic Signature(s) Signed: 12/27/2022 5:03:25 PM By: Kalman Shan DO Entered By: Kalman Shan on 12/27/2022 11:46:20 -------------------------------------------------------------------------------- SuperBill Details Patient Name: Date of Service: [facility], [name] RY C. 12/27/2022 Medical Record Number: [id number] Patient Account Number: [id number] Date of Birth/Sex: Treating RN: 02/03/1923 (89 y.o. [name], Lauren Primary  Care Provider: [name] Other Clinician: Referring Provider: Treating Provider/Extender: Barbie Banner in Treatment: 0 Diagnosis Coding ICD-10 Codes Code Description [phone] Pressure ulcer of right heel, stage 3 L89.623 Pressure ulcer of left heel, stage 3 L97.822 Non-pressure chronic ulcer of other part of left lower leg with fat layer exposed I87.312 Chronic venous hypertension (idiopathic) with ulcer of left lower extremity L89.894 Pressure ulcer of other site, stage 4 S91.301A Unspecified open wound, right foot, initial encounter Facility Procedures : CPT4 Code: [id number] Description: [place] VISIT-LEV 5 EST PT Modifier: Quantity: 1 Physician Procedures : CPT4 Code Description Modifier [id number] - WC PHYS LEVEL 3 - EST PT ICD-10 Diagnosis Description L89.613 Pressure ulcer of right heel, stage 3 L89.623 Pressure ulcer of left heel, stage 3 L97.822 Non-pressure chronic ulcer of other part of left  lower leg with fat layer exposed L89.894 Pressure ulcer of other site, stage 4 Quantity: 1 Electronic Signature(s) Signed: 12/27/2022 5:03:25 PM By: Kalman Shan DO Entered By: Kalman Shan on 12/27/2022 11:50:19

## 2022-12-30 DIAGNOSIS — M109 Gout, unspecified: Secondary | ICD-10-CM | POA: Diagnosis not present

## 2022-12-30 DIAGNOSIS — I2510 Atherosclerotic heart disease of native coronary artery without angina pectoris: Secondary | ICD-10-CM | POA: Diagnosis not present

## 2022-12-30 DIAGNOSIS — G8929 Other chronic pain: Secondary | ICD-10-CM | POA: Diagnosis not present

## 2022-12-30 DIAGNOSIS — H409 Unspecified glaucoma: Secondary | ICD-10-CM | POA: Diagnosis not present

## 2022-12-30 DIAGNOSIS — I083 Combined rheumatic disorders of mitral, aortic and tricuspid valves: Secondary | ICD-10-CM | POA: Diagnosis not present

## 2022-12-30 DIAGNOSIS — I5033 Acute on chronic diastolic (congestive) heart failure: Secondary | ICD-10-CM | POA: Diagnosis not present

## 2022-12-30 DIAGNOSIS — E538 Deficiency of other specified B group vitamins: Secondary | ICD-10-CM | POA: Diagnosis not present

## 2022-12-30 DIAGNOSIS — I872 Venous insufficiency (chronic) (peripheral): Secondary | ICD-10-CM | POA: Diagnosis not present

## 2022-12-30 DIAGNOSIS — M25511 Pain in right shoulder: Secondary | ICD-10-CM | POA: Diagnosis not present

## 2022-12-30 DIAGNOSIS — E43 Unspecified severe protein-calorie malnutrition: Secondary | ICD-10-CM | POA: Diagnosis not present

## 2022-12-30 DIAGNOSIS — M1711 Unilateral primary osteoarthritis, right knee: Secondary | ICD-10-CM | POA: Diagnosis not present

## 2022-12-30 DIAGNOSIS — L89612 Pressure ulcer of right heel, stage 2: Secondary | ICD-10-CM | POA: Diagnosis not present

## 2022-12-30 DIAGNOSIS — I69354 Hemiplegia and hemiparesis following cerebral infarction affecting left non-dominant side: Secondary | ICD-10-CM | POA: Diagnosis not present

## 2022-12-30 DIAGNOSIS — N1832 Chronic kidney disease, stage 3b: Secondary | ICD-10-CM | POA: Diagnosis not present

## 2022-12-30 DIAGNOSIS — H35039 Hypertensive retinopathy, unspecified eye: Secondary | ICD-10-CM | POA: Diagnosis not present

## 2022-12-30 DIAGNOSIS — D509 Iron deficiency anemia, unspecified: Secondary | ICD-10-CM | POA: Diagnosis not present

## 2022-12-30 DIAGNOSIS — H547 Unspecified visual loss: Secondary | ICD-10-CM | POA: Diagnosis not present

## 2022-12-30 DIAGNOSIS — L97412 Non-pressure chronic ulcer of right heel and midfoot with fat layer exposed: Secondary | ICD-10-CM | POA: Diagnosis not present

## 2022-12-30 DIAGNOSIS — L89622 Pressure ulcer of left heel, stage 2: Secondary | ICD-10-CM | POA: Diagnosis not present

## 2022-12-30 DIAGNOSIS — J9601 Acute respiratory failure with hypoxia: Secondary | ICD-10-CM | POA: Diagnosis not present

## 2022-12-30 DIAGNOSIS — I130 Hypertensive heart and chronic kidney disease with heart failure and stage 1 through stage 4 chronic kidney disease, or unspecified chronic kidney disease: Secondary | ICD-10-CM | POA: Diagnosis not present

## 2022-12-30 DIAGNOSIS — E785 Hyperlipidemia, unspecified: Secondary | ICD-10-CM | POA: Diagnosis not present

## 2022-12-30 DIAGNOSIS — F32A Depression, unspecified: Secondary | ICD-10-CM | POA: Diagnosis not present

## 2022-12-30 DIAGNOSIS — H3530 Unspecified macular degeneration: Secondary | ICD-10-CM | POA: Diagnosis not present

## 2022-12-30 DIAGNOSIS — I270 Primary pulmonary hypertension: Secondary | ICD-10-CM | POA: Diagnosis not present

## 2022-12-31 NOTE — Progress Notes (Signed)
[name], [name] ([id number]) [id number] Page 1 of 14 Visit Report for 12/27/2022 Arrival Information Details Patient Name: Date of Service: [facility] [place], [place] 12/27/2022 8:45 A M Medical Record Number: [id number] Patient Account Number: [id number] Date of Birth/Sex: Treating RN: 1923/02/22 (89 y.o. F) Primary Care Ronal Maybury: [name] Other Clinician: Referring Yisroel Mullendore: Treating Ladell Lea/Extender: Barbie Banner in Treatment: 0 Visit Information History Since Last Visit All ordered tests and consults were completed: No Patient Arrived: Wheel Chair Added or deleted any medications: No Arrival Time: 08:48 Any new allergies or adverse reactions: No Accompanied By: daughter Had a fall or experienced change in No Transfer Assistance: Manual activities of daily living that may affect Patient Identification Verified: Yes risk of falls: Secondary Verification Process Completed: Yes Signs or symptoms of abuse/neglect since last visito No Patient Requires Transmission-Based Precautions: No Hospitalized since last visit: No Patient Has Alerts: Yes Implantable device outside of the clinic excluding No Patient Alerts: R and L ABI N/[name] cellular tissue based products placed in the center since last visit: Pain Present Now: No Electronic Signature(s) Signed: 12/28/2022 9:04:37 AM By: Worthy Rancher Entered By: Worthy Rancher on 12/27/2022 08:49:15 -------------------------------------------------------------------------------- Clinic Level of Care Assessment Details Patient Name: Date of Service: [facility] [place], [place] 12/27/2022 8:45 A M Medical Record Number: [id number] Patient Account Number: [id number] Date of Birth/Sex: Treating RN: Sep 20, 1923 (89 y.o. Tonita Phoenix, Lauren Primary Care Bren Borys: [name] Other Clinician: Referring Wasim Hurlbut: Treating Malva Diesing/Extender: Barbie Banner in Treatment: 0 Clinic Level of Care  Assessment Items TOOL 4 Quantity Score X- 1 0 Use when only an EandM is performed on FOLLOW-UP visit ASSESSMENTS - Nursing Assessment / Reassessment X- 1 10 Reassessment of Co-morbidities (includes updates in patient status) X- 1 5 Reassessment of Adherence to Treatment Plan ASSESSMENTS - Wound and Skin A ssessment / Reassessment []  - 0 Simple Wound Assessment / Reassessment - one wound X- 4 5 Complex Wound Assessment / Reassessment - multiple wounds []  - 0 Dermatologic / Skin Assessment (not related to wound area) ASSESSMENTS - Focused Assessment X- 2 5 Circumferential Edema Measurements - multi extremities []  - 0 Nutritional Assessment / Counseling / Intervention []  - 0 Lower Extremity Assessment (monofilament, tuning fork, pulses) []  - 0 Peripheral Arterial Disease Assessment (using hand held doppler) ASSESSMENTS - Ostomy and/or Continence Assessment and Care []  - 0 Incontinence Assessment and Management []  - 0 Ostomy Care Assessment and Management (repouching, etc.) PROCESS - Coordination of Care []  - 0 Simple Patient / Family Education for ongoing care [name], [name] ([id number]) [phone].pdf Page 2 of 14 X- 1 20 Complex (extensive) Patient / Family Education for ongoing care X- 1 10 Staff obtains Consents, Records, T Results / Process Orders est X- 1 10 Staff telephones HHA, Nursing Homes / Clarify orders / etc []  - 0 Routine Transfer to another Facility (non-emergent condition) []  - 0 Routine Hospital Admission (non-emergent condition) []  - 0 New Admissions / Biomedical engineer / Ordering NPWT Apligraf, etc. , []  - 0 Emergency Hospital Admission (emergent condition) []  - 0 Simple Discharge Coordination X- 1 15 Complex (extensive) Discharge Coordination PROCESS - Special Needs []  - 0 Pediatric / Minor Patient Management []  - 0 Isolation Patient Management []  - 0 Hearing / Language / Visual special needs []  -  0 Assessment of Community assistance (transportation, D/[name] planning, etc.) []  - 0 Additional assistance / Altered mentation []  - 0 Support Surface(s) Assessment (bed, cushion, seat, etc.) INTERVENTIONS -  Wound Cleansing / Measurement []  - 0 Simple Wound Cleansing - one wound X- 4 5 Complex Wound Cleansing - multiple wounds X- 1 5 Wound Imaging (photographs - any number of wounds) []  - 0 Wound Tracing (instead of photographs) []  - 0 Simple Wound Measurement - one wound X- 4 5 Complex Wound Measurement - multiple wounds INTERVENTIONS - Wound Dressings []  - 0 Small Wound Dressing one or multiple wounds X- 4 15 Medium Wound Dressing one or multiple wounds []  - 0 Large Wound Dressing one or multiple wounds X- 1 5 Application of Medications - topical []  - 0 Application of Medications - injection INTERVENTIONS - Miscellaneous []  - 0 External ear exam []  - 0 Specimen Collection (cultures, biopsies, blood, body fluids, etc.) []  - 0 Specimen(s) / Culture(s) sent or taken to Lab for analysis []  - 0 Patient Transfer (multiple staff / Civil Service fast streamer / Similar devices) []  - 0 Simple Staple / Suture removal (25 or less) []  - 0 Complex Staple / Suture removal (26 or more) []  - 0 Hypo / Hyperglycemic Management (close monitor of Blood Glucose) []  - 0 Ankle / Brachial Index (ABI) - do not check if billed separately X- 1 5 Vital Signs Has the patient been seen at the hospital within the last three years: Yes Total Score: 215 Level Of Care: New/Established - Level 5 Electronic Signature(s) Signed: 12/30/2022 11:12:19 AM By: Rhae Hammock RN Entered By: Rhae Hammock on 12/27/2022 10:11:06 [name] ([id number]) [id number] Page 3 of 14 -------------------------------------------------------------------------------- Encounter Discharge Information Details Patient Name: Date of Service: [facility] [place], [place] 12/27/2022 8:45 A M Medical Record Number:  [id number] Patient Account Number: [id number] Date of Birth/Sex: Treating RN: Aug 02, 1923 (89 y.o. Tonita Phoenix, Lauren Primary Care Westley Blass: [name] Other Clinician: Referring Freddye Cardamone: Treating Trumaine Wimer/Extender: Barbie Banner in Treatment: 0 Encounter Discharge Information Items Discharge Condition: Stable Ambulatory Status: Wheelchair Discharge Destination: Home Transportation: Private Auto Accompanied By: granddaughter Schedule Follow-up Appointment: Yes Clinical Summary of Care: Patient Declined Electronic Signature(s) Signed: 12/30/2022 11:12:19 AM By: Rhae Hammock RN Entered By: Rhae Hammock on 12/27/2022 10:11:43 -------------------------------------------------------------------------------- Lower Extremity Assessment Details Patient Name: Date of Service: [facility] [place], [name] 12/27/2022 8:45 A M Medical Record Number: [id number] Patient Account Number: [id number] Date of Birth/Sex: Treating RN: 1923/02/16 (89 y.o. Tonita Phoenix, Lauren Primary Care Jackie Russman: [name] Other Clinician: Referring Reighlynn Swiney: Treating Kyasia Steuck/Extender: Craig Staggers Weeks in Treatment: 0 Edema Assessment Assessed: Shirlyn Goltz: Yes] [Right: Yes] Edema: [Left: Yes] [Right: Yes] Calf Left: Right: Point of Measurement: From Medial Instep 24 cm 25.3 cm Ankle Left: Right: Point of Measurement: From Medial Instep 20.5 cm 21.3 cm Vascular Assessment Pulses: Dorsalis Pedis Palpable: [Left:Yes] [Right:Yes] Posterior Tibial Palpable: [Left:Yes] [Right:Yes] Electronic Signature(s) Signed: 12/30/2022 11:12:19 AM By: Rhae Hammock RN Entered By: Rhae Hammock on 12/27/2022 09:12:17 -------------------------------------------------------------------------------- Multi Wound Chart Details Patient Name: Date of Service: [facility], [name] 12/27/2022 8:45 A M Medical Record Number: [id number] Patient Account Number: [id number] Date of  Birth/Sex: Treating RN: [birth year] (89 y.o. F) Primary Care Addilyn Satterwhite: [name] Other Clinician: ZARAY, ARRUE ([id number]) [id number] Page 4 of 14 Referring Guadalupe Kerekes: Treating Elani Delph/Extender: Barbie Banner in Treatment: 0 Vital Signs Height(in): 60 Pulse(bpm): 64 Weight(lbs): 130 Blood Pressure(mmHg): 166/81 Body Mass Index(BMI): 25.4 Temperature(F): 97.5 Respiratory Rate(breaths/min): 18 [1:Photos:] Right, Medial Metatarsal head first Right Calcaneus Left, Posterior Lower Leg Wound Location: Gradually Appeared Pressure Injury Pressure Injury Wounding Event: Pressure Ulcer  Pressure Ulcer Venous Leg Ulcer Primary Etiology: Cataracts, Glaucoma, Anemia, Cataracts, Glaucoma, Anemia, Cataracts, Glaucoma, Anemia, Comorbid History: Congestive Heart Failure, Coronary Congestive Heart Failure, Coronary Congestive Heart Failure, Coronary Artery Disease, Hypertension, Gout, Artery Disease, Hypertension, Gout,Artery Disease, Hypertension, Gout, Osteoarthritis, Confinement Anxiety Osteoarthritis, Confinement AnxietyOsteoarthritis, Confinement Anxiety 12/07/2022 10/24/2022 12/21/2022 Date Acquired: 0 0 0 Weeks of Treatment: Open Open Open Wound Status: No No No Wound Recurrence: 1x1.5x0.3 0.5x0.5x0.1 0.5x1.9x0.1 Measurements L x W x D (cm) 1.178 0.196 0.746 A (cm) : rea 0.353 0.02 0.075 Volume (cm) : 11.80% 10.90% 65.60% % Reduction in Area: 12.00% 9.10% 65.40% % Reduction in Volume: Category/Stage IV Category/Stage III Full Thickness Without Exposed Classification: Support Structures Small Medium Medium Exudate Amount: Serosanguineous Serous Serosanguineous Exudate Type: red, brown amber red, brown Exudate Color: Distinct, outline attached Distinct, outline attached Flat and Intact Wound Margin: None Present (0%) None Present (0%) Medium (34-66%) Granulation Amount: N/A N/A Red Granulation Quality: Medium  (34-66%) Large (67-100%) Medium (34-66%) Necrotic Amount: Fat Layer (Subcutaneous Tissue): Yes Fat Layer (Subcutaneous Tissue): Yes Fat Layer (Subcutaneous Tissue): Yes Exposed Structures: Bone: Yes Fascia: No Fascia: No Tendon: No Tendon: No Muscle: No Muscle: No Joint: No Joint: No Bone: No Bone: No Medium (34-66%) Small (1-33%) Small (1-33%) Epithelialization: Callus: Yes No Abnormalities Noted No Abnormalities Noted Periwound Skin Texture: Maceration: No Dry/Scaly: Yes No Abnormalities Noted Periwound Skin Moisture: Dry/Scaly: No Maceration: No Hemosiderin Staining: Yes No Abnormalities Noted Erythema: Yes Periwound Skin Color: Cool/Cold No Abnormality No Abnormality Temperature: Yes Yes Yes Tenderness on Palpation: Wound Number: 4 N/A N/A Photos: N/A N/A Left Calcaneus N/A N/A Wound Location: Pressure Injury N/A N/A Wounding Event: Pressure Ulcer N/A N/A Primary Etiology: Cataracts, Glaucoma, Anemia, N/A N/A Comorbid History: Congestive Heart Failure, Coronary Artery Disease, Hypertension, Gout, Osteoarthritis, Confinement Anxiety 10/24/2022 N/A N/A Date Acquired: 0 N/A N/A Weeks of Treatment: Healed - Epithelialized N/A N/A Wound StatusJELENE, [name] ([id number]) [id number] Page 5 of 14 No N/A N/A Wound Recurrence: 0x0x0 N/A N/A Measurements L x W x D (cm) 0 N/A N/A A (cm) : rea 0 N/A N/A Volume (cm) : 100.00% N/A N/A % Reduction in A rea: 100.00% N/A N/A % Reduction in Volume: Category/Stage III N/A N/A Classification: Medium N/A N/A Exudate A mount: Serosanguineous N/A N/A Exudate Type: red, brown N/A N/A Exudate Color: Distinct, outline attached N/A N/A Wound Margin: Large (67-100%) N/A N/A Granulation A mount: Pink, Pale N/A N/A Granulation Quality: Small (1-33%) N/A N/A Necrotic A mount: Fat Layer (Subcutaneous Tissue): Yes N/A N/A Exposed Structures: Fascia: No Tendon: No Muscle: No Joint:  No Bone: No Small (1-33%) N/A N/A Epithelialization: Callus: Yes N/A N/A Periwound Skin Texture: Dry/Scaly: Yes N/A N/A Periwound Skin Moisture: No Abnormalities Noted N/A N/A Periwound Skin Color: No Abnormality N/A N/A Temperature: Yes N/A N/A Tenderness on Palpation: Treatment Notes Wound #1 (Metatarsal head first) Wound Laterality: Right, Medial Cleanser Peri-Wound Care Topical Primary Dressing Hydrofera Blue Ready Transfer Foam, 2.5x2.5 (in/in) Discharge Instruction: Apply directly to wound bed as directed Secondary Dressing Woven Gauze Sponge, Non-Sterile 4x4 in Discharge Instruction: Apply over primary dressing as directed. Secured With [employer], 4.5x3.1 (in/yd) Discharge Instruction: Secure with Kerlix as directed. Compression Wrap tubigrip Discharge Instruction: apply in morning and may remove at night Compression Stockings Add-Ons Wound #2 (Calcaneus) Wound Laterality: Right Cleanser Peri-Wound Care Topical Primary Dressing Hydrofera Blue Ready Transfer Foam, 2.5x2.5 (in/in) Discharge Instruction: Apply directly to wound bed as directed Secondary Dressing Zetuvit Plus Silicone Border Dressing 4x4 (  in/in) Discharge Instruction: Apply silicone border over primary dressing as directed. Secured With Compression Wrap Compression Stockings Add-Ons [name], [name] ([id number]) [id number].pdf Page 6 of 14 Wound #3 (Lower Leg) Wound Laterality: Left, Posterior Cleanser Peri-Wound Care Topical Primary Dressing Hydrofera Blue Ready Transfer Foam, 2.5x2.5 (in/in) Discharge Instruction: Apply directly to wound bed as directed Secondary Dressing Zetuvit Plus Silicone Border Dressing 4x4 (in/in) Discharge Instruction: Apply silicone border over primary dressing as directed. tubigrip or stocking Secured With Compression Wrap Compression Stockings Add-Ons Wound #4 (Calcaneus) Wound Laterality: Left Cleanser Peri-Wound  Care Topical Primary Dressing Secondary Dressing Secured With Compression Wrap Compression Stockings Add-Ons Electronic Signature(s) Signed: 12/27/2022 5:03:25 PM By: Kalman Shan DO Entered By: Kalman Shan on 12/27/2022 11:45:40 -------------------------------------------------------------------------------- Multi-Disciplinary Care Plan Details Patient Name: Date of Service: [facility] [place], [name] [name] 12/27/2022 8:45 A M Medical Record Number: [id number] Patient Account Number: [id number] Date of Birth/Sex: Treating RN: May 19, 1923 (89 y.o. Tonita Phoenix, Lauren Primary Care Vernel Donlan: [name] Other Clinician: Referring Benuel Ly: Treating Thecla Forgione/Extender: Barbie Banner in Treatment: [place] reviewed with physician Active Inactive Abuse / Safety / Falls / Self Care Management Nursing Diagnoses: History of Falls Goals: Patient/caregiver will verbalize/demonstrate measures taken to prevent injury and/or falls Date Initiated: 12/21/2022 Target Resolution Date: 01/18/2023 Goal Status: Active Interventions: Assess fall risk on admission and as needed LINDZEE, TIMPSON ([id number]) [phone].pdf Page 7 of 14 Assess impairment of mobility on admission and as needed per policy Assess personal safety and home safety (as indicated) on admission and as needed Notes: Pressure Nursing Diagnoses: Knowledge deficit related to causes and risk factors for pressure ulcer development Knowledge deficit related to management of pressures ulcers Potential for impaired tissue integrity related to pressure, friction, moisture, and shear Goals: Patient/caregiver will verbalize understanding of pressure ulcer management Date Initiated: 12/21/2022 Target Resolution Date: 01/18/2023 Goal Status: Active Interventions: Assess: immobility, friction, shearing, incontinence upon admission and as needed Assess offloading mechanisms upon  admission and as needed Assess potential for pressure ulcer upon admission and as needed Provide education on pressure ulcers Notes: Wound/Skin Impairment Nursing Diagnoses: Impaired tissue integrity Knowledge deficit related to ulceration/compromised skin integrity Goals: Patient/caregiver will verbalize understanding of skin care regimen Date Initiated: 12/21/2022 Target Resolution Date: 01/18/2023 Goal Status: Active Ulcer/skin breakdown will have a volume reduction of 30% by week 4 Date Initiated: 12/21/2022 Target Resolution Date: 01/18/2023 Goal Status: Active Interventions: Assess patient/caregiver ability to obtain necessary supplies Assess patient/caregiver ability to perform ulcer/skin care regimen upon admission and as needed Assess ulceration(s) every visit Provide education on ulcer and skin care Treatment Activities: Skin care regimen initiated : 12/21/2022 Topical wound management initiated : 12/21/2022 Notes: Electronic Signature(s) Signed: 12/30/2022 11:12:19 AM By: Rhae Hammock RN Entered By: Rhae Hammock on 12/27/2022 08:58:42 -------------------------------------------------------------------------------- Pain Assessment Details Patient Name: Date of Service: [name] 12/27/2022 8:45 A M Medical Record Number: [id number] Patient Account Number: [id number] Date of Birth/Sex: Treating RN: Feb 10, 1923 (89 y.o. F) Primary Care Santiago Graf: [name] Other Clinician: Referring Kyshawn Teal: Treating Lissie Hinesley/Extender: Barbie Banner in Treatment: 0 Active Problems Location of Pain Severity and Description of Pain Patient Has Paino No Site Locations [place], [place] [name] ([id number]) [id number] Page 8 of 14 Pain Management and Medication Current Pain Management: Electronic Signature(s) Signed: 12/28/2022 9:04:37 AM By: Worthy Rancher Entered By: Worthy Rancher on 12/27/2022  08:49:47 -------------------------------------------------------------------------------- Patient/Caregiver Education Details Patient Name: Date of Service: [facility], [name] 3/19/2024andnbsp8:45 A M  Medical Record Number: [id number] Patient Account Number: [id number] Date of Birth/Gender: Treating RN: 07-29-1923 (89 y.o. Tonita Phoenix, Lauren Primary Care Physician: [name] Other Clinician: Referring Physician: Treating Physician/Extender: Barbie Banner in Treatment: 0 Education Assessment Education Provided To: Patient Education Topics Provided Wound/Skin Impairment: Methods: Explain/Verbal Responses: Reinforcements needed, State content correctly Electronic Signature(s) Signed: 12/30/2022 11:12:19 AM By: Rhae Hammock RN Entered By: Rhae Hammock on 12/27/2022 08:59:27 -------------------------------------------------------------------------------- Wound Assessment Details Patient Name: Date of Service: [facility], [name] 12/27/2022 8:45 A M Medical Record Number: [id number] Patient Account Number: [id number] Date of Birth/Sex: Treating RN: 19-Aug-1923 (89 y.o. F) Primary Care Domonic Hiscox: [name] Other Clinician: Referring Nicolet Griffy: Treating Zainah Steven/Extender: Craig Staggers Weeks in Treatment: 0 Wound Status Wound Number: 1 Primary Pressure Ulcer Etiology: Wound Location: Right, Medial Metatarsal head first Wound Open Wounding Event: Gradually Appeared Status: Date Acquired: 12/07/2022 Comorbid Cataracts, Glaucoma, Anemia, Congestive Heart Failure, Coronary Weeks Of Treatment: 0 History: Artery Disease, Hypertension, Gout, Osteoarthritis, Confinement Clustered Wound: No Anxiety [name], [name] ([id number]) [phone].pdf Page 9 of 14 Photos Wound Measurements Length: (cm) 1 Width: (cm) 1.5 Depth: (cm) 0.3 Area: (cm) 1.178 Volume: (cm) 0.353 % Reduction in Area: 11.8% % Reduction in  Volume: 12% Epithelialization: Medium (34-66%) Wound Description Classification: Category/Stage IV Wound Margin: Distinct, outline attached Exudate Amount: Small Exudate Type: Serosanguineous Exudate Color: red, brown Foul Odor After Cleansing: No Slough/Fibrino Yes Wound Bed Granulation Amount: None Present (0%) Exposed Structure Necrotic Amount: Medium (34-66%) Fat Layer (Subcutaneous Tissue) Exposed: Yes Necrotic Quality: Adherent Slough Bone Exposed: Yes Periwound Skin Texture Texture Color No Abnormalities Noted: No No Abnormalities Noted: No Callus: Yes Hemosiderin Staining: Yes Moisture Temperature / Pain No Abnormalities Noted: No Temperature: Cool/Cold Dry / Scaly: No Tenderness on Palpation: Yes Maceration: No Treatment Notes Wound #1 (Metatarsal head first) Wound Laterality: Right, Medial Cleanser Peri-Wound Care Topical Primary Dressing Hydrofera Blue Ready Transfer Foam, 2.5x2.5 (in/in) Discharge Instruction: Apply directly to wound bed as directed Secondary Dressing Woven Gauze Sponge, Non-Sterile 4x4 in Discharge Instruction: Apply over primary dressing as directed. Secured With [employer], 4.5x3.1 (in/yd) Discharge Instruction: Secure with Kerlix as directed. Compression Wrap tubigrip Discharge Instruction: apply in morning and may remove at night Compression Stockings Add-Ons Electronic Signature(s) [name], [name] ([id number]) [id number] Page 10 of 14 Signed: 12/28/2022 9:04:37 AM By: Worthy Rancher Entered By: Worthy Rancher on 12/27/2022 09:02:33 -------------------------------------------------------------------------------- Wound Assessment Details Patient Name: Date of Service: [facility] [place], [place] 12/27/2022 8:45 A M Medical Record Number: [id number] Patient Account Number: [id number] Date of Birth/Sex: Treating RN: 1923-09-04 (89 y.o. F) Primary Care Clara Smolen: [name] Other Clinician: Referring  Sojourner Behringer: Treating Shannon Balthazar/Extender: Barbie Banner in Treatment: 0 Wound Status Wound Number: 2 Primary Pressure Ulcer Etiology: Wound Location: Right Calcaneus Wound Open Wounding Event: Pressure Injury Status: Date Acquired: 10/24/2022 Comorbid Cataracts, Glaucoma, Anemia, Congestive Heart Failure, Coronary Weeks Of Treatment: 0 History: Artery Disease, Hypertension, Gout, Osteoarthritis, Confinement Clustered Wound: No Anxiety Photos Wound Measurements Length: (cm) 0.5 Width: (cm) 0.5 Depth: (cm) 0.1 Area: (cm) 0.196 Volume: (cm) 0.02 % Reduction in Area: 10.9% % Reduction in Volume: 9.1% Epithelialization: Small (1-33%) Wound Description Classification: Category/Stage III Wound Margin: Distinct, outline attached Exudate Amount: Medium Exudate Type: Serous Exudate Color: amber Foul Odor After Cleansing: No Slough/Fibrino Yes Wound Bed Granulation Amount: None Present (0%) Exposed Structure Necrotic Amount: Large (67-100%) Fascia Exposed: No Necrotic Quality: Adherent Slough Fat Layer (Subcutaneous Tissue) Exposed: Yes Tendon  Exposed: No Muscle Exposed: No Joint Exposed: No Bone Exposed: No Periwound Skin Texture Texture Color No Abnormalities Noted: Yes No Abnormalities Noted: Yes Moisture Temperature / Pain No Abnormalities Noted: No Temperature: No Abnormality Dry / Scaly: Yes Tenderness on Palpation: Yes Maceration: No Treatment Notes Wound #2 (Calcaneus) Wound Laterality: Right [name], [name] ([id number]) [phone].pdf Page 11 of 14 Cleanser Peri-Wound Care Topical Primary Dressing Hydrofera Blue Ready Transfer Foam, 2.5x2.5 (in/in) Discharge Instruction: Apply directly to wound bed as directed Secondary Dressing Zetuvit Plus Silicone Border Dressing 4x4 (in/in) Discharge Instruction: Apply silicone border over primary dressing as directed. Secured With Compression Wrap Compression  Stockings Environmental education officer) Signed: 12/28/2022 9:04:37 AM By: Worthy Rancher Entered By: Worthy Rancher on 12/27/2022 09:03:30 -------------------------------------------------------------------------------- Wound Assessment Details Patient Name: Date of Service: [facility] [place], [name] 12/27/2022 8:45 A M Medical Record Number: [id number] Patient Account Number: [id number] Date of Birth/Sex: Treating RN: [birth year] (89 y.o. F) Primary Care Mylani Gentry: [name] Other Clinician: Referring Aundra Pung: Treating Jazminn Pomales/Extender: Barbie Banner in Treatment: 0 Wound Status Wound Number: 3 Primary Venous Leg Ulcer Etiology: Wound Location: Left, Posterior Lower Leg Wound Open Wounding Event: Pressure Injury Status: Date Acquired: 12/21/2022 Comorbid Cataracts, Glaucoma, Anemia, Congestive Heart Failure, Coronary Weeks Of Treatment: 0 History: Artery Disease, Hypertension, Gout, Osteoarthritis, Confinement Clustered Wound: No Anxiety Photos Wound Measurements Length: (cm) 0.5 Width: (cm) 1.9 Depth: (cm) 0.1 Area: (cm) 0.746 Volume: (cm) 0.075 % Reduction in Area: 65.6% % Reduction in Volume: 65.4% Epithelialization: Small (1-33%) Wound Description Classification: Full Thickness Without Exposed Support Wound Margin: Flat and Intact Exudate Amount: Medium Exudate Type: Serosanguineous Exudate Color: red, brown Structures Foul Odor After Cleansing: No Slough/Fibrino Yes Wound Bed [name], [name] ([id number]) [phone].pdf Page 12 of 14 Granulation Amount: Medium (34-66%) Exposed Structure Granulation Quality: Red Fascia Exposed: No Necrotic Amount: Medium (34-66%) Fat Layer (Subcutaneous Tissue) Exposed: Yes Necrotic Quality: Adherent Slough Tendon Exposed: No Muscle Exposed: No Joint Exposed: No Bone Exposed: No Periwound Skin Texture Texture Color No Abnormalities Noted: Yes No Abnormalities Noted: No Erythema:  Yes Moisture No Abnormalities Noted: Yes Temperature / Pain Temperature: No Abnormality Tenderness on Palpation: Yes Treatment Notes Wound #3 (Lower Leg) Wound Laterality: Left, Posterior Cleanser Peri-Wound Care Topical Primary Dressing Hydrofera Blue Ready Transfer Foam, 2.5x2.5 (in/in) Discharge Instruction: Apply directly to wound bed as directed Secondary Dressing Zetuvit Plus Silicone Border Dressing 4x4 (in/in) Discharge Instruction: Apply silicone border over primary dressing as directed. tubigrip or stocking Secured With Compression Wrap Compression Stockings Environmental education officer) Signed: 12/28/2022 9:04:37 AM By: Worthy Rancher Entered By: Worthy Rancher on 12/27/2022 09:04:10 -------------------------------------------------------------------------------- Wound Assessment Details Patient Name: Date of Service: [facility] [place], [name] 12/27/2022 8:45 A M Medical Record Number: [id number] Patient Account Number: [id number] Date of Birth/Sex: Treating RN: [birth year] (89 y.o. Tonita Phoenix, Lauren Primary Care Braidyn Scorsone: [name] Other Clinician: Referring Daylani Deblois: Treating Annora Guderian/Extender: Craig Staggers Weeks in Treatment: 0 Wound Status Wound Number: 4 Primary Pressure Ulcer Etiology: Wound Location: Left Calcaneus Wound Healed - Epithelialized Wounding Event: Pressure Injury Status: Date Acquired: 10/24/2022 Comorbid Cataracts, Glaucoma, Anemia, Congestive Heart Failure, Coronary Weeks Of Treatment: 0 History: Artery Disease, Hypertension, Gout, Osteoarthritis, Confinement Clustered Wound: No Anxiety Photos [name], [name] ([id number]) [id number].pdf Page 13 of 14 Wound Measurements Length: (cm) Width: (cm) Depth: (cm) Area: (cm) Volume: (cm) 0 % Reduction in Area: 100% 0 % Reduction in Volume: 100% 0 Epithelialization: Small (1-33%) 0 0 Wound Description Classification: Category/Stage III  Wound Margin:  Distinct, outline attached Exudate Amount: Medium Exudate Type: Serosanguineous Exudate Color: red, brown Foul Odor After Cleansing: No Slough/Fibrino Yes Wound Bed Granulation Amount: Large (67-100%) Exposed Structure Granulation Quality: Pink, Pale Fascia Exposed: No Necrotic Amount: Small (1-33%) Fat Layer (Subcutaneous Tissue) Exposed: Yes Tendon Exposed: No Muscle Exposed: No Joint Exposed: No Bone Exposed: No Periwound Skin Texture Texture Color No Abnormalities Noted: No No Abnormalities Noted: Yes Callus: Yes Temperature / Pain Temperature: No Abnormality Moisture No Abnormalities Noted: No Tenderness on Palpation: Yes Dry / Scaly: Yes Treatment Notes Wound #4 (Calcaneus) Wound Laterality: Left Cleanser Peri-Wound Care Topical Primary Dressing Secondary Dressing Secured With Compression Wrap Compression Stockings Add-Ons Electronic Signature(s) Signed: 12/30/2022 11:12:19 AM By: Rhae Hammock RN Entered By: Rhae Hammock on 12/27/2022 10:01:45 -------------------------------------------------------------------------------- Vitals Details Patient Name: Date of Service: [facility] [name] 12/27/2022 8:45 A [name] ([id number] Page 14 of 14 Medical Record Number: [id number] Patient Account Number: [id number] Date of Birth/Sex: Treating RN: Oct 27, 1922 (89 y.o. F) Primary Care Anadia Helmes: [name] Other Clinician: Referring Tulsi Crossett: Treating Arwen Haseley/Extender: Barbie Banner in Treatment: 0 Vital Signs Time Taken: 08:49 Temperature (F): 97.5 Height (in): 60 Pulse (bpm): 67 Weight (lbs): 130 Respiratory Rate (breaths/min): 18 Body Mass Index (BMI): 25.4 Blood Pressure (mmHg): 166/81 Reference Range: 80 - 120 mg / dl Electronic Signature(s) Signed: 12/28/2022 9:04:37 AM By: Worthy Rancher Entered By: Worthy Rancher on 12/27/2022 08:49:40

## 2023-01-02 DIAGNOSIS — S91302D Unspecified open wound, left foot, subsequent encounter: Secondary | ICD-10-CM | POA: Diagnosis not present

## 2023-01-03 DIAGNOSIS — H409 Unspecified glaucoma: Secondary | ICD-10-CM | POA: Diagnosis not present

## 2023-01-03 DIAGNOSIS — N1832 Chronic kidney disease, stage 3b: Secondary | ICD-10-CM | POA: Diagnosis not present

## 2023-01-03 DIAGNOSIS — E43 Unspecified severe protein-calorie malnutrition: Secondary | ICD-10-CM | POA: Diagnosis not present

## 2023-01-03 DIAGNOSIS — I2510 Atherosclerotic heart disease of native coronary artery without angina pectoris: Secondary | ICD-10-CM | POA: Diagnosis not present

## 2023-01-03 DIAGNOSIS — H3530 Unspecified macular degeneration: Secondary | ICD-10-CM | POA: Diagnosis not present

## 2023-01-03 DIAGNOSIS — E538 Deficiency of other specified B group vitamins: Secondary | ICD-10-CM | POA: Diagnosis not present

## 2023-01-03 DIAGNOSIS — I5033 Acute on chronic diastolic (congestive) heart failure: Secondary | ICD-10-CM | POA: Diagnosis not present

## 2023-01-03 DIAGNOSIS — I270 Primary pulmonary hypertension: Secondary | ICD-10-CM | POA: Diagnosis not present

## 2023-01-03 DIAGNOSIS — I872 Venous insufficiency (chronic) (peripheral): Secondary | ICD-10-CM | POA: Diagnosis not present

## 2023-01-03 DIAGNOSIS — I69354 Hemiplegia and hemiparesis following cerebral infarction affecting left non-dominant side: Secondary | ICD-10-CM | POA: Diagnosis not present

## 2023-01-03 DIAGNOSIS — D509 Iron deficiency anemia, unspecified: Secondary | ICD-10-CM | POA: Diagnosis not present

## 2023-01-03 DIAGNOSIS — L97412 Non-pressure chronic ulcer of right heel and midfoot with fat layer exposed: Secondary | ICD-10-CM | POA: Diagnosis not present

## 2023-01-03 DIAGNOSIS — H35039 Hypertensive retinopathy, unspecified eye: Secondary | ICD-10-CM | POA: Diagnosis not present

## 2023-01-03 DIAGNOSIS — L89612 Pressure ulcer of right heel, stage 2: Secondary | ICD-10-CM | POA: Diagnosis not present

## 2023-01-03 DIAGNOSIS — I083 Combined rheumatic disorders of mitral, aortic and tricuspid valves: Secondary | ICD-10-CM | POA: Diagnosis not present

## 2023-01-03 DIAGNOSIS — H547 Unspecified visual loss: Secondary | ICD-10-CM | POA: Diagnosis not present

## 2023-01-03 DIAGNOSIS — M109 Gout, unspecified: Secondary | ICD-10-CM | POA: Diagnosis not present

## 2023-01-03 DIAGNOSIS — L89622 Pressure ulcer of left heel, stage 2: Secondary | ICD-10-CM | POA: Diagnosis not present

## 2023-01-03 DIAGNOSIS — I130 Hypertensive heart and chronic kidney disease with heart failure and stage 1 through stage 4 chronic kidney disease, or unspecified chronic kidney disease: Secondary | ICD-10-CM | POA: Diagnosis not present

## 2023-01-03 DIAGNOSIS — M1711 Unilateral primary osteoarthritis, right knee: Secondary | ICD-10-CM | POA: Diagnosis not present

## 2023-01-03 DIAGNOSIS — E785 Hyperlipidemia, unspecified: Secondary | ICD-10-CM | POA: Diagnosis not present

## 2023-01-03 DIAGNOSIS — J9601 Acute respiratory failure with hypoxia: Secondary | ICD-10-CM | POA: Diagnosis not present

## 2023-01-03 DIAGNOSIS — G8929 Other chronic pain: Secondary | ICD-10-CM | POA: Diagnosis not present

## 2023-01-03 DIAGNOSIS — M25511 Pain in right shoulder: Secondary | ICD-10-CM | POA: Diagnosis not present

## 2023-01-03 DIAGNOSIS — F32A Depression, unspecified: Secondary | ICD-10-CM | POA: Diagnosis not present

## 2023-01-04 DIAGNOSIS — I5033 Acute on chronic diastolic (congestive) heart failure: Secondary | ICD-10-CM | POA: Diagnosis not present

## 2023-01-04 DIAGNOSIS — L97412 Non-pressure chronic ulcer of right heel and midfoot with fat layer exposed: Secondary | ICD-10-CM | POA: Diagnosis not present

## 2023-01-04 DIAGNOSIS — I083 Combined rheumatic disorders of mitral, aortic and tricuspid valves: Secondary | ICD-10-CM | POA: Diagnosis not present

## 2023-01-04 DIAGNOSIS — M1711 Unilateral primary osteoarthritis, right knee: Secondary | ICD-10-CM | POA: Diagnosis not present

## 2023-01-04 DIAGNOSIS — H547 Unspecified visual loss: Secondary | ICD-10-CM | POA: Diagnosis not present

## 2023-01-04 DIAGNOSIS — G8929 Other chronic pain: Secondary | ICD-10-CM | POA: Diagnosis not present

## 2023-01-04 DIAGNOSIS — N1832 Chronic kidney disease, stage 3b: Secondary | ICD-10-CM | POA: Diagnosis not present

## 2023-01-04 DIAGNOSIS — E785 Hyperlipidemia, unspecified: Secondary | ICD-10-CM | POA: Diagnosis not present

## 2023-01-04 DIAGNOSIS — D509 Iron deficiency anemia, unspecified: Secondary | ICD-10-CM | POA: Diagnosis not present

## 2023-01-04 DIAGNOSIS — L89612 Pressure ulcer of right heel, stage 2: Secondary | ICD-10-CM | POA: Diagnosis not present

## 2023-01-04 DIAGNOSIS — J9601 Acute respiratory failure with hypoxia: Secondary | ICD-10-CM | POA: Diagnosis not present

## 2023-01-04 DIAGNOSIS — E538 Deficiency of other specified B group vitamins: Secondary | ICD-10-CM | POA: Diagnosis not present

## 2023-01-04 DIAGNOSIS — I2510 Atherosclerotic heart disease of native coronary artery without angina pectoris: Secondary | ICD-10-CM | POA: Diagnosis not present

## 2023-01-04 DIAGNOSIS — H409 Unspecified glaucoma: Secondary | ICD-10-CM | POA: Diagnosis not present

## 2023-01-04 DIAGNOSIS — L89622 Pressure ulcer of left heel, stage 2: Secondary | ICD-10-CM | POA: Diagnosis not present

## 2023-01-04 DIAGNOSIS — M109 Gout, unspecified: Secondary | ICD-10-CM | POA: Diagnosis not present

## 2023-01-04 DIAGNOSIS — H3530 Unspecified macular degeneration: Secondary | ICD-10-CM | POA: Diagnosis not present

## 2023-01-04 DIAGNOSIS — I130 Hypertensive heart and chronic kidney disease with heart failure and stage 1 through stage 4 chronic kidney disease, or unspecified chronic kidney disease: Secondary | ICD-10-CM | POA: Diagnosis not present

## 2023-01-04 DIAGNOSIS — E43 Unspecified severe protein-calorie malnutrition: Secondary | ICD-10-CM | POA: Diagnosis not present

## 2023-01-04 DIAGNOSIS — I872 Venous insufficiency (chronic) (peripheral): Secondary | ICD-10-CM | POA: Diagnosis not present

## 2023-01-04 DIAGNOSIS — I69354 Hemiplegia and hemiparesis following cerebral infarction affecting left non-dominant side: Secondary | ICD-10-CM | POA: Diagnosis not present

## 2023-01-04 DIAGNOSIS — I270 Primary pulmonary hypertension: Secondary | ICD-10-CM | POA: Diagnosis not present

## 2023-01-04 DIAGNOSIS — H35039 Hypertensive retinopathy, unspecified eye: Secondary | ICD-10-CM | POA: Diagnosis not present

## 2023-01-04 DIAGNOSIS — F32A Depression, unspecified: Secondary | ICD-10-CM | POA: Diagnosis not present

## 2023-01-04 DIAGNOSIS — M25511 Pain in right shoulder: Secondary | ICD-10-CM | POA: Diagnosis not present

## 2023-01-05 ENCOUNTER — Other Ambulatory Visit: Payer: Self-pay

## 2023-01-05 ENCOUNTER — Emergency Department (HOSPITAL_BASED_OUTPATIENT_CLINIC_OR_DEPARTMENT_OTHER)
Admission: EM | Admit: 2023-01-05 | Discharge: 2023-01-05 | Disposition: A | Discharge status: Home / Self Care | Payer: Medicare Other | Attending: Emergency Medicine | Admitting: Emergency Medicine

## 2023-01-05 ENCOUNTER — Encounter (HOSPITAL_BASED_OUTPATIENT_CLINIC_OR_DEPARTMENT_OTHER): Payer: Self-pay | Admitting: Emergency Medicine

## 2023-01-05 DIAGNOSIS — S81812A Laceration without foreign body, left lower leg, initial encounter: Secondary | ICD-10-CM

## 2023-01-05 DIAGNOSIS — W01198A Fall on same level from slipping, tripping and stumbling with subsequent striking against other object, initial encounter: Secondary | ICD-10-CM | POA: Insufficient documentation

## 2023-01-05 DIAGNOSIS — Z79899 Other long term (current) drug therapy: Secondary | ICD-10-CM | POA: Insufficient documentation

## 2023-01-05 DIAGNOSIS — S51011A Laceration without foreign body of right elbow, initial encounter: Secondary | ICD-10-CM | POA: Diagnosis not present

## 2023-01-05 DIAGNOSIS — I10 Essential (primary) hypertension: Secondary | ICD-10-CM | POA: Insufficient documentation

## 2023-01-05 DIAGNOSIS — Z7982 Long term (current) use of aspirin: Secondary | ICD-10-CM | POA: Insufficient documentation

## 2023-01-05 DIAGNOSIS — Y92009 Unspecified place in unspecified non-institutional (private) residence as the place of occurrence of the external cause: Secondary | ICD-10-CM | POA: Insufficient documentation

## 2023-01-05 DIAGNOSIS — S41112A Laceration without foreign body of left upper arm, initial encounter: Secondary | ICD-10-CM | POA: Diagnosis not present

## 2023-01-05 NOTE — ED Triage Notes (Signed)
Pt arrives to ED with family with c/o fall today. She has a laceration to right elbow and left leg.

## 2023-01-05 NOTE — Discharge Instructions (Signed)
Keep bandages on for at least 24 hours. After that, gently clean around area with soap and water and place new non-adhesive bandage over wound. Try to keep area dry. Seek emergency care if experiencing swelling, redness, purulence, fever, chills, or any other signs of infection.

## 2023-01-05 NOTE — ED Provider Notes (Signed)
[facility] Provider Note   CSN: [id number] Arrival date & time: 01/05/23  1233     History  Chief Complaint  Patient presents with   [name] is a 89 y.o. female.  With past medical history of falls, stroke, and HTN who presents to the ED after a controlled fall at home. Patient's family member was present during the fall and provides most details of events. Patient was pivoting to the toilet when she lost her balance. Patients family member states that the fall was very slow and she was able to help to patient down to the ground. Patient hit her right arm on a table and her left leg on her walker, which resulted in lacerations to both areas. Patient was able to bear weight on both legs without pain. Family members brought her to the ED to help care for her wounds. Patient denies head trauma, losing consciousness, headache, and vision changes.   Fall Pertinent negatives include no headaches.       Home Medications Prior to Admission medications   Medication Sig Start Date End Date Taking? Authorizing Provider  Acetaminophen 325 MG CAPS Take 325 mg by mouth 3 (three) times daily as needed (pain).    [provider]  amLODipine (NORVASC) 2.5 MG tablet Take 1 tablet (2.5 mg total) by mouth daily. 10/31/22   Eugenie Filler, MD  aspirin 81 MG chewable tablet Chew 81 mg by mouth daily.    [provider]  atorvastatin (LIPITOR) 80 MG tablet Take 1 tablet (80 mg total) by mouth daily at 6 PM. 08/20/15   Ghimire, Henreitta Leber, MD  benzonatate (TESSALON) 100 MG capsule Take 1 capsule (100 mg total) by mouth 3 (three) times daily as needed for cough. 10/31/22   Eugenie Filler, MD  brimonidine (ALPHAGAN) 0.2 % ophthalmic solution Place 1 drop into both eyes in the morning and at bedtime.    [provider]  Calcium Carbonate-Vitamin D (CALTRATE 600+D PO) Take 1 tablet by mouth daily. gummy    [provider]  camphor-menthol Timoteo Ace) lotion Apply topically as needed for itching. 10/31/22   Eugenie Filler, MD  chlorpheniramine (CHLOR-TRIMETON) 4 MG tablet Take 4 mg by mouth daily.    [provider]  Cranberry Extract 250 MG TABS as directed Orally    [provider]  [facility] SOD CHLORIDE HYPERTONICITY 5 % ophthalmic ointment PLACE 1 APPLICATION INTO THE RIGHT EYE AT BEDTIME. Patient taking differently: Place 1 Application into the right eye at bedtime. 07/01/22   Bernarda Caffey, MD  Cyanocobalamin 1000 MCG TBCR 1 tablet Orally Once a day for 30 day(s)    [provider]  denosumab (PROLIA) 60 MG/ML SOSY injection 60 mg See admin instructions. Patient not taking: Reported on 11/28/2022 12/18/17   [provider]  diclofenac Sodium (VOLTAREN) 1 % GEL Apply 2 g topically 4 (four) times daily. Apply to Knee 10/31/22   Eugenie Filler, MD  famotidine (PEPCID) 20 MG tablet Take 20 mg by mouth daily.    [provider]  FLUZONE HIGH-DOSE QUADRIVALENT 0.7 ML SUSY  07/14/19   [provider]  furosemide (LASIX) 40 MG tablet Take 20 mg by mouth daily. MONDAY , [facility] and FRIDAY 12/24/21   [provider]  GEMTESA 75 MG TABS Take 75 mg by mouth daily. 10/15/21   [provider]  gentamicin cream (GARAMYCIN) 0.1 % Apply 1 application  topically 2 (two) times daily. Patient not taking: Reported on 11/28/2022 11/29/21   Edrick Kins, DPM  Loteprednol Etabonate (LOTEMAX SM) 0.38 % GEL Place 1 drop into the right eye 2 (two) times daily. 07/01/22   Bernarda Caffey, MD  Menthol, Topical Analgesic, (BIOFREEZE) 4 % GEL Apply 1 Application topically daily as needed (shoulder pain).    [provider]  metoprolol tartrate (LOPRESSOR) 25 MG tablet Take 0.5 tablets (12.5 mg total) by mouth 2 (two) times daily. 10/31/22   Eugenie Filler, MD  PFIZER-BIONT COVID-19 VAC-TRIS SUSP injection  05/08/21   [provider]  Polyethyl  Glycol-Propyl Glycol (SYSTANE OP) Apply 1 drop to eye 2 (two) times daily as needed (dry eyes). Patient not taking: Reported on 11/28/2022    [provider]  sodium chloride (MURO 128) 5 % ophthalmic solution INSTILL 1 DROP INTO RIGHT EYE 4 TIMES A DAY 08/17/22   Bernarda Caffey, MD  timolol (TIMOPTIC) 0.5 % ophthalmic solution Place 1 drop into both eyes every morning. 11/01/21   [provider]      Allergies    Meloxicam, Nitrofurantoin, Other, Guaifenesin er, and Nitrofuran derivatives    Review of Systems   Review of Systems  Constitutional:  Negative for fever.  Eyes:  Negative for visual disturbance.  Musculoskeletal:  Negative for back pain, neck pain and neck stiffness.  Neurological:  Negative for dizziness, light-headedness, numbness and headaches.  Psychiatric/Behavioral:  Negative for confusion.     Physical Exam Updated Vital Signs BP (!) 152/78   Pulse 82   Temp 98.1 F (36.7 C) (Oral)   Resp 18   SpO2 95%  Physical Exam Vitals and nursing note reviewed.  Constitutional:      General: She is not in acute distress.    Appearance: Normal appearance. She is not ill-appearing or toxic-appearing.  HENT:     Head: Normocephalic and atraumatic.  Eyes:     Extraocular Movements: Extraocular movements intact.  Cardiovascular:     Rate and Rhythm: Normal rate.  Pulmonary:     Effort: Pulmonary effort is normal.  Abdominal:     General: Abdomen is flat.     Palpations: Abdomen is soft.  Musculoskeletal:        General: No swelling, tenderness or deformity. Normal range of motion.     Cervical back: Normal range of motion and neck supple. No rigidity or tenderness.  Skin:    Findings: No erythema or rash.     Comments: Multiple bruises on bilateral upper extremities. Steri-strip present on left arm from recent skin tear.  Anterior left leg: Laceration measuring ~4in in length. Right elbow: two skin tears present measuring ~1in and ~2in  Wounds  appear clean. Subcutaneous tissue exposed on leg wound with thin dermis. No purulence or foreign bodies.   Neurological:     General: No focal deficit present.     Mental Status: She is alert and oriented to person, place, and time.     Cranial Nerves: No cranial nerve deficit.     Sensory: No sensory deficit.  Psychiatric:        Mood and Affect: Mood normal.        Behavior: Behavior normal.     ED Results / Procedures / Treatments   Labs (all labs ordered are listed, but only abnormal results are displayed) Labs Reviewed - No data to display  EKG None  Radiology No results found.  Procedures Procedures    Medications Ordered  in ED Medications - No data to display  ED Course/ Medical Decision Making/ A&P                             Medical Decision Making This patient is a 89 y.o. female who presents to the ED for concern of laceration from fall.   Differential diagnoses prior to evaluation: Stroke, infection, head trauma  Past Medical History / Social History / Additional history: Stroke  Chart reviewed. Pertinent results include: multiple falls in the past  Physical Exam: Physical exam performed. The pertinent findings include: normal ROM of neck, hip, knee, shoulder and elbow. No bruises or tenderness to palpation of head, face, neck, hip, knee, and elbows. No neuro deficit. Three lacerations total.  Medications / Treatment: I gently rubbed wound cleaner onto lacerations. Steri-strips were used to close lacerations due to the thin nature of her skin and its inability to tolerate sutures. Non-adhesive dressing was placed on closed lacerations and wrapped with dressings.   Disposition: After consideration of the diagnostic results and the patients response to treatment, I feel that patient is safe for discharge. Her fall was slow and controlled and both patient and family members report no head trauma. She also has no tenderness or bruising of head and neck.  Patient was fully conscious during fall and has no neuro deficits, so I am less concerned about a stroke at this time. I am not concerned about broken hip or arm because the patient was able to perform ROM without pain and can bear weight on her legs while standing.   01/05/2023 emergency department workup does not suggest an emergent condition requiring admission or immediate intervention beyond what has been performed at this time. The plan is: To discharge the patient home and continue wounds care. I have verbally educated patient's family members on wound care and provided a handout. I also asked the family members to get in touch with her PCP to follow up on this fall. The patient is safe for discharge and has been instructed to return immediately for worsening symptoms, change in symptoms or any other concerns.            Final Clinical Impression(s) / ED Diagnoses Final diagnoses:  Laceration of skin of left lower leg, initial encounter    Rx / DC Orders ED Discharge Orders     None         Valente David, [place] 01/05/23 1409    Pattricia Boss, MD 01/06/23 1710

## 2023-01-05 NOTE — ED Notes (Signed)
Dc instructions reviewed with patient. Patient voiced understanding. Dc with belongings. Dc instructions reviewed with pt's family. Dressing changes reviewed as well, voiced understanding. Dressing supplies sent with patient.

## 2023-01-06 DIAGNOSIS — I083 Combined rheumatic disorders of mitral, aortic and tricuspid valves: Secondary | ICD-10-CM | POA: Diagnosis not present

## 2023-01-06 DIAGNOSIS — L97412 Non-pressure chronic ulcer of right heel and midfoot with fat layer exposed: Secondary | ICD-10-CM | POA: Diagnosis not present

## 2023-01-06 DIAGNOSIS — M1711 Unilateral primary osteoarthritis, right knee: Secondary | ICD-10-CM | POA: Diagnosis not present

## 2023-01-06 DIAGNOSIS — I2510 Atherosclerotic heart disease of native coronary artery without angina pectoris: Secondary | ICD-10-CM | POA: Diagnosis not present

## 2023-01-06 DIAGNOSIS — N1832 Chronic kidney disease, stage 3b: Secondary | ICD-10-CM | POA: Diagnosis not present

## 2023-01-06 DIAGNOSIS — G8929 Other chronic pain: Secondary | ICD-10-CM | POA: Diagnosis not present

## 2023-01-06 DIAGNOSIS — I270 Primary pulmonary hypertension: Secondary | ICD-10-CM | POA: Diagnosis not present

## 2023-01-06 DIAGNOSIS — I872 Venous insufficiency (chronic) (peripheral): Secondary | ICD-10-CM | POA: Diagnosis not present

## 2023-01-06 DIAGNOSIS — D509 Iron deficiency anemia, unspecified: Secondary | ICD-10-CM | POA: Diagnosis not present

## 2023-01-06 DIAGNOSIS — J9601 Acute respiratory failure with hypoxia: Secondary | ICD-10-CM | POA: Diagnosis not present

## 2023-01-06 DIAGNOSIS — M25511 Pain in right shoulder: Secondary | ICD-10-CM | POA: Diagnosis not present

## 2023-01-06 DIAGNOSIS — H3530 Unspecified macular degeneration: Secondary | ICD-10-CM | POA: Diagnosis not present

## 2023-01-06 DIAGNOSIS — I130 Hypertensive heart and chronic kidney disease with heart failure and stage 1 through stage 4 chronic kidney disease, or unspecified chronic kidney disease: Secondary | ICD-10-CM | POA: Diagnosis not present

## 2023-01-06 DIAGNOSIS — H547 Unspecified visual loss: Secondary | ICD-10-CM | POA: Diagnosis not present

## 2023-01-06 DIAGNOSIS — H35039 Hypertensive retinopathy, unspecified eye: Secondary | ICD-10-CM | POA: Diagnosis not present

## 2023-01-06 DIAGNOSIS — L89612 Pressure ulcer of right heel, stage 2: Secondary | ICD-10-CM | POA: Diagnosis not present

## 2023-01-06 DIAGNOSIS — I69354 Hemiplegia and hemiparesis following cerebral infarction affecting left non-dominant side: Secondary | ICD-10-CM | POA: Diagnosis not present

## 2023-01-06 DIAGNOSIS — M109 Gout, unspecified: Secondary | ICD-10-CM | POA: Diagnosis not present

## 2023-01-06 DIAGNOSIS — E538 Deficiency of other specified B group vitamins: Secondary | ICD-10-CM | POA: Diagnosis not present

## 2023-01-06 DIAGNOSIS — H409 Unspecified glaucoma: Secondary | ICD-10-CM | POA: Diagnosis not present

## 2023-01-06 DIAGNOSIS — E43 Unspecified severe protein-calorie malnutrition: Secondary | ICD-10-CM | POA: Diagnosis not present

## 2023-01-06 DIAGNOSIS — L89622 Pressure ulcer of left heel, stage 2: Secondary | ICD-10-CM | POA: Diagnosis not present

## 2023-01-06 DIAGNOSIS — F32A Depression, unspecified: Secondary | ICD-10-CM | POA: Diagnosis not present

## 2023-01-06 DIAGNOSIS — E785 Hyperlipidemia, unspecified: Secondary | ICD-10-CM | POA: Diagnosis not present

## 2023-01-06 DIAGNOSIS — I5033 Acute on chronic diastolic (congestive) heart failure: Secondary | ICD-10-CM | POA: Diagnosis not present

## 2023-01-09 ENCOUNTER — Telehealth: Payer: Self-pay

## 2023-01-09 ENCOUNTER — Ambulatory Visit: Payer: Medicare Other | Admitting: Podiatry

## 2023-01-09 DIAGNOSIS — I130 Hypertensive heart and chronic kidney disease with heart failure and stage 1 through stage 4 chronic kidney disease, or unspecified chronic kidney disease: Secondary | ICD-10-CM | POA: Diagnosis not present

## 2023-01-09 DIAGNOSIS — M25511 Pain in right shoulder: Secondary | ICD-10-CM | POA: Diagnosis not present

## 2023-01-09 DIAGNOSIS — E538 Deficiency of other specified B group vitamins: Secondary | ICD-10-CM | POA: Diagnosis not present

## 2023-01-09 DIAGNOSIS — M1711 Unilateral primary osteoarthritis, right knee: Secondary | ICD-10-CM | POA: Diagnosis not present

## 2023-01-09 DIAGNOSIS — I872 Venous insufficiency (chronic) (peripheral): Secondary | ICD-10-CM | POA: Diagnosis not present

## 2023-01-09 DIAGNOSIS — E785 Hyperlipidemia, unspecified: Secondary | ICD-10-CM | POA: Diagnosis not present

## 2023-01-09 DIAGNOSIS — L97412 Non-pressure chronic ulcer of right heel and midfoot with fat layer exposed: Secondary | ICD-10-CM | POA: Diagnosis not present

## 2023-01-09 DIAGNOSIS — F32A Depression, unspecified: Secondary | ICD-10-CM | POA: Diagnosis not present

## 2023-01-09 DIAGNOSIS — I083 Combined rheumatic disorders of mitral, aortic and tricuspid valves: Secondary | ICD-10-CM | POA: Diagnosis not present

## 2023-01-09 DIAGNOSIS — L89612 Pressure ulcer of right heel, stage 2: Secondary | ICD-10-CM | POA: Diagnosis not present

## 2023-01-09 DIAGNOSIS — L89622 Pressure ulcer of left heel, stage 2: Secondary | ICD-10-CM | POA: Diagnosis not present

## 2023-01-09 DIAGNOSIS — H409 Unspecified glaucoma: Secondary | ICD-10-CM | POA: Diagnosis not present

## 2023-01-09 DIAGNOSIS — M109 Gout, unspecified: Secondary | ICD-10-CM | POA: Diagnosis not present

## 2023-01-09 DIAGNOSIS — G8929 Other chronic pain: Secondary | ICD-10-CM | POA: Diagnosis not present

## 2023-01-09 DIAGNOSIS — I5033 Acute on chronic diastolic (congestive) heart failure: Secondary | ICD-10-CM | POA: Diagnosis not present

## 2023-01-09 DIAGNOSIS — H547 Unspecified visual loss: Secondary | ICD-10-CM | POA: Diagnosis not present

## 2023-01-09 DIAGNOSIS — H3530 Unspecified macular degeneration: Secondary | ICD-10-CM | POA: Diagnosis not present

## 2023-01-09 DIAGNOSIS — H35039 Hypertensive retinopathy, unspecified eye: Secondary | ICD-10-CM | POA: Diagnosis not present

## 2023-01-09 DIAGNOSIS — I270 Primary pulmonary hypertension: Secondary | ICD-10-CM | POA: Diagnosis not present

## 2023-01-09 DIAGNOSIS — E43 Unspecified severe protein-calorie malnutrition: Secondary | ICD-10-CM | POA: Diagnosis not present

## 2023-01-09 DIAGNOSIS — N1832 Chronic kidney disease, stage 3b: Secondary | ICD-10-CM | POA: Diagnosis not present

## 2023-01-09 DIAGNOSIS — I2510 Atherosclerotic heart disease of native coronary artery without angina pectoris: Secondary | ICD-10-CM | POA: Diagnosis not present

## 2023-01-09 DIAGNOSIS — J9601 Acute respiratory failure with hypoxia: Secondary | ICD-10-CM | POA: Diagnosis not present

## 2023-01-09 DIAGNOSIS — I69354 Hemiplegia and hemiparesis following cerebral infarction affecting left non-dominant side: Secondary | ICD-10-CM | POA: Diagnosis not present

## 2023-01-09 DIAGNOSIS — D509 Iron deficiency anemia, unspecified: Secondary | ICD-10-CM | POA: Diagnosis not present

## 2023-01-09 NOTE — Telephone Encounter (Signed)
     Patient  visit on 3/28  at [facility]   Have you been able to follow up with your primary care physician? Yes   The patient was or was not able to obtain any needed medicine or equipment. Yes   Are there diet recommendations that you are having difficulty following? Na   Patient expresses understanding of discharge instructions and education provided has no other needs at this time.  Yes      [facility] [phone] [address], [place], [place] [address] Phone: [phone] Email: [email or web address]

## 2023-01-09 NOTE — Progress Notes (Unsigned)
Cardiology Clinic Note   Patient Name: [name] Date of Encounter: 01/11/2023  Primary Care Provider:  [name], DO Primary Cardiologist:  Kirk Ruths, MD  Patient Profile    [name] 89 year old female presents the clinic today for follow-up evaluation of her coronary artery disease and essential hypertension.  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    Fall at home 09/26/2012   Glaucoma    OU   Gout, joint    Hypertension    Hypertensive retinopathy    OU   Macular degeneration    OU   NSTEMI (non-ST elevated myocardial infarction) 08/2015   Stroke    Past Surgical History:  Procedure Laterality Date   ABDOMINAL HYSTERECTOMY     BREAST SURGERY     CYST   CARDIAC CATHETERIZATION N/A 08/18/2015   Procedure: Left Heart Cath and Coronary Angiography;  Surgeon: Troy Sine, MD;  Location: [place] [facility];  Service: Cardiovascular;  Laterality: N/A;   CARDIAC CATHETERIZATION  08/18/2015   Procedure: Coronary Stent Intervention;  Surgeon: Troy Sine, MD;  Location: [place] [facility];  Service: Cardiovascular;;   CATARACT EXTRACTION Bilateral    DILATION AND CURETTAGE OF UTERUS     EYE SURGERY Bilateral    HERNIA REPAIR     IRIDOTOMY / IRIDECTOMY Right    JOINT REPLACEMENT  left knee   REFRACTIVE SURGERY Right 2017    Allergies  Allergies  Allergen Reactions   Meloxicam Hives   Nitrofurantoin    Other Other (See Comments)   Guaifenesin Er Rash    Other reaction(s): rash   Nitrofuran Derivatives Rash    History of Present Illness    [name] has a PMH of coronary artery disease, hyperlipidemia, hypertension, CVA, and anemia.  She underwent carotid ultrasound 10/16 which was unremarkable.  She had NSTEMI 11/16 and underwent PCI with PTCA to her distal RCA and DES.  Echocardiogram 11/16 showed an EF of  65-70%, mild LVH, moderate tricuspid valve regurgitation.  She had normal ABIs 9/20.  She was admitted to [facility] 1/24 for acute on chronic diastolic CHF exacerbation.  She was noted to have dyspnea and lower extremity edema.  She was noted to be hypothermic on arrival and was also found to have a UTI.  Her BNP was elevated at 580.4.  CT chest showed marked enlargement of her main pulmonary artery and right pulmonary artery which was significant for pulmonary arterial hypertension.  She was also noted to have small bilateral pleural effusion with bibasilar compressive atelectasis.  Echocardiogram 10/22/2022 showed an EF of 60 to 65% moderate LVH, G2 DD, normal RV function, severely elevated pulmonary artery systolic pressure and mild mitral valve regurgitation with moderate tricuspid valve regurgitation.  She was treated with IV diuresis and was discharged net -10.5 L.  She was discharged to skilled nursing.  She was seen in follow-up by Vikki Ports, PA-C on 11/28/2022.  She was accompanied by her daughter.  She is hard of hearing and her daughter helps with history.  She denied chest pain shortness of breath, orthopnea, palpitations, presyncope and syncope.  She was noted to have mild ankle edema.  Her lower extremity edema remained stable with Una boot and diuresis.  She was progressing with her physical activity with the help of physical  therapy/rehab.  She plans to return home that week.  Her dry weight was noted to be somewhere between 132 and 134 pounds.  She was wheelchair-bound.  She denied falls.  She presents to the clinic today for follow-up evaluation and states she feels okay.  She presents with her daughter.  Her daughter reports she had some ham and increased sodium food over the Easter holiday.  She also had [employer] yesterday.  Her weight today is noted to be 137 pounds.  Her blood pressure is well-controlled at 122/62.  She has a right foot wound which is being monitored by  wound care.  She has Curlex and Coban on bilateral lower extremities.  We reviewed the importance of heart healthy low-sodium diet and daily weights.  She expressed understanding.  I will increase her Lasix to 40 mg for 3 days and then have her return to her normal dosing.  We will give 20 mill equivalents of potassium for 3 days and stop.  I will plan a BMP for next week and plan follow-up in 2 to 3 weeks.  Today she denies chest pain, shortness of breath, lower extremity edema, fatigue, palpitations, melena, hematuria, hemoptysis, diaphoresis, weakness, presyncope, syncope, orthopnea, and PND.     Home Medications    Prior to Admission medications   Medication Sig Start Date End Date Taking? Authorizing Provider  Acetaminophen 325 MG CAPS Take 325 mg by mouth 3 (three) times daily as needed (pain).    [provider]  amLODipine (NORVASC) 2.5 MG tablet Take 1 tablet (2.5 mg total) by mouth daily. 10/31/22   Eugenie Filler, MD  aspirin 81 MG chewable tablet Chew 81 mg by mouth daily.    [provider]  atorvastatin (LIPITOR) 80 MG tablet Take 1 tablet (80 mg total) by mouth daily at 6 PM. 08/20/15   Ghimire, Henreitta Leber, MD  benzonatate (TESSALON) 100 MG capsule Take 1 capsule (100 mg total) by mouth 3 (three) times daily as needed for cough. 10/31/22   Eugenie Filler, MD  brimonidine (ALPHAGAN) 0.2 % ophthalmic solution Place 1 drop into both eyes in the morning and at bedtime.    [provider]  Calcium Carbonate-Vitamin D (CALTRATE 600+D PO) Take 1 tablet by mouth daily. gummy    [provider]  camphor-menthol Timoteo Ace) lotion Apply topically as needed for itching. 10/31/22   Eugenie Filler, MD  chlorpheniramine (CHLOR-TRIMETON) 4 MG tablet Take 4 mg by mouth daily.    [provider]  Cranberry Extract 250 MG TABS as directed Orally    [provider]  [employer] SOD CHLORIDE HYPERTONICITY 5 % ophthalmic ointment PLACE 1 APPLICATION  INTO THE RIGHT EYE AT BEDTIME. Patient taking differently: Place 1 Application into the right eye at bedtime. 07/01/22   Bernarda Caffey, MD  Cyanocobalamin 1000 MCG TBCR 1 tablet Orally Once a day for 30 day(s)    [provider]  denosumab (PROLIA) 60 MG/ML SOSY injection 60 mg See admin instructions. Patient not taking: Reported on 11/28/2022 12/18/17   [provider]  diclofenac Sodium (VOLTAREN) 1 % GEL Apply 2 g topically 4 (four) times daily. Apply to Knee 10/31/22   Eugenie Filler, MD  famotidine (PEPCID) 20 MG tablet Take 20 mg by mouth daily.    [provider]  FLUZONE HIGH-DOSE QUADRIVALENT 0.7 ML SUSY  07/14/19   [provider]  furosemide (LASIX) 40 MG tablet Take 20 mg by mouth daily. MONDAY , WEDNESDAY and  FRIDAY 12/24/21   [provider]  GEMTESA 75 MG TABS Take 75 mg by mouth daily. 10/15/21   [provider]  gentamicin cream (GARAMYCIN) 0.1 % Apply 1 application topically 2 (two) times daily. Patient not taking: Reported on 11/28/2022 11/29/21   Edrick Kins, DPM  Loteprednol Etabonate (LOTEMAX SM) 0.38 % GEL Place 1 drop into the right eye 2 (two) times daily. 07/01/22   Bernarda Caffey, MD  Menthol, Topical Analgesic, (BIOFREEZE) 4 % GEL Apply 1 Application topically daily as needed (shoulder pain).    [provider]  metoprolol tartrate (LOPRESSOR) 25 MG tablet Take 0.5 tablets (12.5 mg total) by mouth 2 (two) times daily. 10/31/22   Eugenie Filler, MD  PFIZER-BIONT COVID-19 VAC-TRIS SUSP injection  05/08/21   [provider]  Polyethyl Glycol-Propyl Glycol (SYSTANE OP) Apply 1 drop to eye 2 (two) times daily as needed (dry eyes). Patient not taking: Reported on 11/28/2022    [provider]  sodium chloride (MURO 128) 5 % ophthalmic solution INSTILL 1 DROP INTO RIGHT EYE 4 TIMES A DAY 08/17/22   Bernarda Caffey, MD  timolol (TIMOPTIC) 0.5 % ophthalmic solution Place 1 drop into both eyes every  morning. 11/01/21   [provider]    Family History    Family History  Problem Relation Age of Onset   Heart disease Mother    Stroke Mother    She indicated that her mother is deceased. She indicated that her father is deceased.  Social History    Social History   Socioeconomic History   Marital status: Divorced    Spouse name: Not on file   Number of children: 3   Years of education: 12   Highest education level: Not on file  Occupational History    Comment: retired  Tobacco Use   Smoking status: Never   Smokeless tobacco: Never  Vaping Use   Vaping Use: Never used  Substance and Sexual Activity   Alcohol use: No    Alcohol/week: 0.0 standard drinks of alcohol   Drug use: No   Sexual activity: Never    Birth control/protection: Post-menopausal  Other Topics Concern   Not on file  Social History Narrative   Patient is single and lives at home alone.   Retired   Education 12th grade    Right handed   Caffeine  sometimes   Social Determinants of Radio broadcast assistant Strain: Not on file  Food Insecurity: No Food Insecurity (10/21/2022)   Hunger Vital Sign    Worried About Running Out of Food in the Last Year: Never true    [place] in the Last Year: Never true  Transportation Needs: No Transportation Needs (12/26/2022)   PRAPARE - Hydrologist (Medical): No    Lack of Transportation (Non-Medical): No  Physical Activity: Inactive (12/26/2022)   Exercise Vital Sign    Days of Exercise per Week: 0 days    Minutes of Exercise per Session: 0 min  Stress: Not on file  Social Connections: Not on file  Intimate Partner Violence: Not At Risk (10/21/2022)   Humiliation, Afraid, Rape, and Kick questionnaire    Fear of Current or Ex-Partner: No    Emotionally Abused: No    Physically Abused: No    Sexually Abused: No     Review of Systems    General:  No chills, fever, night sweats or weight changes.   Cardiovascular:  No chest pain, dyspnea on exertion, edema, orthopnea, palpitations, paroxysmal nocturnal dyspnea. Dermatological: No rash, lesions/masses Respiratory: No cough, dyspnea Urologic: No hematuria, dysuria Abdominal:   No nausea, vomiting, diarrhea, bright red blood per rectum, melena, or hematemesis Neurologic:  No visual changes, wkns, changes in mental status. All other systems reviewed and are otherwise negative except as noted above.  Physical Exam    VS:  BP 122/62   Pulse 74   Ht 4\' 11"  (1.499 m)   Wt 137 lb (62.1 kg) Comment: Pt's daughter reports at home weight  SpO2 94%   BMI 27.67 kg/m  , BMI Body mass index is 27.67 kg/m. GEN: Well nourished, well developed, in no acute distress. HEENT: normal. Neck: Supple, no JVD, carotid bruits, or masses. Cardiac: RRR, no murmurs, rubs, or gallops. No clubbing, cyanosis, bilateral lower extremity 2+ pitting edema.  Radials/DP/PT 2+ and equal bilaterally.  Respiratory:  Respirations regular and unlabored, clear to auscultation bilaterally. GI: Soft, nontender, nondistended, BS + x 4. MS: no deformity or atrophy. Skin: warm and dry, no rash. Neuro:  Strength and sensation are intact. Psych: Normal affect.  Accessory Clinical Findings    Recent Labs: 10/21/2022: TSH 3.088 10/26/2022: ALT 15 10/27/2022: B Natriuretic Peptide 699.9; Magnesium 2.5 10/31/2022: BUN 81; Creatinine, Ser 1.72; Hemoglobin 9.7; Platelets 301; Potassium 4.6; Sodium 137   Recent Lipid Panel    Component Value Date/Time   CHOL 147 08/11/2017 0844   TRIG 128 08/11/2017 0844   HDL 71 08/11/2017 0844   CHOLHDL 2.1 08/11/2017 0844   CHOLHDL 2.2 06/23/2016 0845   VLDL 29 06/23/2016 0845   LDLCALC 50 08/11/2017 0844         ECG personally reviewed by me today-none today.  Echocardiogram 10/22/2022  IMPRESSIONS     1. Small mid cavitary gradient . Left ventricular ejection fraction, by  estimation, is 60 to 65%. The left ventricle has  normal function. The left  ventricle has no regional wall motion abnormalities. There is moderate  left ventricular hypertrophy. Left  ventricular diastolic parameters are consistent with Grade II diastolic  dysfunction (pseudonormalization). Elevated left ventricular end-diastolic  pressure.   2. Right ventricular systolic function is normal. The right ventricular  size is normal. There is severely elevated pulmonary artery systolic  pressure.   3. Mean gradient across valve in diastole 12 mmHg but MVA normal by PT1/2  secondary to MAC. The mitral valve is normal in structure. Mild mitral  valve regurgitation. No evidence of mitral stenosis. Moderate mitral  annular calcification.   4. Tricuspid valve regurgitation is moderate.   5. The aortic valve is tricuspid. There is moderate calcification of the  aortic valve. There is moderate thickening of the aortic valve. Aortic  valve regurgitation is not visualized. Aortic valve  sclerosis/calcification is present, without any evidence  of aortic stenosis.   6. The inferior vena cava is normal in size with greater than 50%  respiratory variability, suggesting right atrial pressure of 3 mmHg.   FINDINGS   Left Ventricle: Small mid cavitary gradient. Left ventricular ejection  fraction, by estimation, is 60 to 65%. The left ventricle has normal  function. The left ventricle has no regional wall motion abnormalities.  The left ventricular internal cavity size   was normal in size. There is moderate left ventricular hypertrophy. Left  ventricular diastolic parameters are consistent with Grade II diastolic  dysfunction (pseudonormalization). Elevated left ventricular end-diastolic  pressure.   Right Ventricle: The right ventricular size is normal.  No increase in  right ventricular wall thickness. Right ventricular systolic function is  normal. There is severely elevated pulmonary artery systolic pressure. The  tricuspid regurgitant velocity  is  3.43 m/s, and with an assumed right atrial pressure of 15 mmHg, the  estimated right ventricular systolic pressure is 99991111 mmHg.   Left Atrium: Left atrial size was normal in size.   Right Atrium: Right atrial size was normal in size.   Pericardium: There is no evidence of pericardial effusion.   Mitral Valve: Mean gradient across valve in diastole 12 mmHg but MVA  normal by PT1/2 secondary to MAC. The mitral valve is normal in structure.  There is moderate thickening of the mitral valve leaflet(s). Moderate  mitral annular calcification. Mild  mitral valve regurgitation. No evidence of mitral valve stenosis. MV peak  gradient, 23.8 mmHg. The mean mitral valve gradient is 12.0 mmHg.   Tricuspid Valve: The tricuspid valve is normal in structure. Tricuspid  valve regurgitation is moderate . No evidence of tricuspid stenosis.   Aortic Valve: The aortic valve is tricuspid. There is moderate  calcification of the aortic valve. There is moderate thickening of the  aortic valve. Aortic valve regurgitation is not visualized. Aortic valve  sclerosis/calcification is present, without any   evidence of aortic stenosis. Aortic valve mean gradient measures 11.0  mmHg. Aortic valve peak gradient measures 19.7 mmHg. Aortic valve area, by  VTI measures 2.46 cm.   Pulmonic Valve: The pulmonic valve was normal in structure. Pulmonic valve  regurgitation is not visualized. No evidence of pulmonic stenosis.   Aorta: The aortic root is normal in size and structure.   Venous: The inferior vena cava is normal in size with greater than 50%  respiratory variability, suggesting right atrial pressure of 3 mmHg.   IAS/Shunts: No atrial level shunt detected by color flow Doppler.    Assessment & Plan   1.  Chronic diastolic CHF, pulmonary hypertension-weight today 137.  Generalized nonpitting bilateral ankle edema.  Has been continuing physical therapy exercises at home.  Return to normal daily  activities. Increase furosemide to 40 mg x 3 days then resume every other day 20 mg dosing Start potassium 20 mill equivalents x 3 days and stop Heart healthy low-sodium diet-salty 6 given Maintain physical activity Daily weights-contact office with a weight increase of 2 to 3 pounds overnight or 5 pounds in 1 week. Weight log BMP in 1 week  Essential hypertension-BP today 122/62. Well-controlled at home. Continue current medical therapy Maintain blood pressure log  Coronary artery disease-no chest pain today.  Underwent successful PCI with PTCA and DES to her RCA 11/16. Continue amlodipine, atorvastatin, metoprolol  Hyperlipidemia-LDL 61 on 10/21. Continue aspirin, atorvastatin Follows with PCP  Disposition: Follow-up with Dr. Stanford Breed or me in 3 weeks  Jossie Ng. Rayquon Uselman NP-C     01/11/2023, 11:29 AM [place] [address] Office [phone] Fax [phone]    I spent 14 minutes examining this patient, reviewing medications, and using patient centered shared decision making involving her cardiac care.  Prior to her visit I spent greater than 20 minutes reviewing her past medical history,  medications, and prior cardiac tests.

## 2023-01-10 ENCOUNTER — Encounter (HOSPITAL_BASED_OUTPATIENT_CLINIC_OR_DEPARTMENT_OTHER): Payer: Medicare Other | Admitting: General Surgery

## 2023-01-11 ENCOUNTER — Ambulatory Visit: Payer: Medicare Other | Admitting: Podiatry

## 2023-01-11 ENCOUNTER — Encounter: Payer: Self-pay | Admitting: General Practice

## 2023-01-11 ENCOUNTER — Ambulatory Visit: Payer: Medicare Other | Attending: General Practice | Admitting: General Practice

## 2023-01-11 VITALS — BP 122/62 | HR 74 | Ht 59.0 in | Wt 137.0 lb

## 2023-01-11 DIAGNOSIS — I5032 Chronic diastolic (congestive) heart failure: Secondary | ICD-10-CM

## 2023-01-11 DIAGNOSIS — E7800 Pure hypercholesterolemia, unspecified: Secondary | ICD-10-CM | POA: Diagnosis not present

## 2023-01-11 DIAGNOSIS — L97512 Non-pressure chronic ulcer of other part of right foot with fat layer exposed: Secondary | ICD-10-CM

## 2023-01-11 DIAGNOSIS — I10 Essential (primary) hypertension: Secondary | ICD-10-CM | POA: Diagnosis not present

## 2023-01-11 DIAGNOSIS — I2510 Atherosclerotic heart disease of native coronary artery without angina pectoris: Secondary | ICD-10-CM | POA: Diagnosis not present

## 2023-01-11 MED ORDER — POTASSIUM CHLORIDE CRYS ER 20 MEQ PO TBCR: 20.0000 meq | EXTENDED_RELEASE_TABLET | Freq: Every day | ORAL | 0 refills | Status: DC

## 2023-01-11 NOTE — Patient Instructions (Signed)
Medication Instructions:  TAKE FUROSEMIDE(LASIX) 40MG  x3 DAYS THEN BACK TO 20MG  EVERY OTHER DAY  TAKE POTASSIUM 20MEQ x3 DAYS  *If you need a refill on your cardiac medications before your next appointment, please call your pharmacy*  Lab Work: BMET NEXT WEEK If you have labs (blood work) drawn today and your tests are completely normal, you will receive your results only by:  [place] (if you have MyChart) OR  A paper copy in the mail If you have any lab test that is abnormal or we need to change your treatment, we will call you to review the results.  Other Instructions TAKE AND LOG YOUR WEIGHT DAILY   PLEASE READ AND FOLLOW ATTACHED  SALTY 6  Follow-Up: At [facility], you and your health needs are our priority.  As part of our continuing mission to provide you with exceptional heart care, we have created designated Provider Care Teams.  These Care Teams include your primary Cardiologist (physician) and Advanced Practice Providers (APPs -  Physician Assistants and Nurse Practitioners) who all work together to provide you with the care you need, when you need it.  We recommend signing up for the patient portal called "MyChart".  Sign up information is provided on this After Visit Summary.  MyChart is used to connect with patients for Virtual Visits (Telemedicine).  Patients are able to view lab/test results, encounter notes, upcoming appointments, etc.  Non-urgent messages can be sent to your provider as well.   To learn more about what you can do with MyChart, go to [email or web address].    Your next appointment:   2-3 week(s)  Provider:   Kirk Ruths, MD  or Coletta Memos, FNP

## 2023-01-11 NOTE — Progress Notes (Signed)
Chief Complaint  Patient presents with   Foot Ulcer    Patient came in today for right foot heel and bunion ulcer, patient has some drainage,     Subjective:  89 y.o. female with PMHx of diabetes mellitus presenting today with her granddaughter for follow-up routine foot care and for evaluation of chronic ulcers to the plantar aspect of the right forefoot.  Overall the patient is doing well.  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    Fall at home 09/26/2012   Glaucoma    OU   Gout, joint    Hypertension    Hypertensive retinopathy    OU   Macular degeneration    OU   NSTEMI (non-ST elevated myocardial infarction) 08/2015   Stroke    Past Surgical History:  Procedure Laterality Date   ABDOMINAL HYSTERECTOMY     BREAST SURGERY     CYST   CARDIAC CATHETERIZATION N/A 08/18/2015   Procedure: Left Heart Cath and Coronary Angiography;  Surgeon: Troy Sine, MD;  Location: [facility];  Service: Cardiovascular;  Laterality: N/A;   CARDIAC CATHETERIZATION  08/18/2015   Procedure: Coronary Stent Intervention;  Surgeon: Troy Sine, MD;  Location: [facility];  Service: Cardiovascular;;   CATARACT EXTRACTION Bilateral    DILATION AND CURETTAGE OF UTERUS     EYE SURGERY Bilateral    HERNIA REPAIR     IRIDOTOMY / IRIDECTOMY Right    JOINT REPLACEMENT  left knee   REFRACTIVE SURGERY Right 2017   Allergies  Allergen Reactions   Meloxicam Hives   Nitrofurantoin    Other Other (See Comments)   Guaifenesin Er Rash    Other reaction(s): rash   Nitrofuran Derivatives Rash    LT heel 11/14/2022   RT heel 11/14/2022   RT foot 11/14/2022   RT foot 12/12/2022  Objective/Physical Exam General: The patient is alert and oriented x3 in no acute distress.  Dermatology:  Posterior heel ulcers appear significantly improved.  There is  increased deterioration of the ulcer to the bunion area of the right foot.  Tophaceous gout noted within the ulcer with bone exposed.  Clinically there is no aggressive infection or underlying cellulitis.  It appears somewhat stable  Vascular: Chronic bilateral lower extremity edema noted.  No erythema noted today VAS [place] LOWER EXT ART SEG MULTI 06/25/2019 ABI Findings:  +---------+------------------+-----+-----------+--------+  Right   Rt Pressure (mmHg)IndexWaveform   Comment   +---------+------------------+-----+-----------+--------+  Brachial 172                                         +---------+------------------+-----+-----------+--------+  CFA                            triphasic            +---------+------------------+-----+-----------+--------+  Popliteal                      triphasic            +---------+------------------+-----+-----------+--------+  ATA     165               0.96 biphasic             +---------+------------------+-----+-----------+--------+  PTA     179               1.04 multiphasic          +---------+------------------+-----+-----------+--------+  PERO    149               0.87 biphasic             +---------+------------------+-----+-----------+--------+  Great Toe106               0.62 Normal               +---------+------------------+-----+-----------+--------+   +---------+------------------+-----+---------+-------+  Left    Lt Pressure (mmHg)IndexWaveform Comment  +---------+------------------+-----+---------+-------+  Brachial 162                                      +---------+------------------+-----+---------+-------+  CFA                            triphasic         +---------+------------------+-----+---------+-------+  Popliteal                      triphasic         +---------+------------------+-----+---------+-------+  ATA     162               0.94 triphasic          +---------+------------------+-----+---------+-------+  PTA     177               1.03 triphasic         +---------+------------------+-----+---------+-------+  PERO    170               0.99 biphasic          +---------+------------------+-----+---------+-------+  Great Toe143               0.83 Normal            +---------+------------------+-----+---------+-------+   +-------+-----------+-----------+------------+------------+  ABI/TBIToday's ABIToday's TBIPrevious ABIPrevious TBI  +-------+-----------+-----------+------------+------------+  Right 1.04       0.62                                 +-------+-----------+-----------+------------+------------+  Left  1.03       0.83                                 +-------+-----------+-----------+------------+------------+     Right second toe hammertoe deformity, could not obtain PPG tracing  PPG tracings display appropriate pulsatility.    Summary:  Right: Resting right ankle-brachial index is within normal range. No  evidence of significant right lower extremity arterial disease. The right  toe-brachial index is mildly abnormal.   Left: Resting left ankle-brachial index is within normal range. No  evidence of significant left lower extremity arterial disease. The left  toe-brachial index is normal.   Neurological: Light touch and protective threshold diminished bilaterally.   Musculoskeletal Exam: Fat pad atrophy with tenderness to palpation right forefoot.  Palpation of the metatarsal heads noted specifically to the subsecond and third MTPJ  Radiographic exam B/L feet 08/15/2022: Diffuse degenerative changes noted throughout the foot and osteopenia noted which is expected given the patient's age.  No osseous erosions or concern  for underlying bone infection/osteomyelitis.  Posterior heel spurs noted bilateral.  Assessment: 1.  Preulcerative callus right plantar forefoot secondary to  diabetes mellitus 2.  Ulcer posterior heel bilateral 3.  Chronic lower extremity edema  4.  Diabetes mellitus with peripheral polyneuropathy   Plan of Care:  -Patient was evaluated.  Continue palliative nonsurgical care for the patient.   -Medically necessary excisional debridement including subcutaneous tissue was performed using a tissue nipper.  Excisional debridement of all necrotic nonviable tissue down to healthier bleeding viable tissue was performed with postdebridement measurement same as pre- -Continue WBAT postsurgical shoes -Continue management at the [facility] wound care center.  Greatly appreciated -Patient has an appointment tomorrow with infectious disease for culture results of staphylococcus to the right foot ulcer -Return to clinic as needed for callus/ulcer debridement right forefoot  Edrick Kins, DPM [facility]  Dr. Edrick Kins, DPM    [address], [place] [address]                Office [phone]  Fax [phone]

## 2023-01-12 ENCOUNTER — Other Ambulatory Visit: Payer: Self-pay

## 2023-01-12 ENCOUNTER — Ambulatory Visit: Payer: Medicare Other | Admitting: Internal Medicine

## 2023-01-12 ENCOUNTER — Other Ambulatory Visit (HOSPITAL_COMMUNITY): Payer: Self-pay

## 2023-01-12 ENCOUNTER — Encounter: Payer: Self-pay | Admitting: Internal Medicine

## 2023-01-12 VITALS — BP 130/78 | HR 63 | Temp 98.3°F | Resp 16

## 2023-01-12 DIAGNOSIS — M86671 Other chronic osteomyelitis, right ankle and foot: Secondary | ICD-10-CM

## 2023-01-12 DIAGNOSIS — M869 Osteomyelitis, unspecified: Secondary | ICD-10-CM | POA: Insufficient documentation

## 2023-01-12 DIAGNOSIS — N183 Chronic kidney disease, stage 3 unspecified: Secondary | ICD-10-CM

## 2023-01-12 MED ORDER — MINOCYCLINE HCL 100 MG PO CAPS: 200.0000 mg | ORAL_CAPSULE | Freq: Two times a day (BID) | ORAL | 0 refills | Status: DC

## 2023-01-12 NOTE — Progress Notes (Signed)
[facility] for Infectious Disease  Reason for Consult: Foot osteomyelitis  Referring Provider: Dr Heber Bylas   HPI:    [name] is a 89 y.o. female with PMHx as below who presents to the clinic for osteomyelitis of the foot.   Patient was referred by wound care where she has been seeing Dr Heber [place].  She also sees Dr Amalia Hailey at the podiatry office.  During visit with wound care on 12/21/22 she was noted to have a large medial foot wound with bone easily visible.  She had a bone biopsy that revealed osteomyelitis and cultures that grew Staph lugdunensis and Stenotrophomonas.  She was referred to [place] for further antibiotic recommendations.  She has not had any fevers, chills, or other systemic symptoms.  She has been getting wound care and reports the wound is getting smaller but there has been some oozing and the [facility] RN noted that she has not seen healthy tissue filling in yet.     Patient's Medications  New Prescriptions   MINOCYCLINE (MINOCIN) 100 MG CAPSULE    Take 2 capsules (200 mg total) by mouth 2 (two) times daily.  Previous Medications   ACETAMINOPHEN 325 MG CAPS    Take 325 mg by mouth 3 (three) times daily as needed (pain).   AMLODIPINE (NORVASC) 2.5 MG TABLET    Take 1 tablet (2.5 mg total) by mouth daily.   ASPIRIN 81 MG CHEWABLE TABLET    Chew 81 mg by mouth daily.   ATORVASTATIN (LIPITOR) 80 MG TABLET    Take 1 tablet (80 mg total) by mouth daily at 6 PM.   BENZONATATE (TESSALON) 100 MG CAPSULE    Take 1 capsule (100 mg total) by mouth 3 (three) times daily as needed for cough.   BRIMONIDINE (ALPHAGAN) 0.2 % OPHTHALMIC SOLUTION    Place 1 drop into both eyes in the morning and at bedtime.   CALCIUM CARBONATE-VITAMIN D (CALTRATE 600+D PO)    Take 1 tablet by mouth daily. gummy   CAMPHOR-MENTHOL (SARNA) LOTION    Apply topically as needed for itching.   CHLORPHENIRAMINE (CHLOR-TRIMETON) 4 MG TABLET    Take 4 mg by mouth daily.   CRANBERRY EXTRACT 250 MG TABS    as  directed Orally   [employer] SOD CHLORIDE HYPERTONICITY 5 % OPHTHALMIC OINTMENT    PLACE 1 APPLICATION INTO THE RIGHT EYE AT BEDTIME.   CYANOCOBALAMIN 1000 MCG TBCR    1 tablet Orally Once a day for 30 day(s)   DENOSUMAB (PROLIA) 60 MG/ML SOSY INJECTION    60 mg See admin instructions.   DICLOFENAC SODIUM (VOLTAREN) 1 % GEL    Apply 2 g topically 4 (four) times daily. Apply to Knee   FAMOTIDINE (PEPCID) 20 MG TABLET    Take 20 mg by mouth daily.   FLUZONE HIGH-DOSE QUADRIVALENT 0.7 ML SUSY       FUROSEMIDE (LASIX) 40 MG TABLET    Take 20 mg by mouth daily. MONDAY , WEDNESDAY and FRIDAY   GEMTESA 75 MG TABS    Take 75 mg by mouth daily.   GENTAMICIN CREAM (GARAMYCIN) 0.1 %    Apply 1 application topically 2 (two) times daily.   LOTEPREDNOL ETABONATE (LOTEMAX SM) 0.38 % GEL    Place 1 drop into the right eye 2 (two) times daily.   MENTHOL, TOPICAL ANALGESIC, (BIOFREEZE) 4 % GEL    Apply 1 Application topically daily as needed (shoulder pain).   METOPROLOL TARTRATE (  LOPRESSOR) 25 MG TABLET    Take 0.5 tablets (12.5 mg total) by mouth 2 (two) times daily.   PFIZER-BIONT COVID-19 VAC-TRIS SUSP INJECTION       POLYETHYL GLYCOL-PROPYL GLYCOL (SYSTANE OP)    Apply 1 drop to eye 2 (two) times daily as needed (dry eyes).   POTASSIUM CHLORIDE SA (KLOR-CON M) 20 MEQ TABLET    Take 1 tablet (20 mEq total) by mouth daily.   SODIUM CHLORIDE (MURO 128) 5 % OPHTHALMIC SOLUTION    INSTILL 1 DROP INTO RIGHT EYE 4 TIMES A DAY   TIMOLOL (TIMOPTIC) 0.5 % OPHTHALMIC SOLUTION    Place 1 drop into both eyes every morning.  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    Fall at home 09/26/2012   Glaucoma    OU   Gout, joint    Hypertension    Hypertensive retinopathy    OU   Macular degeneration    OU   NSTEMI  (non-ST elevated myocardial infarction) 08/2015   Stroke     Social History   Tobacco Use   Smoking status: Never   Smokeless tobacco: Never  Vaping Use   Vaping Use: Never used  Substance Use Topics   Alcohol use: No    Alcohol/week: 0.0 standard drinks of alcohol   Drug use: No    Family History  Problem Relation Age of Onset   Heart disease Mother    Stroke Mother     Allergies  Allergen Reactions   Meloxicam Hives   Nitrofurantoin    Other Other (See Comments)   Guaifenesin Er Rash    Other reaction(s): rash   Nitrofuran Derivatives Rash    Review of Systems  All other systems reviewed and are negative.  Except as noted above.    OBJECTIVE:    Vitals:   01/12/23 1346  BP: 130/78  Pulse: 63  Resp: 16  Temp: 98.3 F (36.8 C)  TempSrc: Temporal  SpO2: 96%     There is no height or weight on file to calculate BMI.  Physical Exam Constitutional:      General: She is not in acute distress. HENT:     Head: Normocephalic and atraumatic.  Eyes:     Extraocular Movements: Extraocular movements intact.     Conjunctiva/sclera: Conjunctivae normal.  Pulmonary:     Effort: Pulmonary effort is normal. No respiratory distress.  Abdominal:     General: There is no distension.     Palpations: Abdomen is soft.  Musculoskeletal:     Cervical back: Normal range of motion and neck supple.     Comments: Mild edema noted in her right toes.  Unna boot in place on the right.  Images reviewed.   Skin:    General: Skin is warm and dry.     Findings: No rash.  Neurological:     Mental Status: She is alert.      Labs and Microbiology:     Latest Ref Rng & Units 10/31/2022    4:33 AM 10/30/2022    5:27 AM 10/29/2022    5:10 AM  CBC  WBC 4.0 - 10.5 K/uL 10.6  11.1  10.5   Hemoglobin 12.0 - 15.0 g/dL 9.7  10.3  11.7  Hematocrit 36.0 - 46.0 % 31.8  34.7  38.7   Platelets 150 - 400 K/uL 301  280  241       Latest Ref Rng & Units 10/31/2022    4:33 AM  10/30/2022    5:27 AM 10/29/2022    8:07 AM  CMP  Glucose 70 - 99 mg/dL 115  100  115   BUN 8 - 23 mg/dL 81  78  78   Creatinine 0.44 - 1.00 mg/dL 1.72  1.84  1.72   Sodium 135 - 145 mmol/L 137  139  139   Potassium 3.5 - 5.1 mmol/L 4.6  4.8  5.4   Chloride 98 - 111 mmol/L 104  102  103   CO2 22 - 32 mmol/L 24  27  25    Calcium 8.9 - 10.3 mg/dL 8.7  8.7  9.1        ASSESSMENT & PLAN:    Pyogenic inflammation of bone Patient presents today as a new patient for evaluation of a non-healing ulcer of her right 1st metatarsal head complicated by osteomyelitis with cultures showing Staph lugdunensis and Stenotrophomonas.  Discussed with patient and her daughter the difficulty with treating this deep seated infection.  Typically, this type of infection would entail debridement and/or amputation of necrotic osteomyelitic bone to help chances of cure in addition to antibiotics.  However, at her advanced age a non-surgical approach is being pursued which is reasonable.  Based on her cultures, will prescribe Minocycline 200mg  BID x 6 weeks.  We are avoiding Bactrim given her advanced CKD and Levaquin monotherapy for Ree Kida is not recommended.  We don't have minocycline sensitivities on the [facility] isolate but presumably it is sensitive and is our only reasonable option in this setting.  This will also cover the Staph lugdunensis isolate which was Tetracycline sensitive.  Will obtain baseline ESR/CRP today and follow up in 4 weeks.   CKD (chronic kidney disease), stage III ([place])   Patient creatinine clearance of about 21 makes use of Bactrim in this setting prohibitive.   Orders Placed This Encounter  Procedures   Sedimentation rate   C-reactive protein        [facility] for Infectious [facility] 01/12/2023, 2:35 PM

## 2023-01-12 NOTE — Assessment & Plan Note (Signed)
Patient presents today as a new patient for evaluation of a non-healing ulcer of her right 1st metatarsal head complicated by osteomyelitis with cultures showing Staph lugdunensis and Stenotrophomonas.  Discussed with patient and her daughter the difficulty with treating this deep seated infection.  Typically, this type of infection would entail debridement and/or amputation of necrotic osteomyelitic bone to help chances of cure in addition to antibiotics.  However, at her advanced age a non-surgical approach is being pursued which is reasonable.  Based on her cultures, will prescribe Minocycline 200mg  BID x 6 weeks.  We are avoiding Bactrim given her advanced CKD and Levaquin monotherapy for Ree Kida is not recommended.  We don't have minocycline sensitivities on the [facility] isolate but presumably it is sensitive and is our only reasonable option in this setting.  This will also cover the Staph lugdunensis isolate which was Tetracycline sensitive.  Will obtain baseline ESR/CRP today and follow up in 4 weeks.

## 2023-01-12 NOTE — Assessment & Plan Note (Signed)
   Patient creatinine clearance of about 21 makes use of Bactrim in this setting prohibitive.

## 2023-01-13 DIAGNOSIS — H409 Unspecified glaucoma: Secondary | ICD-10-CM | POA: Diagnosis not present

## 2023-01-13 DIAGNOSIS — I5033 Acute on chronic diastolic (congestive) heart failure: Secondary | ICD-10-CM | POA: Diagnosis not present

## 2023-01-13 DIAGNOSIS — J9601 Acute respiratory failure with hypoxia: Secondary | ICD-10-CM | POA: Diagnosis not present

## 2023-01-13 DIAGNOSIS — H3530 Unspecified macular degeneration: Secondary | ICD-10-CM | POA: Diagnosis not present

## 2023-01-13 DIAGNOSIS — M25511 Pain in right shoulder: Secondary | ICD-10-CM | POA: Diagnosis not present

## 2023-01-13 DIAGNOSIS — E785 Hyperlipidemia, unspecified: Secondary | ICD-10-CM | POA: Diagnosis not present

## 2023-01-13 DIAGNOSIS — E538 Deficiency of other specified B group vitamins: Secondary | ICD-10-CM | POA: Diagnosis not present

## 2023-01-13 DIAGNOSIS — L89612 Pressure ulcer of right heel, stage 2: Secondary | ICD-10-CM | POA: Diagnosis not present

## 2023-01-13 DIAGNOSIS — L97412 Non-pressure chronic ulcer of right heel and midfoot with fat layer exposed: Secondary | ICD-10-CM | POA: Diagnosis not present

## 2023-01-13 DIAGNOSIS — G8929 Other chronic pain: Secondary | ICD-10-CM | POA: Diagnosis not present

## 2023-01-13 DIAGNOSIS — F32A Depression, unspecified: Secondary | ICD-10-CM | POA: Diagnosis not present

## 2023-01-13 DIAGNOSIS — M1711 Unilateral primary osteoarthritis, right knee: Secondary | ICD-10-CM | POA: Diagnosis not present

## 2023-01-13 DIAGNOSIS — L89622 Pressure ulcer of left heel, stage 2: Secondary | ICD-10-CM | POA: Diagnosis not present

## 2023-01-13 DIAGNOSIS — I2510 Atherosclerotic heart disease of native coronary artery without angina pectoris: Secondary | ICD-10-CM | POA: Diagnosis not present

## 2023-01-13 DIAGNOSIS — I872 Venous insufficiency (chronic) (peripheral): Secondary | ICD-10-CM | POA: Diagnosis not present

## 2023-01-13 DIAGNOSIS — N1832 Chronic kidney disease, stage 3b: Secondary | ICD-10-CM | POA: Diagnosis not present

## 2023-01-13 DIAGNOSIS — H547 Unspecified visual loss: Secondary | ICD-10-CM | POA: Diagnosis not present

## 2023-01-13 DIAGNOSIS — I270 Primary pulmonary hypertension: Secondary | ICD-10-CM | POA: Diagnosis not present

## 2023-01-13 DIAGNOSIS — I130 Hypertensive heart and chronic kidney disease with heart failure and stage 1 through stage 4 chronic kidney disease, or unspecified chronic kidney disease: Secondary | ICD-10-CM | POA: Diagnosis not present

## 2023-01-13 DIAGNOSIS — D509 Iron deficiency anemia, unspecified: Secondary | ICD-10-CM | POA: Diagnosis not present

## 2023-01-13 DIAGNOSIS — E43 Unspecified severe protein-calorie malnutrition: Secondary | ICD-10-CM | POA: Diagnosis not present

## 2023-01-13 DIAGNOSIS — H35039 Hypertensive retinopathy, unspecified eye: Secondary | ICD-10-CM | POA: Diagnosis not present

## 2023-01-13 DIAGNOSIS — I69354 Hemiplegia and hemiparesis following cerebral infarction affecting left non-dominant side: Secondary | ICD-10-CM | POA: Diagnosis not present

## 2023-01-13 DIAGNOSIS — M109 Gout, unspecified: Secondary | ICD-10-CM | POA: Diagnosis not present

## 2023-01-13 DIAGNOSIS — I083 Combined rheumatic disorders of mitral, aortic and tricuspid valves: Secondary | ICD-10-CM | POA: Diagnosis not present

## 2023-01-13 LAB — C-REACTIVE PROTEIN: CRP: 4.9 mg/L (ref <8.0)

## 2023-01-13 LAB — SEDIMENTATION RATE: Sed Rate: 19 mm/h (ref 0–30)

## 2023-01-14 DIAGNOSIS — E785 Hyperlipidemia, unspecified: Secondary | ICD-10-CM | POA: Diagnosis not present

## 2023-01-14 DIAGNOSIS — M25511 Pain in right shoulder: Secondary | ICD-10-CM | POA: Diagnosis not present

## 2023-01-14 DIAGNOSIS — I270 Primary pulmonary hypertension: Secondary | ICD-10-CM | POA: Diagnosis not present

## 2023-01-14 DIAGNOSIS — L89622 Pressure ulcer of left heel, stage 2: Secondary | ICD-10-CM | POA: Diagnosis not present

## 2023-01-14 DIAGNOSIS — N1832 Chronic kidney disease, stage 3b: Secondary | ICD-10-CM | POA: Diagnosis not present

## 2023-01-14 DIAGNOSIS — I69354 Hemiplegia and hemiparesis following cerebral infarction affecting left non-dominant side: Secondary | ICD-10-CM | POA: Diagnosis not present

## 2023-01-14 DIAGNOSIS — L89612 Pressure ulcer of right heel, stage 2: Secondary | ICD-10-CM | POA: Diagnosis not present

## 2023-01-14 DIAGNOSIS — H3530 Unspecified macular degeneration: Secondary | ICD-10-CM | POA: Diagnosis not present

## 2023-01-14 DIAGNOSIS — I872 Venous insufficiency (chronic) (peripheral): Secondary | ICD-10-CM | POA: Diagnosis not present

## 2023-01-14 DIAGNOSIS — G8929 Other chronic pain: Secondary | ICD-10-CM | POA: Diagnosis not present

## 2023-01-14 DIAGNOSIS — M109 Gout, unspecified: Secondary | ICD-10-CM | POA: Diagnosis not present

## 2023-01-14 DIAGNOSIS — F32A Depression, unspecified: Secondary | ICD-10-CM | POA: Diagnosis not present

## 2023-01-14 DIAGNOSIS — D509 Iron deficiency anemia, unspecified: Secondary | ICD-10-CM | POA: Diagnosis not present

## 2023-01-14 DIAGNOSIS — I130 Hypertensive heart and chronic kidney disease with heart failure and stage 1 through stage 4 chronic kidney disease, or unspecified chronic kidney disease: Secondary | ICD-10-CM | POA: Diagnosis not present

## 2023-01-14 DIAGNOSIS — I2510 Atherosclerotic heart disease of native coronary artery without angina pectoris: Secondary | ICD-10-CM | POA: Diagnosis not present

## 2023-01-14 DIAGNOSIS — J9601 Acute respiratory failure with hypoxia: Secondary | ICD-10-CM | POA: Diagnosis not present

## 2023-01-14 DIAGNOSIS — M1711 Unilateral primary osteoarthritis, right knee: Secondary | ICD-10-CM | POA: Diagnosis not present

## 2023-01-14 DIAGNOSIS — H35039 Hypertensive retinopathy, unspecified eye: Secondary | ICD-10-CM | POA: Diagnosis not present

## 2023-01-14 DIAGNOSIS — E43 Unspecified severe protein-calorie malnutrition: Secondary | ICD-10-CM | POA: Diagnosis not present

## 2023-01-14 DIAGNOSIS — L97412 Non-pressure chronic ulcer of right heel and midfoot with fat layer exposed: Secondary | ICD-10-CM | POA: Diagnosis not present

## 2023-01-14 DIAGNOSIS — H409 Unspecified glaucoma: Secondary | ICD-10-CM | POA: Diagnosis not present

## 2023-01-14 DIAGNOSIS — I083 Combined rheumatic disorders of mitral, aortic and tricuspid valves: Secondary | ICD-10-CM | POA: Diagnosis not present

## 2023-01-14 DIAGNOSIS — H547 Unspecified visual loss: Secondary | ICD-10-CM | POA: Diagnosis not present

## 2023-01-14 DIAGNOSIS — I5033 Acute on chronic diastolic (congestive) heart failure: Secondary | ICD-10-CM | POA: Diagnosis not present

## 2023-01-14 DIAGNOSIS — E538 Deficiency of other specified B group vitamins: Secondary | ICD-10-CM | POA: Diagnosis not present

## 2023-01-16 DIAGNOSIS — I2510 Atherosclerotic heart disease of native coronary artery without angina pectoris: Secondary | ICD-10-CM | POA: Diagnosis not present

## 2023-01-16 DIAGNOSIS — M109 Gout, unspecified: Secondary | ICD-10-CM | POA: Diagnosis not present

## 2023-01-16 DIAGNOSIS — H547 Unspecified visual loss: Secondary | ICD-10-CM | POA: Diagnosis not present

## 2023-01-16 DIAGNOSIS — I872 Venous insufficiency (chronic) (peripheral): Secondary | ICD-10-CM | POA: Diagnosis not present

## 2023-01-16 DIAGNOSIS — M25511 Pain in right shoulder: Secondary | ICD-10-CM | POA: Diagnosis not present

## 2023-01-16 DIAGNOSIS — F32A Depression, unspecified: Secondary | ICD-10-CM | POA: Diagnosis not present

## 2023-01-16 DIAGNOSIS — M1711 Unilateral primary osteoarthritis, right knee: Secondary | ICD-10-CM | POA: Diagnosis not present

## 2023-01-16 DIAGNOSIS — I083 Combined rheumatic disorders of mitral, aortic and tricuspid valves: Secondary | ICD-10-CM | POA: Diagnosis not present

## 2023-01-16 DIAGNOSIS — D509 Iron deficiency anemia, unspecified: Secondary | ICD-10-CM | POA: Diagnosis not present

## 2023-01-16 DIAGNOSIS — E785 Hyperlipidemia, unspecified: Secondary | ICD-10-CM | POA: Diagnosis not present

## 2023-01-16 DIAGNOSIS — I69354 Hemiplegia and hemiparesis following cerebral infarction affecting left non-dominant side: Secondary | ICD-10-CM | POA: Diagnosis not present

## 2023-01-16 DIAGNOSIS — H409 Unspecified glaucoma: Secondary | ICD-10-CM | POA: Diagnosis not present

## 2023-01-16 DIAGNOSIS — L89622 Pressure ulcer of left heel, stage 2: Secondary | ICD-10-CM | POA: Diagnosis not present

## 2023-01-16 DIAGNOSIS — E538 Deficiency of other specified B group vitamins: Secondary | ICD-10-CM | POA: Diagnosis not present

## 2023-01-16 DIAGNOSIS — E43 Unspecified severe protein-calorie malnutrition: Secondary | ICD-10-CM | POA: Diagnosis not present

## 2023-01-16 DIAGNOSIS — L97412 Non-pressure chronic ulcer of right heel and midfoot with fat layer exposed: Secondary | ICD-10-CM | POA: Diagnosis not present

## 2023-01-16 DIAGNOSIS — I5033 Acute on chronic diastolic (congestive) heart failure: Secondary | ICD-10-CM | POA: Diagnosis not present

## 2023-01-16 DIAGNOSIS — I270 Primary pulmonary hypertension: Secondary | ICD-10-CM | POA: Diagnosis not present

## 2023-01-16 DIAGNOSIS — G8929 Other chronic pain: Secondary | ICD-10-CM | POA: Diagnosis not present

## 2023-01-16 DIAGNOSIS — H35039 Hypertensive retinopathy, unspecified eye: Secondary | ICD-10-CM | POA: Diagnosis not present

## 2023-01-16 DIAGNOSIS — L89612 Pressure ulcer of right heel, stage 2: Secondary | ICD-10-CM | POA: Diagnosis not present

## 2023-01-16 DIAGNOSIS — J9601 Acute respiratory failure with hypoxia: Secondary | ICD-10-CM | POA: Diagnosis not present

## 2023-01-16 DIAGNOSIS — H3530 Unspecified macular degeneration: Secondary | ICD-10-CM | POA: Diagnosis not present

## 2023-01-16 DIAGNOSIS — I130 Hypertensive heart and chronic kidney disease with heart failure and stage 1 through stage 4 chronic kidney disease, or unspecified chronic kidney disease: Secondary | ICD-10-CM | POA: Diagnosis not present

## 2023-01-16 DIAGNOSIS — N1832 Chronic kidney disease, stage 3b: Secondary | ICD-10-CM | POA: Diagnosis not present

## 2023-01-17 ENCOUNTER — Encounter (HOSPITAL_BASED_OUTPATIENT_CLINIC_OR_DEPARTMENT_OTHER): Payer: Medicare Other | Attending: Internal Medicine | Admitting: Internal Medicine

## 2023-01-17 DIAGNOSIS — S81801A Unspecified open wound, right lower leg, initial encounter: Secondary | ICD-10-CM | POA: Insufficient documentation

## 2023-01-17 DIAGNOSIS — S91301A Unspecified open wound, right foot, initial encounter: Secondary | ICD-10-CM | POA: Diagnosis not present

## 2023-01-17 DIAGNOSIS — N183 Chronic kidney disease, stage 3 unspecified: Secondary | ICD-10-CM | POA: Insufficient documentation

## 2023-01-17 DIAGNOSIS — Z8673 Personal history of transient ischemic attack (TIA), and cerebral infarction without residual deficits: Secondary | ICD-10-CM | POA: Diagnosis not present

## 2023-01-17 DIAGNOSIS — L89613 Pressure ulcer of right heel, stage 3: Secondary | ICD-10-CM | POA: Insufficient documentation

## 2023-01-17 DIAGNOSIS — L89623 Pressure ulcer of left heel, stage 3: Secondary | ICD-10-CM | POA: Diagnosis not present

## 2023-01-17 DIAGNOSIS — L97822 Non-pressure chronic ulcer of other part of left lower leg with fat layer exposed: Secondary | ICD-10-CM | POA: Diagnosis not present

## 2023-01-17 DIAGNOSIS — I87312 Chronic venous hypertension (idiopathic) with ulcer of left lower extremity: Secondary | ICD-10-CM | POA: Insufficient documentation

## 2023-01-17 DIAGNOSIS — I130 Hypertensive heart and chronic kidney disease with heart failure and stage 1 through stage 4 chronic kidney disease, or unspecified chronic kidney disease: Secondary | ICD-10-CM | POA: Insufficient documentation

## 2023-01-17 DIAGNOSIS — W19XXXA Unspecified fall, initial encounter: Secondary | ICD-10-CM | POA: Insufficient documentation

## 2023-01-17 DIAGNOSIS — S50311A Abrasion of right elbow, initial encounter: Secondary | ICD-10-CM | POA: Diagnosis not present

## 2023-01-17 DIAGNOSIS — I5032 Chronic diastolic (congestive) heart failure: Secondary | ICD-10-CM | POA: Diagnosis not present

## 2023-01-17 DIAGNOSIS — L89894 Pressure ulcer of other site, stage 4: Secondary | ICD-10-CM | POA: Insufficient documentation

## 2023-01-17 DIAGNOSIS — M86671 Other chronic osteomyelitis, right ankle and foot: Secondary | ICD-10-CM | POA: Insufficient documentation

## 2023-01-17 NOTE — Progress Notes (Signed)
LUTISHA, DAFFRON ([id number]) [id number] Page 1 of 1 Visit Report for 01/17/2023 Fall Risk Assessment Details Patient Name: Date of Service: [facility] [place], [place] 01/17/2023 9:45 A M Medical Record Number: [id number] Patient Account Number: [id number] Date of Birth/Sex: Treating RN: 1922-12-31 (89 y.o. Orville Govern Primary Care Sanaiya Welliver: Irena Reichmann Other Clinician: Referring Devin Foskey: Treating Welcome Fults/Extender: Jenne Pane in Treatment: 3 Fall Risk Assessment Items Have you had 2 or more falls in the last 12 monthso 0 Yes Have you had any fall that resulted in injury in the last 12 monthso 0 Yes FALLS RISK SCREEN History of falling - immediate or within 3 months 25 Yes Secondary diagnosis (Do you have 2 or more medical diagnoseso) 15 Yes Ambulatory aid None/bed rest/wheelchair/nurse 0 No Crutches/cane/walker 0 No Furniture 0 No Intravenous therapy Access/Saline/Heparin Lock 0 No Gait/Transferring Normal/ bed rest/ wheelchair 0 No Weak (short steps with or without shuffle, stooped but able to lift head while walking, may seek 0 No support from furniture) Impaired (short steps with shuffle, may have difficulty arising from chair, head down, impaired 20 Yes balance) Mental Status Oriented to own ability 0 Yes Electronic Signature(s) Signed: 01/17/2023 3:40:23 PM By: Redmond Pulling RN, BSN Entered By: Redmond Pulling on 01/17/2023 10:48:39

## 2023-01-17 NOTE — Progress Notes (Signed)
[name] ([id number]) [id number] Page 1 of 9 Visit Report for 01/17/2023 Chief Complaint Document Details Patient Name: Date of Service: [facility] [place], [place] 01/17/2023 9:45 A M Medical Record Number: [id number] Patient Account Number: [id number] Date of Birth/Sex: Treating RN: 11-19-1922 (89 y.o. F) Primary Care Provider: Irena Reichmann Other Clinician: Referring Provider: Treating Provider/Extender: Jenne Pane in Treatment: 3 Information Obtained from: Patient Chief Complaint 12/21/2022; bilateral heel wounds, right medial foot wound and left posterior leg wound Electronic Signature(s) Signed: 01/17/2023 12:40:48 PM By: Geralyn Corwin DO Entered By: Geralyn Corwin on 01/17/2023 11:38:54 -------------------------------------------------------------------------------- HPI Details Patient Name: Date of Service: Paula Jock, [place] [name] 01/17/2023 9:45 A M Medical Record Number: [id number] Patient Account Number: [id number] Date of Birth/Sex: Treating RN: 10/15/1922 (89 y.o. F) Primary Care Provider: Irena Reichmann Other Clinician: Referring Provider: Treating Provider/Extender: Jenne Pane in Treatment: 3 History of Present Illness HPI Description: 12/21/2022 Ms. [name] is a 89 year old female with a past medical history of CVA, CAD, and chronic diastolic heart failure that presents to the clinic for a 1 month history of nonhealing ulcers to her feet and a 1 week history of wound to her left leg. She states that she was in the hospital for heart failure in January and developed pressure ulcers to her feet and heels. She has been following with podiatry for this issue. They have been using Aquacel Ag to the wound beds. She has also been having her legs wrapped in Unna boots (by her PCP) to help with swelling however she does not have wounds to her legs that are being treated with the wrap. The Unna boot wrap  started cutting into the back of her left leg and now she has [name] area open. She currently denies signs of infection. She has been using Prevalon boots to her feet bilaterally. 3/19; patient presents for follow-up. She had a bone biopsy done at last clinic visit that was positive for osteomyelitis. She had a culture done as well that showed Staphylococcus lugdunensis and Stenotrophomonas maltophilia. Patient been using Hydrofera Blue to the wound beds. Overall she denies any signs of infection. She is being referred to infectious disease. 4/9; patient presents for follow-up. She saw Dr. Earlene Plater with infectious disease on 4/4 and was started on minocycline to complete 6 weeks for chronic osteomyelitis of the right foot. Unfortunately she fell about 1 week ago and developed abrasions to her left anterior leg and right elbow. She has been using Hydrofera Blue to all wound sites. Electronic Signature(s) Signed: 01/17/2023 12:40:48 PM By: Geralyn Corwin DO Entered By: Geralyn Corwin on 01/17/2023 11:39:36 -------------------------------------------------------------------------------- Physical Exam Details Patient Name: Date of Service: [name], [name] [name] 01/17/2023 9:45 A M Medical Record Number: [id number] Patient Account Number: [id number] Date of Birth/Sex: Treating RN: 1923/03/16 (89 y.o. F) Primary Care Provider: Irena Reichmann Other Clinician: Referring Provider: Treating Provider/Extender: Jenne Pane in Treatment: 3 Constitutional respirations regular, non-labored and within target range for patient.Marland Kitchen [name], [name] ([id number]) [id number] Page 2 of 9 Cardiovascular 2+ dorsalis pedis/posterior tibialis pulses. Psychiatric pleasant and cooperative. Notes Right heel wound with granulation tissue at the opening. T the left heel epithelialization to the previous wound. T the right medial foot there is a large open o o wound With  granulation tissue and filled with nonviable tissue. No exposed bone. T the left posterior leg there is [name] area of skin breakdown. Skin breakdown to o  the right elbow. Open wound to the left anterior leg with nonviable tissue and granulation tissue. No signs of acute soft tissue infection to any of the wound beds. Electronic Signature(s) Signed: 01/17/2023 12:40:48 PM By: Geralyn Corwin DO Entered By: Geralyn Corwin on 01/17/2023 11:47:53 -------------------------------------------------------------------------------- Physician Orders Details Patient Name: Date of Service: [facility], [place] [name] 01/17/2023 9:45 A M Medical Record Number: [id number] Patient Account Number: [id number] Date of Birth/Sex: Treating RN: Sep 24, 1923 (89 y.o. [place] Govern Primary Care Provider: Irena Reichmann Other Clinician: Referring Provider: Treating Provider/Extender: Jenne Pane in Treatment: 3 Verbal / Phone Orders: No Diagnosis Coding ICD-10 Coding Code Description [phone] Pressure ulcer of right heel, stage 3 L89.623 Pressure ulcer of left heel, stage 3 L97.822 Non-pressure chronic ulcer of other part of left lower leg with fat layer exposed I87.312 Chronic venous hypertension (idiopathic) with ulcer of left lower extremity L89.894 Pressure ulcer of other site, stage 4 S91.301A Unspecified open wound, right foot, initial encounter M86.671 Other chronic osteomyelitis, right ankle and foot Follow-up Appointments ppointment in 2 weeks. - w/ Dr. Mikey Bussing and Lyla Son rm # 7 Tuesday 01/31/23 @ 0900 Return A Anesthetic (In clinic) Topical Lidocaine 5% applied to wound bed (In clinic) Topical Lidocaine 4% applied to wound bed Bathing/ Shower/ Hygiene May shower and wash wound with soap and water. Edema Control - Lymphedema / SCD / Other Compression stocking or Garment 20-30 mm/Hg pressure to: - compression stockings both legs daily or tubigrip Off-Loading Other: - heel protectors  both heels especially while in bed Home Health New wound care orders this week; continue Home Health for wound care. May utilize formulary equivalent dressing for wound treatment orders unless otherwise specified. Other Home Health Orders/Instructions: - [facility] Wound Treatment Wound #1 - Metatarsal head first Wound Laterality: Right, Medial Prim Dressing: Hydrofera Blue Ready Transfer Foam, 2.5x2.5 (in/in) 1 x Per Day/30 Days ary Discharge Instructions: Apply directly to wound bed as directed Secondary Dressing: Woven Gauze Sponge, Non-Sterile 4x4 in 1 x Per Day/30 Days Discharge Instructions: Apply over primary dressing as directed. Secured With: [employer], 4.5x3.1 (in/yd) 1 x Per Day/30 Days Discharge Instructions: Secure with Kerlix as directed. Compression Wrap: tubigrip 1 x Per Day/30 Days RYANNA, TESCHNER ([id number]) [id number] Page 3 of 9 Discharge Instructions: apply in morning and may remove at night Wound #2 - Calcaneus Wound Laterality: Right Prim Dressing: Hydrofera Blue Ready Transfer Foam, 2.5x2.5 (in/in) 3 x Per Week/30 Days ary Discharge Instructions: Apply directly to wound bed as directed Secondary Dressing: Zetuvit Plus Silicone Border Dressing 4x4 (in/in) 3 x Per Week/30 Days Discharge Instructions: Apply silicone border over primary dressing as directed. Wound #5 - Lower Leg Wound Laterality: Left, Anterior Prim Dressing: Hydrofera Blue Ready Transfer Foam, 2.5x2.5 (in/in) 3 x Per Week/30 Days ary Discharge Instructions: Apply directly to wound bed as directed Secondary Dressing: Zetuvit Plus Silicone Border Dressing 4x4 (in/in) 3 x Per Week/30 Days Discharge Instructions: Apply silicone border over primary dressing as directed. Wound #6 - Elbow Wound Laterality: Right Cleanser: Soap and Water 1 x Per Day/15 Days Discharge Instructions: May shower and wash wound with dial antibacterial soap and water prior to dressing  change. Topical: Neosporin or Bacitracin 1 x Per Day/15 Days Secondary Dressing: Zetuvit Plus Silicone Border Dressing 5x5 (in/in) 1 x Per Day/15 Days Discharge Instructions: Apply silicone border over primary dressing as directed. Patient Medications llergies: meloxicam, nitrofurantoin, guaifenesin, nitrofuran derivative A Notifications Medication Indication Start End prior to debridement 01/17/2023  lidocaine DOSE topical 5 % ointment - ointment topical once daily Electronic Signature(s) Signed: 01/17/2023 12:40:48 PM By: Geralyn Corwin DO Entered By: Geralyn Corwin on 01/17/2023 11:48:04 -------------------------------------------------------------------------------- Problem List Details Patient Name: Date of Service: [facility], [place] [name] 01/17/2023 9:45 A M Medical Record Number: [id number] Patient Account Number: [id number] Date of Birth/Sex: Treating RN: June 27, 1923 (89 y.o. F) Primary Care Provider: Irena Reichmann Other Clinician: Referring Provider: Treating Provider/Extender: Jenne Pane in Treatment: 3 Active Problems ICD-10 Encounter Code Description Active Date MDM Diagnosis L89.613 Pressure ulcer of right heel, stage 3 12/21/2022 No Yes L89.623 Pressure ulcer of left heel, stage 3 12/21/2022 No Yes L97.822 Non-pressure chronic ulcer of other part of left lower leg with fat layer exposed3/13/2024 No Yes I87.312 Chronic venous hypertension (idiopathic) with ulcer of left lower extremity 12/21/2022 No Yes [name], [name] ([id number]) [id number] Page 4 of 9 [phone] Pressure ulcer of other site, stage 4 12/21/2022 No Yes S91.301A Unspecified open wound, right foot, initial encounter 12/21/2022 No Yes M86.671 Other chronic osteomyelitis, right ankle and foot 12/27/2022 No Yes S50.311A Abrasion of right elbow, initial encounter 01/17/2023 No Yes S81.801A Unspecified open wound, right lower leg, initial encounter 01/17/2023 No Yes Inactive  Problems Resolved Problems Electronic Signature(s) Signed: 01/17/2023 12:40:48 PM By: Geralyn Corwin DO Entered By: Geralyn Corwin on 01/17/2023 11:38:41 -------------------------------------------------------------------------------- Progress Note Details Patient Name: Date of Service: Paula Jock, [place] [name] 01/17/2023 9:45 A M Medical Record Number: [id number] Patient Account Number: [id number] Date of Birth/Sex: Treating RN: 1923-09-24 (89 y.o. F) Primary Care Provider: Irena Reichmann Other Clinician: Referring Provider: Treating Provider/Extender: Jenne Pane in Treatment: 3 Subjective Chief Complaint Information obtained from Patient 12/21/2022; bilateral heel wounds, right medial foot wound and left posterior leg wound History of Present Illness (HPI) 12/21/2022 Ms. [name] is a 89 year old female with a past medical history of CVA, CAD, and chronic diastolic heart failure that presents to the clinic for a 1 month history of nonhealing ulcers to her feet and a 1 week history of wound to her left leg. She states that she was in the hospital for heart failure in January and developed pressure ulcers to her feet and heels. She has been following with podiatry for this issue. They have been using Aquacel Ag to the wound beds. She has also been having her legs wrapped in Unna boots (by her PCP) to help with swelling however she does not have wounds to her legs that are being treated with the wrap. The Unna boot wrap started cutting into the back of her left leg and now she has [name] area open. She currently denies signs of infection. She has been using Prevalon boots to her feet bilaterally. 3/19; patient presents for follow-up. She had a bone biopsy done at last clinic visit that was positive for osteomyelitis. She had a culture done as well that showed Staphylococcus lugdunensis and Stenotrophomonas maltophilia. Patient been using Hydrofera Blue to the wound beds.  Overall she denies any signs of infection. She is being referred to infectious disease. 4/9; patient presents for follow-up. She saw Dr. Earlene Plater with infectious disease on 4/4 and was started on minocycline to complete 6 weeks for chronic osteomyelitis of the right foot. Unfortunately she fell about 1 week ago and developed abrasions to her left anterior leg and right elbow. She has been using Hydrofera Blue to all wound sites. Patient History Information obtained from Patient, Chart. Family History Heart Disease - Mother,  Stroke - Mother. Social History Never smoker, Marital Status - Divorced, Alcohol Use - Never, Drug Use - No History, Caffeine Use - Rarely. Medical History Eyes Patient has history of Cataracts - bil extractions, Glaucoma Hematologic/Lymphatic Patient has history of Anemia Cardiovascular Addison BaileyBASHAM, [name] ([id number]) [id number] Page 5 of 9 Patient has history of Congestive Heart Failure, Coronary Artery Disease, Hypertension Genitourinary Denies history of End Stage Renal Disease Integumentary (Skin) Denies history of History of Burn Musculoskeletal Patient has history of Gout, Osteoarthritis Denies history of Rheumatoid Arthritis, Osteomyelitis Neurologic Denies history of Neuropathy Oncologic Denies history of Received Chemotherapy, Received Radiation Psychiatric Patient has history of Confinement Anxiety Denies history of Anorexia/bulimia Hospitalization/Surgery History - 11/16 NSTEMI. - 11/16 heart caths. - breast surgery. - hysterectomy. - extraction cataract. - left knee replacement. Medical A Surgical History Notes nd Constitutional Symptoms (General Health) CVA TIA Eyes hypertension retinopathy macular degeneration Ear/Nose/Mouth/Throat hard of hearing Gastrointestinal GERD Genitourinary stage III CKD UTIs, incontinence Neurologic CVA Objective Constitutional respirations regular, non-labored and within target  range for patient.. Vitals Time Taken: 10:21 AM, Height: 60 in, Weight: 130 lbs, BMI: 25.4, Temperature: 97.9 F, Pulse: 67 bpm, Respiratory Rate: 18 breaths/min, Blood Pressure: 129/72 mmHg. Cardiovascular 2+ dorsalis pedis/posterior tibialis pulses. Psychiatric pleasant and cooperative. General Notes: Right heel wound with granulation tissue at the opening. T the left heel epithelialization to the previous wound. T the right medial foot there is a o o large open wound With granulation tissue and filled with nonviable tissue. No exposed bone. T the left posterior leg there is [name] area of skin breakdown. Skin o breakdown to the right elbow. Open wound to the left anterior leg with nonviable tissue and granulation tissue. No signs of acute soft tissue infection to any of the wound beds. Integumentary (Hair, Skin) Wound #1 status is Open. Original cause of wound was Gradually Appeared. The date acquired was: 12/07/2022. The wound has been in treatment 3 weeks. The wound is located on the Right,Medial Metatarsal head first. The wound measures 1cm length x 1.4cm width x 0.2cm depth; 1.1cm^2 area and 0.22cm^3 volume. There is bone and Fat Layer (Subcutaneous Tissue) exposed. There is no tunneling or undermining noted. There is a small amount of serosanguineous drainage noted. The wound margin is distinct with the outline attached to the wound base. There is no granulation within the wound bed. There is a medium (34-66%) amount of necrotic tissue within the wound bed including Adherent Slough. The periwound skin appearance exhibited: Callus, Hemosiderin Staining. The periwound skin appearance did not exhibit: Dry/Scaly, Maceration. Periwound temperature was noted as Cool/Cold. The periwound has tenderness on palpation. Wound #2 status is Open. Original cause of wound was Pressure Injury. The date acquired was: 10/24/2022. The wound has been in treatment 3 weeks. The wound is located on the Right  Calcaneus. The wound measures 0.3cm length x 0.3cm width x 0.1cm depth; 0.071cm^2 area and 0.007cm^3 volume. There is Fat Layer (Subcutaneous Tissue) exposed. There is no tunneling or undermining noted. There is a medium amount of serous drainage noted. The wound margin is distinct with the outline attached to the wound base. There is no granulation within the wound bed. There is a large (67-100%) amount of necrotic tissue within the wound bed including Adherent Slough. The periwound skin appearance had no abnormalities noted for texture. The periwound skin appearance had no abnormalities noted for color. The periwound skin appearance exhibited: Dry/Scaly. The periwound skin appearance did not exhibit: Maceration. Periwound temperature was noted as  No Abnormality. The periwound has tenderness on palpation. Wound #3 status is Healed - Epithelialized. Original cause of wound was Pressure Injury. The date acquired was: 12/21/2022. The wound has been in treatment 3 weeks. The wound is located on the Left,Posterior Lower Leg. The wound measures 0cm length x 0cm width x 0cm depth; 0cm^2 area and 0cm^3 volume. There is no tunneling or undermining noted. There is a none present amount of drainage noted. The wound margin is flat and intact. There is no granulation within the wound bed. There is no necrotic tissue within the wound bed. The periwound skin appearance had no abnormalities noted for texture. The periwound skin appearance had no abnormalities noted for moisture. The periwound skin appearance exhibited: Erythema. The surrounding wound skin color is noted with erythema. Periwound temperature was noted as No Abnormality. The periwound has tenderness on palpation. Wound #5 status is Open. Original cause of wound was Trauma. The date acquired was: 01/06/2023. The wound is located on the Left,Anterior Lower Leg. The wound measures 5cm length x 2cm width x 0.2cm depth; 7.854cm^2 area and 1.571cm^3 volume.  There is Fat Layer (Subcutaneous Tissue) exposed. There is no tunneling or undermining noted. There is a medium amount of serosanguineous drainage noted. There is medium (34-66%) red granulation within the wound bed. There is a medium (34-66%) amount of necrotic tissue within the wound bed including Eschar and Adherent Slough. [name], [name] ([id number]) [id number] Page 6 of 9 Wound #6 status is Open. Original cause of wound was Trauma. The date acquired was: 01/06/2023. The wound is located on the Right Elbow. The wound measures 4cm length x 3cm width x 0.1cm depth; 9.425cm^2 area and 0.942cm^3 volume. There is Fat Layer (Subcutaneous Tissue) exposed. There is no tunneling or undermining noted. There is a medium amount of serosanguineous drainage noted. There is no granulation within the wound bed. There is a large (67-100%) amount of necrotic tissue within the wound bed including Adherent Slough. Assessment Active Problems ICD-10 Pressure ulcer of right heel, stage 3 Pressure ulcer of left heel, stage 3 Non-pressure chronic ulcer of other part of left lower leg with fat layer exposed Chronic venous hypertension (idiopathic) with ulcer of left lower extremity Pressure ulcer of other site, stage 4 Unspecified open wound, right foot, initial encounter Other chronic osteomyelitis, right ankle and foot Abrasion of right elbow, initial encounter Unspecified open wound, right lower leg, initial encounter Patient's wounds have improved in size in appearance since last clinic visit. Unfortunately she has developed 2 new wounds 1 to her right elbow and 1 to the anterior left leg. I recommended Hydrofera Blue to all wound beds except the right elbow and she can use antibiotic ointment here. Continue antibiotics per infectious disease. Follow-up in 2 weeks. Plan Follow-up Appointments: Return Appointment in 2 weeks. - w/ Dr. Mikey Bussing and Lyla Son rm # 7 Tuesday 01/31/23 @  0900 Anesthetic: (In clinic) Topical Lidocaine 5% applied to wound bed (In clinic) Topical Lidocaine 4% applied to wound bed Bathing/ Shower/ Hygiene: May shower and wash wound with soap and water. Edema Control - Lymphedema / SCD / Other: Compression stocking or Garment 20-30 mm/Hg pressure to: - compression stockings both legs daily or tubigrip Off-Loading: Other: - heel protectors both heels especially while in bed Home Health: New wound care orders this week; continue Home Health for wound care. May utilize formulary equivalent dressing for wound treatment orders unless otherwise specified. Other Home Health Orders/Instructions: - [facility] The following medication(s) was prescribed: lidocaine topical  5 % ointment ointment topical once daily for prior to debridement was prescribed at facility WOUND #1: - Metatarsal head first Wound Laterality: Right, Medial Prim Dressing: Hydrofera Blue Ready Transfer Foam, 2.5x2.5 (in/in) 1 x Per Day/30 Days ary Discharge Instructions: Apply directly to wound bed as directed Secondary Dressing: Woven Gauze Sponge, Non-Sterile 4x4 in 1 x Per Day/30 Days Discharge Instructions: Apply over primary dressing as directed. Secured With: [employer], 4.5x3.1 (in/yd) 1 x Per Day/30 Days Discharge Instructions: Secure with Kerlix as directed. Com pression Wrap: tubigrip 1 x Per Day/30 Days Discharge Instructions: apply in morning and may remove at night WOUND #2: - Calcaneus Wound Laterality: Right Prim Dressing: Hydrofera Blue Ready Transfer Foam, 2.5x2.5 (in/in) 3 x Per Week/30 Days ary Discharge Instructions: Apply directly to wound bed as directed Secondary Dressing: Zetuvit Plus Silicone Border Dressing 4x4 (in/in) 3 x Per Week/30 Days Discharge Instructions: Apply silicone border over primary dressing as directed. WOUND #5: - Lower Leg Wound Laterality: Left, Anterior Prim Dressing: Hydrofera Blue Ready Transfer Foam, 2.5x2.5 (in/in) 3 x Per  Week/30 Days ary Discharge Instructions: Apply directly to wound bed as directed Secondary Dressing: Zetuvit Plus Silicone Border Dressing 4x4 (in/in) 3 x Per Week/30 Days Discharge Instructions: Apply silicone border over primary dressing as directed. WOUND #6: - Elbow Wound Laterality: Right Cleanser: Soap and Water 1 x Per Day/15 Days Discharge Instructions: May shower and wash wound with dial antibacterial soap and water prior to dressing change. Topical: Neosporin or Bacitracin 1 x Per Day/15 Days Secondary Dressing: Zetuvit Plus Silicone Border Dressing 5x5 (in/in) 1 x Per Day/15 Days Discharge Instructions: Apply silicone border over primary dressing as directed. 1. Hydrofera Blue 2. Antibiotic ointment 3. Continue oral antibiotics per ID 4. Follow-up in 2 weeks Electronic Signature(s) Signed: 01/17/2023 12:40:48 PM By: Irine Seal, SignedSalena Saner ([id number]) PM By: Geralyn Corwin DO [place] 01/17/2023 12:40:48 [id number] Page 7 of 9 Entered By: Geralyn Corwin on 01/17/2023 11:49:25 -------------------------------------------------------------------------------- HxROS Details Patient Name: Date of Service: [facility] [place], [place] 01/17/2023 9:45 A M Medical Record Number: [id number] Patient Account Number: [id number] Date of Birth/Sex: Treating RN: [birth year] (89 y.o. F) Primary Care Provider: Irena Reichmann Other Clinician: Referring Provider: Treating Provider/Extender: Jenne Pane in Treatment: 3 Information Obtained From Patient Chart Constitutional Symptoms (General Health) Medical History: Past Medical History Notes: CVA TIA Eyes Medical History: Positive for: Cataracts - bil extractions; Glaucoma Past Medical History Notes: hypertension retinopathy macular degeneration Ear/Nose/Mouth/Throat Medical History: Past Medical History Notes: hard of hearing Hematologic/Lymphatic Medical History: Positive for:  Anemia Cardiovascular Medical History: Positive for: Congestive Heart Failure; Coronary Artery Disease; Hypertension Gastrointestinal Medical History: Past Medical History Notes: GERD Genitourinary Medical History: Negative for: End Stage Renal Disease Past Medical History Notes: stage III CKD UTIs, incontinence Integumentary (Skin) Medical History: Negative for: History of Burn Musculoskeletal Medical History: Positive for: Gout; Osteoarthritis Negative for: Rheumatoid Arthritis; Osteomyelitis Neurologic Medical History: Negative for: Neuropathy Past Medical History Notes: CVA [name], [name] ([id number]) [id number] Page 8 of 9 Oncologic Medical History: Negative for: Received Chemotherapy; Received Radiation Psychiatric Medical History: Positive for: Confinement Anxiety Negative for: Anorexia/bulimia HBO Extended History Items Eyes: Eyes: Cataracts Glaucoma Immunizations Pneumococcal Vaccine: Received Pneumococcal Vaccination: Yes Received Pneumococcal Vaccination On or After 60th Birthday: Yes Implantable Devices No devices added Hospitalization / Surgery History Type of Hospitalization/Surgery 11/16 NSTEMI 11/16 heart caths breast surgery hysterectomy extraction cataract left knee replacement Family and Social History Heart Disease: Yes - Mother;  Stroke: Yes - Mother; Never smoker; Marital Status - Divorced; Alcohol Use: Never; Drug Use: No History; Caffeine Use: Rarely; Financial Concerns: No; Food, Clothing or Shelter Needs: No; Support System Lacking: No; Transportation Concerns: No Electronic Signature(s) Signed: 01/17/2023 12:40:48 PM By: Geralyn Corwin DO Entered By: Geralyn Corwin on 01/17/2023 11:46:44 -------------------------------------------------------------------------------- SuperBill Details Patient Name: Date of Service: [facility], [place] [name] 01/17/2023 Medical Record Number: [id number] Patient Account Number:  [id number] Date of Birth/Sex: Treating RN: 10-Mar-1923 (89 y.o. F) Primary Care Provider: Irena Reichmann Other Clinician: Referring Provider: Treating Provider/Extender: Jenne Pane in Treatment: 3 Diagnosis Coding ICD-10 Codes Code Description [phone] Pressure ulcer of right heel, stage 3 L89.623 Pressure ulcer of left heel, stage 3 L97.822 Non-pressure chronic ulcer of other part of left lower leg with fat layer exposed I87.312 Chronic venous hypertension (idiopathic) with ulcer of left lower extremity L89.894 Pressure ulcer of other site, stage 4 S91.301A Unspecified open wound, right foot, initial encounter M86.671 Other chronic osteomyelitis, right ankle and foot S50.311A Abrasion of right elbow, initial encounter S81.801A Unspecified open wound, right lower leg, initial encounter Physician Procedures : CPT4 Code Description Modifier [id number] - WC PHYS LEVEL 3 - EST PT [name], [name] ([id number]) [id number] ICD-10 Diagnosis Description L89.613 Pressure ulcer of right heel, stage 3 L97.822 Non-pressure chronic ulcer of other  part of left lower leg with fat layer exposed M86.671 Other chronic osteomyelitis, right ankle and foot S50.311A Abrasion of right elbow, initial encounter Quantity: 1 [id number] Page 9 of 9 Electronic Signature(s) Signed: 01/17/2023 12:40:48 PM By: Geralyn Corwin DO Entered By: Geralyn Corwin on 01/17/2023 11:49:54

## 2023-01-17 NOTE — Progress Notes (Addendum)
[name], [name] ([id number]) [id number] Page 1 of 12 Visit Report for 01/17/2023 Arrival Information Details Patient Name: Date of Service: [facility] [place], [place] 01/17/2023 9:45 A M Medical Record Number: [id number] Patient Account Number: [id number] Date of Birth/Sex: Treating RN: [birth year] (89 y.o. Orville Govern Primary Care Penda Venturi: Irena Reichmann Other Clinician: Referring Billi Bright: Treating Gordan Grell/Extender: Jenne Pane in Treatment: 3 Visit Information History Since Last Visit Added or deleted any medications: No Patient Arrived: Wheel Chair Any new allergies or adverse reactions: No Arrival Time: 10:18 Had a fall or experienced change in Yes Accompanied By: granddaughter activities of daily living that may affect Transfer Assistance: Manual risk of falls: Patient Identification Verified: Yes Signs or symptoms of abuse/neglect since last visito No Secondary Verification Process Completed: Yes Hospitalized since last visit: No Patient Requires Transmission-Based Precautions: No Implantable device outside of the clinic excluding No Patient Has Alerts: Yes cellular tissue based products placed in the center Patient Alerts: R and L ABI N/[name] since last visit: Has Dressing in Place as Prescribed: Yes Has Compression in Place as Prescribed: Yes Pain Present Now: Yes Electronic Signature(s) Signed: 01/17/2023 3:40:23 PM By: Redmond Pulling RN, BSN Entered By: Redmond Pulling on 01/17/2023 10:21:54 -------------------------------------------------------------------------------- Clinic Level of Care Assessment Details Patient Name: Date of Service: [facility] [place], [name] [name] 01/17/2023 9:45 A M Medical Record Number: [id number] Patient Account Number: [id number] Date of Birth/Sex: Treating RN: 1922/10/18 (89 y.o. Orville Govern Primary Care Milania Haubner: Irena Reichmann Other Clinician: Referring Phung Kotas: Treating Kenaz Olafson/Extender: Jenne Pane in Treatment: 3 Clinic Level of Care Assessment Items TOOL 4 Quantity Score X- 1 0 Use when only an EandM is performed on FOLLOW-UP visit ASSESSMENTS - Nursing Assessment / Reassessment X- 1 10 Reassessment of Co-morbidities (includes updates in patient status) X- 1 5 Reassessment of Adherence to Treatment Plan ASSESSMENTS - Wound and Skin A ssessment / Reassessment []  - 0 Simple Wound Assessment / Reassessment - one wound X- 5 5 Complex Wound Assessment / Reassessment - multiple wounds []  - 0 Dermatologic / Skin Assessment (not related to wound area) ASSESSMENTS - Focused Assessment X- 2 5 Circumferential Edema Measurements - multi extremities []  - 0 Nutritional Assessment / Counseling / Intervention [name], [name] ([id number]) [id number] Page 2 of 12 []  - 0 Lower Extremity Assessment (monofilament, tuning fork, pulses) []  - 0 Peripheral Arterial Disease Assessment (using hand held doppler) ASSESSMENTS - Ostomy and/or Continence Assessment and Care []  - 0 Incontinence Assessment and Management []  - 0 Ostomy Care Assessment and Management (repouching, etc.) PROCESS - Coordination of Care X - Simple Patient / Family Education for ongoing care 1 15 []  - 0 Complex (extensive) Patient / Family Education for ongoing care X- 1 10 Staff obtains Chiropractor, Records, T Results / Process Orders est X- 1 10 Staff telephones HHA, Nursing Homes / Clarify orders / etc []  - 0 Routine Transfer to another Facility (non-emergent condition) []  - 0 Routine Hospital Admission (non-emergent condition) []  - 0 New Admissions / Manufacturing engineer / Ordering NPWT Apligraf, etc. , []  - 0 Emergency Hospital Admission (emergent condition) []  - 0 Simple Discharge Coordination []  - 0 Complex (extensive) Discharge Coordination PROCESS - Special Needs []  - 0 Pediatric / Minor Patient Management []  - 0 Isolation Patient  Management []  - 0 Hearing / Language / Visual special needs []  - 0 Assessment of Community assistance (transportation, D/[name] planning, etc.) []  - 0 Additional assistance / Altered mentation []  -  0 Support Surface(s) Assessment (bed, cushion, seat, etc.) INTERVENTIONS - Wound Cleansing / Measurement []  - 0 Simple Wound Cleansing - one wound X- 5 5 Complex Wound Cleansing - multiple wounds X- 1 5 Wound Imaging (photographs - any number of wounds) []  - 0 Wound Tracing (instead of photographs) []  - 0 Simple Wound Measurement - one wound X- 5 5 Complex Wound Measurement - multiple wounds INTERVENTIONS - Wound Dressings X - Small Wound Dressing one or multiple wounds 3 10 []  - 0 Medium Wound Dressing one or multiple wounds []  - 0 Large Wound Dressing one or multiple wounds X- 1 5 Application of Medications - topical []  - 0 Application of Medications - injection INTERVENTIONS - Miscellaneous []  - 0 External ear exam []  - 0 Specimen Collection (cultures, biopsies, blood, body fluids, etc.) []  - 0 Specimen(s) / Culture(s) sent or taken to Lab for analysis []  - 0 Patient Transfer (multiple staff / Nurse, adult / Similar devices) []  - 0 Simple Staple / Suture removal (25 or less) []  - 0 Complex Staple / Suture removal (26 or more) []  - 0 Hypo / Hyperglycemic Management (close monitor of Blood Glucose) [name], [name] ([id number]) [id number] Page 3 of 12 []  - 0 Ankle / Brachial Index (ABI) - do not check if billed separately X- 1 5 Vital Signs Has the patient been seen at the hospital within the last three years: Yes Total Score: 180 Level Of Care: New/Established - Level 5 Electronic Signature(s) Signed: 01/17/2023 3:40:23 PM By: Redmond Pulling RN, BSN Entered By: Redmond Pulling on 01/17/2023 12:09:31 -------------------------------------------------------------------------------- Encounter Discharge Information Details Patient Name: Date of  Service: [facility] [place], [name] 01/17/2023 9:45 A M Medical Record Number: [id number] Patient Account Number: [id number] Date of Birth/Sex: Treating RN: 08/04/1923 (89 y.o. Orville Govern Primary Care Orvella Digiulio: Irena Reichmann Other Clinician: Referring Shadee Rathod: Treating Zona Pedro/Extender: Jenne Pane in Treatment: 3 Encounter Discharge Information Items Discharge Condition: Stable Ambulatory Status: Wheelchair Discharge Destination: Home Transportation: Private Auto Accompanied By: granddaughter Schedule Follow-up Appointment: Yes Clinical Summary of Care: Patient Declined Electronic Signature(s) Signed: 01/17/2023 3:40:23 PM By: Redmond Pulling RN, BSN Entered By: Redmond Pulling on 01/17/2023 12:10:44 -------------------------------------------------------------------------------- Lower Extremity Assessment Details Patient Name: Date of Service: [facility] [employer], [name] [name] 01/17/2023 9:45 A M Medical Record Number: [id number] Patient Account Number: [id number] Date of Birth/Sex: Treating RN: [birth year] (89 y.o. Orville Govern Primary Care Emoni Whitworth: Irena Reichmann Other Clinician: Referring Kilani Joffe: Treating Katara Griner/Extender: Luan Moore Weeks in Treatment: 3 Edema Assessment Assessed: [Left: No] [Right: No] Edema: [Left: Yes] [Right: Yes] Calf Left: Right: Point of Measurement: From Medial Instep 29 cm 27.7 cm Ankle Left: Right: Point of Measurement: From Medial Instep 22.5 cm 22 cm Vascular Assessment Left: [[id number] Page 4 of 12Right:] Pulses: Dorsalis Pedis Palpable: [[id number] Page 4 of 12Yes Yes] Electronic Signature(s) Signed: 01/17/2023 3:40:23 PM By: Redmond Pulling RN, BSN Entered By: Redmond Pulling on 01/17/2023 10:33:03 -------------------------------------------------------------------------------- Multi Wound Chart Details Patient Name: Date of Service: [facility] [place], [name]  01/17/2023 9:45 A M Medical Record Number: [id number] Patient Account Number: [id number] Date of Birth/Sex: Treating RN: 11-24-1922 (89 y.o. F) Primary Care Castor Gittleman: Irena Reichmann Other Clinician: Referring Mckenzi Buonomo: Treating Nary Sneed/Extender: Jenne Pane in Treatment: 3 Vital Signs Height(in): 60 Pulse(bpm): 67 Weight(lbs): 130 Blood Pressure(mmHg): 129/72 Body Mass Index(BMI): 25.4 Temperature(F): 97.9 Respiratory Rate(breaths/min): 18 [1:Photos:] Right, Medial Metatarsal head first Right Calcaneus Left, Posterior Lower Leg Wound Location:  Gradually Appeared Pressure Injury Pressure Injury Wounding Event: Pressure Ulcer Pressure Ulcer Venous Leg Ulcer Primary Etiology: Cataracts, Glaucoma, Anemia, Cataracts, Glaucoma, Anemia, Cataracts, Glaucoma, Anemia, Comorbid History: Congestive Heart Failure, Coronary Congestive Heart Failure, Coronary Congestive Heart Failure, Coronary Artery Disease, Hypertension, Gout, Artery Disease, Hypertension, Gout, Artery Disease, Hypertension, Gout, Osteoarthritis, Confinement Anxiety Osteoarthritis, Confinement Anxiety Osteoarthritis, Confinement Anxiety 12/07/2022 10/24/2022 12/21/2022 Date Acquired: 3 3 3  Weeks of Treatment: Open Open Healed - Epithelialized Wound Status: No No No Wound Recurrence: No No No Clustered Wound: 1x1.4x0.2 0.3x0.3x0.1 0x0x0 Measurements L x W x D (cm) 1.1 0.071 0 A (cm) : rea 0.22 0.007 0 Volume (cm) : 17.60% 67.70% 100.00% % Reduction in Area: 45.10% 68.20% 100.00% % Reduction in Volume: Category/Stage IV Category/Stage III Full Thickness Without Exposed Classification: Support Structures Small Medium None Present Exudate A mount: Serosanguineous Serous N/A Exudate Type: red, brown amber N/A Exudate Color: Distinct, outline attached Distinct, outline attached Flat and Intact Wound Margin: None Present (0%) None Present (0%) None Present (0%) Granulation Amount: N/A N/A  N/A Granulation Quality: Medium (34-66%) Large (67-100%) None Present (0%) Necrotic Amount: Adherent Slough Adherent Slough N/A Necrotic Tissue: Fat Layer (Subcutaneous Tissue): Yes Fat Layer (Subcutaneous Tissue): Yes Fascia: No Exposed Structures: Bone: Yes Fascia: No Fat Layer (Subcutaneous Tissue): No Tendon: No Tendon: No Muscle: No Muscle: No Joint: No Joint: No Bone: No Bone: No Medium (34-66%) Small (1-33%) Large (67-100%) EpithelializationLANEKA, [name] ([id number]) [id number] Page 5 of 12 Callus: Yes No Abnormalities Noted No Abnormalities Noted Periwound Skin Texture: Maceration: No Dry/Scaly: Yes No Abnormalities Noted Periwound Skin Moisture: Dry/Scaly: No Maceration: No Hemosiderin Staining: Yes No Abnormalities Noted Erythema: Yes Periwound Skin Color: Cool/Cold No Abnormality No Abnormality Temperature: Yes Yes Yes Tenderness on Palpation: Wound Number: 5 6 N/A Photos: N/A Left, Anterior Lower Leg Right Elbow N/A Wound Location: Trauma Trauma N/A Wounding Event: Skin T ear Skin T ear N/A Primary Etiology: Cataracts, Glaucoma, Anemia, Cataracts, Glaucoma, Anemia, N/A Comorbid History: Congestive Heart Failure, Coronary Congestive Heart Failure, Coronary Artery Disease, Hypertension, Gout, Artery Disease, Hypertension, Gout, Osteoarthritis, Confinement Anxiety Osteoarthritis, Confinement Anxiety 01/06/2023 01/06/2023 N/A Date Acquired: 0 0 N/A Weeks of Treatment: Open Open N/A Wound Status: No No N/A Wound Recurrence: No Yes N/A Clustered Wound: 5x2x0.2 4x3x0.1 N/A Measurements L x W x D (cm) 7.854 9.425 N/A A (cm) : rea 1.571 0.942 N/A Volume (cm) : N/A N/A N/A % Reduction in Area: N/A N/A N/A % Reduction in Volume: Full Thickness Without Exposed Full Thickness Without Exposed N/A Classification: Support Structures Support Structures Medium Medium N/A Exudate A mount: Serosanguineous Serosanguineous  N/A Exudate Type: red, brown red, brown N/A Exudate Color: N/A N/A N/A Wound Margin: Medium (34-66%) None Present (0%) N/A Granulation Amount: Red N/A N/A Granulation Quality: Medium (34-66%) Large (67-100%) N/A Necrotic Amount: Eschar, Adherent Slough Adherent Slough N/A Necrotic Tissue: Fat Layer (Subcutaneous Tissue): Yes Fat Layer (Subcutaneous Tissue): Yes N/A Exposed Structures: Fascia: No Fascia: No Tendon: No Tendon: No Muscle: No Muscle: No Joint: No Joint: No Bone: No Bone: No None None N/A Epithelialization: N/A N/A N/A Temperature: Treatment Notes Electronic Signature(s) Signed: 01/17/2023 12:40:48 PM By: Geralyn Corwin DO Entered By: Geralyn Corwin on 01/17/2023 11:38:46 -------------------------------------------------------------------------------- Multi-Disciplinary Care Plan Details Patient Name: Date of Service: [facility] [place], [name] 01/17/2023 9:45 A M Medical Record Number: [id number] Patient Account Number: [id number] Date of Birth/Sex: Treating RN: Oct 12, 1922 (89 y.o. Orville Govern Primary Care Lauralye Kinn: Irena Reichmann Other Clinician: Referring Pharrah Rottman:  Treating Vera Wishart/Extender: Jenne Pane in Treatment: 3 Multidisciplinary Care Plan reviewed with physician [name], [name] ([id number]) [id number] Page 6 of 12 Active Inactive Electronic Signature(s) Signed: 02/13/2023 3:06:24 PM By: Shawn Stall RN, BSN Signed: 04/11/2023 4:27:58 PM By: Redmond Pulling RN, BSN Previous Signature: 01/17/2023 3:40:23 PM Version By: Redmond Pulling RN, BSN Entered By: Shawn Stall on 02/13/2023 15:06:24 -------------------------------------------------------------------------------- Pain Assessment Details Patient Name: Date of Service: [facility] [place], [name] 01/17/2023 9:45 A M Medical Record Number: [id number] Patient Account Number: [id number] Date of Birth/Sex: Treating RN: 20-Jun-1923 (89 y.o. Orville Govern Primary Care Latorya Bautch: Irena Reichmann Other Clinician: Referring Keaun Schnabel: Treating Kellene Mccleary/Extender: Jenne Pane in Treatment: 3 Active Problems Location of Pain Severity and Description of Pain Patient Has Paino Yes Site Locations Rate the pain. Current Pain Level: 7 Pain Management and Medication Current Pain Management: Electronic Signature(s) Signed: 01/17/2023 3:40:23 PM By: Redmond Pulling RN, BSN Entered By: Redmond Pulling on 01/17/2023 10:22:39 -------------------------------------------------------------------------------- Patient/Caregiver Education Details Patient Name: Date of Service: [name], [place] 4/9/2024andnbsp9:45 A M Medical Record Number: [id number] Patient Account Number: [id number] Date of Birth/Gender: Treating RN: [birth year] (89 y.o. Orville Govern Primary Care Physician: Irena Reichmann Other Clinician: Referring Physician: Treating Physician/Extender: Jenne Pane in Treatment: 3 Education Assessment [name], [name] ([id number]) [id number] Page 7 of 12 Education Provided To: Patient Education Topics Provided Wound/Skin Impairment: Methods: Explain/Verbal Responses: State content correctly Electronic Signature(s) Signed: 01/17/2023 3:40:23 PM By: Redmond Pulling RN, BSN Entered By: Redmond Pulling on 01/17/2023 10:52:43 -------------------------------------------------------------------------------- Wound Assessment Details Patient Name: Date of Service: [name], [name] [name] 01/17/2023 9:45 A M Medical Record Number: [id number] Patient Account Number: [id number] Date of Birth/Sex: Treating RN: 1923/05/18 (89 y.o. Orville Govern Primary Care Holley Wirt: Irena Reichmann Other Clinician: Referring Mirah Nevins: Treating Gertrude Tarbet/Extender: Jenne Pane in Treatment: 3 Wound Status Wound Number: 1 Primary Pressure Ulcer Etiology: Wound Location: Right,  Medial Metatarsal head first Wound Open Wounding Event: Gradually Appeared Status: Date Acquired: 12/07/2022 Comorbid Cataracts, Glaucoma, Anemia, Congestive Heart Failure, Coronary Weeks Of Treatment: 3 History: Artery Disease, Hypertension, Gout, Osteoarthritis, Confinement Clustered Wound: No Anxiety Photos Wound Measurements Length: (cm) 1 Width: (cm) 1.4 Depth: (cm) 0.2 Area: (cm) 1.1 Volume: (cm) 0.22 % Reduction in Area: 17.6% % Reduction in Volume: 45.1% Epithelialization: Medium (34-66%) Tunneling: No Undermining: No Wound Description Classification: Category/Stage IV Wound Margin: Distinct, outline attached Exudate Amount: Small Exudate Type: Serosanguineous Exudate Color: red, brown Foul Odor After Cleansing: No Slough/Fibrino Yes Wound Bed Granulation Amount: None Present (0%) Exposed Structure Necrotic Amount: Medium (34-66%) Fat Layer (Subcutaneous Tissue) Exposed: Yes Necrotic Quality: Adherent Slough Bone ExposedJONI, [name] ([id number]) [id number] Page 8 of 12 Periwound Skin Texture Texture Color No Abnormalities Noted: No No Abnormalities Noted: No Callus: Yes Hemosiderin Staining: Yes Moisture Temperature / Pain No Abnormalities Noted: No Temperature: Cool/Cold Dry / Scaly: No Tenderness on Palpation: Yes Maceration: No Electronic Signature(s) Signed: 01/17/2023 3:40:23 PM By: Redmond Pulling RN, BSN Entered By: Redmond Pulling on 01/17/2023 10:42:30 -------------------------------------------------------------------------------- Wound Assessment Details Patient Name: Date of Service: [name], [name] [name] 01/17/2023 9:45 A M Medical Record Number: [id number] Patient Account Number: [id number] Date of Birth/Sex: Treating RN: Apr 05, 1923 (89 y.o. Orville Govern Primary Care Osmar Howton: Irena Reichmann Other Clinician: Referring Salem Mastrogiovanni: Treating Chenay Nesmith/Extender: Jenne Pane in Treatment:  3 Wound Status Wound Number: 2 Primary Pressure Ulcer Etiology: Wound Location:  Right Calcaneus Wound Open Wounding Event: Pressure Injury Status: Date Acquired: 10/24/2022 Comorbid Cataracts, Glaucoma, Anemia, Congestive Heart Failure, Coronary Weeks Of Treatment: 3 History: Artery Disease, Hypertension, Gout, Osteoarthritis, Confinement Clustered Wound: No Anxiety Photos Wound Measurements Length: (cm) 0.3 Width: (cm) 0.3 Depth: (cm) 0.1 Area: (cm) 0.071 Volume: (cm) 0.007 % Reduction in Area: 67.7% % Reduction in Volume: 68.2% Epithelialization: Small (1-33%) Tunneling: No Undermining: No Wound Description Classification: Category/Stage III Wound Margin: Distinct, outline attached Exudate Amount: Medium Exudate Type: Serous Exudate Color: amber Foul Odor After Cleansing: No Slough/Fibrino Yes Wound Bed Granulation Amount: None Present (0%) Exposed Structure Necrotic Amount: Large (67-100%) Fascia Exposed: No Necrotic Quality: Adherent Slough Fat Layer (Subcutaneous Tissue) Exposed: Yes Tendon Exposed: No Muscle Exposed: No [name], [name] ([id number]) [id number] Page 9 of 12 Joint Exposed: No Bone Exposed: No Periwound Skin Texture Texture Color No Abnormalities Noted: Yes No Abnormalities Noted: Yes Moisture Temperature / Pain No Abnormalities Noted: No Temperature: No Abnormality Dry / Scaly: Yes Tenderness on Palpation: Yes Maceration: No Electronic Signature(s) Signed: 01/17/2023 3:40:23 PM By: Redmond Pulling RN, BSN Entered By: Redmond Pulling on 01/17/2023 10:43:27 -------------------------------------------------------------------------------- Wound Assessment Details Patient Name: Date of Service: [name], [name] [name] 01/17/2023 9:45 A M Medical Record Number: [id number] Patient Account Number: [id number] Date of Birth/Sex: Treating RN: 07-09-1923 (89 y.o. Orville Govern Primary Care Zachary Nole: Irena Reichmann Other  Clinician: Referring Tel Hevia: Treating Athan Casalino/Extender: Jenne Pane in Treatment: 3 Wound Status Wound Number: 3 Primary Venous Leg Ulcer Etiology: Wound Location: Left, Posterior Lower Leg Wound Healed - Epithelialized Wounding Event: Pressure Injury Status: Date Acquired: 12/21/2022 Comorbid Cataracts, Glaucoma, Anemia, Congestive Heart Failure, Coronary Weeks Of Treatment: 3 History: Artery Disease, Hypertension, Gout, Osteoarthritis, Confinement Clustered Wound: No Anxiety Photos Wound Measurements Length: (cm) Width: (cm) Depth: (cm) Area: (cm) Volume: (cm) 0 % Reduction in Area: 100% 0 % Reduction in Volume: 100% 0 Epithelialization: Large (67-100%) 0 Tunneling: No 0 Undermining: No Wound Description Classification: Full Thickness Without Exposed Support Structures Wound Margin: Flat and Intact Exudate Amount: None Present Foul Odor After Cleansing: No Slough/Fibrino No Wound Bed Granulation Amount: None Present (0%) Exposed Structure Necrotic Amount: None Present (0%) Fascia Exposed: No Fat Layer (Subcutaneous Tissue) Exposed: No Tendon Exposed: No Muscle Exposed: No [name], [name] ([id number]) [id number] Page 10 of 12 Joint Exposed: No Bone Exposed: No Periwound Skin Texture Texture Color No Abnormalities Noted: Yes No Abnormalities Noted: No Erythema: Yes Moisture No Abnormalities Noted: Yes Temperature / Pain Temperature: No Abnormality Tenderness on Palpation: Yes Electronic Signature(s) Signed: 01/17/2023 3:40:23 PM By: Redmond Pulling RN, BSN Entered By: Redmond Pulling on 01/17/2023 10:54:09 -------------------------------------------------------------------------------- Wound Assessment Details Patient Name: Date of Service: [name], [name] [name] 01/17/2023 9:45 A M Medical Record Number: [id number] Patient Account Number: [id number] Date of Birth/Sex: Treating RN: [birth year] (89 y.o. Orville Govern Primary Care Damichael Hofman: Irena Reichmann Other Clinician: Referring Keondra Haydu: Treating Jayliana Valencia/Extender: Jenne Pane in Treatment: 3 Wound Status Wound Number: 5 Primary Skin T ear Etiology: Wound Location: Left, Anterior Lower Leg Wound Open Wounding Event: Trauma Status: Date Acquired: 01/06/2023 Comorbid Cataracts, Glaucoma, Anemia, Congestive Heart Failure, Coronary Weeks Of Treatment: 0 History: Artery Disease, Hypertension, Gout, Osteoarthritis, Confinement Clustered Wound: No Anxiety Photos Wound Measurements Length: (cm) 5 Width: (cm) 2 Depth: (cm) 0.2 Area: (cm) 7.854 Volume: (cm) 1.571 % Reduction in Area: % Reduction in Volume: Epithelialization: None Tunneling: No Undermining: No Wound Description Classification: Full Thickness Without  Exposed Suppor Exudate Amount: Medium Exudate Type: Serosanguineous Exudate Color: red, brown t Structures Foul Odor After Cleansing: No Slough/Fibrino Yes Wound Bed Granulation Amount: Medium (34-66%) Exposed Structure Granulation Quality: Red Fascia Exposed: No Necrotic Amount: Medium (34-66%) Fat Layer (Subcutaneous Tissue) Exposed: Yes Necrotic Quality: Eschar, Adherent Slough Tendon Exposed: No [name], [name] ([id number]) [id number] Page 11 of 12 Muscle Exposed: No Joint Exposed: No Bone Exposed: No Periwound Skin Texture Texture Color No Abnormalities Noted: No No Abnormalities Noted: No Moisture No Abnormalities Noted: No Electronic Signature(s) Signed: 01/17/2023 3:40:23 PM By: Redmond Pulling RN, BSN Entered By: Redmond Pulling on 01/17/2023 10:44:57 -------------------------------------------------------------------------------- Wound Assessment Details Patient Name: Date of Service: [name], [name] [name] 01/17/2023 9:45 A M Medical Record Number: [id number] Patient Account Number: [id number] Date of Birth/Sex: Treating RN: 1923/04/18 (89 y.o. Orville Govern Primary Care Kristjan Derner: Irena Reichmann Other Clinician: Referring Jaley Yan: Treating Omir Cooprider/Extender: Jenne Pane in Treatment: 3 Wound Status Wound Number: 6 Primary Skin T ear Etiology: Wound Location: Right Elbow Wound Open Wounding Event: Trauma Status: Date Acquired: 01/06/2023 Comorbid Cataracts, Glaucoma, Anemia, Congestive Heart Failure, Coronary Weeks Of Treatment: 0 History: Artery Disease, Hypertension, Gout, Osteoarthritis, Confinement Clustered Wound: Yes Anxiety Photos Wound Measurements Length: (cm) 4 Width: (cm) 3 Depth: (cm) 0.1 Area: (cm) 9.425 Volume: (cm) 0.942 % Reduction in Area: % Reduction in Volume: Epithelialization: None Tunneling: No Undermining: No Wound Description Classification: Full Thickness Without Exposed Support Exudate Amount: Medium Exudate Type: Serosanguineous Exudate Color: red, brown Structures Foul Odor After Cleansing: No Slough/Fibrino Yes Wound Bed Granulation Amount: None Present (0%) Exposed Structure Necrotic Amount: Large (67-100%) Fascia Exposed: No Necrotic Quality: Adherent Slough Fat Layer (Subcutaneous Tissue) Exposed: Yes Tendon Exposed: No Muscle Exposed: No [name], [name] ([id number]) [id number] Page 12 of 12 Joint Exposed: No Bone Exposed: No Periwound Skin Texture Texture Color No Abnormalities Noted: No No Abnormalities Noted: No Moisture No Abnormalities Noted: No Electronic Signature(s) Signed: 01/17/2023 3:40:23 PM By: Redmond Pulling RN, BSN Entered By: Redmond Pulling on 01/17/2023 10:45:38 -------------------------------------------------------------------------------- Vitals Details Patient Name: Date of Service: [name], [name] 01/17/2023 9:45 A M Medical Record Number: [id number] Patient Account Number: [id number] Date of Birth/Sex: Treating RN: 23-Jun-1923 (89 y.o. Orville Govern Primary Care Sallyann Kinnaird: Irena Reichmann Other  Clinician: Referring Reza Crymes: Treating Naureen Benton/Extender: Jenne Pane in Treatment: 3 Vital Signs Time Taken: 10:21 Temperature (F): 97.9 Height (in): 60 Pulse (bpm): 67 Weight (lbs): 130 Respiratory Rate (breaths/min): 18 Body Mass Index (BMI): 25.4 Blood Pressure (mmHg): 129/72 Reference Range: 80 - 120 mg / dl Electronic Signature(s) Signed: 01/17/2023 3:40:23 PM By: Redmond Pulling RN, BSN Entered By: Redmond Pulling on 01/17/2023 10:22:25

## 2023-01-18 ENCOUNTER — Ambulatory Visit: Payer: Self-pay | Admitting: *Deleted

## 2023-01-18 DIAGNOSIS — H35039 Hypertensive retinopathy, unspecified eye: Secondary | ICD-10-CM | POA: Diagnosis not present

## 2023-01-18 DIAGNOSIS — E538 Deficiency of other specified B group vitamins: Secondary | ICD-10-CM | POA: Diagnosis not present

## 2023-01-18 DIAGNOSIS — J9601 Acute respiratory failure with hypoxia: Secondary | ICD-10-CM | POA: Diagnosis not present

## 2023-01-18 DIAGNOSIS — I130 Hypertensive heart and chronic kidney disease with heart failure and stage 1 through stage 4 chronic kidney disease, or unspecified chronic kidney disease: Secondary | ICD-10-CM | POA: Diagnosis not present

## 2023-01-18 DIAGNOSIS — E43 Unspecified severe protein-calorie malnutrition: Secondary | ICD-10-CM | POA: Diagnosis not present

## 2023-01-18 DIAGNOSIS — M25511 Pain in right shoulder: Secondary | ICD-10-CM | POA: Diagnosis not present

## 2023-01-18 DIAGNOSIS — I5033 Acute on chronic diastolic (congestive) heart failure: Secondary | ICD-10-CM | POA: Diagnosis not present

## 2023-01-18 DIAGNOSIS — N1832 Chronic kidney disease, stage 3b: Secondary | ICD-10-CM | POA: Diagnosis not present

## 2023-01-18 DIAGNOSIS — L89622 Pressure ulcer of left heel, stage 2: Secondary | ICD-10-CM | POA: Diagnosis not present

## 2023-01-18 DIAGNOSIS — H409 Unspecified glaucoma: Secondary | ICD-10-CM | POA: Diagnosis not present

## 2023-01-18 DIAGNOSIS — H3530 Unspecified macular degeneration: Secondary | ICD-10-CM | POA: Diagnosis not present

## 2023-01-18 DIAGNOSIS — I2510 Atherosclerotic heart disease of native coronary artery without angina pectoris: Secondary | ICD-10-CM | POA: Diagnosis not present

## 2023-01-18 DIAGNOSIS — I270 Primary pulmonary hypertension: Secondary | ICD-10-CM | POA: Diagnosis not present

## 2023-01-18 DIAGNOSIS — L97412 Non-pressure chronic ulcer of right heel and midfoot with fat layer exposed: Secondary | ICD-10-CM | POA: Diagnosis not present

## 2023-01-18 DIAGNOSIS — D509 Iron deficiency anemia, unspecified: Secondary | ICD-10-CM | POA: Diagnosis not present

## 2023-01-18 DIAGNOSIS — M109 Gout, unspecified: Secondary | ICD-10-CM | POA: Diagnosis not present

## 2023-01-18 DIAGNOSIS — I083 Combined rheumatic disorders of mitral, aortic and tricuspid valves: Secondary | ICD-10-CM | POA: Diagnosis not present

## 2023-01-18 DIAGNOSIS — F32A Depression, unspecified: Secondary | ICD-10-CM | POA: Diagnosis not present

## 2023-01-18 DIAGNOSIS — G8929 Other chronic pain: Secondary | ICD-10-CM | POA: Diagnosis not present

## 2023-01-18 DIAGNOSIS — I872 Venous insufficiency (chronic) (peripheral): Secondary | ICD-10-CM | POA: Diagnosis not present

## 2023-01-18 DIAGNOSIS — M1711 Unilateral primary osteoarthritis, right knee: Secondary | ICD-10-CM | POA: Diagnosis not present

## 2023-01-18 DIAGNOSIS — E785 Hyperlipidemia, unspecified: Secondary | ICD-10-CM | POA: Diagnosis not present

## 2023-01-18 DIAGNOSIS — I69354 Hemiplegia and hemiparesis following cerebral infarction affecting left non-dominant side: Secondary | ICD-10-CM | POA: Diagnosis not present

## 2023-01-18 DIAGNOSIS — L89612 Pressure ulcer of right heel, stage 2: Secondary | ICD-10-CM | POA: Diagnosis not present

## 2023-01-18 DIAGNOSIS — H547 Unspecified visual loss: Secondary | ICD-10-CM | POA: Diagnosis not present

## 2023-01-18 LAB — BASIC METABOLIC PANEL
BUN/Creatinine Ratio: 45 — ABNORMAL HIGH (ref 12–28)
BUN: 79 mg/dL (ref 10–36)
CO2: 20 mmol/L (ref 20–29)
Calcium: 8.8 mg/dL (ref 8.7–10.3)
Chloride: 105 mmol/L (ref 96–106)
Creatinine, Ser: 1.74 mg/dL — ABNORMAL HIGH (ref 0.57–1.00)
Glucose: 90 mg/dL (ref 70–99)
Potassium: 5.6 mmol/L — ABNORMAL HIGH (ref 3.5–5.2)
Sodium: 139 mmol/L (ref 134–144)
eGFR: 26 mL/min/{1.73_m2} — ABNORMAL LOW (ref >59)

## 2023-01-18 NOTE — Patient Outreach (Addendum)
  Care Coordination   Initial Visit Note   01/18/2023 Name: KHADIJAH CARBARY MRN: [id number] DOB: 28-Apr-1923  Casimer Leek is a 89 y.o. year old female who sees Irena Reichmann, [place] for primary care. I  spoke with daughter, Corene Cornea, by phone to initiate Care Coordination support.  What matters to the patients health and wellness today?  Pt is a 89yo living with family support at home. Recently released from hospital and SNF to home after "edema"/CHF/ weakness with HH for RN, PT and OT (therapy is on hold now due to heel pressure ulcer limiting her ambulation). Does go to Wound Center and was seen by ID and being treated with ABX for infection.    Goals Addressed             This Visit's Progress    Provide Care Coordination support to pt and family to optimize health and well-being       Activities and task to complete in order to accomplish goals.   Call your insurance provider for more information about your Enhanced Benefits (meal delivery, etc) Complete SCAT application being mailed to you- portion for PCP to complete prior to being faxed/submitted Review brochure on [employer] program  Review insurance policy to see if you have Long-term care insurance or a custodial care benefit Continue with wound care provided by [facility] I am mailing to you the community resources discussed to review/consider I have placed a referral to our RN Case Manager who will call you          SDOH assessments and interventions completed:  Yes  SDOH Interventions Today    Flowsheet Row Most Recent Value  SDOH Interventions   Food Insecurity Interventions Intervention Not Indicated  Housing Interventions Intervention Not Indicated  Transportation Interventions SCAT ([employer])  [will provide SCAT info/application to family to consider]  Utilities Interventions Intervention Not Indicated  Financial Strain Interventions Intervention Not Indicated         Care Coordination Interventions:  Yes, provided  Interventions Today    Flowsheet Row Most Recent Value  Chronic Disease   Chronic disease during today's visit Congestive Heart Failure (CHF)  General Interventions   General Interventions Discussed/Reviewed General Interventions Discussed, General Interventions Reviewed, Durable Medical Equipment (DME), Community Resources, Referral to Nurse, Level of Care  Durable Medical Equipment (DME) Wheelchair, Environmental consultant  Level of Care --  [Discussed Adult Day Care and Senior center activities- per daughter, pt would not be interested]  Exercise Interventions   Exercise Discussed/Reviewed Physical Activity  [limited at this time due to pressure ulcer/heel wound being treated- pt primarily NWB and uses wheelchair mostly at this time]  Nutrition Interventions   Nutrition Discussed/Reviewed Nutrition Discussed  [daughters assist with meals- education on healthy choices to prepare would be helpful]  Pharmacy Interventions   Pharmacy Dicussed/Reviewed Medication Adherence, Affording Medications  Safety Interventions   Safety Discussed/Reviewed Safety Discussed  [family with pt 24/7]       Follow up plan: Follow up call scheduled for 02/08/23    Encounter Outcome:  Pt. Visit Completed

## 2023-01-18 NOTE — Patient Instructions (Signed)
Visit Information  Thank you for taking time to visit with me today. Please don't hesitate to contact me if I can be of assistance to you.   Following are the goals we discussed today:   Goals Addressed             This Visit's Progress    Provide Care Coordination support to pt and family to optimize health and well-being       Activities and task to complete in order to accomplish goals.   Call your insurance provider for more information about your Enhanced Benefits (meal delivery, etc) Complete SCAT application being mailed to you- portion for PCP to complete prior to being faxed/submitted Review brochure on [employer] program  Review insurance policy to see if you have Long-term care insurance or a custodial care benefit Continue with wound care provided by [facility] I am mailing to you the community resources discussed to review/consider I have placed a referral to our RN Case Manager who will call you          Our next appointment is by telephone on 02/08/23   Please call the care guide team at [phone] if you need to cancel or reschedule your appointment.   If you are experiencing a Mental Health or Behavioral Health Crisis or need someone to talk to, please call the Suicide and Crisis Lifeline: 988 call 911   The patient verbalized understanding of instructions, educational materials, and care plan provided today and DECLINED offer to receive copy of patient instructions, educational materials, and care plan.   Telephone follow up appointment with care management team member scheduled for: 02/08/23   Reece Levy, MSW, LCSW Clinical Social Worker [facility] [phone]

## 2023-01-20 DIAGNOSIS — E43 Unspecified severe protein-calorie malnutrition: Secondary | ICD-10-CM | POA: Diagnosis not present

## 2023-01-20 DIAGNOSIS — E785 Hyperlipidemia, unspecified: Secondary | ICD-10-CM | POA: Diagnosis not present

## 2023-01-20 DIAGNOSIS — H35039 Hypertensive retinopathy, unspecified eye: Secondary | ICD-10-CM | POA: Diagnosis not present

## 2023-01-20 DIAGNOSIS — E538 Deficiency of other specified B group vitamins: Secondary | ICD-10-CM | POA: Diagnosis not present

## 2023-01-20 DIAGNOSIS — H547 Unspecified visual loss: Secondary | ICD-10-CM | POA: Diagnosis not present

## 2023-01-20 DIAGNOSIS — I130 Hypertensive heart and chronic kidney disease with heart failure and stage 1 through stage 4 chronic kidney disease, or unspecified chronic kidney disease: Secondary | ICD-10-CM | POA: Diagnosis not present

## 2023-01-20 DIAGNOSIS — H409 Unspecified glaucoma: Secondary | ICD-10-CM | POA: Diagnosis not present

## 2023-01-20 DIAGNOSIS — L89622 Pressure ulcer of left heel, stage 2: Secondary | ICD-10-CM | POA: Diagnosis not present

## 2023-01-20 DIAGNOSIS — N1832 Chronic kidney disease, stage 3b: Secondary | ICD-10-CM | POA: Diagnosis not present

## 2023-01-20 DIAGNOSIS — M109 Gout, unspecified: Secondary | ICD-10-CM | POA: Diagnosis not present

## 2023-01-20 DIAGNOSIS — I69354 Hemiplegia and hemiparesis following cerebral infarction affecting left non-dominant side: Secondary | ICD-10-CM | POA: Diagnosis not present

## 2023-01-20 DIAGNOSIS — I5033 Acute on chronic diastolic (congestive) heart failure: Secondary | ICD-10-CM | POA: Diagnosis not present

## 2023-01-20 DIAGNOSIS — I083 Combined rheumatic disorders of mitral, aortic and tricuspid valves: Secondary | ICD-10-CM | POA: Diagnosis not present

## 2023-01-20 DIAGNOSIS — G8929 Other chronic pain: Secondary | ICD-10-CM | POA: Diagnosis not present

## 2023-01-20 DIAGNOSIS — H3530 Unspecified macular degeneration: Secondary | ICD-10-CM | POA: Diagnosis not present

## 2023-01-20 DIAGNOSIS — F32A Depression, unspecified: Secondary | ICD-10-CM | POA: Diagnosis not present

## 2023-01-20 DIAGNOSIS — L89612 Pressure ulcer of right heel, stage 2: Secondary | ICD-10-CM | POA: Diagnosis not present

## 2023-01-20 DIAGNOSIS — I270 Primary pulmonary hypertension: Secondary | ICD-10-CM | POA: Diagnosis not present

## 2023-01-20 DIAGNOSIS — I872 Venous insufficiency (chronic) (peripheral): Secondary | ICD-10-CM | POA: Diagnosis not present

## 2023-01-20 DIAGNOSIS — D509 Iron deficiency anemia, unspecified: Secondary | ICD-10-CM | POA: Diagnosis not present

## 2023-01-20 DIAGNOSIS — I2510 Atherosclerotic heart disease of native coronary artery without angina pectoris: Secondary | ICD-10-CM | POA: Diagnosis not present

## 2023-01-20 DIAGNOSIS — M25511 Pain in right shoulder: Secondary | ICD-10-CM | POA: Diagnosis not present

## 2023-01-20 DIAGNOSIS — M1711 Unilateral primary osteoarthritis, right knee: Secondary | ICD-10-CM | POA: Diagnosis not present

## 2023-01-20 DIAGNOSIS — J9601 Acute respiratory failure with hypoxia: Secondary | ICD-10-CM | POA: Diagnosis not present

## 2023-01-20 DIAGNOSIS — L97412 Non-pressure chronic ulcer of right heel and midfoot with fat layer exposed: Secondary | ICD-10-CM | POA: Diagnosis not present

## 2023-01-23 ENCOUNTER — Ambulatory Visit: Payer: Self-pay

## 2023-01-23 DIAGNOSIS — H409 Unspecified glaucoma: Secondary | ICD-10-CM | POA: Diagnosis not present

## 2023-01-23 DIAGNOSIS — E785 Hyperlipidemia, unspecified: Secondary | ICD-10-CM | POA: Diagnosis not present

## 2023-01-23 DIAGNOSIS — H3530 Unspecified macular degeneration: Secondary | ICD-10-CM | POA: Diagnosis not present

## 2023-01-23 DIAGNOSIS — G8929 Other chronic pain: Secondary | ICD-10-CM | POA: Diagnosis not present

## 2023-01-23 DIAGNOSIS — I083 Combined rheumatic disorders of mitral, aortic and tricuspid valves: Secondary | ICD-10-CM | POA: Diagnosis not present

## 2023-01-23 DIAGNOSIS — E538 Deficiency of other specified B group vitamins: Secondary | ICD-10-CM | POA: Diagnosis not present

## 2023-01-23 DIAGNOSIS — M25511 Pain in right shoulder: Secondary | ICD-10-CM | POA: Diagnosis not present

## 2023-01-23 DIAGNOSIS — H35039 Hypertensive retinopathy, unspecified eye: Secondary | ICD-10-CM | POA: Diagnosis not present

## 2023-01-23 DIAGNOSIS — M109 Gout, unspecified: Secondary | ICD-10-CM | POA: Diagnosis not present

## 2023-01-23 DIAGNOSIS — I5033 Acute on chronic diastolic (congestive) heart failure: Secondary | ICD-10-CM | POA: Diagnosis not present

## 2023-01-23 DIAGNOSIS — M1711 Unilateral primary osteoarthritis, right knee: Secondary | ICD-10-CM | POA: Diagnosis not present

## 2023-01-23 DIAGNOSIS — I270 Primary pulmonary hypertension: Secondary | ICD-10-CM | POA: Diagnosis not present

## 2023-01-23 DIAGNOSIS — J9601 Acute respiratory failure with hypoxia: Secondary | ICD-10-CM | POA: Diagnosis not present

## 2023-01-23 DIAGNOSIS — D509 Iron deficiency anemia, unspecified: Secondary | ICD-10-CM | POA: Diagnosis not present

## 2023-01-23 DIAGNOSIS — L97412 Non-pressure chronic ulcer of right heel and midfoot with fat layer exposed: Secondary | ICD-10-CM | POA: Diagnosis not present

## 2023-01-23 DIAGNOSIS — F32A Depression, unspecified: Secondary | ICD-10-CM | POA: Diagnosis not present

## 2023-01-23 DIAGNOSIS — E43 Unspecified severe protein-calorie malnutrition: Secondary | ICD-10-CM | POA: Diagnosis not present

## 2023-01-23 DIAGNOSIS — I872 Venous insufficiency (chronic) (peripheral): Secondary | ICD-10-CM | POA: Diagnosis not present

## 2023-01-23 DIAGNOSIS — I2510 Atherosclerotic heart disease of native coronary artery without angina pectoris: Secondary | ICD-10-CM | POA: Diagnosis not present

## 2023-01-23 DIAGNOSIS — N1832 Chronic kidney disease, stage 3b: Secondary | ICD-10-CM | POA: Diagnosis not present

## 2023-01-23 DIAGNOSIS — I69354 Hemiplegia and hemiparesis following cerebral infarction affecting left non-dominant side: Secondary | ICD-10-CM | POA: Diagnosis not present

## 2023-01-23 DIAGNOSIS — H547 Unspecified visual loss: Secondary | ICD-10-CM | POA: Diagnosis not present

## 2023-01-23 DIAGNOSIS — I130 Hypertensive heart and chronic kidney disease with heart failure and stage 1 through stage 4 chronic kidney disease, or unspecified chronic kidney disease: Secondary | ICD-10-CM | POA: Diagnosis not present

## 2023-01-23 DIAGNOSIS — L89612 Pressure ulcer of right heel, stage 2: Secondary | ICD-10-CM | POA: Diagnosis not present

## 2023-01-23 DIAGNOSIS — L89622 Pressure ulcer of left heel, stage 2: Secondary | ICD-10-CM | POA: Diagnosis not present

## 2023-01-23 NOTE — Patient Outreach (Signed)
  Care Coordination   01/23/2023 Name: [name] MRN: [id number] DOB: May 26, 1923   Care Coordination Outreach Attempts:  An unsuccessful telephone outreach was attempted today to offer the patient information about available care coordination services as a benefit of their health plan.   Follow Up Plan:  Additional outreach attempts will be made to offer the patient care coordination information and services.   Encounter Outcome:  No Answer   Care Coordination Interventions:  No, not indicated    Bary Leriche, RN, MSN [facility] Care Management Care Management Coordinator Direct Line [phone]

## 2023-01-24 ENCOUNTER — Encounter (INDEPENDENT_AMBULATORY_CARE_PROVIDER_SITE_OTHER): Payer: Medicare Other | Admitting: Internal Medicine

## 2023-01-24 DIAGNOSIS — R112 Nausea with vomiting, unspecified: Secondary | ICD-10-CM | POA: Diagnosis not present

## 2023-01-24 DIAGNOSIS — S91301D Unspecified open wound, right foot, subsequent encounter: Secondary | ICD-10-CM | POA: Diagnosis not present

## 2023-01-25 ENCOUNTER — Inpatient Hospital Stay (HOSPITAL_COMMUNITY)
Admission: EM | Admit: 2023-01-25 | Discharge: 2023-01-28 | DRG: 682 | Disposition: A | Discharge status: Hospice | Payer: Medicare Other | Attending: Internal Medicine | Admitting: Internal Medicine

## 2023-01-25 ENCOUNTER — Emergency Department (HOSPITAL_COMMUNITY): Payer: Medicare Other

## 2023-01-25 ENCOUNTER — Other Ambulatory Visit: Payer: Self-pay

## 2023-01-25 ENCOUNTER — Telehealth: Payer: Self-pay | Admitting: *Deleted

## 2023-01-25 ENCOUNTER — Encounter (HOSPITAL_COMMUNITY): Payer: Self-pay

## 2023-01-25 DIAGNOSIS — Z79899 Other long term (current) drug therapy: Secondary | ICD-10-CM

## 2023-01-25 DIAGNOSIS — E860 Dehydration: Secondary | ICD-10-CM | POA: Diagnosis not present

## 2023-01-25 DIAGNOSIS — S9032XA Contusion of left foot, initial encounter: Secondary | ICD-10-CM | POA: Diagnosis present

## 2023-01-25 DIAGNOSIS — K859 Acute pancreatitis without necrosis or infection, unspecified: Secondary | ICD-10-CM | POA: Diagnosis present

## 2023-01-25 DIAGNOSIS — N184 Chronic kidney disease, stage 4 (severe): Secondary | ICD-10-CM | POA: Diagnosis not present

## 2023-01-25 DIAGNOSIS — I2510 Atherosclerotic heart disease of native coronary artery without angina pectoris: Secondary | ICD-10-CM | POA: Diagnosis present

## 2023-01-25 DIAGNOSIS — Z955 Presence of coronary angioplasty implant and graft: Secondary | ICD-10-CM | POA: Diagnosis not present

## 2023-01-25 DIAGNOSIS — R680 Hypothermia, not associated with low environmental temperature: Secondary | ICD-10-CM | POA: Diagnosis not present

## 2023-01-25 DIAGNOSIS — I5032 Chronic diastolic (congestive) heart failure: Secondary | ICD-10-CM | POA: Diagnosis present

## 2023-01-25 DIAGNOSIS — K521 Toxic gastroenteritis and colitis: Secondary | ICD-10-CM | POA: Diagnosis not present

## 2023-01-25 DIAGNOSIS — E872 Acidosis, unspecified: Secondary | ICD-10-CM | POA: Diagnosis present

## 2023-01-25 DIAGNOSIS — S0990XA Unspecified injury of head, initial encounter: Secondary | ICD-10-CM | POA: Diagnosis not present

## 2023-01-25 DIAGNOSIS — M109 Gout, unspecified: Secondary | ICD-10-CM | POA: Diagnosis present

## 2023-01-25 DIAGNOSIS — Z823 Family history of stroke: Secondary | ICD-10-CM

## 2023-01-25 DIAGNOSIS — S9031XA Contusion of right foot, initial encounter: Secondary | ICD-10-CM | POA: Diagnosis present

## 2023-01-25 DIAGNOSIS — R531 Weakness: Secondary | ICD-10-CM | POA: Diagnosis not present

## 2023-01-25 DIAGNOSIS — T68XXXA Hypothermia, initial encounter: Secondary | ICD-10-CM | POA: Diagnosis not present

## 2023-01-25 DIAGNOSIS — H409 Unspecified glaucoma: Secondary | ICD-10-CM | POA: Diagnosis not present

## 2023-01-25 DIAGNOSIS — Z888 Allergy status to other drugs, medicaments and biological substances status: Secondary | ICD-10-CM

## 2023-01-25 DIAGNOSIS — Z8249 Family history of ischemic heart disease and other diseases of the circulatory system: Secondary | ICD-10-CM

## 2023-01-25 DIAGNOSIS — K5790 Diverticulosis of intestine, part unspecified, without perforation or abscess without bleeding: Secondary | ICD-10-CM

## 2023-01-25 DIAGNOSIS — D509 Iron deficiency anemia, unspecified: Secondary | ICD-10-CM | POA: Diagnosis not present

## 2023-01-25 DIAGNOSIS — Z515 Encounter for palliative care: Secondary | ICD-10-CM | POA: Diagnosis not present

## 2023-01-25 DIAGNOSIS — Z7982 Long term (current) use of aspirin: Secondary | ICD-10-CM

## 2023-01-25 DIAGNOSIS — F32A Depression, unspecified: Secondary | ICD-10-CM | POA: Diagnosis not present

## 2023-01-25 DIAGNOSIS — E875 Hyperkalemia: Secondary | ICD-10-CM | POA: Diagnosis not present

## 2023-01-25 DIAGNOSIS — M869 Osteomyelitis, unspecified: Secondary | ICD-10-CM | POA: Diagnosis not present

## 2023-01-25 DIAGNOSIS — R945 Abnormal results of liver function studies: Secondary | ICD-10-CM | POA: Diagnosis not present

## 2023-01-25 DIAGNOSIS — K5730 Diverticulosis of large intestine without perforation or abscess without bleeding: Secondary | ICD-10-CM | POA: Diagnosis present

## 2023-01-25 DIAGNOSIS — R270 Ataxia, unspecified: Secondary | ICD-10-CM | POA: Diagnosis present

## 2023-01-25 DIAGNOSIS — H35039 Hypertensive retinopathy, unspecified eye: Secondary | ICD-10-CM | POA: Diagnosis present

## 2023-01-25 DIAGNOSIS — I872 Venous insufficiency (chronic) (peripheral): Secondary | ICD-10-CM | POA: Diagnosis not present

## 2023-01-25 DIAGNOSIS — Z8673 Personal history of transient ischemic attack (TIA), and cerebral infarction without residual deficits: Secondary | ICD-10-CM

## 2023-01-25 DIAGNOSIS — M868X7 Other osteomyelitis, ankle and foot: Secondary | ICD-10-CM | POA: Diagnosis not present

## 2023-01-25 DIAGNOSIS — I130 Hypertensive heart and chronic kidney disease with heart failure and stage 1 through stage 4 chronic kidney disease, or unspecified chronic kidney disease: Secondary | ICD-10-CM | POA: Diagnosis not present

## 2023-01-25 DIAGNOSIS — I083 Combined rheumatic disorders of mitral, aortic and tricuspid valves: Secondary | ICD-10-CM | POA: Diagnosis not present

## 2023-01-25 DIAGNOSIS — K8020 Calculus of gallbladder without cholecystitis without obstruction: Secondary | ICD-10-CM | POA: Diagnosis not present

## 2023-01-25 DIAGNOSIS — G8929 Other chronic pain: Secondary | ICD-10-CM | POA: Diagnosis not present

## 2023-01-25 DIAGNOSIS — N261 Atrophy of kidney (terminal): Secondary | ICD-10-CM | POA: Diagnosis not present

## 2023-01-25 DIAGNOSIS — R7401 Elevation of levels of liver transaminase levels: Secondary | ICD-10-CM | POA: Diagnosis not present

## 2023-01-25 DIAGNOSIS — K760 Fatty (change of) liver, not elsewhere classified: Secondary | ICD-10-CM | POA: Diagnosis present

## 2023-01-25 DIAGNOSIS — J9601 Acute respiratory failure with hypoxia: Secondary | ICD-10-CM | POA: Diagnosis not present

## 2023-01-25 DIAGNOSIS — I5033 Acute on chronic diastolic (congestive) heart failure: Secondary | ICD-10-CM | POA: Diagnosis not present

## 2023-01-25 DIAGNOSIS — N19 Unspecified kidney failure: Secondary | ICD-10-CM | POA: Diagnosis present

## 2023-01-25 DIAGNOSIS — M1711 Unilateral primary osteoarthritis, right knee: Secondary | ICD-10-CM | POA: Diagnosis not present

## 2023-01-25 DIAGNOSIS — L89616 Pressure-induced deep tissue damage of right heel: Secondary | ICD-10-CM | POA: Diagnosis not present

## 2023-01-25 DIAGNOSIS — N200 Calculus of kidney: Secondary | ICD-10-CM | POA: Diagnosis present

## 2023-01-25 DIAGNOSIS — H3530 Unspecified macular degeneration: Secondary | ICD-10-CM | POA: Diagnosis not present

## 2023-01-25 DIAGNOSIS — Z7189 Other specified counseling: Secondary | ICD-10-CM | POA: Diagnosis not present

## 2023-01-25 DIAGNOSIS — T3695XA Adverse effect of unspecified systemic antibiotic, initial encounter: Secondary | ICD-10-CM | POA: Diagnosis not present

## 2023-01-25 DIAGNOSIS — S199XXA Unspecified injury of neck, initial encounter: Secondary | ICD-10-CM | POA: Diagnosis not present

## 2023-01-25 DIAGNOSIS — I509 Heart failure, unspecified: Secondary | ICD-10-CM | POA: Diagnosis not present

## 2023-01-25 DIAGNOSIS — I252 Old myocardial infarction: Secondary | ICD-10-CM | POA: Diagnosis not present

## 2023-01-25 DIAGNOSIS — E43 Unspecified severe protein-calorie malnutrition: Secondary | ICD-10-CM | POA: Diagnosis not present

## 2023-01-25 DIAGNOSIS — E538 Deficiency of other specified B group vitamins: Secondary | ICD-10-CM | POA: Diagnosis not present

## 2023-01-25 DIAGNOSIS — S0083XA Contusion of other part of head, initial encounter: Secondary | ICD-10-CM | POA: Diagnosis present

## 2023-01-25 DIAGNOSIS — E162 Hypoglycemia, unspecified: Secondary | ICD-10-CM | POA: Diagnosis not present

## 2023-01-25 DIAGNOSIS — I270 Primary pulmonary hypertension: Secondary | ICD-10-CM | POA: Diagnosis not present

## 2023-01-25 DIAGNOSIS — N179 Acute kidney failure, unspecified: Secondary | ICD-10-CM | POA: Diagnosis not present

## 2023-01-25 DIAGNOSIS — L89612 Pressure ulcer of right heel, stage 2: Secondary | ICD-10-CM | POA: Diagnosis not present

## 2023-01-25 DIAGNOSIS — N1832 Chronic kidney disease, stage 3b: Secondary | ICD-10-CM | POA: Diagnosis not present

## 2023-01-25 DIAGNOSIS — T502X5A Adverse effect of carbonic-anhydrase inhibitors, benzothiadiazides and other diuretics, initial encounter: Secondary | ICD-10-CM | POA: Diagnosis not present

## 2023-01-25 DIAGNOSIS — R9431 Abnormal electrocardiogram [ECG] [EKG]: Secondary | ICD-10-CM | POA: Diagnosis present

## 2023-01-25 DIAGNOSIS — I69354 Hemiplegia and hemiparesis following cerebral infarction affecting left non-dominant side: Secondary | ICD-10-CM | POA: Diagnosis not present

## 2023-01-25 DIAGNOSIS — L89622 Pressure ulcer of left heel, stage 2: Secondary | ICD-10-CM | POA: Diagnosis not present

## 2023-01-25 DIAGNOSIS — W19XXXA Unspecified fall, initial encounter: Secondary | ICD-10-CM | POA: Diagnosis present

## 2023-01-25 DIAGNOSIS — R600 Localized edema: Secondary | ICD-10-CM | POA: Diagnosis not present

## 2023-01-25 DIAGNOSIS — I129 Hypertensive chronic kidney disease with stage 1 through stage 4 chronic kidney disease, or unspecified chronic kidney disease: Secondary | ICD-10-CM | POA: Diagnosis not present

## 2023-01-25 DIAGNOSIS — Z66 Do not resuscitate: Secondary | ICD-10-CM | POA: Diagnosis present

## 2023-01-25 DIAGNOSIS — L97412 Non-pressure chronic ulcer of right heel and midfoot with fat layer exposed: Secondary | ICD-10-CM | POA: Diagnosis not present

## 2023-01-25 DIAGNOSIS — H547 Unspecified visual loss: Secondary | ICD-10-CM | POA: Diagnosis not present

## 2023-01-25 DIAGNOSIS — N189 Chronic kidney disease, unspecified: Secondary | ICD-10-CM | POA: Diagnosis present

## 2023-01-25 DIAGNOSIS — E785 Hyperlipidemia, unspecified: Secondary | ICD-10-CM | POA: Diagnosis not present

## 2023-01-25 DIAGNOSIS — Z96652 Presence of left artificial knee joint: Secondary | ICD-10-CM | POA: Diagnosis present

## 2023-01-25 DIAGNOSIS — R0602 Shortness of breath: Secondary | ICD-10-CM | POA: Diagnosis not present

## 2023-01-25 DIAGNOSIS — M25511 Pain in right shoulder: Secondary | ICD-10-CM | POA: Diagnosis not present

## 2023-01-25 DIAGNOSIS — I4510 Unspecified right bundle-branch block: Secondary | ICD-10-CM | POA: Diagnosis present

## 2023-01-25 LAB — BRAIN NATRIURETIC PEPTIDE: B Natriuretic Peptide: 258.9 pg/mL — ABNORMAL HIGH (ref 0.0–100.0)

## 2023-01-25 LAB — COMPREHENSIVE METABOLIC PANEL
ALT: 158 U/L — ABNORMAL HIGH (ref 0–44)
AST: 176 U/L — ABNORMAL HIGH (ref 15–41)
Albumin: 3.1 g/dL — ABNORMAL LOW (ref 3.5–5.0)
Alkaline Phosphatase: 171 U/L — ABNORMAL HIGH (ref 38–126)
Anion gap: 13 (ref 5–15)
BUN: 128 mg/dL — ABNORMAL HIGH (ref 8–23)
CO2: 14 mmol/L — ABNORMAL LOW (ref 22–32)
Calcium: 8.5 mg/dL — ABNORMAL LOW (ref 8.9–10.3)
Chloride: 108 mmol/L (ref 98–111)
Creatinine, Ser: 2.74 mg/dL — ABNORMAL HIGH (ref 0.44–1.00)
GFR, Estimated: 15 mL/min — ABNORMAL LOW (ref >60)
Glucose, Bld: 119 mg/dL — ABNORMAL HIGH (ref 70–99)
Potassium: 5.8 mmol/L — ABNORMAL HIGH (ref 3.5–5.1)
Sodium: 135 mmol/L (ref 135–145)
Total Bilirubin: 0.8 mg/dL (ref 0.3–1.2)
Total Protein: 6.4 g/dL — ABNORMAL LOW (ref 6.5–8.1)

## 2023-01-25 LAB — CBC WITH DIFFERENTIAL/PLATELET
Abs Immature Granulocytes: 0.04 10*3/uL (ref 0.00–0.07)
Basophils Absolute: 0 10*3/uL (ref 0.0–0.1)
Basophils Relative: 0 %
Eosinophils Absolute: 0 10*3/uL (ref 0.0–0.5)
Eosinophils Relative: 0 %
HCT: 40.1 % (ref 36.0–46.0)
Hemoglobin: 11.9 g/dL — ABNORMAL LOW (ref 12.0–15.0)
Immature Granulocytes: 0 %
Lymphocytes Relative: 8 %
Lymphs Abs: 1 10*3/uL (ref 0.7–4.0)
MCH: 26.9 pg (ref 26.0–34.0)
MCHC: 29.7 g/dL — ABNORMAL LOW (ref 30.0–36.0)
MCV: 90.7 fL (ref 80.0–100.0)
Monocytes Absolute: 0.1 10*3/uL (ref 0.1–1.0)
Monocytes Relative: 1 %
Neutro Abs: 11.7 10*3/uL — ABNORMAL HIGH (ref 1.7–7.7)
Neutrophils Relative %: 91 %
Platelets: UNDETERMINED 10*3/uL (ref 150–400)
RBC: 4.42 MIL/uL (ref 3.87–5.11)
RDW: 17.2 % — ABNORMAL HIGH (ref 11.5–15.5)
WBC: 12.9 10*3/uL — ABNORMAL HIGH (ref 4.0–10.5)
nRBC: 2.5 % — ABNORMAL HIGH (ref 0.0–0.2)

## 2023-01-25 LAB — BLOOD GAS, VENOUS
Acid-base deficit: 11.4 mmol/L — ABNORMAL HIGH (ref 0.0–2.0)
Bicarbonate: 15.6 mmol/L — ABNORMAL LOW (ref 20.0–28.0)
O2 Saturation: 76.2 %
Patient temperature: 37
pCO2, Ven: 38 mmHg — ABNORMAL LOW (ref 44–60)
pH, Ven: 7.22 — ABNORMAL LOW (ref 7.25–7.43)
pO2, Ven: 47 mmHg — ABNORMAL HIGH (ref 32–45)

## 2023-01-25 LAB — PROTIME-INR
INR: 1 (ref 0.8–1.2)
Prothrombin Time: 13.2 seconds (ref 11.4–15.2)

## 2023-01-25 MED ORDER — ONDANSETRON HCL 4 MG/2ML IJ SOLN
4.0000 mg | Freq: Once | INTRAMUSCULAR | Status: AC
  Administered 2023-01-25: 4 mg via INTRAVENOUS
  Filled 2023-01-25: qty 2

## 2023-01-25 MED ORDER — SODIUM ZIRCONIUM CYCLOSILICATE 10 G PO PACK
10.0000 g | PACK | Freq: Once | ORAL | Status: AC
  Administered 2023-01-25: 10 g via ORAL
  Filled 2023-01-25: qty 1

## 2023-01-25 MED ORDER — SODIUM CHLORIDE 0.9 % IV BOLUS
1000.0000 mL | Freq: Once | INTRAVENOUS | Status: AC
  Administered 2023-01-25: 1000 mL via INTRAVENOUS

## 2023-01-25 MED ORDER — SODIUM CHLORIDE 0.9 % IV SOLN: INTRAVENOUS | Status: DC

## 2023-01-25 MED ORDER — SODIUM CHLORIDE 0.9 % IV SOLN
1.0000 g | Freq: Once | INTRAVENOUS | Status: AC
  Administered 2023-01-25: 1 g via INTRAVENOUS
  Filled 2023-01-25: qty 10

## 2023-01-25 MED ORDER — METOCLOPRAMIDE HCL 5 MG PO TABS: 5.0000 mg | ORAL_TABLET | Freq: Two times a day (BID) | ORAL | 0 refills | Status: DC | PRN

## 2023-01-25 NOTE — Progress Notes (Deleted)
Cardiology Clinic Note   Patient Name: [name] Date of Encounter: 01/25/2023  Primary Care Provider:  Irena Reichmann, DO Primary Cardiologist:  Olga Millers, MD  Patient Profile    [name] 89 year old female presents the clinic today for follow-up evaluation of her coronary artery disease and essential hypertension.  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    Fall at home 09/26/2012   Glaucoma    OU   Gout, joint    Hypertension    Hypertensive retinopathy    OU   Macular degeneration    OU   NSTEMI (non-ST elevated myocardial infarction) 08/2015   Stroke    Past Surgical History:  Procedure Laterality Date   ABDOMINAL HYSTERECTOMY     BREAST SURGERY     CYST   CARDIAC CATHETERIZATION N/A 08/18/2015   Procedure: Left Heart Cath and Coronary Angiography;  Surgeon: Lennette Bihari, MD;  Location: [facility] INVASIVE CV LAB;  Service: Cardiovascular;  Laterality: N/A;   CARDIAC CATHETERIZATION  08/18/2015   Procedure: Coronary Stent Intervention;  Surgeon: Lennette Bihari, MD;  Location: [facility] INVASIVE CV LAB;  Service: Cardiovascular;;   CATARACT EXTRACTION Bilateral    DILATION AND CURETTAGE OF UTERUS     EYE SURGERY Bilateral    HERNIA REPAIR     IRIDOTOMY / IRIDECTOMY Right    JOINT REPLACEMENT  left knee   REFRACTIVE SURGERY Right 2017    Allergies  Allergies  Allergen Reactions   Meloxicam Hives   Nitrofurantoin    Other Other (See Comments)   Guaifenesin Er Rash    Other reaction(s): rash   Nitrofuran Derivatives Rash    History of Present Illness    [name] has a PMH of coronary artery disease, hyperlipidemia, hypertension, CVA, and anemia.  She underwent carotid ultrasound 10/16 which was unremarkable.  She had NSTEMI 11/16 and underwent PCI with PTCA to her distal RCA and DES.  Echocardiogram 11/16 showed an EF of  65-70%, mild LVH, moderate tricuspid valve regurgitation.  She had normal ABIs 9/20.  She was admitted to [facility] 1/24 for acute on chronic diastolic CHF exacerbation.  She was noted to have dyspnea and lower extremity edema.  She was noted to be hypothermic on arrival and was also found to have a UTI.  Her BNP was elevated at 580.4.  CT chest showed marked enlargement of her main pulmonary artery and right pulmonary artery which was significant for pulmonary arterial hypertension.  She was also noted to have small bilateral pleural effusion with bibasilar compressive atelectasis.  Echocardiogram 10/22/2022 showed an EF of 60 to 65% moderate LVH, G2 DD, normal RV function, severely elevated pulmonary artery systolic pressure and mild mitral valve regurgitation with moderate tricuspid valve regurgitation.  She was treated with IV diuresis and was discharged net -10.5 L.  She was discharged to skilled nursing.  She was seen in follow-up by Robet Leu, PA-C on 11/28/2022.  She was accompanied by her daughter.  She is hard of hearing and her daughter helps with history.  She denied chest pain shortness of breath, orthopnea, palpitations, presyncope and syncope.  She was noted to have mild ankle edema.  Her lower extremity edema remained stable with Una boot and diuresis.  She was progressing with her physical activity with the help of physical  therapy/rehab.  She plans to return home that week.  Her dry weight was noted to be somewhere between 132 and 134 pounds.  She was wheelchair-bound.  She denied falls.  She presented to the clinic 01/11/23 for follow-up evaluation and stated she felt okay.  She presented with her daughter.  Her daughter reported she had some ham and increased sodium food over the Easter holiday.  She also had [employer] yesterday.  Her weight was noted to be 137 pounds.  Her blood pressure was well-controlled at 122/62.  She had a right foot wound which was being monitored by  wound care.  She had Curlex and Coban on bilateral lower extremities.  We reviewed the importance of heart healthy low-sodium diet and daily weights.  She expressed understanding.  I  increased her Lasix to 40 mg for 3 days and then had her return to her normal dosing after.  I gave  20 mill equivalents of potassium for 3 days and instructed to stop after.  BMP 01/17/2023 showed a potassium of 5.6 and creatinine of 1.74.  She presents to the clinic today for follow-up evaluation and states***.  Today she denies chest pain, shortness of breath, lower extremity edema, fatigue, palpitations, melena, hematuria, hemoptysis, diaphoresis, weakness, presyncope, syncope, orthopnea, and PND.  Chronic diastolic CHF, pulmonary hypertension-weight today 137***.  Generalized nonpitting bilateral ankle edema.  Continues home physical therapy.  Reports dietary compliance. Continue furosemide, metoprolol Heart healthy low-sodium diet-salty 6 reviewed Maintain physical activity Daily weights-contact office with a weight increase of 2 to 3 pounds overnight or 5 pounds in 1 week. Maintain weight log Order BMP   Essential hypertension-BP today 122/62***. Continue current medical therapy Maintain blood pressure log  Coronary artery disease-no chest pain today.  Underwent successful PCI with PTCA and DES to her RCA 11/16. Continue amlodipine, atorvastatin, metoprolol Heart healthy low-sodium diet  Hyperlipidemia-LDL 61 on 10/21. Continue aspirin, atorvastatin Follows with PCP  Disposition: Follow-up with Dr. Jens Som or me in 3-4 months   Home Medications    Prior to Admission medications   Medication Sig Start Date End Date Taking? Authorizing Provider  Acetaminophen 325 MG CAPS Take 325 mg by mouth 3 (three) times daily as needed (pain).    [provider]  amLODipine (NORVASC) 2.5 MG tablet Take 1 tablet (2.5 mg total) by mouth daily. 10/31/22   Rodolph Bong, MD  aspirin 81 MG chewable  tablet Chew 81 mg by mouth daily.    [provider]  atorvastatin (LIPITOR) 80 MG tablet Take 1 tablet (80 mg total) by mouth daily at 6 PM. 08/20/15   Ghimire, Werner Lean, MD  benzonatate (TESSALON) 100 MG capsule Take 1 capsule (100 mg total) by mouth 3 (three) times daily as needed for cough. 10/31/22   Rodolph Bong, MD  brimonidine (ALPHAGAN) 0.2 % ophthalmic solution Place 1 drop into both eyes in the morning and at bedtime.    [provider]  Calcium Carbonate-Vitamin D (CALTRATE 600+D PO) Take 1 tablet by mouth daily. gummy    [provider]  camphor-menthol Wynelle Fanny) lotion Apply topically as needed for itching. 10/31/22   Rodolph Bong, MD  chlorpheniramine (CHLOR-TRIMETON) 4 MG tablet Take 4 mg by mouth daily.    [provider]  Cranberry Extract 250 MG TABS as directed Orally    [provider]  [employer] SOD CHLORIDE HYPERTONICITY 5 % ophthalmic ointment PLACE 1 APPLICATION INTO THE RIGHT EYE AT BEDTIME. Patient taking differently: Place 1  Application into the right eye at bedtime. 07/01/22   Rennis Chris, MD  Cyanocobalamin 1000 MCG TBCR 1 tablet Orally Once a day for 30 day(s)    [provider]  denosumab (PROLIA) 60 MG/ML SOSY injection 60 mg See admin instructions. Patient not taking: Reported on 11/28/2022 12/18/17   [provider]  diclofenac Sodium (VOLTAREN) 1 % GEL Apply 2 g topically 4 (four) times daily. Apply to Knee 10/31/22   Rodolph Bong, MD  famotidine (PEPCID) 20 MG tablet Take 20 mg by mouth daily.    [provider]  FLUZONE HIGH-DOSE QUADRIVALENT 0.7 ML SUSY  07/14/19   [provider]  furosemide (LASIX) 40 MG tablet Take 20 mg by mouth daily. MONDAY , [facility] and FRIDAY 12/24/21   [provider]  GEMTESA 75 MG TABS Take 75 mg by mouth daily. 10/15/21   [provider]  gentamicin cream (GARAMYCIN) 0.1 % Apply 1 application topically 2 (two) times  daily. Patient not taking: Reported on 11/28/2022 11/29/21   Felecia Shelling, DPM  Loteprednol Etabonate (LOTEMAX SM) 0.38 % GEL Place 1 drop into the right eye 2 (two) times daily. 07/01/22   Rennis Chris, MD  Menthol, Topical Analgesic, (BIOFREEZE) 4 % GEL Apply 1 Application topically daily as needed (shoulder pain).    [provider]  metoprolol tartrate (LOPRESSOR) 25 MG tablet Take 0.5 tablets (12.5 mg total) by mouth 2 (two) times daily. 10/31/22   Rodolph Bong, MD  PFIZER-BIONT COVID-19 VAC-TRIS SUSP injection  05/08/21   [provider]  Polyethyl Glycol-Propyl Glycol (SYSTANE OP) Apply 1 drop to eye 2 (two) times daily as needed (dry eyes). Patient not taking: Reported on 11/28/2022    [provider]  sodium chloride (MURO 128) 5 % ophthalmic solution INSTILL 1 DROP INTO RIGHT EYE 4 TIMES A DAY 08/17/22   Rennis Chris, MD  timolol (TIMOPTIC) 0.5 % ophthalmic solution Place 1 drop into both eyes every morning. 11/01/21   [provider]    Family History    Family History  Problem Relation Age of Onset   Heart disease Mother    Stroke Mother    She indicated that her mother is deceased. She indicated that her father is deceased.  Social History    Social History   Socioeconomic History   Marital status: Divorced    Spouse name: Not on file   Number of children: 3   Years of education: 12   Highest education level: Not on file  Occupational History    Comment: retired  Tobacco Use   Smoking status: Never   Smokeless tobacco: Never  Vaping Use   Vaping Use: Never used  Substance and Sexual Activity   Alcohol use: No    Alcohol/week: 0.0 standard drinks of alcohol   Drug use: No   Sexual activity: Never    Birth control/protection: Post-menopausal  Other Topics Concern   Not on file  Social History Narrative   Patient is single and lives at home alone.   Retired   Education 12th grade    Right handed   Caffeine  sometimes    Social Determinants of Corporate investment banker Strain: Low Risk  (01/18/2023)   Overall Financial Resource Strain (CARDIA)    Difficulty of Paying Living Expenses: Not very hard  Food Insecurity: No Food Insecurity (01/18/2023)   Hunger Vital Sign    Worried About Running Out of Food in the Last Year: Never  true    Ran Out of Food in the Last Year: Never true  Transportation Needs: No Transportation Needs (01/18/2023)   PRAPARE - Administrator, Civil Service (Medical): No    Lack of Transportation (Non-Medical): No  Physical Activity: Inactive (12/26/2022)   Exercise Vital Sign    Days of Exercise per Week: 0 days    Minutes of Exercise per Session: 0 min  Stress: Not on file  Social Connections: Not on file  Intimate Partner Violence: Not At Risk (10/21/2022)   Humiliation, Afraid, Rape, and Kick questionnaire    Fear of Current or Ex-Partner: No    Emotionally Abused: No    Physically Abused: No    Sexually Abused: No     Review of Systems    General:  No chills, fever, night sweats or weight changes.  Cardiovascular:  No chest pain, dyspnea on exertion, edema, orthopnea, palpitations, paroxysmal nocturnal dyspnea. Dermatological: No rash, lesions/masses Respiratory: No cough, dyspnea Urologic: No hematuria, dysuria Abdominal:   No nausea, vomiting, diarrhea, bright red blood per rectum, melena, or hematemesis Neurologic:  No visual changes, wkns, changes in mental status. All other systems reviewed and are otherwise negative except as noted above.  Physical Exam    VS:  There were no vitals taken for this visit. , BMI There is no height or weight on file to calculate BMI. GEN: Well nourished, well developed, in no acute distress. HEENT: normal. Neck: Supple, no JVD, carotid bruits, or masses. Cardiac: RRR, no murmurs, rubs, or gallops. No clubbing, cyanosis, generalized bilateral lower extremity nonpitting edema.  Radials/DP/PT 2+ and equal bilaterally.   Respiratory:  Respirations regular and unlabored, clear to auscultation bilaterally. GI: Soft, nontender, nondistended, BS + x 4. MS: no deformity or atrophy. Skin: warm and dry, no rash. Neuro:  Strength and sensation are intact. Psych: Normal affect.  Accessory Clinical Findings    Recent Labs: 10/21/2022: TSH 3.088 10/26/2022: ALT 15 10/27/2022: B Natriuretic Peptide 699.9; Magnesium 2.5 10/31/2022: Hemoglobin 9.7; Platelets 301 01/17/2023: BUN 79; Creatinine, Ser 1.74; Potassium 5.6; Sodium 139   Recent Lipid Panel    Component Value Date/Time   CHOL 147 08/11/2017 0844   TRIG 128 08/11/2017 0844   HDL 71 08/11/2017 0844   CHOLHDL 2.1 08/11/2017 0844   CHOLHDL 2.2 06/23/2016 0845   VLDL 29 06/23/2016 0845   LDLCALC 50 08/11/2017 0844    No BP recorded.  {Refresh Note OR Click here to enter BP  :1}***    ECG personally reviewed by me today-none today.  Echocardiogram 10/22/2022  IMPRESSIONS     1. Small mid cavitary gradient . Left ventricular ejection fraction, by  estimation, is 60 to 65%. The left ventricle has normal function. The left  ventricle has no regional wall motion abnormalities. There is moderate  left ventricular hypertrophy. Left  ventricular diastolic parameters are consistent with Grade II diastolic  dysfunction (pseudonormalization). Elevated left ventricular end-diastolic  pressure.   2. Right ventricular systolic function is normal. The right ventricular  size is normal. There is severely elevated pulmonary artery systolic  pressure.   3. Mean gradient across valve in diastole 12 mmHg but MVA normal by PT1/2  secondary to MAC. The mitral valve is normal in structure. Mild mitral  valve regurgitation. No evidence of mitral stenosis. Moderate mitral  annular calcification.   4. Tricuspid valve regurgitation is moderate.   5. The aortic valve is tricuspid. There is moderate calcification of the  aortic valve. There  is moderate thickening of the  aortic valve. Aortic  valve regurgitation is not visualized. Aortic valve  sclerosis/calcification is present, without any evidence  of aortic stenosis.   6. The inferior vena cava is normal in size with greater than 50%  respiratory variability, suggesting right atrial pressure of 3 mmHg.   FINDINGS   Left Ventricle: Small mid cavitary gradient. Left ventricular ejection  fraction, by estimation, is 60 to 65%. The left ventricle has normal  function. The left ventricle has no regional wall motion abnormalities.  The left ventricular internal cavity size   was normal in size. There is moderate left ventricular hypertrophy. Left  ventricular diastolic parameters are consistent with Grade II diastolic  dysfunction (pseudonormalization). Elevated left ventricular end-diastolic  pressure.   Right Ventricle: The right ventricular size is normal. No increase in  right ventricular wall thickness. Right ventricular systolic function is  normal. There is severely elevated pulmonary artery systolic pressure. The  tricuspid regurgitant velocity is  3.43 m/s, and with an assumed right atrial pressure of 15 mmHg, the  estimated right ventricular systolic pressure is 62.1 mmHg.   Left Atrium: Left atrial size was normal in size.   Right Atrium: Right atrial size was normal in size.   Pericardium: There is no evidence of pericardial effusion.   Mitral Valve: Mean gradient across valve in diastole 12 mmHg but MVA  normal by PT1/2 secondary to MAC. The mitral valve is normal in structure.  There is moderate thickening of the mitral valve leaflet(s). Moderate  mitral annular calcification. Mild  mitral valve regurgitation. No evidence of mitral valve stenosis. MV peak  gradient, 23.8 mmHg. The mean mitral valve gradient is 12.0 mmHg.   Tricuspid Valve: The tricuspid valve is normal in structure. Tricuspid  valve regurgitation is moderate . No evidence of tricuspid stenosis.   Aortic Valve: The  aortic valve is tricuspid. There is moderate  calcification of the aortic valve. There is moderate thickening of the  aortic valve. Aortic valve regurgitation is not visualized. Aortic valve  sclerosis/calcification is present, without any   evidence of aortic stenosis. Aortic valve mean gradient measures 11.0  mmHg. Aortic valve peak gradient measures 19.7 mmHg. Aortic valve area, by  VTI measures 2.46 cm.   Pulmonic Valve: The pulmonic valve was normal in structure. Pulmonic valve  regurgitation is not visualized. No evidence of pulmonic stenosis.   Aorta: The aortic root is normal in size and structure.   Venous: The inferior vena cava is normal in size with greater than 50%  respiratory variability, suggesting right atrial pressure of 3 mmHg.   IAS/Shunts: No atrial level shunt detected by color flow Doppler.    Assessment & Plan   1.  ***  [name] Paula Bayless NP-C     01/25/2023, 7:37 AM [facility] [address] Office [phone] Fax [phone]    I spent 14 ***minutes examining this patient, reviewing medications, and using patient centered shared decision making involving her cardiac care.  Prior to her visit I spent greater than 20 minutes reviewing her past medical history,  medications, and prior cardiac tests.

## 2023-01-25 NOTE — ED Provider Notes (Signed)
[place] EMERGENCY DEPARTMENT AT [facility] Provider Note  CSN: [id number] Arrival date & time: 01/25/23 1850  Chief Complaint(s) Fall  HPI MARKALA SITTS is a 89 y.o. female with past medical history as below, significant for CVA, HTN, CVA, ataxia, CKD who presents to the ED with complaint of weakness, fall.  Patient lives at home, home health.  She had a fall will try to go to the bathroom today, missed the toilet hit her head on the ground.  No LOC.  Unable to get up from the floor without assistance.  Daughter at bedside contributes to history, reports that she was started on antibiotic around a week ago and since has been having intermittent diarrhea, right lower quad abdominal pain, nausea.  Poor p.o. intake over the past 2 days, intermittent vomiting.  She is having diarrhea, no BRBPR or melena.  Right lower quad abdominal pain described as sharp, stabbing, cramping.  No behavior changes.  Patient with very limited p.o. intake past 2 days secondary to early satiety and refusal/nausea.  No Fevers or chills last few days.  No chest pain or dyspnea.  Sister reports pt has been on limited activity 2/2 heel wounds b/l but typically can transfer from chair to bed or toilet etc, unable to transfer last 2 days, legs weak b/l, no numbness   Received wound care for bilateral ulcers to her heels, improving per family.  Past Medical History 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    Fall at home 09/26/2012   Glaucoma    OU   Gout, joint    Hypertension    Hypertensive retinopathy    OU   Macular degeneration    OU   NSTEMI (non-ST elevated myocardial infarction) 08/2015   Stroke    Patient Active Problem List   Diagnosis Date Noted   Pyogenic inflammation of bone 01/12/2023   Hypothermia 10/21/2022   Acute on chronic diastolic CHF (congestive heart failure) 10/20/2022    UTI (urinary tract infection) 01/14/2022   Glaucoma 01/14/2022   Macular degeneration 01/14/2022   Cystitis 01/14/2022   Ataxia following cerebral infarction 10/07/2020   Chronic diastolic heart failure 10/07/2020   Cough 10/07/2020   Diabetic renal disease 10/07/2020   Encounter for general adult medical examination without abnormal findings 10/07/2020   Gastroesophageal reflux disease 10/07/2020   Generalized osteoarthritis 10/07/2020   Gout 10/07/2020   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 10/07/2020   Iron deficiency anemia 10/07/2020   Knee pain 10/07/2020   Osteoporosis 10/07/2020   Primary pulmonary hypertension 10/07/2020   Recurrent falls 10/07/2020   Tricuspid valve disorder 10/07/2020   Vitamin B12 deficiency (non anemic) 10/07/2020   Vitamin D deficiency 10/07/2020   Bilateral elbow joint pain 03/17/2020   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, severe stage 01/06/2020   Pain of left hip joint 06/12/2019   Pain in joint of left shoulder 05/03/2018   Osteoarthritis of knee 12/28/2017   Upper airway cough syndrome 07/24/2017   Coronary artery disease 11/12/2015   Hyperlipidemia 11/12/2015   TIA (transient ischemic attack)    NSTEMI (non-ST elevated myocardial infarction) 08/18/2015   History of CVA (cerebrovascular accident) 09/26/2012   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 09/26/2012   UTI (lower urinary tract infection) 09/26/2012   Hyperkalemia 09/26/2012   CKD (chronic kidney disease), stage  III 09/26/2012   Essential hypertension    Home Medication(s) Prior to Admission medications   Medication Sig Start Date End Date Taking? Authorizing Provider  Acetaminophen 325 MG CAPS Take 325 mg by mouth 3 (three) times daily as needed (pain).    [provider]  amLODipine (NORVASC) 2.5 MG tablet Take 1 tablet (2.5 mg total) by mouth daily. 10/31/22   Rodolph Bong, MD  aspirin 81 MG chewable tablet  Chew 81 mg by mouth daily.    [provider]  atorvastatin (LIPITOR) 80 MG tablet Take 1 tablet (80 mg total) by mouth daily at 6 PM. 08/20/15   Ghimire, Werner Lean, MD  benzonatate (TESSALON) 100 MG capsule Take 1 capsule (100 mg total) by mouth 3 (three) times daily as needed for cough. 10/31/22   Rodolph Bong, MD  brimonidine (ALPHAGAN) 0.2 % ophthalmic solution Place 1 drop into both eyes in the morning and at bedtime.    [provider]  Calcium Carbonate-Vitamin D (CALTRATE 600+D PO) Take 1 tablet by mouth daily. gummy    [provider]  camphor-menthol Wynelle Fanny) lotion Apply topically as needed for itching. 10/31/22   Rodolph Bong, MD  chlorpheniramine (CHLOR-TRIMETON) 4 MG tablet Take 4 mg by mouth daily.    [provider]  Cranberry Extract 250 MG TABS as directed Orally    [provider]  [employer] SOD CHLORIDE HYPERTONICITY 5 % ophthalmic ointment PLACE 1 APPLICATION INTO THE RIGHT EYE AT BEDTIME. Patient taking differently: Place 1 Application into the right eye at bedtime. 07/01/22   Rennis Chris, MD  Cyanocobalamin 1000 MCG TBCR 1 tablet Orally Once a day for 30 day(s)    [provider]  denosumab (PROLIA) 60 MG/ML SOSY injection 60 mg See admin instructions. 12/18/17   [provider]  diclofenac Sodium (VOLTAREN) 1 % GEL Apply 2 g topically 4 (four) times daily. Apply to Knee 10/31/22   Rodolph Bong, MD  famotidine (PEPCID) 20 MG tablet Take 20 mg by mouth daily.    [provider]  FLUZONE HIGH-DOSE QUADRIVALENT 0.7 ML SUSY  07/14/19   [provider]  furosemide (LASIX) 40 MG tablet Take 20 mg by mouth daily. MONDAY , [facility] and FRIDAY 12/24/21   [provider]  GEMTESA 75 MG TABS Take 75 mg by mouth daily. 10/15/21   [provider]  gentamicin cream (GARAMYCIN) 0.1 % Apply 1 application topically 2 (two) times daily. 11/29/21   Felecia Shelling, DPM  Loteprednol Etabonate  (LOTEMAX SM) 0.38 % GEL Place 1 drop into the right eye 2 (two) times daily. 07/01/22   Rennis Chris, MD  Menthol, Topical Analgesic, (BIOFREEZE) 4 % GEL Apply 1 Application topically daily as needed (shoulder pain).    [provider]  metoCLOPramide (REGLAN) 5 MG tablet Take 1 tablet (5 mg total) by mouth 2 (two) times daily as needed for nausea. 01/25/23   Blanchard Kelch, NP  metoprolol tartrate (LOPRESSOR) 25 MG tablet Take 0.5 tablets (12.5 mg total) by mouth 2 (two) times daily. 10/31/22   Rodolph Bong, MD  minocycline (MINOCIN) 100 MG capsule Take 2 capsules (200 mg total) by mouth 2 (two) times daily. 01/12/23 02/23/23  Kathlynn Grate, DO  PFIZER-BIONT COVID-19 VAC-TRIS SUSP injection  05/08/21   [provider]  Polyethyl Glycol-Propyl Glycol (SYSTANE OP) Apply 1 drop to eye 2 (two) times daily as needed (dry eyes).    [provider]  potassium  chloride SA (KLOR-CON M) 20 MEQ tablet Take 1 tablet (20 mEq total) by mouth daily. 01/11/23   Ronney Asters, NP  sodium chloride (MURO 128) 5 % ophthalmic solution INSTILL 1 DROP INTO RIGHT EYE 4 TIMES A DAY 08/17/22   Rennis Chris, MD  timolol (TIMOPTIC) 0.5 % ophthalmic solution Place 1 drop into both eyes every morning. 11/01/21   [provider]                                                                                                                                    Past Surgical History Past Surgical History:  Procedure Laterality Date   ABDOMINAL HYSTERECTOMY     BREAST SURGERY     CYST   CARDIAC CATHETERIZATION N/A 08/18/2015   Procedure: Left Heart Cath and Coronary Angiography;  Surgeon: Lennette Bihari, MD;  [facility]: [facility] INVAS[facility] CV LAB;  Service: Cardiovascular;  Laterality: N/A;   CARDIAC CATHETERIZATION  08/18/2015   Procedure: Coronary Stent Intervention;  Surgeon: Lennette Bihari, MD;  Location: [facility] INVASIVE CV LAB;  Service: Cardiovascular;;   CATARACT EXTRACTION Bilateral     DILATION AND CURETTAGE OF UTERUS     EYE SURGERY Bilateral    HERNIA REPAIR     IRIDOTOMY / IRIDECTOMY Right    JOINT REPLACEMENT  left knee   REFRACTIVE SURGERY Right 2017   Family History Family History  Problem Relation Age of Onset   Heart disease Mother    Stroke Mother     Social History Social History   Tobacco Use   Smoking status: Never   Smokeless tobacco: Never  Vaping Use   Vaping Use: Never used  Substance Use Topics   Alcohol use: No    Alcohol/week: 0.0 standard drinks of alcohol   Drug use: No   Allergies Meloxicam, Nitrofurantoin, Other, Guaifenesin er, and Nitrofuran derivatives  Review of Systems Review of Systems  Constitutional:  Positive for fatigue. Negative for activity change and fever.  HENT:  Negative for facial swelling and trouble swallowing.   Eyes:  Negative for discharge and redness.  Respiratory:  Negative for cough and shortness of breath.   Cardiovascular:  Positive for leg swelling. Negative for chest pain and palpitations.  Gastrointestinal:  Positive for abdominal pain, diarrhea, nausea and vomiting.  Genitourinary:  Negative for dysuria and flank pain.  Musculoskeletal:  Positive for gait problem. Negative for back pain.  Skin:  Negative for pallor and rash.  Neurological:  Negative for syncope and headaches.    Physical Exam Vital Signs  I have reviewed the triage vital signs BP (!) 144/62   Pulse 60   Temp (!) 94.2 F (34.6 C) (Rectal)   Resp 16   Ht 4\' 11"  (1.499 m)   Wt 62.1 kg   SpO2 100%   BMI 27.65 kg/m  Physical Exam Vitals and nursing note reviewed.  Constitutional:  General: She is not in acute distress.    Appearance: Normal appearance.  HENT:     Head: Normocephalic. Contusion present. No right periorbital erythema or left periorbital erythema.     Jaw: There is normal jaw occlusion.     Right Ear: External ear normal.     Left Ear: External ear normal.     Nose: Nose normal.     Mouth/Throat:      Mouth: Mucous membranes are dry.  Eyes:     General: No scleral icterus.       Right eye: No discharge.        Left eye: No discharge.     Extraocular Movements: Extraocular movements intact.     Pupils: Pupils are equal, round, and reactive to light.  Cardiovascular:     Rate and Rhythm: Normal rate and regular rhythm.     Pulses: Normal pulses.     Heart sounds: Normal heart sounds.  Pulmonary:     Effort: Pulmonary effort is normal. No respiratory distress.     Breath sounds: Normal breath sounds.  Abdominal:     General: Abdomen is flat.     Tenderness: There is no abdominal tenderness.  Musculoskeletal:        General: Normal range of motion.     Cervical back: Normal range of motion.     Right lower leg: Pitting Edema present.     Left lower leg: Pitting Edema present.       Legs:     Comments: Bilateral lower extremity pitting edema Stasis dermatitis noted bilateral Heel wounds noted bilateral, appear to be healing appropriately  Skin:    General: Skin is warm and dry.     Capillary Refill: Capillary refill takes less than 2 seconds.  Neurological:     Mental Status: She is alert and oriented to person, place, and time.     GCS: GCS eye subscore is 4. GCS verbal subscore is 5. GCS motor subscore is 6.     Cranial Nerves: Cranial nerves 2-12 are intact.     Sensory: Sensation is intact.     Coordination: Coordination is intact.     Comments: Hard of hearing Gait not tested 2/2 pt safety  Psychiatric:        Mood and Affect: Mood normal.        Behavior: Behavior normal.     ED Results and Treatments Labs (all labs ordered are listed, but only abnormal results are displayed) Labs Reviewed  CBC WITH DIFFERENTIAL/PLATELET - Abnormal; Notable for the following components:      Result Value   WBC 12.9 (*)    Hemoglobin 11.9 (*)    MCHC 29.7 (*)    RDW 17.2 (*)    nRBC 2.5 (*)    Neutro Abs 11.7 (*)    All other components within normal limits   COMPREHENSIVE METABOLIC PANEL - Abnormal; Notable for the following components:   Potassium 5.8 (*)    CO2 14 (*)    Glucose, Bld 119 (*)    BUN 128 (*)    Creatinine, Ser 2.74 (*)    Calcium 8.5 (*)    Total Protein 6.4 (*)    Albumin 3.1 (*)    AST 176 (*)    ALT 158 (*)    Alkaline Phosphatase 171 (*)    GFR, Estimated 15 (*)    All other components within normal limits  BRAIN NATRIURETIC PEPTIDE - Abnormal; Notable for the following  components:   B Natriuretic Peptide 258.9 (*)    All other components within normal limits  BLOOD GAS, VENOUS - Abnormal; Notable for the following components:   pH, Ven 7.22 (*)    pCO2, Ven 38 (*)    pO2, Ven 47 (*)    Bicarbonate 15.6 (*)    Acid-base deficit 11.4 (*)    All other components within normal limits  CULTURE, BLOOD (ROUTINE X 2)  CULTURE, BLOOD (ROUTINE X 2)  PROTIME-INR  URINALYSIS, ROUTINE W REFLEX MICROSCOPIC  AMMONIA  SALICYLATE LEVEL  ACETAMINOPHEN LEVEL  LACTIC ACID, PLASMA  LACTIC ACID, PLASMA  LIPASE, BLOOD                                                                                                                          Radiology CT ABDOMEN PELVIS WO CONTRAST  Result Date: 01/25/2023 CLINICAL DATA:  Right lower quadrant pain and early satiety with nausea, vomiting and diarrhea. EXAM: CT ABDOMEN AND PELVIS WITHOUT CONTRAST TECHNIQUE: Multidetector CT imaging of the abdomen and pelvis was performed following the standard protocol without IV contrast. RADIATION DOSE REDUCTION: This exam was performed according to the departmental dose-optimization program which includes automated exposure control, adjustment of the mA and/or kV according to patient size and/or use of iterative reconstruction technique. COMPARISON:  CT without contrast 01/13/2022 FINDINGS: Lower chest: Lung bases again show chronic scarring changes without infiltrates, and chronic eventration along the anterior right diaphragm. There is mild  cardiomegaly. There are calcifications in the mitral and aortic annuli and coronary arteries and heavily in the descending aorta. Hepatobiliary: Limited image detail due to respiratory motion, noncontrast technique and artifact from the patient's right arm in the field. The liver is moderately steatotic. No focal abnormality is seen without contrast, as visualized. There are tiny stones barium in posterior gallbladder without wall thickening or biliary dilatation. Pancreas: No stranding is seen alongside the pancreatic tail consistent with acute pancreatitis. Correlate with lipase clinical symptoms. There is only slight stranding of the pancreatic head. There is no appreciable mass without contrast. There is mild pancreatic atrophy. Spleen: No abnormality is seen through the breathing motion. Adrenals/Urinary Tract: Mild general cortical volume loss both kidneys. Bilateral cysts are again noted largest on the left is 6.5 cm, largest on the right 2.8 cm. There is right extrarenal pelvis. There is a 5 mm nonobstructive caliceal stone in the mid to lower pole left kidney, 7 mm nonobstructive stone in the inferior pole on the right and 1 or 2 punctate nonobstructive caliceal stones in the right upper pole. No hydronephrosis or ureteral stone is seen on either side. The bladder wall and lumen are unremarkable. Stomach/Bowel: No dilatation or wall thickening including the appendix. There is advanced sigmoid diverticulosis without evidence of acute diverticulitis. Vascular/Lymphatic: Heavy aortoiliac calcific plaque with visceral branch vessel atherosclerosis as well particularly the proximal renal arteries, proximal SMA. No AAA. No adenopathy. Reproductive: Status post hysterectomy. No adnexal masses. There are multiple  pelvic phleboliths. Other: There are small inguinal fat hernias. There are no incarcerated hernias. There is no free air, free hemorrhage or free fluid including around the pancreas. No abscess or further  focal acute process. Musculoskeletal: Moderate chronic compression fracture L1 vertebral body. Chronic degenerative grade 1 anterolisthesis L3-4 and L4-5 with levoscoliosis. Osteopenia and advanced degenerative changes. Severe acquired spinal stenosis L3-4 and L4-5. IMPRESSION: 1. CT findings of acute pancreatitis. Correlate with lipase and symptoms. 2. Cholelithiasis without evidence of acute cholecystitis. 3. Moderate hepatic steatosis. 4. Nonobstructive nephrolithiasis. 5. Aortic, branch vessel and coronary artery atherosclerosis. 6. Diverticulosis without evidence of diverticulitis. 7. Osteopenia, degenerative and chronic changes. 8. Severe chronic acquired spinal stenosis L3-4 and L4-5. Aortic Atherosclerosis (ICD10-I70.0). Electronically Signed   By: Almira Bar M.D.   On: 01/25/2023 23:32   DG Ankle Complete Right  Result Date: 01/25/2023 CLINICAL DATA:  fall EXAM: RIGHT ANKLE - COMPLETE 3+ VIEW COMPARISON:  X-ray right foot 12/12/2022 FINDINGS: There is no evidence of fracture, dislocation, or joint effusion. Posterior calcaneal spur. There is no evidence of arthropathy or other focal bone abnormality. Subcutaneus soft tissue edema. Redemonstration of chronic plantar punctate densities that may represent calcifications versus retained radiopaque foreign bodies. IMPRESSION: No acute displaced fracture or dislocation. Electronically Signed   By: Tish Frederickson M.D.   On: 01/25/2023 22:50   DG Chest Portable 1 View  Result Date: 01/25/2023 CLINICAL DATA:  CHF.  Shortness of breath EXAM: PORTABLE CHEST 1 VIEW COMPARISON:  10/27/2022 FINDINGS: Low volume chest with streaky density at the bases. Fullness of the right hilum which is chronic and vascular by recent chest CT. No effusion or pneumothorax. No pulmonary edema or air bronchogram. IMPRESSION: Mild atelectatic type opacity at the bases.  No pulmonary edema. Electronically Signed   By: Tiburcio Pea M.D.   On: 01/25/2023 21:26   CT HEAD WO  CONTRAST ( )  Result Date: 01/25/2023 CLINICAL DATA:  Head trauma, minor, normal mental status (Age 20-64y); Neck trauma (Age >= 65y) EXAM: CT HEAD WITHOUT CONTRAST CT CERVICAL SPINE WITHOUT CONTRAST TECHNIQUE: Multidetector CT imaging of the head and cervical spine was performed following the standard protocol without intravenous contrast. Multiplanar CT image reconstructions of the cervical spine were also generated. RADIATION DOSE REDUCTION: This exam was performed according to the departmental dose-optimization program which includes automated exposure control, adjustment of the mA and/or kV according to patient size and/or use of iterative reconstruction technique. COMPARISON:  CT head 01/22/2022 FINDINGS: CT HEAD FINDINGS Brain: Cerebral ventricle sizes are concordant with the degree of cerebral volume loss. Patchy and confluent areas of decreased attenuation are noted throughout the deep and periventricular white matter of the cerebral hemispheres bilaterally, compatible with chronic microvascular ischemic disease. No evidence of large-territorial acute infarction. No parenchymal hemorrhage. No mass lesion. No extra-axial collection. No mass effect or midline shift. No hydrocephalus. Basilar cisterns are patent. Vascular: No hyperdense vessel. Skull: No acute fracture or focal lesion. Sinuses/Orbits: Paranasal sinuses and mastoid air cells are clear. The orbits are unremarkable. Other: None. CT CERVICAL SPINE FINDINGS Alignment: Normal. Skull base and vertebrae: Diffusely decreased bone density. Multilevel moderate severe degenerative changes of the spine. Multilevel moderate to severe osseous neural foraminal stenosis. No severe osseous central canal stenosis. No acute fracture. No aggressive appearing focal osseous lesion or focal pathologic process. Soft tissues and spinal canal: No prevertebral fluid or swelling. No visible canal hematoma. Upper chest: Unremarkable. Other: Severe atherosclerotic  plaque of the carotid arteries within the neck. Retropharyngeal.  Carotid arteries. IMPRESSION: 1. No acute intracranial abnormality. 2. No acute displaced fracture or traumatic listhesis of the cervical spine. 3. Multilevel moderate severe degenerative changes of the spine. Multilevel moderate to severe osseous neural foraminal stenosis. 4. Severe atherosclerotic plaque of the carotid arteries within the neck. Electronically Signed   By: Tish Frederickson M.D.   On: 01/25/2023 19:36   CT Cervical Spine Wo Contrast  Result Date: 01/25/2023 CLINICAL DATA:  Head trauma, minor, normal mental status (Age 52-64y); Neck trauma (Age >= 65y) EXAM: CT HEAD WITHOUT CONTRAST CT CERVICAL SPINE WITHOUT CONTRAST TECHNIQUE: Multidetector CT imaging of the head and cervical spine was performed following the standard protocol without intravenous contrast. Multiplanar CT image reconstructions of the cervical spine were also generated. RADIATION DOSE REDUCTION: This exam was performed according to the departmental dose-optimization program which includes automated exposure control, adjustment of the mA and/or kV according to patient size and/or use of iterative reconstruction technique. COMPARISON:  CT head 01/22/2022 FINDINGS: CT HEAD FINDINGS Brain: Cerebral ventricle sizes are concordant with the degree of cerebral volume loss. Patchy and confluent areas of decreased attenuation are noted throughout the deep and periventricular white matter of the cerebral hemispheres bilaterally, compatible with chronic microvascular ischemic disease. No evidence of large-territorial acute infarction. No parenchymal hemorrhage. No mass lesion. No extra-axial collection. No mass effect or midline shift. No hydrocephalus. Basilar cisterns are patent. Vascular: No hyperdense vessel. Skull: No acute fracture or focal lesion. Sinuses/Orbits: Paranasal sinuses and mastoid air cells are clear. The orbits are unremarkable. Other: None. CT CERVICAL SPINE  FINDINGS Alignment: Normal. Skull base and vertebrae: Diffusely decreased bone density. Multilevel moderate severe degenerative changes of the spine. Multilevel moderate to severe osseous neural foraminal stenosis. No severe osseous central canal stenosis. No acute fracture. No aggressive appearing focal osseous lesion or focal pathologic process. Soft tissues and spinal canal: No prevertebral fluid or swelling. No visible canal hematoma. Upper chest: Unremarkable. Other: Severe atherosclerotic plaque of the carotid arteries within the neck. Retropharyngeal. Carotid arteries. IMPRESSION: 1. No acute intracranial abnormality. 2. No acute displaced fracture or traumatic listhesis of the cervical spine. 3. Multilevel moderate severe degenerative changes of the spine. Multilevel moderate to severe osseous neural foraminal stenosis. 4. Severe atherosclerotic plaque of the carotid arteries within the neck. Electronically Signed   By: Tish Frederickson M.D.   On: 01/25/2023 19:36    Pertinent labs & imaging results that were available during my care of the patient were reviewed by me and considered in my medical decision making (see MDM for details).  Medications Ordered in ED Medications  0.9 %  sodium chloride infusion (has no administration in time range)  sodium chloride 0.9 % bolus 1,000 mL (1,000 mLs Intravenous New Bag/Given 01/25/23 2322)  sodium zirconium cyclosilicate (LOKELMA) packet 10 g (10 g Oral Given 01/25/23 2319)  ondansetron (ZOFRAN) injection 4 mg (4 mg Intravenous Given 01/25/23 2319)  cefTRIAXone (ROCEPHIN) 1 g in sodium chloride 0.9 % 100 mL IVPB (1 g Intravenous New Bag/Given 01/25/23 2323)  Procedures .Critical Care  Performed by: Sloan Leiter, DO Authorized by: Sloan Leiter, DO   Critical care provider statement:    Critical care time (minutes):  78    Critical care time was exclusive of:  Separately billable procedures and treating other patients   Critical care was necessary to treat or prevent imminent or life-threatening deterioration of the following conditions:  Metabolic crisis and renal failure   Critical care was time spent personally by me on the following activities:  Development of treatment plan with patient or surrogate, discussions with consultants, evaluation of patient's response to treatment, examination of patient, ordering and review of laboratory studies, ordering and review of radiographic studies, ordering and performing treatments and interventions, pulse oximetry, re-evaluation of patient's condition and review of old charts   Care discussed with: admitting provider     (including critical care time)  Medical Decision Making / ED Course    Medical Decision Making:    JOSUE KASS is a 89 y.o. female  with past medical history as below, significant for CVA, HTN, CVA, ataxia, CKD who presents to the ED with complaint of weakness, fall.. The complaint involves an extensive differential diagnosis and also carries with it a high risk of complications and morbidity.  Serious etiology was considered. Ddx includes but is not limited to: Differential diagnoses for head trauma includes subdural hematoma, epidural hematoma, acute concussion, traumatic subarachnoid hemorrhage, cerebral contusions, etc. Differential diagnosis includes but is not exclusive to ectopic pregnancy, ovarian cyst, ovarian torsion, acute appendicitis, urinary tract infection, endometriosis, bowel obstruction, hernia, colitis, renal colic, gastroenteritis, volvulus etc.   Complete initial physical exam performed, notably the patient  was no acute distress, resting comfortably, mucous membranes are dry.  Neuroexam is nonfocal.  Speaking at baseline per family at bedside.    Reviewed and confirmed nursing documentation for past medical history, family history,  social history.  Vital signs reviewed.    Clinical Course as of 01/26/23 0000  Wed Jan 25, 2023  2224 Creatinine(!): 2.74 Baseline approximately 1.7 [SG]  2224 CO2(!): 14 This was 20 eight days ago [SG]  2225 BUN(!): 128 baseline around 80, no confusion reported by family. [SG]  2338 CTAP : "IMPRESSION: 1. CT findings of acute pancreatitis. Correlate with lipase and symptoms. 2. Cholelithiasis without evidence of acute cholecystitis. 3. Moderate hepatic steatosis. 4. Nonobstructive nephrolithiasis. 5. Aortic, branch vessel and coronary artery atherosclerosis. 6. Diverticulosis without evidence of diverticulitis. 7. Osteopenia, degenerative and chronic changes. 8. Severe chronic acquired spinal stenosis L3-4 and L4-5." [SG]  2353 Patient with poor p.o. intake over the last few days, increased weakness.  No confusion or mentation changes per family at bedside.  She has acute on chronic kidney injury, concurrent uremia without mental status change, her LFTs are also elevated.  CT imaging concerning for pancreatitis, no cholecystitis on imaging, noted no right upper quadrant pain.  Unclear etiology of her pancreatitis.  Temp is low, placed on bair hugger.  She has leukocytosis.  No clear source of infection at this time. Family reports frequent UTI. Leg wounds do not appear to be infected. Given empiric rocephin.  [SG]    Clinical Course User Index [SG] Sloan Leiter, DO     Additional history obtained: -Additional history obtained from family -External records from outside source obtained and reviewed including: Chart review including previous notes, labs, imaging, consultation notes including labs and imaging, home medications, prior ED visits, primary care documentation   Lab Tests: -I ordered,  reviewed, and interpreted labs.   The pertinent results include:   Labs Reviewed  CBC WITH DIFFERENTIAL/PLATELET - Abnormal; Notable for the following components:      Result Value    WBC 12.9 (*)    Hemoglobin 11.9 (*)    MCHC 29.7 (*)    RDW 17.2 (*)    nRBC 2.5 (*)    Neutro Abs 11.7 (*)    All other components within normal limits  COMPREHENSIVE METABOLIC PANEL - Abnormal; Notable for the following components:   Potassium 5.8 (*)    CO2 14 (*)    Glucose, Bld 119 (*)    BUN 128 (*)    Creatinine, Ser 2.74 (*)    Calcium 8.5 (*)    Total Protein 6.4 (*)    Albumin 3.1 (*)    AST 176 (*)    ALT 158 (*)    Alkaline Phosphatase 171 (*)    GFR, Estimated 15 (*)    All other components within normal limits  BRAIN NATRIURETIC PEPTIDE - Abnormal; Notable for the following components:   B Natriuretic Peptide 258.9 (*)    All other components within normal limits  BLOOD GAS, VENOUS - Abnormal; Notable for the following components:   pH, Ven 7.22 (*)    pCO2, Ven 38 (*)    pO2, Ven 47 (*)    Bicarbonate 15.6 (*)    Acid-base deficit 11.4 (*)    All other components within normal limits  CULTURE, BLOOD (ROUTINE X 2)  CULTURE, BLOOD (ROUTINE X 2)  PROTIME-INR  URINALYSIS, ROUTINE W REFLEX MICROSCOPIC  AMMONIA  SALICYLATE LEVEL  ACETAMINOPHEN LEVEL  LACTIC ACID, PLASMA  LACTIC ACID, PLASMA  LIPASE, BLOOD    Notable for as above  EKG   EKG Interpretation  Date/Time:    Ventricular Rate:    PR Interval:    QRS Duration:   QT Interval:    QTC Calculation:   R Axis:     Text Interpretation:           Imaging Studies ordered: I ordered imaging studies including CT head,abdomen/pelvis, CXR ankle xr I independently visualized the following imaging with scope of interpretation limited to determining acute life threatening conditions related to emergency care; findings noted above, significant for as above, pancreatitis, diverticulosis, hepatic steatosis I independently visualized and interpreted imaging. I agree with the radiologist interpretation   Medicines ordered and prescription drug management: Meds ordered this encounter  Medications    sodium chloride 0.9 % bolus 1,000 mL   sodium zirconium cyclosilicate (LOKELMA) packet 10 g   ondansetron (ZOFRAN) injection 4 mg   cefTRIAXone (ROCEPHIN) 1 g in sodium chloride 0.9 % 100 mL IVPB    Order Specific Question:   Antibiotic Indication:    Answer:   Intra-abdominal   0.9 %  sodium chloride infusion    -I have reviewed the patients home medicines and have made adjustments as needed   Consultations Obtained: na   Cardiac Monitoring: The patient was maintained on a cardiac monitor.  I personally viewed and interpreted the cardiac monitored which showed an underlying rhythm of: NSR  Social Determinants of Health:  Diagnosis or treatment significantly limited by social determinants of health: na   Reevaluation: After the interventions noted above, I reevaluated the patient and found that they have improved  Co morbidities that complicate the patient evaluation  Past Medical History:  Diagnosis Date   Anemia    Arthritis    Cerebrovascular accident (CVA)  due to bilateral embolism of vertebral arteries    Fall at home 09/26/2012   Glaucoma    OU   Gout, joint    Hypertension    Hypertensive retinopathy    OU   Macular degeneration    OU   NSTEMI (non-ST elevated myocardial infarction) 08/2015   Stroke       Dispostion: Disposition decision including need for hospitalization was considered, and patient {[id number]::"discharged from emergency department."}    Final Clinical Impression(s) / ED Diagnoses Final diagnoses:  Weakness  Uremia  Acute renal failure superimposed on chronic kidney disease, unspecified acute renal failure type, unspecified CKD stage  Injury of head, initial encounter  Diverticulosis  Acute pancreatitis, unspecified complication status, unspecified pancreatitis type  Dehydration     This chart was dictated using voice recognition software.  Despite best efforts to proofread,  errors can occur which can change the documentation  meaning.

## 2023-01-25 NOTE — ED Triage Notes (Addendum)
Patient is here for evaluation after falling today. PCP wanted her to be seen due to her hitting her head and a hematoma noted to the left side of head above ear. Per family, patient only takes a baby ASA daily. Reports that she is at her baseline mentation status after the fall. Family reports that she has not been eating much lately. Family also reports patient is having leaking from bilateral legs and arms.

## 2023-01-25 NOTE — ED Provider Triage Note (Signed)
Emergency Medicine Provider Triage Evaluation Note  [name] , a 89 y.o. female  was evaluated in triage.  Pt complains of fall x 1pm today.  Patient reports she suffered a fall today struck her head was evaluated by PCP but sent here for scans due to left temporal hematoma.  She is currently on no blood thinners and takes a baby aspirin.  According to family ember at the bedside, she has had ongoing diarrhea for the last couple days and she was placed on antibiotics.  She is somewhat weaker than before.  Review of Systems  Positive: Head trauma, leg swelling, weakness Negative: Chest pain, dizzy, sob  Physical Exam  BP 120/74   Pulse (!) 59   Resp 16   Ht  (1.499 m)   Wt 62.1 kg   SpO2 98%   BMI 27.65 kg/m  Gen:   Awake, no distress   Resp:  Normal effort  MSK:   Moves extremities without difficulty  Other:  BLLE wrapped and bandages 2+ pitting edema. Small hematoma to the left temporal area. Bruising throughout left upper arm  Medical Decision Making  Medically screening exam initiated at 7:05 PM.  Appropriate orders placed.  Casimer Leek was informed that the remainder of the evaluation will be completed by another provider, this initial triage assessment does not replace that evaluation, and the importance of remaining in the ED until their evaluation is complete.     Claude Manges, PA-C 01/25/23 1913

## 2023-01-25 NOTE — ED Notes (Signed)
Attempted to obtain clean catch specimen, contaminated with stool.

## 2023-01-25 NOTE — Progress Notes (Unsigned)
  Care Coordination Note  01/25/2023 Name: CAPUCINE TRYON MRN: [id number] DOB: Nov 03, 1922  Casimer Leek is a 89 y.o. year old female who is a primary care patient of Irena Reichmann, DO and is actively engaged with the care management team. I reached out to Casimer Leek by phone today to assist with re-scheduling an initial visit with the RN Case Manager  Follow up plan: Unsuccessful telephone outreach attempt made. A HIPAA compliant phone message was left for the patient providing contact information and requesting a return call.   [facility]  Care Coordination Care Guide  Direct Dial: [phone]

## 2023-01-26 ENCOUNTER — Encounter (HOSPITAL_COMMUNITY): Payer: Self-pay | Admitting: Family Medicine

## 2023-01-26 ENCOUNTER — Ambulatory Visit: Payer: Medicare Other | Admitting: General Practice

## 2023-01-26 ENCOUNTER — Inpatient Hospital Stay (HOSPITAL_COMMUNITY): Payer: Medicare Other

## 2023-01-26 DIAGNOSIS — T3695XA Adverse effect of unspecified systemic antibiotic, initial encounter: Secondary | ICD-10-CM | POA: Diagnosis present

## 2023-01-26 DIAGNOSIS — Z955 Presence of coronary angioplasty implant and graft: Secondary | ICD-10-CM | POA: Diagnosis not present

## 2023-01-26 DIAGNOSIS — N184 Chronic kidney disease, stage 4 (severe): Secondary | ICD-10-CM | POA: Diagnosis present

## 2023-01-26 DIAGNOSIS — Z515 Encounter for palliative care: Secondary | ICD-10-CM | POA: Diagnosis not present

## 2023-01-26 DIAGNOSIS — N189 Chronic kidney disease, unspecified: Secondary | ICD-10-CM | POA: Diagnosis present

## 2023-01-26 DIAGNOSIS — I252 Old myocardial infarction: Secondary | ICD-10-CM | POA: Diagnosis not present

## 2023-01-26 DIAGNOSIS — E872 Acidosis, unspecified: Secondary | ICD-10-CM | POA: Diagnosis present

## 2023-01-26 DIAGNOSIS — R7401 Elevation of levels of liver transaminase levels: Secondary | ICD-10-CM | POA: Diagnosis present

## 2023-01-26 DIAGNOSIS — N179 Acute kidney failure, unspecified: Secondary | ICD-10-CM | POA: Diagnosis present

## 2023-01-26 DIAGNOSIS — M868X7 Other osteomyelitis, ankle and foot: Secondary | ICD-10-CM | POA: Diagnosis present

## 2023-01-26 DIAGNOSIS — E162 Hypoglycemia, unspecified: Secondary | ICD-10-CM | POA: Diagnosis not present

## 2023-01-26 DIAGNOSIS — Z8673 Personal history of transient ischemic attack (TIA), and cerebral infarction without residual deficits: Secondary | ICD-10-CM | POA: Diagnosis not present

## 2023-01-26 DIAGNOSIS — Z7189 Other specified counseling: Secondary | ICD-10-CM | POA: Diagnosis not present

## 2023-01-26 DIAGNOSIS — H35039 Hypertensive retinopathy, unspecified eye: Secondary | ICD-10-CM | POA: Diagnosis present

## 2023-01-26 DIAGNOSIS — T502X5A Adverse effect of carbonic-anhydrase inhibitors, benzothiadiazides and other diuretics, initial encounter: Secondary | ICD-10-CM | POA: Diagnosis present

## 2023-01-26 DIAGNOSIS — I130 Hypertensive heart and chronic kidney disease with heart failure and stage 1 through stage 4 chronic kidney disease, or unspecified chronic kidney disease: Secondary | ICD-10-CM | POA: Diagnosis present

## 2023-01-26 DIAGNOSIS — R680 Hypothermia, not associated with low environmental temperature: Secondary | ICD-10-CM | POA: Diagnosis present

## 2023-01-26 DIAGNOSIS — E860 Dehydration: Secondary | ICD-10-CM | POA: Diagnosis present

## 2023-01-26 DIAGNOSIS — E875 Hyperkalemia: Secondary | ICD-10-CM | POA: Diagnosis present

## 2023-01-26 DIAGNOSIS — L89616 Pressure-induced deep tissue damage of right heel: Secondary | ICD-10-CM | POA: Diagnosis present

## 2023-01-26 DIAGNOSIS — K521 Toxic gastroenteritis and colitis: Secondary | ICD-10-CM | POA: Diagnosis present

## 2023-01-26 DIAGNOSIS — N19 Unspecified kidney failure: Secondary | ICD-10-CM | POA: Diagnosis present

## 2023-01-26 DIAGNOSIS — K760 Fatty (change of) liver, not elsewhere classified: Secondary | ICD-10-CM | POA: Diagnosis present

## 2023-01-26 DIAGNOSIS — I2510 Atherosclerotic heart disease of native coronary artery without angina pectoris: Secondary | ICD-10-CM | POA: Diagnosis present

## 2023-01-26 DIAGNOSIS — K859 Acute pancreatitis without necrosis or infection, unspecified: Secondary | ICD-10-CM | POA: Diagnosis present

## 2023-01-26 DIAGNOSIS — R531 Weakness: Secondary | ICD-10-CM | POA: Diagnosis not present

## 2023-01-26 DIAGNOSIS — Z66 Do not resuscitate: Secondary | ICD-10-CM | POA: Diagnosis present

## 2023-01-26 DIAGNOSIS — W19XXXA Unspecified fall, initial encounter: Secondary | ICD-10-CM | POA: Diagnosis present

## 2023-01-26 DIAGNOSIS — R9431 Abnormal electrocardiogram [ECG] [EKG]: Secondary | ICD-10-CM | POA: Diagnosis present

## 2023-01-26 DIAGNOSIS — I5032 Chronic diastolic (congestive) heart failure: Secondary | ICD-10-CM | POA: Diagnosis present

## 2023-01-26 DIAGNOSIS — Z8249 Family history of ischemic heart disease and other diseases of the circulatory system: Secondary | ICD-10-CM | POA: Diagnosis not present

## 2023-01-26 LAB — URINALYSIS, ROUTINE W REFLEX MICROSCOPIC
Bilirubin Urine: NEGATIVE
Glucose, UA: NEGATIVE mg/dL
Ketones, ur: NEGATIVE mg/dL
Nitrite: NEGATIVE
Protein, ur: NEGATIVE mg/dL
Specific Gravity, Urine: 1.01 (ref 1.005–1.030)
WBC, UA: >50 WBC/hpf (ref 0–5)
pH: 5 (ref 5.0–8.0)

## 2023-01-26 LAB — CBC
HCT: 35.2 % — ABNORMAL LOW (ref 36.0–46.0)
Hemoglobin: 10.8 g/dL — ABNORMAL LOW (ref 12.0–15.0)
MCH: 26.6 pg (ref 26.0–34.0)
MCHC: 30.7 g/dL (ref 30.0–36.0)
MCV: 86.7 fL (ref 80.0–100.0)
Platelets: 200 10*3/uL (ref 150–400)
RBC: 4.06 MIL/uL (ref 3.87–5.11)
RDW: 17.1 % — ABNORMAL HIGH (ref 11.5–15.5)
WBC: 8.7 10*3/uL (ref 4.0–10.5)
nRBC: 3 % — ABNORMAL HIGH (ref 0.0–0.2)

## 2023-01-26 LAB — COMPREHENSIVE METABOLIC PANEL
ALT: 144 U/L — ABNORMAL HIGH (ref 0–44)
ALT: 164 U/L — ABNORMAL HIGH (ref 0–44)
AST: 151 U/L — ABNORMAL HIGH (ref 15–41)
AST: 204 U/L — ABNORMAL HIGH (ref 15–41)
Albumin: 2.6 g/dL — ABNORMAL LOW (ref 3.5–5.0)
Albumin: 2.6 g/dL — ABNORMAL LOW (ref 3.5–5.0)
Alkaline Phosphatase: 157 U/L — ABNORMAL HIGH (ref 38–126)
Alkaline Phosphatase: 167 U/L — ABNORMAL HIGH (ref 38–126)
Anion gap: 11 (ref 5–15)
Anion gap: 11 (ref 5–15)
BUN: 125 mg/dL — ABNORMAL HIGH (ref 8–23)
BUN: 118 mg/dL — ABNORMAL HIGH (ref 8–23)
CO2: 17 mmol/L — ABNORMAL LOW (ref 22–32)
CO2: 21 mmol/L — ABNORMAL LOW (ref 22–32)
Calcium: 7.8 mg/dL — ABNORMAL LOW (ref 8.9–10.3)
Calcium: 7.3 mg/dL — ABNORMAL LOW (ref 8.9–10.3)
Chloride: 106 mmol/L (ref 98–111)
Chloride: 100 mmol/L (ref 98–111)
Creatinine, Ser: 2.65 mg/dL — ABNORMAL HIGH (ref 0.44–1.00)
Creatinine, Ser: 2.72 mg/dL — ABNORMAL HIGH (ref 0.44–1.00)
GFR, Estimated: 16 mL/min — ABNORMAL LOW (ref >60)
GFR, Estimated: 15 mL/min — ABNORMAL LOW (ref >60)
Glucose, Bld: 108 mg/dL — ABNORMAL HIGH (ref 70–99)
Glucose, Bld: 151 mg/dL — ABNORMAL HIGH (ref 70–99)
Potassium: 5.2 mmol/L — ABNORMAL HIGH (ref 3.5–5.1)
Potassium: 4.8 mmol/L (ref 3.5–5.1)
Sodium: 134 mmol/L — ABNORMAL LOW (ref 135–145)
Sodium: 132 mmol/L — ABNORMAL LOW (ref 135–145)
Total Bilirubin: 0.7 mg/dL (ref 0.3–1.2)
Total Bilirubin: 0.7 mg/dL (ref 0.3–1.2)
Total Protein: 5.5 g/dL — ABNORMAL LOW (ref 6.5–8.1)
Total Protein: 5.5 g/dL — ABNORMAL LOW (ref 6.5–8.1)

## 2023-01-26 LAB — AMMONIA: Ammonia: 13 umol/L (ref 9–35)

## 2023-01-26 LAB — LACTIC ACID, PLASMA: Lactic Acid, Venous: 1.2 mmol/L (ref 0.5–1.9)

## 2023-01-26 LAB — MAGNESIUM: Magnesium: 1.9 mg/dL (ref 1.7–2.4)

## 2023-01-26 LAB — LIPASE, BLOOD: Lipase: 361 U/L — ABNORMAL HIGH (ref 11–51)

## 2023-01-26 LAB — SALICYLATE LEVEL: Salicylate Lvl: <7 mg/dL — ABNORMAL LOW (ref 7.0–30.0)

## 2023-01-26 LAB — TSH: TSH: 0.137 u[IU]/mL — ABNORMAL LOW (ref 0.350–4.500)

## 2023-01-26 LAB — ACETAMINOPHEN LEVEL: Acetaminophen (Tylenol), Serum: 23 ug/mL (ref 10–30)

## 2023-01-26 LAB — CORTISOL
Cortisol, Plasma: 5.7 ug/dL
Cortisol, Plasma: 4.6 ug/dL

## 2023-01-26 LAB — PHOSPHORUS: Phosphorus: 7.6 mg/dL — ABNORMAL HIGH (ref 2.5–4.6)

## 2023-01-26 LAB — TRIGLYCERIDES: Triglycerides: 41 mg/dL (ref <150)

## 2023-01-26 MED ORDER — STERILE WATER FOR INJECTION IV SOLN
INTRAVENOUS | Status: AC
  Filled 2023-01-26: qty 1000
  Filled 2023-01-26: qty 150

## 2023-01-26 MED ORDER — ACETAMINOPHEN 650 MG RE SUPP: 650.0000 mg | Freq: Four times a day (QID) | RECTAL | Status: DC | PRN

## 2023-01-26 MED ORDER — BENZONATATE 100 MG PO CAPS: 100.0000 mg | ORAL_CAPSULE | Freq: Three times a day (TID) | ORAL | Status: DC | PRN

## 2023-01-26 MED ORDER — SODIUM CHLORIDE (HYPERTONIC) 5 % OP SOLN
1.0000 [drp] | Freq: Four times a day (QID) | OPHTHALMIC | Status: DC
  Administered 2023-01-26 – 2023-01-27 (×4): 1 [drp] via OPHTHALMIC
  Filled 2023-01-26 (×2): qty 15

## 2023-01-26 MED ORDER — BRIMONIDINE TARTRATE 0.2 % OP SOLN
1.0000 [drp] | Freq: Two times a day (BID) | OPHTHALMIC | Status: DC
  Administered 2023-01-27 (×3): 1 [drp] via OPHTHALMIC
  Filled 2023-01-26 (×2): qty 5

## 2023-01-26 MED ORDER — SODIUM CHLORIDE 0.9% FLUSH
3.0000 mL | Freq: Two times a day (BID) | INTRAVENOUS | Status: DC
  Administered 2023-01-27: 3 mL via INTRAVENOUS

## 2023-01-26 MED ORDER — MIRABEGRON ER 25 MG PO TB24
25.0000 mg | ORAL_TABLET | Freq: Every day | ORAL | Status: DC
  Filled 2023-01-26: qty 1

## 2023-01-26 MED ORDER — ACETAMINOPHEN 325 MG PO TABS: 650.0000 mg | ORAL_TABLET | Freq: Four times a day (QID) | ORAL | Status: DC | PRN

## 2023-01-26 MED ORDER — LOTEPREDNOL ETABONATE 0.38 % OP GEL: 1.0000 [drp] | Freq: Two times a day (BID) | OPHTHALMIC | Status: DC

## 2023-01-26 MED ORDER — MINOCYCLINE HCL 50 MG PO CAPS
200.0000 mg | ORAL_CAPSULE | Freq: Two times a day (BID) | ORAL | Status: DC
  Filled 2023-01-26: qty 2

## 2023-01-26 MED ORDER — MINOCYCLINE HCL 50 MG PO CAPS
200.0000 mg | ORAL_CAPSULE | Freq: Two times a day (BID) | ORAL | Status: DC
  Administered 2023-01-26: 200 mg via ORAL
  Filled 2023-01-26 (×6): qty 4

## 2023-01-26 MED ORDER — ASPIRIN 81 MG PO CHEW
81.0000 mg | CHEWABLE_TABLET | Freq: Every day | ORAL | Status: DC
  Administered 2023-01-26: 81 mg via ORAL
  Filled 2023-01-26 (×2): qty 1

## 2023-01-26 NOTE — ED Provider Notes (Signed)
I have discussed the case with Dr. Antionette Char of [facility], who agrees to admit the patient.   Dione Booze, MD 01/26/23 [phone]

## 2023-01-26 NOTE — H&P (Signed)
History and Physical    [name] ZOX:[id number] DOB: 1923/08/25 DOA: 01/25/2023  PCP: Irena Reichmann, DO   Patient coming from: Home   Chief Complaint: Fall, loss of appetite, nausea   HPI: [name] is a 89 y.o. female with medical history significant for hypertension, CAD, CVA, CKD stage IV, osteomyelitis of the right foot who presents to the emergency department at after a fall in the setting of recent loss of appetite and nausea.  Patient is accompanied by her daughter who assists with the history.  Patient reportedly had her Lasix increased approximately 2 weeks ago due to weight gain, and was then started on antibiotics for right foot osteomyelitis 1 week ago.  She developed diarrhea shortly after beginning the antibiotic.  Diarrhea has slowed yesterday after antibiotic was held but the patient has had persistent nausea and loss of appetite.  Patient's daughter noted that patient's abdomen seemed to be tender when she was helping to change her clothes yesterday, though the patient denied abdominal pain when asked.  ED Course: Upon arrival to the ED, patient is found to have a rectal temperature of 34.6 C low-normal heart rate and stable blood pressure.  EKG demonstrates sinus rhythm with RBBB and prolonged QT interval.  Chemistry panel is most notable for BUN 128, potassium 5.8, bicarbonate 14, creatinine 2.74, AST 176, and ALT 158.  CBC notable for leukocytosis of 12,900 and clumped platelets.  BNP is elevated to 259.  Blood culture was collected in the emergency department and the patient was given 1 L of normal saline, Lokelma, and Rocephin in the ED.  Review of Systems:  All other systems reviewed and apart from HPI, are negative.  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    Fall at home 09/26/2012   Glaucoma    OU   Gout, joint    Hypertension    Hypertensive  retinopathy    OU   Macular degeneration    OU   NSTEMI (non-ST elevated myocardial infarction) 08/2015   Stroke     Past Surgical History:  Procedure Laterality Date   ABDOMINAL HYSTERECTOMY     BREAST SURGERY     CYST   CARDIAC CATHETERIZATION N/A 08/18/2015   Procedure: Left Heart Cath and Coronary Angiography;  Surgeon: Lennette Bihari, MD;  Location: [facility];  Service: Cardiovascular;  Laterality: N/A;   CARDIAC CATHETERIZATION  08/18/2015   Procedure: Coronary Stent Intervention;  Surgeon: Lennette Bihari, MD;  Location: [facility];  Service: Cardiovascular;;   CATARACT EXTRACTION Bilateral    DILATION AND CURETTAGE OF UTERUS     EYE SURGERY Bilateral    HERNIA REPAIR     IRIDOTOMY / IRIDECTOMY Right    JOINT REPLACEMENT  left knee   REFRACTIVE SURGERY Right 2017    Social History:   reports that she has never smoked. She has never used smokeless tobacco. She reports that she does not drink alcohol and does not use drugs.  Allergies  Allergen Reactions   Meloxicam Hives   Nitrofurantoin    Other Other (See Comments)   Guaifenesin Er Rash    Other reaction(s): rash   Nitrofuran Derivatives Rash    Family History  Problem Relation Age of Onset   Heart disease Mother    Stroke Mother      Prior to Admission medications  Medication Sig Start Date End Date Taking? Authorizing Provider  Acetaminophen 325 MG CAPS Take 325 mg by mouth 3 (three) times daily as needed (pain).    [provider]  amLODipine (NORVASC) 2.5 MG tablet Take 1 tablet (2.5 mg total) by mouth daily. 10/31/22   Rodolph Bong, MD  aspirin 81 MG chewable tablet Chew 81 mg by mouth daily.    [provider]  atorvastatin (LIPITOR) 80 MG tablet Take 1 tablet (80 mg total) by mouth daily at 6 PM. 08/20/15   Ghimire, Werner Lean, MD  benzonatate (TESSALON) 100 MG capsule Take 1 capsule (100 mg total) by mouth 3 (three) times daily as needed for cough. 10/31/22    Rodolph Bong, MD  brimonidine (ALPHAGAN) 0.2 % ophthalmic solution Place 1 drop into both eyes in the morning and at bedtime.    [provider]  Calcium Carbonate-Vitamin D (CALTRATE 600+D PO) Take 1 tablet by mouth daily. gummy    [provider]  camphor-menthol Wynelle Fanny) lotion Apply topically as needed for itching. 10/31/22   Rodolph Bong, MD  chlorpheniramine (CHLOR-TRIMETON) 4 MG tablet Take 4 mg by mouth daily.    [provider]  Cranberry Extract 250 MG TABS as directed Orally    [provider]  [facility] SOD CHLORIDE HYPERTONICITY 5 % ophthalmic ointment PLACE 1 APPLICATION INTO THE RIGHT EYE AT BEDTIME. Patient taking differently: Place 1 Application into the right eye at bedtime. 07/01/22   Rennis Chris, MD  Cyanocobalamin 1000 MCG TBCR 1 tablet Orally Once a day for 30 day(s)    [provider]  denosumab (PROLIA) 60 MG/ML SOSY injection 60 mg See admin instructions. 12/18/17   [provider]  diclofenac Sodium (VOLTAREN) 1 % GEL Apply 2 g topically 4 (four) times daily. Apply to Knee 10/31/22   Rodolph Bong, MD  famotidine (PEPCID) 20 MG tablet Take 20 mg by mouth daily.    [provider]  FLUZONE HIGH-DOSE QUADRIVALENT 0.7 ML SUSY  07/14/19   [provider]  furosemide (LASIX) 40 MG tablet Take 20 mg by mouth daily. MONDAY , [facility] and FRIDAY 12/24/21   [provider]  GEMTESA 75 MG TABS Take 75 mg by mouth daily. 10/15/21   [provider]  gentamicin cream (GARAMYCIN) 0.1 % Apply 1 application topically 2 (two) times daily. 11/29/21   Felecia Shelling, DPM  Loteprednol Etabonate (LOTEMAX SM) 0.38 % GEL Place 1 drop into the right eye 2 (two) times daily. 07/01/22   Rennis Chris, MD  Menthol, Topical Analgesic, (BIOFREEZE) 4 % GEL Apply 1 Application topically daily as needed (shoulder pain).    [provider]  metoCLOPramide (REGLAN) 5 MG tablet Take 1 tablet (5 mg total) by  mouth 2 (two) times daily as needed for nausea. 01/25/23   Blanchard Kelch, NP  metoprolol tartrate (LOPRESSOR) 25 MG tablet Take 0.5 tablets (12.5 mg total) by mouth 2 (two) times daily. 10/31/22   Rodolph Bong, MD  minocycline (MINOCIN) 100 MG capsule Take 2 capsules (200 mg total) by mouth 2 (two) times daily. 01/12/23 02/23/23  Kathlynn Grate, DO  PFIZER-BIONT COVID-19 VAC-TRIS SUSP injection  05/08/21   [provider]  Polyethyl Glycol-Propyl Glycol (SYSTANE OP) Apply 1 drop to eye 2 (two) times daily as needed (dry eyes).    [provider]  potassium chloride SA (KLOR-CON M) 20 MEQ tablet Take 1 tablet (20 mEq total) by mouth daily. 01/11/23  Ronney Asters, NP  sodium chloride (MURO 128) 5 % ophthalmic solution INSTILL 1 DROP INTO RIGHT EYE 4 TIMES A DAY 08/17/22   Rennis Chris, MD  timolol (TIMOPTIC) 0.5 % ophthalmic solution Place 1 drop into both eyes every morning. 11/01/21   [provider]    Physical Exam: Vitals:   01/25/23 1858 01/25/23 1900 01/25/23 2215 01/25/23 2237  BP:  129/69 (!) 144/62   Pulse:  (!) 51 60   Resp:  17 16   Temp:    (!) 94.2 F (34.6 C)  TempSrc:    Rectal  SpO2:  97% 100%   Weight: 62.1 kg     Height: 4\' 11"  (1.499 m)        Constitutional: NAD, no pallor or diaphoresis  Eyes: PERTLA, lids and conjunctivae normal ENMT: Mucous membranes are moist. Posterior pharynx clear of any exudate or lesions.   Neck: supple, no masses  Respiratory: no wheezing, no crackles. No accessory muscle use.  Cardiovascular: S1 & S2 heard, regular rate and rhythm. B/l LE edema.  Abdomen: No distension, no tenderness, soft. Bowel sounds active.  Musculoskeletal: no clubbing / cyanosis. No joint deformity upper and lower extremities.   Skin: Right foot ulcer. Serous weeping from extremities. Warm, dry, well-perfused. Neurologic: Gross hearing deficit. Moving all extremities. Sleeping, wakes to voice and oriented to person, place, and  situation.  Psychiatric: Calm. Cooperative.    Labs and Imaging on Admission: I have personally reviewed following labs and imaging studies  CBC: Recent Labs  Lab 01/25/23 2002  WBC 12.9*  NEUTROABS 11.7*  HGB 11.9*  HCT 40.1  MCV 90.7  PLT PLATELET CLUMPS NOTED ON SMEAR, UNABLE TO ESTIMATE   Basic Metabolic Panel: Recent Labs  Lab 01/25/23 2002  NA 135  K 5.8*  CL 108  CO2 14*  GLUCOSE 119*  BUN 128*  CREATININE 2.74*  CALCIUM 8.5*   GFR: Estimated Creatinine Clearance: 9.2 mL/min (A) (by C-G formula based on SCr of 2.74 mg/dL (H)). Liver Function Tests: Recent Labs  Lab 01/25/23 2002  AST 176*  ALT 158*  ALKPHOS 171*  BILITOT 0.8  PROT 6.4*  ALBUMIN 3.1*   Recent Labs  Lab 01/25/23 2002  LIPASE 361*   Recent Labs  Lab 01/25/23 2318  AMMONIA 13   Coagulation Profile: Recent Labs  Lab 01/25/23 2318  INR 1.0   Cardiac Enzymes: No results for input(s): "CKTOTAL", "CKMB", "CKMBINDEX", "TROPONINI" in the last 168 hours. BNP (last 3 results) No results for input(s): "PROBNP" in the last 8760 hours. HbA1C: No results for input(s): "HGBA1C" in the last 72 hours. CBG: No results for input(s): "GLUCAP" in the last 168 hours. Lipid Profile: No results for input(s): "CHOL", "HDL", "LDLCALC", "TRIG", "CHOLHDL", "LDLDIRECT" in the last 72 hours. Thyroid Function Tests: No results for input(s): "TSH", "T4TOTAL", "FREET4", "T3FREE", "THYROIDAB" in the last 72 hours. Anemia Panel: No results for input(s): "VITAMINB12", "FOLATE", "FERRITIN", "TIBC", "IRON", "RETICCTPCT" in the last 72 hours. Urine analysis:    Component Value Date/Time   COLORURINE YELLOW 10/21/2022 1653   APPEARANCEUR CLEAR 10/21/2022 1653   LABSPEC 1.021 10/21/2022 1653   PHURINE 5.0 10/21/2022 1653   GLUCOSEU NEGATIVE 10/21/2022 1653   HGBUR NEGATIVE 10/21/2022 1653   BILIRUBINUR NEGATIVE 10/21/2022 1653   KETONESUR NEGATIVE 10/21/2022 1653   PROTEINUR 30 (A) 10/21/2022 1653    UROBILINOGEN 0.2 09/21/2012 1355   NITRITE NEGATIVE 10/21/2022 1653   LEUKOCYTESUR MODERATE (A) 10/21/2022 1653   Sepsis Labs: @LABRCNTIP (procalcitonin:4,lacticidven:4) )  No results found for this or any previous visit (from the past 240 hour(s)).   Radiological Exams on Admission: CT ABDOMEN PELVIS WO CONTRAST  Result Date: 01/25/2023 CLINICAL DATA:  Right lower quadrant pain and early satiety with nausea, vomiting and diarrhea. EXAM: CT ABDOMEN AND PELVIS WITHOUT CONTRAST TECHNIQUE: Multidetector CT imaging of the abdomen and pelvis was performed following the standard protocol without IV contrast. RADIATION DOSE REDUCTION: This exam was performed according to the departmental dose-optimization program which includes automated exposure control, adjustment of the mA and/or kV according to patient size and/or use of iterative reconstruction technique. COMPARISON:  CT without contrast 01/13/2022 FINDINGS: Lower chest: Lung bases again show chronic scarring changes without infiltrates, and chronic eventration along the anterior right diaphragm. There is mild cardiomegaly. There are calcifications in the mitral and aortic annuli and coronary arteries and heavily in the descending aorta. Hepatobiliary: Limited image detail due to respiratory motion, noncontrast technique and artifact from the patient's right arm in the field. The liver is moderately steatotic. No focal abnormality is seen without contrast, as visualized. There are tiny stones barium in posterior gallbladder without wall thickening or biliary dilatation. Pancreas: No stranding is seen alongside the pancreatic tail consistent with acute pancreatitis. Correlate with lipase clinical symptoms. There is only slight stranding of the pancreatic head. There is no appreciable mass without contrast. There is mild pancreatic atrophy. Spleen: No abnormality is seen through the breathing motion. Adrenals/Urinary Tract: Mild general cortical volume loss both  kidneys. Bilateral cysts are again noted largest on the left is 6.5 cm, largest on the right 2.8 cm. There is right extrarenal pelvis. There is a 5 mm nonobstructive caliceal stone in the mid to lower pole left kidney, 7 mm nonobstructive stone in the inferior pole on the right and 1 or 2 punctate nonobstructive caliceal stones in the right upper pole. No hydronephrosis or ureteral stone is seen on either side. The bladder wall and lumen are unremarkable. Stomach/Bowel: No dilatation or wall thickening including the appendix. There is advanced sigmoid diverticulosis without evidence of acute diverticulitis. Vascular/Lymphatic: Heavy aortoiliac calcific plaque with visceral branch vessel atherosclerosis as well particularly the proximal renal arteries, proximal SMA. No AAA. No adenopathy. Reproductive: Status post hysterectomy. No adnexal masses. There are multiple pelvic phleboliths. Other: There are small inguinal fat hernias. There are no incarcerated hernias. There is no free air, free hemorrhage or free fluid including around the pancreas. No abscess or further focal acute process. Musculoskeletal: Moderate chronic compression fracture L1 vertebral body. Chronic degenerative grade 1 anterolisthesis L3-4 and L4-5 with levoscoliosis. Osteopenia and advanced degenerative changes. Severe acquired spinal stenosis L3-4 and L4-5. IMPRESSION: 1. CT findings of acute pancreatitis. Correlate with lipase and symptoms. 2. Cholelithiasis without evidence of acute cholecystitis. 3. Moderate hepatic steatosis. 4. Nonobstructive nephrolithiasis. 5. Aortic, branch vessel and coronary artery atherosclerosis. 6. Diverticulosis without evidence of diverticulitis. 7. Osteopenia, degenerative and chronic changes. 8. Severe chronic acquired spinal stenosis L3-4 and L4-5. Aortic Atherosclerosis (ICD10-I70.0). Electronically Signed   By: Almira Bar M.D.   On: 01/25/2023 23:32   DG Ankle Complete Right  Result Date:  01/25/2023 CLINICAL DATA:  fall EXAM: RIGHT ANKLE - COMPLETE 3+ VIEW COMPARISON:  X-ray right foot 12/12/2022 FINDINGS: There is no evidence of fracture, dislocation, or joint effusion. Posterior calcaneal spur. There is no evidence of arthropathy or other focal bone abnormality. Subcutaneus soft tissue edema. Redemonstration of chronic plantar punctate densities that may represent calcifications versus retained radiopaque foreign bodies. IMPRESSION: No  acute displaced fracture or dislocation. Electronically Signed   By: Tish Frederickson M.D.   On: 01/25/2023 22:50   DG Chest Portable 1 View  Result Date: 01/25/2023 CLINICAL DATA:  CHF.  Shortness of breath EXAM: PORTABLE CHEST 1 VIEW COMPARISON:  10/27/2022 FINDINGS: Low volume chest with streaky density at the bases. Fullness of the right hilum which is chronic and vascular by recent chest CT. No effusion or pneumothorax. No pulmonary edema or air bronchogram. IMPRESSION: Mild atelectatic type opacity at the bases.  No pulmonary edema. Electronically Signed   By: Tiburcio Pea M.D.   On: 01/25/2023 21:26   CT HEAD WO CONTRAST ( )  Result Date: 01/25/2023 CLINICAL DATA:  Head trauma, minor, normal mental status (Age 37-64y); Neck trauma (Age >= 65y) EXAM: CT HEAD WITHOUT CONTRAST CT CERVICAL SPINE WITHOUT CONTRAST TECHNIQUE: Multidetector CT imaging of the head and cervical spine was performed following the standard protocol without intravenous contrast. Multiplanar CT image reconstructions of the cervical spine were also generated. RADIATION DOSE REDUCTION: This exam was performed according to the departmental dose-optimization program which includes automated exposure control, adjustment of the mA and/or kV according to patient size and/or use of iterative reconstruction technique. COMPARISON:  CT head 01/22/2022 FINDINGS: CT HEAD FINDINGS Brain: Cerebral ventricle sizes are concordant with the degree of cerebral volume loss. Patchy and confluent areas  of decreased attenuation are noted throughout the deep and periventricular white matter of the cerebral hemispheres bilaterally, compatible with chronic microvascular ischemic disease. No evidence of large-territorial acute infarction. No parenchymal hemorrhage. No mass lesion. No extra-axial collection. No mass effect or midline shift. No hydrocephalus. Basilar cisterns are patent. Vascular: No hyperdense vessel. Skull: No acute fracture or focal lesion. Sinuses/Orbits: Paranasal sinuses and mastoid air cells are clear. The orbits are unremarkable. Other: None. CT CERVICAL SPINE FINDINGS Alignment: Normal. Skull base and vertebrae: Diffusely decreased bone density. Multilevel moderate severe degenerative changes of the spine. Multilevel moderate to severe osseous neural foraminal stenosis. No severe osseous central canal stenosis. No acute fracture. No aggressive appearing focal osseous lesion or focal pathologic process. Soft tissues and spinal canal: No prevertebral fluid or swelling. No visible canal hematoma. Upper chest: Unremarkable. Other: Severe atherosclerotic plaque of the carotid arteries within the neck. Retropharyngeal. Carotid arteries. IMPRESSION: 1. No acute intracranial abnormality. 2. No acute displaced fracture or traumatic listhesis of the cervical spine. 3. Multilevel moderate severe degenerative changes of the spine. Multilevel moderate to severe osseous neural foraminal stenosis. 4. Severe atherosclerotic plaque of the carotid arteries within the neck. Electronically Signed   By: Tish Frederickson M.D.   On: 01/25/2023 19:36   CT Cervical Spine Wo Contrast  Result Date: 01/25/2023 CLINICAL DATA:  Head trauma, minor, normal mental status (Age 52-64y); Neck trauma (Age >= 65y) EXAM: CT HEAD WITHOUT CONTRAST CT CERVICAL SPINE WITHOUT CONTRAST TECHNIQUE: Multidetector CT imaging of the head and cervical spine was performed following the standard protocol without intravenous contrast.  Multiplanar CT image reconstructions of the cervical spine were also generated. RADIATION DOSE REDUCTION: This exam was performed according to the departmental dose-optimization program which includes automated exposure control, adjustment of the mA and/or kV according to patient size and/or use of iterative reconstruction technique. COMPARISON:  CT head 01/22/2022 FINDINGS: CT HEAD FINDINGS Brain: Cerebral ventricle sizes are concordant with the degree of cerebral volume loss. Patchy and confluent areas of decreased attenuation are noted throughout the deep and periventricular white matter of the cerebral hemispheres bilaterally, compatible with chronic microvascular ischemic  disease. No evidence of large-territorial acute infarction. No parenchymal hemorrhage. No mass lesion. No extra-axial collection. No mass effect or midline shift. No hydrocephalus. Basilar cisterns are patent. Vascular: No hyperdense vessel. Skull: No acute fracture or focal lesion. Sinuses/Orbits: Paranasal sinuses and mastoid air cells are clear. The orbits are unremarkable. Other: None. CT CERVICAL SPINE FINDINGS Alignment: Normal. Skull base and vertebrae: Diffusely decreased bone density. Multilevel moderate severe degenerative changes of the spine. Multilevel moderate to severe osseous neural foraminal stenosis. No severe osseous central canal stenosis. No acute fracture. No aggressive appearing focal osseous lesion or focal pathologic process. Soft tissues and spinal canal: No prevertebral fluid or swelling. No visible canal hematoma. Upper chest: Unremarkable. Other: Severe atherosclerotic plaque of the carotid arteries within the neck. Retropharyngeal. Carotid arteries. IMPRESSION: 1. No acute intracranial abnormality. 2. No acute displaced fracture or traumatic listhesis of the cervical spine. 3. Multilevel moderate severe degenerative changes of the spine. Multilevel moderate to severe osseous neural foraminal stenosis. 4. Severe  atherosclerotic plaque of the carotid arteries within the neck. Electronically Signed   By: Tish Frederickson M.D.   On: 01/25/2023 19:36    EKG: Independently reviewed. Sinus rhythm, RBBB, QTc 519.   Assessment/Plan   1. AKI superimposed on CKD IV; acidosis; uremia; hyperkalemia  - BUN is 128 and SCr 2.74 on admission (from 81 & 1.74 on 01/17/23); serum bicarb is 14 and potassium 5.8  - No hydronephrosis on CT in ED  - She was given 1 liter NS in ED  - Hold Lasix and antihypertensives, continue IVF with isotonic bicarbonate, renally-dose medications, continue cardiac monitoring, and follow serial chem panels    2. Acute pancreatitis  - CT findings concerning for acute pancreatitis  - She denies abdominal pain but is quite tender on exam  - Elevated transaminases raise concern for biliary pancreatitis  - Check lipase, RUQ [place], and triglycerides, continue IVF hydration, bowel-rest, and pain-control    3. Hx of CVA  - Hold Lipitor in light of newly elevated transaminases, continue ASA    4. Osteomyelitis of right foot  - Followed by ID and managed with minocycline which she was advised to hold for a couple days in light of diarrhea  - She is not septic on admission; hold antibiotic for now per ID recommendation    5. Elevated LFTs  - Steatosis noted on CT which was also notable for pancreatitis   - Hold Lipitor, check [place], trend LFTs    6. Hypertension  - Hold antihypertensives for now to optimize renal perfusion    7. Hypothermia  - 34.6 C rectally in ED  - Glucose is normal, she does not appear septic, and this is likely related to uremia (or possibly pancreatitis)   - Check TSH and cortisol, continue warming measures    DVT prophylaxis: SCDs  Code Status: DNR  Level of Care: Level of care: Progressive Family Communication: Daughter at bedside  Disposition Plan:  Patient is from: home  Anticipated d/c is to: TBD Anticipated d/c date is: 01/29/23  Patient currently: Pending  improved/stable renal function, tolerance of adequate oral intake  Consults called: none  Admission status: Inpatient     Briscoe Deutscher, MD [facility]  01/26/2023, 1:09 AM

## 2023-01-26 NOTE — ED Notes (Signed)
Patient is resting comfortably. 

## 2023-01-26 NOTE — Hospital Course (Addendum)
[name] is a 89 y.o. female with past medical history significant for hypertension, CAD, CVA, CKD stage IV, osteomyelitis of the right foot presented to hospital after a fall and recent loss of appetite and nausea.  Of note patient was recently started on antibiotics for right foot osteomyelitis 1 week ago and had developed diarrhea after antibiotics with some nausea and loss of appetite.  In the ED patient had low rectal temperature at.34.6 C, bradycardia but with stable blood pressure.    EKG demonstrated sinus rhythm with RBBB and prolonged QT interval.  Chemistry was notable for BUN 128, potassium 5.8, bicarbonate 14, creatinine 2.74, AST 176, and ALT 158.  CBC notable for leukocytosis of 12,900 and clumped platelets.  BNP is elevated to 259.  Blood culture was collected in the ED and patient received normal saline Lokelma and and was admitted hospital for further evaluation and treatment.  Assessment/Plan    AKI superimposed on CKD IV; acidosis; uremia; hyperkalemia  - BUN is 128 and SCr 2.74 on admission (from 81 & 1.74 on 01/17/23); CT scan without any hydronephrosis.  Received 1 L of normal saline.  Continue to hold Lasix and antihypertensives.  Creatinine today at 2.6 with slight downtrend.  Potassium at 5.2 and borderline high.  Continue intake and output charting Eliquis.  Will continue to monitor.  Continue IV fluids with sodium bicarbonate..   Acute pancreatitis  CT findings concerning for acute pancreatitis.  Tenderness on palpation but denied abdominal pain.  Had elevated LFTs..  Lipase was elevated at 361.  Right upper quadrant ultrasound with suboptimal exam.  On clears.  Hx of CVA  Continue aspirin.  Hold Lipitor   Osteomyelitis of right foot  Was on minocycline as outpatient and was on hold due to diarrhea as per ID recommendation.  Currently on IV Rocephin..    Elevated LFTs with hepatic steatosis. Continue to hold Lipitor,  trend LFTs     Essential hypertension  Patient  is on amlodipine, Lasix and metoprolol at home.  Currently on hold.    Hypothermia  - 34.6 C rectally in ED.  TSH low at 0.1.  Cortisol in process.  Latest temperature of 34.4.  Does not appear to be septic.  Could be related to pancreatitis/AKI.  Will closely monitor.

## 2023-01-26 NOTE — Progress Notes (Signed)
PROGRESS NOTE    [name]  WUJ:[id number] DOB: 07/31/1923 DOA: 01/25/2023 PCP: Irena Reichmann, DO   Brief Narrative: 89 year old female lives at home with her family with history of CAD CKD stage IV stroke recent diagnosis of right foot osteomyelitis on Minocycline admitted with nausea decreased appetite and fall at home.  Per family her Lasix dose was increased 2 weeks ago for weight gain and was being treated with minocycline for right foot osteomyelitis.  She then developed diarrhea they called the ID and was told to hold minocycline for 2 days and restart.  Family also was concerned patients abdomen was tender she was found to have elevated lipase with CT evidence of acute pancreatitis. She was found to be in AKI with a creatinine of 2.74 potassium 5.8 she is admitted for the treatment of the same.  Assessment & Plan:   Principal Problem:   Acute renal failure superimposed on stage 4 chronic kidney disease Active Problems:   History of CVA (cerebrovascular accident)   Hyperkalemia   Coronary artery disease   Hypothermia   Pyogenic inflammation of bone   Uremia   Metabolic acidosis   Elevated transaminase level   Acute pancreatitis   Prolonged QT interval   #1 AKI on CKD stage IV -secondary to decreased p.o. intake, diarrhea from antibiotics and diuretics.  Patient admitted with increased lethargy generalized weakness nausea and decreased appetite.  Her creatinine was found to be 2.74 on admission with a baseline of around 1.7.  Her BUN was 128. Continue to hold Lasix and antihypertensives Continue IV fluids carefully Avoid nephrotoxins Her creatinine not much improved today though trending down slowly. Will check bladder scan Renal ultrasound to rule out hydronephrosis Discussed with daughter-patient is DNR Family and patient does not want to undergo dialysis and she is not a candidate for dialysis. Will admit her to [facility] Follow-up labs in a.m.  #2 acute  pancreatitis with elevated lipase abdominal tenderness CT evidence of acute pancreatitis.  A limited abdominal ultrasound shows no acute findings. CT abdomen showed cholelithiasis this was not observed and ultrasound of gallbladder.  Nonobstructive nephrolithiasis moderate hepatic steatosis. Continue IV fluids Recheck lipase  #3 right foot osteomyelitis restart minocycline  #4 history of stroke on aspirin and statin Will hold statin due to transaminitis.  #5 history of hypertension-will do as needed hydralazine  #6 hypothermia-follow-up blood cultures TSH-0.137 Check T3-T4 Cortisol  #3 right foot osteomyelitis restart Minocycline   Estimated body mass index is 27.65 kg/m as calculated from the following:   Height as of this encounter: 4\' 11"  (1.499 m).   Weight as of this encounter: 62.1 kg.  DVT prophylaxis: scd Code Status: dnr Family Communication:  dw daughter Waynetta Sandy Disposition Plan:  Status is: Inpatient Remains inpatient appropriate because: aki   Consultants:  none  Procedures: none Antimicrobials:minocycline  Subjective: Resting in bed very frail in no acute distress multiple skin tears noted  Objective: Vitals:   01/26/23 0432 01/26/23 0653 01/26/23 0730 01/26/23 0840  BP:  (!) 143/69 (!) 143/66   Pulse:  87 81   Resp:  20 14   Temp: (!) 93.5 F (34.2 C)   (!) 94 F (34.4 C)  TempSrc: Rectal   Oral  SpO2:  97% 100%   Weight:      Height:        Intake/Output Summary (Last 24 hours) at 01/26/2023 0918 Last data filed at 01/26/2023 0110 Gross per 24 hour  Intake 1100 ml  Output --  Net 1100 ml   Filed Weights   01/25/23 1858  Weight: 62.1 kg    Examination:  General exam: Appears in no acute distress frail elderly female chronically ill-appearing Respiratory system: Clear to auscultation. Respiratory effort normal. Cardiovascular system: S1 & S2 heard, RRR. No JVD, murmurs, rubs, gallops or clicks. No pedal edema. Gastrointestinal system:  Abdomen is distended, soft and nontender. No organomegaly or masses felt. Normal bowel sounds heard. Central nervous system: Alert and oriented. Able to answer questions appropriately and follow commands Ext 1+ edema Skin: Multiple skin tears  psychiatry: Judgement and insight appear normal. Mood & affect appropriate.   Data Reviewed: I have personally reviewed following labs and imaging studies  CBC: Recent Labs  Lab 01/25/23 2002 01/26/23 0532  WBC 12.9* 8.7  NEUTROABS 11.7*  --   HGB 11.9* 10.8*  HCT 40.1 35.2*  MCV 90.7 86.7  PLT PLATELET CLUMPS NOTED ON SMEAR, UNABLE TO ESTIMATE 200   Basic Metabolic Panel: Recent Labs  Lab 01/25/23 2002 01/26/23 0532  NA 135 134*  K 5.8* 5.2*  CL 108 106  CO2 14* 17*  GLUCOSE 119* 108*  BUN 128* 125*  CREATININE 2.74* 2.65*  CALCIUM 8.5* 7.8*  MG  --  1.9  PHOS  --  7.6*   GFR: Estimated Creatinine Clearance: 9.5 mL/min (A) (by C-G formula based on SCr of 2.65 mg/dL (H)). Liver Function Tests: Recent Labs  Lab 01/25/23 2002 01/26/23 0532  AST 176* 151*  ALT 158* 144*  ALKPHOS 171* 157*  BILITOT 0.8 0.7  PROT 6.4* 5.5*  ALBUMIN 3.1* 2.6*   Recent Labs  Lab 01/25/23 2002  LIPASE 361*   Recent Labs  Lab 01/25/23 2318  AMMONIA 13   Coagulation Profile: Recent Labs  Lab 01/25/23 2318  INR 1.0   Cardiac Enzymes: No results for input(s): "CKTOTAL", "CKMB", "CKMBINDEX", "TROPONINI" in the last 168 hours. BNP (last 3 results) No results for input(s): "PROBNP" in the last 8760 hours. HbA1C: No results for input(s): "HGBA1C" in the last 72 hours. CBG: No results for input(s): "GLUCAP" in the last 168 hours. Lipid Profile: Recent Labs    01/26/23 0532  TRIG 41   Thyroid Function Tests: Recent Labs    01/26/23 0532  TSH 0.137*   Anemia Panel: No results for input(s): "VITAMINB12", "FOLATE", "FERRITIN", "TIBC", "IRON", "RETICCTPCT" in the last 72 hours. Sepsis Labs: Recent Labs  Lab 01/25/23 2318   LATICACIDVEN 1.2    No results found for this or any previous visit (from the past 240 hour(s)).       Radiology Studies: US Abdomen Limited RUQ (LIVER/GB)  Result Date: 01/26/2023 CLINICAL DATA:  Acute pancreatitis, elevated LFTs EXAM: ULTRASOUND ABDOMEN LIMITED RIGHT UPPER QUADRANT COMPARISON:  CT abdomen and pelvis 01/25/2019 FINDINGS: Compromised study due to patient condition, limited mobility, and inability to hold breath. Gallbladder: No gallbladder wall thickening or pericholecystic fluid. The cholelithiasis seen on CT was not well visualized sonographically. Common bile duct: Diameter: 3 mm.  No intrahepatic biliary dilation. Liver: No focal lesion identified. Increased parenchymal echogenicity. Portal vein is patent on color Doppler imaging with normal direction of blood flow towards the liver. Other: None. IMPRESSION: Suboptimal exam.  No acute abnormality. Electronically Signed   By: Minerva Fester M.D.   On: 01/26/2023 01:59   CT ABDOMEN PELVIS WO CONTRAST  Result Date: 01/25/2023 CLINICAL DATA:  Right lower quadrant pain and early satiety with nausea, vomiting and diarrhea. EXAM: CT ABDOMEN AND PELVIS WITHOUT CONTRAST TECHNIQUE:  Multidetector CT imaging of the abdomen and pelvis was performed following the standard protocol without IV contrast. RADIATION DOSE REDUCTION: This exam was performed according to the departmental dose-optimization program which includes automated exposure control, adjustment of the mA and/or kV according to patient size and/or use of iterative reconstruction technique. COMPARISON:  CT without contrast 01/13/2022 FINDINGS: Lower chest: Lung bases again show chronic scarring changes without infiltrates, and chronic eventration along the anterior right diaphragm. There is mild cardiomegaly. There are calcifications in the mitral and aortic annuli and coronary arteries and heavily in the descending aorta. Hepatobiliary: Limited image detail due to respiratory  motion, noncontrast technique and artifact from the patient's right arm in the field. The liver is moderately steatotic. No focal abnormality is seen without contrast, as visualized. There are tiny stones barium in posterior gallbladder without wall thickening or biliary dilatation. Pancreas: No stranding is seen alongside the pancreatic tail consistent with acute pancreatitis. Correlate with lipase clinical symptoms. There is only slight stranding of the pancreatic head. There is no appreciable mass without contrast. There is mild pancreatic atrophy. Spleen: No abnormality is seen through the breathing motion. Adrenals/Urinary Tract: Mild general cortical volume loss both kidneys. Bilateral cysts are again noted largest on the left is 6.5 cm, largest on the right 2.8 cm. There is right extrarenal pelvis. There is a 5 mm nonobstructive caliceal stone in the mid to lower pole left kidney, 7 mm nonobstructive stone in the inferior pole on the right and 1 or 2 punctate nonobstructive caliceal stones in the right upper pole. No hydronephrosis or ureteral stone is seen on either side. The bladder wall and lumen are unremarkable. Stomach/Bowel: No dilatation or wall thickening including the appendix. There is advanced sigmoid diverticulosis without evidence of acute diverticulitis. Vascular/Lymphatic: Heavy aortoiliac calcific plaque with visceral branch vessel atherosclerosis as well particularly the proximal renal arteries, proximal SMA. No AAA. No adenopathy. Reproductive: Status post hysterectomy. No adnexal masses. There are multiple pelvic phleboliths. Other: There are small inguinal fat hernias. There are no incarcerated hernias. There is no free air, free hemorrhage or free fluid including around the pancreas. No abscess or further focal acute process. Musculoskeletal: Moderate chronic compression fracture L1 vertebral body. Chronic degenerative grade 1 anterolisthesis L3-4 and L4-5 with levoscoliosis. Osteopenia  and advanced degenerative changes. Severe acquired spinal stenosis L3-4 and L4-5. IMPRESSION: 1. CT findings of acute pancreatitis. Correlate with lipase and symptoms. 2. Cholelithiasis without evidence of acute cholecystitis. 3. Moderate hepatic steatosis. 4. Nonobstructive nephrolithiasis. 5. Aortic, branch vessel and coronary artery atherosclerosis. 6. Diverticulosis without evidence of diverticulitis. 7. Osteopenia, degenerative and chronic changes. 8. Severe chronic acquired spinal stenosis L3-4 and L4-5. Aortic Atherosclerosis (ICD10-I70.0). Electronically Signed   By: Almira Bar M.D.   On: 01/25/2023 23:32   DG Ankle Complete Right  Result Date: 01/25/2023 CLINICAL DATA:  fall EXAM: RIGHT ANKLE - COMPLETE 3+ VIEW COMPARISON:  X-ray right foot 12/12/2022 FINDINGS: There is no evidence of fracture, dislocation, or joint effusion. Posterior calcaneal spur. There is no evidence of arthropathy or other focal bone abnormality. Subcutaneus soft tissue edema. Redemonstration of chronic plantar punctate densities that may represent calcifications versus retained radiopaque foreign bodies. IMPRESSION: No acute displaced fracture or dislocation. Electronically Signed   By: Tish Frederickson M.D.   On: 01/25/2023 22:50   DG Chest Portable 1 View  Result Date: 01/25/2023 CLINICAL DATA:  CHF.  Shortness of breath EXAM: PORTABLE CHEST 1 VIEW COMPARISON:  10/27/2022 FINDINGS: Low volume chest with streaky  density at the bases. Fullness of the right hilum which is chronic and vascular by recent chest CT. No effusion or pneumothorax. No pulmonary edema or air bronchogram. IMPRESSION: Mild atelectatic type opacity at the bases.  No pulmonary edema. Electronically Signed   By: Tiburcio Pea M.D.   On: 01/25/2023 21:26   CT HEAD WO CONTRAST ( )  Result Date: 01/25/2023 CLINICAL DATA:  Head trauma, minor, normal mental status (Age 67-64y); Neck trauma (Age >= 65y) EXAM: CT HEAD WITHOUT CONTRAST CT CERVICAL SPINE  WITHOUT CONTRAST TECHNIQUE: Multidetector CT imaging of the head and cervical spine was performed following the standard protocol without intravenous contrast. Multiplanar CT image reconstructions of the cervical spine were also generated. RADIATION DOSE REDUCTION: This exam was performed according to the departmental dose-optimization program which includes automated exposure control, adjustment of the mA and/or kV according to patient size and/or use of iterative reconstruction technique. COMPARISON:  CT head 01/22/2022 FINDINGS: CT HEAD FINDINGS Brain: Cerebral ventricle sizes are concordant with the degree of cerebral volume loss. Patchy and confluent areas of decreased attenuation are noted throughout the deep and periventricular white matter of the cerebral hemispheres bilaterally, compatible with chronic microvascular ischemic disease. No evidence of large-territorial acute infarction. No parenchymal hemorrhage. No mass lesion. No extra-axial collection. No mass effect or midline shift. No hydrocephalus. Basilar cisterns are patent. Vascular: No hyperdense vessel. Skull: No acute fracture or focal lesion. Sinuses/Orbits: Paranasal sinuses and mastoid air cells are clear. The orbits are unremarkable. Other: None. CT CERVICAL SPINE FINDINGS Alignment: Normal. Skull base and vertebrae: Diffusely decreased bone density. Multilevel moderate severe degenerative changes of the spine. Multilevel moderate to severe osseous neural foraminal stenosis. No severe osseous central canal stenosis. No acute fracture. No aggressive appearing focal osseous lesion or focal pathologic process. Soft tissues and spinal canal: No prevertebral fluid or swelling. No visible canal hematoma. Upper chest: Unremarkable. Other: Severe atherosclerotic plaque of the carotid arteries within the neck. Retropharyngeal. Carotid arteries. IMPRESSION: 1. No acute intracranial abnormality. 2. No acute displaced fracture or traumatic listhesis of  the cervical spine. 3. Multilevel moderate severe degenerative changes of the spine. Multilevel moderate to severe osseous neural foraminal stenosis. 4. Severe atherosclerotic plaque of the carotid arteries within the neck. Electronically Signed   By: Tish Frederickson M.D.   On: 01/25/2023 19:36   CT Cervical Spine Wo Contrast  Result Date: 01/25/2023 CLINICAL DATA:  Head trauma, minor, normal mental status (Age 75-64y); Neck trauma (Age >= 65y) EXAM: CT HEAD WITHOUT CONTRAST CT CERVICAL SPINE WITHOUT CONTRAST TECHNIQUE: Multidetector CT imaging of the head and cervical spine was performed following the standard protocol without intravenous contrast. Multiplanar CT image reconstructions of the cervical spine were also generated. RADIATION DOSE REDUCTION: This exam was performed according to the departmental dose-optimization program which includes automated exposure control, adjustment of the mA and/or kV according to patient size and/or use of iterative reconstruction technique. COMPARISON:  CT head 01/22/2022 FINDINGS: CT HEAD FINDINGS Brain: Cerebral ventricle sizes are concordant with the degree of cerebral volume loss. Patchy and confluent areas of decreased attenuation are noted throughout the deep and periventricular white matter of the cerebral hemispheres bilaterally, compatible with chronic microvascular ischemic disease. No evidence of large-territorial acute infarction. No parenchymal hemorrhage. No mass lesion. No extra-axial collection. No mass effect or midline shift. No hydrocephalus. Basilar cisterns are patent. Vascular: No hyperdense vessel. Skull: No acute fracture or focal lesion. Sinuses/Orbits: Paranasal sinuses and mastoid air cells are clear. The orbits are unremarkable.  Other: None. CT CERVICAL SPINE FINDINGS Alignment: Normal. Skull base and vertebrae: Diffusely decreased bone density. Multilevel moderate severe degenerative changes of the spine. Multilevel moderate to severe osseous  neural foraminal stenosis. No severe osseous central canal stenosis. No acute fracture. No aggressive appearing focal osseous lesion or focal pathologic process. Soft tissues and spinal canal: No prevertebral fluid or swelling. No visible canal hematoma. Upper chest: Unremarkable. Other: Severe atherosclerotic plaque of the carotid arteries within the neck. Retropharyngeal. Carotid arteries. IMPRESSION: 1. No acute intracranial abnormality. 2. No acute displaced fracture or traumatic listhesis of the cervical spine. 3. Multilevel moderate severe degenerative changes of the spine. Multilevel moderate to severe osseous neural foraminal stenosis. 4. Severe atherosclerotic plaque of the carotid arteries within the neck. Electronically Signed   By: Tish Frederickson M.D.   On: 01/25/2023 19:36        Scheduled Meds:  aspirin  81 mg Oral Daily   sodium chloride flush  3 mL Intravenous Q12H   Continuous Infusions:  sodium bicarbonate 150 mEq in sterile water 1,150 mL infusion 125 mL/hr at 01/26/23 0115     LOS: 0 days   39 min Time spent:   Alwyn Ren, MD 01/26/2023, 9:18 AM

## 2023-01-26 NOTE — ED Notes (Signed)
Bair hugger removed at this time 

## 2023-01-26 NOTE — Progress Notes (Signed)
[name] ([id number]) [id number] Page 1 of 13 Visit Report for 12/21/2022 Allergy List Details Patient Name: Date of Service: [facility] [place], [place] 12/21/2022 1:15 PM Medical Record Number: [id number] Patient Account Number: [id number] Date of Birth/Sex: Treating RN: 06-28-1923 (89 y.o. [name] Primary Care [name]: [name] Other Clinician: Referring [name]: Treating [name]/Extender: [name] in Treatment: 0 Allergies Active Allergies meloxicam Reaction: hives nitrofurantoin guaifenesin Reaction: rash nitrofuran derivative Reaction: rash Allergy Notes Electronic Signature(s) Signed: 12/21/2022 5:26:11 PM By: Shawn Stall RN, BSN Entered By: Shawn Stall on 12/20/2022 12:46:20 -------------------------------------------------------------------------------- Arrival Information Details Patient Name: Date of Service: [facility] [name], Paula [name] 12/21/2022 1:15 PM Medical Record Number: [id number] Patient Account Number: [id number] Date of Birth/Sex: Treating RN: Feb 09, 1923 (89 y.o. [name] Primary Care [name]: [name] Other Clinician: Referring [name]: Treating [name]/Extender: [name] in Treatment: 0 Visit Information Patient Arrived: Wheel Chair Arrival Time: 13:33 Accompanied By: grandaughter Transfer Assistance: Manual Patient Identification Verified: Yes Secondary Verification Process Completed: Yes Patient Requires Transmission-Based Precautions: No Patient Has Alerts: Yes Patient Alerts: [name] and L ABI N/[name] Electronic Signature(s) Signed: 12/21/2022 5:03:32 PM By: Zenaida Deed RN, BSN Entered By: Zenaida Deed on 12/21/2022 14:24:45 -------------------------------------------------------------------------------- Clinic Level of Care Assessment Details Patient Name: Date of Service: [facility] [place], [name] [name] 12/21/2022 1:15 PM Medical Record Number:  [id number] Patient Account Number: [id number] Date of Birth/Sex: Treating RN: 01/23/1923 (89 y.o. [name] Primary Care Mickelle Goupil: [name] Other Clinician: Referring Kortlyn Koltz: Treating [name]/Extender: [name] in Treatment: 0 [name], [name] ([id number]) [id number] Page 2 of 13 Clinic Level of Care Assessment Items TOOL 1 Quantity Score  - 0 Use when EandM and Procedure is performed on INITIAL visit ASSESSMENTS - Nursing Assessment / Reassessment X- 1 20 General Physical Exam (combine w/ comprehensive assessment (listed just below) when performed on new pt. evals) X- 1 25 Comprehensive Assessment (HX, ROS, Risk Assessments, Wounds Hx, etc.) ASSESSMENTS - Wound and Skin Assessment / Reassessment  - 0 Dermatologic / Skin Assessment (not related to wound area) ASSESSMENTS - Ostomy and/or Continence Assessment and Care  - 0 Incontinence Assessment and Management  - 0 Ostomy Care Assessment and Management (repouching, etc.) PROCESS - Coordination of Care X - Simple Patient / Family Education for ongoing care 1 15  - 0 Complex (extensive) Patient / Family Education for ongoing care X- 1 10 Staff obtains Chiropractor, Records, T Results / Process Orders est X- 1 10 Staff telephones HHA, Nursing Homes / Clarify orders / etc  - 0 Routine Transfer to another Facility (non-emergent condition)  - 0 Routine Hospital Admission (non-emergent condition) X- 1 15 New Admissions / Manufacturing engineer / Ordering NPWT Apligraf, etc. ,  - 0 Emergency Hospital Admission (emergent condition) PROCESS - Special Needs  - 0 Pediatric / Minor Patient Management  - 0 Isolation Patient Management  - 0 Hearing / Language / Visual special needs  - 0 Assessment of Community assistance (transportation, D/[name] planning, etc.)  - 0 Additional assistance / Altered mentation  - 0 Support Surface(s)  Assessment (bed, cushion, seat, etc.) INTERVENTIONS - Miscellaneous  - 0 External ear exam  - 0 Patient Transfer (multiple staff / Nurse, adult / Similar devices)  - 0 Simple Staple / Suture removal (25 or less)  - 0 Complex Staple / Suture removal (26 or more)  - 0 Hypo/Hyperglycemic Management (do not check if billed separately) X- 1  15 Ankle / Brachial Index (ABI) - do not check if billed separately Has the patient been seen at the hospital within the last three years: Yes Total Score: 110 Level Of Care: New/Established - Level 3 Electronic Signature(s) Signed: 12/21/2022 5:03:32 PM By: Zenaida Deed RN, BSN Entered By: Zenaida Deed on 12/21/2022 16:13:54 -------------------------------------------------------------------------------- Encounter Discharge Information Details Patient Name: Date of Service: [facility] [place], Paula [name] 12/21/2022 1:15 PM Medical Record Number: [id number] Patient Account Number: [id number] Date of Birth/Sex: Treating RN: [birth year] (89 y.o. [name] Primary Care [name]: [name] Other Clinician: Referring [name]: Treating [name]/Extender: [name] in Treatment: 0 [name] ([id number]) [id number] Page 3 of 13 Encounter Discharge Information Items Post Procedure Vitals Discharge Condition: Stable Temperature (F): 97.5 Ambulatory Status: Wheelchair Pulse (bpm): 80 Discharge Destination: Home Respiratory Rate (breaths/min): 18 Transportation: Private Auto Blood Pressure (mmHg): 139/70 Accompanied By: grand daughter Schedule Follow-up Appointment: Yes Clinical Summary of Care: Patient Declined Electronic Signature(s) Signed: 01/26/2023 1:55:51 PM By: Brenton Grills Entered By: Brenton Grills on 12/21/2022 16:06:38 -------------------------------------------------------------------------------- Lower Extremity Assessment Details Patient Name: Date of Service: [facility] [place], [name] [name] 12/21/2022 1:15 PM Medical Record Number: [id number] Patient Account Number: [id number] Date of Birth/Sex: Treating RN: Dec 17, 1922 (89 y.o. [name] Primary Care Caili Escalera: [name] Other Clinician: Referring Ariam Mol: Treating Lyndi Holbein/Extender: [name] in Treatment: 0 Edema Assessment Assessed: [Left: No] [Right: No] Edema: [Left: Yes] [Right: Yes] Calf Left: Right: Point of Measurement: From Medial Instep 24 cm 25.3 cm Ankle Left: Right: Point of Measurement: From Medial Instep 20.5 cm 21.3 cm Vascular Assessment Pulses: Dorsalis Pedis Palpable: [Left:No] [Right:Yes] Popliteal Doppler Audible: [Left:Yes] [Right:Yes] Notes Rt and Lt DP and PT doppler - non compressible. Electronic Signature(s) Signed: 12/21/2022 5:03:32 PM By: Zenaida Deed RN, BSN Entered By: Zenaida Deed on 12/21/2022 14:05:31 -------------------------------------------------------------------------------- Multi Wound Chart Details Patient Name: Date of Service: [facility] [place], Paula [name] 12/21/2022 1:15 PM Medical Record Number: [id number] Patient Account Number: [id number] Date of Birth/Sex: Treating RN: [birth year] (89 y.o. F) Primary Care Ailie Gage: [name] Other Clinician: Referring Damiano Stamper: Treating Kellie Murrill/Extender: [name] in Treatment: 0 Vital Signs Height(in): 60 Pulse(bpm): 80 Weight(lbs): 130 Blood Pressure(mmHg): 139/70 Body Mass Index(BMI): 25.4 Temperature(F): 97.5 Respiratory Rate(breaths/min): [address], Mahima [name] ([id number]) [phone].pdf Page 4 of 13 [1:Photos:] Right, Medial Metatarsal head first Right Calcaneus Left, Posterior Lower Leg Wound Location: Gradually Appeared Pressure Injury Pressure Injury Wounding Event: Pressure Ulcer Pressure Ulcer Venous Leg Ulcer Primary Etiology: Cataracts, Glaucoma, Anemia, Cataracts, Glaucoma, Anemia, Cataracts, Glaucoma,  Anemia, Comorbid History: Congestive Heart Failure, Coronary Congestive Heart Failure, Coronary Congestive Heart Failure, Coronary Artery Disease, Hypertension, Gout, Artery Disease, Hypertension, Gout,Artery Disease, Hypertension, Gout, Osteoarthritis, Confinement Anxiety Osteoarthritis, Confinement AnxietyOsteoarthritis, Confinement Anxiety 12/07/2022 10/24/2022 12/21/2022 Date Acquired: 0 0 0 [name] of Treatment: Open Open Open Wound Status: No No No Wound Recurrence: 1x1.7x0.3 0.4x0.7x0.1 1.2x2.3x0.1 Measurements L x W x D (cm) 1.335 0.22 2.168 A (cm) : rea 0.401 0.022 0.217 Volume (cm) : Category/Stage IV Category/Stage III Full Thickness Without Exposed Classification: Support Structures Small Medium Medium Exudate A mount: Serosanguineous Serous Serosanguineous Exudate Type: red, brown amber red, brown Exudate Color: Distinct, outline attached Distinct, outline attached Flat and Intact Wound Margin: None Present (0%) None Present (0%) Medium (34-66%) Granulation A mount: N/A N/A Red Granulation Quality: Medium (34-66%) Large (67-100%) Medium (34-66%) Necrotic A mount: Fat Layer (Subcutaneous Tissue): Yes Fat Layer (Subcutaneous Tissue): Yes Fat  Layer (Subcutaneous Tissue): Yes Exposed Structures: Bone: Yes Fascia: No Fascia: No Tendon: No Tendon: No Muscle: No Muscle: No Joint: No Joint: No Bone: No Bone: No Medium (34-66%) Small (1-33%) Small (1-33%) Epithelialization: Debridement - Excisional N/A N/A Debridement: Pre-procedure Verification/Time Out 14:50 N/A N/A Taken: Lidocaine 4% Topical Solution N/A N/A Pain Control: Bone N/A N/A Tissue Debrided: Skin/Subcutaneous N/A N/A Level: Tissue/Muscle/Bone 1.7 N/A N/A Debridement A (sq cm): rea Nippers N/A N/A Instrument: Minimum N/A N/A Bleeding: Pressure N/A N/A Hemostasis Achieved: 0 N/A N/A Procedural Pain: 0 N/A N/A Post Procedural Pain: Debridement Treatment Response: Procedure was  tolerated well N/A N/A Post Debridement Measurements L x 1x1.7x0.3 N/A N/A W x D (cm) 0.401 N/A N/A Post Debridement Volume: (cm) Category/Stage IV N/A N/A Post Debridement Stage: Callus: Yes No Abnormalities Noted No Abnormalities Noted Periwound Skin Texture: Maceration: No Dry/Scaly: Yes No Abnormalities Noted Periwound Skin Moisture: Dry/Scaly: No Maceration: No Hemosiderin Staining: Yes No Abnormalities Noted Erythema: Yes Periwound Skin Color: Cool/Cold No Abnormality No Abnormality Temperature: Yes Yes Yes Tenderness on Palpation: Debridement N/A N/A Procedures Performed: Wound Number: 4 N/A N/A Photos: N/A N/A Left Calcaneus N/A N/A Wound Location: Pressure Injury N/A N/A Wounding Event: Pressure Ulcer N/A N/A Primary Etiology: Cataracts, Glaucoma, Anemia, N/A N/A Comorbid History: Congestive Heart Failure, Coronary [name], [name] ([id number]) [phone].pdf Page 5 of 13 Artery Disease, Hypertension, Gout, Osteoarthritis, Confinement Anxiety 10/24/2022 N/A N/A Date A cquired: 0 N/A N/A [name] of Treatment: Open N/A N/A Wound Status: No N/A N/A Wound Recurrence: 0.4x0.5x0.1 N/A N/A Measurements L x W x D (cm) 0.157 N/A N/A A (cm) : rea 0.016 N/A N/A Volume (cm) : Category/Stage III N/A N/A Classification: Medium N/A N/A Exudate A mount: Serosanguineous N/A N/A Exudate Type: red, brown N/A N/A Exudate Color: Distinct, outline attached N/A N/A Wound Margin: Large (67-100%) N/A N/A Granulation A mount: Pink, Pale N/A N/A Granulation Quality: Small (1-33%) N/A N/A Necrotic A mount: Fat Layer (Subcutaneous Tissue): Yes N/A N/A Exposed Structures: Fascia: No Tendon: No Muscle: No Joint: No Bone: No Small (1-33%) N/A N/A Epithelialization: N/A N/A N/A Debridement: N/A N/A N/A Pain Control: N/A N/A N/A Tissue Debrided: N/A N/A N/A Level: N/A N/A N/A Debridement A (sq cm): rea N/A N/A N/A Instrument: N/A  N/A N/A Bleeding: N/A N/A N/A Hemostasis A chieved: N/A N/A N/A Procedural Pain: N/A N/A N/A Post Procedural Pain: Debridement Treatment Response: N/A N/A N/A Post Debridement Measurements L x N/A N/A N/A W x D (cm) N/A N/A N/A Post Debridement Volume: (cm) N/A N/A N/A Post Debridement Stage: Callus: Yes N/A N/A Periwound Skin Texture: Dry/Scaly: Yes N/A N/A Periwound Skin Moisture: No Abnormalities Noted N/A N/A Periwound Skin Color: No Abnormality N/A N/A Temperature: Yes N/A N/A Tenderness on Palpation: N/A N/A N/A Procedures Performed: Treatment Notes Wound #1 (Metatarsal head first) Wound Laterality: Right, Medial Cleanser Peri-Wound Care Topical Primary Dressing Hydrofera Blue Ready Transfer Foam, 2.5x2.5 (in/in) Discharge Instruction: Apply directly to wound bed as directed Secondary Dressing Woven Gauze Sponge, Non-Sterile 4x4 in Discharge Instruction: Apply over primary dressing as directed. Secured With [employer], 4.5x3.1 (in/yd) Discharge Instruction: Secure with Kerlix as directed. Compression Wrap tubigrip Discharge Instruction: apply in morning and may remove at night Compression Stockings Add-Ons Wound #2 (Calcaneus) Wound Laterality: Right Cleanser Peri-Wound Care [name], [name] ([id number]) [id number] Page 6 of 13 Topical Primary Dressing Hydrofera Blue Ready Transfer Foam, 2.5x2.5 (in/in) Discharge Instruction: Apply directly to wound bed as directed Secondary Dressing Zetuvit Plus Silicone  Border Dressing 4x4 (in/in) Discharge Instruction: Apply silicone border over primary dressing as directed. Secured With Compression Wrap Compression Stockings Add-Ons Wound #3 (Lower Leg) Wound Laterality: Left, Posterior Cleanser Peri-Wound Care Topical Primary Dressing Hydrofera Blue Ready Transfer Foam, 2.5x2.5 (in/in) Discharge Instruction: Apply directly to wound bed as directed Secondary  Dressing Zetuvit Plus Silicone Border Dressing 4x4 (in/in) Discharge Instruction: Apply silicone border over primary dressing as directed. tubigrip or stocking Secured With Compression Wrap Compression Stockings Add-Ons Wound #4 (Calcaneus) Wound Laterality: Left Cleanser Peri-Wound Care Topical Primary Dressing Hydrofera Blue Ready Transfer Foam, 2.5x2.5 (in/in) Discharge Instruction: Apply directly to wound bed as directed Secondary Dressing Zetuvit Plus Silicone Border Dressing 4x4 (in/in) Discharge Instruction: Apply silicone border over primary dressing as directed. Secured With Compression Wrap Compression Stockings Add-Ons Electronic Signature(s) Signed: 12/22/2022 3:34:03 PM By: Geralyn Corwin DO Entered By: Geralyn Corwin on 12/21/2022 16:59:25 -------------------------------------------------------------------------------- Multi-Disciplinary Care Plan Details Patient Name: Date of Service: [facility] [place], Paula [name] 12/21/2022 1:15 PM Medical Record Number: [id number] Patient Account Number: [id number] QUINNETTA, ROEPKE ([id number]) [phone].pdf Page 7 of 13 Date of Birth/Sex: Treating RN: August 16, 1923 (89 y.o. [name] Primary Care Shenise Wolgamott: Other Clinician: Irena [name] Referring Valori Hollenkamp: Treating Reneka Nebergall/Extender: [name] in Treatment: 0 Multidisciplinary Care Plan reviewed with physician Active Inactive Abuse / Safety / Falls / Self Care Management Nursing Diagnoses: History of Falls Goals: Patient/caregiver will verbalize/demonstrate measures taken to prevent injury and/or falls Date Initiated: 12/21/2022 Target Resolution Date: 01/18/2023 Goal Status: Active Interventions: Assess fall risk on admission and as needed Assess impairment of mobility on admission and as needed per policy Assess personal safety and home safety (as indicated) on admission and as needed Notes: Pressure Nursing  Diagnoses: Knowledge deficit related to causes and risk factors for pressure ulcer development Knowledge deficit related to management of pressures ulcers Potential for impaired tissue integrity related to pressure, friction, moisture, and shear Goals: Patient/caregiver will verbalize understanding of pressure ulcer management Date Initiated: 12/21/2022 Target Resolution Date: 01/18/2023 Goal Status: Active Interventions: Assess: immobility, friction, shearing, incontinence upon admission and as needed Assess offloading mechanisms upon admission and as needed Assess potential for pressure ulcer upon admission and as needed Provide education on pressure ulcers Notes: Wound/Skin Impairment Nursing Diagnoses: Impaired tissue integrity Knowledge deficit related to ulceration/compromised skin integrity Goals: Patient/caregiver will verbalize understanding of skin care regimen Date Initiated: 12/21/2022 Target Resolution Date: 01/18/2023 Goal Status: Active Ulcer/skin breakdown will have a volume reduction of 30% by week 4 Date Initiated: 12/21/2022 Target Resolution Date: 01/18/2023 Goal Status: Active Interventions: Assess patient/caregiver ability to obtain necessary supplies Assess patient/caregiver ability to perform ulcer/skin care regimen upon admission and as needed Assess ulceration(s) every visit Provide education on ulcer and skin care Treatment Activities: Skin care regimen initiated : 12/21/2022 Topical wound management initiated : 12/21/2022 Notes: Electronic Signature(s) Signed: 12/21/2022 5:03:32 PM By: Zenaida Deed RN, BSN [place], Pincus Sanes ([id number]) [phone].pdf Page 8 of 13 Entered By: Zenaida Deed on 12/21/2022 14:35:16 -------------------------------------------------------------------------------- Pain Assessment Details Patient Name: Date of Service: [facility] [place], [place] 12/21/2022 1:15 PM Medical Record Number: [id number] Patient  Account Number: [id number] Date of Birth/Sex: Treating RN: 10/01/1923 (89 y.o. [name] Primary Care Shatoria Stooksbury: [name] Other Clinician: Referring Trinity Hyland: Treating Maeley Matton/Extender: [name] in Treatment: 0 Active Problems Location of Pain Severity and Description of Pain Patient Has Paino Yes Site Locations Pain Location: Pain in Ulcers With Dressing Change: Yes Duration of the Pain.  Constant / Intermittento Constant Rate the pain. Current Pain Level: 6 Worst Pain Level: 8 Least Pain Level: 3 Character of Pain Describe the Pain: Aching Pain Management and Medication Current Pain Management: Medication: Yes Is the Current Pain Management Adequate: Adequate How does your wound impact your activities of daily livingo Sleep: No Bathing: No Appetite: No Relationship With Others: No Bladder Continence: No Emotions: No Bowel Continence: No Hobbies: No Toileting: No Dressing: No Electronic Signature(s) Signed: 12/21/2022 5:03:32 PM By: Zenaida Deed RN, BSN Entered By: Zenaida Deed on 12/21/2022 14:23:46 -------------------------------------------------------------------------------- Patient/Caregiver Education Details Patient Name: Date of Service: [name], [place] 3/13/2024andnbsp1:15 PM Medical Record Number: [id number] Patient Account Number: [id number] Date of Birth/Gender: Treating RN: 12-Feb-1923 (89 y.o. [name] Primary Care Physician: [name] Other Clinician: Referring Physician: Treating Physician/Extender: [name] in Treatment: 0 Education Assessment Education Provided To: Patient HAJA, CREGO ([id number]) [id number] Page 9 of 13 Education Topics Provided Pressure: Handouts: Pressure Injury: Prevention and Offloading Methods: Explain/Verbal, Printed Responses: Reinforcements needed, State content correctly Venous: Methods:  Explain/Verbal Responses: Reinforcements needed, State content correctly [facility] Packet: o Handouts: [facility] o Methods: Explain/Verbal Responses: Reinforcements needed, State content correctly Wound/Skin Impairment: Handouts: Caring for Your Ulcer Methods: Explain/Verbal, Printed Responses: Reinforcements needed, State content correctly Electronic Signature(s) Signed: 12/21/2022 5:03:32 PM By: Zenaida Deed RN, BSN Entered By: Zenaida Deed on 12/21/2022 14:36:55 -------------------------------------------------------------------------------- Wound Assessment Details Patient Name: Date of Service: [facility] [place], [name] [name] 12/21/2022 1:15 PM Medical Record Number: [id number] Patient Account Number: [id number] Date of Birth/Sex: Treating RN: Jun 02, 1923 (89 y.o. [name] Primary Care Bryam Taborda: [name] Other Clinician: Referring Meiko Ives: Treating Heran Campau/Extender: [name] in Treatment: 0 Wound Status Wound Number: 1 Primary Pressure Ulcer Etiology: Wound Location: Right, Medial Metatarsal head first Wound Open Wounding Event: Gradually Appeared Status: Date Acquired: 12/07/2022 Comorbid Cataracts, Glaucoma, Anemia, Congestive Heart Failure, Coronary [name] Of Treatment: 0 History: Artery Disease, Hypertension, Gout, Osteoarthritis, Confinement Clustered Wound: No Anxiety Photos Wound Measurements Length: (cm) 1 Width: (cm) 1.7 Depth: (cm) 0.3 Area: (cm) 1.335 Volume: (cm) 0.401 % Reduction in Area: % Reduction in Volume: Epithelialization: Medium (34-66%) Tunneling: No Undermining: No Wound Description Classification: Category/Stage IV Wound Margin: Distinct, outline attached Exudate Amount: Small Exudate Type: Serosanguineous Exudate Color: red, brown [name], [name] ([id number]) Foul Odor After Cleansing: No Slough/Fibrino  Yes [phone].pdf Page 10 of 13 Wound Bed Granulation Amount: None Present (0%) Exposed Structure Necrotic Amount: Medium (34-66%) Fat Layer (Subcutaneous Tissue) Exposed: Yes Necrotic Quality: Adherent Slough Bone Exposed: Yes Periwound Skin Texture Texture Color No Abnormalities Noted: No No Abnormalities Noted: No Callus: Yes Hemosiderin Staining: Yes Moisture Temperature / Pain No Abnormalities Noted: No Temperature: Cool/Cold Dry / Scaly: No Tenderness on Palpation: Yes Maceration: No Electronic Signature(s) Signed: 12/21/2022 5:03:32 PM By: Zenaida Deed RN, BSN Entered By: Zenaida Deed on 12/21/2022 14:17:46 -------------------------------------------------------------------------------- Wound Assessment Details Patient Name: Date of Service: [facility] [place], Paula [name] 12/21/2022 1:15 PM Medical Record Number: [id number] Patient Account Number: [id number] Date of Birth/Sex: Treating RN: [birth year] (89 y.o. [name] Primary Care Sharol Croghan: [name] Other Clinician: Referring Alexzander Dolinger: Treating Ayad Nieman/Extender: [name] in Treatment: 0 Wound Status Wound Number: 2 Primary Pressure Ulcer Etiology: Wound Location: Right Calcaneus Wound Open Wounding Event: Pressure Injury Status: Date Acquired: 10/24/2022 Comorbid Cataracts, Glaucoma, Anemia, Congestive Heart Failure, Coronary [name] Of Treatment: 0  History: Artery Disease, Hypertension, Gout, Osteoarthritis, Confinement Clustered Wound: No Anxiety Photos Wound Measurements Length: (cm) 0.4 Width: (cm) 0.7 Depth: (cm) 0.1 Area: (cm) 0.22 Volume: (cm) 0.022 % Reduction in Area: % Reduction in Volume: Epithelialization: Small (1-33%) Tunneling: No Undermining: No Wound Description Classification: Category/Stage III Wound Margin: Distinct, outline attached Exudate Amount: Medium Exudate Type: Serous Exudate Color: amber Foul Odor After  Cleansing: No Slough/Fibrino Yes Wound Bed Granulation Amount: None Present (0%) Exposed Structure Necrotic Amount: Large (67-100%) Fascia Exposed: No Necrotic Quality: Adherent Slough Fat Layer (Subcutaneous Tissue) Exposed: Yes Tendon Exposed: No [name], [name] ([id number]) [id number] Page 11 of 13 Muscle Exposed: No Joint Exposed: No Bone Exposed: No Periwound Skin Texture Texture Color No Abnormalities Noted: Yes No Abnormalities Noted: Yes Moisture Temperature / Pain No Abnormalities Noted: No Temperature: No Abnormality Dry / Scaly: Yes Tenderness on Palpation: Yes Maceration: No Electronic Signature(s) Signed: 12/21/2022 5:03:32 PM By: Zenaida Deed RN, BSN Entered By: Zenaida Deed on 12/21/2022 14:22:05 -------------------------------------------------------------------------------- Wound Assessment Details Patient Name: Date of Service: [facility] [place], Paula [name] 12/21/2022 1:15 PM Medical Record Number: [id number] Patient Account Number: [id number] Date of Birth/Sex: Treating RN: 14-Sep-1923 (89 y.o. [name] Primary Care Lakelynn Severtson: [name] Other Clinician: Referring Emanuel Campos: Treating Jemarcus Dougal/Extender: [name] in Treatment: 0 Wound Status Wound Number: 3 Primary Venous Leg Ulcer Etiology: Wound Location: Left, Posterior Lower Leg Wound Open Wounding Event: Pressure Injury Status: Date Acquired: 12/21/2022 Comorbid Cataracts, Glaucoma, Anemia, Congestive Heart Failure, Coronary [name] Of Treatment: 0 History: Artery Disease, Hypertension, Gout, Osteoarthritis, Confinement Clustered Wound: No Anxiety Photos Wound Measurements Length: (cm) 1.2 Width: (cm) 2.3 Depth: (cm) 0.1 Area: (cm) 2.168 Volume: (cm) 0.217 % Reduction in Area: % Reduction in Volume: Epithelialization: Small (1-33%) Tunneling: No Undermining: No Wound Description Classification: Full Thickness Without Exposed  Support Structures Wound Margin: Flat and Intact Exudate Amount: Medium Exudate Type: Serosanguineous Exudate Color: red, brown Foul Odor After Cleansing: No Slough/Fibrino Yes Wound Bed Granulation Amount: Medium (34-66%) Exposed Structure Granulation Quality: Red Fascia Exposed: No Necrotic Amount: Medium (34-66%) Fat Layer (Subcutaneous Tissue) Exposed: Yes Necrotic Quality: Adherent Slough Tendon Exposed: No Muscle Exposed: No Joint Exposed: No Bone Exposed: No [name], [name] ([id number]) [phone].pdf Page 12 of 13 Periwound Skin Texture Texture Color No Abnormalities Noted: Yes No Abnormalities Noted: No Erythema: Yes Moisture No Abnormalities Noted: Yes Temperature / Pain Temperature: No Abnormality Tenderness on Palpation: Yes Electronic Signature(s) Signed: 12/21/2022 5:03:32 PM By: Zenaida Deed RN, BSN Entered By: Zenaida Deed on 12/21/2022 14:23:24 -------------------------------------------------------------------------------- Wound Assessment Details Patient Name: Date of Service: [facility] [place], Paula [name] 12/21/2022 1:15 PM Medical Record Number: [id number] Patient Account Number: [id number] Date of Birth/Sex: Treating RN: 1923/05/17 (89 y.o. [name] Primary Care Raylee Strehl: [name] Other Clinician: Referring Fama Muenchow: Treating Genette Huertas/Extender: [name] in Treatment: 0 Wound Status Wound Number: 4 Primary Pressure Ulcer Etiology: Wound Location: Left Calcaneus Wound Open Wounding Event: Pressure Injury Status: Date Acquired: 10/24/2022 Comorbid Cataracts, Glaucoma, Anemia, Congestive Heart Failure, Coronary [name] Of Treatment: 0 History: Artery Disease, Hypertension, Gout, Osteoarthritis, Confinement Clustered Wound: No Anxiety Photos Wound Measurements Length: (cm) 0.4 Width: (cm) 0.5 Depth: (cm) 0.1 Area: (cm) 0.157 Volume: (cm) 0.016 % Reduction in Area: % Reduction in  Volume: Epithelialization: Small (1-33%) Tunneling: No Undermining: No Wound Description Classification: Category/Stage III Wound Margin: Distinct, outline attached Exudate Amount: Medium Exudate Type: Serosanguineous Exudate Color: red, brown Foul Odor After Cleansing: No Slough/Fibrino Yes  Wound Bed Granulation Amount: Large (67-100%) Exposed Structure Granulation Quality: Pink, Pale Fascia Exposed: No Necrotic Amount: Small (1-33%) Fat Layer (Subcutaneous Tissue) Exposed: Yes Necrotic Quality: Adherent Slough Tendon Exposed: No Muscle Exposed: No Joint Exposed: No Bone Exposed: No [address] [name], [name] ([id number]) [id number] Page 13 of 13 No Abnormalities Noted: No No Abnormalities Noted: Yes Callus: Yes Temperature / Pain Temperature: No Abnormality Moisture No Abnormalities Noted: No Tenderness on Palpation: Yes Dry / Scaly: Yes Electronic Signature(s) Signed: 12/21/2022 5:03:32 PM By: Zenaida Deed RN, BSN Entered By: Zenaida Deed on 12/21/2022 14:24:03 -------------------------------------------------------------------------------- Vitals Details Patient Name: Date of Service: Paula Jock, Paula [name] 12/21/2022 1:15 PM Medical Record Number: [id number] Patient Account Number: [id number] Date of Birth/Sex: Treating RN: 08-Apr-1923 (89 y.o. [name] Primary Care Kaeleb Emond: [name] Other Clinician: Referring Hira Trent: Treating Dorianna Mckiver/Extender: [name] in Treatment: 0 Vital Signs Time Taken: 13:38 Temperature (F): 97.5 Height (in): 60 Pulse (bpm): 80 Weight (lbs): 130 Respiratory Rate (breaths/min): 18 Body Mass Index (BMI): 25.4 Blood Pressure (mmHg): 139/70 Reference Range: 80 - 120 mg / dl Electronic Signature(s) Signed: 12/21/2022 5:03:32 PM By: Zenaida Deed RN, BSN Entered By: Zenaida Deed on 12/21/2022 13:40:06

## 2023-01-26 NOTE — ED Notes (Addendum)
Warming blanket advanced to high as core temperature dropped to 93.9 rectally.

## 2023-01-27 DIAGNOSIS — N189 Chronic kidney disease, unspecified: Secondary | ICD-10-CM | POA: Diagnosis not present

## 2023-01-27 DIAGNOSIS — N179 Acute kidney failure, unspecified: Secondary | ICD-10-CM | POA: Diagnosis not present

## 2023-01-27 LAB — CBC
HCT: 31.5 % — ABNORMAL LOW (ref 36.0–46.0)
Hemoglobin: 10.2 g/dL — ABNORMAL LOW (ref 12.0–15.0)
MCH: 26.8 pg (ref 26.0–34.0)
MCHC: 32.4 g/dL (ref 30.0–36.0)
MCV: 82.7 fL (ref 80.0–100.0)
Platelets: 146 10*3/uL — ABNORMAL LOW (ref 150–400)
RBC: 3.81 MIL/uL — ABNORMAL LOW (ref 3.87–5.11)
RDW: 17 % — ABNORMAL HIGH (ref 11.5–15.5)
WBC: 12.6 10*3/uL — ABNORMAL HIGH (ref 4.0–10.5)
nRBC: 1.8 % — ABNORMAL HIGH (ref 0.0–0.2)

## 2023-01-27 LAB — BLOOD GAS, VENOUS
Acid-Base Excess: 1.7 mmol/L (ref 0.0–2.0)
Bicarbonate: 27.2 mmol/L (ref 20.0–28.0)
O2 Saturation: 72.7 %
Patient temperature: 37
pCO2, Ven: 45 mmHg (ref 44–60)
pH, Ven: 7.39 (ref 7.25–7.43)
pO2, Ven: 42 mmHg (ref 32–45)

## 2023-01-27 LAB — COMPREHENSIVE METABOLIC PANEL
ALT: 164 U/L — ABNORMAL HIGH (ref 0–44)
AST: 200 U/L — ABNORMAL HIGH (ref 15–41)
Albumin: 2.3 g/dL — ABNORMAL LOW (ref 3.5–5.0)
Alkaline Phosphatase: 139 U/L — ABNORMAL HIGH (ref 38–126)
Anion gap: 9 (ref 5–15)
BUN: 112 mg/dL — ABNORMAL HIGH (ref 8–23)
CO2: 25 mmol/L (ref 22–32)
Calcium: 6.8 mg/dL — ABNORMAL LOW (ref 8.9–10.3)
Chloride: 99 mmol/L (ref 98–111)
Creatinine, Ser: 2.62 mg/dL — ABNORMAL HIGH (ref 0.44–1.00)
GFR, Estimated: 16 mL/min — ABNORMAL LOW (ref >60)
Glucose, Bld: 93 mg/dL (ref 70–99)
Potassium: 4.7 mmol/L (ref 3.5–5.1)
Sodium: 133 mmol/L — ABNORMAL LOW (ref 135–145)
Total Bilirubin: 1 mg/dL (ref 0.3–1.2)
Total Protein: 4.6 g/dL — ABNORMAL LOW (ref 6.5–8.1)

## 2023-01-27 LAB — T4, FREE: Free T4: 1.62 ng/dL — ABNORMAL HIGH (ref 0.61–1.12)

## 2023-01-27 LAB — CULTURE, BLOOD (ROUTINE X 2): Culture: NO GROWTH

## 2023-01-27 LAB — LIPASE, BLOOD: Lipase: 313 U/L — ABNORMAL HIGH (ref 11–51)

## 2023-01-27 LAB — GLUCOSE, CAPILLARY
Glucose-Capillary: 110 mg/dL — ABNORMAL HIGH (ref 70–99)
Glucose-Capillary: 128 mg/dL — ABNORMAL HIGH (ref 70–99)
Glucose-Capillary: 60 mg/dL — ABNORMAL LOW (ref 70–99)

## 2023-01-27 LAB — CORTISOL: Cortisol, Plasma: 14.9 ug/dL

## 2023-01-27 MED ORDER — DEXTROSE-NACL 5-0.9 % IV SOLN: INTRAVENOUS | Status: AC

## 2023-01-27 MED ORDER — LOTEPREDNOL ETABONATE 0.5 % OP SUSP
1.0000 [drp] | Freq: Two times a day (BID) | OPHTHALMIC | Status: DC
  Administered 2023-01-27: 1 [drp] via OPHTHALMIC
  Filled 2023-01-27: qty 5

## 2023-01-27 MED ORDER — DEXTROSE 50 % IV SOLN
12.5000 g | INTRAVENOUS | Status: AC
  Administered 2023-01-27: 12.5 g via INTRAVENOUS
  Filled 2023-01-27: qty 50

## 2023-01-27 MED ORDER — SODIUM CHLORIDE 0.9 % IV SOLN: INTRAVENOUS | Status: DC

## 2023-01-27 NOTE — Progress Notes (Signed)
    Patient Name: [name]           DOB: 28-Jun-1923  MRN: [id number]      Admission Date: 01/25/2023  Attending Provider: Alwyn Ren, MD  Primary Diagnosis: Acute renal failure superimposed on chronic kidney disease   Level of care: Progressive    CROSS COVER NOTE   Date of Service   01/27/2023   [name], 89 y.o. female, was admitted on 01/25/2023 for Acute renal failure superimposed on chronic kidney disease.    HPI/Events of Note   Staff reports hypoglycemic episode, CBG 60.    Patient has poor oral intake and is lethargic. D50 given.  Continuous fluid order will be changed to D5 NS.   Interventions/ Plan   D50 D5NS and CBG Q4        Anthoney Harada, DNP, [employer] [facility] [place]

## 2023-01-27 NOTE — Progress Notes (Signed)
PROGRESS NOTE    [name]  WUJ:[id number] DOB: 14-Sep-1923 DOA: 01/25/2023 PCP: Irena Reichmann, DO   Brief Narrative: 89 year old female lives at home with her family with history of CAD CKD stage IV stroke recent diagnosis of right foot osteomyelitis on Minocycline admitted with nausea decreased appetite and fall at home.  Per family her Lasix dose was increased 2 weeks ago for weight gain and was being treated with minocycline for right foot osteomyelitis.  She then developed diarrhea they called the ID and was told to hold minocycline for 2 days and restart.  Family also was concerned patients abdomen was tender she was found to have elevated lipase with CT evidence of acute pancreatitis. She was found to be in AKI with a creatinine of 2.74 potassium 5.8 she is admitted for the treatment of the same.  Assessment & Plan:   Principal Problem:   Acute renal failure superimposed on chronic kidney disease Active Problems:   History of CVA (cerebrovascular accident)   Hyperkalemia   Coronary artery disease   Hypothermia   Pyogenic inflammation of bone   Uremia   Metabolic acidosis   Elevated transaminase level   Acute pancreatitis   Prolonged QT interval   AKI (acute kidney injury)   #1 AKI on CKD stage IV - likely pre renal-secondary to decreased p.o. intake, diarrhea from antibiotics and diuretics.  Patient admitted with increased lethargy generalized weakness nausea and decreased appetite.  Her creatinine was found to be 2.74 on admission with a baseline of around 1.7.  Her BUN was 128. Continue to hold Lasix and antihypertensives Continue IV fluids carefully Avoid nephrotoxins Her creatinine not much improved today though trending down slowly. check bladder scan Renal ultrasound  NO hydronephrosis Discussed with daughter-patient is DNR Family and patient does not want to undergo dialysis and she is not a candidate for dialysis.  #2 acute pancreatitis with elevated lipase  abdominal tenderness CT evidence of acute pancreatitis.  A limited abdominal ultrasound shows no acute findings. CT abdomen showed cholelithiasis this was not observed and ultrasound of gallbladder.  Nonobstructive nephrolithiasis moderate hepatic steatosis. Continue IV fluids Recheck lipase  #3 right foot osteomyelitis restart minocycline  #4 history of stroke on aspirin and statin Will hold statin due to transaminitis.  #5 history of hypertension-prn hydralazine  #6 hypothermia-follow-up blood cultures TSH-0.137 Check T3-T4 Cortisol pending   #3 right great toe osteomyelitis restart Minocycline  #4 DTI bilateral heels 2 x 2 cm present on admission. Left anterior leg with healing full thickness wound, 4X2X.2cm, 50% red, 50% yellow, mod amt yellow drainage.  Right inner anterior great toe with chronic full thicknesss wound; .8X.8X.2cm, 50% red, 50% yellow, mod amt yellow drainage. Pressure Injury POA: Yes Dressing procedure/placement/frequency: Topical treatment orders provided for bedside nurses to perform as follows: 1. Change dressing to BLE Q M/W/F as follows; cut piece of Aquacel [place] # [phone]) and apply to left anterior leg and right great toe, then cover with foam dressings. Moisten with NS to remove previous dressings. Leave foam cup to left heel and wrap with kerlex and apply compression stocking. 2. Foam dressings to bilat heels, change Q 3 days or PRN soiling. Float heels to reduce pressure  #5 goals of care-discussed with patient's daughter Waynetta Sandy.  CODE STATUS is DNR.  With multiple comorbidities and poor functional status her prognosis is poor. Daughter agreed for palliative care consult  Estimated body mass index is 27.03 kg/m as calculated from the following:   Height as  of this encounter:  (1.499 m).   Weight as of this encounter: 60.7 kg.  DVT prophylaxis: scd Code Status: dnr Family Communication:  dw daughter [employer], retail Disposition Plan:  Status is:  Inpatient Remains inpatient appropriate because: aki   Consultants:  none  Procedures: none Antimicrobials:minocycline  Subjective:  Patient resting in bed Seen by wound care No events overnight  When I called her name and touched her she frowned her forehead  Objective: Vitals:   01/26/23 1607 01/27/23 0000 01/27/23 0419 01/27/23 0422  BP: (!) 141/69 (!) 146/82 (!) 147/86   Pulse: 83 82 77   Resp: (!) Temp: (!) 97.4 F (36.3 C) (!) 97.5 F (36.4 C) (!) 97.5 F (36.4 C)   TempSrc: Oral Oral Axillary   SpO2: 99% 99% 97%   Weight: 61.3 kg   60.7 kg  Height:  (1.499 m)       Intake/Output Summary (Last 24 hours) at 01/27/2023 1033 Last data filed at 01/27/2023 0930 Gross per 24 hour  Intake 649 ml  Output 1100 ml  Net -451 ml    Filed Weights   01/25/23 1858 01/26/23 1607 01/27/23 0422  Weight: 62.1 kg 61.3 kg 60.7 kg    Examination:  General exam: Appears in no acute distress frail elderly female chronically ill-appearing Respiratory system: Clear to auscultation. Respiratory effort normal. Cardiovascular system: S1 & S2 heard, RRR. No JVD, murmurs, rubs, gallops or clicks. No pedal edema. Gastrointestinal system: Abdomen is distended, soft and nontender. No organomegaly or masses felt. Normal bowel sounds heard. Central nervous system: Alert and oriented. Able to answer questions appropriately and follow commands Ext 1+ edema Skin: Multiple skin tears  psychiatry: Judgement and insight appear normal. Mood & affect appropriate.   Data Reviewed: I have personally reviewed following labs and imaging studies  CBC: Recent Labs  Lab 01/25/23 2002 01/26/23 0532 01/27/23 0346  WBC 12.9* 8.7 12.6*  NEUTROABS 11.7*  --   --   HGB 11.9* 10.8* 10.2*  HCT 40.1 35.2* 31.5*  MCV 90.7 86.7 82.7  PLT PLATELET CLUMPS NOTED ON SMEAR, UNABLE TO ESTIMATE 200 146*    Basic Metabolic Panel: Recent Labs  Lab 01/25/23 2002 01/26/23 0532 01/26/23 1633  01/27/23 0346  NA 135 134* 132* 133*  K 5.8* 5.2* 4.8 4.7  CL 108 106 100 99  CO2 14* 17* 21* 25  GLUCOSE 119* 108* 151* 93  BUN 128* 125* 118* 112*  CREATININE 2.74* 2.65* 2.72* 2.62*  CALCIUM 8.5* 7.8* 7.3* 6.8*  MG  --  1.9  --   --   PHOS  --  7.6*  --   --     GFR: Estimated Creatinine Clearance: 9.5 mL/min (A) (by C-G formula based on SCr of 2.62 mg/dL (H)). Liver Function Tests: Recent Labs  Lab 01/25/23 2002 01/26/23 0532 01/26/23 1633 01/27/23 0346  AST 176* 151* 204* 200*  ALT 158* 144* 164* 164*  ALKPHOS 171* 157* 167* 139*  BILITOT 0.8 0.7 0.7 1.0  PROT 6.4* 5.5* 5.5* 4.6*  ALBUMIN 3.1* 2.6* 2.6* 2.3*    Recent Labs  Lab 01/25/23 2002 01/27/23 0346  LIPASE 361* 313*    Recent Labs  Lab 01/25/23 2318  AMMONIA 13    Coagulation Profile: Recent Labs  Lab 01/25/23 2318  INR 1.0    Cardiac Enzymes: No results for input(s): "CKTOTAL", "CKMB", "CKMBINDEX", "TROPONINI" in the last 168 hours. BNP (last 3 results) No results for input(s): "PROBNP"  in the last 8760 hours. HbA1C: No results for input(s): "HGBA1C" in the last 72 hours. CBG: No results for input(s): "GLUCAP" in the last 168 hours. Lipid Profile: Recent Labs    01/26/23 0532  TRIG 41    Thyroid Function Tests: Recent Labs    01/26/23 0532  TSH 0.137*    Anemia Panel: No results for input(s): "VITAMINB12", "FOLATE", "FERRITIN", "TIBC", "IRON", "RETICCTPCT" in the last 72 hours. Sepsis Labs: Recent Labs  Lab 01/25/23 2318  LATICACIDVEN 1.2     Recent Results (from the past 240 hour(s))  Blood culture (routine x 2)     Status: None (Preliminary result)   Collection Time: 01/25/23 11:18 PM   Specimen: BLOOD  Result Value Ref Range Status   Specimen Description   Final    BLOOD SITE NOT SPECIFIED Performed at [facility], [address] [place], [place] [address]    Special Requests   Final    BOTTLES DRAWN AEROBIC AND ANAEROBIC Blood Culture  adequate volume Performed at [facility], [address], [place], [place] [address]    Culture   Final    NO GROWTH 1 DAY Performed at [facility], [address], [place], [place] [address]    Report Status PENDING  Incomplete  Blood culture (routine x 2)     Status: None (Preliminary result)   Collection Time: 01/26/23  5:32 AM   Specimen: BLOOD  Result Value Ref Range Status   Specimen Description   Final    BLOOD RIGHT ANTECUBITAL Performed at [facility], [address] [place], [place] [address]    Special Requests   Final    BOTTLES DRAWN AEROBIC AND ANAEROBIC Blood Culture results may not be optimal due to an excessive volume of blood received in culture bottles Performed at [facility], [address] [place], [place] [address]    Culture   Final    NO GROWTH < 24 HOURS Performed at [facility], [address] [place], [place] [address]    Report Status PENDING  Incomplete         Radiology Studies: US RENAL  Result Date: 01/26/2023 CLINICAL DATA:  89 year old female with acute renal injury. EXAM: RENAL / URINARY TRACT ULTRASOUND COMPLETE COMPARISON:  Noncontrast CT Abdomen and Pelvis yesterday. FINDINGS: Right Kidney: Renal measurements: 7.5 x 4.4 x 4.8 cm = volume: 82 mL. No right hydronephrosis or solid renal mass. Nephrolithiasis better demonstrated by CT. Left Kidney: Renal measurements: 7.0 x 3.6 x 3.0 cm = volume: 41 mL mL. Asymmetrically echogenic left renal cortex. No left hydronephrosis or solid left renal mass. Nephrolithiasis better demonstrated by CT. Bladder: Appears normal for degree of bladder distention. A left ureteral jet is detected with Doppler. Other: None. IMPRESSION: 1. No hydronephrosis or acute renal finding. 2. Asymmetric left renal cortical atrophy, chronic medical renal disease. Electronically Signed   By: Odessa Fleming M.D.   On: 01/26/2023 10:07   US Abdomen Limited RUQ  (LIVER/GB)  Result Date: 01/26/2023 CLINICAL DATA:  Acute pancreatitis, elevated LFTs EXAM: ULTRASOUND ABDOMEN LIMITED RIGHT UPPER QUADRANT COMPARISON:  CT abdomen and pelvis 01/25/2019 FINDINGS: Compromised study due to patient condition, limited mobility, and inability to hold breath. Gallbladder: No gallbladder wall thickening or pericholecystic fluid. The cholelithiasis seen on CT was not well visualized sonographically. Common bile duct: Diameter: 3 mm.  No intrahepatic biliary dilation. Liver: No focal lesion identified. Increased parenchymal echogenicity. Portal  vein is patent on color Doppler imaging with normal direction of blood flow towards the liver. Other: None. IMPRESSION: Suboptimal exam.  No acute abnormality. Electronically Signed   By: Minerva Fester M.D.   On: 01/26/2023 01:59   CT ABDOMEN PELVIS WO CONTRAST  Result Date: 01/25/2023 CLINICAL DATA:  Right lower quadrant pain and early satiety with nausea, vomiting and diarrhea. EXAM: CT ABDOMEN AND PELVIS WITHOUT CONTRAST TECHNIQUE: Multidetector CT imaging of the abdomen and pelvis was performed following the standard protocol without IV contrast. RADIATION DOSE REDUCTION: This exam was performed according to the departmental dose-optimization program which includes automated exposure control, adjustment of the mA and/or kV according to patient size and/or use of iterative reconstruction technique. COMPARISON:  CT without contrast 01/13/2022 FINDINGS: Lower chest: Lung bases again show chronic scarring changes without infiltrates, and chronic eventration along the anterior right diaphragm. There is mild cardiomegaly. There are calcifications in the mitral and aortic annuli and coronary arteries and heavily in the descending aorta. Hepatobiliary: Limited image detail due to respiratory motion, noncontrast technique and artifact from the patient's right arm in the field. The liver is moderately steatotic. No focal abnormality is seen without  contrast, as visualized. There are tiny stones barium in posterior gallbladder without wall thickening or biliary dilatation. Pancreas: No stranding is seen alongside the pancreatic tail consistent with acute pancreatitis. Correlate with lipase clinical symptoms. There is only slight stranding of the pancreatic head. There is no appreciable mass without contrast. There is mild pancreatic atrophy. Spleen: No abnormality is seen through the breathing motion. Adrenals/Urinary Tract: Mild general cortical volume loss both kidneys. Bilateral cysts are again noted largest on the left is 6.5 cm, largest on the right 2.8 cm. There is right extrarenal pelvis. There is a 5 mm nonobstructive caliceal stone in the mid to lower pole left kidney, 7 mm nonobstructive stone in the inferior pole on the right and 1 or 2 punctate nonobstructive caliceal stones in the right upper pole. No hydronephrosis or ureteral stone is seen on either side. The bladder wall and lumen are unremarkable. Stomach/Bowel: No dilatation or wall thickening including the appendix. There is advanced sigmoid diverticulosis without evidence of acute diverticulitis. Vascular/Lymphatic: Heavy aortoiliac calcific plaque with visceral branch vessel atherosclerosis as well particularly the proximal renal arteries, proximal SMA. No AAA. No adenopathy. Reproductive: Status post hysterectomy. No adnexal masses. There are multiple pelvic phleboliths. Other: There are small inguinal fat hernias. There are no incarcerated hernias. There is no free air, free hemorrhage or free fluid including around the pancreas. No abscess or further focal acute process. Musculoskeletal: Moderate chronic compression fracture L1 vertebral body. Chronic degenerative grade 1 anterolisthesis L3-4 and L4-5 with levoscoliosis. Osteopenia and advanced degenerative changes. Severe acquired spinal stenosis L3-4 and L4-5. IMPRESSION: 1. CT findings of acute pancreatitis. Correlate with lipase and  symptoms. 2. Cholelithiasis without evidence of acute cholecystitis. 3. Moderate hepatic steatosis. 4. Nonobstructive nephrolithiasis. 5. Aortic, branch vessel and coronary artery atherosclerosis. 6. Diverticulosis without evidence of diverticulitis. 7. Osteopenia, degenerative and chronic changes. 8. Severe chronic acquired spinal stenosis L3-4 and L4-5. Aortic Atherosclerosis (ICD10-I70.0). Electronically Signed   By: Almira Bar M.D.   On: 01/25/2023 23:32   DG Ankle Complete Right  Result Date: 01/25/2023 CLINICAL DATA:  fall EXAM: RIGHT ANKLE - COMPLETE 3+ VIEW COMPARISON:  X-ray right foot 12/12/2022 FINDINGS: There is no evidence of fracture, dislocation, or joint effusion. Posterior calcaneal spur. There is no evidence of arthropathy or other focal bone abnormality.  Subcutaneus soft tissue edema. Redemonstration of chronic plantar punctate densities that may represent calcifications versus retained radiopaque foreign bodies. IMPRESSION: No acute displaced fracture or dislocation. Electronically Signed   By: Tish Frederickson M.D.   On: 01/25/2023 22:50   DG Chest Portable 1 View  Result Date: 01/25/2023 CLINICAL DATA:  CHF.  Shortness of breath EXAM: PORTABLE CHEST 1 VIEW COMPARISON:  10/27/2022 FINDINGS: Low volume chest with streaky density at the bases. Fullness of the right hilum which is chronic and vascular by recent chest CT. No effusion or pneumothorax. No pulmonary edema or air bronchogram. IMPRESSION: Mild atelectatic type opacity at the bases.  No pulmonary edema. Electronically Signed   By: Tiburcio Pea M.D.   On: 01/25/2023 21:26   CT HEAD WO CONTRAST ( )  Result Date: 01/25/2023 CLINICAL DATA:  Head trauma, minor, normal mental status (Age 79-64y); Neck trauma (Age >= 65y) EXAM: CT HEAD WITHOUT CONTRAST CT CERVICAL SPINE WITHOUT CONTRAST TECHNIQUE: Multidetector CT imaging of the head and cervical spine was performed following the standard protocol without intravenous  contrast. Multiplanar CT image reconstructions of the cervical spine were also generated. RADIATION DOSE REDUCTION: This exam was performed according to the departmental dose-optimization program which includes automated exposure control, adjustment of the mA and/or kV according to patient size and/or use of iterative reconstruction technique. COMPARISON:  CT head 01/22/2022 FINDINGS: CT HEAD FINDINGS Brain: Cerebral ventricle sizes are concordant with the degree of cerebral volume loss. Patchy and confluent areas of decreased attenuation are noted throughout the deep and periventricular white matter of the cerebral hemispheres bilaterally, compatible with chronic microvascular ischemic disease. No evidence of large-territorial acute infarction. No parenchymal hemorrhage. No mass lesion. No extra-axial collection. No mass effect or midline shift. No hydrocephalus. Basilar cisterns are patent. Vascular: No hyperdense vessel. Skull: No acute fracture or focal lesion. Sinuses/Orbits: Paranasal sinuses and mastoid air cells are clear. The orbits are unremarkable. Other: None. CT CERVICAL SPINE FINDINGS Alignment: Normal. Skull base and vertebrae: Diffusely decreased bone density. Multilevel moderate severe degenerative changes of the spine. Multilevel moderate to severe osseous neural foraminal stenosis. No severe osseous central canal stenosis. No acute fracture. No aggressive appearing focal osseous lesion or focal pathologic process. Soft tissues and spinal canal: No prevertebral fluid or swelling. No visible canal hematoma. Upper chest: Unremarkable. Other: Severe atherosclerotic plaque of the carotid arteries within the neck. Retropharyngeal. Carotid arteries. IMPRESSION: 1. No acute intracranial abnormality. 2. No acute displaced fracture or traumatic listhesis of the cervical spine. 3. Multilevel moderate severe degenerative changes of the spine. Multilevel moderate to severe osseous neural foraminal stenosis. 4.  Severe atherosclerotic plaque of the carotid arteries within the neck. Electronically Signed   By: Tish Frederickson M.D.   On: 01/25/2023 19:36   CT Cervical Spine Wo Contrast  Result Date: 01/25/2023 CLINICAL DATA:  Head trauma, minor, normal mental status (Age 58-64y); Neck trauma (Age >= 65y) EXAM: CT HEAD WITHOUT CONTRAST CT CERVICAL SPINE WITHOUT CONTRAST TECHNIQUE: Multidetector CT imaging of the head and cervical spine was performed following the standard protocol without intravenous contrast. Multiplanar CT image reconstructions of the cervical spine were also generated. RADIATION DOSE REDUCTION: This exam was performed according to the departmental dose-optimization program which includes automated exposure control, adjustment of the mA and/or kV according to patient size and/or use of iterative reconstruction technique. COMPARISON:  CT head 01/22/2022 FINDINGS: CT HEAD FINDINGS Brain: Cerebral ventricle sizes are concordant with the degree of cerebral volume loss. Patchy and confluent areas of  decreased attenuation are noted throughout the deep and periventricular white matter of the cerebral hemispheres bilaterally, compatible with chronic microvascular ischemic disease. No evidence of large-territorial acute infarction. No parenchymal hemorrhage. No mass lesion. No extra-axial collection. No mass effect or midline shift. No hydrocephalus. Basilar cisterns are patent. Vascular: No hyperdense vessel. Skull: No acute fracture or focal lesion. Sinuses/Orbits: Paranasal sinuses and mastoid air cells are clear. The orbits are unremarkable. Other: None. CT CERVICAL SPINE FINDINGS Alignment: Normal. Skull base and vertebrae: Diffusely decreased bone density. Multilevel moderate severe degenerative changes of the spine. Multilevel moderate to severe osseous neural foraminal stenosis. No severe osseous central canal stenosis. No acute fracture. No aggressive appearing focal osseous lesion or focal pathologic  process. Soft tissues and spinal canal: No prevertebral fluid or swelling. No visible canal hematoma. Upper chest: Unremarkable. Other: Severe atherosclerotic plaque of the carotid arteries within the neck. Retropharyngeal. Carotid arteries. IMPRESSION: 1. No acute intracranial abnormality. 2. No acute displaced fracture or traumatic listhesis of the cervical spine. 3. Multilevel moderate severe degenerative changes of the spine. Multilevel moderate to severe osseous neural foraminal stenosis. 4. Severe atherosclerotic plaque of the carotid arteries within the neck. Electronically Signed   By: Tish Frederickson M.D.   On: 01/25/2023 19:36        Scheduled Meds:  aspirin  81 mg Oral Daily   brimonidine  1 drop Both Eyes BID   Loteprednol Etabonate  1 drop Right Eye BID   minocycline  200 mg Oral BID   sodium chloride  1 drop Right Eye QID   sodium chloride flush  3 mL Intravenous Q12H   Continuous Infusions:  sodium chloride 75 mL/hr at 01/27/23 1003     LOS: 1 day   39 min Time spent:   Alwyn Ren, MD 01/27/2023, 10:33 AM

## 2023-01-27 NOTE — Plan of Care (Addendum)
Received patient in bed drowsy and open eyes to touch. Patient more alert at lunchtime then more somnolent after 2pm. Remains on RA. No signs of pain.  Family at bedside. Skin care rendered. Safety precautions maintained. Problem: Education: Goal: Knowledge of General Education information will improve Description: Including pain rating scale, medication(s)/side effects and non-pharmacologic comfort measures Outcome: Progressing   Problem: Health Behavior/Discharge Planning: Goal: Ability to manage health-related needs will improve Outcome: Progressing   Problem: Clinical Measurements: Goal: Ability to maintain clinical measurements within normal limits will improve Outcome: Progressing Goal: Will remain free from infection Outcome: Progressing Goal: Diagnostic test results will improve Outcome: Progressing Goal: Respiratory complications will improve Outcome: Progressing Goal: Cardiovascular complication will be avoided Outcome: Progressing   Problem: Activity: Goal: Risk for activity intolerance will decrease Outcome: Progressing   Problem: Nutrition: Goal: Adequate nutrition will be maintained Outcome: Progressing   Problem: Coping: Goal: Level of anxiety will decrease Outcome: Progressing   Problem: Elimination: Goal: Will not experience complications related to bowel motility Outcome: Progressing Goal: Will not experience complications related to urinary retention Outcome: Progressing   Problem: Pain Managment: Goal: General experience of comfort will improve Outcome: Progressing   Problem: Safety: Goal: Ability to remain free from injury will improve Outcome: Progressing   Problem: Skin Integrity: Goal: Risk for impaired skin integrity will decrease Outcome: Progressing

## 2023-01-27 NOTE — Consult Note (Signed)
WOC Nurse Consult Note: Reason for Consult: Consult requested for BLE. Pt has been followed by the outpatient wound care center prior to admission and they have ordered Hydroferra blue.  Informed daughter at the bedside that [facility] does not carry this product in our formulary, and we will substitute a similar product, Aquacel, while in the hospital, and she is in agreement. Daughter states wounds have greatly improved, but patient has chronic osteomyelitis to right great toe which is being treated with antibiotics.  Wound type: Bilat heels with dark purple Deep tissue pressure injuries; each side is .2X.2cm Left anterior leg with healing full thickness wound, 4X2X.2cm, 50% red, 50% yellow, mod amt yellow drainage.  Right inner anterior great toe with chronic full thicknesss wound; .8X.8X.2cm, 50% red, 50% yellow, mod amt yellow drainage. Pressure Injury POA: Yes Dressing procedure/placement/frequency: Topical treatment orders provided for bedside nurses to perform as follows: 1. Change dressing to BLE Q M/W/F as follows; cut piece of Aquacel [place] # [phone]) and apply to left anterior leg and right great toe, then cover with foam dressings. Moisten with NS to remove previous dressings. Leave foam cup to left heel and wrap with kerlex and apply compression stocking. 2. Foam dressings to bilat heels, change Q 3 days or PRN soiling. Float heels to reduce pressure. Pt should resume previous plan of care and followup with the outpatient wound care center after discharge.  Please re-consult if further assistance is needed.  Thank-you,  Cammie Mcgee MSN, RN, CWOCN, [place], CNS [phone]

## 2023-01-27 NOTE — Telephone Encounter (Signed)
Please see the MyChart message reply(ies) for my assessment and plan.    This patient gave consent for this Medical Advice Message and is aware that it may result in a bill to [employer], as well as the possibility of receiving a bill for a co-payment or deductible. They are an established patient, but are not seeking medical advice exclusively about a problem treated during an in person or video visit in the last seven days. I did not recommend an in person or video visit within seven days of my reply.    I spent a total of 6 minutes cumulative time within 7 days through [employer].   Problem List Items Addressed This Visit   None Visit Diagnoses     Nausea and vomiting, unspecified vomiting type    -  Primary   Relevant Medications   metoCLOPramide (REGLAN) 5 MG tablet       Rexene Alberts, NP

## 2023-01-28 DIAGNOSIS — Z7189 Other specified counseling: Secondary | ICD-10-CM | POA: Diagnosis not present

## 2023-01-28 DIAGNOSIS — Z515 Encounter for palliative care: Secondary | ICD-10-CM

## 2023-01-28 DIAGNOSIS — R531 Weakness: Secondary | ICD-10-CM

## 2023-01-28 DIAGNOSIS — N189 Chronic kidney disease, unspecified: Secondary | ICD-10-CM | POA: Diagnosis not present

## 2023-01-28 DIAGNOSIS — N179 Acute kidney failure, unspecified: Secondary | ICD-10-CM | POA: Diagnosis not present

## 2023-01-28 LAB — COMPREHENSIVE METABOLIC PANEL
ALT: 171 U/L — ABNORMAL HIGH (ref 0–44)
AST: 203 U/L — ABNORMAL HIGH (ref 15–41)
Albumin: 2.1 g/dL — ABNORMAL LOW (ref 3.5–5.0)
Alkaline Phosphatase: 158 U/L — ABNORMAL HIGH (ref 38–126)
Anion gap: 10 (ref 5–15)
BUN: 100 mg/dL — ABNORMAL HIGH (ref 8–23)
CO2: 22 mmol/L (ref 22–32)
Calcium: 6.7 mg/dL — ABNORMAL LOW (ref 8.9–10.3)
Chloride: 106 mmol/L (ref 98–111)
Creatinine, Ser: 2.41 mg/dL — ABNORMAL HIGH (ref 0.44–1.00)
GFR, Estimated: 18 mL/min — ABNORMAL LOW (ref >60)
Glucose, Bld: 101 mg/dL — ABNORMAL HIGH (ref 70–99)
Potassium: 5 mmol/L (ref 3.5–5.1)
Sodium: 138 mmol/L (ref 135–145)
Total Bilirubin: 0.9 mg/dL (ref 0.3–1.2)
Total Protein: 4.5 g/dL — ABNORMAL LOW (ref 6.5–8.1)

## 2023-01-28 LAB — CBC
HCT: 32.3 % — ABNORMAL LOW (ref 36.0–46.0)
Hemoglobin: 10 g/dL — ABNORMAL LOW (ref 12.0–15.0)
MCH: 26.5 pg (ref 26.0–34.0)
MCHC: 31 g/dL (ref 30.0–36.0)
MCV: 85.4 fL (ref 80.0–100.0)
Platelets: 119 10*3/uL — ABNORMAL LOW (ref 150–400)
RBC: 3.78 MIL/uL — ABNORMAL LOW (ref 3.87–5.11)
RDW: 17.6 % — ABNORMAL HIGH (ref 11.5–15.5)
WBC: 17.5 10*3/uL — ABNORMAL HIGH (ref 4.0–10.5)
nRBC: 0.7 % — ABNORMAL HIGH (ref 0.0–0.2)

## 2023-01-28 LAB — GLUCOSE, CAPILLARY: Glucose-Capillary: 103 mg/dL — ABNORMAL HIGH (ref 70–99)

## 2023-01-28 NOTE — Discharge Summary (Signed)
Physician Discharge Summary  [name] ZOX:[id number] DOB: 01/30/1923 DOA: 01/25/2023  PCP: Irena Reichmann, DO  Admit date: 01/25/2023 Discharge date: 01/28/2023  Admitted From: Home Disposition: Residential hospice  Home Health none Equipment/Devices none Discharge Condition: Hospice CODE STATUS: DNR Diet recommendation: Regular soft Brief/Interim Summary:  89 year old female lives at home with her family with history of CAD CKD stage IV stroke recent diagnosis of right foot osteomyelitis on Minocycline admitted with nausea decreased appetite and fall at home.  Per family her Lasix dose was increased 2 weeks ago for weight gain and was being treated with minocycline for right foot osteomyelitis.  She then developed diarrhea they called the ID and was told to hold minocycline for 2 days and restart.  Family also was concerned patients abdomen was tender she was found to have elevated lipase with CT evidence of acute pancreatitis. She was found to be in AKI with a creatinine of 2.74 potassium 5.8 she was admitted for the treatment of the same. Discharge Diagnoses:  Principal Problem:   Acute renal failure superimposed on chronic kidney disease Active Problems:   History of CVA (cerebrovascular accident)   Hyperkalemia   Coronary artery disease   Hypothermia   Pyogenic inflammation of bone   Uremia   Metabolic acidosis   Elevated transaminase level   Acute pancreatitis   Prolonged QT interval   AKI (acute kidney injury)    #1 AKI on CKD stage IV -Discussed with daughter-patient is DNR Family and patient does not want to undergo dialysis and she is not a candidate for dialysis. Renal functions did not improve with IV hydration.  Palliative care was consulted at this point.  Family elected for patient to be kept comfortable and going to residential hospice.  Family also reported that she opened her eyes last night and told him goodbye and then closed her eyes and never spoke again.    #2 acute pancreatitis with elevated lipase abdominal tenderness CT evidence of acute pancreatitis.  A limited abdominal ultrasound shows no acute findings. CT abdomen showed cholelithiasis this was not observed and ultrasound of gallbladder.  Nonobstructive nephrolithiasis moderate hepatic steatosis.   #3 right foot osteomyelitis was on minocycline   #4 history of stroke was  on aspirin and statin   #5 history of hypertension-prn hydralazine   #6 hypothermia-follow-up blood cultures   #4 DTI bilateral heels 2 x 2 cm present on admission. Left anterior leg with healing full thickness wound, 4X2X.2cm, 50% red, 50% yellow, mod amt yellow drainage.  Right inner anterior great toe with chronic full thicknesss wound; .8X.8X.2cm, 50% red, 50% yellow, mod amt yellow drainage. Pressure Injury POA: Yes Dressing procedure/placement/frequency: Topical treatment orders provided for bedside nurses to perform as follows: 1. Change dressing to BLE Q M/W/F as follows; cut piece of Aquacel [place] # [phone]) and apply to left anterior leg and right great toe, then cover with foam dressings. Moisten with NS to remove previous dressings. Leave foam cup to left heel and wrap with kerlex and apply compression stocking. 2. Foam dressings to bilat heels, change Q 3 days or PRN soiling. Float heels to reduce pressure   #5 goals of care-discussed with patient's daughter [name]  CODE STATUS is DNR.  With multiple comorbidities and poor functional status her prognosis is poor. Daughter agreed for palliative care consult Appreciate palliative care input family has decided to change her to DNR and comfort care and agreed for residential hospice. She will be discharged to [facility].  Pressure Injury 01/27/23 Heel Right;Left Deep Tissue Pressure Injury - Purple or maroon localized area of discolored intact skin or blood-filled blister due to damage of underlying soft tissue from pressure and/or shear. (Active)   01/27/23   Location: Heel  Location Orientation: Right;Left  Staging: Deep Tissue Pressure Injury - Purple or maroon localized area of discolored intact skin or blood-filled blister due to damage of underlying soft tissue from pressure and/or shear.  Wound Description (Comments):   Present on Admission: Yes      Estimated body mass index is 28.36 kg/m as calculated from the following:   Height as of this encounter: 4\' 11"  (1.499 m).   Weight as of this encounter: 63.7 kg.  Discharge Instructions  Discharge Instructions     Diet - low sodium heart healthy   Complete by: As directed    Increase activity slowly   Complete by: As directed    No wound care   Complete by: As directed       Allergies as of 01/28/2023       Reactions   Meloxicam Hives   Guaifenesin Er Rash   Nitrofuran Derivatives Rash   Nitrofurantoin Rash        Medication List     STOP taking these medications    Acetaminophen 325 MG Caps   amLODipine 2.5 MG tablet Commonly known as: NORVASC   aspirin 81 MG chewable tablet   atorvastatin 80 MG tablet Commonly known as: LIPITOR   benzonatate 100 MG capsule Commonly known as: TESSALON   CALTRATE 600+D PO   camphor-menthol lotion Commonly known as: SARNA   chlorpheniramine 4 MG tablet Commonly known as: CHLOR-TRIMETON   Cranberry Extract 250 MG Tabs   Cyanocobalamin 1000 MCG Tbcr   diclofenac Sodium 1 % Gel Commonly known as: VOLTAREN   famotidine 20 MG tablet Commonly known as: PEPCID   furosemide 40 MG tablet Commonly known as: LASIX   Gemtesa 75 MG Tabs Generic drug: Vibegron   metoCLOPramide 5 MG tablet Commonly known as: Reglan   metoprolol tartrate 25 MG tablet Commonly known as: LOPRESSOR   metoprolol tartrate 50 MG tablet Commonly known as: LOPRESSOR   minocycline 100 MG capsule Commonly known as: MINOCIN   potassium chloride SA 20 MEQ tablet Commonly known as: KLOR-CON M   Prolia 60 MG/ML Sosy  injection Generic drug: denosumab   SYSTANE OP       TAKE these medications    brimonidine 0.2 % ophthalmic solution Commonly known as: ALPHAGAN Place 1 drop into both eyes in the morning and at bedtime.   Lotemax SM 0.38 % Gel Generic drug: Loteprednol Etabonate Place 1 drop into the right eye 2 (two) times daily.   sodium chloride 5 % ophthalmic solution Commonly known as: MURO 128 INSTILL 1 DROP INTO RIGHT EYE 4 TIMES A DAY What changed:  See the new instructions. Another medication with the same name was removed. Continue taking this medication, and follow the directions you see here.   timolol 0.5 % ophthalmic solution Commonly known as: TIMOPTIC Place 1 drop into both eyes every morning.        Allergies  Allergen Reactions   Meloxicam Hives   Guaifenesin Er Rash   Nitrofuran Derivatives Rash   Nitrofurantoin Rash    Consultations: palliative   Procedures/Studies: US RENAL  Result Date: 01/26/2023 CLINICAL DATA:  89 year old female with acute renal injury. EXAM: RENAL / URINARY TRACT ULTRASOUND COMPLETE COMPARISON:  Noncontrast CT Abdomen and Pelvis yesterday.  FINDINGS: Right Kidney: Renal measurements: 7.5 x 4.4 x 4.8 cm = volume: 82 mL. No right hydronephrosis or solid renal mass. Nephrolithiasis better demonstrated by CT. Left Kidney: Renal measurements: 7.0 x 3.6 x 3.0 cm = volume: 41 mL mL. Asymmetrically echogenic left renal cortex. No left hydronephrosis or solid left renal mass. Nephrolithiasis better demonstrated by CT. Bladder: Appears normal for degree of bladder distention. A left ureteral jet is detected with Doppler. Other: None. IMPRESSION: 1. No hydronephrosis or acute renal finding. 2. Asymmetric left renal cortical atrophy, chronic medical renal disease. Electronically Signed   By: Odessa Fleming M.D.   On: 01/26/2023 10:07   US Abdomen Limited RUQ (LIVER/GB)  Result Date: 01/26/2023 CLINICAL DATA:  Acute pancreatitis, elevated LFTs EXAM:  ULTRASOUND ABDOMEN LIMITED RIGHT UPPER QUADRANT COMPARISON:  CT abdomen and pelvis 01/25/2019 FINDINGS: Compromised study due to patient condition, limited mobility, and inability to hold breath. Gallbladder: No gallbladder wall thickening or pericholecystic fluid. The cholelithiasis seen on CT was not well visualized sonographically. Common bile duct: Diameter: 3 mm.  No intrahepatic biliary dilation. Liver: No focal lesion identified. Increased parenchymal echogenicity. Portal vein is patent on color Doppler imaging with normal direction of blood flow towards the liver. Other: None. IMPRESSION: Suboptimal exam.  No acute abnormality. Electronically Signed   By: Minerva Fester M.D.   On: 01/26/2023 01:59   CT ABDOMEN PELVIS WO CONTRAST  Result Date: 01/25/2023 CLINICAL DATA:  Right lower quadrant pain and early satiety with nausea, vomiting and diarrhea. EXAM: CT ABDOMEN AND PELVIS WITHOUT CONTRAST TECHNIQUE: Multidetector CT imaging of the abdomen and pelvis was performed following the standard protocol without IV contrast. RADIATION DOSE REDUCTION: This exam was performed according to the departmental dose-optimization program which includes automated exposure control, adjustment of the mA and/or kV according to patient size and/or use of iterative reconstruction technique. COMPARISON:  CT without contrast 01/13/2022 FINDINGS: Lower chest: Lung bases again show chronic scarring changes without infiltrates, and chronic eventration along the anterior right diaphragm. There is mild cardiomegaly. There are calcifications in the mitral and aortic annuli and coronary arteries and heavily in the descending aorta. Hepatobiliary: Limited image detail due to respiratory motion, noncontrast technique and artifact from the patient's right arm in the field. The liver is moderately steatotic. No focal abnormality is seen without contrast, as visualized. There are tiny stones barium in posterior gallbladder without wall  thickening or biliary dilatation. Pancreas: No stranding is seen alongside the pancreatic tail consistent with acute pancreatitis. Correlate with lipase clinical symptoms. There is only slight stranding of the pancreatic head. There is no appreciable mass without contrast. There is mild pancreatic atrophy. Spleen: No abnormality is seen through the breathing motion. Adrenals/Urinary Tract: Mild general cortical volume loss both kidneys. Bilateral cysts are again noted largest on the left is 6.5 cm, largest on the right 2.8 cm. There is right extrarenal pelvis. There is a 5 mm nonobstructive caliceal stone in the mid to lower pole left kidney, 7 mm nonobstructive stone in the inferior pole on the right and 1 or 2 punctate nonobstructive caliceal stones in the right upper pole. No hydronephrosis or ureteral stone is seen on either side. The bladder wall and lumen are unremarkable. Stomach/Bowel: No dilatation or wall thickening including the appendix. There is advanced sigmoid diverticulosis without evidence of acute diverticulitis. Vascular/Lymphatic: Heavy aortoiliac calcific plaque with visceral branch vessel atherosclerosis as well particularly the proximal renal arteries, proximal SMA. No AAA. No adenopathy. Reproductive: Status post hysterectomy. No  adnexal masses. There are multiple pelvic phleboliths. Other: There are small inguinal fat hernias. There are no incarcerated hernias. There is no free air, free hemorrhage or free fluid including around the pancreas. No abscess or further focal acute process. Musculoskeletal: Moderate chronic compression fracture L1 vertebral body. Chronic degenerative grade 1 anterolisthesis L3-4 and L4-5 with levoscoliosis. Osteopenia and advanced degenerative changes. Severe acquired spinal stenosis L3-4 and L4-5. IMPRESSION: 1. CT findings of acute pancreatitis. Correlate with lipase and symptoms. 2. Cholelithiasis without evidence of acute cholecystitis. 3. Moderate hepatic  steatosis. 4. Nonobstructive nephrolithiasis. 5. Aortic, branch vessel and coronary artery atherosclerosis. 6. Diverticulosis without evidence of diverticulitis. 7. Osteopenia, degenerative and chronic changes. 8. Severe chronic acquired spinal stenosis L3-4 and L4-5. Aortic Atherosclerosis (ICD10-I70.0). Electronically Signed   By: Almira Bar M.D.   On: 01/25/2023 23:32   DG Ankle Complete Right  Result Date: 01/25/2023 CLINICAL DATA:  fall EXAM: RIGHT ANKLE - COMPLETE 3+ VIEW COMPARISON:  X-ray right foot 12/12/2022 FINDINGS: There is no evidence of fracture, dislocation, or joint effusion. Posterior calcaneal spur. There is no evidence of arthropathy or other focal bone abnormality. Subcutaneus soft tissue edema. Redemonstration of chronic plantar punctate densities that may represent calcifications versus retained radiopaque foreign bodies. IMPRESSION: No acute displaced fracture or dislocation. Electronically Signed   By: Tish Frederickson M.D.   On: 01/25/2023 22:50   DG Chest Portable 1 View  Result Date: 01/25/2023 CLINICAL DATA:  CHF.  Shortness of breath EXAM: PORTABLE CHEST 1 VIEW COMPARISON:  10/27/2022 FINDINGS: Low volume chest with streaky density at the bases. Fullness of the right hilum which is chronic and vascular by recent chest CT. No effusion or pneumothorax. No pulmonary edema or air bronchogram. IMPRESSION: Mild atelectatic type opacity at the bases.  No pulmonary edema. Electronically Signed   By: Tiburcio Pea M.D.   On: 01/25/2023 21:26   CT HEAD WO CONTRAST ( )  Result Date: 01/25/2023 CLINICAL DATA:  Head trauma, minor, normal mental status (Age 21-64y); Neck trauma (Age >= 65y) EXAM: CT HEAD WITHOUT CONTRAST CT CERVICAL SPINE WITHOUT CONTRAST TECHNIQUE: Multidetector CT imaging of the head and cervical spine was performed following the standard protocol without intravenous contrast. Multiplanar CT image reconstructions of the cervical spine were also generated.  RADIATION DOSE REDUCTION: This exam was performed according to the departmental dose-optimization program which includes automated exposure control, adjustment of the mA and/or kV according to patient size and/or use of iterative reconstruction technique. COMPARISON:  CT head 01/22/2022 FINDINGS: CT HEAD FINDINGS Brain: Cerebral ventricle sizes are concordant with the degree of cerebral volume loss. Patchy and confluent areas of decreased attenuation are noted throughout the deep and periventricular white matter of the cerebral hemispheres bilaterally, compatible with chronic microvascular ischemic disease. No evidence of large-territorial acute infarction. No parenchymal hemorrhage. No mass lesion. No extra-axial collection. No mass effect or midline shift. No hydrocephalus. Basilar cisterns are patent. Vascular: No hyperdense vessel. Skull: No acute fracture or focal lesion. Sinuses/Orbits: Paranasal sinuses and mastoid air cells are clear. The orbits are unremarkable. Other: None. CT CERVICAL SPINE FINDINGS Alignment: Normal. Skull base and vertebrae: Diffusely decreased bone density. Multilevel moderate severe degenerative changes of the spine. Multilevel moderate to severe osseous neural foraminal stenosis. No severe osseous central canal stenosis. No acute fracture. No aggressive appearing focal osseous lesion or focal pathologic process. Soft tissues and spinal canal: No prevertebral fluid or swelling. No visible canal hematoma. Upper chest: Unremarkable. Other: Severe atherosclerotic plaque of the carotid arteries  within the neck. Retropharyngeal. Carotid arteries. IMPRESSION: 1. No acute intracranial abnormality. 2. No acute displaced fracture or traumatic listhesis of the cervical spine. 3. Multilevel moderate severe degenerative changes of the spine. Multilevel moderate to severe osseous neural foraminal stenosis. 4. Severe atherosclerotic plaque of the carotid arteries within the neck. Electronically  Signed   By: Tish Frederickson M.D.   On: 01/25/2023 19:36   CT Cervical Spine Wo Contrast  Result Date: 01/25/2023 CLINICAL DATA:  Head trauma, minor, normal mental status (Age 40-64y); Neck trauma (Age >= 65y) EXAM: CT HEAD WITHOUT CONTRAST CT CERVICAL SPINE WITHOUT CONTRAST TECHNIQUE: Multidetector CT imaging of the head and cervical spine was performed following the standard protocol without intravenous contrast. Multiplanar CT image reconstructions of the cervical spine were also generated. RADIATION DOSE REDUCTION: This exam was performed according to the departmental dose-optimization program which includes automated exposure control, adjustment of the mA and/or kV according to patient size and/or use of iterative reconstruction technique. COMPARISON:  CT head 01/22/2022 FINDINGS: CT HEAD FINDINGS Brain: Cerebral ventricle sizes are concordant with the degree of cerebral volume loss. Patchy and confluent areas of decreased attenuation are noted throughout the deep and periventricular white matter of the cerebral hemispheres bilaterally, compatible with chronic microvascular ischemic disease. No evidence of large-territorial acute infarction. No parenchymal hemorrhage. No mass lesion. No extra-axial collection. No mass effect or midline shift. No hydrocephalus. Basilar cisterns are patent. Vascular: No hyperdense vessel. Skull: No acute fracture or focal lesion. Sinuses/Orbits: Paranasal sinuses and mastoid air cells are clear. The orbits are unremarkable. Other: None. CT CERVICAL SPINE FINDINGS Alignment: Normal. Skull base and vertebrae: Diffusely decreased bone density. Multilevel moderate severe degenerative changes of the spine. Multilevel moderate to severe osseous neural foraminal stenosis. No severe osseous central canal stenosis. No acute fracture. No aggressive appearing focal osseous lesion or focal pathologic process. Soft tissues and spinal canal: No prevertebral fluid or swelling. No visible  canal hematoma. Upper chest: Unremarkable. Other: Severe atherosclerotic plaque of the carotid arteries within the neck. Retropharyngeal. Carotid arteries. IMPRESSION: 1. No acute intracranial abnormality. 2. No acute displaced fracture or traumatic listhesis of the cervical spine. 3. Multilevel moderate severe degenerative changes of the spine. Multilevel moderate to severe osseous neural foraminal stenosis. 4. Severe atherosclerotic plaque of the carotid arteries within the neck. Electronically Signed   By: Tish Frederickson M.D.   On: 01/25/2023 19:36   (Echo, Carotid, EGD, Colonoscopy, ERCP)    Subjective: Not responding to verbal stimuli or tactile stimuli does not respond to questions or follow commands  Discharge Exam: Vitals:   01/28/23 0411 01/28/23 1437  BP: (!) 141/67 (!) 126/96  Pulse: 83 (!) 109  Resp: 20 20  Temp: 98.7 F (37.1 C) 97.8 F (36.6 C)  SpO2: 94% 93%   Vitals:   01/27/23 1355 01/27/23 2027 01/28/23 0411 01/28/23 1437  BP: 130/66 135/66 (!) 141/67 (!) 126/96  Pulse: 72 96 83 (!) 109  Resp: Temp: 98.2 F (36.8 C) 97.8 F (36.6 C) 98.7 F (37.1 C) 97.8 F (36.6 C)  TempSrc: Skin Oral Oral Oral  SpO2: 97% 92% 94% 93%  Weight:   63.7 kg   Height:        General: Pt is unresponsive does not follow any commands or answer questions Cardiovascular: RRR, S1/S2 +, no rubs, no gallops Respiratory: CTA bilaterally, no wheezing, no rhonchi Abdominal: Soft, NT, ND, bowel sounds + Extremities: 1+ edema  The results of significant diagnostics  from this hospitalization (including imaging, microbiology, ancillary and laboratory) are listed below for reference.     Microbiology: Recent Results (from the past 240 hour(s))  Blood culture (routine x 2)     Status: None (Preliminary result)   Collection Time: 01/25/23 11:18 PM   Specimen: BLOOD  Result Value Ref Range Status   Specimen Description   Final    BLOOD SITE NOT SPECIFIED Performed at [facility], [address], [place], [place] [address]    Special Requests   Final    BOTTLES DRAWN AEROBIC AND ANAEROBIC Blood Culture adequate volume Performed at [facility], [address], [place], [place] [address]    Culture   Final    NO GROWTH 2 DAYS Performed at [facility], [address], [place], [place] [address]    Report Status PENDING  Incomplete  Blood culture (routine x 2)     Status: None (Preliminary result)   Collection Time: 01/26/23  5:32 AM   Specimen: BLOOD  Result Value Ref Range Status   Specimen Description   Final    BLOOD RIGHT ANTECUBITAL Performed at [facility], [address] [place], [place] [address]    Special Requests   Final    BOTTLES DRAWN AEROBIC AND ANAEROBIC Blood Culture results may not be optimal due to an excessive volume of blood received in culture bottles Performed at [facility], [address] [place], [place] [address]    Culture   Final    NO GROWTH 2 DAYS Performed at [facility], [address], [place], [place] [address]    Report Status PENDING  Incomplete     Labs: BNP (last 3 results) Recent Labs    10/20/22 1658 10/27/22 0953 01/25/23 2239  BNP 580.4* 699.9* 258.9*   Basic Metabolic Panel: Recent Labs  Lab 01/25/23 2002 01/26/23 0532 01/26/23 1633 01/27/23 0346 01/28/23 0427  NA 135 134* 132* 133* 138  K 5.8* 5.2* 4.8 4.7 5.0  CL 108 106 100 99 106  CO2 14* 17* 21* 25 22  GLUCOSE 119* 108* 151* 93 101*  BUN 128* 125* 118* 112* 100*  CREATININE 2.74* 2.65* 2.72* 2.62* 2.41*  CALCIUM 8.5* 7.8* 7.3* 6.8* 6.7*  MG  --  1.9  --   --   --   PHOS  --  7.6*  --   --   --    Liver Function Tests: Recent Labs  Lab 01/25/23 2002 01/26/23 0532 01/26/23 1633 01/27/23 0346 01/28/23 0427  AST 176* 151* 204* 200* 203*  ALT 158* 144* 164* 164* 171*  ALKPHOS 171* 157* 167* 139* 158*  BILITOT 0.8 0.7 0.7 1.0 0.9   PROT 6.4* 5.5* 5.5* 4.6* 4.5*  ALBUMIN 3.1* 2.6* 2.6* 2.3* 2.1*   Recent Labs  Lab 01/25/23 2002 01/27/23 0346  LIPASE 361* 313*   Recent Labs  Lab 01/25/23 2318  AMMONIA 13   CBC: Recent Labs  Lab 01/25/23 2002 01/26/23 0532 01/27/23 0346 01/28/23 0427  WBC 12.9* 8.7 12.6* 17.5*  NEUTROABS 11.7*  --   --   --   HGB 11.9* 10.8* 10.2* 10.0*  HCT 40.1 35.2* 31.5* 32.3*  MCV 90.7 86.7 82.7 85.4  PLT PLATELET CLUMPS NOTED ON SMEAR, UNABLE TO ESTIMATE 200 146* 119*   Cardiac Enzymes: No results for input(s): "CKTOTAL", "CKMB", "CKMBINDEX", "TROPONINI" in the last 168 hours. BNP: Invalid input(s): "POCBNP" CBG: Recent Labs  Lab  01/27/23 2144 01/27/23 2225 01/27/23 2356 01/28/23 0408  GLUCAP 60* 128* 110* 103*   D-Dimer No results for input(s): "DDIMER" in the last 72 hours. Hgb A1c No results for input(s): "HGBA1C" in the last 72 hours. Lipid Profile Recent Labs    01/26/23 0532  TRIG 41   Thyroid function studies Recent Labs    01/26/23 0532  TSH 0.137*   Anemia work up No results for input(s): "VITAMINB12", "FOLATE", "FERRITIN", "TIBC", "IRON", "RETICCTPCT" in the last 72 hours. Urinalysis    Component Value Date/Time   COLORURINE YELLOW 01/25/2023 1914   APPEARANCEUR HAZY (A) 01/25/2023 1914   LABSPEC 1.010 01/25/2023 1914   PHURINE 5.0 01/25/2023 1914   GLUCOSEU NEGATIVE 01/25/2023 1914   HGBUR SMALL (A) 01/25/2023 1914   BILIRUBINUR NEGATIVE 01/25/2023 1914   KETONESUR NEGATIVE 01/25/2023 1914   PROTEINUR NEGATIVE 01/25/2023 1914   UROBILINOGEN 0.2 09/21/2012 1355   NITRITE NEGATIVE 01/25/2023 1914   LEUKOCYTESUR MODERATE (A) 01/25/2023 1914   Sepsis Labs Recent Labs  Lab 01/25/23 2002 01/26/23 0532 01/27/23 0346 01/28/23 0427  WBC 12.9* 8.7 12.6* 17.5*   Microbiology Recent Results (from the past 240 hour(s))  Blood culture (routine x 2)     Status: None (Preliminary result)   Collection Time: 01/25/23 11:18 PM   Specimen:  BLOOD  Result Value Ref Range Status   Specimen Description   Final    BLOOD SITE NOT SPECIFIED Performed at [facility], [address], [place], [place] [address]    Special Requests   Final    BOTTLES DRAWN AEROBIC AND ANAEROBIC Blood Culture adequate volume Performed at [facility], [address], [place], [place] [address]    Culture   Final    NO GROWTH 2 DAYS Performed at [facility], [address], [place], [place] [address]    Report Status PENDING  Incomplete  Blood culture (routine x 2)     Status: None (Preliminary result)   Collection Time: 01/26/23  5:32 AM   Specimen: BLOOD  Result Value Ref Range Status   Specimen Description   Final    BLOOD RIGHT ANTECUBITAL Performed at [facility], [address], [place], [place] [address]    Special Requests   Final    BOTTLES DRAWN AEROBIC AND ANAEROBIC Blood Culture results may not be optimal due to an excessive volume of blood received in culture bottles Performed at [facility], [address] [place], [place] [address]    Culture   Final    NO GROWTH 2 DAYS Performed at [facility], [address] [place], [place] [address]    Report Status PENDING  Incomplete     Time coordinating discharge: 39 minutes  SIGNED: Alwyn Ren, MD  [facility] 01/28/2023, 4:37 PM

## 2023-01-28 NOTE — Plan of Care (Signed)

## 2023-01-28 NOTE — Progress Notes (Signed)
RN able to provide report to [facility] at this time. Await transportation

## 2023-01-28 NOTE — Progress Notes (Addendum)
Daughter is at bedside with patient. Daughter has requested that staff not stick for IV or Q4 CBG at time. Patient has many bruises and tears on upper extremities. Daughter stated this was discussed with MD this morning. Patient rests in bed comfortably.

## 2023-01-28 NOTE — TOC Transition Note (Signed)
Transition of Care [facility]) - CM/SW Discharge Note   Patient Details  Name: [name] MRN: [id number] Date of Birth: 1923-02-15  Transition of Care [facility]) CM/SW Contact:  Georgie Chard, LCSW Phone Number: 01/28/2023, 4:40 PM   Clinical Narrative:     Patient has a bed at [facility], Patient will DC to [facility] today.         Patient Goals and CMS Choice      Discharge Placement                         Discharge Plan and Services Additional resources added to the After Visit Summary for                                       Social Determinants of Health (SDOH) Interventions SDOH Screenings   Food Insecurity: No Food Insecurity (01/26/2023)  Housing: Low Risk  (01/26/2023)  Transportation Needs: No Transportation Needs (01/26/2023)  Utilities: Not At Risk (01/26/2023)  Depression (PHQ2-9): Low Risk  (01/12/2023)  Financial Resource Strain: Low Risk  (01/18/2023)  Physical Activity: Inactive (12/26/2022)  Tobacco Use: Low Risk  (01/26/2023)     Readmission Risk Interventions    01/17/2022    3:24 PM  Readmission Risk Prevention Plan  Transportation Screening Complete  PCP or Specialist Appt within 3-5 Days Complete  Palliative Care Screening Not Applicable

## 2023-01-28 NOTE — Progress Notes (Addendum)
Patient was picked up by [facility] to go to [facility]. Family at the bedside. Family knows and is aware that patient is going to [facility]. Discharge paperwork was completed and report was given to [facility] about patient during day shift. Patient left stable and had discharge paperwork.

## 2023-01-28 NOTE — Progress Notes (Signed)
[facility]) Hospice hospital liaison note   Referral received for [facility]. Talked with family members by phone to discuss hospice services and philosophy. All questions were answered as appropriate.     [facility] is able to offer a bed tonight and transport has been called.    RN staff, you may call report at any time to [phone], room is assigned when report is called.  Please leave IV intact.    Please send completed DNR with patient.   Updated attending and [facility] manager via [employer]. Thank you for the opportunity to participate in this patient's care Thea Gist, BSN, RN Hospice nurse liaison [phone]

## 2023-01-28 NOTE — Consult Note (Signed)
Consultation Note Date: 01/28/2023   Patient Name: [name]  DOB: July 12, 1923  MRN: [id number]  Age / Sex: 89 y.o., female  PCP: Irena Reichmann, DO Referring Physician: Alwyn Ren, MD  Reason for Consultation: Establishing goals of care  HPI/Patient Profile: 89 y.o. female admitted on 01/25/2023 .  89 year old female lives at home with her family with history of CAD CKD stage IV stroke recent diagnosis of right foot osteomyelitis on Minocycline admitted with nausea decreased appetite and fall at home.  Per family her Lasix dose was increased 2 weeks ago for weight gain and was being treated with minocycline for right foot osteomyelitis.  She then developed diarrhea they called the ID and was told to hold minocycline for 2 days and restart.  Family also was concerned patients abdomen was tender she was found to have elevated lipase with CT evidence of acute pancreatitis. She was found to be in AKI with a creatinine of 2.74 potassium 5.8  Clinical Assessment and Goals of Care: Patient remains admitted to hospital medicine service for acute kidney injury, underlying stage IV chronic kidney disease, right foot osteomyelitis, acute pancreatitis. Patient has a had ongoing decline, lack of meaningful recovery.  Patient's family elected for DO NOT RESUSCITATE.  Patient's family elected for not undergoing dialysis. Patient's family elected for not having the patient undergo repeat IV placement or PICC or central line. Palliative consult called. Patient seen and examined.  Discussed with various family members including 2 daughters, including HCPOA daughter Waynetta Sandy. Palliative medicine is specialized medical care for people living with serious illness. It focuses on providing relief from the symptoms and stress of a serious illness. The goal is to improve quality of life for both the patient and the family. Goals of  care: Broad aims of medical therapy in relation to the patient's values and preferences. Our aim is to provide medical care aimed at enabling patients to achieve the goals that matter most to them, given the circumstances of their particular medical situation and their constraints.    HCPOA Daughter Beth  SUMMARY OF RECOMMENDATIONS   Family meeting: Met with patient's 2 daughters, granddaughters and son-in-law.  Patient's minister also visited.  Introduced scope of palliative services.  Differences between hospice and palliative explained.  I discussed about comfort measures and aggressive symptom management at end-of-life.  Patient's family is tearful but requests for comfort care and hence we discussed about residential hospice options.  Patient's family is familiar with [facility].  Will notify TOC.  Code Status/Advance Care Planning: DNR   Symptom Management:    Palliative Prophylaxis:  Delirium Protocol  Additional Recommendations (Limitations, Scope, Preferences): Full Comfort Care  Psycho-social/Spiritual:  Desire for further Chaplaincy support:yes Additional Recommendations: Education on Hospice  Prognosis:  < 2 weeks  Discharge Planning: Hospice facility      Primary Diagnoses: Present on Admission:  Acute renal failure superimposed on chronic kidney disease  Uremia  Pyogenic inflammation of bone  Hypothermia  Hyperkalemia  Coronary artery disease  Metabolic acidosis  Elevated transaminase level  Acute pancreatitis  Prolonged QT interval  AKI (acute kidney injury)   I have reviewed the medical record, interviewed the patient and family, and examined the patient. The following aspects are pertinent.  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    Fall at home 09/26/2012   Glaucoma    OU   Gout, joint    Hypertension    Hypertensive retinopathy    OU    Macular degeneration    OU   NSTEMI (non-ST elevated myocardial infarction) 08/2015   Stroke    Social History   Socioeconomic History   Marital status: Divorced    Spouse name: Not on file   Number of children: 3   Years of education: 12   Highest education level: Not on file  Occupational History    Comment: retired  Tobacco Use   Smoking status: Never   Smokeless tobacco: Never  Vaping Use   Vaping Use: Never used  Substance and Sexual Activity   Alcohol use: No    Alcohol/week: 0.0 standard drinks of alcohol   Drug use: No   Sexual activity: Never    Birth control/protection: Post-menopausal  Other Topics Concern   Not on file  Social History Narrative   Patient is single and lives at home alone.   Retired   Education 12th grade    Right handed   Caffeine  sometimes   Social Determinants of Corporate investment banker Strain: Low Risk  (01/18/2023)   Overall Financial Resource Strain (CARDIA)    Difficulty of Paying Living Expenses: Not very hard  Food Insecurity: No Food Insecurity (01/26/2023)   Hunger Vital Sign    Worried About Running Out of Food in the Last Year: Never true    Ran Out of Food in the Last Year: Never true  Transportation Needs: No Transportation Needs (01/26/2023)   PRAPARE - Administrator, Civil Service (Medical): No    Lack of Transportation (Non-Medical): No  Physical Activity: Inactive (12/26/2022)   Exercise Vital Sign    Days of Exercise per Week: 0 days    Minutes of Exercise per Session: 0 min  Stress: Not on file  Social Connections: Not on file   Family History  Problem Relation Age of Onset   Heart disease Mother    Stroke Mother    Scheduled Meds:  aspirin  81 mg Oral Daily   brimonidine  1 drop Both Eyes BID   loteprednol  1 drop Right Eye BID   minocycline  200 mg Oral BID   sodium chloride  1 drop Right Eye QID   sodium chloride flush  3 mL Intravenous Q12H   Continuous Infusions: PRN  Meds:.acetaminophen **OR** acetaminophen, benzonatate Medications Prior to Admission:  Prior to Admission medications   Medication Sig Start Date End Date Taking? Authorizing Provider  Acetaminophen 325 MG CAPS Take 325 mg by mouth in the morning and at bedtime.   Yes [provider]  amLODipine (NORVASC) 2.5 MG tablet Take 1 tablet (2.5 mg total) by mouth daily. 10/31/22  Yes Rodolph Bong, MD  aspirin 81 MG chewable tablet Chew 81 mg by mouth daily.   Yes [provider]  atorvastatin (LIPITOR) 80 MG tablet Take 1 tablet (80 mg total) by mouth daily at 6 PM. 08/20/15  Yes Ghimire, Werner Lean, MD  benzonatate (TESSALON) 100 MG capsule Take 1 capsule (100 mg total) by mouth 3 (three) times daily as needed for cough. Patient taking differently: Take 100 mg by mouth as needed for cough. 10/31/22  Yes Rodolph Bong, MD  brimonidine (ALPHAGAN) 0.2 % ophthalmic solution Place 1 drop into both eyes in the morning and at bedtime.   Yes [provider]  Calcium Carbonate-Vitamin D (CALTRATE 600+D PO) Take 1 tablet by mouth daily. gummy   Yes [provider]  chlorpheniramine (CHLOR-TRIMETON) 4 MG tablet Take 4 mg by mouth daily.   Yes [provider]  Cranberry Extract 250 MG TABS Take 250 mg by mouth daily.   Yes [provider]  [employer] SOD CHLORIDE HYPERTONICITY 5 % ophthalmic ointment PLACE 1 APPLICATION INTO THE RIGHT EYE AT BEDTIME. Patient taking differently: Place 1 Application into the right eye at bedtime. 07/01/22  Yes Rennis Chris, MD  Cyanocobalamin 1000 MCG TBCR Take 1,000 mcg by mouth daily.   Yes [provider]  denosumab (PROLIA) 60 MG/ML SOSY injection Inject 60 mg into the skin every 6 (six) months. 12/18/17  Yes [provider]  diclofenac Sodium (VOLTAREN) 1 % GEL Apply 2 g topically 4 (four) times daily. Apply to Knee Patient taking differently: Apply 1 Application topically in the morning and at bedtime.  Apply to Knee 10/31/22  Yes Rodolph Bong, MD  famotidine (PEPCID) 20 MG tablet Take 20 mg by mouth daily.   Yes [provider]  furosemide (LASIX) 40 MG tablet Take 20 mg by mouth 3 (three) times a week. MONDAY , [facility] and FRIDAY 12/24/21  Yes [provider]  GEMTESA 75 MG TABS Take 75 mg by mouth daily. 10/15/21  Yes [provider]  Loteprednol Etabonate (LOTEMAX SM) 0.38 % GEL Place 1 drop into the right eye 2 (two) times daily. 07/01/22  Yes Rennis Chris, MD  metoprolol tartrate (LOPRESSOR) 50 MG tablet Take 50 mg by mouth 2 (two) times daily.   Yes [provider]  minocycline (MINOCIN) 100 MG capsule Take 2 capsules (200 mg total) by mouth 2 (two) times daily. 01/12/23 02/23/23 Yes Kathlynn Grate, DO  Polyethyl Glycol-Propyl Glycol (SYSTANE OP) Place 1 drop into both eyes as needed (dry eyes).   Yes [provider]  sodium chloride (MURO 128) 5 % ophthalmic solution INSTILL 1 DROP INTO RIGHT EYE 4 TIMES A DAY Patient taking differently: Place 1 drop into the right eye in the morning, at noon, in the evening, and at bedtime. 08/17/22  Yes Rennis Chris, MD  timolol (TIMOPTIC) 0.5 % ophthalmic solution Place 1 drop into both eyes every morning. 11/01/21  Yes [provider]  camphor-menthol Wynelle Fanny) lotion Apply topically as needed for itching. Patient not taking: Reported on 01/26/2023 10/31/22   Rodolph Bong, MD  metoCLOPramide (REGLAN) 5 MG tablet Take 1 tablet (5 mg total) by mouth 2 (two) times daily as needed for nausea. Patient not taking: Reported on 01/26/2023 01/25/23   Blanchard Kelch, NP  metoprolol tartrate (LOPRESSOR) 25 MG tablet Take 0.5 tablets (12.5 mg total) by mouth 2 (two) times daily. Patient not taking: Reported on 01/26/2023 10/31/22   Rodolph Bong, MD  potassium chloride SA (KLOR-CON M) 20 MEQ tablet Take 1 tablet (20 mEq total) by mouth daily. Patient not taking: Reported on 01/26/2023 01/11/23   Ronney Asters, NP   Allergies  Allergen Reactions   Meloxicam Hives   Guaifenesin Er Rash  Nitrofuran Derivatives Rash   Nitrofurantoin Rash   Review of Systems weakness Physical Exam Elderly, chronically ill-appearing Resting in bed eyes closed opens eyes to voice command Shallow regular breath sounds S1-S2 Abdomen is distended Multiple skin tears  Vital Signs: BP (!) 141/67 (BP Location: Right Arm)   Pulse 83   Temp 98.7 F (37.1 C) (Oral)   Resp 20   Ht  (1.499 m)   Wt 63.7 kg   SpO2 94%   BMI 28.36 kg/m  Pain Scale: PAINAD   Pain Score: 0-No pain   SpO2: SpO2: 94 % O2 Device:SpO2: 94 % O2 Flow Rate: .   IO: Intake/output summary:  Intake/Output Summary (Last 24 hours) at 01/28/2023 1302 Last data filed at 01/28/2023 0850 Gross per 24 hour  Intake 1071.52 ml  Output 300 ml  Net 771.52 ml    LBM: Last BM Date : 01/26/23 Baseline Weight: Weight: 62.1 kg Most recent weight: Weight: 63.7 kg     Palliative Assessment/Data:   PPS 30%  Time In: 12 Time Out:  1300 Time Total:  60 Greater than 50%  of this time was spent counseling and coordinating care related to the above assessment and plan.  Signed by: Rosalin Hawking, MD   Please contact Palliative Medicine Team phone at [phone] for questions and concerns.  For individual provider: See Loretha Stapler

## 2023-01-28 NOTE — Progress Notes (Signed)
PROGRESS NOTE    [name]  WUJ:[id number] DOB: 06-08-1923 DOA: 01/25/2023 PCP: Irena Reichmann, DO   Brief Narrative: 89 year old female lives at home with her family with history of CAD CKD stage IV stroke recent diagnosis of right foot osteomyelitis on Minocycline admitted with nausea decreased appetite and fall at home.  Per family her Lasix dose was increased 2 weeks ago for weight gain and was being treated with minocycline for right foot osteomyelitis.  She then developed diarrhea they called the ID and was told to hold minocycline for 2 days and restart.  Family also was concerned patients abdomen was tender she was found to have elevated lipase with CT evidence of acute pancreatitis. She was found to be in AKI with a creatinine of 2.74 potassium 5.8 she is admitted for the treatment of the same.  Assessment & Plan:   Principal Problem:   Acute renal failure superimposed on chronic kidney disease Active Problems:   History of CVA (cerebrovascular accident)   Hyperkalemia   Coronary artery disease   Hypothermia   Pyogenic inflammation of bone   Uremia   Metabolic acidosis   Elevated transaminase level   Acute pancreatitis   Prolonged QT interval   AKI (acute kidney injury)   #1 AKI on CKD stage IV -Discussed with daughter-patient is DNR Family and patient does not want to undergo dialysis and she is not a candidate for dialysis.  #2 acute pancreatitis with elevated lipase abdominal tenderness CT evidence of acute pancreatitis.  A limited abdominal ultrasound shows no acute findings. CT abdomen showed cholelithiasis this was not observed and ultrasound of gallbladder.  Nonobstructive nephrolithiasis moderate hepatic steatosis.  #3 right foot osteomyelitis was on minocycline  #4 history of stroke was  on aspirin and statin  #5 history of hypertension-prn hydralazine  #6 hypothermia-follow-up blood cultures  #4 DTI bilateral heels 2 x 2 cm present on admission. Left  anterior leg with healing full thickness wound, 4X2X.2cm, 50% red, 50% yellow, mod amt yellow drainage.  Right inner anterior great toe with chronic full thicknesss wound; .8X.8X.2cm, 50% red, 50% yellow, mod amt yellow drainage. Pressure Injury POA: Yes Dressing procedure/placement/frequency: Topical treatment orders provided for bedside nurses to perform as follows: 1. Change dressing to BLE Q M/W/F as follows; cut piece of Aquacel [place] # [id number]) and apply to left anterior leg and right great toe, then cover with foam dressings. Moisten with NS to remove previous dressings. Leave foam cup to left heel and wrap with kerlex and apply compression stocking. 2. Foam dressings to bilat heels, change Q 3 days or PRN soiling. Float heels to reduce pressure  #5 goals of care-discussed with patient's daughter [name]  CODE STATUS is DNR.  With multiple comorbidities and poor functional status her prognosis is poor. Daughter agreed for palliative care consult Appreciate palliative care input family has decided to change her to DNR and comfort care and agreed for residential hospice. TOC aware.  Estimated body mass index is 28.36 kg/m as calculated from the following:   Height as of this encounter:  (1.499 m).   Weight as of this encounter: 63.7 kg.  Subjective: Daughter and son-in-law in the room daughter stayed overnight they reported that patient woke up at night and told him goodbye and went off to sleep and did not speak anything after that patient appears comfortable Objective: Vitals:   01/27/23 0422 01/27/23 1355 01/27/23 2027 01/28/23 0411  BP:  130/66 135/66 (!) 141/67  Pulse:  72 96 83  Resp:  16 16 20   Temp:  98.2 F (36.8 C) 97.8 F (36.6 C) 98.7 F (37.1 C)  TempSrc:  Skin Oral Oral  SpO2:  97% 92% 94%  Weight: 60.7 kg   63.7 kg  Height:        Intake/Output Summary (Last 24 hours) at 01/28/2023 1403 Last data filed at 01/28/2023 0850 Gross per 24 hour  Intake 1071.52 ml   Output 300 ml  Net 771.52 ml    Filed Weights   01/26/23 1607 01/27/23 0422 01/28/23 0411  Weight: 61.3 kg 60.7 kg 63.7 kg    Examination:  General exam: Appears in no acute distress frail elderly female chronically ill-appearing Respiratory system: Clear to auscultation. Respiratory effort normal. Cardiovascular system: S1 & S2 heard, RRR. No JVD, murmurs, rubs, gallops or clicks. No pedal edema. Gastrointestinal system: Abdomen is distended, soft and nontender. No organomegaly or masses felt. Normal bowel sounds heard. Central nervous system: Does not respond to commands or questions asked does not follow commands Able to answer questions appropriately and follow commands Ext 1+ edema Skin: Multiple skin tears  Data Reviewed: I have personally reviewed following labs and imaging studies  CBC: Recent Labs  Lab 01/25/23 2002 01/26/23 0532 01/27/23 0346 01/28/23 0427  WBC 12.9* 8.7 12.6* 17.5*  NEUTROABS 11.7*  --   --   --   HGB 11.9* 10.8* 10.2* 10.0*  HCT 40.1 35.2* 31.5* 32.3*  MCV 90.7 86.7 82.7 85.4  PLT PLATELET CLUMPS NOTED ON SMEAR, UNABLE TO ESTIMATE 200 146* 119*    Basic Metabolic Panel: Recent Labs  Lab 01/25/23 2002 01/26/23 0532 01/26/23 1633 01/27/23 0346 01/28/23 0427  NA 135 134* 132* 133* 138  K 5.8* 5.2* 4.8 4.7 5.0  CL 108 106 100 99 106  CO2 14* 17* 21* 25 22  GLUCOSE 119* 108* 151* 93 101*  BUN 128* 125* 118* 112* 100*  CREATININE 2.74* 2.65* 2.72* 2.62* 2.41*  CALCIUM 8.5* 7.8* 7.3* 6.8* 6.7*  MG  --  1.9  --   --   --   PHOS  --  7.6*  --   --   --     GFR: Estimated Creatinine Clearance: 10.6 mL/min (A) (by C-G formula based on SCr of 2.41 mg/dL (H)). Liver Function Tests: Recent Labs  Lab 01/25/23 2002 01/26/23 0532 01/26/23 1633 01/27/23 0346 01/28/23 0427  AST 176* 151* 204* 200* 203*  ALT 158* 144* 164* 164* 171*  ALKPHOS 171* 157* 167* 139* 158*  BILITOT 0.8 0.7 0.7 1.0 0.9  PROT 6.4* 5.5* 5.5* 4.6* 4.5*  ALBUMIN  3.1* 2.6* 2.6* 2.3* 2.1*    Recent Labs  Lab 01/25/23 2002 01/27/23 0346  LIPASE 361* 313*    Recent Labs  Lab 01/25/23 2318  AMMONIA 13    Coagulation Profile: Recent Labs  Lab 01/25/23 2318  INR 1.0    Cardiac Enzymes: No results for input(s): "CKTOTAL", "CKMB", "CKMBINDEX", "TROPONINI" in the last 168 hours. BNP (last 3 results) No results for input(s): "PROBNP" in the last 8760 hours. HbA1C: No results for input(s): "HGBA1C" in the last 72 hours. CBG: Recent Labs  Lab 01/27/23 2144 01/27/23 2225 01/27/23 2356 01/28/23 0408  GLUCAP 60* 128* 110* 103*   Lipid Profile: Recent Labs    01/26/23 0532  TRIG 41    Thyroid Function Tests: Recent Labs    01/26/23 0532 01/27/23 1142  TSH 0.137*  --   FREET4  --  1.62*  Anemia Panel: No results for input(s): "VITAMINB12", "FOLATE", "FERRITIN", "TIBC", "IRON", "RETICCTPCT" in the last 72 hours. Sepsis Labs: Recent Labs  Lab 01/25/23 2318  LATICACIDVEN 1.2     Recent Results (from the past 240 hour(s))  Blood culture (routine x 2)     Status: None (Preliminary result)   Collection Time: 01/25/23 11:18 PM   Specimen: BLOOD  Result Value Ref Range Status   Specimen Description   Final    BLOOD SITE NOT SPECIFIED Performed at [facility], [address], [place], [place] [address]    Special Requests   Final    BOTTLES DRAWN AEROBIC AND ANAEROBIC Blood Culture adequate volume Performed at [facility], [address], [place], [place] [address]    Culture   Final    NO GROWTH 2 DAYS Performed at [facility], [address] [place], [place] [address]    Report Status PENDING  Incomplete  Blood culture (routine x 2)     Status: None (Preliminary result)   Collection Time: 01/26/23  5:32 AM   Specimen: BLOOD  Result Value Ref Range Status   Specimen Description   Final    BLOOD RIGHT ANTECUBITAL Performed at [facility], [address] [place], [place] [address]    Special Requests   Final    BOTTLES DRAWN AEROBIC AND ANAEROBIC Blood Culture results may not be optimal due to an excessive volume of blood received in culture bottles Performed at [facility], [address] [place], [place] [address]    Culture   Final    NO GROWTH 2 DAYS Performed at [facility], [address], [place], [place] [address]    Report Status PENDING  Incomplete         Radiology Studies: No results found.      Scheduled Meds:  aspirin  81 mg Oral Daily   brimonidine  1 drop Both Eyes BID   loteprednol  1 drop Right Eye BID   minocycline  200 mg Oral BID   sodium chloride  1 drop Right Eye QID   sodium chloride flush  3 mL Intravenous Q12H   Continuous Infusions:     LOS: 2 days   39 min Time spent:   Alwyn Ren, MD 01/28/2023, 2:03 PM

## 2023-01-28 NOTE — TOC Progression Note (Signed)
Transition of Care [facility]) - Progression Note    Patient Details  Name: [name] MRN: [id number] Date of Birth: 09-Sep-1923  Transition of Care [facility]) CM/SW Contact  Georgie Chard, [place] Phone Number: 01/28/2023, 3:28 PM  Clinical Narrative:    CSW spoke to the daughter who states that they are working with [facility] and at this time there are no TOC needs. CSW has advised provider if TOC needs arise please put in new consult. TOC will sign off at this time.         Expected Discharge Plan and Services                                               Social Determinants of Health (SDOH) Interventions SDOH Screenings   Food Insecurity: No Food Insecurity (01/26/2023)  Housing: Low Risk  (01/26/2023)  Transportation Needs: No Transportation Needs (01/26/2023)  Utilities: Not At Risk (01/26/2023)  Depression (PHQ2-9): Low Risk  (01/12/2023)  Financial Resource Strain: Low Risk  (01/18/2023)  Physical Activity: Inactive (12/26/2022)  Tobacco Use: Low Risk  (01/26/2023)    Readmission Risk Interventions    01/17/2022    3:24 PM  Readmission Risk Prevention Plan  Transportation Screening Complete  PCP or Specialist Appt within 3-5 Days Complete  Palliative Care Screening Not Applicable

## 2023-01-28 NOTE — Plan of Care (Signed)
  Problem: Coping: Goal: Level of anxiety will decrease 01/28/2023 2128 by Brandon Melnick, RN Outcome: Completed/Met 01/28/2023 2127 by Brandon Melnick, RN Outcome: Adequate for Discharge   Problem: Elimination: Goal: Will not experience complications related to bowel motility 01/28/2023 2128 by Brandon Melnick, RN Outcome: Completed/Met 01/28/2023 2127 by Brandon Melnick, RN Outcome: Adequate for Discharge Goal: Will not experience complications related to urinary retention 01/28/2023 2128 by Brandon Melnick, RN Outcome: Completed/Met 01/28/2023 2127 by Brandon Melnick, RN Outcome: Adequate for Discharge   Problem: Pain Managment: Goal: General experience of comfort will improve 01/28/2023 2128 by Brandon Melnick, RN Outcome: Completed/Met 01/28/2023 2127 by Brandon Melnick, RN Outcome: Adequate for Discharge   Problem: Safety: Goal: Ability to remain free from injury will improve 01/28/2023 2128 by Brandon Melnick, RN Outcome: Completed/Met 01/28/2023 2127 by Brandon Melnick, RN Outcome: Adequate for Discharge   Problem: Skin Integrity: Goal: Risk for impaired skin integrity will decrease 01/28/2023 2128 by Brandon Melnick, RN Outcome: Completed/Met 01/28/2023 2127 by Brandon Melnick, RN Outcome: Adequate for Discharge

## 2023-01-29 ENCOUNTER — Encounter: Payer: Self-pay | Admitting: Podiatry

## 2023-01-29 LAB — CULTURE, BLOOD (ROUTINE X 2): Special Requests: ADEQUATE

## 2023-01-30 ENCOUNTER — Encounter: Payer: Self-pay | Admitting: Internal Medicine

## 2023-01-30 LAB — CULTURE, BLOOD (ROUTINE X 2): Culture: NO GROWTH

## 2023-01-31 ENCOUNTER — Encounter (HOSPITAL_BASED_OUTPATIENT_CLINIC_OR_DEPARTMENT_OTHER): Payer: Medicare Other | Admitting: Internal Medicine

## 2023-01-31 LAB — CULTURE, BLOOD (ROUTINE X 2)

## 2023-02-02 ENCOUNTER — Encounter (INDEPENDENT_AMBULATORY_CARE_PROVIDER_SITE_OTHER): Payer: Medicare Other | Admitting: Ophthalmology

## 2023-02-07 ENCOUNTER — Encounter (INDEPENDENT_AMBULATORY_CARE_PROVIDER_SITE_OTHER): Payer: Medicare Other | Admitting: Ophthalmology

## 2023-02-07 DIAGNOSIS — H34831 Tributary (branch) retinal vein occlusion, right eye, with macular edema: Secondary | ICD-10-CM

## 2023-02-07 DIAGNOSIS — H401133 Primary open-angle glaucoma, bilateral, severe stage: Secondary | ICD-10-CM

## 2023-02-07 DIAGNOSIS — H353122 Nonexudative age-related macular degeneration, left eye, intermediate dry stage: Secondary | ICD-10-CM

## 2023-02-07 DIAGNOSIS — Z961 Presence of intraocular lens: Secondary | ICD-10-CM

## 2023-02-07 DIAGNOSIS — H1820 Unspecified corneal edema: Secondary | ICD-10-CM

## 2023-02-07 DIAGNOSIS — H353211 Exudative age-related macular degeneration, right eye, with active choroidal neovascularization: Secondary | ICD-10-CM

## 2023-02-07 DIAGNOSIS — H35033 Hypertensive retinopathy, bilateral: Secondary | ICD-10-CM

## 2023-02-07 DIAGNOSIS — H04123 Dry eye syndrome of bilateral lacrimal glands: Secondary | ICD-10-CM

## 2023-02-07 DIAGNOSIS — H4311 Vitreous hemorrhage, right eye: Secondary | ICD-10-CM

## 2023-02-07 DIAGNOSIS — I10 Essential (primary) hypertension: Secondary | ICD-10-CM

## 2023-02-08 ENCOUNTER — Encounter: Payer: Medicare Other | Admitting: *Deleted

## 2023-02-08 DEATH — deceased

## 2023-02-09 ENCOUNTER — Ambulatory Visit: Payer: Medicare Other | Admitting: Internal Medicine

## 2023-05-29 ENCOUNTER — Ambulatory Visit: Payer: Medicare Other | Admitting: Cardiology

## 2023-10-09 ENCOUNTER — Ambulatory Visit: Payer: Medicare Other | Admitting: Podiatry
